# Patient Record
Sex: Male | Born: 1945 | Race: White | Hispanic: No | Smoking: Former smoker
Health system: Southern US, Community
[De-identification: ages and names within clinical notes are randomized; demographics above are authoritative.]

## PROBLEM LIST (undated history)

## (undated) DIAGNOSIS — M4802 Spinal stenosis, cervical region: Secondary | ICD-10-CM

## (undated) DIAGNOSIS — I4891 Unspecified atrial fibrillation: Secondary | ICD-10-CM

## (undated) DIAGNOSIS — I10 Essential (primary) hypertension: Secondary | ICD-10-CM

## (undated) DIAGNOSIS — M5136 Other intervertebral disc degeneration, lumbar region: Secondary | ICD-10-CM

## (undated) DIAGNOSIS — I739 Peripheral vascular disease, unspecified: Secondary | ICD-10-CM

## (undated) DIAGNOSIS — R0600 Dyspnea, unspecified: Secondary | ICD-10-CM

## (undated) DIAGNOSIS — E119 Type 2 diabetes mellitus without complications: Secondary | ICD-10-CM

## (undated) DIAGNOSIS — M5030 Other cervical disc degeneration, unspecified cervical region: Secondary | ICD-10-CM

## (undated) DIAGNOSIS — E785 Hyperlipidemia, unspecified: Secondary | ICD-10-CM

## (undated) DIAGNOSIS — M51369 Other intervertebral disc degeneration, lumbar region without mention of lumbar back pain or lower extremity pain: Secondary | ICD-10-CM

## (undated) HISTORY — DX: Spinal stenosis, cervical region: M48.02

## (undated) HISTORY — DX: Unspecified atrial fibrillation: I48.91

## (undated) HISTORY — DX: Hyperlipidemia, unspecified: E78.5

## (undated) HISTORY — PX: NOSE SURGERY: SHX723

## (undated) HISTORY — DX: Peripheral vascular disease, unspecified: I73.9

## (undated) HISTORY — DX: Other cervical disc degeneration, unspecified cervical region: M50.30

## (undated) HISTORY — DX: Other intervertebral disc degeneration, lumbar region without mention of lumbar back pain or lower extremity pain: M51.369

## (undated) HISTORY — DX: Essential (primary) hypertension: I10

## (undated) HISTORY — DX: Other intervertebral disc degeneration, lumbar region: M51.36

## (undated) HISTORY — DX: Type 2 diabetes mellitus without complications: E11.9

## (undated) HISTORY — PX: KNEE SURGERY: SHX244

---

## 2006-10-24 ENCOUNTER — Ambulatory Visit: Payer: Self-pay | Admitting: Orthopedic Surgery

## 2006-10-31 ENCOUNTER — Ambulatory Visit (HOSPITAL_COMMUNITY): Admission: RE | Admit: 2006-10-31 | Discharge: 2006-10-31 | Payer: Self-pay | Admitting: Orthopedic Surgery

## 2006-12-01 ENCOUNTER — Ambulatory Visit: Payer: Self-pay | Admitting: Orthopedic Surgery

## 2014-05-14 ENCOUNTER — Telehealth: Payer: Self-pay | Admitting: Family Medicine

## 2014-05-15 NOTE — Telephone Encounter (Signed)
Patient was on med for acid reflux, cholesterol, diabetes, xanax, percocet 10/325,- patient has not had any meds for over 2 years- He has just got out of prison and needs a family provider and patient notified that we will not prescribe any pain medications and that we would refer to pain management and not given him any pain medications. Patient understands.

## 2014-06-19 ENCOUNTER — Encounter (INDEPENDENT_AMBULATORY_CARE_PROVIDER_SITE_OTHER): Payer: Self-pay

## 2014-06-19 ENCOUNTER — Encounter: Payer: Self-pay | Admitting: Family Medicine

## 2014-06-19 ENCOUNTER — Ambulatory Visit (INDEPENDENT_AMBULATORY_CARE_PROVIDER_SITE_OTHER): Payer: Commercial Managed Care - HMO

## 2014-06-19 ENCOUNTER — Ambulatory Visit (INDEPENDENT_AMBULATORY_CARE_PROVIDER_SITE_OTHER): Payer: Commercial Managed Care - HMO | Admitting: Family Medicine

## 2014-06-19 VITALS — BP 139/69 | HR 66 | Temp 96.8°F | Ht 69.0 in | Wt 233.0 lb

## 2014-06-19 DIAGNOSIS — M545 Low back pain: Secondary | ICD-10-CM | POA: Diagnosis not present

## 2014-06-19 DIAGNOSIS — M542 Cervicalgia: Secondary | ICD-10-CM | POA: Diagnosis not present

## 2014-06-19 DIAGNOSIS — R05 Cough: Secondary | ICD-10-CM | POA: Diagnosis not present

## 2014-06-19 DIAGNOSIS — M549 Dorsalgia, unspecified: Secondary | ICD-10-CM | POA: Insufficient documentation

## 2014-06-19 DIAGNOSIS — Z23 Encounter for immunization: Secondary | ICD-10-CM | POA: Diagnosis not present

## 2014-06-19 DIAGNOSIS — E1140 Type 2 diabetes mellitus with diabetic neuropathy, unspecified: Secondary | ICD-10-CM | POA: Diagnosis not present

## 2014-06-19 DIAGNOSIS — G609 Hereditary and idiopathic neuropathy, unspecified: Secondary | ICD-10-CM | POA: Insufficient documentation

## 2014-06-19 DIAGNOSIS — R059 Cough, unspecified: Secondary | ICD-10-CM

## 2014-06-19 DIAGNOSIS — E119 Type 2 diabetes mellitus without complications: Secondary | ICD-10-CM

## 2014-06-19 LAB — POCT CBC
Granulocyte percent: 53.5 %G (ref 37–80)
HCT, POC: 43.5 % (ref 43.5–53.7)
Hemoglobin: 14.4 g/dL (ref 14.1–18.1)
Lymph, poc: 3.2 (ref 0.6–3.4)
MCH, POC: 29.6 pg (ref 27–31.2)
MCHC: 33.2 g/dL (ref 31.8–35.4)
MCV: 89.2 fL (ref 80–97)
MPV: 7.6 fL (ref 0–99.8)
POC Granulocyte: 4.1 (ref 2–6.9)
POC LYMPH PERCENT: 41.3 %L (ref 10–50)
Platelet Count, POC: 184 10*3/uL (ref 142–424)
RBC: 4.9 M/uL (ref 4.69–6.13)
RDW, POC: 13 %
WBC: 7.7 10*3/uL (ref 4.6–10.2)

## 2014-06-19 LAB — POCT GLYCOSYLATED HEMOGLOBIN (HGB A1C): Hemoglobin A1C: 11.1

## 2014-06-19 LAB — GLUCOSE, POCT (MANUAL RESULT ENTRY): POC Glucose: 285 mg/dl — AB (ref 70–99)

## 2014-06-19 MED ORDER — GABAPENTIN 300 MG PO CAPS: 300.0000 mg | ORAL_CAPSULE | Freq: Three times a day (TID) | ORAL | Status: DC

## 2014-06-19 MED ORDER — OXYCODONE HCL 5 MG PO TABS: 5.0000 mg | ORAL_TABLET | Freq: Two times a day (BID) | ORAL | Status: DC | PRN

## 2014-06-19 MED ORDER — OMEPRAZOLE 20 MG PO CPDR: 20.0000 mg | DELAYED_RELEASE_CAPSULE | Freq: Every day | ORAL | Status: DC

## 2014-06-19 NOTE — Progress Notes (Signed)
   Subjective:    Patient ID: Todd Zhang, male    DOB: 1946-03-22, 68 y.o.   MRN: [id number]  HPI  68 year old male who was recently released from prison. While in prison, has chronic medical problems or apparently neglected. He has a history of diabetes, hyperlipidemia, and chronic pains in his neck arms, low back, and legs. He also complains of a chronic cough today He brings with him reports dating back 7 or 8 years ago where he had MRI and CT studies of his neck and back which show disc disease and are the source of his chronic pain. Due to his lack of care in recent months or years we need to get some baseline labs initiate treatments and follow his progress.    Review of Systems  Constitutional: Negative.   HENT: Negative.   Eyes: Negative.   Respiratory: Positive for cough.   Cardiovascular: Negative.   Gastrointestinal: Negative.   Endocrine: Negative.   Musculoskeletal: Positive for back pain.  Psychiatric/Behavioral: Negative.        Objective:   Physical Exam  Constitutional: He is oriented to person, place, and time. He appears well-developed.  HENT:  Head: Normocephalic.  Eyes: Pupils are equal, round, and reactive to light.  Neck: Normal range of motion.  Cardiovascular: Regular rhythm and intact distal pulses.   Abdominal: There is tenderness.  Musculoskeletal: Normal range of motion.  Neurological: He is alert and oriented to person, place, and time. He has normal reflexes.  Skin: Skin is warm.   BP 139/69  Pulse 66  Temp(Src) 96.8 F (36 C) (Oral)  Ht 5' 9" (1.753 m)  Wt 233 lb (105.688 kg)  BMI 34.39 kg/m2       Assessment & Plan:  1. Type 2 diabetes mellitus with diabetic neuropathy Will initiate treatment once labs are back - Lipid panel - POCT glycosylated hemoglobin (Hb A1C) - CMP14+EGFR - POCT glucose (manual entry)  2. Cough CXR looks clear Suspect acid/reflux or pnd - DG Chest 2 View; Future - POCT CBC  3. Encounter for  immunization   4. Low back pain without sciatica, unspecified back pain laterality Will treat with oxycodone.  Stressed importance of trust and taking meds as prescribed  5. Neck pain Same for back pain  6. Hereditary and idiopathic peripheral neuropathy Begin Gabapentin; probale related to diabetes per history  Wardell Honour MD

## 2014-06-20 LAB — CMP14+EGFR
ALT: 64 IU/L — ABNORMAL HIGH (ref 0–44)
AST: 26 IU/L (ref 0–40)
Albumin/Globulin Ratio: 1.7 (ref 1.1–2.5)
Albumin: 4.3 g/dL (ref 3.6–4.8)
Alkaline Phosphatase: 102 IU/L (ref 39–117)
BUN/Creatinine Ratio: 12 (ref 10–22)
BUN: 10 mg/dL (ref 8–27)
CO2: 27 mmol/L (ref 18–29)
Calcium: 9.5 mg/dL (ref 8.6–10.2)
Chloride: 95 mmol/L — ABNORMAL LOW (ref 97–108)
Creatinine, Ser: 0.82 mg/dL (ref 0.76–1.27)
GFR calc Af Amer: 105 mL/min/{1.73_m2} (ref >59)
GFR calc non Af Amer: 91 mL/min/{1.73_m2} (ref >59)
Globulin, Total: 2.5 g/dL (ref 1.5–4.5)
Glucose: 303 mg/dL — ABNORMAL HIGH (ref 65–99)
Potassium: 4.8 mmol/L (ref 3.5–5.2)
Sodium: 136 mmol/L (ref 134–144)
Total Bilirubin: 0.4 mg/dL (ref 0.0–1.2)
Total Protein: 6.8 g/dL (ref 6.0–8.5)

## 2014-06-20 LAB — LIPID PANEL
Chol/HDL Ratio: 10.4 ratio units — ABNORMAL HIGH (ref 0.0–5.0)
Cholesterol, Total: 376 mg/dL — ABNORMAL HIGH (ref 100–199)
HDL: 36 mg/dL — ABNORMAL LOW (ref >39)
Triglycerides: 779 mg/dL (ref 0–149)

## 2014-06-26 ENCOUNTER — Telehealth: Payer: Self-pay | Admitting: Family Medicine

## 2014-06-26 NOTE — Telephone Encounter (Signed)
Message copied by Azalee CourseFULP, Marlin Brys on Wed Jun 26, 2014  2:29 PM ------      Message from: Frederica KusterMILLER, STEPHEN M      Created: Thu Jun 20, 2014  7:37 AM       Begin Metformin 1000mg  BID with breAKFAST AND SUPPER  Begin Tricor,145 mg q day; recheck 2 months ------

## 2014-07-04 ENCOUNTER — Telehealth: Payer: Self-pay | Admitting: *Deleted

## 2014-07-04 NOTE — Telephone Encounter (Signed)
Message copied by Almeta MonasSTONE, Dawayne Ohair M on Thu Jul 04, 2014 12:05 PM ------      Message from: Frederica KusterMILLER, STEPHEN M      Created: Thu Jun 20, 2014  7:37 AM       Begin Metformin 1000mg  BID with breAKFAST AND SUPPER  Begin Tricor,145 mg q day; recheck 2 months ------

## 2014-07-04 NOTE — Telephone Encounter (Signed)
Patient aware of results on labs and also to begin two new medications .  Scripts for metformin 1000mg , bid with meal and tricor 145mg  daily were [facility] at  [facility].

## 2014-07-08 ENCOUNTER — Telehealth: Payer: Self-pay | Admitting: Family Medicine

## 2014-07-08 NOTE — Telephone Encounter (Signed)
Call for appt  If does not feel better tomorrow. He says this is nothing new. He was not asking for more meds.

## 2014-08-15 ENCOUNTER — Other Ambulatory Visit: Payer: Self-pay | Admitting: *Deleted

## 2014-08-15 ENCOUNTER — Ambulatory Visit: Payer: Commercial Managed Care - HMO | Admitting: Family Medicine

## 2015-08-28 DIAGNOSIS — Z9289 Personal history of other medical treatment: Secondary | ICD-10-CM | POA: Insufficient documentation

## 2015-08-28 DIAGNOSIS — Z9889 Other specified postprocedural states: Secondary | ICD-10-CM | POA: Insufficient documentation

## 2015-09-17 DIAGNOSIS — I10 Essential (primary) hypertension: Secondary | ICD-10-CM | POA: Diagnosis not present

## 2015-09-17 DIAGNOSIS — E11610 Type 2 diabetes mellitus with diabetic neuropathic arthropathy: Secondary | ICD-10-CM | POA: Diagnosis not present

## 2015-10-07 DIAGNOSIS — G4733 Obstructive sleep apnea (adult) (pediatric): Secondary | ICD-10-CM | POA: Diagnosis not present

## 2015-10-16 DIAGNOSIS — M810 Age-related osteoporosis without current pathological fracture: Secondary | ICD-10-CM | POA: Diagnosis not present

## 2015-10-16 DIAGNOSIS — E784 Other hyperlipidemia: Secondary | ICD-10-CM | POA: Diagnosis not present

## 2015-10-16 DIAGNOSIS — I10 Essential (primary) hypertension: Secondary | ICD-10-CM | POA: Diagnosis not present

## 2015-10-16 DIAGNOSIS — E11610 Type 2 diabetes mellitus with diabetic neuropathic arthropathy: Secondary | ICD-10-CM | POA: Diagnosis not present

## 2015-11-07 DIAGNOSIS — G4733 Obstructive sleep apnea (adult) (pediatric): Secondary | ICD-10-CM | POA: Diagnosis not present

## 2015-11-12 DIAGNOSIS — G4733 Obstructive sleep apnea (adult) (pediatric): Secondary | ICD-10-CM | POA: Diagnosis not present

## 2015-12-05 DIAGNOSIS — G4733 Obstructive sleep apnea (adult) (pediatric): Secondary | ICD-10-CM | POA: Diagnosis not present

## 2016-01-05 DIAGNOSIS — G4733 Obstructive sleep apnea (adult) (pediatric): Secondary | ICD-10-CM | POA: Diagnosis not present

## 2016-01-15 DIAGNOSIS — Z0000 Encounter for general adult medical examination without abnormal findings: Secondary | ICD-10-CM | POA: Diagnosis not present

## 2016-01-15 DIAGNOSIS — E784 Other hyperlipidemia: Secondary | ICD-10-CM | POA: Diagnosis not present

## 2016-01-15 DIAGNOSIS — I10 Essential (primary) hypertension: Secondary | ICD-10-CM | POA: Diagnosis not present

## 2016-01-15 DIAGNOSIS — E11610 Type 2 diabetes mellitus with diabetic neuropathic arthropathy: Secondary | ICD-10-CM | POA: Diagnosis not present

## 2016-02-04 DIAGNOSIS — G4733 Obstructive sleep apnea (adult) (pediatric): Secondary | ICD-10-CM | POA: Diagnosis not present

## 2016-03-06 DIAGNOSIS — G4733 Obstructive sleep apnea (adult) (pediatric): Secondary | ICD-10-CM | POA: Diagnosis not present

## 2016-03-07 DIAGNOSIS — G4733 Obstructive sleep apnea (adult) (pediatric): Secondary | ICD-10-CM | POA: Diagnosis not present

## 2016-04-06 DIAGNOSIS — G4733 Obstructive sleep apnea (adult) (pediatric): Secondary | ICD-10-CM | POA: Diagnosis not present

## 2016-04-16 DIAGNOSIS — Z125 Encounter for screening for malignant neoplasm of prostate: Secondary | ICD-10-CM | POA: Diagnosis not present

## 2016-04-16 DIAGNOSIS — E11610 Type 2 diabetes mellitus with diabetic neuropathic arthropathy: Secondary | ICD-10-CM | POA: Diagnosis not present

## 2016-04-16 DIAGNOSIS — Z1389 Encounter for screening for other disorder: Secondary | ICD-10-CM | POA: Diagnosis not present

## 2016-04-16 DIAGNOSIS — I10 Essential (primary) hypertension: Secondary | ICD-10-CM | POA: Diagnosis not present

## 2016-04-16 DIAGNOSIS — E784 Other hyperlipidemia: Secondary | ICD-10-CM | POA: Diagnosis not present

## 2016-04-16 DIAGNOSIS — Z0000 Encounter for general adult medical examination without abnormal findings: Secondary | ICD-10-CM | POA: Diagnosis not present

## 2016-05-07 DIAGNOSIS — G4733 Obstructive sleep apnea (adult) (pediatric): Secondary | ICD-10-CM | POA: Diagnosis not present

## 2016-06-07 DIAGNOSIS — G4733 Obstructive sleep apnea (adult) (pediatric): Secondary | ICD-10-CM | POA: Diagnosis not present

## 2016-07-07 DIAGNOSIS — G4733 Obstructive sleep apnea (adult) (pediatric): Secondary | ICD-10-CM | POA: Diagnosis not present

## 2016-09-27 ENCOUNTER — Telehealth: Payer: Self-pay

## 2016-09-27 NOTE — Telephone Encounter (Signed)
Patient aware.

## 2016-09-28 DIAGNOSIS — N189 Chronic kidney disease, unspecified: Secondary | ICD-10-CM | POA: Diagnosis not present

## 2016-09-28 DIAGNOSIS — R404 Transient alteration of awareness: Secondary | ICD-10-CM | POA: Diagnosis not present

## 2016-09-28 DIAGNOSIS — I480 Paroxysmal atrial fibrillation: Secondary | ICD-10-CM | POA: Diagnosis not present

## 2016-09-28 DIAGNOSIS — R531 Weakness: Secondary | ICD-10-CM | POA: Diagnosis not present

## 2016-09-28 DIAGNOSIS — I482 Chronic atrial fibrillation: Secondary | ICD-10-CM | POA: Diagnosis not present

## 2016-09-29 DIAGNOSIS — Z7901 Long term (current) use of anticoagulants: Secondary | ICD-10-CM | POA: Diagnosis not present

## 2016-09-29 DIAGNOSIS — E1169 Type 2 diabetes mellitus with other specified complication: Secondary | ICD-10-CM | POA: Diagnosis not present

## 2016-09-29 DIAGNOSIS — M86672 Other chronic osteomyelitis, left ankle and foot: Secondary | ICD-10-CM | POA: Diagnosis not present

## 2016-09-29 DIAGNOSIS — E11621 Type 2 diabetes mellitus with foot ulcer: Secondary | ICD-10-CM | POA: Diagnosis not present

## 2016-09-29 DIAGNOSIS — R05 Cough: Secondary | ICD-10-CM | POA: Diagnosis not present

## 2016-09-29 DIAGNOSIS — I96 Gangrene, not elsewhere classified: Secondary | ICD-10-CM | POA: Diagnosis not present

## 2016-09-29 DIAGNOSIS — E7800 Pure hypercholesterolemia, unspecified: Secondary | ICD-10-CM | POA: Diagnosis not present

## 2016-09-29 DIAGNOSIS — R0789 Other chest pain: Secondary | ICD-10-CM | POA: Diagnosis not present

## 2016-09-29 DIAGNOSIS — E1142 Type 2 diabetes mellitus with diabetic polyneuropathy: Secondary | ICD-10-CM | POA: Diagnosis not present

## 2016-09-29 DIAGNOSIS — M86172 Other acute osteomyelitis, left ankle and foot: Secondary | ICD-10-CM | POA: Diagnosis not present

## 2016-09-29 DIAGNOSIS — K219 Gastro-esophageal reflux disease without esophagitis: Secondary | ICD-10-CM | POA: Diagnosis not present

## 2016-09-29 DIAGNOSIS — Z23 Encounter for immunization: Secondary | ICD-10-CM | POA: Diagnosis not present

## 2016-09-29 DIAGNOSIS — Z794 Long term (current) use of insulin: Secondary | ICD-10-CM | POA: Diagnosis not present

## 2016-09-29 DIAGNOSIS — R319 Hematuria, unspecified: Secondary | ICD-10-CM | POA: Diagnosis not present

## 2016-09-29 DIAGNOSIS — M868X7 Other osteomyelitis, ankle and foot: Secondary | ICD-10-CM | POA: Diagnosis not present

## 2016-09-29 DIAGNOSIS — R197 Diarrhea, unspecified: Secondary | ICD-10-CM | POA: Diagnosis not present

## 2016-09-29 DIAGNOSIS — E119 Type 2 diabetes mellitus without complications: Secondary | ICD-10-CM | POA: Diagnosis not present

## 2016-09-29 DIAGNOSIS — I4891 Unspecified atrial fibrillation: Secondary | ICD-10-CM | POA: Diagnosis not present

## 2016-09-29 DIAGNOSIS — Z87891 Personal history of nicotine dependence: Secondary | ICD-10-CM | POA: Diagnosis not present

## 2016-09-29 DIAGNOSIS — Z886 Allergy status to analgesic agent status: Secondary | ICD-10-CM | POA: Diagnosis not present

## 2016-09-29 DIAGNOSIS — Z79899 Other long term (current) drug therapy: Secondary | ICD-10-CM | POA: Diagnosis not present

## 2016-09-29 DIAGNOSIS — I482 Chronic atrial fibrillation: Secondary | ICD-10-CM | POA: Diagnosis not present

## 2016-09-29 DIAGNOSIS — I480 Paroxysmal atrial fibrillation: Secondary | ICD-10-CM | POA: Diagnosis not present

## 2016-09-29 DIAGNOSIS — G4730 Sleep apnea, unspecified: Secondary | ICD-10-CM | POA: Diagnosis not present

## 2016-09-29 DIAGNOSIS — G8194 Hemiplegia, unspecified affecting left nondominant side: Secondary | ICD-10-CM | POA: Diagnosis not present

## 2016-09-29 DIAGNOSIS — Z79891 Long term (current) use of opiate analgesic: Secondary | ICD-10-CM | POA: Diagnosis not present

## 2016-09-29 DIAGNOSIS — N189 Chronic kidney disease, unspecified: Secondary | ICD-10-CM | POA: Diagnosis not present

## 2016-09-29 DIAGNOSIS — Z880 Allergy status to penicillin: Secondary | ICD-10-CM | POA: Diagnosis not present

## 2016-09-29 DIAGNOSIS — Z888 Allergy status to other drugs, medicaments and biological substances status: Secondary | ICD-10-CM | POA: Diagnosis not present

## 2016-09-29 DIAGNOSIS — L97529 Non-pressure chronic ulcer of other part of left foot with unspecified severity: Secondary | ICD-10-CM | POA: Diagnosis not present

## 2016-09-29 DIAGNOSIS — E1152 Type 2 diabetes mellitus with diabetic peripheral angiopathy with gangrene: Secondary | ICD-10-CM | POA: Diagnosis not present

## 2016-10-04 DIAGNOSIS — Z23 Encounter for immunization: Secondary | ICD-10-CM | POA: Diagnosis not present

## 2016-10-28 DIAGNOSIS — T8740 Infection of amputation stump, unspecified extremity: Secondary | ICD-10-CM | POA: Diagnosis not present

## 2016-10-28 DIAGNOSIS — Z0000 Encounter for general adult medical examination without abnormal findings: Secondary | ICD-10-CM | POA: Diagnosis not present

## 2016-10-28 DIAGNOSIS — E118 Type 2 diabetes mellitus with unspecified complications: Secondary | ICD-10-CM | POA: Diagnosis not present

## 2016-10-28 DIAGNOSIS — G8929 Other chronic pain: Secondary | ICD-10-CM | POA: Diagnosis not present

## 2016-10-28 DIAGNOSIS — Z794 Long term (current) use of insulin: Secondary | ICD-10-CM | POA: Diagnosis not present

## 2016-11-01 DIAGNOSIS — Z89422 Acquired absence of other left toe(s): Secondary | ICD-10-CM | POA: Diagnosis not present

## 2016-11-01 DIAGNOSIS — L97519 Non-pressure chronic ulcer of other part of right foot with unspecified severity: Secondary | ICD-10-CM | POA: Diagnosis not present

## 2016-11-01 DIAGNOSIS — M8668 Other chronic osteomyelitis, other site: Secondary | ICD-10-CM | POA: Diagnosis not present

## 2016-11-01 DIAGNOSIS — E1140 Type 2 diabetes mellitus with diabetic neuropathy, unspecified: Secondary | ICD-10-CM | POA: Diagnosis not present

## 2016-11-01 DIAGNOSIS — Z87891 Personal history of nicotine dependence: Secondary | ICD-10-CM | POA: Diagnosis not present

## 2016-11-01 DIAGNOSIS — E1122 Type 2 diabetes mellitus with diabetic chronic kidney disease: Secondary | ICD-10-CM | POA: Diagnosis not present

## 2016-11-01 DIAGNOSIS — N189 Chronic kidney disease, unspecified: Secondary | ICD-10-CM | POA: Diagnosis not present

## 2016-11-01 DIAGNOSIS — M868X8 Other osteomyelitis, other site: Secondary | ICD-10-CM | POA: Diagnosis not present

## 2016-11-01 DIAGNOSIS — I4891 Unspecified atrial fibrillation: Secondary | ICD-10-CM | POA: Diagnosis not present

## 2016-11-01 DIAGNOSIS — E118 Type 2 diabetes mellitus with unspecified complications: Secondary | ICD-10-CM | POA: Diagnosis not present

## 2016-11-01 DIAGNOSIS — Z886 Allergy status to analgesic agent status: Secondary | ICD-10-CM | POA: Diagnosis not present

## 2016-11-01 DIAGNOSIS — E11621 Type 2 diabetes mellitus with foot ulcer: Secondary | ICD-10-CM | POA: Diagnosis not present

## 2016-11-01 DIAGNOSIS — Z880 Allergy status to penicillin: Secondary | ICD-10-CM | POA: Diagnosis not present

## 2016-11-01 DIAGNOSIS — I129 Hypertensive chronic kidney disease with stage 1 through stage 4 chronic kidney disease, or unspecified chronic kidney disease: Secondary | ICD-10-CM | POA: Diagnosis not present

## 2016-11-01 DIAGNOSIS — L97522 Non-pressure chronic ulcer of other part of left foot with fat layer exposed: Secondary | ICD-10-CM | POA: Diagnosis not present

## 2016-11-01 DIAGNOSIS — G8929 Other chronic pain: Secondary | ICD-10-CM | POA: Diagnosis not present

## 2016-11-01 DIAGNOSIS — M1990 Unspecified osteoarthritis, unspecified site: Secondary | ICD-10-CM | POA: Diagnosis not present

## 2016-11-01 DIAGNOSIS — E782 Mixed hyperlipidemia: Secondary | ICD-10-CM | POA: Diagnosis not present

## 2016-11-01 DIAGNOSIS — Z794 Long term (current) use of insulin: Secondary | ICD-10-CM | POA: Diagnosis not present

## 2016-11-01 DIAGNOSIS — L97529 Non-pressure chronic ulcer of other part of left foot with unspecified severity: Secondary | ICD-10-CM | POA: Diagnosis not present

## 2016-11-01 DIAGNOSIS — I10 Essential (primary) hypertension: Secondary | ICD-10-CM | POA: Diagnosis not present

## 2016-11-01 DIAGNOSIS — Z0000 Encounter for general adult medical examination without abnormal findings: Secondary | ICD-10-CM | POA: Diagnosis not present

## 2016-11-02 DIAGNOSIS — I10 Essential (primary) hypertension: Secondary | ICD-10-CM | POA: Diagnosis not present

## 2016-11-02 DIAGNOSIS — E11621 Type 2 diabetes mellitus with foot ulcer: Secondary | ICD-10-CM | POA: Diagnosis not present

## 2016-11-02 DIAGNOSIS — Z4801 Encounter for change or removal of surgical wound dressing: Secondary | ICD-10-CM | POA: Diagnosis not present

## 2016-11-04 DIAGNOSIS — G8929 Other chronic pain: Secondary | ICD-10-CM | POA: Diagnosis not present

## 2016-11-04 DIAGNOSIS — T8740 Infection of amputation stump, unspecified extremity: Secondary | ICD-10-CM | POA: Diagnosis not present

## 2016-11-04 DIAGNOSIS — Z794 Long term (current) use of insulin: Secondary | ICD-10-CM | POA: Diagnosis not present

## 2016-11-04 DIAGNOSIS — I10 Essential (primary) hypertension: Secondary | ICD-10-CM | POA: Diagnosis not present

## 2016-11-04 DIAGNOSIS — E118 Type 2 diabetes mellitus with unspecified complications: Secondary | ICD-10-CM | POA: Diagnosis not present

## 2016-11-05 DIAGNOSIS — E11621 Type 2 diabetes mellitus with foot ulcer: Secondary | ICD-10-CM | POA: Diagnosis not present

## 2016-11-05 DIAGNOSIS — I10 Essential (primary) hypertension: Secondary | ICD-10-CM | POA: Diagnosis not present

## 2016-11-05 DIAGNOSIS — Z4801 Encounter for change or removal of surgical wound dressing: Secondary | ICD-10-CM | POA: Diagnosis not present

## 2016-11-09 DIAGNOSIS — E11621 Type 2 diabetes mellitus with foot ulcer: Secondary | ICD-10-CM | POA: Diagnosis not present

## 2016-11-09 DIAGNOSIS — Z4801 Encounter for change or removal of surgical wound dressing: Secondary | ICD-10-CM | POA: Diagnosis not present

## 2016-11-09 DIAGNOSIS — I10 Essential (primary) hypertension: Secondary | ICD-10-CM | POA: Diagnosis not present

## 2016-11-10 DIAGNOSIS — Z4801 Encounter for change or removal of surgical wound dressing: Secondary | ICD-10-CM | POA: Diagnosis not present

## 2016-11-10 DIAGNOSIS — I10 Essential (primary) hypertension: Secondary | ICD-10-CM | POA: Diagnosis not present

## 2016-11-10 DIAGNOSIS — E11621 Type 2 diabetes mellitus with foot ulcer: Secondary | ICD-10-CM | POA: Diagnosis not present

## 2017-04-04 ENCOUNTER — Other Ambulatory Visit (HOSPITAL_COMMUNITY): Payer: Self-pay | Admitting: Nurse Practitioner

## 2017-04-04 DIAGNOSIS — G4452 New daily persistent headache (NDPH): Secondary | ICD-10-CM

## 2017-04-06 ENCOUNTER — Ambulatory Visit (HOSPITAL_COMMUNITY)
Admission: RE | Admit: 2017-04-06 | Discharge: 2017-04-06 | Disposition: A | Discharge status: Home / Self Care | Payer: Medicare Other | Source: Ambulatory Visit | Attending: Nurse Practitioner | Admitting: Nurse Practitioner

## 2017-04-06 DIAGNOSIS — G4452 New daily persistent headache (NDPH): Secondary | ICD-10-CM | POA: Diagnosis present

## 2017-04-06 DIAGNOSIS — X58XXXA Exposure to other specified factors, initial encounter: Secondary | ICD-10-CM | POA: Insufficient documentation

## 2017-04-06 DIAGNOSIS — S0990XA Unspecified injury of head, initial encounter: Secondary | ICD-10-CM | POA: Insufficient documentation

## 2017-04-06 DIAGNOSIS — R413 Other amnesia: Secondary | ICD-10-CM | POA: Insufficient documentation

## 2017-04-06 LAB — POCT I-STAT CREATININE: Creatinine, Ser: 1.3 mg/dL — ABNORMAL HIGH (ref 0.61–1.24)

## 2017-04-06 MED ORDER — GADOBENATE DIMEGLUMINE 529 MG/ML IV SOLN
20.0000 mL | Freq: Once | INTRAVENOUS | Status: AC | PRN
  Administered 2017-04-06: 20 mL via INTRAVENOUS

## 2017-05-17 ENCOUNTER — Other Ambulatory Visit (HOSPITAL_COMMUNITY): Payer: Self-pay | Admitting: Nurse Practitioner

## 2017-05-17 DIAGNOSIS — R911 Solitary pulmonary nodule: Secondary | ICD-10-CM

## 2017-05-25 ENCOUNTER — Ambulatory Visit (HOSPITAL_COMMUNITY)
Admission: RE | Admit: 2017-05-25 | Discharge: 2017-05-25 | Disposition: A | Discharge status: Home / Self Care | Payer: Medicare Other | Source: Ambulatory Visit | Attending: Nurse Practitioner | Admitting: Nurse Practitioner

## 2017-05-25 DIAGNOSIS — R911 Solitary pulmonary nodule: Secondary | ICD-10-CM

## 2017-05-25 DIAGNOSIS — I2510 Atherosclerotic heart disease of native coronary artery without angina pectoris: Secondary | ICD-10-CM | POA: Insufficient documentation

## 2017-05-25 DIAGNOSIS — I700 Atherosclerosis of aorta: Secondary | ICD-10-CM | POA: Insufficient documentation

## 2017-05-25 LAB — POCT I-STAT CREATININE: Creatinine, Ser: 1.3 mg/dL — ABNORMAL HIGH (ref 0.61–1.24)

## 2017-05-25 MED ORDER — IOPAMIDOL (ISOVUE-300) INJECTION 61%
75.0000 mL | Freq: Once | INTRAVENOUS | Status: AC | PRN
  Administered 2017-05-25: 75 mL via INTRAVENOUS

## 2017-08-24 ENCOUNTER — Ambulatory Visit: Payer: Self-pay | Admitting: Physician Assistant

## 2017-09-01 ENCOUNTER — Encounter: Payer: Self-pay | Admitting: Physician Assistant

## 2017-09-01 ENCOUNTER — Other Ambulatory Visit: Payer: Self-pay | Admitting: Physician Assistant

## 2017-09-01 ENCOUNTER — Ambulatory Visit (INDEPENDENT_AMBULATORY_CARE_PROVIDER_SITE_OTHER): Payer: Medicare Other | Admitting: Physician Assistant

## 2017-09-01 VITALS — BP 126/71 | HR 88 | Temp 97.1°F | Ht 69.0 in | Wt 232.0 lb

## 2017-09-01 DIAGNOSIS — I4891 Unspecified atrial fibrillation: Secondary | ICD-10-CM | POA: Insufficient documentation

## 2017-09-01 DIAGNOSIS — Z794 Long term (current) use of insulin: Secondary | ICD-10-CM | POA: Diagnosis not present

## 2017-09-01 DIAGNOSIS — K219 Gastro-esophageal reflux disease without esophagitis: Secondary | ICD-10-CM

## 2017-09-01 DIAGNOSIS — G609 Hereditary and idiopathic neuropathy, unspecified: Secondary | ICD-10-CM

## 2017-09-01 DIAGNOSIS — B351 Tinea unguium: Secondary | ICD-10-CM | POA: Diagnosis not present

## 2017-09-01 DIAGNOSIS — E1140 Type 2 diabetes mellitus with diabetic neuropathy, unspecified: Secondary | ICD-10-CM | POA: Diagnosis not present

## 2017-09-01 DIAGNOSIS — I482 Chronic atrial fibrillation, unspecified: Secondary | ICD-10-CM

## 2017-09-01 DIAGNOSIS — I10 Essential (primary) hypertension: Secondary | ICD-10-CM | POA: Insufficient documentation

## 2017-09-01 LAB — BAYER DCA HB A1C WAIVED: HB A1C (BAYER DCA - WAIVED): 7 % — ABNORMAL HIGH (ref <7.0)

## 2017-09-01 MED ORDER — ALPRAZOLAM 0.25 MG PO TABS: 0.2500 mg | ORAL_TABLET | Freq: Every day | ORAL | 1 refills | Status: DC

## 2017-09-01 MED ORDER — OXYCODONE-ACETAMINOPHEN 7.5-325 MG PO TABS
1.0000 | ORAL_TABLET | ORAL | 0 refills | Status: DC | PRN
Start: 2017-09-01 — End: 2017-09-30

## 2017-09-01 NOTE — Patient Instructions (Signed)
In a few days you may receive a survey in the mail or online from [employer] regarding your visit with us today. Please take a moment to fill this out. Your feedback is very important to our whole office. It can help us better understand your needs as well as improve your experience and satisfaction. Thank you for taking your time to complete it. We care about you.  Navy Belay, PA-C  

## 2017-09-02 LAB — CMP14+EGFR
ALT: 19 IU/L (ref 0–44)
AST: 23 IU/L (ref 0–40)
Albumin/Globulin Ratio: 2 (ref 1.2–2.2)
Albumin: 4.5 g/dL (ref 3.5–4.8)
Alkaline Phosphatase: 60 IU/L (ref 39–117)
BUN/Creatinine Ratio: 20 (ref 10–24)
BUN: 25 mg/dL (ref 8–27)
Bilirubin Total: 0.5 mg/dL (ref 0.0–1.2)
CO2: 24 mmol/L (ref 20–29)
Calcium: 9.4 mg/dL (ref 8.6–10.2)
Chloride: 102 mmol/L (ref 96–106)
Creatinine, Ser: 1.25 mg/dL (ref 0.76–1.27)
GFR calc Af Amer: 66 mL/min/{1.73_m2} (ref >59)
GFR calc non Af Amer: 57 mL/min/{1.73_m2} — ABNORMAL LOW (ref >59)
Globulin, Total: 2.2 g/dL (ref 1.5–4.5)
Glucose: 159 mg/dL — ABNORMAL HIGH (ref 65–99)
Potassium: 4.3 mmol/L (ref 3.5–5.2)
Sodium: 139 mmol/L (ref 134–144)
Total Protein: 6.7 g/dL (ref 6.0–8.5)

## 2017-09-02 LAB — CBC WITH DIFFERENTIAL/PLATELET
Basophils Absolute: 0 10*3/uL (ref 0.0–0.2)
Basos: 0 %
EOS (ABSOLUTE): 0.1 10*3/uL (ref 0.0–0.4)
Eos: 1 %
Hematocrit: 39.6 % (ref 37.5–51.0)
Hemoglobin: 13.5 g/dL (ref 13.0–17.7)
Immature Grans (Abs): 0 10*3/uL (ref 0.0–0.1)
Immature Granulocytes: 0 %
Lymphocytes Absolute: 3.6 10*3/uL — ABNORMAL HIGH (ref 0.7–3.1)
Lymphs: 49 %
MCH: 31.8 pg (ref 26.6–33.0)
MCHC: 34.1 g/dL (ref 31.5–35.7)
MCV: 93 fL (ref 79–97)
Monocytes Absolute: 0.4 10*3/uL (ref 0.1–0.9)
Monocytes: 5 %
Neutrophils Absolute: 3.3 10*3/uL (ref 1.4–7.0)
Neutrophils: 45 %
Platelets: 191 10*3/uL (ref 150–379)
RBC: 4.24 x10E6/uL (ref 4.14–5.80)
RDW: 14.2 % (ref 12.3–15.4)
WBC: 7.4 10*3/uL (ref 3.4–10.8)

## 2017-09-07 DIAGNOSIS — K219 Gastro-esophageal reflux disease without esophagitis: Secondary | ICD-10-CM | POA: Insufficient documentation

## 2017-09-07 NOTE — Progress Notes (Signed)
BP 126/71   Pulse 88   Temp (!) 97.1 F (36.2 C) (Oral)   Ht '5\' 9"'  (1.753 m)   Wt 232 lb (105.2 kg)   BMI 34.26 kg/m    Subjective:    Patient ID: Todd Zhang, male    DOB: Jul 15, 1946, 71 y.o.   MRN: [id number]  HPI: Todd Zhang is a 71 y.o. male presenting on 09/01/2017 for New Patient (Initial Visit) and Establish Care This patient comes in to establish care.  He has been seen through family practice office in [place].  Many years ago he was seen by Dr. Sabra Heck in this office.  He has lots of medical issues.  His predominant ones are his diabetes with neuropathy, atrial fibrillation, toe deformity, GERD, hypertension.  His charts are reviewed.  He has asked about stopping his anticoagulation medicine.  We have had a long discussion about atrial fibrillation and what he could have as an outcome if he stopped the anticoagulation therapy.  We discussed stroke or other clotting issues of his arms and legs and lungs.  He agrees to continue the medication.  He will continue with his cardiologist.  He does have significant thickening of his nails.  Due to his neuropathy and diabetes I would like for him to start having diabetic foot care with a podiatrist.  Referral will be made.  He also has long-term back pain.  We are going to slowly work on this.  And make referrals as needed.  We will plan to have him back in 4 weeks.   Relevant past medical, surgical, family and social history reviewed and updated as indicated. Allergies and medications reviewed and updated.  Past Medical History:  Diagnosis Date  . Diabetes mellitus without complication ([place])   . Hyperlipidemia   . Hypertension     Past Surgical History:  Procedure Laterality Date  . AMPUTATION TOE    . KNEE SURGERY Left   . NOSE SURGERY      Review of Systems  Constitutional: Positive for fatigue. Negative for appetite change, chills and diaphoresis.  HENT: Negative.   Eyes: Negative.  Negative for pain and visual  disturbance.  Respiratory: Negative.  Negative for cough, chest tightness, shortness of breath and wheezing.   Cardiovascular: Positive for palpitations. Negative for chest pain and leg swelling.  Gastrointestinal: Negative.  Negative for abdominal pain, diarrhea, nausea and vomiting.  Endocrine: Positive for polydipsia and polyphagia.  Genitourinary: Negative.   Musculoskeletal: Positive for arthralgias, back pain and myalgias.  Skin: Positive for color change and wound. Negative for rash.  Neurological: Positive for weakness. Negative for dizziness, syncope, light-headedness, numbness and headaches.  Psychiatric/Behavioral: Negative.     Allergies as of 09/01/2017      Reactions   Asa [aspirin] Hives   Penicillins Hives      Medication List        Accurate as of 09/01/17 11:59 PM. Always use your most recent med list.          alendronate 70 MG tablet Commonly known as:  FOSAMAX Take 70 mg by mouth once a week.   ALPRAZolam 0.25 MG tablet Commonly known as:  XANAX Take 1 tablet (0.25 mg total) by mouth at bedtime.   atorvastatin 40 MG tablet Commonly known as:  LIPITOR Take 40 mg by mouth daily.   benazepril 5 MG tablet Commonly known as:  LOTENSIN Take 5 mg by mouth daily.   cyclobenzaprine 10 MG tablet Commonly known as:  FLEXERIL Take 10 mg by mouth.   gemfibrozil 600 MG tablet Commonly known as:  LOPID Take 600 mg by mouth 2 (two) times daily.   GLOBAL INJECT EASE INSULIN SYR 31G X 5/16" 0.3 ML Misc Generic drug:  Insulin Syringe-Needle U-100 INJECT THREE TIMES DAILY   HUMULIN R 100 units/mL injection Generic drug:  insulin regular INJECT 10 UNITS THREE TIMES DAILY WITH MEALS   metFORMIN 500 MG tablet Commonly known as:  GLUCOPHAGE Take 500 mg by mouth 2 (two) times daily.   metoprolol succinate 50 MG 24 hr tablet Commonly known as:  TOPROL-XL Take 50 mg by mouth daily.   omega-3 acid ethyl esters 1 g capsule Commonly known as:  LOVAZA Take 1  capsule by mouth 2 (two) times daily.   oxyCODONE-acetaminophen 5-325 MG tablet Commonly known as:  PERCOCET/ROXICET Take by mouth.   oxyCODONE-acetaminophen 7.5-325 MG tablet Commonly known as:  PERCOCET Take 1 tablet by mouth every 4 (four) hours as needed for severe pain.   ranitidine 300 MG tablet Commonly known as:  ZANTAC Take 300 mg by mouth daily.   XARELTO 20 MG Tabs tablet Generic drug:  rivaroxaban Take 20 mg by mouth daily.          Objective:    BP 126/71   Pulse 88   Temp (!) 97.1 F (36.2 C) (Oral)   Ht '5\' 9"'  (1.753 m)   Wt 232 lb (105.2 kg)   BMI 34.26 kg/m   Allergies  Allergen Reactions  . Asa [Aspirin] Hives  . Penicillins Hives    Physical Exam  Constitutional: He appears well-developed and well-nourished.  HENT:  Head: Normocephalic and atraumatic.  Eyes: Conjunctivae and EOM are normal. Pupils are equal, round, and reactive to light.  Neck: Normal range of motion. Neck supple.  Cardiovascular: Normal rate, regular rhythm and normal heart sounds.  Pulmonary/Chest: Effort normal and breath sounds normal.  Abdominal: Soft. Bowel sounds are normal.  Musculoskeletal:       Right foot: There is decreased range of motion and deformity.       Left foot: There is tenderness, bony tenderness, swelling and deformity.       Feet:  Skin: Skin is warm and dry.    Results for orders placed or performed in visit on 09/01/17  CBC with Differential/Platelet  Result Value Ref Range   WBC 7.4 3.4 - 10.8 x10E3/uL   RBC 4.24 4.14 - 5.80 x10E6/uL   Hemoglobin 13.5 13.0 - 17.7 g/dL   Hematocrit 39.6 37.5 - 51.0 %   MCV 93 79 - 97 fL   MCH 31.8 26.6 - 33.0 pg   MCHC 34.1 31.5 - 35.7 g/dL   RDW 14.2 12.3 - 15.4 %   Platelets 191 150 - 379 x10E3/uL   Neutrophils 45 Not Estab. %   Lymphs 49 Not Estab. %   Monocytes 5 Not Estab. %   Eos 1 Not Estab. %   Basos 0 Not Estab. %   Neutrophils Absolute 3.3 1.4 - 7.0 x10E3/uL   Lymphocytes Absolute 3.6 (H)  0.7 - 3.1 x10E3/uL   Monocytes Absolute 0.4 0.1 - 0.9 x10E3/uL   EOS (ABSOLUTE) 0.1 0.0 - 0.4 x10E3/uL   Basophils Absolute 0.0 0.0 - 0.2 x10E3/uL   Immature Granulocytes 0 Not Estab. %   Immature Grans (Abs) 0.0 0.0 - 0.1 x10E3/uL  CMP14+EGFR  Result Value Ref Range   Glucose 159 (H) 65 - 99 mg/dL   BUN 25 8 - 27  mg/dL   Creatinine, Ser 1.25 0.76 - 1.27 mg/dL   GFR calc non Af Amer 57 (L) >59 mL/min/1.73   GFR calc Af Amer 66 >59 mL/min/1.73   BUN/Creatinine Ratio 20 10 - 24   Sodium 139 134 - 144 mmol/L   Potassium 4.3 3.5 - 5.2 mmol/L   Chloride 102 96 - 106 mmol/L   CO2 24 20 - 29 mmol/L   Calcium 9.4 8.6 - 10.2 mg/dL   Total Protein 6.7 6.0 - 8.5 g/dL   Albumin 4.5 3.5 - 4.8 g/dL   Globulin, Total 2.2 1.5 - 4.5 g/dL   Albumin/Globulin Ratio 2.0 1.2 - 2.2   Bilirubin Total 0.5 0.0 - 1.2 mg/dL   Alkaline Phosphatase 60 39 - 117 IU/L   AST 23 0 - 40 IU/L   ALT 19 0 - 44 IU/L  Bayer DCA Hb A1c Waived  Result Value Ref Range   Bayer DCA Hb A1c Waived 7.0 (H) <7.0 %      Assessment & Plan:   1. Essential hypertension - metoprolol succinate (TOPROL-XL) 50 MG 24 hr tablet; Take 50 mg by mouth daily.; Refill: 5 - benazepril (LOTENSIN) 5 MG tablet; Take 5 mg by mouth daily.; Refill: 5 - CBC with Differential/Platelet - CMP14+EGFR - Bayer DCA Hb A1c Waived  2. Type 2 diabetes mellitus with diabetic neuropathy, with long-term current use of insulin (HCC) - metFORMIN (GLUCOPHAGE) 500 MG tablet; Take 500 mg by mouth 2 (two) times daily.; Refill: 5 - HUMULIN R 100 UNIT/ML injection; INJECT 10 UNITS THREE TIMES DAILY WITH MEALS; Refill: 5 - CBC with Differential/Platelet - CMP14+EGFR - Bayer DCA Hb A1c Waived  3. Hereditary and idiopathic peripheral neuropathy - oxyCODONE-acetaminophen (PERCOCET/ROXICET) 5-325 MG tablet; Take by mouth. - oxyCODONE-acetaminophen (PERCOCET) 7.5-325 MG tablet; Take 1 tablet by mouth every 4 (four) hours as needed for severe pain.  Dispense: 30  tablet; Refill: 0  4. Onychomycosis  5. Chronic atrial fibrillation (HCC) - XARELTO 20 MG TABS tablet; Take 20 mg by mouth daily.; Refill: 5  6. Gastroesophageal reflux disease without esophagitis - ranitidine (ZANTAC) 300 MG tablet; Take 300 mg by mouth daily.; Refill: 5    Current Outpatient Medications:  .  alendronate (FOSAMAX) 70 MG tablet, Take 70 mg by mouth once a week., Disp: , Rfl: 12 .  ALPRAZolam (XANAX) 0.25 MG tablet, Take 1 tablet (0.25 mg total) by mouth at bedtime., Disp: 30 tablet, Rfl: 1 .  atorvastatin (LIPITOR) 40 MG tablet, Take 40 mg by mouth daily., Disp: , Rfl: 5 .  benazepril (LOTENSIN) 5 MG tablet, Take 5 mg by mouth daily., Disp: , Rfl: 5 .  cyclobenzaprine (FLEXERIL) 10 MG tablet, Take 10 mg by mouth., Disp: , Rfl:  .  gemfibrozil (LOPID) 600 MG tablet, Take 600 mg by mouth 2 (two) times daily., Disp: , Rfl: 5 .  HUMULIN R 100 UNIT/ML injection, INJECT 10 UNITS THREE TIMES DAILY WITH MEALS, Disp: , Rfl: 5 .  metFORMIN (GLUCOPHAGE) 500 MG tablet, Take 500 mg by mouth 2 (two) times daily., Disp: , Rfl: 5 .  metoprolol succinate (TOPROL-XL) 50 MG 24 hr tablet, Take 50 mg by mouth daily., Disp: , Rfl: 5 .  omega-3 acid ethyl esters (LOVAZA) 1 g capsule, Take 1 capsule by mouth 2 (two) times daily., Disp: , Rfl: 3 .  oxyCODONE-acetaminophen (PERCOCET) 7.5-325 MG tablet, Take 1 tablet by mouth every 4 (four) hours as needed for severe pain., Disp: 30 tablet, Rfl: 0 .  oxyCODONE-acetaminophen (PERCOCET/ROXICET) 5-325 MG tablet, Take by mouth., Disp: , Rfl:  .  ranitidine (ZANTAC) 300 MG tablet, Take 300 mg by mouth daily., Disp: , Rfl: 5 .  XARELTO 20 MG TABS tablet, Take 20 mg by mouth daily., Disp: , Rfl: 5 .  GLOBAL INJECT EASE INSULIN SYR 31G X 5/16" 0.3 ML MISC, INJECT THREE TIMES DAILY, Disp: 100 each, Rfl: 2 Continue all other maintenance medications as listed above.  Follow up plan: Return in about 4 weeks (around 09/29/2017).  Educational handout given  for [place] PA-C [place] [address]  [employer], [place] [address] [phone]   09/07/2017, 11:22 AM

## 2017-09-30 ENCOUNTER — Ambulatory Visit (INDEPENDENT_AMBULATORY_CARE_PROVIDER_SITE_OTHER): Payer: Medicare Other | Admitting: Physician Assistant

## 2017-09-30 ENCOUNTER — Encounter: Payer: Self-pay | Admitting: Physician Assistant

## 2017-09-30 VITALS — BP 136/78 | HR 81 | Ht 69.0 in | Wt 225.8 lb

## 2017-09-30 DIAGNOSIS — K219 Gastro-esophageal reflux disease without esophagitis: Secondary | ICD-10-CM | POA: Diagnosis not present

## 2017-09-30 DIAGNOSIS — F411 Generalized anxiety disorder: Secondary | ICD-10-CM

## 2017-09-30 DIAGNOSIS — G609 Hereditary and idiopathic neuropathy, unspecified: Secondary | ICD-10-CM | POA: Diagnosis not present

## 2017-09-30 DIAGNOSIS — I482 Chronic atrial fibrillation, unspecified: Secondary | ICD-10-CM

## 2017-09-30 DIAGNOSIS — E1140 Type 2 diabetes mellitus with diabetic neuropathy, unspecified: Secondary | ICD-10-CM | POA: Diagnosis not present

## 2017-09-30 DIAGNOSIS — Z794 Long term (current) use of insulin: Secondary | ICD-10-CM

## 2017-09-30 MED ORDER — ALPRAZOLAM 0.5 MG PO TABS: 0.5000 mg | ORAL_TABLET | Freq: Every day | ORAL | 2 refills | Status: DC

## 2017-09-30 MED ORDER — OXYCODONE-ACETAMINOPHEN 7.5-325 MG PO TABS: 1.0000 | ORAL_TABLET | ORAL | 0 refills | Status: DC | PRN

## 2017-09-30 MED ORDER — OXYCODONE-ACETAMINOPHEN 5-325 MG PO TABS: 1.0000 | ORAL_TABLET | ORAL | 0 refills | Status: DC | PRN

## 2017-09-30 NOTE — Progress Notes (Signed)
BP 136/78   Pulse 81   Ht 5' 9" (1.753 m)   Wt 225 lb 12.8 oz (102.4 kg)   BMI 33.34 kg/m    Subjective:    Patient ID: Todd Zhang, male    DOB: [birth year], 72 y.o.   MRN: [id number]  HPI: Todd Zhang is a 72 y.o. male presenting on 09/30/2017 for Follow-up (4 week rck ) Patient comes in for 4-week recheck.  He was a new patient last time.  All of his labs and medications have been reviewed.  He states overall he is doing very well with his medications that we have him on.  We do need to do some refills today.  We will plan to see him back in 3 months.  He has no other complaints at this time.  Relevant past medical, surgical, family and social history reviewed and updated as indicated. Allergies and medications reviewed and updated.  Past Medical History:  Diagnosis Date  . Diabetes mellitus without complication ([place])   . Hyperlipidemia   . Hypertension     Past Surgical History:  Procedure Laterality Date  . AMPUTATION TOE    . KNEE SURGERY Left   . NOSE SURGERY      Review of Systems  Constitutional: Negative.  Negative for appetite change and fatigue.  HENT: Negative.   Eyes: Negative.  Negative for pain and visual disturbance.  Respiratory: Negative.  Negative for cough, chest tightness, shortness of breath and wheezing.   Cardiovascular: Negative.  Negative for chest pain, palpitations and leg swelling.  Gastrointestinal: Negative.  Negative for abdominal pain, diarrhea, nausea and vomiting.  Endocrine: Negative.   Genitourinary: Negative.   Musculoskeletal: Positive for arthralgias, back pain and gait problem.  Skin: Negative.  Negative for color change and rash.  Neurological: Negative for weakness, numbness and headaches.  Psychiatric/Behavioral: The patient is nervous/anxious.     Allergies as of 09/30/2017      Reactions   Asa [aspirin] Hives   Penicillins Hives      Medication List        Accurate as of 09/30/17  1:36 PM. Always use your most  recent med list.          alendronate 70 MG tablet Commonly known as:  FOSAMAX Take 70 mg by mouth once a week.   ALPRAZolam 0.5 MG tablet Commonly known as:  XANAX Take 1 tablet (0.5 mg total) by mouth at bedtime.   atorvastatin 40 MG tablet Commonly known as:  LIPITOR Take 40 mg by mouth daily.   benazepril 5 MG tablet Commonly known as:  LOTENSIN Take 5 mg by mouth daily.   cyclobenzaprine 10 MG tablet Commonly known as:  FLEXERIL Take 10 mg by mouth.   GLOBAL INJECT EASE INSULIN SYR 31G X 5/16" 0.3 ML Misc Generic drug:  Insulin Syringe-Needle U-100 INJECT THREE TIMES DAILY   HUMULIN R 100 units/mL injection Generic drug:  insulin regular INJECT 10 UNITS THREE TIMES DAILY WITH MEALS   metFORMIN 500 MG tablet Commonly known as:  GLUCOPHAGE Take 500 mg by mouth 2 (two) times daily.   metoprolol succinate 50 MG 24 hr tablet Commonly known as:  TOPROL-XL Take 50 mg by mouth daily.   omega-3 acid ethyl esters 1 g capsule Commonly known as:  LOVAZA Take 1 capsule by mouth 2 (two) times daily.   oxyCODONE-acetaminophen 7.5-325 MG tablet Commonly known as:  PERCOCET Take 1 tablet by mouth every 4 (four) hours as needed  for severe pain.   oxyCODONE-acetaminophen 5-325 MG tablet Commonly known as:  PERCOCET/ROXICET Take 1 tablet by mouth every 4 (four) hours as needed for severe pain.   ranitidine 300 MG tablet Commonly known as:  ZANTAC Take 300 mg by mouth daily.   XARELTO 20 MG Tabs tablet Generic drug:  rivaroxaban Take 20 mg by mouth daily.          Objective:    BP 136/78   Pulse 81   Ht 5' 9" (1.753 m)   Wt 225 lb 12.8 oz (102.4 kg)   BMI 33.34 kg/m   Allergies  Allergen Reactions  . Asa [Aspirin] Hives  . Penicillins Hives    Physical Exam  Constitutional: He appears well-developed and well-nourished. No distress.  HENT:  Head: Normocephalic and atraumatic.  Eyes: Conjunctivae and EOM are normal. Pupils are equal, round, and  reactive to light.  Cardiovascular: Normal rate, regular rhythm and normal heart sounds.  Pulmonary/Chest: Effort normal and breath sounds normal. No respiratory distress.  Skin: Skin is warm and dry.  Psychiatric: He has a normal mood and affect. His behavior is normal.  Nursing note and vitals reviewed.   Results for orders placed or performed in visit on 09/01/17  CBC with Differential/Platelet  Result Value Ref Range   WBC 7.4 3.4 - 10.8 x10E3/uL   RBC 4.24 4.14 - 5.80 x10E6/uL   Hemoglobin 13.5 13.0 - 17.7 g/dL   Hematocrit 39.6 37.5 - 51.0 %   MCV 93 79 - 97 fL   MCH 31.8 26.6 - 33.0 pg   MCHC 34.1 31.5 - 35.7 g/dL   RDW 14.2 12.3 - 15.4 %   Platelets 191 150 - 379 x10E3/uL   Neutrophils 45 Not Estab. %   Lymphs 49 Not Estab. %   Monocytes 5 Not Estab. %   Eos 1 Not Estab. %   Basos 0 Not Estab. %   Neutrophils Absolute 3.3 1.4 - 7.0 x10E3/uL   Lymphocytes Absolute 3.6 (H) 0.7 - 3.1 x10E3/uL   Monocytes Absolute 0.4 0.1 - 0.9 x10E3/uL   EOS (ABSOLUTE) 0.1 0.0 - 0.4 x10E3/uL   Basophils Absolute 0.0 0.0 - 0.2 x10E3/uL   Immature Granulocytes 0 Not Estab. %   Immature Grans (Abs) 0.0 0.0 - 0.1 x10E3/uL  CMP14+EGFR  Result Value Ref Range   Glucose 159 (H) 65 - 99 mg/dL   BUN 25 8 - 27 mg/dL   Creatinine, Ser 1.25 0.76 - 1.27 mg/dL   GFR calc non Af Amer 57 (L) >59 mL/min/1.73   GFR calc Af Amer 66 >59 mL/min/1.73   BUN/Creatinine Ratio 20 10 - 24   Sodium 139 134 - 144 mmol/L   Potassium 4.3 3.5 - 5.2 mmol/L   Chloride 102 96 - 106 mmol/L   CO2 24 20 - 29 mmol/L   Calcium 9.4 8.6 - 10.2 mg/dL   Total Protein 6.7 6.0 - 8.5 g/dL   Albumin 4.5 3.5 - 4.8 g/dL   Globulin, Total 2.2 1.5 - 4.5 g/dL   Albumin/Globulin Ratio 2.0 1.2 - 2.2   Bilirubin Total 0.5 0.0 - 1.2 mg/dL   Alkaline Phosphatase 60 39 - 117 IU/L   AST 23 0 - 40 IU/L   ALT 19 0 - 44 IU/L  Bayer DCA Hb A1c Waived  Result Value Ref Range   Bayer DCA Hb A1c Waived 7.0 (H) <7.0 %      Assessment &  Plan:   1. Type 2 diabetes mellitus  with diabetic neuropathy, with long-term current use of insulin ([place])  2. Hereditary and idiopathic peripheral neuropathy - oxyCODONE-acetaminophen (PERCOCET) 7.5-325 MG tablet; Take 1 tablet by mouth every 4 (four) hours as needed for severe pain.  Dispense: 90 tablet; Refill: 0 - oxyCODONE-acetaminophen (PERCOCET/ROXICET) 5-325 MG tablet; Take 1 tablet by mouth every 4 (four) hours as needed for severe pain.  Dispense: 90 tablet; Refill: 0  3. Gastroesophageal reflux disease without esophagitis  4. Chronic atrial fibrillation (HCC)  5. Generalized anxiety disorder - ALPRAZolam (XANAX) 0.5 MG tablet; Take 1 tablet (0.5 mg total) by mouth at bedtime.  Dispense: 30 tablet; Refill: 2    Current Outpatient Medications:  .  alendronate (FOSAMAX) 70 MG tablet, Take 70 mg by mouth once a week., Disp: , Rfl: 12 .  ALPRAZolam (XANAX) 0.5 MG tablet, Take 1 tablet (0.5 mg total) by mouth at bedtime., Disp: 30 tablet, Rfl: 2 .  atorvastatin (LIPITOR) 40 MG tablet, Take 40 mg by mouth daily., Disp: , Rfl: 5 .  benazepril (LOTENSIN) 5 MG tablet, Take 5 mg by mouth daily., Disp: , Rfl: 5 .  cyclobenzaprine (FLEXERIL) 10 MG tablet, Take 10 mg by mouth., Disp: , Rfl:  .  GLOBAL INJECT EASE INSULIN SYR 31G X 5/16" 0.3 ML MISC, INJECT THREE TIMES DAILY, Disp: 100 each, Rfl: 2 .  HUMULIN R 100 UNIT/ML injection, INJECT 10 UNITS THREE TIMES DAILY WITH MEALS, Disp: , Rfl: 5 .  metFORMIN (GLUCOPHAGE) 500 MG tablet, Take 500 mg by mouth 2 (two) times daily., Disp: , Rfl: 5 .  metoprolol succinate (TOPROL-XL) 50 MG 24 hr tablet, Take 50 mg by mouth daily., Disp: , Rfl: 5 .  omega-3 acid ethyl esters (LOVAZA) 1 g capsule, Take 1 capsule by mouth 2 (two) times daily., Disp: , Rfl: 3 .  oxyCODONE-acetaminophen (PERCOCET) 7.5-325 MG tablet, Take 1 tablet by mouth every 4 (four) hours as needed for severe pain., Disp: 90 tablet, Rfl: 0 .  oxyCODONE-acetaminophen (PERCOCET/ROXICET)  5-325 MG tablet, Take 1 tablet by mouth every 4 (four) hours as needed for severe pain., Disp: 90 tablet, Rfl: 0 .  ranitidine (ZANTAC) 300 MG tablet, Take 300 mg by mouth daily., Disp: , Rfl: 5 .  XARELTO 20 MG TABS tablet, Take 20 mg by mouth daily., Disp: , Rfl: 5 Continue all other maintenance medications as listed above.  Follow up plan: Return in about 2 months (around 11/28/2017) for recheck.  Educational handout given for [place] PA-C [place] [address]  [place], [place] [address] [phone]   09/30/2017, 1:36 PM

## 2017-09-30 NOTE — Patient Instructions (Signed)
In a few days you may receive a survey in the mail or online from [employer] regarding your visit with us today. Please take a moment to fill this out. Your feedback is very important to our whole office. It can help us better understand your needs as well as improve your experience and satisfaction. Thank you for taking your time to complete it. We care about you.  Tyreshia Ingman, PA-C  

## 2017-10-05 ENCOUNTER — Ambulatory Visit (INDEPENDENT_AMBULATORY_CARE_PROVIDER_SITE_OTHER): Payer: Medicare Other | Admitting: Orthopaedic Surgery

## 2017-10-05 ENCOUNTER — Encounter (INDEPENDENT_AMBULATORY_CARE_PROVIDER_SITE_OTHER): Payer: Self-pay | Admitting: Orthopaedic Surgery

## 2017-10-05 ENCOUNTER — Encounter: Payer: Self-pay | Admitting: Family Medicine

## 2017-10-05 ENCOUNTER — Ambulatory Visit (INDEPENDENT_AMBULATORY_CARE_PROVIDER_SITE_OTHER): Payer: Medicare Other | Admitting: Family Medicine

## 2017-10-05 ENCOUNTER — Ambulatory Visit (INDEPENDENT_AMBULATORY_CARE_PROVIDER_SITE_OTHER): Payer: Medicare Other

## 2017-10-05 VITALS — BP 104/65 | HR 104 | Resp 14 | Ht 72.0 in | Wt 228.0 lb

## 2017-10-05 VITALS — BP 118/70 | HR 79 | Temp 98.8°F | Ht 69.0 in | Wt 228.0 lb

## 2017-10-05 DIAGNOSIS — S62315A Displaced fracture of base of fourth metacarpal bone, left hand, initial encounter for closed fracture: Secondary | ICD-10-CM

## 2017-10-05 DIAGNOSIS — J441 Chronic obstructive pulmonary disease with (acute) exacerbation: Secondary | ICD-10-CM | POA: Diagnosis not present

## 2017-10-05 DIAGNOSIS — S62355A Nondisplaced fracture of shaft of fourth metacarpal bone, left hand, initial encounter for closed fracture: Secondary | ICD-10-CM | POA: Diagnosis not present

## 2017-10-05 DIAGNOSIS — M79642 Pain in left hand: Secondary | ICD-10-CM

## 2017-10-05 MED ORDER — AZITHROMYCIN 250 MG PO TABS: ORAL_TABLET | ORAL | 0 refills | Status: DC

## 2017-10-05 NOTE — Progress Notes (Signed)
Office Visit Note   Patient: Todd Zhang           Date of Birth: 04/16/1946           MRN: [id number] Visit Date: 10/05/2017              Requested by: Remus Loffler, PA-C [address] [place], [place] [address] PCP: Remus Loffler, PA-C   Assessment & Plan: Visit Diagnoses:  1. Closed nondisplaced fracture of shaft of fourth metacarpal bone of left hand, initial encounter     Plan: Comminuted essentially nondisplaced fracture of left ring metacarpal fracture. We'll treat with a bulky hand dressing and follow up weekly for x-rays. If any displacement would consider open reduction internal fixation.  Follow-Up Instructions: Return in about 1 week (around 10/12/2017).   Orders:  No orders of the defined types were placed in this encounter.  No orders of the defined types were placed in this encounter.     Procedures: No procedures performed   Clinical Data: No additional findings.   Subjective: Chief Complaint  Patient presents with  . Left Hand - Pain, Fracture, Injury    Todd Zhang is a  72 y o here today for displaced fracture of base of fourth metacarpal bone, left hand. He relates he takes 4 oxycodone per day x years.Todd Kobus Jones,PA)  Chronic pain meds for pain related to arthritis. Also on xarelto for a heart related condition. Todd Zhang fell yesterday injuring his left hand. Films today reveal a closed essentially nondisplaced comminuted fracture of the ring metacarpal. He is here for orthopedic evaluation. I reviewed films of his left hand on the PACS system. There is a comminuted fracture of the left ring metacarpal shaft. There probably at least 3 separate fractures but all are well aligned. He doesn't appear to have any shortening or increased ulnar radial deviation. I will film reveals excellent position as well. 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  HENT: Positive for  hearing loss.   Respiratory: Positive for apnea. Negative for chest tightness and shortness of breath.   Cardiovascular: Positive for chest pain. Negative for palpitations and leg swelling.  Gastrointestinal: Negative for blood in stool, constipation and diarrhea.  Genitourinary: Negative for difficulty urinating.  Musculoskeletal: Positive for arthralgias, back pain, joint swelling, neck pain and neck stiffness. Negative for myalgias.  Neurological: Positive for weakness. Negative for numbness and headaches.  Hematological: Does not bruise/bleed easily.  Psychiatric/Behavioral: Negative for sleep disturbance. The patient is not nervous/anxious.      Objective: Vital Signs: BP 104/65   Pulse (!) 104   Resp 14   Ht 6' (1.829 m)   Wt 228 lb (103.4 kg)   BMI 30.92 kg/m   Physical Exam  Ortho Exam awake alert and oriented 3. Comfortable sitting does have some swelling in the dorsum of his left hand. Alignment of all of his fingers appears to be appropriate. Good capillary refill. Good sensibility. No obvious deformity of his ring finger. Skin intact  Specialty Comments:  No specialty comments available.  Imaging: Dg Hand Complete Left  Result Date: 10/05/2017 CLINICAL DATA:  Left hand pain after a fall yesterday. EXAM: LEFT HAND - COMPLETE 3+ VIEW COMPARISON:  None in PACs FINDINGS: The patient has sustained a comminuted mildly distracted fracture of the shaft of the fourth metacarpal. The other metacarpals are intact as are the phalanges. There is  severe osteoarthritic change of the first [facility]. The other CMC joints are reasonably well-maintained. The carpal bones and distal radius and ulna exhibit no acute abnormality. There is diffuse soft tissue swelling. IMPRESSION: There is an acute comminuted spiral fracture of the shaft of the fourth metacarpal exhibiting mild displacement. No acute fracture is observed elsewhere. 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  Electronically Signed   By: David  SwazilandJordan M.D.   On: 10/05/2017 09:35     PMFS History: Patient Active Problem List   Diagnosis Date Noted  . Closed nondisplaced fracture of shaft of fourth metacarpal bone of left hand 10/05/2017  . Gastroesophageal reflux disease without esophagitis 09/07/2017  . HTN (hypertension) 09/01/2017  . Type 2 diabetes mellitus with diabetic neuropathy, unspecified (HCC) 09/01/2017  . Onychomycosis 09/01/2017  . Atrial fibrillation (HCC) 09/01/2017  . Diabetes (HCC) 06/19/2014  . Back pain 06/19/2014  . Neck pain 06/19/2014  . Hereditary and idiopathic peripheral neuropathy 06/19/2014   Past Medical History:  Diagnosis Date  . Diabetes mellitus without complication (HCC)   . Hyperlipidemia   . Hypertension     History reviewed. No pertinent family history.  Past Surgical History:  Procedure Laterality Date  . AMPUTATION TOE    . KNEE SURGERY Left   . NOSE SURGERY     Social History   Occupational History  . Not on file  Tobacco Use  . Smoking status: Former Smoker    Packs/day: 1.00    Types: Cigarettes    Start date: 06/19/1966    Last attempt to quit: 06/20/2011    Years since quitting: 6.2  . Smokeless tobacco: Never Used  Substance and Sexual Activity  . Alcohol use: No    Frequency: Never    Comment: quit drinking in 1982  . Drug use: Yes    Comment: marijuana and cocaine in the past, quit in 2012  . Sexual activity: Not on file

## 2017-10-05 NOTE — Progress Notes (Signed)
BP 118/70   Pulse 79   Temp 98.8 F (37.1 C)   Ht 5\' 9"  (1.753 m)   Wt 228 lb (103.4 kg)   BMI 33.67 kg/m    Subjective:    Patient ID: Todd Zhang, male    DOB: [birth year], 72 y.o.   MRN: [id number]  HPI: Todd Zhang is a 72 y.o. male presenting on 10/05/2017 for Hand Pain (left hand pain - fell yesterday )   HPI Left Hand Pain: Patient presents to clinic with left hand pain x 1 day. Patient fell yesterday morning on the ulnar side of hand. He rates his pain a 2/10 just on the dorsal side of his hand, particularly over the 4th metacarpal bone. Patient admits to swelling of the left hand, but says he is able to move all of his fingers and wrist.  Patient denies any tingling/numbness of fingers.   Cough: Patient presents to the clinic with chronic, productive cough for awhile. Patient cannot pinpoint when the cough began, but states it is a chronic problem that has worsened recently. Patient sometimes coughs of yellow/clearish phlegm. Patient has tried breathing treatments for his cough. Patient is having mild shortness of breath with his cough attacks, but denies trouble breathing aside from the cough. Patient denies fever, chills, and chest pain.   Relevant past medical, surgical, family and social history reviewed and updated as indicated. Interim medical history since our last visit reviewed. Allergies and medications reviewed and updated.  Review of Systems  Constitutional: Negative for chills, diaphoresis and fever.  HENT: Negative for congestion, ear pain, rhinorrhea, sinus pressure, sinus pain and sore throat.   Respiratory: Positive for cough (productive cough for awhile now) and shortness of breath. Negative for wheezing.   Cardiovascular: Negative for chest pain and palpitations.  Musculoskeletal:       Positive for swelling over dorsal aspect of hand, pain over 4th metacarpal  Neurological: Negative for numbness.    Per HPI unless specifically indicated  above   Allergies as of 10/05/2017      Reactions   Asa [aspirin] Hives   Penicillins Hives      Medication List        Accurate as of 10/05/17 10:17 AM. Always use your most recent med list.          alendronate 70 MG tablet Commonly known as:  FOSAMAX Take 70 mg by mouth once a week.   ALPRAZolam 0.5 MG tablet Commonly known as:  XANAX Take 1 tablet (0.5 mg total) by mouth at bedtime.   atorvastatin 40 MG tablet Commonly known as:  LIPITOR Take 40 mg by mouth daily.   azithromycin 250 MG tablet Commonly known as:  ZITHROMAX Take 2 the first day and then one each day after.   benazepril 5 MG tablet Commonly known as:  LOTENSIN Take 5 mg by mouth daily.   cyclobenzaprine 10 MG tablet Commonly known as:  FLEXERIL Take 10 mg by mouth.   GLOBAL INJECT EASE INSULIN SYR 31G X 5/16" 0.3 ML Misc Generic drug:  Insulin Syringe-Needle U-100 INJECT THREE TIMES DAILY   HUMULIN R 100 units/mL injection Generic drug:  insulin regular INJECT 10 UNITS THREE TIMES DAILY WITH MEALS   metFORMIN 500 MG tablet Commonly known as:  GLUCOPHAGE Take 500 mg by mouth 2 (two) times daily.   metoprolol succinate 50 MG 24 hr tablet Commonly known as:  TOPROL-XL Take 50 mg by mouth daily.   omega-3 acid ethyl  esters 1 g capsule Commonly known as:  LOVAZA Take 1 capsule by mouth 2 (two) times daily.   oxyCODONE-acetaminophen 7.5-325 MG tablet Commonly known as:  PERCOCET Take 1 tablet by mouth every 4 (four) hours as needed for severe pain.   oxyCODONE-acetaminophen 5-325 MG tablet Commonly known as:  PERCOCET/ROXICET Take 1 tablet by mouth every 4 (four) hours as needed for severe pain.   ranitidine 300 MG tablet Commonly known as:  ZANTAC Take 300 mg by mouth daily.   XARELTO 20 MG Tabs tablet Generic drug:  rivaroxaban Take 20 mg by mouth daily.          Objective:    BP 118/70   Pulse 79   Temp 98.8 F (37.1 C)   Ht 5\' 9"  (1.753 m)   Wt 228 lb (103.4 kg)    BMI 33.67 kg/m   Wt Readings from Last 3 Encounters:  10/05/17 228 lb (103.4 kg)  09/30/17 225 lb 12.8 oz (102.4 kg)  09/01/17 232 lb (105.2 kg)    Physical Exam  Constitutional: He appears well-developed and well-nourished. No distress.  Cardiovascular: Normal rate, regular rhythm and normal heart sounds. Exam reveals no gallop and no friction rub.  No murmur heard. Pulmonary/Chest: Effort normal and breath sounds normal. No accessory muscle usage. No respiratory distress. He has no wheezes. He has no rales.  Musculoskeletal:       Left hand: He exhibits tenderness (over 4th metacarpal bone), bony tenderness and swelling. He exhibits normal range of motion and normal capillary refill. Normal sensation noted. Decreased sensation is not present in the ulnar distribution, is not present in the medial redistribution and is not present in the radial distribution. Decreased strength (mild decreased in strength) noted.  Psychiatric: He has a normal mood and affect. His behavior is normal.    Left hand x-ray: Spiral fracture of fourth metacarpal bone that is displaced    Assessment & Plan:   Problem List Items Addressed This Visit    None    Visit Diagnoses    Displaced fracture of base of fourth metacarpal bone, left hand, initial encounter for closed fracture    -  Primary   Relevant Orders   DG Hand Complete Left (Completed)   Ambulatory referral to Orthopedic Surgery   COPD exacerbation (HCC)       Relevant Medications   azithromycin (ZITHROMAX) 250 MG tablet    Fractured 4th Metacarpal Bone: Patient had a left hand x-ray that showed a displaced fracture of base of fourth metacarpal bone. Patient was sent directly to [facility] in [place] for treatment of fracture.   COPD Exacerbation: Patient presents to clinic with chronic, productive cough. Patient had a "cough attack" during physical exam of left hand and had a hard time catching his breath. Patient has been prescribed  Azithromycin 250 mg tablets for 5 days. Patient has been instructed to take 2 tablets on the first day and then one each day after for his COPD exacerbation.  Likely COPD based on symptoms, will avoid prednisone for now because of the fracture and do not want to delay healing.  Follow up plan: Return if symptoms worsen or fail to improve.  Counseling provided for all of the vaccine components Orders Placed This Encounter  Procedures  . DG Hand Complete Left  . Ambulatory referral to Orthopedic Surgery   Patient seen and examined with Haywood Filler, PA Student. Agree with assessment and plan above.  Arville Care, MD [facility] 10/05/2017, 12:55 PM

## 2017-10-12 ENCOUNTER — Ambulatory Visit (INDEPENDENT_AMBULATORY_CARE_PROVIDER_SITE_OTHER): Payer: Medicare Other

## 2017-10-12 ENCOUNTER — Encounter (INDEPENDENT_AMBULATORY_CARE_PROVIDER_SITE_OTHER): Payer: Self-pay | Admitting: Orthopaedic Surgery

## 2017-10-12 ENCOUNTER — Ambulatory Visit (INDEPENDENT_AMBULATORY_CARE_PROVIDER_SITE_OTHER): Payer: Medicare Other | Admitting: Orthopaedic Surgery

## 2017-10-12 DIAGNOSIS — M79642 Pain in left hand: Secondary | ICD-10-CM

## 2017-10-12 NOTE — Progress Notes (Signed)
   Office Visit Note   Patient: Todd Zhang           Date of Birth: 14-Jul-1946           MRN: [id number] Visit Date: 10/12/2017              Requested by: Remus LofflerJones, Angel S, PA-C [address] [place], [place] [id number] PCP: Remus LofflerJones, Angel S, PA-C   Assessment & Plan: Visit Diagnoses:  1. Pain in left hand     Plan: 1 week status post splinting of left hand for a nondisplaced comminuted fracture of the ring metacarpal. Doing well.Repeat films today with good position. We'll reevaluate in 2 weeks with repeat films out of the splint  Follow-Up Instructions: Return in about 2 weeks (around 10/26/2017).   Orders:  Orders Placed This Encounter  Procedures  . XR Hand Complete Left   No orders of the defined types were placed in this encounter.     Procedures: No procedures performed   Clinical Data: No additional findings.   Subjective: Chief Complaint  Patient presents with  . Left Hip - Pain  presently comfortable in a well-padded bulkyl eft hand dressing.no significant pain.  HPI  Review of Systems   Objective: Vital Signs: There were no vitals taken for this visit.  Physical Exam  Ortho Exam Wake alert and oriented 3. Comfortable sitting. No swelling of the fingers. Good sensation. Specialty Comments:  No specialty comments available.  Imaging: Xr Hand Complete Left  Result Date: 10/12/2017 Films the left hand were obtained in 3 projections. fracture of the ring metacarpalis comminuted and essentially non-displaced. No change in position from last week    PMFS History: Patient Active Problem List   Diagnosis Date Noted  . Closed nondisplaced fracture of shaft of fourth metacarpal bone of left hand 10/05/2017  . Gastroesophageal reflux disease without esophagitis 09/07/2017  . HTN (hypertension) 09/01/2017  . Type 2 diabetes mellitus with diabetic neuropathy, unspecified (HCC) 09/01/2017  . Onychomycosis 09/01/2017  . Atrial fibrillation (HCC)  09/01/2017  . Diabetes (HCC) 06/19/2014  . Back pain 06/19/2014  . Neck pain 06/19/2014  . Hereditary and idiopathic peripheral neuropathy 06/19/2014   Past Medical History:  Diagnosis Date  . Diabetes mellitus without complication (HCC)   . Hyperlipidemia   . Hypertension     History reviewed. No pertinent family history.  Past Surgical History:  Procedure Laterality Date  . AMPUTATION TOE    . KNEE SURGERY Left   . NOSE SURGERY     Social History   Occupational History  . Not on file  Tobacco Use  . Smoking status: Former Smoker    Packs/day: 1.00    Types: Cigarettes    Start date: 06/19/1966    Last attempt to quit: 06/20/2011    Years since quitting: 6.3  . Smokeless tobacco: Never Used  Substance and Sexual Activity  . Alcohol use: No    Frequency: Never    Comment: quit drinking in 1982  . Drug use: Yes    Comment: marijuana and cocaine in the past, quit in 2012  . Sexual activity: Not on file     Valeria BatmanPeter W Kailey Esquilin, MD   Note - This record has been created using [employer] software.  Chart creation errors have been sought, but may not always  have been located. Such creation errors do not reflect on  the standard of medical care.

## 2017-10-17 ENCOUNTER — Other Ambulatory Visit: Payer: Self-pay | Admitting: Physician Assistant

## 2017-10-24 ENCOUNTER — Other Ambulatory Visit: Payer: Self-pay | Admitting: Physician Assistant

## 2017-10-26 ENCOUNTER — Ambulatory Visit (INDEPENDENT_AMBULATORY_CARE_PROVIDER_SITE_OTHER): Payer: Medicare Other | Admitting: Orthopaedic Surgery

## 2017-10-26 ENCOUNTER — Ambulatory Visit (INDEPENDENT_AMBULATORY_CARE_PROVIDER_SITE_OTHER): Payer: Medicare Other | Admitting: *Deleted

## 2017-10-26 DIAGNOSIS — Z23 Encounter for immunization: Secondary | ICD-10-CM

## 2017-10-26 DIAGNOSIS — Z0000 Encounter for general adult medical examination without abnormal findings: Secondary | ICD-10-CM

## 2017-10-26 NOTE — Progress Notes (Signed)
Subjective:   Todd Zhang is a 72 y.o. male who presents for an Initial Medicare Annual Wellness Visit.  Todd Zhang  Lives with his sister and nephew in [place], [place].  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  He enjoys gardening, fishing, and playing musical instruments.  Todd Zhang has 2 children, 4 grandchildren, and 4 great grandchildren.  He feels his health is worse this year than last year because he does not feel as good physically.  He denies any hospitalizations, ER visits or surgeries in the past year.  He does have a left hand fracture that he sustained 10/03/17 when walking to the bathroom in the dark at night.  He is being followed for this fracture by Dr. Cleophas DunkerWhitfield in [place], [place].   Review of Systems  Musculoskeletal: bilateral foot, hand, and shoulder pain  All other systems negative      Objective:    Today's Vitals   10/26/17 1432  BP: 114/75  Pulse: 89  Weight: 224 lb (101.6 kg)  Height: 5\' 9"  (1.753 m)  PainSc: 8   PainLoc: Foot   Body mass index is 33.08 kg/m.  Advanced Directives 10/26/2017  Does Patient Have a Medical Advance Directive? No  Would patient like information on creating a medical advance directive? Yes (ED - Information included in AVS)    Current Medications (verified) Outpatient Encounter Medications as of 10/26/2017  Medication Sig  . alendronate (FOSAMAX) 70 MG tablet TAKE 1 TABLET BY MOUTH EVERY WEEK  . ALPRAZolam (XANAX) 0.5 MG tablet Take 1 tablet (0.5 mg total) by mouth at bedtime.  [name]. atorvastatin (LIPITOR) 40 MG tablet Take 40 mg by mouth daily.  [name]. azithromycin (ZITHROMAX) 250 MG tablet Take 2 the first day and then one each day after.  . benazepril (LOTENSIN) 5 MG tablet Take 5 mg by mouth daily.  . cyclobenzaprine (FLEXERIL) 10 MG tablet Take 10 mg by mouth.  Pearson Grippe. GLOBAL INJECT EASE INSULIN SYR 31G X 5/16" 0.3 ML MISC INJECT THREE TIMES DAILY  . HUMULIN R 100 UNIT/ML  injection INJECT 10 UNITS THREE TIMES DAILY WITH MEALS  . metFORMIN (GLUCOPHAGE) 500 MG tablet Take 500 mg by mouth 2 (two) times daily.  . metoprolol succinate (TOPROL-XL) 50 MG 24 hr tablet Take 50 mg by mouth daily.  [name]. omega-3 acid ethyl esters (LOVAZA) 1 g capsule Take 1 capsule by mouth 2 (two) times daily.  [name]. oxyCODONE-acetaminophen (PERCOCET) 7.5-325 MG tablet Take 1 tablet by mouth every 4 (four) hours as needed for severe pain.  [name]. oxyCODONE-acetaminophen (PERCOCET/ROXICET) 5-325 MG tablet Take 1 tablet by mouth every 4 (four) hours as needed for severe pain.  . ranitidine (ZANTAC) 300 MG tablet Take 300 mg by mouth daily.  [name]. XARELTO 20 MG TABS tablet Take 20 mg by mouth daily.   No facility-administered encounter medications on file as of 10/26/2017.     Allergies (verified) Asa [aspirin] and Penicillins   History: Past Medical History:  Diagnosis Date  . Diabetes mellitus without complication (HCC)   . Hyperlipidemia   . Hypertension    Past Surgical History:  Procedure Laterality Date  . AMPUTATION TOE    . KNEE SURGERY Left   . NOSE SURGERY     No family history on file. Social History   Socioeconomic History  . Marital status: Single    Spouse name: None  . Number of children: None  .  Years of education: None  . Highest education level: None  Social Needs  . Financial resource strain: None  . Food insecurity - worry: None  . Food insecurity - inability: None  . Transportation needs - medical: None  . Transportation needs - non-medical: None  Occupational History  . None  Tobacco Use  . Smoking status: Former Smoker    Packs/day: 1.00    Types: Cigarettes    Start date: 06/19/1966    Last attempt to quit: 06/20/2011    Years since quitting: 6.3  . Smokeless tobacco: Never Used  Substance and Sexual Activity  . Alcohol use: No    Frequency: Never    Comment: quit drinking in 1982  . Drug use: Yes    Comment: marijuana and cocaine in the past, quit in  2012  . Sexual activity: None  Other Topics Concern  . None  Social History Narrative  . None   Tobacco Counseling Counseling given: Not Answered   Clinical Intake:     Pain Score: 8                  Activities of Daily Living In your present state of health, do you have any difficulty performing the following activities: 10/26/2017  Hearing? Y  Comment decreased hearing right ear, has seen audiologist   Vision? N  Difficulty concentrating or making decisions? N  Walking or climbing stairs? Y  Comment trouble climbing stairs, due to balance and leg pain  Dressing or bathing? Y  Comment Able to dress and bathe without assistance, but sometimes causes pain  Doing errands, shopping? N  Preparing Food and eating ? N  Using the Toilet? N  In the past six months, have you accidently leaked urine? N  Do you have problems with loss of bowel control? N  Managing your Medications? N  Managing your Finances? N  Housekeeping or managing your Housekeeping? N  Some recent data might be hidden     Immunizations and Health Maintenance Immunization History  Administered Date(s) Administered  . Influenza, High Dose Seasonal PF 05/31/2017  . Influenza,inj,Quad PF,6+ Mos 06/19/2014  . Pneumococcal Conjugate-13 05/12/2015  . Pneumococcal Polysaccharide-23 10/26/2017  . Tdap 05/12/2015  . Zoster 05/20/2015   Health Maintenance Due  Topic Date Due  . Hepatitis C Screening  [birth year]  . FOOT EXAM  07/29/1955  . OPHTHALMOLOGY EXAM  07/29/1955    Patient Care Team: Caryl Never as PCP - General (Physician Assistant)  Indicate any recent Medical Services you may have received from other than [facility] providers in the past year (date may be approximate).    Assessment:   This is a routine wellness examination for [place].  Hearing/Vision screen Per patient he is up to date on eye exam.  Called Dr. Norris Cross office in [place], [place] and requested notes. Patient states he has  been seen by an audiologist for diminished hearing.  States he was told he needs hearing aids, and is waiting to find out if there are any covered by his insurance.    Dietary issues and exercise activities discussed:    Patient does not do exercise due to orthopedic issues.   Patient is conscious of his carbohydrate intake.  Usually eats 2 meals per day and snacks as needed.  Tries to eat mostly protein, vegetables, and fruit.  Goals    . DIET - REDUCE SUGAR INTAKE     Continue to watch carbohydrate intake by avoiding baked goods and  candy as much as possible.       Depression Screen PHQ 2/9 Scores 10/26/2017 09/30/2017 09/01/2017 06/19/2014  PHQ - 2 Score 1 3 3  0  PHQ- 9 Score - 10 13 -    Fall Risk Fall Risk  10/26/2017 09/30/2017 09/01/2017 06/19/2014  Falls in the past year? Yes Yes Yes Yes  Number falls in past yr: 2 or more 1 1 2  or more  Injury with Fall? Yes Yes Yes -  Risk Factor Category  High Fall Risk High Fall Risk High Fall Risk -  Risk for fall due to : History of fall(s);Impaired balance/gait - - Impaired balance/gait    Is the patient's home free of loose throw rugs in walkways, pet beds, electrical cords, etc?   yes      Grab bars in the bathroom? yes      Handrails on the stairs?   yes      Adequate lighting?   yes    Cognitive Function: MMSE - Mini Mental State Exam 10/26/2017  Orientation to time 5  Orientation to Place 4  Registration 3  Attention/ Calculation 2  Recall 0  Language- name 2 objects 2  Language- repeat 1  Language- follow 3 step command 3  Language- read & follow direction 1  Write a sentence 1  Copy design 1  Total score 23        Screening Tests Health Maintenance  Topic Date Due  . Hepatitis C Screening  06-22-1946  . FOOT EXAM  07/29/1955  . OPHTHALMOLOGY EXAM  07/29/1955  . HEMOGLOBIN A1C  03/02/2018  . COLONOSCOPY  06/19/2020  . TETANUS/TDAP  05/11/2025  . INFLUENZA VACCINE  Completed  . PNA vac Low Risk Adult   Completed    Qualifies for Shingles Vaccine? Yes   Cancer Screenings: Lung: Low Dose CT Chest recommended if Age 39-80 years, 30 pack-year currently smoking OR have quit w/in 15years. Patient does qualify. Colorectal:  Up to date  Additional Screenings:  Hepatitis B/HIV/Syphillis: Hepatitis C Screening:   Recommend at next visit      Plan:        Review the information given on Advanced Directives, and if you complete these, please bring our office a copy to file in your chart. Avoid processed sugar in bakes goods and candy   I have personally reviewed and noted the following in the patient's chart:   . Medical and social history . Use of alcohol, tobacco or illicit drugs  . Current medications and supplements . Functional ability and status . Nutritional status . Physical activity . Advanced directives . List of other physicians . Hospitalizations, surgeries, and ER visits in previous 12 months . Vitals . Screenings to include cognitive, depression, and falls . Referrals and appointments  In addition, I have reviewed and discussed with patient certain preventive protocols, quality metrics, and best practice recommendations. A written personalized care plan for preventive services as well as general preventive health recommendations were provided to patient.     Maxima Skelton M, RN   10/26/2017    I have reviewed and agree with the above AWV documentation.   Remus Loffler PA-C [facility] [address]  [place], [place] [address] [phone]

## 2017-10-26 NOTE — Patient Instructions (Addendum)
Please review the information given on Advanced Directives, and if you complete these, please bring our office a copy to file in your chart.  Thank you for coming in for your Annual Wellness Visit today!   Preventive Care 14 Years and Older, Male Preventive care refers to lifestyle choices and visits with your health care provider that can promote health and wellness. What does preventive care include?  A yearly physical exam. This is also called an annual well check.  Dental exams once or twice a year.  Routine eye exams. Ask your health care provider how often you should have your eyes checked.  Personal lifestyle choices, including: ? Daily care of your teeth and gums. ? Regular physical activity. ? Eating a healthy diet. ? Avoiding tobacco and drug use. ? Limiting alcohol use. ? Practicing safe sex. ? Taking low doses of aspirin every day. ? Taking vitamin and mineral supplements as recommended by your health care provider. What happens during an annual well check? The services and screenings done by your health care provider during your annual well check will depend on your age, overall health, lifestyle risk factors, and family history of disease. Counseling Your health care provider may ask you questions about your:  Alcohol use.  Tobacco use.  Drug use.  Emotional well-being.  Home and relationship well-being.  Sexual activity.  Eating habits.  History of falls.  Memory and ability to understand (cognition).  Work and work Statistician.  Screening You may have the following tests or measurements:  Height, weight, and BMI.  Blood pressure.  Lipid and cholesterol levels. These may be checked every 5 years, or more frequently if you are over 13 years old.  Skin check.  Lung cancer screening. You may have this screening every year starting at age 40 if you have a 30-pack-year history of smoking and currently smoke or have quit within the past 15  years.  Fecal occult blood test (FOBT) of the stool. You may have this test every year starting at age 73.  Flexible sigmoidoscopy or colonoscopy. You may have a sigmoidoscopy every 5 years or a colonoscopy every 10 years starting at age 73.  Prostate cancer screening. Recommendations will vary depending on your family history and other risks.  Hepatitis C blood test.  Hepatitis B blood test.  Sexually transmitted disease (STD) testing.  Diabetes screening. This is done by checking your blood sugar (glucose) after you have not eaten for a while (fasting). You may have this done every 1-3 years.  Abdominal aortic aneurysm (AAA) screening. You may need this if you are a current or former smoker.  Osteoporosis. You may be screened starting at age 49 if you are at high risk.  Talk with your health care provider about your test results, treatment options, and if necessary, the need for more tests. Vaccines Your health care provider may recommend certain vaccines, such as:  Influenza vaccine. This is recommended every year.  Tetanus, diphtheria, and acellular pertussis (Tdap, Td) vaccine. You may need a Td booster every 10 years.  Varicella vaccine. You may need this if you have not been vaccinated.  Zoster vaccine. You may need this after age 5.  Measles, mumps, and rubella (MMR) vaccine. You may need at least one dose of MMR if you were born in 1957 or later. You may also need a second dose.  Pneumococcal 13-valent conjugate (PCV13) vaccine. One dose is recommended after age 73.  Pneumococcal polysaccharide (PPSV23) vaccine. One dose is recommended  after age 73.  Meningococcal vaccine. You may need this if you have certain conditions.  Hepatitis A vaccine. You may need this if you have certain conditions or if you travel or work in places where you may be exposed to hepatitis A.  Hepatitis B vaccine. You may need this if you have certain conditions or if you travel or work in  places where you may be exposed to hepatitis B.  Haemophilus influenzae type b (Hib) vaccine. You may need this if you have certain risk factors.  Talk to your health care provider about which screenings and vaccines you need and how often you need them. This information is not intended to replace advice given to you by your health care provider. Make sure you discuss any questions you have with your health care provider. Document Released: 09/26/2015 Document Revised: 05/19/2016 Document Reviewed: 07/01/2015 Elsevier Interactive Patient Education  2018 [place] in the Home Falls can cause injuries. They can happen to people of all ages. There are many things you can do to make your home safe and to help prevent falls. What can I do on the outside of my home?  Regularly fix the edges of walkways and driveways and fix any cracks.  Remove anything that might make you trip as you walk through a door, such as a raised step or threshold.  Trim any bushes or trees on the path to your home.  Use bright outdoor lighting.  Clear any walking paths of anything that might make someone trip, such as rocks or tools.  Regularly check to see if handrails are loose or broken. Make sure that both sides of any steps have handrails.  Any raised decks and porches should have guardrails on the edges.  Have any leaves, snow, or ice cleared regularly.  Use sand or salt on walking paths during winter.  Clean up any spills in your garage right away. This includes oil or grease spills. What can I do in the bathroom?  Use night lights.  Install grab bars by the toilet and in the tub and shower. Do not use towel bars as grab bars.  Use non-skid mats or decals in the tub or shower.  If you need to sit down in the shower, use a plastic, non-slip stool.  Keep the floor dry. Clean up any water that spills on the floor as soon as it happens.  Remove soap buildup in the tub or  shower regularly.  Attach bath mats securely with double-sided non-slip rug tape.  Do not have throw rugs and other things on the floor that can make you trip. What can I do in the bedroom?  Use night lights.  Make sure that you have a light by your bed that is easy to reach.  Do not use any sheets or blankets that are too big for your bed. They should not hang down onto the floor.  Have a firm chair that has side arms. You can use this for support while you get dressed.  Do not have throw rugs and other things on the floor that can make you trip. What can I do in the kitchen?  Clean up any spills right away.  Avoid walking on wet floors.  Keep items that you use a lot in easy-to-reach places.  If you need to reach something above you, use a strong step stool that has a grab bar.  Keep electrical cords out of the way.  Do not use floor polish or wax that makes floors slippery. If you must use wax, use non-skid floor wax.  Do not have throw rugs and other things on the floor that can make you trip. What can I do with my stairs?  Do not leave any items on the stairs.  Make sure that there are handrails on both sides of the stairs and use them. Fix handrails that are broken or loose. Make sure that handrails are as long as the stairways.  Check any carpeting to make sure that it is firmly attached to the stairs. Fix any carpet that is loose or worn.  Avoid having throw rugs at the top or bottom of the stairs. If you do have throw rugs, attach them to the floor with carpet tape.  Make sure that you have a light switch at the top of the stairs and the bottom of the stairs. If you do not have them, ask someone to add them for you. What else can I do to help prevent falls?  Wear shoes that: ? Do not have high heels. ? Have rubber bottoms. ? Are comfortable and fit you well. ? Are closed at the toe. Do not wear sandals.  If you use a stepladder: ? Make sure that it is fully  opened. Do not climb a closed stepladder. ? Make sure that both sides of the stepladder are locked into place. ? Ask someone to hold it for you, if possible.  Clearly mark and make sure that you can see: ? Any grab bars or handrails. ? First and last steps. ? Where the edge of each step is.  Use tools that help you move around (mobility aids) if they are needed. These include: ? Canes. ? Walkers. ? Scooters. ? Crutches.  Turn on the lights when you go into a dark area. Replace any light bulbs as soon as they burn out.  Set up your furniture so you have a clear path. Avoid moving your furniture around.  If any of your floors are uneven, fix them.  If there are any pets around you, be aware of where they are.  Review your medicines with your doctor. Some medicines can make you feel dizzy. This can increase your chance of falling. Ask your doctor what other things that you can do to help prevent falls. This information is not intended to replace advice given to you by your health care provider. Make sure you discuss any questions you have with your health care provider. Document Released: 06/26/2009 Document Revised: 02/05/2016 Document Reviewed: 10/04/2014 Elsevier Interactive Patient Education  [employer].

## 2017-11-13 ENCOUNTER — Other Ambulatory Visit: Payer: Self-pay | Admitting: Physician Assistant

## 2017-11-16 ENCOUNTER — Ambulatory Visit (INDEPENDENT_AMBULATORY_CARE_PROVIDER_SITE_OTHER): Payer: Self-pay

## 2017-11-16 ENCOUNTER — Encounter (INDEPENDENT_AMBULATORY_CARE_PROVIDER_SITE_OTHER): Payer: Self-pay | Admitting: Orthopaedic Surgery

## 2017-11-16 ENCOUNTER — Ambulatory Visit (INDEPENDENT_AMBULATORY_CARE_PROVIDER_SITE_OTHER): Payer: Medicare Other | Admitting: Orthopaedic Surgery

## 2017-11-16 VITALS — BP 121/78 | HR 69 | Resp 14 | Ht 72.0 in | Wt 225.0 lb

## 2017-11-16 DIAGNOSIS — M79642 Pain in left hand: Secondary | ICD-10-CM | POA: Diagnosis not present

## 2017-11-16 NOTE — Progress Notes (Signed)
Office Visit Note   Patient: Todd Zhang           Date of Birth: 1946/01/24           MRN: [id number] Visit Date: 11/16/2017              Requested by: Remus LofflerJones, Angel S, PA-C [address] [place], [place] [id number] PCP: Remus LofflerJones, Angel S, PA-C   Assessment & Plan: Visit Diagnoses:  1. Pain in left hand     Plan: About 2 months post injury to his left hand with a nondisplaced fracture of the left ring metacarpal. Has been immobilized in a splint. Last seen about 5 weeks ago. Missed last appointment. Plan removed. Has almost full range of motion of his fingers. No deformity. Films reveal healing of the fracture without any deformity or displacement. We'll work on range of motion exercises and return in 2 weeks if no improvement Follow-Up Instructions: Return if symptoms worsen or fail to improve.   Orders:  Orders Placed This Encounter  Procedures  . XR Hand Complete Left   No orders of the defined types were placed in this encounter.     Procedures: No procedures performed   Clinical Data: No additional findings.   Subjective: Chief Complaint  Patient presents with  . Left Hand - Follow-up    MR LSTER IS 72 Y O M HERE FOR F/U FOR L 4TH MC FX. STILL HAVING SOME PAIN AND STIFFNESS.   About 2 months status post nondisplaced fracture of left ring metacarpal and doing well in the splint. HPI  Review of Systems  Constitutional: Negative for fatigue and fever.  HENT: Positive for ear pain.   Eyes: Negative for pain.  Respiratory: Positive for cough and shortness of breath.   Cardiovascular: Positive for leg swelling. Negative for chest pain and palpitations.  Gastrointestinal: Negative for blood in stool, constipation and diarrhea.  Genitourinary: Positive for dysuria.  Musculoskeletal: Positive for back pain and neck pain.  Allergic/Immunologic: Negative for food allergies.  Neurological: Positive for dizziness, weakness and numbness.  Hematological: Bruises/bleeds  easily.  Psychiatric/Behavioral: Positive for sleep disturbance.     Objective: Vital Signs: BP 121/78 (BP Location: Left Arm, Patient Position: Sitting, Cuff Size: Normal)   Pulse 69   Resp 14   Ht 6' (1.829 m)   Wt 225 lb (102.1 kg)   BMI 30.52 kg/m   Physical Exam  Ortho Exam awake alert and oriented 3. Comfortable sitting. Splint was removed to the left upper extremity. Minimal tenderness over the ring metacarpal. No deformity. No swelling. Skin intact. Neurovascular exam intact. I touch the tips of his fingers to the palm of his hand.  Specialty Comments:  No specialty comments available.  Imaging: Xr Hand Complete Left  Result Date: 11/16/2017 Films of the left hand obtained in 3 projections. The previously identified fracture of the ring metacarpal left hand was identified. The fracture is in anatomic position. There is some callus. There are significant degenerative changes at the base of the thumb    PMFS History: Patient Active Problem List   Diagnosis Date Noted  . Closed nondisplaced fracture of shaft of fourth metacarpal bone of left hand 10/05/2017  . Gastroesophageal reflux disease without esophagitis 09/07/2017  . HTN (hypertension) 09/01/2017  . Type 2 diabetes mellitus with diabetic neuropathy, unspecified (HCC) 09/01/2017  . Onychomycosis 09/01/2017  . Atrial fibrillation (HCC) 09/01/2017  . Diabetes (HCC) 06/19/2014  . Back pain 06/19/2014  . Neck pain  06/19/2014  . Hereditary and idiopathic peripheral neuropathy 06/19/2014   Past Medical History:  Diagnosis Date  . Diabetes mellitus without complication (HCC)   . Hyperlipidemia   . Hypertension     History reviewed. No pertinent family history.  Past Surgical History:  Procedure Laterality Date  . AMPUTATION TOE    . KNEE SURGERY Left   . NOSE SURGERY     Social History   Occupational History  . Not on file  Tobacco Use  . Smoking status: Former Smoker    Packs/day: 1.00    Types:  Cigarettes    Start date: 06/19/1966    Last attempt to quit: 06/20/2011    Years since quitting: 6.4  . Smokeless tobacco: Never Used  Substance and Sexual Activity  . Alcohol use: No    Frequency: Never    Comment: quit drinking in 1982  . Drug use: Yes    Comment: marijuana and cocaine in the past, quit in 2012  . Sexual activity: Not on file

## 2017-11-23 ENCOUNTER — Other Ambulatory Visit: Payer: Self-pay | Admitting: Physician Assistant

## 2017-11-28 ENCOUNTER — Ambulatory Visit (INDEPENDENT_AMBULATORY_CARE_PROVIDER_SITE_OTHER): Payer: Medicare Other | Admitting: Physician Assistant

## 2017-11-28 ENCOUNTER — Encounter: Payer: Self-pay | Admitting: Physician Assistant

## 2017-11-28 VITALS — BP 99/60 | HR 81 | Temp 96.6°F | Ht 72.0 in | Wt 220.0 lb

## 2017-11-28 DIAGNOSIS — E1140 Type 2 diabetes mellitus with diabetic neuropathy, unspecified: Secondary | ICD-10-CM | POA: Diagnosis not present

## 2017-11-28 DIAGNOSIS — R3914 Feeling of incomplete bladder emptying: Secondary | ICD-10-CM | POA: Diagnosis not present

## 2017-11-28 DIAGNOSIS — R112 Nausea with vomiting, unspecified: Secondary | ICD-10-CM

## 2017-11-28 DIAGNOSIS — F411 Generalized anxiety disorder: Secondary | ICD-10-CM

## 2017-11-28 DIAGNOSIS — N401 Enlarged prostate with lower urinary tract symptoms: Secondary | ICD-10-CM | POA: Diagnosis not present

## 2017-11-28 DIAGNOSIS — G609 Hereditary and idiopathic neuropathy, unspecified: Secondary | ICD-10-CM | POA: Diagnosis not present

## 2017-11-28 DIAGNOSIS — Z794 Long term (current) use of insulin: Secondary | ICD-10-CM

## 2017-11-28 DIAGNOSIS — I10 Essential (primary) hypertension: Secondary | ICD-10-CM | POA: Diagnosis not present

## 2017-11-28 LAB — BAYER DCA HB A1C WAIVED: HB A1C (BAYER DCA - WAIVED): 7 % — ABNORMAL HIGH (ref <7.0)

## 2017-11-28 MED ORDER — TAMSULOSIN HCL 0.4 MG PO CAPS: 0.4000 mg | ORAL_CAPSULE | Freq: Every day | ORAL | 11 refills | Status: DC

## 2017-11-28 MED ORDER — ALPRAZOLAM 0.5 MG PO TABS: 0.5000 mg | ORAL_TABLET | Freq: Every day | ORAL | 5 refills | Status: AC

## 2017-11-28 MED ORDER — OXYCODONE-ACETAMINOPHEN 7.5-325 MG PO TABS: 1.0000 | ORAL_TABLET | ORAL | 0 refills | Status: AC | PRN

## 2017-11-28 MED ORDER — OXYCODONE-ACETAMINOPHEN 7.5-325 MG PO TABS: 1.0000 | ORAL_TABLET | ORAL | 0 refills | Status: DC | PRN

## 2017-11-28 NOTE — Patient Instructions (Signed)
In a few days you may receive a survey in the mail or online from [employer] regarding your visit with us today. Please take a moment to fill this out. Your feedback is very important to our whole office. It can help us better understand your needs as well as improve your experience and satisfaction. Thank you for taking your time to complete it. We care about you.  Miriam Kestler, PA-C  

## 2017-11-29 DIAGNOSIS — F411 Generalized anxiety disorder: Secondary | ICD-10-CM | POA: Insufficient documentation

## 2017-11-29 LAB — CBC WITH DIFFERENTIAL/PLATELET
Basophils Absolute: 0 10*3/uL (ref 0.0–0.2)
Basos: 0 %
EOS (ABSOLUTE): 0.1 10*3/uL (ref 0.0–0.4)
Eos: 1 %
Hematocrit: 38.8 % (ref 37.5–51.0)
Hemoglobin: 13.9 g/dL (ref 13.0–17.7)
Immature Grans (Abs): 0 10*3/uL (ref 0.0–0.1)
Immature Granulocytes: 0 %
Lymphocytes Absolute: 3.1 10*3/uL (ref 0.7–3.1)
Lymphs: 33 %
MCH: 31.7 pg (ref 26.6–33.0)
MCHC: 35.8 g/dL — ABNORMAL HIGH (ref 31.5–35.7)
MCV: 88 fL (ref 79–97)
Monocytes Absolute: 0.7 10*3/uL (ref 0.1–0.9)
Monocytes: 8 %
Neutrophils Absolute: 5.5 10*3/uL (ref 1.4–7.0)
Neutrophils: 58 %
Platelets: 220 10*3/uL (ref 150–379)
RBC: 4.39 x10E6/uL (ref 4.14–5.80)
RDW: 14.7 % (ref 12.3–15.4)
WBC: 9.5 10*3/uL (ref 3.4–10.8)

## 2017-11-29 LAB — PSA: Prostate Specific Ag, Serum: 0.6 ng/mL (ref 0.0–4.0)

## 2017-11-29 LAB — LIPID PANEL
Chol/HDL Ratio: 3.8 ratio (ref 0.0–5.0)
Cholesterol, Total: 219 mg/dL — ABNORMAL HIGH (ref 100–199)
HDL: 57 mg/dL (ref >39)
LDL Calculated: 120 mg/dL — ABNORMAL HIGH (ref 0–99)
Triglycerides: 208 mg/dL — ABNORMAL HIGH (ref 0–149)
VLDL Cholesterol Cal: 42 mg/dL — ABNORMAL HIGH (ref 5–40)

## 2017-11-29 LAB — CMP14+EGFR
ALT: 11 IU/L (ref 0–44)
AST: 16 IU/L (ref 0–40)
Albumin/Globulin Ratio: 1.8 (ref 1.2–2.2)
Albumin: 4.6 g/dL (ref 3.5–4.8)
Alkaline Phosphatase: 75 IU/L (ref 39–117)
BUN/Creatinine Ratio: 16 (ref 10–24)
BUN: 20 mg/dL (ref 8–27)
Bilirubin Total: 0.5 mg/dL (ref 0.0–1.2)
CO2: 23 mmol/L (ref 20–29)
Calcium: 10 mg/dL (ref 8.6–10.2)
Chloride: 99 mmol/L (ref 96–106)
Creatinine, Ser: 1.24 mg/dL (ref 0.76–1.27)
GFR calc Af Amer: 67 mL/min/{1.73_m2} (ref >59)
GFR calc non Af Amer: 58 mL/min/{1.73_m2} — ABNORMAL LOW (ref >59)
Globulin, Total: 2.5 g/dL (ref 1.5–4.5)
Glucose: 158 mg/dL — ABNORMAL HIGH (ref 65–99)
Potassium: 5.2 mmol/L (ref 3.5–5.2)
Sodium: 139 mmol/L (ref 134–144)
Total Protein: 7.1 g/dL (ref 6.0–8.5)

## 2017-11-29 NOTE — Progress Notes (Signed)
BP 99/60 (BP Location: Left Arm)   Pulse 81   Temp (!) 96.6 F (35.9 C) (Oral)   Ht 6' (1.829 m)   Wt 220 lb (99.8 kg)   BMI 29.84 kg/m    Subjective:    Patient ID: Todd Zhang, male    DOB: [birth year], 72 y.o.   MRN: [id number]  HPI: Todd Zhang is a 72 y.o. male presenting on 11/28/2017 for Follow-up (2 mo)  This patient comes in for recheck on his medical conditions and medications.  He is starting to have difficulty with starting his urine flow.  He is getting up frequently at night to go.  He states that his stream is much weaker overall.  He has never been evaluated for prostate abnormalities.  He does not know of any family history of prostate cancer. He is having some nausea and vomiting this morning but is starting to feel some better.  He thinks he may have gotten around someone else who is sick.  He is also here for his chronic conditions of hypertension, generalized anxiety, type 2 diabetes, peripheral neuropathy.  All of his medications are reviewed.  He will have labs drawn today.  He did go through upright of his left hand.  And his Britt Bolognese is still can have one more visit to release him.    Past Medical History:  Diagnosis Date  . Diabetes mellitus without complication ([place])   . Hyperlipidemia   . Hypertension    Relevant past medical, surgical, family and social history reviewed and updated as indicated. Interim medical history since our last visit reviewed. Allergies and medications reviewed and updated. DATA REVIEWED: CHART IN EPIC  Family History reviewed for pertinent findings.  Review of Systems  Allergies as of 11/28/2017      Reactions   Asa [aspirin] Hives   Penicillins Hives      Medication List        Accurate as of 11/28/17 11:59 PM. Always use your most recent med list.          alendronate 70 MG tablet Commonly known as:  FOSAMAX TAKE 1 TABLET BY MOUTH EVERY WEEK   ALPRAZolam 0.5 MG tablet Commonly known as:  XANAX Take 1 tablet  (0.5 mg total) by mouth at bedtime.   atorvastatin 40 MG tablet Commonly known as:  LIPITOR Take 40 mg by mouth daily.   benazepril 5 MG tablet Commonly known as:  LOTENSIN Take 5 mg by mouth daily.   gemfibrozil 600 MG tablet Commonly known as:  LOPID Take 600 mg by mouth 2 (two) times daily.   GLOBAL INJECT EASE INSULIN SYR 31G X 5/16" 0.3 ML Misc Generic drug:  Insulin Syringe-Needle U-100 USE TO INJECT THREE TIMES DAILY   HUMULIN R 100 units/mL injection Generic drug:  insulin regular INJECT 10 UNITS THREE TIMES DAILY WITH MEALS   metFORMIN 500 MG tablet Commonly known as:  GLUCOPHAGE Take 500 mg by mouth 2 (two) times daily.   metoprolol succinate 50 MG 24 hr tablet Commonly known as:  TOPROL-XL Take 50 mg by mouth daily.   oxyCODONE-acetaminophen 7.5-325 MG tablet Commonly known as:  PERCOCET Take 1 tablet by mouth every 4 (four) hours as needed for severe pain.   oxyCODONE-acetaminophen 7.5-325 MG tablet Commonly known as:  PERCOCET Take 1 tablet by mouth every 4 (four) hours as needed for severe pain. Start taking on:  12/27/2017   ranitidine 300 MG tablet Commonly known as:  ZANTAC Take  300 mg by mouth daily.   tamsulosin 0.4 MG Caps capsule Commonly known as:  FLOMAX Take 1 capsule (0.4 mg total) by mouth daily.   XARELTO 20 MG Tabs tablet Generic drug:  rivaroxaban Take 20 mg by mouth daily.          Objective:    BP 99/60 (BP Location: Left Arm)   Pulse 81   Temp (!) 96.6 F (35.9 C) (Oral)   Ht 6' (1.829 m)   Wt 220 lb (99.8 kg)   BMI 29.84 kg/m   Allergies  Allergen Reactions  . Asa [Aspirin] Hives  . Penicillins Hives    Wt Readings from Last 3 Encounters:  11/28/17 220 lb (99.8 kg)  11/16/17 225 lb (102.1 kg)  10/26/17 224 lb (101.6 kg)    Physical Exam  Results for orders placed or performed in visit on 11/28/17  PSA  Result Value Ref Range   Prostate Specific Ag, Serum 0.6 0.0 - 4.0 ng/mL  CBC with  Differential/Platelet  Result Value Ref Range   WBC 9.5 3.4 - 10.8 x10E3/uL   RBC 4.39 4.14 - 5.80 x10E6/uL   Hemoglobin 13.9 13.0 - 17.7 g/dL   Hematocrit 38.8 37.5 - 51.0 %   MCV 88 79 - 97 fL   MCH 31.7 26.6 - 33.0 pg   MCHC 35.8 (H) 31.5 - 35.7 g/dL   RDW 14.7 12.3 - 15.4 %   Platelets 220 150 - 379 x10E3/uL   Neutrophils 58 Not Estab. %   Lymphs 33 Not Estab. %   Monocytes 8 Not Estab. %   Eos 1 Not Estab. %   Basos 0 Not Estab. %   Neutrophils Absolute 5.5 1.4 - 7.0 x10E3/uL   Lymphocytes Absolute 3.1 0.7 - 3.1 x10E3/uL   Monocytes Absolute 0.7 0.1 - 0.9 x10E3/uL   EOS (ABSOLUTE) 0.1 0.0 - 0.4 x10E3/uL   Basophils Absolute 0.0 0.0 - 0.2 x10E3/uL   Immature Granulocytes 0 Not Estab. %   Immature Grans (Abs) 0.0 0.0 - 0.1 x10E3/uL  CMP14+EGFR  Result Value Ref Range   Glucose 158 (H) 65 - 99 mg/dL   BUN 20 8 - 27 mg/dL   Creatinine, Ser 1.24 0.76 - 1.27 mg/dL   GFR calc non Af Amer 58 (L) >59 mL/min/1.73   GFR calc Af Amer 67 >59 mL/min/1.73   BUN/Creatinine Ratio 16 10 - 24   Sodium 139 134 - 144 mmol/L   Potassium 5.2 3.5 - 5.2 mmol/L   Chloride 99 96 - 106 mmol/L   CO2 23 20 - 29 mmol/L   Calcium 10.0 8.6 - 10.2 mg/dL   Total Protein 7.1 6.0 - 8.5 g/dL   Albumin 4.6 3.5 - 4.8 g/dL   Globulin, Total 2.5 1.5 - 4.5 g/dL   Albumin/Globulin Ratio 1.8 1.2 - 2.2   Bilirubin Total 0.5 0.0 - 1.2 mg/dL   Alkaline Phosphatase 75 39 - 117 IU/L   AST 16 0 - 40 IU/L   ALT 11 0 - 44 IU/L  Lipid panel  Result Value Ref Range   Cholesterol, Total 219 (H) 100 - 199 mg/dL   Triglycerides 208 (H) 0 - 149 mg/dL   HDL 57 >39 mg/dL   VLDL Cholesterol Cal 42 (H) 5 - 40 mg/dL   LDL Calculated 120 (H) 0 - 99 mg/dL   Chol/HDL Ratio 3.8 0.0 - 5.0 ratio  Bayer DCA Hb A1c Waived  Result Value Ref Range   Bayer DCA Hb A1c Waived  7.0 (H) <7.0 %      Assessment & Plan:   1. Benign prostatic hyperplasia with incomplete bladder emptying - tamsulosin (FLOMAX) 0.4 MG CAPS capsule; Take  1 capsule (0.4 mg total) by mouth daily.  Dispense: 30 capsule; Refill: 11 - PSA  2. Nausea and vomiting, intractability of vomiting not specified, unspecified vomiting type - CBC with Differential/Platelet  3. Hereditary and idiopathic peripheral neuropathy - oxyCODONE-acetaminophen (PERCOCET) 7.5-325 MG tablet; Take 1 tablet by mouth every 4 (four) hours as needed for severe pain.  Dispense: 120 tablet; Refill: 0 - oxyCODONE-acetaminophen (PERCOCET) 7.5-325 MG tablet; Take 1 tablet by mouth every 4 (four) hours as needed for severe pain.  Dispense: 120 tablet; Refill: 0  4. Essential hypertension - CBC with Differential/Platelet - CMP14+EGFR - Lipid panel - Bayer DCA Hb A1c Waived  5. Generalized anxiety disorder - ALPRAZolam (XANAX) 0.5 MG tablet; Take 1 tablet (0.5 mg total) by mouth at bedtime.  Dispense: 30 tablet; Refill: 5  6. Type 2 diabetes mellitus with diabetic neuropathy, with long-term current use of insulin (HCC)   Continue all other maintenance medications as listed above.  Follow up plan: Return in about 2 months (around 01/28/2018) for recheck.  Educational handout given for [place] PA-C [place] [address]  [place], [place] [address] [phone]   11/29/2017, 1:32 PM

## 2017-11-30 ENCOUNTER — Other Ambulatory Visit: Payer: Self-pay | Admitting: Physician Assistant

## 2017-11-30 DIAGNOSIS — F411 Generalized anxiety disorder: Secondary | ICD-10-CM

## 2017-11-30 DIAGNOSIS — I10 Essential (primary) hypertension: Secondary | ICD-10-CM

## 2017-11-30 DIAGNOSIS — Z794 Long term (current) use of insulin: Secondary | ICD-10-CM

## 2017-11-30 DIAGNOSIS — E1140 Type 2 diabetes mellitus with diabetic neuropathy, unspecified: Secondary | ICD-10-CM

## 2017-11-30 DIAGNOSIS — I482 Chronic atrial fibrillation, unspecified: Secondary | ICD-10-CM

## 2017-11-30 DIAGNOSIS — K219 Gastro-esophageal reflux disease without esophagitis: Secondary | ICD-10-CM

## 2017-12-07 MED ORDER — METFORMIN HCL 500 MG PO TABS: 500.0000 mg | ORAL_TABLET | Freq: Two times a day (BID) | ORAL | 5 refills | Status: DC

## 2017-12-07 MED ORDER — XARELTO 20 MG PO TABS: 20.0000 mg | ORAL_TABLET | Freq: Every day | ORAL | 5 refills | Status: DC

## 2017-12-07 MED ORDER — ALENDRONATE SODIUM 70 MG PO TABS: 70.0000 mg | ORAL_TABLET | ORAL | 5 refills | Status: DC

## 2017-12-07 MED ORDER — ATORVASTATIN CALCIUM 40 MG PO TABS: 40.0000 mg | ORAL_TABLET | Freq: Every day | ORAL | 5 refills | Status: DC

## 2017-12-07 MED ORDER — METOPROLOL SUCCINATE ER 50 MG PO TB24: 50.0000 mg | ORAL_TABLET | Freq: Every day | ORAL | 5 refills | Status: DC

## 2017-12-07 MED ORDER — BENAZEPRIL HCL 5 MG PO TABS: 5.0000 mg | ORAL_TABLET | Freq: Every day | ORAL | 5 refills | Status: DC

## 2017-12-07 MED ORDER — RANITIDINE HCL 300 MG PO TABS: 300.0000 mg | ORAL_TABLET | Freq: Every day | ORAL | 5 refills | Status: DC

## 2017-12-07 NOTE — Telephone Encounter (Signed)
What is the name of the medication? XARELTO 20 MG TABS tablet atorvastatin (LIPITOR) 40 MG tablet gemfibrozil (LOPID) 600 MG tablet metFORMIN (GLUCOPHAGE) 500 MG tablet benazepril (LOTENSIN) 5 MG tablet ranitidine (ZANTAC) 300 MG tablet metoprolol succinate (TOPROL-XL) 50 MG 24 hr tablet alendronate (FOSAMAX) 70 MG tablet ALPRAZolam (XANAX) 0.5 MG tablet  Have you contacted your pharmacy to request a refill? No   Which pharmacy would you like this sent to? [facility]   Patient notified that their request is being sent to the clinical staff for review and that they should receive a call once it is complete. If they do not receive a call within 24 hours they can check with their pharmacy or our office.

## 2017-12-21 ENCOUNTER — Other Ambulatory Visit: Payer: Self-pay | Admitting: Physician Assistant

## 2017-12-21 DIAGNOSIS — E1140 Type 2 diabetes mellitus with diabetic neuropathy, unspecified: Secondary | ICD-10-CM

## 2017-12-21 DIAGNOSIS — Z794 Long term (current) use of insulin: Principal | ICD-10-CM

## 2018-01-20 ENCOUNTER — Telehealth: Payer: Self-pay | Admitting: Physician Assistant

## 2018-01-20 ENCOUNTER — Other Ambulatory Visit: Payer: Self-pay | Admitting: Physician Assistant

## 2018-01-20 DIAGNOSIS — G609 Hereditary and idiopathic neuropathy, unspecified: Secondary | ICD-10-CM

## 2018-01-20 MED ORDER — ALPRAZOLAM 0.5 MG PO TABS
0.5000 mg | ORAL_TABLET | Freq: Every evening | ORAL | 0 refills | Status: DC | PRN
Start: 2018-01-20 — End: 2018-01-31

## 2018-01-20 MED ORDER — OXYCODONE-ACETAMINOPHEN 7.5-325 MG PO TABS: 1.0000 | ORAL_TABLET | ORAL | 0 refills | Status: DC | PRN

## 2018-01-20 NOTE — Telephone Encounter (Signed)
Medication sent, keep appt

## 2018-01-20 NOTE — Telephone Encounter (Signed)
Aware and verbalized understanding.

## 2018-01-31 ENCOUNTER — Ambulatory Visit (INDEPENDENT_AMBULATORY_CARE_PROVIDER_SITE_OTHER): Payer: Medicare Other | Admitting: Physician Assistant

## 2018-01-31 ENCOUNTER — Encounter: Payer: Self-pay | Admitting: Physician Assistant

## 2018-01-31 VITALS — BP 118/81 | HR 61 | Temp 98.9°F | Ht 72.0 in | Wt 236.8 lb

## 2018-01-31 DIAGNOSIS — E1140 Type 2 diabetes mellitus with diabetic neuropathy, unspecified: Secondary | ICD-10-CM | POA: Diagnosis not present

## 2018-01-31 DIAGNOSIS — G609 Hereditary and idiopathic neuropathy, unspecified: Secondary | ICD-10-CM

## 2018-01-31 DIAGNOSIS — I10 Essential (primary) hypertension: Secondary | ICD-10-CM

## 2018-01-31 DIAGNOSIS — Z794 Long term (current) use of insulin: Secondary | ICD-10-CM | POA: Diagnosis not present

## 2018-01-31 MED ORDER — OXYCODONE-ACETAMINOPHEN 7.5-325 MG PO TABS: 1.0000 | ORAL_TABLET | ORAL | 0 refills | Status: DC | PRN

## 2018-01-31 MED ORDER — OXYCODONE-ACETAMINOPHEN 7.5-325 MG PO TABS
1.0000 | ORAL_TABLET | ORAL | 0 refills | Status: AC | PRN
Start: 2018-01-31 — End: 2018-03-01

## 2018-01-31 MED ORDER — ALPRAZOLAM 0.5 MG PO TABS: 0.5000 mg | ORAL_TABLET | Freq: Every evening | ORAL | 5 refills | Status: DC | PRN

## 2018-02-01 NOTE — Progress Notes (Signed)
BP 118/81   Pulse 61   Temp 98.9 F (37.2 C) (Oral)   Ht 6' (1.829 m)   Wt 236 lb 12.8 oz (107.4 kg)   BMI 32.12 kg/m     Subjective:    Patient ID: Todd Zhang, male    DOB: 10-29-1945, 72 y.o.   MRN: [id number]  HPI: Todd Zhang is a 72 y.o. male presenting on 01/31/2018 for Diabetes and Hypertension  This patient comes in for a 19-monthrecheck on his chronic medical conditions.  They are positive for chronic painful neuropathy, hypertension, type 2 diabetes with neuropathy.  He states overall his been fairly well controlled with his medications.  He denies any issues at this time.  Refills need to be sent and we will plan for lab work in the next visit.   Past Medical History:  Diagnosis Date  . Diabetes mellitus without complication ([place]   . Hyperlipidemia   . Hypertension    Relevant past medical, surgical, family and social history reviewed and updated as indicated. Interim medical history since our last visit reviewed. Allergies and medications reviewed and updated. DATA REVIEWED: CHART IN EPIC  Family History reviewed for pertinent findings.  Review of Systems  Constitutional: Negative.  Negative for appetite change and fatigue.  HENT: Negative.   Eyes: Negative.  Negative for pain and visual disturbance.  Respiratory: Negative.  Negative for cough, chest tightness, shortness of breath and wheezing.   Cardiovascular: Negative.  Negative for chest pain, palpitations and leg swelling.  Gastrointestinal: Negative.  Negative for abdominal pain, diarrhea, nausea and vomiting.  Endocrine: Negative.   Genitourinary: Negative.   Musculoskeletal: Negative.   Skin: Negative.  Negative for color change and rash.  Neurological: Negative.  Negative for weakness, numbness and headaches.  Psychiatric/Behavioral: Negative.     Allergies as of 01/31/2018      Reactions   Asa [aspirin] Hives   Penicillins Hives      Medication List        Accurate as of 01/31/18 11:59  PM. Always use your most recent med list.          alendronate 70 MG tablet Commonly known as:  FOSAMAX Take 1 tablet (70 mg total) by mouth once a week. Take with a full glass of water on an empty stomach.   ALPRAZolam 0.5 MG tablet Commonly known as:  XANAX Take 1 tablet (0.5 mg total) by mouth at bedtime as needed for anxiety.   atorvastatin 40 MG tablet Commonly known as:  LIPITOR Take 1 tablet (40 mg total) by mouth daily.   benazepril 5 MG tablet Commonly known as:  LOTENSIN Take 1 tablet (5 mg total) by mouth daily.   gemfibrozil 600 MG tablet Commonly known as:  LOPID Take 600 mg by mouth 2 (two) times daily.   GLOBAL INJECT EASE INSULIN SYR 31G X 5/16" 0.3 ML Misc Generic drug:  Insulin Syringe-Needle U-100 USE TO INJECT THREE TIMES DAILY   HUMULIN R 100 units/mL injection Generic drug:  insulin regular INJECT 10 UNITS THREE TIMES DAILY WITH MEALS   metFORMIN 500 MG tablet Commonly known as:  GLUCOPHAGE Take 1 tablet (500 mg total) by mouth 2 (two) times daily.   metoprolol succinate 50 MG 24 hr tablet Commonly known as:  TOPROL-XL Take 1 tablet (50 mg total) by mouth daily.   oxyCODONE-acetaminophen 7.5-325 MG tablet Commonly known as:  PERCOCET Take 1 tablet by mouth every 4 (four) hours as needed for severe  pain.   oxyCODONE-acetaminophen 7.5-325 MG tablet Commonly known as:  PERCOCET Take 1 tablet by mouth every 4 (four) hours as needed for severe pain.   oxyCODONE-acetaminophen 7.5-325 MG tablet Commonly known as:  PERCOCET Take 1 tablet by mouth every 4 (four) hours as needed for severe pain.   ranitidine 300 MG tablet Commonly known as:  ZANTAC Take 1 tablet (300 mg total) by mouth daily.   tamsulosin 0.4 MG Caps capsule Commonly known as:  FLOMAX Take 1 capsule (0.4 mg total) by mouth daily.   XARELTO 20 MG Tabs tablet Generic drug:  rivaroxaban Take 1 tablet (20 mg total) by mouth daily.          Objective:    BP 118/81   Pulse  61   Temp 98.9 F (37.2 C) (Oral)   Ht 6' (1.829 m)   Wt 236 lb 12.8 oz (107.4 kg)   BMI 32.12 kg/m    Allergies  Allergen Reactions  . Asa [Aspirin] Hives  . Penicillins Hives    Wt Readings from Last 3 Encounters:  01/31/18 236 lb 12.8 oz (107.4 kg)  11/28/17 220 lb (99.8 kg)  11/16/17 225 lb (102.1 kg)    Physical Exam  Constitutional: He appears well-developed and well-nourished. No distress.  HENT:  Head: Normocephalic and atraumatic.  Eyes: Pupils are equal, round, and reactive to light. Conjunctivae and EOM are normal.  Cardiovascular: Normal rate, regular rhythm and normal heart sounds.  Pulmonary/Chest: Effort normal and breath sounds normal. No respiratory distress.  Skin: Skin is warm and dry.  Psychiatric: He has a normal mood and affect. His behavior is normal.  Nursing note and vitals reviewed.   Results for orders placed or performed in visit on 11/28/17  PSA  Result Value Ref Range   Prostate Specific Ag, Serum 0.6 0.0 - 4.0 ng/mL  CBC with Differential/Platelet  Result Value Ref Range   WBC 9.5 3.4 - 10.8 x10E3/uL   RBC 4.39 4.14 - 5.80 x10E6/uL   Hemoglobin 13.9 13.0 - 17.7 g/dL   Hematocrit 38.8 37.5 - 51.0 %   MCV 88 79 - 97 fL   MCH 31.7 26.6 - 33.0 pg   MCHC 35.8 (H) 31.5 - 35.7 g/dL   RDW 14.7 12.3 - 15.4 %   Platelets 220 150 - 379 x10E3/uL   Neutrophils 58 Not Estab. %   Lymphs 33 Not Estab. %   Monocytes 8 Not Estab. %   Eos 1 Not Estab. %   Basos 0 Not Estab. %   Neutrophils Absolute 5.5 1.4 - 7.0 x10E3/uL   Lymphocytes Absolute 3.1 0.7 - 3.1 x10E3/uL   Monocytes Absolute 0.7 0.1 - 0.9 x10E3/uL   EOS (ABSOLUTE) 0.1 0.0 - 0.4 x10E3/uL   Basophils Absolute 0.0 0.0 - 0.2 x10E3/uL   Immature Granulocytes 0 Not Estab. %   Immature Grans (Abs) 0.0 0.0 - 0.1 x10E3/uL  CMP14+EGFR  Result Value Ref Range   Glucose 158 (H) 65 - 99 mg/dL   BUN 20 8 - 27 mg/dL   Creatinine, Ser 1.24 0.76 - 1.27 mg/dL   GFR calc non Af Amer 58 (L) >59  mL/min/1.73   GFR calc Af Amer 67 >59 mL/min/1.73   BUN/Creatinine Ratio 16 10 - 24   Sodium 139 134 - 144 mmol/L   Potassium 5.2 3.5 - 5.2 mmol/L   Chloride 99 96 - 106 mmol/L   CO2 23 20 - 29 mmol/L   Calcium 10.0 8.6 - 10.2 mg/dL  Total Protein 7.1 6.0 - 8.5 g/dL   Albumin 4.6 3.5 - 4.8 g/dL   Globulin, Total 2.5 1.5 - 4.5 g/dL   Albumin/Globulin Ratio 1.8 1.2 - 2.2   Bilirubin Total 0.5 0.0 - 1.2 mg/dL   Alkaline Phosphatase 75 39 - 117 IU/L   AST 16 0 - 40 IU/L   ALT 11 0 - 44 IU/L  Lipid panel  Result Value Ref Range   Cholesterol, Total 219 (H) 100 - 199 mg/dL   Triglycerides 208 (H) 0 - 149 mg/dL   HDL 57 >39 mg/dL   VLDL Cholesterol Cal 42 (H) 5 - 40 mg/dL   LDL Calculated 120 (H) 0 - 99 mg/dL   Chol/HDL Ratio 3.8 0.0 - 5.0 ratio  Bayer DCA Hb A1c Waived  Result Value Ref Range   HB A1C (BAYER DCA - WAIVED) 7.0 (H) <7.0 %      Assessment & Plan:   1. Hereditary and idiopathic peripheral neuropathy - oxyCODONE-acetaminophen (PERCOCET) 7.5-325 MG tablet; Take 1 tablet by mouth every 4 (four) hours as needed for severe pain.  Dispense: 120 tablet; Refill: 0  2. Essential hypertension Continue medications Low-sodium diet  3. Type 2 diabetes mellitus with diabetic neuropathy, with long-term current use of insulin (HCC) Continue medications Carb counting reviewed   Continue all other maintenance medications as listed above.  Follow up plan: Return in about 3 months (around 05/03/2018) for recheck and labs.  Educational handout given for [place] PA-C [place] [address]  [place], [place] [address] [phone]   02/01/2018, 11:14 AM

## 2018-02-09 ENCOUNTER — Other Ambulatory Visit: Payer: Self-pay | Admitting: Physician Assistant

## 2018-02-09 DIAGNOSIS — E1140 Type 2 diabetes mellitus with diabetic neuropathy, unspecified: Secondary | ICD-10-CM

## 2018-02-09 DIAGNOSIS — Z794 Long term (current) use of insulin: Principal | ICD-10-CM

## 2018-04-16 ENCOUNTER — Other Ambulatory Visit: Payer: Self-pay | Admitting: Physician Assistant

## 2018-04-16 DIAGNOSIS — E1140 Type 2 diabetes mellitus with diabetic neuropathy, unspecified: Secondary | ICD-10-CM

## 2018-04-16 DIAGNOSIS — Z794 Long term (current) use of insulin: Principal | ICD-10-CM

## 2018-05-03 ENCOUNTER — Encounter: Payer: Self-pay | Admitting: Physician Assistant

## 2018-05-03 ENCOUNTER — Ambulatory Visit (INDEPENDENT_AMBULATORY_CARE_PROVIDER_SITE_OTHER): Payer: Medicare Other | Admitting: Physician Assistant

## 2018-05-03 DIAGNOSIS — I482 Chronic atrial fibrillation, unspecified: Secondary | ICD-10-CM

## 2018-05-03 DIAGNOSIS — E1140 Type 2 diabetes mellitus with diabetic neuropathy, unspecified: Secondary | ICD-10-CM | POA: Diagnosis not present

## 2018-05-03 DIAGNOSIS — K219 Gastro-esophageal reflux disease without esophagitis: Secondary | ICD-10-CM

## 2018-05-03 DIAGNOSIS — Z794 Long term (current) use of insulin: Secondary | ICD-10-CM

## 2018-05-03 DIAGNOSIS — I10 Essential (primary) hypertension: Secondary | ICD-10-CM

## 2018-05-03 LAB — CBC WITH DIFFERENTIAL/PLATELET
Basophils Absolute: 0 10*3/uL (ref 0.0–0.2)
Basos: 0 %
EOS (ABSOLUTE): 0.1 10*3/uL (ref 0.0–0.4)
Eos: 2 %
Hematocrit: 40.9 % (ref 37.5–51.0)
Hemoglobin: 13.6 g/dL (ref 13.0–17.7)
Immature Grans (Abs): 0 10*3/uL (ref 0.0–0.1)
Immature Granulocytes: 0 %
Lymphocytes Absolute: 3 10*3/uL (ref 0.7–3.1)
Lymphs: 40 %
MCH: 30.5 pg (ref 26.6–33.0)
MCHC: 33.3 g/dL (ref 31.5–35.7)
MCV: 92 fL (ref 79–97)
Monocytes Absolute: 0.6 10*3/uL (ref 0.1–0.9)
Monocytes: 8 %
Neutrophils Absolute: 3.8 10*3/uL (ref 1.4–7.0)
Neutrophils: 50 %
Platelets: 174 10*3/uL (ref 150–450)
RBC: 4.46 x10E6/uL (ref 4.14–5.80)
RDW: 14.3 % (ref 12.3–15.4)
WBC: 7.6 10*3/uL (ref 3.4–10.8)

## 2018-05-03 LAB — CMP14+EGFR
ALT: 18 IU/L (ref 0–44)
AST: 18 IU/L (ref 0–40)
Albumin/Globulin Ratio: 1.8 (ref 1.2–2.2)
Albumin: 4.2 g/dL (ref 3.5–4.8)
Alkaline Phosphatase: 80 IU/L (ref 39–117)
BUN/Creatinine Ratio: 18 (ref 10–24)
BUN: 24 mg/dL (ref 8–27)
Bilirubin Total: 0.6 mg/dL (ref 0.0–1.2)
CO2: 25 mmol/L (ref 20–29)
Calcium: 9.7 mg/dL (ref 8.6–10.2)
Chloride: 96 mmol/L (ref 96–106)
Creatinine, Ser: 1.3 mg/dL — ABNORMAL HIGH (ref 0.76–1.27)
GFR calc Af Amer: 63 mL/min/{1.73_m2} (ref >59)
GFR calc non Af Amer: 55 mL/min/{1.73_m2} — ABNORMAL LOW (ref >59)
Globulin, Total: 2.3 g/dL (ref 1.5–4.5)
Glucose: 291 mg/dL — ABNORMAL HIGH (ref 65–99)
Potassium: 4.7 mmol/L (ref 3.5–5.2)
Sodium: 134 mmol/L (ref 134–144)
Total Protein: 6.5 g/dL (ref 6.0–8.5)

## 2018-05-03 LAB — LIPID PANEL
Chol/HDL Ratio: 5.8 ratio — ABNORMAL HIGH (ref 0.0–5.0)
Cholesterol, Total: 226 mg/dL — ABNORMAL HIGH (ref 100–199)
HDL: 39 mg/dL — ABNORMAL LOW (ref >39)
LDL Calculated: 114 mg/dL — ABNORMAL HIGH (ref 0–99)
Triglycerides: 363 mg/dL — ABNORMAL HIGH (ref 0–149)
VLDL Cholesterol Cal: 73 mg/dL — ABNORMAL HIGH (ref 5–40)

## 2018-05-03 LAB — BAYER DCA HB A1C WAIVED: HB A1C (BAYER DCA - WAIVED): 8 % — ABNORMAL HIGH (ref <7.0)

## 2018-05-03 MED ORDER — BENAZEPRIL HCL 5 MG PO TABS: 5.0000 mg | ORAL_TABLET | Freq: Every day | ORAL | 5 refills | Status: DC

## 2018-05-03 MED ORDER — ALENDRONATE SODIUM 70 MG PO TABS: 70.0000 mg | ORAL_TABLET | ORAL | 5 refills | Status: DC

## 2018-05-03 MED ORDER — ATORVASTATIN CALCIUM 40 MG PO TABS: 40.0000 mg | ORAL_TABLET | Freq: Every day | ORAL | 5 refills | Status: DC

## 2018-05-03 MED ORDER — METFORMIN HCL 500 MG PO TABS: 500.0000 mg | ORAL_TABLET | Freq: Two times a day (BID) | ORAL | 5 refills | Status: DC

## 2018-05-03 MED ORDER — RANITIDINE HCL 300 MG PO TABS: 300.0000 mg | ORAL_TABLET | Freq: Every day | ORAL | 5 refills | Status: DC

## 2018-05-03 MED ORDER — METOPROLOL SUCCINATE ER 50 MG PO TB24: 50.0000 mg | ORAL_TABLET | Freq: Every day | ORAL | 5 refills | Status: DC

## 2018-05-03 MED ORDER — OXYCODONE-ACETAMINOPHEN 7.5-325 MG PO TABS: 1.0000 | ORAL_TABLET | ORAL | 0 refills | Status: DC | PRN

## 2018-05-03 MED ORDER — XARELTO 20 MG PO TABS: 20.0000 mg | ORAL_TABLET | Freq: Every day | ORAL | 5 refills | Status: DC

## 2018-05-03 MED ORDER — INSULIN REGULAR HUMAN 100 UNIT/ML IJ SOLN: INTRAMUSCULAR | 0 refills | Status: DC

## 2018-05-04 LAB — MICROALBUMIN / CREATININE URINE RATIO
Creatinine, Urine: 118.2 mg/dL
Microalb/Creat Ratio: 3.1 mg/g creat (ref 0.0–30.0)
Microalbumin, Urine: 3.7 ug/mL

## 2018-05-08 NOTE — Progress Notes (Signed)
BP 113/69   Pulse 84   Temp 97.6 F (36.4 C) (Oral)   Ht 6' (1.829 m)   Wt 240 lb (108.9 kg)   BMI 32.55 kg/m    Subjective:    Patient ID: Todd Zhang, male    DOB: 01-02-1946, 72 y.o.   MRN: [id number]  HPI: Todd Zhang is a 72 y.o. male presenting on 05/03/2018 for Medical Management of Chronic Issues  This patient comes in for periodic recheck on medications and conditions including hypertension, type 2 diabetes with insulin use, GERD, chronic atrial fibrillation.  He states that overall he is doing fairly well not having any difficulties.  He does need refills on several medications and will have labs performed..   All medications are reviewed today. There are no reports of any problems with the medications. All of the medical conditions are reviewed and updated.  Lab work is reviewed and will be ordered as medically necessary. There are no new problems reported with today's visit.   Past Medical History:  Diagnosis Date  . Diabetes mellitus without complication ([place])   . Hyperlipidemia   . Hypertension    Relevant past medical, surgical, family and social history reviewed and updated as indicated. Interim medical history since our last visit reviewed. Allergies and medications reviewed and updated. DATA REVIEWED: CHART IN EPIC  Family History reviewed for pertinent findings.  Review of Systems  Constitutional: Negative.  Negative for appetite change and fatigue.  HENT: Negative.   Eyes: Negative.  Negative for pain and visual disturbance.  Respiratory: Negative.  Negative for cough, chest tightness, shortness of breath and wheezing.   Cardiovascular: Negative.  Negative for chest pain, palpitations and leg swelling.  Gastrointestinal: Negative.  Negative for abdominal pain, diarrhea, nausea and vomiting.  Endocrine: Negative.   Genitourinary: Negative.   Musculoskeletal: Positive for arthralgias and myalgias.  Skin: Negative.  Negative for color change and rash.   Neurological: Negative.  Negative for weakness, numbness and headaches.  Psychiatric/Behavioral: Negative.     Allergies as of 05/03/2018      Reactions   Asa [aspirin] Hives   Penicillins Hives      Medication List        Accurate as of 05/03/18 11:59 PM. Always use your most recent med list.          alendronate 70 MG tablet Commonly known as:  FOSAMAX Take 1 tablet (70 mg total) by mouth once a week. Take with a full glass of water on an empty stomach.   ALPRAZolam 0.5 MG tablet Commonly known as:  XANAX Take 1 tablet (0.5 mg total) by mouth at bedtime as needed for anxiety.   atorvastatin 40 MG tablet Commonly known as:  LIPITOR Take 1 tablet (40 mg total) by mouth daily.   benazepril 5 MG tablet Commonly known as:  LOTENSIN Take 1 tablet (5 mg total) by mouth daily.   GLOBAL INJECT EASE INSULIN SYR 31G X 5/16" 0.3 ML Misc Generic drug:  Insulin Syringe-Needle U-100 USE TO INJECT THREE TIMES DAILY   insulin regular 100 units/mL injection Commonly known as:  NOVOLIN R,HUMULIN R INJECT 10 UNITS THREE TIMES DAILY WITH MEALS   metFORMIN 500 MG tablet Commonly known as:  GLUCOPHAGE Take 1 tablet (500 mg total) by mouth 2 (two) times daily.   metoprolol succinate 50 MG 24 hr tablet Commonly known as:  TOPROL-XL Take 1 tablet (50 mg total) by mouth daily.   oxyCODONE-acetaminophen 7.5-325 MG tablet Commonly  known as:  PERCOCET Take 1 tablet by mouth every 4 (four) hours as needed for severe pain.   oxyCODONE-acetaminophen 7.5-325 MG tablet Commonly known as:  PERCOCET Take 1 tablet by mouth every 4 (four) hours as needed for severe pain.   oxyCODONE-acetaminophen 7.5-325 MG tablet Commonly known as:  PERCOCET Take 1 tablet by mouth every 4 (four) hours as needed for severe pain.   ranitidine 300 MG tablet Commonly known as:  ZANTAC Take 1 tablet (300 mg total) by mouth daily.   tamsulosin 0.4 MG Caps capsule Commonly known as:  FLOMAX Take 1 capsule  (0.4 mg total) by mouth daily.   XARELTO 20 MG Tabs tablet Generic drug:  rivaroxaban Take 1 tablet (20 mg total) by mouth daily.          Objective:    BP 113/69   Pulse 84   Temp 97.6 F (36.4 C) (Oral)   Ht 6' (1.829 m)   Wt 240 lb (108.9 kg)   BMI 32.55 kg/m   Allergies  Allergen Reactions  . Asa [Aspirin] Hives  . Penicillins Hives    Wt Readings from Last 3 Encounters:  05/03/18 240 lb (108.9 kg)  01/31/18 236 lb 12.8 oz (107.4 kg)  11/28/17 220 lb (99.8 kg)    Physical Exam  Constitutional: He appears well-developed and well-nourished.  HENT:  Head: Normocephalic and atraumatic.  Eyes: Pupils are equal, round, and reactive to light. Conjunctivae and EOM are normal.  Neck: Normal range of motion. Neck supple.  Cardiovascular: Normal rate, regular rhythm and normal heart sounds.  Pulmonary/Chest: Effort normal and breath sounds normal.  Abdominal: Soft. Bowel sounds are normal.  Musculoskeletal: Normal range of motion.  Skin: Skin is warm and dry.    Results for orders placed or performed in visit on 05/03/18  CBC with Differential/Platelet  Result Value Ref Range   WBC 7.6 3.4 - 10.8 x10E3/uL   RBC 4.46 4.14 - 5.80 x10E6/uL   Hemoglobin 13.6 13.0 - 17.7 g/dL   Hematocrit 40.9 37.5 - 51.0 %   MCV 92 79 - 97 fL   MCH 30.5 26.6 - 33.0 pg   MCHC 33.3 31.5 - 35.7 g/dL   RDW 14.3 12.3 - 15.4 %   Platelets 174 150 - 450 x10E3/uL   Neutrophils 50 Not Estab. %   Lymphs 40 Not Estab. %   Monocytes 8 Not Estab. %   Eos 2 Not Estab. %   Basos 0 Not Estab. %   Neutrophils Absolute 3.8 1.4 - 7.0 x10E3/uL   Lymphocytes Absolute 3.0 0.7 - 3.1 x10E3/uL   Monocytes Absolute 0.6 0.1 - 0.9 x10E3/uL   EOS (ABSOLUTE) 0.1 0.0 - 0.4 x10E3/uL   Basophils Absolute 0.0 0.0 - 0.2 x10E3/uL   Immature Granulocytes 0 Not Estab. %   Immature Grans (Abs) 0.0 0.0 - 0.1 x10E3/uL  CMP14+EGFR  Result Value Ref Range   Glucose 291 (H) 65 - 99 mg/dL   BUN 24 8 - 27 mg/dL    Creatinine, Ser 1.30 (H) 0.76 - 1.27 mg/dL   GFR calc non Af Amer 55 (L) >59 mL/min/1.73   GFR calc Af Amer 63 >59 mL/min/1.73   BUN/Creatinine Ratio 18 10 - 24   Sodium 134 134 - 144 mmol/L   Potassium 4.7 3.5 - 5.2 mmol/L   Chloride 96 96 - 106 mmol/L   CO2 25 20 - 29 mmol/L   Calcium 9.7 8.6 - 10.2 mg/dL   Total Protein 6.5 6.0 -  8.5 g/dL   Albumin 4.2 3.5 - 4.8 g/dL   Globulin, Total 2.3 1.5 - 4.5 g/dL   Albumin/Globulin Ratio 1.8 1.2 - 2.2   Bilirubin Total 0.6 0.0 - 1.2 mg/dL   Alkaline Phosphatase 80 39 - 117 IU/L   AST 18 0 - 40 IU/L   ALT 18 0 - 44 IU/L  Bayer DCA Hb A1c Waived  Result Value Ref Range   HB A1C (BAYER DCA - WAIVED) 8.0 (H) <7.0 %  Lipid panel  Result Value Ref Range   Cholesterol, Total 226 (H) 100 - 199 mg/dL   Triglycerides 363 (H) 0 - 149 mg/dL   HDL 39 (L) >39 mg/dL   VLDL Cholesterol Cal 73 (H) 5 - 40 mg/dL   LDL Calculated 114 (H) 0 - 99 mg/dL   Chol/HDL Ratio 5.8 (H) 0.0 - 5.0 ratio  Microalbumin / creatinine urine ratio  Result Value Ref Range   Creatinine, Urine 118.2 Not Estab. mg/dL   Microalbumin, Urine 3.7 Not Estab. ug/mL   Microalb/Creat Ratio 3.1 0.0 - 30.0 mg/g creat      Assessment & Plan:   1. Essential hypertension - metoprolol succinate (TOPROL-XL) 50 MG 24 hr tablet; Take 1 tablet (50 mg total) by mouth daily.  Dispense: 30 tablet; Refill: 5 - benazepril (LOTENSIN) 5 MG tablet; Take 1 tablet (5 mg total) by mouth daily.  Dispense: 30 tablet; Refill: 5 - CBC with Differential/Platelet - CMP14+EGFR - Bayer DCA Hb A1c Waived - Lipid panel  2. Type 2 diabetes mellitus with diabetic neuropathy, with long-term current use of insulin (HCC) - metFORMIN (GLUCOPHAGE) 500 MG tablet; Take 1 tablet (500 mg total) by mouth 2 (two) times daily.  Dispense: 60 tablet; Refill: 5 - insulin regular (HUMULIN R) 100 units/mL injection; INJECT 10 UNITS THREE TIMES DAILY WITH MEALS  Dispense: 10 mL; Refill: 0 - CBC with Differential/Platelet -  CMP14+EGFR - Bayer DCA Hb A1c Waived - Lipid panel - Microalbumin / creatinine urine ratio  3. Gastroesophageal reflux disease without esophagitis - ranitidine (ZANTAC) 300 MG tablet; Take 1 tablet (300 mg total) by mouth daily.  Dispense: 30 tablet; Refill: 5  4. Chronic atrial fibrillation (HCC) - XARELTO 20 MG TABS tablet; Take 1 tablet (20 mg total) by mouth daily.  Dispense: 30 tablet; Refill: 5   Continue all other maintenance medications as listed above.  Follow up plan: Return in about 2 weeks (around 05/17/2018) for foot exam, 3 months for med refill.  Educational handout given for [place] PA-C [place] [address]  [place], [place] [address] [phone]   05/08/2018, 9:57 PM

## 2018-05-17 ENCOUNTER — Ambulatory Visit (INDEPENDENT_AMBULATORY_CARE_PROVIDER_SITE_OTHER): Payer: Medicare Other | Admitting: Physician Assistant

## 2018-05-17 ENCOUNTER — Encounter: Payer: Self-pay | Admitting: Physician Assistant

## 2018-05-17 VITALS — BP 104/58 | HR 78 | Temp 96.8°F | Ht 72.0 in | Wt 237.6 lb

## 2018-05-17 DIAGNOSIS — E1140 Type 2 diabetes mellitus with diabetic neuropathy, unspecified: Secondary | ICD-10-CM

## 2018-05-17 DIAGNOSIS — L84 Corns and callosities: Secondary | ICD-10-CM

## 2018-05-17 DIAGNOSIS — Z794 Long term (current) use of insulin: Secondary | ICD-10-CM

## 2018-05-17 DIAGNOSIS — L608 Other nail disorders: Secondary | ICD-10-CM

## 2018-05-17 DIAGNOSIS — M21962 Unspecified acquired deformity of left lower leg: Secondary | ICD-10-CM | POA: Diagnosis not present

## 2018-05-22 NOTE — Progress Notes (Addendum)
BP (!) 104/58   Pulse 78   Temp (!) 96.8 F (36 C) (Oral)   Ht 6' (1.829 m)   Wt 237 lb 9.6 oz (107.8 kg)   BMI 32.22 kg/m    Subjective:    Patient ID: Todd Zhang, male    DOB: 1945-12-08, 72 y.o.   MRN: [id number]  HPI: Todd Zhang is a 72 y.o. male presenting on 05/17/2018 for Diabetes (2 week rck )  Patient being seen for multiple chronic problems including his diabetes.  He is continued with occasional nausea and vomiting.  He states he is trying to get down some equate protein shakes on a regular basis.  They are low in carbs and calories.  But high in protein.  He is also here for a face-to-face evaluation for diabetic shoes.  Has positive history for this is type 2 diabetes uncontrolled and severe neuropathy of both feet.  He has had injuries to his feet also.  He is missing his third toe on his left foot.  He states that he daily has pain in his feet when he walks.  It hurts on the bottom and in the forefoot on both sides.  We will do physical exam of his foot.  Past Medical History:  Diagnosis Date  . Diabetes mellitus without complication ([place])   . Hyperlipidemia   . Hypertension    Relevant past medical, surgical, family and social history reviewed and updated as indicated. Interim medical history since our last visit reviewed. Allergies and medications reviewed and updated. DATA REVIEWED: CHART IN EPIC  Family History reviewed for pertinent findings.  Review of Systems  Constitutional: Negative.  Negative for appetite change and fatigue.  Eyes: Negative for pain and visual disturbance.  Respiratory: Negative.  Negative for cough, chest tightness, shortness of breath and wheezing.   Cardiovascular: Negative.  Negative for chest pain, palpitations and leg swelling.  Gastrointestinal: Negative.  Negative for abdominal pain, diarrhea, nausea and vomiting.  Genitourinary: Negative.   Musculoskeletal: Positive for arthralgias, back pain, gait problem, joint  swelling and myalgias.  Skin: Negative.  Negative for color change and rash.  Neurological: Negative for weakness, numbness and headaches.  Psychiatric/Behavioral: Negative.     Allergies as of 05/17/2018      Reactions   Asa [aspirin] Hives   Penicillins Hives      Medication List        Accurate as of 05/17/18 11:59 PM. Always use your most recent med list.          alendronate 70 MG tablet Commonly known as:  FOSAMAX Take 1 tablet (70 mg total) by mouth once a week. Take with a full glass of water on an empty stomach.   ALPRAZolam 0.5 MG tablet Commonly known as:  XANAX Take 1 tablet (0.5 mg total) by mouth at bedtime as needed for anxiety.   atorvastatin 40 MG tablet Commonly known as:  LIPITOR Take 1 tablet (40 mg total) by mouth daily.   benazepril 5 MG tablet Commonly known as:  LOTENSIN Take 1 tablet (5 mg total) by mouth daily.   GLOBAL INJECT EASE INSULIN SYR 31G X 5/16" 0.3 ML Misc Generic drug:  Insulin Syringe-Needle U-100 USE TO INJECT THREE TIMES DAILY   insulin regular 100 units/mL injection Commonly known as:  NOVOLIN R,HUMULIN R INJECT 10 UNITS THREE TIMES DAILY WITH MEALS   metFORMIN 500 MG tablet Commonly known as:  GLUCOPHAGE Take 1 tablet (500 mg total)  by mouth 2 (two) times daily.   metoprolol succinate 50 MG 24 hr tablet Commonly known as:  TOPROL-XL Take 1 tablet (50 mg total) by mouth daily.   oxyCODONE-acetaminophen 7.5-325 MG tablet Commonly known as:  PERCOCET Take 1 tablet by mouth every 4 (four) hours as needed for severe pain.   oxyCODONE-acetaminophen 7.5-325 MG tablet Commonly known as:  PERCOCET Take 1 tablet by mouth every 4 (four) hours as needed for severe pain.   oxyCODONE-acetaminophen 7.5-325 MG tablet Commonly known as:  PERCOCET Take 1 tablet by mouth every 4 (four) hours as needed for severe pain.   ranitidine 300 MG tablet Commonly known as:  ZANTAC Take 1 tablet (300 mg total) by mouth daily.   tamsulosin  0.4 MG Caps capsule Commonly known as:  FLOMAX Take 1 capsule (0.4 mg total) by mouth daily.   XARELTO 20 MG Tabs tablet Generic drug:  rivaroxaban Take 1 tablet (20 mg total) by mouth daily.          Objective:    BP (!) 104/58   Pulse 78   Temp (!) 96.8 F (36 C) (Oral)   Ht 6' (1.829 m)   Wt 237 lb 9.6 oz (107.8 kg)   BMI 32.22 kg/m   Allergies  Allergen Reactions  . Asa [Aspirin] Hives  . Penicillins Hives    Wt Readings from Last 3 Encounters:  05/17/18 237 lb 9.6 oz (107.8 kg)  05/03/18 240 lb (108.9 kg)  01/31/18 236 lb 12.8 oz (107.4 kg)    Physical Exam  Constitutional: He appears well-developed and well-nourished. No distress.  HENT:  Head: Normocephalic and atraumatic.  Eyes: Pupils are equal, round, and reactive to light. Conjunctivae and EOM are normal.  Cardiovascular: Normal rate, regular rhythm and normal heart sounds.  Pulses:      Dorsalis pedis pulses are 1+ on the right side, and 1+ on the left side.       Posterior tibial pulses are 1+ on the right side, and 1+ on the left side.  Pulmonary/Chest: Effort normal and breath sounds normal. No respiratory distress.  Musculoskeletal:       Right foot: There is decreased range of motion and deformity.       Left foot: There is decreased range of motion and deformity.  Feet:  Right Foot:  Protective Sensation: 8 sites tested. 0 sites sensed.  Skin Integrity: Positive for erythema, callus and dry skin.  Left Foot:  Protective Sensation: 8 sites tested. 0 sites sensed.  Skin Integrity: Positive for erythema, callus and dry skin.  Skin: Skin is warm and dry.  Psychiatric: He has a normal mood and affect. His behavior is normal.  Nursing note and vitals reviewed. Left third toe missing, Calluses on ends of toes with thickened deformed nails. Bunion on first metatarsal. Callus on heel Right foot with bunion at first metatarsal, small callus on heel. First and second toes with thickened nails and  deformity   Results for orders placed or performed in visit on 05/03/18  CBC with Differential/Platelet  Result Value Ref Range   WBC 7.6 3.4 - 10.8 x10E3/uL   RBC 4.46 4.14 - 5.80 x10E6/uL   Hemoglobin 13.6 13.0 - 17.7 g/dL   Hematocrit 40.9 37.5 - 51.0 %   MCV 92 79 - 97 fL   MCH 30.5 26.6 - 33.0 pg   MCHC 33.3 31.5 - 35.7 g/dL   RDW 14.3 12.3 - 15.4 %   Platelets 174 150 - 450  x10E3/uL   Neutrophils 50 Not Estab. %   Lymphs 40 Not Estab. %   Monocytes 8 Not Estab. %   Eos 2 Not Estab. %   Basos 0 Not Estab. %   Neutrophils Absolute 3.8 1.4 - 7.0 x10E3/uL   Lymphocytes Absolute 3.0 0.7 - 3.1 x10E3/uL   Monocytes Absolute 0.6 0.1 - 0.9 x10E3/uL   EOS (ABSOLUTE) 0.1 0.0 - 0.4 x10E3/uL   Basophils Absolute 0.0 0.0 - 0.2 x10E3/uL   Immature Granulocytes 0 Not Estab. %   Immature Grans (Abs) 0.0 0.0 - 0.1 x10E3/uL  CMP14+EGFR  Result Value Ref Range   Glucose 291 (H) 65 - 99 mg/dL   BUN 24 8 - 27 mg/dL   Creatinine, Ser 1.30 (H) 0.76 - 1.27 mg/dL   GFR calc non Af Amer 55 (L) >59 mL/min/1.73   GFR calc Af Amer 63 >59 mL/min/1.73   BUN/Creatinine Ratio 18 10 - 24   Sodium 134 134 - 144 mmol/L   Potassium 4.7 3.5 - 5.2 mmol/L   Chloride 96 96 - 106 mmol/L   CO2 25 20 - 29 mmol/L   Calcium 9.7 8.6 - 10.2 mg/dL   Total Protein 6.5 6.0 - 8.5 g/dL   Albumin 4.2 3.5 - 4.8 g/dL   Globulin, Total 2.3 1.5 - 4.5 g/dL   Albumin/Globulin Ratio 1.8 1.2 - 2.2   Bilirubin Total 0.6 0.0 - 1.2 mg/dL   Alkaline Phosphatase 80 39 - 117 IU/L   AST 18 0 - 40 IU/L   ALT 18 0 - 44 IU/L  Bayer DCA Hb A1c Waived  Result Value Ref Range   HB A1C (BAYER DCA - WAIVED) 8.0 (H) <7.0 %  Lipid panel  Result Value Ref Range   Cholesterol, Total 226 (H) 100 - 199 mg/dL   Triglycerides 363 (H) 0 - 149 mg/dL   HDL 39 (L) >39 mg/dL   VLDL Cholesterol Cal 73 (H) 5 - 40 mg/dL   LDL Calculated 114 (H) 0 - 99 mg/dL   Chol/HDL Ratio 5.8 (H) 0.0 - 5.0 ratio  Microalbumin / creatinine urine ratio  Result  Value Ref Range   Creatinine, Urine 118.2 Not Estab. mg/dL   Microalbumin, Urine 3.7 Not Estab. ug/mL   Microalb/Creat Ratio 3.1 0.0 - 30.0 mg/g creat      Assessment & Plan:   1. Type 2 diabetes mellitus with diabetic neuropathy, with long-term current use of insulin (HCC) Continue medications  2. Foot deformity, acquired, left DIABETIC shoe order  3. Callus of foot Diabetic shoe order Plan podiatry evaluation  4. Toenail deformity Diabetic shoe order   Continue all other maintenance medications as listed above.  Follow up plan: No follow-ups on file.  Educational handout given for [place] PA-C [facility] [address]  [place], [place] [address] [phone]   05/22/2018, 1:11 PM

## 2018-07-12 ENCOUNTER — Other Ambulatory Visit: Payer: Self-pay | Admitting: Physician Assistant

## 2018-07-12 DIAGNOSIS — E1140 Type 2 diabetes mellitus with diabetic neuropathy, unspecified: Secondary | ICD-10-CM

## 2018-07-12 DIAGNOSIS — Z794 Long term (current) use of insulin: Principal | ICD-10-CM

## 2018-08-03 ENCOUNTER — Telehealth: Payer: Self-pay | Admitting: Physician Assistant

## 2018-08-03 NOTE — Telephone Encounter (Signed)
Questions answered.

## 2018-08-04 ENCOUNTER — Ambulatory Visit (INDEPENDENT_AMBULATORY_CARE_PROVIDER_SITE_OTHER): Payer: Medicare Other | Admitting: Physician Assistant

## 2018-08-04 ENCOUNTER — Encounter: Payer: Self-pay | Admitting: Physician Assistant

## 2018-08-04 VITALS — BP 129/71 | HR 78 | Temp 98.4°F | Ht 72.0 in | Wt 234.6 lb

## 2018-08-04 DIAGNOSIS — M545 Low back pain, unspecified: Secondary | ICD-10-CM

## 2018-08-04 DIAGNOSIS — F411 Generalized anxiety disorder: Secondary | ICD-10-CM

## 2018-08-04 DIAGNOSIS — F4321 Adjustment disorder with depressed mood: Secondary | ICD-10-CM | POA: Diagnosis not present

## 2018-08-04 DIAGNOSIS — E1140 Type 2 diabetes mellitus with diabetic neuropathy, unspecified: Secondary | ICD-10-CM | POA: Diagnosis not present

## 2018-08-04 DIAGNOSIS — Z794 Long term (current) use of insulin: Secondary | ICD-10-CM

## 2018-08-04 DIAGNOSIS — I10 Essential (primary) hypertension: Secondary | ICD-10-CM | POA: Diagnosis not present

## 2018-08-04 DIAGNOSIS — G8929 Other chronic pain: Secondary | ICD-10-CM

## 2018-08-04 LAB — CMP14+EGFR
ALT: 13 IU/L (ref 0–44)
AST: 17 IU/L (ref 0–40)
Albumin/Globulin Ratio: 2 (ref 1.2–2.2)
Albumin: 4.2 g/dL (ref 3.5–4.8)
Alkaline Phosphatase: 74 IU/L (ref 39–117)
BUN/Creatinine Ratio: 19 (ref 10–24)
BUN: 20 mg/dL (ref 8–27)
Bilirubin Total: 0.6 mg/dL (ref 0.0–1.2)
CO2: 23 mmol/L (ref 20–29)
Calcium: 9.4 mg/dL (ref 8.6–10.2)
Chloride: 99 mmol/L (ref 96–106)
Creatinine, Ser: 1.06 mg/dL (ref 0.76–1.27)
GFR calc Af Amer: 80 mL/min/{1.73_m2} (ref >59)
GFR calc non Af Amer: 69 mL/min/{1.73_m2} (ref >59)
Globulin, Total: 2.1 g/dL (ref 1.5–4.5)
Glucose: 162 mg/dL — ABNORMAL HIGH (ref 65–99)
Potassium: 4.6 mmol/L (ref 3.5–5.2)
Sodium: 137 mmol/L (ref 134–144)
Total Protein: 6.3 g/dL (ref 6.0–8.5)

## 2018-08-04 LAB — LIPID PANEL
Chol/HDL Ratio: 4.7 ratio (ref 0.0–5.0)
Cholesterol, Total: 211 mg/dL — ABNORMAL HIGH (ref 100–199)
HDL: 45 mg/dL (ref >39)
LDL Calculated: 115 mg/dL — ABNORMAL HIGH (ref 0–99)
Triglycerides: 254 mg/dL — ABNORMAL HIGH (ref 0–149)
VLDL Cholesterol Cal: 51 mg/dL — ABNORMAL HIGH (ref 5–40)

## 2018-08-04 LAB — CBC WITH DIFFERENTIAL/PLATELET
Basophils Absolute: 0 10*3/uL (ref 0.0–0.2)
Basos: 1 %
EOS (ABSOLUTE): 0.1 10*3/uL (ref 0.0–0.4)
Eos: 2 %
Hematocrit: 37.3 % — ABNORMAL LOW (ref 37.5–51.0)
Hemoglobin: 13.3 g/dL (ref 13.0–17.7)
Immature Grans (Abs): 0 10*3/uL (ref 0.0–0.1)
Immature Granulocytes: 0 %
Lymphocytes Absolute: 2.9 10*3/uL (ref 0.7–3.1)
Lymphs: 41 %
MCH: 31.8 pg (ref 26.6–33.0)
MCHC: 35.7 g/dL (ref 31.5–35.7)
MCV: 89 fL (ref 79–97)
Monocytes Absolute: 0.6 10*3/uL (ref 0.1–0.9)
Monocytes: 8 %
Neutrophils Absolute: 3.5 10*3/uL (ref 1.4–7.0)
Neutrophils: 48 %
Platelets: 186 10*3/uL (ref 150–450)
RBC: 4.18 x10E6/uL (ref 4.14–5.80)
RDW: 13.1 % (ref 12.3–15.4)
WBC: 7.1 10*3/uL (ref 3.4–10.8)

## 2018-08-04 LAB — BAYER DCA HB A1C WAIVED: HB A1C (BAYER DCA - WAIVED): 7.8 % — ABNORMAL HIGH (ref <7.0)

## 2018-08-04 MED ORDER — OXYCODONE-ACETAMINOPHEN 7.5-325 MG PO TABS: 1.0000 | ORAL_TABLET | ORAL | 0 refills | Status: DC | PRN

## 2018-08-04 MED ORDER — ALPRAZOLAM 0.5 MG PO TABS: 0.5000 mg | ORAL_TABLET | Freq: Every evening | ORAL | 5 refills | Status: DC | PRN

## 2018-08-04 NOTE — Patient Instructions (Signed)
Coping With Loss, Adult People experience loss in many different ways throughout their lives. Events such as moving, changing jobs, and losing friends can create a sense of loss. The loss may be as serious as a major health change, divorce, death of a pet, or death of a loved one. All of these types of loss are likely to create a physical and emotional reaction known as grief. Grief is the result of a major change or an absence of something or someone that you count on. Grief is a normal reaction to loss. How to recognize changes A variety of factors can affect your grieving experience, including:  The nature of your loss.  Your relationship to what or whom you lost.  Your understanding of grief and how to cope with it.  Your support system.  The way that you deal with your grief will affect your ability to function as you normally do. When you are grieving, you may experience:  Numbness, shock, sadness, anxiety, anger, denial, and guilt.  Thoughts about death.  Unexpected crying.  A physical sensation of emptiness in your gut.  Problems sleeping and eating.  Fatigue.  Loss of interest in normal activities.  Dreaming about or imagining seeing the person who died.  A need to remember what or whom you lost.  Difficulty thinking about anything other than your loss for a period of time.  Relief. If you have been expecting the loss for a while, you may feel a sense of relief when it happens.  Where to find support To get support for coping with loss:  Ask your health care provider for help and recommendations, such as grief counseling or therapy.  Think about joining a support group for people who are coping with loss.  Follow these instructions at home:  Be patient with yourself and others. Allow the grieving process to happen, and remember that grieving takes time. ? It is likely that you may never feel completely done with some grief. You may find a way to move on while  still cherishing memories and feelings about your loss. ? Accepting your loss is a process. It can take months or longer to adjust.  Express your feelings in healthy ways, such as: ? Talking with others about your loss. It may be helpful to find others who have had a similar loss, such as a support group. ? Writing down your feelings in a journal. ? Doing physical activities to release stress and emotional energy. ? Doing creative activities like painting, sculpting, or playing or listening to music. ? Practicing resilience. This is the ability to recover and adjust after facing challenges. Reading some resources that encourage resilience may help you to learn ways to practice those behaviors.  Keep to your normal routine as much as possible. If you have trouble focusing or doing normal activities, it is acceptable to take some time away from your normal routine.  Spend time with friends and loved ones.  Eat a healthy diet, get plenty of sleep, and rest when you feel tired. Where to find more information: You can find more information about coping with loss from:  American Society of Clinical Oncology: www.cancer.net  American Psychological Association: www.apa.org  Contact a health care provider if:  Your grief is extreme and keeps getting worse.  You have ongoing grief that does not improve.  Your body shows symptoms of grief, such as illness.  You feel depressed, anxious, or lonely. Get help right away if:  You have   thoughts about hurting yourself or others. If you ever feel like you may hurt yourself or others, or have thoughts about taking your own life, get help right away. You can go to your nearest emergency department or call:  Your local emergency services (911 in the [place]  A suicide crisis helpline, such as the National Suicide Prevention Lifeline at 1-800-273-8255. This is open 24 hours a day.  Summary  Grief is a normal part of experiencing a loss. It is the  result of a major change or an absence of something or someone that you count on.  The depth of grief and the period of recovery depend on the type of loss as well as your ability to adjust to the change and process your feelings.  Processing grief requires patience and a willingness to accept your feelings and talk about your loss with people who are supportive.  It is important to find resources that work for you and to realize that we are all different when it comes to grief. There is not one single grieving process that works for everyone in the same way.  Be aware that when grief becomes extreme, it can lead to more severe issues like isolation, depression, anxiety, or suicidal thoughts. Talk with your health care provider if you have any of these issues. This information is not intended to replace advice given to you by your health care provider. Make sure you discuss any questions you have with your health care provider. Document Released: 01/13/2017 Document Revised: 01/13/2017 Document Reviewed: 01/13/2017 Elsevier Interactive Patient Education  2018 Elsevier Inc.  

## 2018-08-04 NOTE — Progress Notes (Signed)
BP 129/71   Pulse 78   Temp 98.4 F (36.9 C) (Oral)   Ht 6' (1.829 m)   Wt 234 lb 9.6 oz (106.4 kg)   BMI 31.82 kg/m    Subjective:    Patient ID: Todd Zhang, male    DOB: 1945-12-17, 72 y.o.   MRN: [id number]  HPI: Todd Zhang is a 72 y.o. male presenting on 08/04/2018 for Diabetes (3 month follow up ) and Hypertension  This patient comes in for periodic recheck on medications and conditions including diabetes, hypertension, GAD, chronic back pain.  He is under grief at this time from his granddaughter dying recently.  She had a lot of long term medical problems..   All medications are reviewed today. There are no reports of any problems with the medications. All of the medical conditions are reviewed and updated.  Lab work is reviewed and will be ordered as medically necessary. There are no new problems reported with today's visit.   Past Medical History:  Diagnosis Date  . Diabetes mellitus without complication ([place])   . Hyperlipidemia   . Hypertension    Relevant past medical, surgical, family and social history reviewed and updated as indicated. Interim medical history since our last visit reviewed. Allergies and medications reviewed and updated. DATA REVIEWED: CHART IN EPIC  Family History reviewed for pertinent findings.  Review of Systems  Constitutional: Negative.  Negative for appetite change and fatigue.  Eyes: Negative for pain and visual disturbance.  Respiratory: Negative.  Negative for cough, chest tightness, shortness of breath and wheezing.   Cardiovascular: Negative.  Negative for chest pain, palpitations and leg swelling.  Gastrointestinal: Negative.  Negative for abdominal pain, diarrhea, nausea and vomiting.  Genitourinary: Negative.   Skin: Negative.  Negative for color change and rash.  Neurological: Negative.  Negative for weakness, numbness and headaches.  Psychiatric/Behavioral: Negative.     Allergies as of 08/04/2018      Reactions   Asa [aspirin] Hives   Penicillins Hives      Medication List        Accurate as of 08/04/18 11:59 PM. Always use your most recent med list.          alendronate 70 MG tablet Commonly known as:  FOSAMAX Take 1 tablet (70 mg total) by mouth once a week. Take with a full glass of water on an empty stomach.   ALPRAZolam 0.5 MG tablet Commonly known as:  XANAX Take 1 tablet (0.5 mg total) by mouth at bedtime as needed for anxiety.   atorvastatin 40 MG tablet Commonly known as:  LIPITOR Take 1 tablet (40 mg total) by mouth daily.   benazepril 5 MG tablet Commonly known as:  LOTENSIN Take 1 tablet (5 mg total) by mouth daily.   GLOBAL INJECT EASE INSULIN SYR 31G X 5/16" 0.3 ML Misc Generic drug:  Insulin Syringe-Needle U-100 USE TO INJECT THREE TIMES DAILY   insulin regular 100 units/mL injection Commonly known as:  NOVOLIN R,HUMULIN R INJECT 10 UNITS THREE TIMES DAILY WITH MEALS   metFORMIN 500 MG tablet Commonly known as:  GLUCOPHAGE Take 1 tablet (500 mg total) by mouth 2 (two) times daily.   metoprolol succinate 50 MG 24 hr tablet Commonly known as:  TOPROL-XL Take 1 tablet (50 mg total) by mouth daily.   oxyCODONE-acetaminophen 7.5-325 MG tablet Commonly known as:  PERCOCET Take 1 tablet by mouth every 4 (four) hours as needed for severe pain.   oxyCODONE-acetaminophen 7.5-325  MG tablet Commonly known as:  PERCOCET Take 1 tablet by mouth every 4 (four) hours as needed for severe pain.   oxyCODONE-acetaminophen 7.5-325 MG tablet Commonly known as:  PERCOCET Take 1 tablet by mouth every 4 (four) hours as needed for severe pain.   ranitidine 300 MG tablet Commonly known as:  ZANTAC Take 1 tablet (300 mg total) by mouth daily.   tamsulosin 0.4 MG Caps capsule Commonly known as:  FLOMAX Take 1 capsule (0.4 mg total) by mouth daily.   XARELTO 20 MG Tabs tablet Generic drug:  rivaroxaban Take 1 tablet (20 mg total) by mouth daily.          Objective:      BP 129/71   Pulse 78   Temp 98.4 F (36.9 C) (Oral)   Ht 6' (1.829 m)   Wt 234 lb 9.6 oz (106.4 kg)   BMI 31.82 kg/m   Allergies  Allergen Reactions  . Asa [Aspirin] Hives  . Penicillins Hives    Wt Readings from Last 3 Encounters:  08/04/18 234 lb 9.6 oz (106.4 kg)  05/17/18 237 lb 9.6 oz (107.8 kg)  05/03/18 240 lb (108.9 kg)    Physical Exam  Constitutional: He appears well-developed and well-nourished. No distress.  HENT:  Head: Normocephalic and atraumatic.  Eyes: Pupils are equal, round, and reactive to light. Conjunctivae and EOM are normal.  Cardiovascular: Normal rate, regular rhythm and normal heart sounds.  Pulmonary/Chest: Effort normal and breath sounds normal. No respiratory distress.  Musculoskeletal:       Lumbar back: He exhibits decreased range of motion, tenderness, pain and spasm.  Skin: Skin is warm and dry.  Psychiatric: He has a normal mood and affect. His behavior is normal.  Nursing note and vitals reviewed.   Results for orders placed or performed in visit on 08/04/18  CBC with Differential/Platelet  Result Value Ref Range   WBC 7.1 3.4 - 10.8 x10E3/uL   RBC 4.18 4.14 - 5.80 x10E6/uL   Hemoglobin 13.3 13.0 - 17.7 g/dL   Hematocrit 37.3 (L) 37.5 - 51.0 %   MCV 89 79 - 97 fL   MCH 31.8 26.6 - 33.0 pg   MCHC 35.7 31.5 - 35.7 g/dL   RDW 13.1 12.3 - 15.4 %   Platelets 186 150 - 450 x10E3/uL   Neutrophils 48 Not Estab. %   Lymphs 41 Not Estab. %   Monocytes 8 Not Estab. %   Eos 2 Not Estab. %   Basos 1 Not Estab. %   Neutrophils Absolute 3.5 1.4 - 7.0 x10E3/uL   Lymphocytes Absolute 2.9 0.7 - 3.1 x10E3/uL   Monocytes Absolute 0.6 0.1 - 0.9 x10E3/uL   EOS (ABSOLUTE) 0.1 0.0 - 0.4 x10E3/uL   Basophils Absolute 0.0 0.0 - 0.2 x10E3/uL   Immature Granulocytes 0 Not Estab. %   Immature Grans (Abs) 0.0 0.0 - 0.1 x10E3/uL  CMP14+EGFR  Result Value Ref Range   Glucose 162 (H) 65 - 99 mg/dL   BUN 20 8 - 27 mg/dL   Creatinine, Ser 1.06 0.76 -  1.27 mg/dL   GFR calc non Af Amer 69 >59 mL/min/1.73   GFR calc Af Amer 80 >59 mL/min/1.73   BUN/Creatinine Ratio 19 10 - 24   Sodium 137 134 - 144 mmol/L   Potassium 4.6 3.5 - 5.2 mmol/L   Chloride 99 96 - 106 mmol/L   CO2 23 20 - 29 mmol/L   Calcium 9.4 8.6 - 10.2 mg/dL  Total Protein 6.3 6.0 - 8.5 g/dL   Albumin 4.2 3.5 - 4.8 g/dL   Globulin, Total 2.1 1.5 - 4.5 g/dL   Albumin/Globulin Ratio 2.0 1.2 - 2.2   Bilirubin Total 0.6 0.0 - 1.2 mg/dL   Alkaline Phosphatase 74 39 - 117 IU/L   AST 17 0 - 40 IU/L   ALT 13 0 - 44 IU/L  Lipid panel  Result Value Ref Range   Cholesterol, Total 211 (H) 100 - 199 mg/dL   Triglycerides 254 (H) 0 - 149 mg/dL   HDL 45 >39 mg/dL   VLDL Cholesterol Cal 51 (H) 5 - 40 mg/dL   LDL Calculated 115 (H) 0 - 99 mg/dL   Chol/HDL Ratio 4.7 0.0 - 5.0 ratio  Bayer DCA Hb A1c Waived  Result Value Ref Range   HB A1C (BAYER DCA - WAIVED) 7.8 (H) <7.0 %      Assessment & Plan:   1. Type 2 diabetes mellitus with diabetic neuropathy, with long-term current use of insulin (HCC) - CBC with Differential/Platelet - CMP14+EGFR - Lipid panel - Bayer DCA Hb A1c Waived  2. Essential hypertension - CBC with Differential/Platelet - CMP14+EGFR - Lipid panel - Bayer DCA Hb A1c Waived  3. Generalized anxiety disorder - ALPRAZolam (XANAX) 0.5 MG tablet; Take 1 tablet (0.5 mg total) by mouth at bedtime as needed for anxiety.  Dispense: 30 tablet; Refill: 5  4. Grief  5. Chronic low back pain without sciatica, unspecified back pain laterality - oxyCODONE-acetaminophen (PERCOCET) 7.5-325 MG tablet; Take 1 tablet by mouth every 4 (four) hours as needed for severe pain.  Dispense: 120 tablet; Refill: 0 - oxyCODONE-acetaminophen (PERCOCET) 7.5-325 MG tablet; Take 1 tablet by mouth every 4 (four) hours as needed for severe pain.  Dispense: 120 tablet; Refill: 0 - oxyCODONE-acetaminophen (PERCOCET) 7.5-325 MG tablet; Take 1 tablet by mouth every 4 (four) hours as needed  for severe pain.  Dispense: 120 tablet; Refill: 0   Continue all other maintenance medications as listed above.  Follow up plan: Return in about 3 months (around 11/04/2018) for recheck.  Educational handout given for [place] PA-C [facility] [address]  [place], [place] [address] [phone]   08/05/2018, 5:51 PM

## 2018-08-11 ENCOUNTER — Other Ambulatory Visit: Payer: Self-pay | Admitting: Physician Assistant

## 2018-09-06 ENCOUNTER — Other Ambulatory Visit: Payer: Self-pay | Admitting: Physician Assistant

## 2018-09-06 DIAGNOSIS — Z794 Long term (current) use of insulin: Principal | ICD-10-CM

## 2018-09-06 DIAGNOSIS — E1140 Type 2 diabetes mellitus with diabetic neuropathy, unspecified: Secondary | ICD-10-CM

## 2018-10-13 ENCOUNTER — Telehealth: Payer: Self-pay | Admitting: Physician Assistant

## 2018-10-13 MED ORDER — OMEPRAZOLE 20 MG PO CPDR: 20.0000 mg | DELAYED_RELEASE_CAPSULE | Freq: Every day | ORAL | 5 refills | Status: DC

## 2018-10-30 ENCOUNTER — Encounter: Payer: Medicare Other | Admitting: *Deleted

## 2018-11-04 ENCOUNTER — Other Ambulatory Visit: Payer: Self-pay | Admitting: Physician Assistant

## 2018-11-04 DIAGNOSIS — R3914 Feeling of incomplete bladder emptying: Principal | ICD-10-CM

## 2018-11-04 DIAGNOSIS — N401 Enlarged prostate with lower urinary tract symptoms: Secondary | ICD-10-CM

## 2018-11-06 ENCOUNTER — Ambulatory Visit (INDEPENDENT_AMBULATORY_CARE_PROVIDER_SITE_OTHER): Payer: Medicare Other | Admitting: Physician Assistant

## 2018-11-06 ENCOUNTER — Encounter: Payer: Self-pay | Admitting: Physician Assistant

## 2018-11-06 VITALS — BP 127/79 | HR 54 | Temp 96.5°F | Ht 72.0 in | Wt 239.0 lb

## 2018-11-06 DIAGNOSIS — M545 Low back pain, unspecified: Secondary | ICD-10-CM

## 2018-11-06 DIAGNOSIS — I10 Essential (primary) hypertension: Secondary | ICD-10-CM

## 2018-11-06 DIAGNOSIS — E1140 Type 2 diabetes mellitus with diabetic neuropathy, unspecified: Secondary | ICD-10-CM

## 2018-11-06 DIAGNOSIS — I482 Chronic atrial fibrillation, unspecified: Secondary | ICD-10-CM

## 2018-11-06 DIAGNOSIS — N401 Enlarged prostate with lower urinary tract symptoms: Secondary | ICD-10-CM | POA: Diagnosis not present

## 2018-11-06 DIAGNOSIS — Z794 Long term (current) use of insulin: Secondary | ICD-10-CM

## 2018-11-06 DIAGNOSIS — M5030 Other cervical disc degeneration, unspecified cervical region: Secondary | ICD-10-CM

## 2018-11-06 DIAGNOSIS — R3914 Feeling of incomplete bladder emptying: Secondary | ICD-10-CM

## 2018-11-06 DIAGNOSIS — M4802 Spinal stenosis, cervical region: Secondary | ICD-10-CM | POA: Insufficient documentation

## 2018-11-06 DIAGNOSIS — G8929 Other chronic pain: Secondary | ICD-10-CM

## 2018-11-06 LAB — BAYER DCA HB A1C WAIVED: HB A1C (BAYER DCA - WAIVED): 7.4 % — ABNORMAL HIGH (ref <7.0)

## 2018-11-06 MED ORDER — OXYCODONE-ACETAMINOPHEN 7.5-325 MG PO TABS: 1.0000 | ORAL_TABLET | ORAL | 0 refills | Status: DC | PRN

## 2018-11-06 MED ORDER — TAMSULOSIN HCL 0.4 MG PO CAPS: 0.4000 mg | ORAL_CAPSULE | Freq: Every day | ORAL | 11 refills | Status: DC

## 2018-11-06 MED ORDER — METFORMIN HCL 500 MG PO TABS: 500.0000 mg | ORAL_TABLET | Freq: Two times a day (BID) | ORAL | 5 refills | Status: DC

## 2018-11-06 MED ORDER — ATORVASTATIN CALCIUM 40 MG PO TABS: 40.0000 mg | ORAL_TABLET | Freq: Every day | ORAL | 5 refills | Status: DC

## 2018-11-06 MED ORDER — METOPROLOL SUCCINATE ER 50 MG PO TB24: 50.0000 mg | ORAL_TABLET | Freq: Every day | ORAL | 5 refills | Status: DC

## 2018-11-06 MED ORDER — XARELTO 20 MG PO TABS: 20.0000 mg | ORAL_TABLET | Freq: Every day | ORAL | 5 refills | Status: DC

## 2018-11-06 MED ORDER — ALENDRONATE SODIUM 70 MG PO TABS: 70.0000 mg | ORAL_TABLET | ORAL | 5 refills | Status: DC

## 2018-11-06 NOTE — Progress Notes (Signed)
BP 127/79   Pulse (!) 54   Temp (!) 96.5 F (35.8 C) (Oral)   Ht 6' (1.829 m)   Wt 239 lb (108.4 kg)   BMI 32.41 kg/m    Subjective:    Patient ID: Todd Zhang, male    DOB: Jul 01, 1946, 73 y.o.   MRN: [id number]  HPI: Todd Zhang is a 73 y.o. male presenting on 11/06/2018 for Diabetes; Hypertension; Anxiety; and Gastroesophageal Reflux  This patient comes in for periodic recheck on medications and conditions including chronic sciatica and leg pain. He has pain 5/10 or higher at times.  He also has DDD and stenosis in the cervical spine.  He does need medicaitons .   All medications are reviewed today. There are no reports of any problems with the medications. All of the medical conditions are reviewed and updated.  Lab work is reviewed and will be ordered as medically necessary. There are no new problems reported with today's visit. Other history includes BPH, chronic a fib, type 2 diabetes, hypertension.  This patient returns for a 3 month recheck on narcotic use for DDD cervical and lumbar and medication refills  Patient currently taking percocet 7.5 QID. Behavior- normal Medication side effects- no Any concerns- no LAST MRI 208 multilevel DDD and stenosis PMP AWARE website reviewed: Yes Any suspicious activity on PMP Aware: No MME daily dose: 67.50 MME Contract on file Last UDS Feb 2020  Past Medical History:  Diagnosis Date  . Diabetes mellitus without complication (HCC)   . Hyperlipidemia   . Hypertension    Relevant past medical, surgical, family and social history reviewed and updated as indicated. Interim medical history since our last visit reviewed. Allergies and medications reviewed and updated. DATA REVIEWED: CHART IN EPIC  Family History reviewed for pertinent findings.  Review of Systems  Constitutional: Negative.  Negative for appetite change and fatigue.  Eyes: Negative for pain and visual disturbance.  Respiratory: Negative.  Negative for cough,  chest tightness, shortness of breath and wheezing.   Cardiovascular: Negative.  Negative for chest pain, palpitations and leg swelling.  Gastrointestinal: Negative.  Negative for abdominal pain, diarrhea, nausea and vomiting.  Genitourinary: Negative.   Skin: Negative.  Negative for color change and rash.  Neurological: Negative.  Negative for weakness, numbness and headaches.  Psychiatric/Behavioral: Negative.     Allergies as of 11/06/2018      Reactions   Asa [aspirin] Hives   Penicillins Hives      Medication List       Accurate as of November 06, 2018  9:13 AM. Always use your most recent med list.        ACCU-CHEK AVIVA PLUS test strip Generic drug:  glucose blood TO check blood glucose twice daily   alendronate 70 MG tablet Commonly known as:  FOSAMAX Take 1 tablet (70 mg total) by mouth once a week. Take with a full glass of water on an empty stomach.   ALPRAZolam 0.5 MG tablet Commonly known as:  XANAX Take 1 tablet (0.5 mg total) by mouth at bedtime as needed for anxiety.   atorvastatin 40 MG tablet Commonly known as:  LIPITOR Take 1 tablet (40 mg total) by mouth daily.   benazepril 5 MG tablet Commonly known as:  LOTENSIN Take 1 tablet (5 mg total) by mouth daily.   GLOBAL INJECT EASE INSULIN SYR 31G X 5/16" 0.3 ML Misc Generic drug:  Insulin Syringe-Needle U-100 USE TO INJECT THREE TIMES DAILY  insulin regular 100 units/mL injection Commonly known as:  HUMULIN R Inject 0.1 mLs (10 Units total) into the skin 3 (three) times daily before meals.   metFORMIN 500 MG tablet Commonly known as:  GLUCOPHAGE Take 1 tablet (500 mg total) by mouth 2 (two) times daily.   metoprolol succinate 50 MG 24 hr tablet Commonly known as:  TOPROL-XL Take 1 tablet (50 mg total) by mouth daily.   multivitamin tablet Take 1 tablet by mouth daily.   omeprazole 20 MG capsule Commonly known as:  PRILOSEC Take 1 capsule (20 mg total) by mouth daily.     oxyCODONE-acetaminophen 7.5-325 MG tablet Commonly known as:  PERCOCET Take 1 tablet by mouth every 4 (four) hours as needed for severe pain.   oxyCODONE-acetaminophen 7.5-325 MG tablet Commonly known as:  PERCOCET Take 1 tablet by mouth every 4 (four) hours as needed for severe pain.   oxyCODONE-acetaminophen 7.5-325 MG tablet Commonly known as:  PERCOCET Take 1 tablet by mouth every 4 (four) hours as needed for severe pain.   pyridOXINE 100 MG tablet Commonly known as:  VITAMIN B-6 Take 100 mg by mouth daily.   tamsulosin 0.4 MG Caps capsule Commonly known as:  FLOMAX Take 1 capsule (0.4 mg total) by mouth daily.   XARELTO 20 MG Tabs tablet Generic drug:  rivaroxaban Take 1 tablet (20 mg total) by mouth daily.          Objective:    BP 127/79   Pulse (!) 54   Temp (!) 96.5 F (35.8 C) (Oral)   Ht 6' (1.829 m)   Wt 239 lb (108.4 kg)   BMI 32.41 kg/m   Allergies  Allergen Reactions  . Asa [Aspirin] Hives  . Penicillins Hives    Wt Readings from Last 3 Encounters:  11/06/18 239 lb (108.4 kg)  08/04/18 234 lb 9.6 oz (106.4 kg)  05/17/18 237 lb 9.6 oz (107.8 kg)    Physical Exam Vitals signs and nursing note reviewed.  Constitutional:      General: He is not in acute distress.    Appearance: He is well-developed.  HENT:     Head: Normocephalic and atraumatic.  Eyes:     Conjunctiva/sclera: Conjunctivae normal.     Pupils: Pupils are equal, round, and reactive to light.  Cardiovascular:     Rate and Rhythm: Normal rate and regular rhythm.     Heart sounds: Normal heart sounds.  Pulmonary:     Effort: Pulmonary effort is normal. No respiratory distress.     Breath sounds: Normal breath sounds.  Skin:    General: Skin is warm and dry.  Psychiatric:        Behavior: Behavior normal.         Assessment & Plan:   1. Chronic low back pain without sciatica, unspecified back pain laterality - ToxASSURE Select 13 (MW), Urine - oxyCODONE-acetaminophen  (PERCOCET) 7.5-325 MG tablet; Take 1 tablet by mouth every 4 (four) hours as needed for severe pain.  Dispense: 120 tablet; Refill: 0 - oxyCODONE-acetaminophen (PERCOCET) 7.5-325 MG tablet; Take 1 tablet by mouth every 4 (four) hours as needed for severe pain.  Dispense: 120 tablet; Refill: 0 - oxyCODONE-acetaminophen (PERCOCET) 7.5-325 MG tablet; Take 1 tablet by mouth every 4 (four) hours as needed for severe pain.  Dispense: 120 tablet; Refill: 0  2. Benign prostatic hyperplasia with incomplete bladder emptying - tamsulosin (FLOMAX) 0.4 MG CAPS capsule; Take 1 capsule (0.4 mg total) by mouth daily.  Dispense:  30 capsule; Refill: 11  3. Chronic atrial fibrillation - XARELTO 20 MG TABS tablet; Take 1 tablet (20 mg total) by mouth daily.  Dispense: 30 tablet; Refill: 5  4. Type 2 diabetes mellitus with diabetic neuropathy, with long-term current use of insulin (HCC) - metFORMIN (GLUCOPHAGE) 500 MG tablet; Take 1 tablet (500 mg total) by mouth 2 (two) times daily.  Dispense: 60 tablet; Refill: 5  5. Essential hypertension - metoprolol succinate (TOPROL-XL) 50 MG 24 hr tablet; Take 1 tablet (50 mg total) by mouth daily.  Dispense: 30 tablet; Refill: 5  6. DDD (degenerative disc disease), cervical - oxyCODONE-acetaminophen (PERCOCET) 7.5-325 MG tablet; Take 1 tablet by mouth every 4 (four) hours as needed for severe pain.  Dispense: 120 tablet; Refill: 0 - oxyCODONE-acetaminophen (PERCOCET) 7.5-325 MG tablet; Take 1 tablet by mouth every 4 (four) hours as needed for severe pain.  Dispense: 120 tablet; Refill: 0 - oxyCODONE-acetaminophen (PERCOCET) 7.5-325 MG tablet; Take 1 tablet by mouth every 4 (four) hours as needed for severe pain.  Dispense: 120 tablet; Refill: 0  7. Cervical spinal stenosis - oxyCODONE-acetaminophen (PERCOCET) 7.5-325 MG tablet; Take 1 tablet by mouth every 4 (four) hours as needed for severe pain.  Dispense: 120 tablet; Refill: 0 - oxyCODONE-acetaminophen (PERCOCET)  7.5-325 MG tablet; Take 1 tablet by mouth every 4 (four) hours as needed for severe pain.  Dispense: 120 tablet; Refill: 0 - oxyCODONE-acetaminophen (PERCOCET) 7.5-325 MG tablet; Take 1 tablet by mouth every 4 (four) hours as needed for severe pain.  Dispense: 120 tablet; Refill: 0   Continue all other maintenance medications as listed above.  Follow up plan: No follow-ups on file.  Educational handout given for survey  Remus Loffler PA-C [facility] [address]  [place], [place] [address] [phone]   11/06/2018, 9:13 AM

## 2018-11-07 LAB — LIPID PANEL
Chol/HDL Ratio: 5.6 ratio — ABNORMAL HIGH (ref 0.0–5.0)
Cholesterol, Total: 214 mg/dL — ABNORMAL HIGH (ref 100–199)
HDL: 38 mg/dL — ABNORMAL LOW (ref >39)
LDL Calculated: 109 mg/dL — ABNORMAL HIGH (ref 0–99)
Triglycerides: 337 mg/dL — ABNORMAL HIGH (ref 0–149)
VLDL Cholesterol Cal: 67 mg/dL — ABNORMAL HIGH (ref 5–40)

## 2018-11-09 LAB — TOXASSURE SELECT 13 (MW), URINE

## 2018-11-21 ENCOUNTER — Encounter: Payer: Medicare Other | Admitting: *Deleted

## 2018-11-22 ENCOUNTER — Encounter: Payer: Self-pay | Admitting: *Deleted

## 2018-11-22 ENCOUNTER — Ambulatory Visit (INDEPENDENT_AMBULATORY_CARE_PROVIDER_SITE_OTHER): Payer: Medicare Other | Admitting: *Deleted

## 2018-11-22 ENCOUNTER — Other Ambulatory Visit: Payer: Self-pay

## 2018-11-22 VITALS — BP 123/69 | HR 82 | Ht 72.0 in | Wt 232.0 lb

## 2018-11-22 DIAGNOSIS — Z0000 Encounter for general adult medical examination without abnormal findings: Secondary | ICD-10-CM

## 2018-11-22 DIAGNOSIS — Z1159 Encounter for screening for other viral diseases: Secondary | ICD-10-CM

## 2018-11-22 NOTE — Patient Instructions (Addendum)
Please work on your goal of reducing sugar intake.    Please review the information given on Advance Directives.  If you complete the paperwork, please bring a copy to our office to be filed in your medical record.   Please consider getting the Shingrix (Shingles) vaccine in the future.    Remember to go for your eye exam on 01/02/2019 at 10:15 am.   Please continue to move carefully to avoid falls.   Thank you for coming in for your Annual Wellness Visit today!   Preventive Care 37 Years and Older, Male Preventive care refers to lifestyle choices and visits with your health care provider that can promote health and wellness. What does preventive care include?   A yearly physical exam. This is also called an annual well check.  Dental exams once or twice a year.  Routine eye exams. Ask your health care provider how often you should have your eyes checked.  Personal lifestyle choices, including: ? Daily care of your teeth and gums. ? Regular physical activity. ? Eating a healthy diet. ? Avoiding tobacco and drug use. ? Limiting alcohol use. ? Practicing safe sex. ? Taking low doses of aspirin every day. ? Taking vitamin and mineral supplements as recommended by your health care provider. What happens during an annual well check? The services and screenings done by your health care provider during your annual well check will depend on your age, overall health, lifestyle risk factors, and family history of disease. Counseling Your health care provider may ask you questions about your:  Alcohol use.  Tobacco use.  Drug use.  Emotional well-being.  Home and relationship well-being.  Sexual activity.  Eating habits.  History of falls.  Memory and ability to understand (cognition).  Work and work Statistician. Screening You may have the following tests or measurements:  Height, weight, and BMI.  Blood pressure.  Lipid and cholesterol levels. These may be  checked every 5 years, or more frequently if you are over 66 years old.  Skin check.  Lung cancer screening. You may have this screening every year starting at age 37 if you have a 30-pack-year history of smoking and currently smoke or have quit within the past 15 years.  Colorectal cancer screening. All adults should have this screening starting at age 47 and continuing until age 41. You will have tests every 1-10 years, depending on your results and the type of screening test. People at increased risk should start screening at an earlier age. Screening tests may include: ? Guaiac-based fecal occult blood testing. ? Fecal immunochemical test (FIT). ? Stool DNA test. ? Virtual colonoscopy. ? Sigmoidoscopy. During this test, a flexible tube with a tiny camera (sigmoidoscope) is used to examine your rectum and lower colon. The sigmoidoscope is inserted through your anus into your rectum and lower colon. ? Colonoscopy. During this test, a long, thin, flexible tube with a tiny camera (colonoscope) is used to examine your entire colon and rectum.  Prostate cancer screening. Recommendations will vary depending on your family history and other risks.  Hepatitis C blood test.  Hepatitis B blood test.  Sexually transmitted disease (STD) testing.  Diabetes screening. This is done by checking your blood sugar (glucose) after you have not eaten for a while (fasting). You may have this done every 1-3 years.  Abdominal aortic aneurysm (AAA) screening. You may need this if you are a current or former smoker.  Osteoporosis. You may be screened starting at age 26 if  you are at high risk. Talk with your health care provider about your test results, treatment options, and if necessary, the need for more tests. Vaccines Your health care provider may recommend certain vaccines, such as:  Influenza vaccine. This is recommended every year.  Tetanus, diphtheria, and acellular pertussis (Tdap, Td) vaccine.  You may need a Td booster every 10 years.  Varicella vaccine. You may need this if you have not been vaccinated.  Zoster vaccine. You may need this after age 45.  Measles, mumps, and rubella (MMR) vaccine. You may need at least one dose of MMR if you were born in 1957 or later. You may also need a second dose.  Pneumococcal 13-valent conjugate (PCV13) vaccine. One dose is recommended after age 78.  Pneumococcal polysaccharide (PPSV23) vaccine. One dose is recommended after age 70.  Meningococcal vaccine. You may need this if you have certain conditions.  Hepatitis A vaccine. You may need this if you have certain conditions or if you travel or work in places where you may be exposed to hepatitis A.  Hepatitis B vaccine. You may need this if you have certain conditions or if you travel or work in places where you may be exposed to hepatitis B.  Haemophilus influenzae type b (Hib) vaccine. You may need this if you have certain risk factors. Talk to your health care provider about which screenings and vaccines you need and how often you need them. This information is not intended to replace advice given to you by your health care provider. Make sure you discuss any questions you have with your health care provider. Document Released: 09/26/2015 Document Revised: 10/20/2017 Document Reviewed: 07/01/2015 Elsevier Interactive Patient Education  2019 [employer].    Diabetes Mellitus and Nutrition, Adult When you have diabetes (diabetes mellitus), it is very important to have healthy eating habits because your blood sugar (glucose) levels are greatly affected by what you eat and drink. Eating healthy foods in the appropriate amounts, at about the same times every day, can help you:  Control your blood glucose.  Lower your risk of heart disease.  Improve your blood pressure.  Reach or maintain a healthy weight. Every person with diabetes is different, and each person has different needs  for a meal plan. Your health care provider may recommend that you work with a diet and nutrition specialist (dietitian) to make a meal plan that is best for you. Your meal plan may vary depending on factors such as:  The calories you need.  The medicines you take.  Your weight.  Your blood glucose, blood pressure, and cholesterol levels.  Your activity level.  Other health conditions you have, such as heart or kidney disease. How do carbohydrates affect me? Carbohydrates, also called carbs, affect your blood glucose level more than any other type of food. Eating carbs naturally raises the amount of glucose in your blood. Carb counting is a method for keeping track of how many carbs you eat. Counting carbs is important to keep your blood glucose at a healthy level, especially if you use insulin or take certain oral diabetes medicines. It is important to know how many carbs you can safely have in each meal. This is different for every person. Your dietitian can help you calculate how many carbs you should have at each meal and for each snack. Foods that contain carbs include:  Bread, cereal, rice, pasta, and crackers.  Potatoes and corn.  Peas, beans, and lentils.  Milk and yogurt.  Fruit and juice.  Desserts, such as cakes, cookies, ice cream, and candy. How does alcohol affect me? Alcohol can cause a sudden decrease in blood glucose (hypoglycemia), especially if you use insulin or take certain oral diabetes medicines. Hypoglycemia can be a life-threatening condition. Symptoms of hypoglycemia (sleepiness, dizziness, and confusion) are similar to symptoms of having too much alcohol. If your health care provider says that alcohol is safe for you, follow these guidelines:  Limit alcohol intake to no more than 1 drink per day for nonpregnant women and 2 drinks per day for men. One drink equals 12 oz of beer, 5 oz of wine, or 1 oz of hard liquor.  Do not drink on an empty stomach.   Keep yourself hydrated with water, diet soda, or unsweetened iced tea.  Keep in mind that regular soda, juice, and other mixers may contain a lot of sugar and must be counted as carbs. What are tips for following this plan?  Reading food labels  Start by checking the serving size on the "Nutrition Facts" label of packaged foods and drinks. The amount of calories, carbs, fats, and other nutrients listed on the label is based on one serving of the item. Many items contain more than one serving per package.  Check the total grams (g) of carbs in one serving. You can calculate the number of servings of carbs in one serving by dividing the total carbs by 15. For example, if a food has 30 g of total carbs, it would be equal to 2 servings of carbs.  Check the number of grams (g) of saturated and trans fats in one serving. Choose foods that have low or no amount of these fats.  Check the number of milligrams (mg) of salt (sodium) in one serving. Most people should limit total sodium intake to less than 2,300 mg per day.  Always check the nutrition information of foods labeled as "low-fat" or "nonfat". These foods may be higher in added sugar or refined carbs and should be avoided.  Talk to your dietitian to identify your daily goals for nutrients listed on the label. Shopping  Avoid buying canned, premade, or processed foods. These foods tend to be high in fat, sodium, and added sugar.  Shop around the outside edge of the grocery store. This includes fresh fruits and vegetables, bulk grains, fresh meats, and fresh dairy. Cooking  Use low-heat cooking methods, such as baking, instead of high-heat cooking methods like deep frying.  Cook using healthy oils, such as olive, canola, or sunflower oil.  Avoid cooking with butter, cream, or high-fat meats. Meal planning  Eat meals and snacks regularly, preferably at the same times every day. Avoid going long periods of time without eating.  Eat  foods high in fiber, such as fresh fruits, vegetables, beans, and whole grains. Talk to your dietitian about how many servings of carbs you can eat at each meal.  Eat 4-6 ounces (oz) of lean protein each day, such as lean meat, chicken, fish, eggs, or tofu. One oz of lean protein is equal to: ? 1 oz of meat, chicken, or fish. ? 1 egg. ?  cup of tofu.  Eat some foods each day that contain healthy fats, such as avocado, nuts, seeds, and fish. Lifestyle  Check your blood glucose regularly.  Exercise regularly as told by your health care provider. This may include: ? 150 minutes of moderate-intensity or vigorous-intensity exercise each week. This could be brisk walking, biking, or water aerobics. ?  Stretching and doing strength exercises, such as yoga or weightlifting, at least 2 times a week.  Take medicines as told by your health care provider.  Do not use any products that contain nicotine or tobacco, such as cigarettes and e-cigarettes. If you need help quitting, ask your health care provider.  Work with a Social worker or diabetes educator to identify strategies to manage stress and any emotional and social challenges. Questions to ask a health care provider  Do I need to meet with a diabetes educator?  Do I need to meet with a dietitian?  What number can I call if I have questions?  When are the best times to check my blood glucose? Where to find more information:  American Diabetes Association: diabetes.org  Academy of Nutrition and Dietetics: www.eatright.[employer] of Diabetes and Digestive and Kidney Diseases (NIH): [employer] Summary  A healthy meal plan will help you control your blood glucose and maintain a healthy lifestyle.  Working with a diet and nutrition specialist (dietitian) can help you make a meal plan that is best for you.  Keep in mind that carbohydrates (carbs) and alcohol have immediate effects on your blood glucose levels. It is  important to count carbs and to use alcohol carefully. This information is not intended to replace advice given to you by your health care provider. Make sure you discuss any questions you have with your health care provider. Document Released: 05/27/2005 Document Revised: 03/30/2017 Document Reviewed: 10/04/2016 Elsevier Interactive Patient Education  2019 [employer].

## 2018-11-22 NOTE — Progress Notes (Signed)
Subjective:   Todd Zhang is a 73 y.o. male who presents for a subsequent Medicare Annual Wellness Visit.  Todd Zhang  Lives with his sister and nephew in [place], [place].  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  He enjoys fishing.  Todd Zhang has 2 children, 4 grandchildren, and 4 great grandchildren.  Patient Care Team: Todd Zhang as PCP - General (Physician Assistant) Valeria Batman, MD as Consulting Physician (Orthopedic Surgery)  Hospitalizations, surgeries, and ER visits in previous 12 months No hospitalizations, ER visits, or surgeries this past year.   Review of Systems    Patient reports that his overall health is better compared to last year.  Cardiac Risk Factors include: advanced age (>90men, >89 women);diabetes mellitus;hypertension;male gender;obesity (BMI >30kg/m2);sedentary lifestyle;dyslipidemia  Musculoskeletal- bilateral hip and leg pain  All other systems negative       Current Medications (verified) Outpatient Encounter Medications as of 11/22/2018  Medication Sig  . ACCU-CHEK AVIVA PLUS test strip TO check blood glucose twice daily  . alendronate (FOSAMAX) 70 MG tablet Take 1 tablet (70 mg total) by mouth once a week. Take with a full glass of water on an empty stomach.  . ALPRAZolam (XANAX) 0.5 MG tablet Take 1 tablet (0.5 mg total) by mouth at bedtime as needed for anxiety.  [name] atorvastatin (LIPITOR) 40 MG tablet Take 1 tablet (40 mg total) by mouth daily.  . benazepril (LOTENSIN) 5 MG tablet Take 1 tablet (5 mg total) by mouth daily.  [name] GLOBAL INJECT EASE INSULIN SYR 31G X 5/16" 0.3 ML MISC USE TO INJECT THREE TIMES DAILY  . insulin regular (HUMULIN R) 100 units/mL injection Inject 0.1 mLs (10 Units total) into the skin 3 (three) times daily before meals.  . metFORMIN (GLUCOPHAGE) 500 MG tablet Take 1 tablet (500 mg total) by mouth 2 (two) times daily.  . metoprolol succinate  (TOPROL-XL) 50 MG 24 hr tablet Take 1 tablet (50 mg total) by mouth daily.  . Multiple Vitamin (MULTIVITAMIN) tablet Take 1 tablet by mouth daily.  [name] omeprazole (PRILOSEC) 20 MG capsule Take 1 capsule (20 mg total) by mouth daily.  [name] oxyCODONE-acetaminophen (PERCOCET) 7.5-325 MG tablet Take 1 tablet by mouth every 4 (four) hours as needed for severe pain.  [name] oxyCODONE-acetaminophen (PERCOCET) 7.5-325 MG tablet Take 1 tablet by mouth every 4 (four) hours as needed for severe pain.  [name] oxyCODONE-acetaminophen (PERCOCET) 7.5-325 MG tablet Take 1 tablet by mouth every 4 (four) hours as needed for severe pain.  [name] pyridOXINE (VITAMIN B-6) 100 MG tablet Take 100 mg by mouth daily.  . tamsulosin (FLOMAX) 0.4 MG CAPS capsule Take 1 capsule (0.4 mg total) by mouth daily.  Todd Zhang 20 MG TABS tablet Take 1 tablet (20 mg total) by mouth daily.   No facility-administered encounter medications on file as of 11/22/2018.     Allergies (verified) Asa [aspirin] and Penicillins   History: Past Medical History:  Diagnosis Date  . Diabetes mellitus without complication (HCC)   . Hyperlipidemia   . Hypertension    Past Surgical History:  Procedure Laterality Date  . AMPUTATION TOE    . KNEE SURGERY Left   . NOSE SURGERY     Family History  Problem Relation Age of Onset  . Stroke Mother   . Heart disease Mother        CABG  . Cancer Father  Social History   Socioeconomic History  . Marital status: Single    Spouse name: Not on file  . Number of children: Not on file  . Years of education: Not on file  . Highest education level: 11th grade  Occupational History  . Not on file  Social Needs  . Financial resource strain: Not very hard  . Food insecurity:    Worry: Zhang true    Inability: Zhang true  . Transportation needs:    Medical: No    Non-medical: No  Tobacco Use  . Smoking status: Former Smoker    Packs/day: 1.00    Types: Cigarettes    Start date: 06/19/1966    Last attempt  to quit: 06/20/2011    Years since quitting: 7.4  . Smokeless tobacco: Zhang Used  Substance and Sexual Activity  . Alcohol use: No    Frequency: Zhang    Comment: quit drinking in 1982  . Drug use: Not Currently    Comment: marijuana and cocaine in the past, quit in 2012  . Sexual activity: Not on file  Lifestyle  . Physical activity:    Days per week: 0 days    Minutes per session: 0 min  . Stress: Only a little  Relationships  . Social connections:    Talks on phone: More than three times a week    Gets together: More than three times a week    Attends religious service: More than 4 times per year    Active member of club or organization: No    Attends meetings of clubs or organizations: Zhang    Relationship status: Widowed  Other Topics Concern  . Not on file  Social History Narrative  . Not on file     Clinical Intake:     Pain Score: 9                   Activities of Daily Living In your present state of health, do you have any difficulty performing the following activities: 11/22/2018  Hearing? Y  Comment Has been evaluated and was told he needs hearing aids, cannot afford them at this time  Vision? N  Difficulty concentrating or making decisions? Y  Comment Trouble remembering  Walking or climbing stairs? Y  Comment Due to hip and leg pain, and balance  Dressing or bathing? N  Doing errands, shopping? N  Preparing Food and eating ? N  Using the Toilet? N  In the past six months, have you accidently leaked urine? N  Do you have problems with loss of bowel control? N  Managing your Medications? N  Managing your Finances? N  Housekeeping or managing your Housekeeping? N  Some recent data might be hidden     Exercise Current Exercise Habits: The patient does not participate in regular exercise at present, Exercise limited by: orthopedic condition(s)  Diet Consumes 3 meals a day and 0 snacks a day.  The patient feels that he mostly follow a  Regular diet.  Diet History  Patient states he has access to all the food he needs.  Recommended diet of mostly non-starchy vegetables, lean proteins, and fruits and whole grains in moderation.   Depression Screen PHQ 2/9 Scores 11/22/2018 11/06/2018 08/04/2018 05/17/2018 05/03/2018 01/31/2018 11/28/2017  PHQ - 2 Score 0 0 1 0 0 0 0  PHQ- 9 Score - - - - - - -     Fall Risk Fall Risk  11/22/2018 11/06/2018 08/04/2018 05/17/2018 05/03/2018  Falls in the past year? 0 0 1 No No  Number falls in past yr: 0 - 1 - -  Injury with Fall? - - 1 - -  Risk Factor Category  - - - - -  Risk for fall due to : Impaired balance/gait - - - -  Follow up Education provided;Falls prevention discussed - - - -     Objective:    Today's Vitals   11/22/18 1011  BP: 123/69  Pulse: 82  Weight: 232 lb (105.2 kg)  Height: 6' (1.829 m)  PainSc: 9   PainLoc: Hip   Body mass index is 31.46 kg/m.  Advanced Directives 11/22/2018 10/26/2017  Does Patient Have a Medical Advance Directive? No No  Would patient like information on creating a medical advance directive? Yes (MAU/Ambulatory/Procedural Areas - Information given) Yes (ED - Information included in AVS)    Hearing/Vision  No hearing or vision deficits noted during visit. Patient states he has hearing loss and has been evaluated, but cannot afford hearing aids.    Cognitive Function: MMSE - Mini Mental State Exam 10/26/2017  Orientation to time 5  Orientation to Place 4  Registration 3  Attention/ Calculation 2  Recall 0  Language- name 2 objects 2  Language- repeat 1  Language- follow 3 step command 3  Language- read & follow direction 1  Write a sentence 1  Copy design 1  Total score 23     6CIT Screen 11/22/2018  What Year? 0 points  What month? 0 points  What time? 0 points  Count back from 20 0 points  Months in reverse 0 points  Repeat phrase 4 points  Total Score 4      Immunizations and Health Maintenance Immunization History    Administered Date(s) Administered  . Influenza, High Dose Seasonal PF 05/31/2017  . Influenza,inj,Quad PF,6+ Mos 06/19/2014  . Influenza,inj,quad, With Preservative 05/23/2018  . Pneumococcal Conjugate-13 05/12/2015  . Pneumococcal Polysaccharide-23 10/26/2017  . Tdap 05/12/2015  . Zoster 05/20/2015   Health Maintenance Due  Topic Date Due  . Hepatitis C Screening  [birth year]  . FOOT EXAM  07/29/1956  . OPHTHALMOLOGY EXAM  07/29/1955   Health Maintenance  Topic Date Due  . Hepatitis C Screening  1946-03-07  . FOOT EXAM  07/29/1955  . OPHTHALMOLOGY EXAM  07[birth year]29/1955  . HEMOGLOBIN A1C  05/07/2019  . COLONOSCOPY  06/19/2020  . TETANUS/TDAP  05/11/2025  . INFLUENZA VACCINE  Completed  . PNA vac Low Risk Adult  Completed   Recommend Hepatitis C screening and foot exam at next appointment with Prudy Feeler, PA  Patient is scheduled for eye exam with Dr. Mayford Knife in [place], [place] on 01/02/2019.       Assessment:   This is a routine wellness examination for [place].    Plan:    Goals    . DIET - REDUCE SUGAR INTAKE     Continue to watch carbohydrate intake by avoiding baked goods and candy as much as possible.         Health Maintenance & Additional Screening Recommendations: Advanced directives: has NO advanced directive  - add't info requested. Referral to SW: no  Lung: Low Dose CT Chest recommended if Age 62-80 years, 30 pack-year currently smoking OR have quit w/in 15years. Patient does qualify. Hepatitis C Screening recommended: yes    Keep f/u with Remus Loffler, PA-C and any other specialty appointments you may have Continue current medications Move carefully to avoid falls. Use assistive  devices like a cane or walker if needed. Aim for at least 150 minutes of moderate activity a week. This can be done with chair exercises if necessary. Read or work on puzzles daily Stay connected with friends and family  I have personally reviewed and noted the following in the  patient's chart:   . Medical and social history . Use of alcohol, tobacco or illicit drugs  . Current medications and supplements . Functional ability and status . Nutritional status . Physical activity . Advanced directives . List of other physicians . Hospitalizations, surgeries, and ER visits in previous 12 months . Vitals . Screenings to include cognitive, depression, and falls . Referrals and appointments  In addition, I have reviewed and discussed with patient certain preventive protocols, quality metrics, and best practice recommendations. A written personalized care plan for preventive services as well as general preventive health recommendations were provided to patient.     Lilia Argue, RN  11/22/2018

## 2018-12-03 ENCOUNTER — Other Ambulatory Visit: Payer: Self-pay | Admitting: Physician Assistant

## 2018-12-03 DIAGNOSIS — Z794 Long term (current) use of insulin: Secondary | ICD-10-CM

## 2018-12-03 DIAGNOSIS — E1140 Type 2 diabetes mellitus with diabetic neuropathy, unspecified: Secondary | ICD-10-CM

## 2018-12-03 DIAGNOSIS — I10 Essential (primary) hypertension: Secondary | ICD-10-CM

## 2019-01-26 ENCOUNTER — Other Ambulatory Visit: Payer: Self-pay | Admitting: Physician Assistant

## 2019-01-26 DIAGNOSIS — F411 Generalized anxiety disorder: Secondary | ICD-10-CM

## 2019-01-29 ENCOUNTER — Other Ambulatory Visit: Payer: Self-pay | Admitting: Physician Assistant

## 2019-01-29 DIAGNOSIS — E1140 Type 2 diabetes mellitus with diabetic neuropathy, unspecified: Secondary | ICD-10-CM

## 2019-01-29 DIAGNOSIS — Z794 Long term (current) use of insulin: Secondary | ICD-10-CM

## 2019-02-06 ENCOUNTER — Encounter: Payer: Self-pay | Admitting: Physician Assistant

## 2019-02-06 ENCOUNTER — Other Ambulatory Visit: Payer: Self-pay

## 2019-02-06 ENCOUNTER — Ambulatory Visit: Payer: Medicare Other | Admitting: Physician Assistant

## 2019-02-06 ENCOUNTER — Ambulatory Visit (INDEPENDENT_AMBULATORY_CARE_PROVIDER_SITE_OTHER): Payer: Medicare Other | Admitting: Physician Assistant

## 2019-02-06 DIAGNOSIS — M4802 Spinal stenosis, cervical region: Secondary | ICD-10-CM

## 2019-02-06 DIAGNOSIS — M545 Low back pain, unspecified: Secondary | ICD-10-CM

## 2019-02-06 DIAGNOSIS — F411 Generalized anxiety disorder: Secondary | ICD-10-CM | POA: Diagnosis not present

## 2019-02-06 DIAGNOSIS — M5030 Other cervical disc degeneration, unspecified cervical region: Secondary | ICD-10-CM | POA: Diagnosis not present

## 2019-02-06 DIAGNOSIS — I10 Essential (primary) hypertension: Secondary | ICD-10-CM

## 2019-02-06 DIAGNOSIS — I482 Chronic atrial fibrillation, unspecified: Secondary | ICD-10-CM

## 2019-02-06 DIAGNOSIS — E1140 Type 2 diabetes mellitus with diabetic neuropathy, unspecified: Secondary | ICD-10-CM

## 2019-02-06 DIAGNOSIS — G8929 Other chronic pain: Secondary | ICD-10-CM

## 2019-02-06 DIAGNOSIS — Z794 Long term (current) use of insulin: Secondary | ICD-10-CM

## 2019-02-06 MED ORDER — OXYCODONE-ACETAMINOPHEN 5-325 MG PO TABS: 1.0000 | ORAL_TABLET | Freq: Four times a day (QID) | ORAL | 0 refills | Status: DC | PRN

## 2019-02-06 MED ORDER — ALENDRONATE SODIUM 70 MG PO TABS: 70.0000 mg | ORAL_TABLET | ORAL | 5 refills | Status: DC

## 2019-02-06 MED ORDER — INSULIN REGULAR HUMAN 100 UNIT/ML IJ SOLN: INTRAMUSCULAR | 0 refills | Status: DC

## 2019-02-06 MED ORDER — OMEPRAZOLE 20 MG PO CPDR: 20.0000 mg | DELAYED_RELEASE_CAPSULE | Freq: Every day | ORAL | 5 refills | Status: DC

## 2019-02-06 MED ORDER — NALOXONE HCL 4 MG/0.1ML NA LIQD: 1.0000 | Freq: Once | NASAL | 1 refills | Status: AC

## 2019-02-06 MED ORDER — METFORMIN HCL 500 MG PO TABS: 500.0000 mg | ORAL_TABLET | Freq: Two times a day (BID) | ORAL | 5 refills | Status: DC

## 2019-02-06 MED ORDER — GABAPENTIN 100 MG PO CAPS: 100.0000 mg | ORAL_CAPSULE | Freq: Every day | ORAL | 3 refills | Status: DC

## 2019-02-06 MED ORDER — OXYCODONE-ACETAMINOPHEN 5-325 MG PO TABS: 1.0000 | ORAL_TABLET | ORAL | 0 refills | Status: DC | PRN

## 2019-02-06 MED ORDER — OXYCODONE-ACETAMINOPHEN 7.5-325 MG PO TABS: 1.0000 | ORAL_TABLET | Freq: Four times a day (QID) | ORAL | 0 refills | Status: DC | PRN

## 2019-02-06 MED ORDER — XARELTO 20 MG PO TABS: 20.0000 mg | ORAL_TABLET | Freq: Every day | ORAL | 5 refills | Status: DC

## 2019-02-06 MED ORDER — ATORVASTATIN CALCIUM 40 MG PO TABS: 40.0000 mg | ORAL_TABLET | Freq: Every day | ORAL | 5 refills | Status: DC

## 2019-02-06 MED ORDER — METOPROLOL SUCCINATE ER 50 MG PO TB24: 50.0000 mg | ORAL_TABLET | Freq: Every day | ORAL | 5 refills | Status: DC

## 2019-02-06 MED ORDER — ALPRAZOLAM 0.5 MG PO TABS: 0.5000 mg | ORAL_TABLET | Freq: Every evening | ORAL | 5 refills | Status: DC | PRN

## 2019-02-06 NOTE — Progress Notes (Signed)
Telephone visit  Subjective: CC: Generalized anxiety, chronic back pain, diabetes, hypertension, atrial fibrillation PCP: Todd Loffler, PA-C Todd Zhang is a 73 y.o. male calls for telephone consult today. Patient provides verbal consent for consult held via phone.  Patient is identified with 2 separate identifiers.  At this time the entire area is on COVID-19 social distancing and stay home orders are in place.  Patient is of higher risk and therefore we are performing this by a virtual method.  Location of patient: Home Location of provider: HOME Others present for call: No  This is for periodic recheck on the patient's chronic medical conditions.  He states that his diabetes has been very well controlled.  He is seen his sugars run anywhere from 100-1 50 very consistently.  He feels very satisfied with the effort he is making and the medication effect.  We will have labs performed when he comes into the office next time.  Atrial fibrillation and hypertension.  Medications do need to be refilled for these.  He states he has not had any difficulty with chest pain shortness of breath, headache.  He still follows with cardiology   PAIN ASSESSMENT: Cause of pain-degenerative disc disease cervical spine, spinal stenosis, degenerative disc disease lumbar spine Radiology: Broad-based posterior disk protrusions and bony ridging at C4-5 and C6-7.  Encroachment on the lateral recesses and foramina.  Foraminal stenosis on the left at C5-6.  Current medications-Percocet 7.5 mg 1 4 times a day Plan is to lower his dose to 5 mg over the the coming months.  He agrees with this reduction. Gabapentin will also be started, titrating up to 300 mg this month  Medication side effects- none Any concerns- working on titrating down  Pain on scale of 1-10- 8 Frequency-Daily What increases pain-standing and walking What makes pain Better-rest, medication Effects on ADL -moderate some days Any  change in general medical condition-no  Effectiveness of current meds-good Adverse reactions form pain meds-no PMP AWARE website reviewed: Yes Any suspicious activity on PMP Aware: No MME daily dose: 67 LME daily dose: 1  Contract on file 08/09/18 Last UDS  11/06/18, normal  NARCAN script sent or current  History of overdose or risk of abuse greater than 20 years, occasional use  ANXIETY ASSESSMENT Cause of anxiety: GAD This patient returns for a  6 month recheck on narcotic use for the above named condition(s)  Current medications-alprazolam 0.5 mg 1 at bedtime Is tried multiple SSRIs and SNRIs in the past to help and nothing was successful.  He has maintained on this dose for some time. Considering referral to [facility] for counseling. Offered to patient. Medication side effects-none Any concerns-no Any change in general medical condition-no Effectiveness of current meds-good PMP AWARE website reviewed: Yes Any suspicious activity on PMP Aware: No LME daily dose: 1  Contract on file 08/09/18 Last UDS  11/06/18, normal   Past Medical History:  Diagnosis Date  . Diabetes mellitus without complication (HCC)   . Hyperlipidemia   . Hypertension      ROS: Per HPI  Allergies  Allergen Reactions  . Asa [Aspirin] Hives  . Penicillins Hives   Past Medical History:  Diagnosis Date  . Diabetes mellitus without complication (HCC)   . Hyperlipidemia   . Hypertension     Current Outpatient Medications:  .  ACCU-CHEK AVIVA PLUS test strip, TO check blood glucose twice daily, Disp: 100 each, Rfl: 11 .  alendronate (FOSAMAX) 70 MG tablet, Take  1 tablet (70 mg total) by mouth once a week. Take with a full glass of water on an empty stomach., Disp: 4 tablet, Rfl: 5 .  ALPRAZolam (XANAX) 0.5 MG tablet, Take 1 tablet (0.5 mg total) by mouth at bedtime as needed for anxiety., Disp: 30 tablet, Rfl: 5 .  atorvastatin (LIPITOR) 40 MG tablet, Take 1 tablet (40 mg total) by mouth daily.,  Disp: 30 tablet, Rfl: 5 .  benazepril (LOTENSIN) 5 MG tablet, TAKE 1 TABLET BY MOUTH DAILY, Disp: 30 tablet, Rfl: 5 .  gabapentin (NEURONTIN) 100 MG capsule, Take 1-3 capsules (100-300 mg total) by mouth at bedtime. For pain, Disp: 90 capsule, Rfl: 3 .  GLOBAL INJECT EASE INSULIN SYR 31G X 5/16" 0.3 ML MISC, USE TO INJECT THREE TIMES DAILY, Disp: 300 each, Rfl: 2 .  insulin regular (HUMULIN R) 100 units/mL injection, INJECT 10 UNITS INTO SKIN INTO THE SKIN THREE TIMES DAILY BEFORE MEALS, Disp: 10 mL, Rfl: 0 .  metFORMIN (GLUCOPHAGE) 500 MG tablet, Take 1 tablet (500 mg total) by mouth 2 (two) times daily., Disp: 60 tablet, Rfl: 5 .  metoprolol succinate (TOPROL-XL) 50 MG 24 hr tablet, Take 1 tablet (50 mg total) by mouth daily., Disp: 30 tablet, Rfl: 5 .  Multiple Vitamin (MULTIVITAMIN) tablet, Take 1 tablet by mouth daily., Disp: , Rfl:  .  omeprazole (PRILOSEC) 20 MG capsule, Take 1 capsule (20 mg total) by mouth daily., Disp: 30 capsule, Rfl: 5 .  oxyCODONE-acetaminophen (PERCOCET) 5-325 MG tablet, Take 1 tablet by mouth every 4 (four) hours as needed for severe pain. 2 of 3, Disp: 120 tablet, Rfl: 0 .  oxyCODONE-acetaminophen (PERCOCET) 5-325 MG tablet, Take 1 tablet by mouth every 6 (six) hours as needed for severe pain. 3 of 3, Disp: 120 tablet, Rfl: 0 .  oxyCODONE-acetaminophen (PERCOCET) 7.5-325 MG tablet, Take 1 tablet by mouth every 6 (six) hours as needed for severe pain. 1 of 3, Disp: 120 tablet, Rfl: 0 .  pyridOXINE (VITAMIN B-6) 100 MG tablet, Take 100 mg by mouth daily., Disp: , Rfl:  .  tamsulosin (FLOMAX) 0.4 MG CAPS capsule, Take 1 capsule (0.4 mg total) by mouth daily., Disp: 30 capsule, Rfl: 11 .  XARELTO 20 MG TABS tablet, Take 1 tablet (20 mg total) by mouth daily., Disp: 30 tablet, Rfl: 5  Assessment/ Plan: 73 y.o. male   1. Generalized anxiety disorder - ALPRAZolam (XANAX) 0.5 MG tablet; Take 1 tablet (0.5 mg total) by mouth at bedtime as needed for anxiety.  Dispense: 30  tablet; Refill: 5  2. Chronic low back pain without sciatica, unspecified back pain laterality - oxyCODONE-acetaminophen (PERCOCET) 7.5-325 MG tablet; Take 1 tablet by mouth every 6 (six) hours as needed for severe pain. 1 of 3  Dispense: 120 tablet; Refill: 0 - gabapentin (NEURONTIN) 100 MG capsule; Take 1-3 capsules (100-300 mg total) by mouth at bedtime. For pain  Dispense: 90 capsule; Refill: 3 - oxyCODONE-acetaminophen (PERCOCET) 5-325 MG tablet; Take 1 tablet by mouth every 4 (four) hours as needed for severe pain. 2 of 3  Dispense: 120 tablet; Refill: 0 - oxyCODONE-acetaminophen (PERCOCET) 5-325 MG tablet; Take 1 tablet by mouth every 6 (six) hours as needed for severe pain. 3 of 3  Dispense: 120 tablet; Refill: 0  3. DDD (degenerative disc disease), cervical - oxyCODONE-acetaminophen (PERCOCET) 7.5-325 MG tablet; Take 1 tablet by mouth every 6 (six) hours as needed for severe pain. 1 of 3  Dispense: 120 tablet; Refill: 0 - gabapentin (  NEURONTIN) 100 MG capsule; Take 1-3 capsules (100-300 mg total) by mouth at bedtime. For pain  Dispense: 90 capsule; Refill: 3 - oxyCODONE-acetaminophen (PERCOCET) 5-325 MG tablet; Take 1 tablet by mouth every 4 (four) hours as needed for severe pain. 2 of 3  Dispense: 120 tablet; Refill: 0 - oxyCODONE-acetaminophen (PERCOCET) 5-325 MG tablet; Take 1 tablet by mouth every 6 (six) hours as needed for severe pain. 3 of 3  Dispense: 120 tablet; Refill: 0  4. Cervical spinal stenosis - oxyCODONE-acetaminophen (PERCOCET) 7.5-325 MG tablet; Take 1 tablet by mouth every 6 (six) hours as needed for severe pain. 1 of 3  Dispense: 120 tablet; Refill: 0 - oxyCODONE-acetaminophen (PERCOCET) 5-325 MG tablet; Take 1 tablet by mouth every 4 (four) hours as needed for severe pain. 2 of 3  Dispense: 120 tablet; Refill: 0 - oxyCODONE-acetaminophen (PERCOCET) 5-325 MG tablet; Take 1 tablet by mouth every 6 (six) hours as needed for severe pain. 3 of 3  Dispense: 120 tablet;  Refill: 0  5. Type 2 diabetes mellitus with diabetic neuropathy, with long-term current use of insulin (HCC) - insulin regular (HUMULIN R) 100 units/mL injection; INJECT 10 UNITS INTO SKIN INTO THE SKIN THREE TIMES DAILY BEFORE MEALS  Dispense: 10 mL; Refill: 0 - metFORMIN (GLUCOPHAGE) 500 MG tablet; Take 1 tablet (500 mg total) by mouth 2 (two) times daily.  Dispense: 60 tablet; Refill: 5  6. Essential hypertension - metoprolol succinate (TOPROL-XL) 50 MG 24 hr tablet; Take 1 tablet (50 mg total) by mouth daily.  Dispense: 30 tablet; Refill: 5  7. Chronic atrial fibrillation - XARELTO 20 MG TABS tablet; Take 1 tablet (20 mg total) by mouth daily.  Dispense: 30 tablet; Refill: 5   Start time: 9:37 AM End time: 9:50 AM  Meds ordered this encounter  Medications  . ALPRAZolam (XANAX) 0.5 MG tablet    Sig: Take 1 tablet (0.5 mg total) by mouth at bedtime as needed for anxiety.    Dispense:  30 tablet    Refill:  5    Order Specific Question:   Supervising Provider    Answer:   Raliegh IpGOTTSCHALK, ASHLY M [[id number]]  . oxyCODONE-acetaminophen (PERCOCET) 7.5-325 MG tablet    Sig: Take 1 tablet by mouth every 6 (six) hours as needed for severe pain. 1 of 3    Dispense:  120 tablet    Refill:  0    Order Specific Question:   Supervising Provider    Answer:   Raliegh IpGOTTSCHALK, ASHLY M [[id number]]  . gabapentin (NEURONTIN) 100 MG capsule    Sig: Take 1-3 capsules (100-300 mg total) by mouth at bedtime. For pain    Dispense:  90 capsule    Refill:  3    Order Specific Question:   Supervising Provider    Answer:   Raliegh IpGOTTSCHALK, ASHLY M [[id number]]  . oxyCODONE-acetaminophen (PERCOCET) 5-325 MG tablet    Sig: Take 1 tablet by mouth every 4 (four) hours as needed for severe pain. 2 of 3    Dispense:  120 tablet    Refill:  0    Fill 30 days from original script date    Order Specific Question:   Supervising Provider    Answer:   Raliegh IpGOTTSCHALK, ASHLY M [[id number]]  . oxyCODONE-acetaminophen (PERCOCET) 5-325 MG  tablet    Sig: Take 1 tablet by mouth every 6 (six) hours as needed for severe pain. 3 of 3    Dispense:  120 tablet    Refill:  0  Fill 60 days from original script date    Order Specific Question:   Supervising Provider    Answer:   Raliegh Ip [[id number]]  . insulin regular (HUMULIN R) 100 units/mL injection    Sig: INJECT 10 UNITS INTO SKIN INTO THE SKIN THREE TIMES DAILY BEFORE MEALS    Dispense:  10 mL    Refill:  0    This prescription was filled on 01/06/2019. Any refills authorized will be placed on file.    Order Specific Question:   Supervising Provider    Answer:   Raliegh Ip [[id number]]  . alendronate (FOSAMAX) 70 MG tablet    Sig: Take 1 tablet (70 mg total) by mouth once a week. Take with a full glass of water on an empty stomach.    Dispense:  4 tablet    Refill:  5    Order Specific Question:   Supervising Provider    Answer:   Raliegh Ip [[id number]]  . atorvastatin (LIPITOR) 40 MG tablet    Sig: Take 1 tablet (40 mg total) by mouth daily.    Dispense:  30 tablet    Refill:  5    Order Specific Question:   Supervising Provider    Answer:   Raliegh Ip [[id number]]  . metFORMIN (GLUCOPHAGE) 500 MG tablet    Sig: Take 1 tablet (500 mg total) by mouth 2 (two) times daily.    Dispense:  60 tablet    Refill:  5    Order Specific Question:   Supervising Provider    Answer:   Raliegh Ip [[id number]]  . metoprolol succinate (TOPROL-XL) 50 MG 24 hr tablet    Sig: Take 1 tablet (50 mg total) by mouth daily.    Dispense:  30 tablet    Refill:  5    Order Specific Question:   Supervising Provider    Answer:   Raliegh Ip [[id number]]  . omeprazole (PRILOSEC) 20 MG capsule    Sig: Take 1 capsule (20 mg total) by mouth daily.    Dispense:  30 capsule    Refill:  5    Order Specific Question:   Supervising Provider    Answer:   Raliegh Ip [[id number]]  . XARELTO 20 MG TABS tablet    Sig: Take 1 tablet (20 mg total) by mouth  daily.    Dispense:  30 tablet    Refill:  5    Order Specific Question:   Supervising Provider    Answer:   Raliegh Ip [[id number]]    Prudy Feeler PA-C [facility] [phone]

## 2019-02-06 NOTE — Progress Notes (Signed)
Agree with taper off of percocet in this patient who is also treated with Xanax.  Given age, combination of high risk medication and use of anticoagulation, patient is considered high risk for adverse events.  If he begins to have uncontrolled pain, low threshold to consider alternative therapies for pain relief.  I see that he has not tolerated SSRI/ SNRI.  Has he used Buspar before?  It may be a good option for him.  Katieann Hungate M. Nadine Counts, DO [facility]

## 2019-03-01 ENCOUNTER — Other Ambulatory Visit: Payer: Self-pay | Admitting: Physician Assistant

## 2019-03-07 ENCOUNTER — Other Ambulatory Visit: Payer: Self-pay | Admitting: Physician Assistant

## 2019-03-07 DIAGNOSIS — G8929 Other chronic pain: Secondary | ICD-10-CM

## 2019-03-07 DIAGNOSIS — M5030 Other cervical disc degeneration, unspecified cervical region: Secondary | ICD-10-CM

## 2019-03-07 DIAGNOSIS — M4802 Spinal stenosis, cervical region: Secondary | ICD-10-CM

## 2019-03-07 DIAGNOSIS — M545 Low back pain, unspecified: Secondary | ICD-10-CM

## 2019-03-07 NOTE — Telephone Encounter (Signed)
Yes I want to confirm that 3 prescriptions were sent on 02/06/2019.  They should be on file at his pharmacy.  They were sent electronically.

## 2019-03-07 NOTE — Telephone Encounter (Signed)
oxyCODONE-acetaminophen 7.5-325 MG tablet Commonly known as:  PERCOCET Take 1 tablet by mouth every 4 (four) hours as needed for severe pain.   oxyCODONE-acetaminophen 7.5-325 MG tablet Commonly known as:  PERCOCET Take 1 tablet by mouth every 4 (four) hours as needed for severe pain.   oxyCODONE-acetaminophen 7.5-325 MG tablet Commonly known as:  PERCOCET Take 1 tablet by mouth every 4 (four) hours as needed for severe pain.   Here is the info from the note.... He was coming down and next step will be 5

## 2019-03-26 ENCOUNTER — Other Ambulatory Visit (HOSPITAL_COMMUNITY)
Admission: RE | Admit: 2019-03-26 | Discharge: 2019-03-26 | Disposition: A | Discharge status: Home / Self Care | Payer: Medicare Other | Source: Ambulatory Visit | Attending: Family Medicine | Admitting: Family Medicine

## 2019-03-26 DIAGNOSIS — M86172 Other acute osteomyelitis, left ankle and foot: Secondary | ICD-10-CM | POA: Insufficient documentation

## 2019-03-26 DIAGNOSIS — Z5181 Encounter for therapeutic drug level monitoring: Secondary | ICD-10-CM | POA: Insufficient documentation

## 2019-03-26 LAB — CBC WITH DIFFERENTIAL/PLATELET
Abs Immature Granulocytes: 0.01 10*3/uL (ref 0.00–0.07)
Basophils Absolute: 0 10*3/uL (ref 0.0–0.1)
Basophils Relative: 0 %
Eosinophils Absolute: 0.1 10*3/uL (ref 0.0–0.5)
Eosinophils Relative: 1 %
HCT: 36.8 % — ABNORMAL LOW (ref 39.0–52.0)
Hemoglobin: 12.6 g/dL — ABNORMAL LOW (ref 13.0–17.0)
Immature Granulocytes: 0 %
Lymphocytes Relative: 57 %
Lymphs Abs: 4.3 10*3/uL — ABNORMAL HIGH (ref 0.7–4.0)
MCH: 30.7 pg (ref 26.0–34.0)
MCHC: 34.2 g/dL (ref 30.0–36.0)
MCV: 89.8 fL (ref 80.0–100.0)
Monocytes Absolute: 0.6 10*3/uL (ref 0.1–1.0)
Monocytes Relative: 8 %
Neutro Abs: 2.5 10*3/uL (ref 1.7–7.7)
Neutrophils Relative %: 34 %
Platelets: 172 10*3/uL (ref 150–400)
RBC: 4.1 MIL/uL — ABNORMAL LOW (ref 4.22–5.81)
RDW: 13.4 % (ref 11.5–15.5)
WBC: 7.5 10*3/uL (ref 4.0–10.5)
nRBC: 0 % (ref 0.0–0.2)

## 2019-03-26 LAB — COMPREHENSIVE METABOLIC PANEL
ALT: 22 U/L (ref 0–44)
AST: 23 U/L (ref 15–41)
Albumin: 3.9 g/dL (ref 3.5–5.0)
Alkaline Phosphatase: 71 U/L (ref 38–126)
Anion gap: 9 (ref 5–15)
BUN: 27 mg/dL — ABNORMAL HIGH (ref 8–23)
CO2: 25 mmol/L (ref 22–32)
Calcium: 9.3 mg/dL (ref 8.9–10.3)
Chloride: 103 mmol/L (ref 98–111)
Creatinine, Ser: 0.87 mg/dL (ref 0.61–1.24)
GFR calc Af Amer: >60 mL/min (ref >60)
GFR calc non Af Amer: >60 mL/min (ref >60)
Glucose, Bld: 161 mg/dL — ABNORMAL HIGH (ref 70–99)
Potassium: 4.2 mmol/L (ref 3.5–5.1)
Sodium: 137 mmol/L (ref 135–145)
Total Bilirubin: 0.4 mg/dL (ref 0.3–1.2)
Total Protein: 6.9 g/dL (ref 6.5–8.1)

## 2019-03-26 LAB — VANCOMYCIN, TROUGH: Vancomycin Tr: 16 ug/mL (ref 15–20)

## 2019-03-30 ENCOUNTER — Other Ambulatory Visit: Payer: Self-pay

## 2019-04-01 ENCOUNTER — Other Ambulatory Visit (HOSPITAL_COMMUNITY)
Admission: RE | Admit: 2019-04-01 | Discharge: 2019-04-01 | Disposition: A | Discharge status: Home / Self Care | Payer: Medicare Other | Source: Other Acute Inpatient Hospital | Attending: Family Medicine | Admitting: Family Medicine

## 2019-04-01 DIAGNOSIS — Z5181 Encounter for therapeutic drug level monitoring: Secondary | ICD-10-CM | POA: Diagnosis present

## 2019-04-01 DIAGNOSIS — M86172 Other acute osteomyelitis, left ankle and foot: Secondary | ICD-10-CM | POA: Insufficient documentation

## 2019-04-01 LAB — COMPREHENSIVE METABOLIC PANEL
ALT: 21 U/L (ref 0–44)
AST: 23 U/L (ref 15–41)
Albumin: 3.8 g/dL (ref 3.5–5.0)
Alkaline Phosphatase: 73 U/L (ref 38–126)
Anion gap: 9 (ref 5–15)
BUN: 20 mg/dL (ref 8–23)
CO2: 23 mmol/L (ref 22–32)
Calcium: 9 mg/dL (ref 8.9–10.3)
Chloride: 105 mmol/L (ref 98–111)
Creatinine, Ser: 0.95 mg/dL (ref 0.61–1.24)
GFR calc Af Amer: >60 mL/min (ref >60)
GFR calc non Af Amer: >60 mL/min (ref >60)
Glucose, Bld: 133 mg/dL — ABNORMAL HIGH (ref 70–99)
Potassium: 4 mmol/L (ref 3.5–5.1)
Sodium: 137 mmol/L (ref 135–145)
Total Bilirubin: 0.9 mg/dL (ref 0.3–1.2)
Total Protein: 6.8 g/dL (ref 6.5–8.1)

## 2019-04-01 LAB — CBC WITH DIFFERENTIAL/PLATELET
Abs Immature Granulocytes: 0.01 10*3/uL (ref 0.00–0.07)
Basophils Absolute: 0 10*3/uL (ref 0.0–0.1)
Basophils Relative: 0 %
Eosinophils Absolute: 0.1 10*3/uL (ref 0.0–0.5)
Eosinophils Relative: 2 %
HCT: 38 % — ABNORMAL LOW (ref 39.0–52.0)
Hemoglobin: 13 g/dL (ref 13.0–17.0)
Immature Granulocytes: 0 %
Lymphocytes Relative: 53 %
Lymphs Abs: 3.6 10*3/uL (ref 0.7–4.0)
MCH: 30.8 pg (ref 26.0–34.0)
MCHC: 34.2 g/dL (ref 30.0–36.0)
MCV: 90 fL (ref 80.0–100.0)
Monocytes Absolute: 0.6 10*3/uL (ref 0.1–1.0)
Monocytes Relative: 8 %
Neutro Abs: 2.5 10*3/uL (ref 1.7–7.7)
Neutrophils Relative %: 37 %
Platelets: 151 10*3/uL (ref 150–400)
RBC: 4.22 MIL/uL (ref 4.22–5.81)
RDW: 13.3 % (ref 11.5–15.5)
WBC: 6.8 10*3/uL (ref 4.0–10.5)
nRBC: 0 % (ref 0.0–0.2)

## 2019-04-01 LAB — SEDIMENTATION RATE: Sed Rate: 22 mm/hr — ABNORMAL HIGH (ref 0–16)

## 2019-04-01 LAB — VANCOMYCIN, TROUGH: Vancomycin Tr: 19 ug/mL (ref 15–20)

## 2019-04-02 ENCOUNTER — Encounter: Payer: Self-pay | Admitting: Physician Assistant

## 2019-04-02 ENCOUNTER — Other Ambulatory Visit: Payer: Self-pay

## 2019-04-02 ENCOUNTER — Ambulatory Visit (INDEPENDENT_AMBULATORY_CARE_PROVIDER_SITE_OTHER): Payer: Medicare Other | Admitting: Physician Assistant

## 2019-04-02 VITALS — BP 141/73 | HR 68 | Temp 97.8°F | Ht 72.0 in | Wt 224.0 lb

## 2019-04-02 DIAGNOSIS — M545 Low back pain, unspecified: Secondary | ICD-10-CM

## 2019-04-02 DIAGNOSIS — M869 Osteomyelitis, unspecified: Secondary | ICD-10-CM | POA: Diagnosis not present

## 2019-04-02 DIAGNOSIS — M5030 Other cervical disc degeneration, unspecified cervical region: Secondary | ICD-10-CM

## 2019-04-02 DIAGNOSIS — M4802 Spinal stenosis, cervical region: Secondary | ICD-10-CM | POA: Diagnosis not present

## 2019-04-02 DIAGNOSIS — G8929 Other chronic pain: Secondary | ICD-10-CM

## 2019-04-02 MED ORDER — OXYCODONE-ACETAMINOPHEN 5-325 MG PO TABS: 1.0000 | ORAL_TABLET | ORAL | 0 refills | Status: DC | PRN

## 2019-04-02 MED ORDER — OXYCODONE-ACETAMINOPHEN 5-325 MG PO TABS: 1.0000 | ORAL_TABLET | Freq: Four times a day (QID) | ORAL | 0 refills | Status: DC | PRN

## 2019-04-02 MED ORDER — OXYCODONE-ACETAMINOPHEN 7.5-325 MG PO TABS: 1.0000 | ORAL_TABLET | Freq: Four times a day (QID) | ORAL | 0 refills | Status: DC | PRN

## 2019-04-03 NOTE — Progress Notes (Signed)
BP (!) 141/73   Pulse 68   Temp 97.8 F (36.6 C) (Oral)   Ht 6' (1.829 m)   Wt 224 lb (101.6 kg)   BMI 30.38 kg/m    Subjective:    Patient ID: Todd Zhang, male    DOB: 09/24/1945, 73 y.o.   MRN: [id number]  HPI: Todd Zhang is a 73 y.o. male presenting on 04/02/2019 for Hospitalization Follow-up [facility] )  This patient comes in for follow-up of recent hospitalization for osteomyelitis.  He had infection and his toe on the left foot.  He is still on IV antibiotics and having home health care 1010.  He is doing much better.  All of his records are reviewed from [facility].  We will update his medications today and plan to see him back in a few weeks.  PAIN ASSESSMENT: Cause of pain-degenerative disc disease cervical spine, spinal stenosis, degenerative disc disease lumbar spine Radiology: Broad-based posterior disk protrusions and bony ridging at C4-5 and C6-7. Encroachment on the lateral recesses and foramina. Foraminal stenosis on the left at C5-6.  Current medications-Percocet 7.5 mg 1 4 times a day Plan is to lower his dose to 5 mg over the the coming months.  He agrees with this reduction. Gabapentin will also be started, titrating up to 300 mg this month  Medication side effects- none Any concerns- working on titrating down  Pain on scale of 1-10- 8 Frequency-Daily What increases pain-standing and walking What makes pain Better-rest, medication Effects on ADL -moderate some days Any change in general medical condition-no  Effectiveness of current meds-good Adverse reactions form pain meds-no PMP AWARE website reviewed: Yes Any suspicious activity on PMP Aware: No MME daily dose: 67 LME daily dose: 1  Contract on file 08/09/18 Last UDS  11/06/18, normal  NARCAN script sent or current  History of overdose or risk of abuse greater than 20 years, occasional use Past Medical History:  Diagnosis Date  . Atrial fibrillation ([place])   .  Cervical spinal stenosis   . DDD (degenerative disc disease), cervical   . DDD (degenerative disc disease), lumbar   . Diabetes mellitus without complication ([place])   . Hyperlipidemia   . Hypertension    Relevant past medical, surgical, family and social history reviewed and updated as indicated. Interim medical history since our last visit reviewed. Allergies and medications reviewed and updated. DATA REVIEWED: CHART IN EPIC  Family History reviewed for pertinent findings.  Review of Systems  Constitutional: Negative.  Negative for appetite change and fatigue.  Eyes: Negative for pain and visual disturbance.  Respiratory: Negative.  Negative for cough, chest tightness, shortness of breath and wheezing.   Cardiovascular: Negative.  Negative for chest pain, palpitations and leg swelling.  Gastrointestinal: Negative.  Negative for abdominal pain, diarrhea, nausea and vomiting.  Genitourinary: Negative.   Skin: Negative.  Negative for color change and rash.  Neurological: Negative.  Negative for weakness, numbness and headaches.  Psychiatric/Behavioral: Negative.     Allergies as of 04/02/2019      Reactions   Asa [aspirin] Hives   Penicillins Hives      Medication List       Accurate as of April 02, 2019 11:59 PM. If you have any questions, ask your nurse or doctor.        Accu-Chek Aviva Plus test strip Generic drug: glucose blood TO check blood glucose twice daily   alendronate 70 MG tablet Commonly known as: FOSAMAX Take  1 tablet (70 mg total) by mouth once a week. Take with a full glass of water on an empty stomach.   ALPRAZolam 0.5 MG tablet Commonly known as: XANAX Take 1 tablet (0.5 mg total) by mouth at bedtime as needed for anxiety.   atorvastatin 40 MG tablet Commonly known as: LIPITOR Take 1 tablet (40 mg total) by mouth daily.   benazepril 5 MG tablet Commonly known as: LOTENSIN TAKE 1 TABLET BY MOUTH DAILY   gabapentin 100 MG capsule Commonly known  as: NEURONTIN Take 1-3 capsules (100-300 mg total) by mouth at bedtime. For pain   insulin regular 100 units/mL injection Commonly known as: HumuLIN R INJECT 10 UNITS INTO SKIN INTO THE SKIN THREE TIMES DAILY BEFORE MEALS   Insulin Syringe-Needle U-100 31G X 5/16" 0.3 ML Misc Commonly known as: Global Inject Ease Insulin Syr Use with insulin Dx E11.9   metFORMIN 500 MG tablet Commonly known as: GLUCOPHAGE Take 1 tablet (500 mg total) by mouth 2 (two) times daily.   metoprolol succinate 50 MG 24 hr tablet Commonly known as: TOPROL-XL Take 1 tablet (50 mg total) by mouth daily.   multivitamin tablet Take 1 tablet by mouth daily.   omeprazole 20 MG capsule Commonly known as: PRILOSEC Take 1 capsule (20 mg total) by mouth daily.   oxyCODONE-acetaminophen 5-325 MG tablet Commonly known as: Percocet Take 1 tablet by mouth every 4 (four) hours as needed for severe pain. 2 of 3   oxyCODONE-acetaminophen 5-325 MG tablet Commonly known as: Percocet Take 1 tablet by mouth every 6 (six) hours as needed for severe pain. 3 of 3   oxyCODONE-acetaminophen 7.5-325 MG tablet Commonly known as: PERCOCET Take 1 tablet by mouth every 6 (six) hours as needed for severe pain. 1 of 3   pyridOXINE 100 MG tablet Commonly known as: VITAMIN B-6 Take 100 mg by mouth daily.   tamsulosin 0.4 MG Caps capsule Commonly known as: FLOMAX Take 1 capsule (0.4 mg total) by mouth daily.   Vancomycin 1,250 mg in sodium chloride 0.9 % 250 mL Inject 1,250 mg into the vein every 12 (twelve) hours.   Xarelto 20 MG Tabs tablet Generic drug: rivaroxaban Take 1 tablet (20 mg total) by mouth daily.          Objective:    BP (!) 141/73   Pulse 68   Temp 97.8 F (36.6 C) (Oral)   Ht 6' (1.829 m)   Wt 224 lb (101.6 kg)   BMI 30.38 kg/m   Allergies  Allergen Reactions  . Asa [Aspirin] Hives  . Penicillins Hives    Wt Readings from Last 3 Encounters:  04/02/19 224 lb (101.6 kg)  11/22/18 232 lb  (105.2 kg)  11/06/18 239 lb (108.4 kg)    Physical Exam Vitals signs and nursing note reviewed.  Constitutional:      General: He is not in acute distress.    Appearance: He is well-developed.  HENT:     Head: Normocephalic and atraumatic.  Eyes:     Conjunctiva/sclera: Conjunctivae normal.     Pupils: Pupils are equal, round, and reactive to light.  Cardiovascular:     Rate and Rhythm: Normal rate and regular rhythm.     Heart sounds: Normal heart sounds.  Pulmonary:     Effort: Pulmonary effort is normal. No respiratory distress.     Breath sounds: Normal breath sounds.  Skin:    General: Skin is warm and dry.  Psychiatric:  Behavior: Behavior normal.     Results for orders placed or performed during the hospital encounter of 04/01/19  Comprehensive metabolic panel  Result Value Ref Range   Sodium 137 135 - 145 mmol/L   Potassium 4.0 3.5 - 5.1 mmol/L   Chloride 105 98 - 111 mmol/L   CO2 23 22 - 32 mmol/L   Glucose, Bld 133 (H) 70 - 99 mg/dL   BUN 20 8 - 23 mg/dL   Creatinine, Ser 9.810.95 0.61 - 1.24 mg/dL   Calcium 9.0 8.9 - 19.110.3 mg/dL   Total Protein 6.8 6.5 - 8.1 g/dL   Albumin 3.8 3.5 - 5.0 g/dL   AST 23 15 - 41 U/L   ALT 21 0 - 44 U/L   Alkaline Phosphatase 73 38 - 126 U/L   Total Bilirubin 0.9 0.3 - 1.2 mg/dL   GFR calc non Af Amer >60 >60 mL/min   GFR calc Af Amer >60 >60 mL/min   Anion gap 9 5 - 15  CBC with Differential/Platelet  Result Value Ref Range   WBC 6.8 4.0 - 10.5 K/uL   RBC 4.22 4.22 - 5.81 MIL/uL   Hemoglobin 13.0 13.0 - 17.0 g/dL   HCT 47.838.0 (L) 29.539.0 - 62.152.0 %   MCV 90.0 80.0 - 100.0 fL   MCH 30.8 26.0 - 34.0 pg   MCHC 34.2 30.0 - 36.0 g/dL   RDW 30.813.3 65.711.5 - 84.615.5 %   Platelets 151 150 - 400 K/uL   nRBC 0.0 0.0 - 0.2 %   Neutrophils Relative % 37 %   Neutro Abs 2.5 1.7 - 7.7 K/uL   Lymphocytes Relative 53 %   Lymphs Abs 3.6 0.7 - 4.0 K/uL   Monocytes Relative 8 %   Monocytes Absolute 0.6 0.1 - 1.0 K/uL   Eosinophils Relative 2 %    Eosinophils Absolute 0.1 0.0 - 0.5 K/uL   Basophils Relative 0 %   Basophils Absolute 0.0 0.0 - 0.1 K/uL   Immature Granulocytes 0 %   Abs Immature Granulocytes 0.01 0.00 - 0.07 K/uL  Vancomycin, trough  Result Value Ref Range   Vancomycin Tr 19 15 - 20 ug/mL  Sedimentation rate  Result Value Ref Range   Sed Rate 22 (H) 0 - 16 mm/hr      Assessment & Plan:   1. Chronic low back pain without sciatica, unspecified back pain laterality - oxyCODONE-acetaminophen (PERCOCET) 5-325 MG tablet; Take 1 tablet by mouth every 4 (four) hours as needed for severe pain. 2 of 3  Dispense: 120 tablet; Refill: 0 - oxyCODONE-acetaminophen (PERCOCET) 5-325 MG tablet; Take 1 tablet by mouth every 6 (six) hours as needed for severe pain. 3 of 3  Dispense: 120 tablet; Refill: 0 - oxyCODONE-acetaminophen (PERCOCET) 7.5-325 MG tablet; Take 1 tablet by mouth every 6 (six) hours as needed for severe pain. 1 of 3  Dispense: 120 tablet; Refill: 0  2. DDD (degenerative disc disease), cervical - oxyCODONE-acetaminophen (PERCOCET) 5-325 MG tablet; Take 1 tablet by mouth every 4 (four) hours as needed for severe pain. 2 of 3  Dispense: 120 tablet; Refill: 0 - oxyCODONE-acetaminophen (PERCOCET) 5-325 MG tablet; Take 1 tablet by mouth every 6 (six) hours as needed for severe pain. 3 of 3  Dispense: 120 tablet; Refill: 0 - oxyCODONE-acetaminophen (PERCOCET) 7.5-325 MG tablet; Take 1 tablet by mouth every 6 (six) hours as needed for severe pain. 1 of 3  Dispense: 120 tablet; Refill: 0  3. Cervical spinal stenosis - oxyCODONE-acetaminophen (  PERCOCET) 5-325 MG tablet; Take 1 tablet by mouth every 4 (four) hours as needed for severe pain. 2 of 3  Dispense: 120 tablet; Refill: 0 - oxyCODONE-acetaminophen (PERCOCET) 5-325 MG tablet; Take 1 tablet by mouth every 6 (six) hours as needed for severe pain. 3 of 3  Dispense: 120 tablet; Refill: 0 - oxyCODONE-acetaminophen (PERCOCET) 7.5-325 MG tablet; Take 1 tablet by mouth every 6  (six) hours as needed for severe pain. 1 of 3  Dispense: 120 tablet; Refill: 0  4. Osteomyelitis of left foot, unspecified type (HCC) - Vancomycin 1,250 mg in sodium chloride 0.9 % 250 mL; Inject 1,250 mg into the vein every 12 (twelve) hours.   Continue all other maintenance medications as listed above.  Follow up plan: Return in about 6 weeks (around 05/14/2019) for recheck medications.  Educational handout given for survey  Remus LofflerAngel S. Margarita Bobrowski PA-C [facility] [address]  [place], [place] [id number] [phone]   04/03/2019, 10:24 PM

## 2019-04-04 ENCOUNTER — Other Ambulatory Visit: Payer: Self-pay | Admitting: Physician Assistant

## 2019-04-04 DIAGNOSIS — E1140 Type 2 diabetes mellitus with diabetic neuropathy, unspecified: Secondary | ICD-10-CM

## 2019-04-16 ENCOUNTER — Ambulatory Visit: Payer: Medicare Other | Admitting: Physician Assistant

## 2019-04-17 ENCOUNTER — Telehealth: Payer: Self-pay | Admitting: *Deleted

## 2019-04-17 NOTE — Telephone Encounter (Signed)
TC from [facility] w/ [facility] They deliver his Vancomycin Pt stated that as of tomorrow he will be homeless Company wanting advise on Vancomycin delivery They can not deliver to pt w/o an address, also [facility] has an order to pull the pic line on Friday, but they have the vancomycin is to go through till the 21st Please advise

## 2019-04-17 NOTE — Telephone Encounter (Signed)
Did you ask the patient if he is going to live with a friend or relative that has an address for him to use?

## 2019-04-19 ENCOUNTER — Telehealth: Payer: Self-pay | Admitting: Physician Assistant

## 2019-04-19 NOTE — Telephone Encounter (Signed)
Number given to Dr. Sheron Nightingale office

## 2019-04-28 ENCOUNTER — Other Ambulatory Visit: Payer: Self-pay | Admitting: Physician Assistant

## 2019-04-28 DIAGNOSIS — E1140 Type 2 diabetes mellitus with diabetic neuropathy, unspecified: Secondary | ICD-10-CM

## 2019-05-02 NOTE — Telephone Encounter (Signed)
Closing encounter, unable to reach pt, VM not setup

## 2019-05-08 ENCOUNTER — Encounter: Payer: Medicare Other | Admitting: Vascular Surgery

## 2019-05-09 ENCOUNTER — Encounter: Payer: Self-pay | Admitting: Family

## 2019-05-11 ENCOUNTER — Other Ambulatory Visit: Payer: Self-pay

## 2019-05-14 ENCOUNTER — Other Ambulatory Visit: Payer: Self-pay

## 2019-05-14 ENCOUNTER — Ambulatory Visit (INDEPENDENT_AMBULATORY_CARE_PROVIDER_SITE_OTHER): Payer: Medicare Other | Admitting: Physician Assistant

## 2019-05-14 ENCOUNTER — Encounter: Payer: Self-pay | Admitting: Physician Assistant

## 2019-05-14 VITALS — BP 113/65 | HR 80 | Temp 98.0°F | Ht 72.0 in | Wt 221.4 lb

## 2019-05-14 DIAGNOSIS — J441 Chronic obstructive pulmonary disease with (acute) exacerbation: Secondary | ICD-10-CM | POA: Diagnosis not present

## 2019-05-14 DIAGNOSIS — Z0000 Encounter for general adult medical examination without abnormal findings: Secondary | ICD-10-CM

## 2019-05-14 DIAGNOSIS — E1140 Type 2 diabetes mellitus with diabetic neuropathy, unspecified: Secondary | ICD-10-CM | POA: Diagnosis not present

## 2019-05-14 DIAGNOSIS — M5030 Other cervical disc degeneration, unspecified cervical region: Secondary | ICD-10-CM

## 2019-05-14 DIAGNOSIS — Z794 Long term (current) use of insulin: Secondary | ICD-10-CM

## 2019-05-14 DIAGNOSIS — G8929 Other chronic pain: Secondary | ICD-10-CM

## 2019-05-14 DIAGNOSIS — M545 Low back pain, unspecified: Secondary | ICD-10-CM

## 2019-05-14 MED ORDER — METHYLPREDNISOLONE ACETATE 80 MG/ML IJ SUSP
80.0000 mg | Freq: Once | INTRAMUSCULAR | Status: AC
  Administered 2019-05-14: 80 mg via INTRAMUSCULAR

## 2019-05-14 MED ORDER — GABAPENTIN 300 MG PO CAPS: 300.0000 mg | ORAL_CAPSULE | Freq: Three times a day (TID) | ORAL | 5 refills | Status: DC

## 2019-05-14 NOTE — Progress Notes (Signed)
BP 113/65   Pulse 80   Temp 98 F (36.7 C) (Temporal)   Ht 6' (1.829 m)   Wt 221 lb 6.4 oz (100.4 kg)   BMI 30.03 kg/m    Subjective:    Patient ID: Todd Zhang, male    DOB: Dec 19, 1945, 73 y.o.   MRN: [id number]  HPI: Todd Zhang is a 73 y.o. male presenting on 05/14/2019 for Pain, Diabetes, and Medical Management of Chronic Issues  PAIN ASSESSMENT: Cause of pain-degenerative disc disease cervical spine, spinal stenosis, degenerative disc disease lumbar spine Radiology:Broad-based posterior disk protrusions and bony ridging at C4-5 and C6-7. Encroachment on the lateral recesses and foramina. Foraminal stenosis on the left at C5-6.  Current medications-Percocet 5 mg 1 4 times a day Still  Increasing Gabapentin to 300 TID (had been QD) Medication side effects-none Any concerns-working on titrating down  Pain on scale of 1-10-8 Frequency-Daily What increases pain-standing and walking What makes pain Better-rest, medication Effects on ADL -moderate some days Any change in general medical condition-no  Effectiveness of current meds-good Adverse reactions form pain meds-no PMP AWARE website reviewed:Yes Any suspicious activity on PMP Aware:No MME daily dose:30 (prior 67) LME daily dose:1  Contract on file11/27/19 Last UDS2/24/20, normal  NARCAN script sent or current  Past Medical History:  Diagnosis Date  . Atrial fibrillation (HCC)   . Cervical spinal stenosis   . DDD (degenerative disc disease), cervical   . DDD (degenerative disc disease), lumbar   . Diabetes mellitus without complication (HCC)   . Hyperlipidemia   . Hypertension    Relevant past medical, surgical, family and social history reviewed and updated as indicated. Interim medical history since our last visit reviewed. Allergies and medications reviewed and updated. DATA REVIEWED: CHART IN EPIC  Family History reviewed for pertinent findings.  Review of Systems   Constitutional: Positive for fatigue. Negative for appetite change.  HENT: Negative.   Eyes: Negative.  Negative for pain and visual disturbance.  Respiratory: Negative.  Negative for cough, chest tightness, shortness of breath and wheezing.   Cardiovascular: Negative.  Negative for chest pain, palpitations and leg swelling.  Gastrointestinal: Negative.  Negative for abdominal pain, diarrhea, nausea and vomiting.  Endocrine: Negative.   Genitourinary: Negative.   Musculoskeletal: Positive for arthralgias and gait problem.  Skin: Negative.  Negative for color change and rash.  Neurological: Negative for weakness, numbness and headaches.  Psychiatric/Behavioral: Negative.     Allergies as of 05/14/2019      Reactions   Asa [aspirin] Hives   Penicillins Hives      Medication List       Accurate as of May 14, 2019 11:59 PM. If you have any questions, ask your nurse or doctor.        Accu-Chek Aviva Plus test strip Generic drug: glucose blood TO check blood glucose twice daily   alendronate 70 MG tablet Commonly known as: FOSAMAX Take 1 tablet (70 mg total) by mouth once a week. Take with a full glass of water on an empty stomach.   ALPRAZolam 0.5 MG tablet Commonly known as: XANAX Take 1 tablet (0.5 mg total) by mouth at bedtime as needed for anxiety.   atorvastatin 40 MG tablet Commonly known as: LIPITOR Take 1 tablet (40 mg total) by mouth daily.   benazepril 5 MG tablet Commonly known as: LOTENSIN TAKE 1 TABLET BY MOUTH DAILY   gabapentin 300 MG capsule Commonly known as: NEURONTIN Take 1 capsule (300 mg total)  by mouth 3 (three) times daily. For pain What changed:   medication strength  how much to take  when to take this Changed by: Remus LofflerAngel S Verniece Encarnacion, PA-C   insulin regular 100 units/mL injection Commonly known as: HumuLIN R INJECT 10 UNITS INTO THE SKIN THREE TIMES DAILY BEFORE MEALS   Insulin Syringe-Needle U-100 31G X 5/16" 0.3 ML Misc Commonly known  as: Global Inject Ease Insulin Syr Use with insulin Dx E11.9   levofloxacin 500 MG tablet Commonly known as: LEVAQUIN Take 500 mg by mouth daily.   metFORMIN 500 MG tablet Commonly known as: GLUCOPHAGE Take 1 tablet (500 mg total) by mouth 2 (two) times daily.   metoprolol succinate 50 MG 24 hr tablet Commonly known as: TOPROL-XL Take 1 tablet (50 mg total) by mouth daily.   multivitamin tablet Take 1 tablet by mouth daily.   omeprazole 20 MG capsule Commonly known as: PRILOSEC Take 1 capsule (20 mg total) by mouth daily.   oxyCODONE-acetaminophen 5-325 MG tablet Commonly known as: Percocet Take 1 tablet by mouth every 4 (four) hours as needed for severe pain. 2 of 3   oxyCODONE-acetaminophen 5-325 MG tablet Commonly known as: Percocet Take 1 tablet by mouth every 6 (six) hours as needed for severe pain. 3 of 3   oxyCODONE-acetaminophen 7.5-325 MG tablet Commonly known as: PERCOCET Take 1 tablet by mouth every 6 (six) hours as needed for severe pain. 1 of 3   pyridOXINE 100 MG tablet Commonly known as: VITAMIN B-6 Take 100 mg by mouth daily.   tamsulosin 0.4 MG Caps capsule Commonly known as: FLOMAX Take 1 capsule (0.4 mg total) by mouth daily.   Vancomycin 1,250 mg in sodium chloride 0.9 % 250 mL Inject 1,250 mg into the vein every 12 (twelve) hours.   Xarelto 20 MG Tabs tablet Generic drug: rivaroxaban Take 1 tablet (20 mg total) by mouth daily.          Objective:    BP 113/65   Pulse 80   Temp 98 F (36.7 C) (Temporal)   Ht 6' (1.829 m)   Wt 221 lb 6.4 oz (100.4 kg)   BMI 30.03 kg/m   Allergies  Allergen Reactions  . Asa [Aspirin] Hives  . Penicillins Hives    Wt Readings from Last 3 Encounters:  05/14/19 221 lb 6.4 oz (100.4 kg)  04/02/19 224 lb (101.6 kg)  11/22/18 232 lb (105.2 kg)    Physical Exam Vitals signs and nursing note reviewed.  Constitutional:      General: He is not in acute distress.    Appearance: He is well-developed.   HENT:     Head: Normocephalic and atraumatic.  Eyes:     Conjunctiva/sclera: Conjunctivae normal.     Pupils: Pupils are equal, round, and reactive to light.  Cardiovascular:     Rate and Rhythm: Normal rate and regular rhythm.     Heart sounds: Normal heart sounds.  Pulmonary:     Effort: Pulmonary effort is normal. No respiratory distress.     Breath sounds: Normal breath sounds.  Skin:    General: Skin is warm and dry.  Psychiatric:        Behavior: Behavior normal.     Results for orders placed or performed during the hospital encounter of 04/01/19  Comprehensive metabolic panel  Result Value Ref Range   Sodium 137 135 - 145 mmol/L   Potassium 4.0 3.5 - 5.1 mmol/L   Chloride 105 98 - 111 mmol/L  CO2 23 22 - 32 mmol/L   Glucose, Bld 133 (H) 70 - 99 mg/dL   BUN 20 8 - 23 mg/dL   Creatinine, Ser 0.95 0.61 - 1.24 mg/dL   Calcium 9.0 8.9 - 10.3 mg/dL   Total Protein 6.8 6.5 - 8.1 g/dL   Albumin 3.8 3.5 - 5.0 g/dL   AST 23 15 - 41 U/L   ALT 21 0 - 44 U/L   Alkaline Phosphatase 73 38 - 126 U/L   Total Bilirubin 0.9 0.3 - 1.2 mg/dL   GFR calc non Af Amer >60 >60 mL/min   GFR calc Af Amer >60 >60 mL/min   Anion gap 9 5 - 15  CBC with Differential/Platelet  Result Value Ref Range   WBC 6.8 4.0 - 10.5 K/uL   RBC 4.22 4.22 - 5.81 MIL/uL   Hemoglobin 13.0 13.0 - 17.0 g/dL   HCT 38.0 (L) 39.0 - 52.0 %   MCV 90.0 80.0 - 100.0 fL   MCH 30.8 26.0 - 34.0 pg   MCHC 34.2 30.0 - 36.0 g/dL   RDW 13.3 11.5 - 15.5 %   Platelets 151 150 - 400 K/uL   nRBC 0.0 0.0 - 0.2 %   Neutrophils Relative % 37 %   Neutro Abs 2.5 1.7 - 7.7 K/uL   Lymphocytes Relative 53 %   Lymphs Abs 3.6 0.7 - 4.0 K/uL   Monocytes Relative 8 %   Monocytes Absolute 0.6 0.1 - 1.0 K/uL   Eosinophils Relative 2 %   Eosinophils Absolute 0.1 0.0 - 0.5 K/uL   Basophils Relative 0 %   Basophils Absolute 0.0 0.0 - 0.1 K/uL   Immature Granulocytes 0 %   Abs Immature Granulocytes 0.01 0.00 - 0.07 K/uL   Vancomycin, trough  Result Value Ref Range   Vancomycin Tr 19 15 - 20 ug/mL  Sedimentation rate  Result Value Ref Range   Sed Rate 22 (H) 0 - 16 mm/hr      Assessment & Plan:   1. Type 2 diabetes mellitus with diabetic neuropathy, with long-term current use of insulin (HCC) - Bayer DCA Hb A1c Waived - Ambulatory referral to Chronic Care Management Services  2. Well adult exam - Bayer DCA Hb A1c Waived - PSA  3. COPD exacerbation (HCC) - levofloxacin (LEVAQUIN) 500 MG tablet; Take 500 mg by mouth daily. - Ambulatory referral to Chronic Care Management Services  4. Chronic low back pain without sciatica, unspecified back pain laterality - gabapentin (NEURONTIN) 300 MG capsule; Take 1 capsule (300 mg total) by mouth 3 (three) times daily. For pain  Dispense: 90 capsule; Refill: 5 - methylPREDNISolone acetate (DEPO-MEDROL) injection 80 mg - Ambulatory referral to Chronic Care Management Services  5. DDD (degenerative disc disease), cervical - gabapentin (NEURONTIN) 300 MG capsule; Take 1 capsule (300 mg total) by mouth 3 (three) times daily. For pain  Dispense: 90 capsule; Refill: 5 - methylPREDNISolone acetate (DEPO-MEDROL) injection 80 mg - Ambulatory referral to Chronic Care Management Services   Continue all other maintenance medications as listed above.  Follow up plan: Return in about 6 weeks (around 06/26/2019) for recheck medications.  Educational handout given for [place] PA-C [facility] [address]  [place], [place] [address] [phone]   05/21/2019, 8:12 PM

## 2019-05-20 ENCOUNTER — Other Ambulatory Visit: Payer: Self-pay | Admitting: Physician Assistant

## 2019-05-20 DIAGNOSIS — M5030 Other cervical disc degeneration, unspecified cervical region: Secondary | ICD-10-CM

## 2019-05-20 DIAGNOSIS — M545 Low back pain, unspecified: Secondary | ICD-10-CM

## 2019-05-20 DIAGNOSIS — I10 Essential (primary) hypertension: Secondary | ICD-10-CM

## 2019-05-20 DIAGNOSIS — G8929 Other chronic pain: Secondary | ICD-10-CM

## 2019-05-22 NOTE — Progress Notes (Signed)
Recommend taper from benzodiazepine in the setting of age, concomitant use of opioid.  Continue to taper opioid to lowest effective dose or off if tolerated.  Ashly M. Lajuana Ripple, [facility]

## 2019-05-23 ENCOUNTER — Other Ambulatory Visit: Payer: Self-pay | Admitting: Physician Assistant

## 2019-05-23 DIAGNOSIS — E1140 Type 2 diabetes mellitus with diabetic neuropathy, unspecified: Secondary | ICD-10-CM

## 2019-05-24 ENCOUNTER — Ambulatory Visit: Payer: Medicare Other | Admitting: *Deleted

## 2019-05-24 ENCOUNTER — Encounter: Payer: Self-pay | Admitting: *Deleted

## 2019-05-24 DIAGNOSIS — M21962 Unspecified acquired deformity of left lower leg: Secondary | ICD-10-CM

## 2019-05-24 DIAGNOSIS — E1140 Type 2 diabetes mellitus with diabetic neuropathy, unspecified: Secondary | ICD-10-CM

## 2019-05-24 DIAGNOSIS — F411 Generalized anxiety disorder: Secondary | ICD-10-CM

## 2019-05-24 NOTE — Patient Instructions (Signed)
Visit Information  Goals Addressed            This Visit's Progress     Patient Stated   . "I need stable housing" (pt-stated)       Current Barriers:  . Lacks caregiver support.  . Film/video editor.  Lauralyn Primes barriers  Nurse Case Manager Clinical Goal(s):  [name] Over the next 30 days, patient will work with CCM team to address needs related to homelessness  Interventions:  . Social Work referral for homelessness . Discussed current living arrangements with patient.  o States that he has not been without shelter any this past year o He stays with various friends and some of them have pets indoors and fleas o He has furniture and belongings in storage o He has plans to move into a ground floor apartment on 06/16/19. He will be renting from a friend that he trusts.  - Rent is not income based but he says it is reasonable and it will include all of his utilities so he only has to worry about one bill a month o Patient has a very positive attitude about his current situation o Answered that he has two children when asked, but did not discuss them. No other family was mentioned either.  Patient Self Care Activities:  . Performs ADL's independently . Performs IADL's independently  Initial goal documentation     . "I want my toe to heal up" (pt-stated)       Current Barriers:  [name] Knowledge Deficits related to diabetic foot care and wound care . 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 Over the next 30 days, patient will attend all scheduled medical appointments: Dr Irving Shows Ludwig Clarks 05/26/2019  Interventions:  . Reviewed medications with patient and discussed antibiotics-currently taking Levaquin orally and had vancomycin IV discontinued . Discussed  HPI o Patient reports a healing wound on left toe o Taking Levaquin orally o Seeing Dr Irving Shows regularly. Next appt is tomorrow.  o States "Dr Irving Shows wants me to have a test on my legs to see how the blood is flowing but I can't go to [facility] to have that done." - RNCM will reach out to Dr Irving Shows to discuss options - Patient is willing and able to go to [facility] . Advised to check feet daily and report any new sores, ulcerations, or worsening of current condition by calling [facility] at [phone] or me at [phone]. [name] Discussed proper foot care  Patient Self Care Activities:  . Performs ADL's independently . Performs IADL's independently  Initial goal documentation      . "I want to make sure I have enough food" (pt-stated)       Current Barriers:  . Lacks caregiver support.  . Film/video editor.  Lauralyn Primes barriers  Nurse Case Manager Clinical Goal(s):  [name] Over the next 30 days, patient will work with CCM team to address needs related to food insecurity and acquisition.   Interventions:  . Collaborated with Theadore Nan, LCSW regarding food insecurity . Social Work referral for food insecurity . Discussed current situation with patient o He is receiving food stamps o There have been recent times that he was concerned he would run out of food before the end of the month o He has not gone  without food any within the past month o He does stretch out his food stamps and manages his allotment to make sure he has enough for the month  Patient Self Care Activities:  . Performs ADL's independently . Performs IADL's independently  Initial goal documentation      Todd Zhang was given information about Chronic Care Management services today including:  1. CCM service includes personalized support from designated clinical staff supervised by his physician, including individualized plan of care and coordination with other care providers 2. 24/7 contact phone numbers for  assistance for urgent and routine care needs. 3. Service will only be billed when office clinical staff spend 20 minutes or more in a month to coordinate care. 4. Only one practitioner may furnish and bill the service in a calendar month. 5. The patient may stop CCM services at any time (effective at the end of the month) by phone call to the office staff. 6. The patient will be responsible for cost sharing (co-pay) of up to 20% of the service fee (after annual deductible is met).   Patient agreed to services and verbal consent obtained.   The patient verbalized understanding of instructions provided today and declined a print copy of patient instruction materials.    The care management team will reach out to the patient again over the next 14 days.   Patient will talk with LCSW regarding psychosocial issues.  Patient will reach out to the CCM as needed with any questions or concerns.   Chong Sicilian BSN, RN-BC Embedded Chronic Care Manager [facility] Management Direct Dial: [phone]

## 2019-05-24 NOTE — Chronic Care Management (AMB) (Signed)
Chronic Care Management   Initial Visit Note  05/24/2019 Name: Todd Zhang MRN: [id number] DOB: 1945/10/10  Referred by: Terald Sleeper, PA-C Reason for referral : Chronic Care Management (RNCM Initial Visit)   Todd Zhang is a 73 y.o. year old male who is a primary care patient of Terald Sleeper, PA-C. The CCM team was consulted for assistance with chronic disease management and care coordination needs.   Review of patient status, including review of consultants reports, relevant laboratory and other test results, and collaboration with appropriate care team members and the patient's provider was performed as part of comprehensive patient evaluation and provision of chronic care management services.    SDOH (Social Determinants of Health) screening performed today: Housing  Food Insecurity  Physical Activity. See Care Plan for related entries.   Subjective: I spoke with Todd Zhang by telephone today. He was very pleasant and spoke freely about his medical conditions and psychosocial needs. He is currently homeless but has not gone without shelter in the recent past. He stays with various friends and has plans to move into an apartment in about 3 weeks. He is happy about that. We also discussed access to food and transportation. He has been concerned about the potential for a food shortage but he has not gone without food. He has adequate transportation locally and drives himself. He does not drive outside of the county though. He is currently managing a wound on his left toe. He was treated with IV vancomycin for osteomyelitis but that was changed to oral Levaquin and he only has a few pills left. He is scheduled for see Dr Irving Shows tomorrow.   Objective:  Lab Results  Component Value Date   HGBA1C 7.4 (H) 11/06/2018   HGBA1C 7.8 (H) 08/04/2018   HGBA1C 8.0 (H) 05/03/2018   Lab Results  Component Value Date   LDLCALC 109 (H) 11/06/2018   CREATININE 0.95 04/01/2019    Outpatient  Encounter Medications as of 05/24/2019  Medication Sig  . ACCU-CHEK AVIVA PLUS test strip TO check blood glucose twice daily  . alendronate (FOSAMAX) 70 MG tablet Take 1 tablet (70 mg total) by mouth once a week. Take with a full glass of water on an empty stomach.  . ALPRAZolam (XANAX) 0.5 MG tablet Take 1 tablet (0.5 mg total) by mouth at bedtime as needed for anxiety.  [name] atorvastatin (LIPITOR) 40 MG tablet TAKE 1 TABLET BY MOUTH EVERY DAY  . benazepril (LOTENSIN) 5 MG tablet TAKE 1 TABLET BY MOUTH DAILY  . gabapentin (NEURONTIN) 300 MG capsule Take 1 capsule (300 mg total) by mouth 3 (three) times daily. For pain  . insulin regular (HUMULIN R) 100 units/mL injection INJECT 10 UNITS INTO THE SKIN THREE TIMES DAILY BEFORE MEALS  . Insulin Syringe-Needle U-100 (GLOBAL INJECT EASE INSULIN SYR) 31G X 5/16" 0.3 ML MISC Use with insulin Dx E11.9  . metFORMIN (GLUCOPHAGE) 500 MG tablet Take 1 tablet (500 mg total) by mouth 2 (two) times daily.  . metoprolol succinate (TOPROL-XL) 50 MG 24 hr tablet Take 1 tablet (50 mg total) by mouth daily.  . Multiple Vitamin (MULTIVITAMIN) tablet Take 1 tablet by mouth daily.  [name] omeprazole (PRILOSEC) 20 MG capsule Take 1 capsule (20 mg total) by mouth daily.  [name] oxyCODONE-acetaminophen (PERCOCET) 5-325 MG tablet Take 1 tablet by mouth every 4 (four) hours as needed for severe pain. 2 of 3  . oxyCODONE-acetaminophen (PERCOCET) 5-325 MG tablet Take 1 tablet by mouth every  6 (six) hours as needed for severe pain. 3 of 3  . oxyCODONE-acetaminophen (PERCOCET) 7.5-325 MG tablet Take 1 tablet by mouth every 6 (six) hours as needed for severe pain. 1 of 3  . pyridOXINE (VITAMIN B-6) 100 MG tablet Take 100 mg by mouth daily.  . tamsulosin (FLOMAX) 0.4 MG CAPS capsule Take 1 capsule (0.4 mg total) by mouth daily.  Alveda Reasons 20 MG TABS tablet Take 1 tablet (20 mg total) by mouth daily.  . [DISCONTINUED] levofloxacin (LEVAQUIN) 500 MG tablet Take 500 mg by mouth daily.  .  [DISCONTINUED] Vancomycin 1,250 mg in sodium chloride 0.9 % 250 mL Inject 1,250 mg into the vein every 12 (twelve) hours.   No facility-administered encounter medications on file as of 05/24/2019.       Assessment and Plan Goals Addressed            This Visit's Progress     Patient Stated   . "I need stable housing" (pt-stated)       Current Barriers:  . Lacks caregiver support.  . Film/video editor.  Lauralyn Primes barriers  Nurse Case Manager Clinical Goal(s):  [name] Over the next 30 days, patient will work with CCM team to address needs related to homelessness  Interventions:  . Social Work referral for homelessness . Discussed current living arrangements with patient.  o States that he has not been without shelter any this past year o He stays with various friends and some of them have pets indoors and fleas o He has furniture and belongings in storage o He has plans to move into a ground floor apartment on 06/16/19. He will be renting from a friend that he trusts.  - Rent is not income based but he says it is reasonable and it will include all of his utilities so he only has to worry about one bill a month o Patient has a very positive attitude about his current situation o Answered that he has two children when asked, but did not discuss them. No other family was mentioned either.  Patient Self Care Activities:  . Performs ADL's independently . Performs IADL's independently  Initial goal documentation     . "I want my toe to heal up" (pt-stated)       Current Barriers:  [name] Knowledge Deficits related to diabetic foot care and wound care . 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 Over the next 30 days, patient will attend all scheduled medical  appointments: Dr Irving Shows Todd Zhang 05/26/2019  Interventions:  . Reviewed medications with patient and discussed antibiotics-currently taking Levaquin orally and had vancomycin IV discontinued . Discussed HPI o Patient reports a healing wound on left toe o Taking Levaquin orally o Seeing Dr Irving Shows regularly. Next appt is tomorrow.  o States "Dr Irving Shows wants me to have a test on my legs to see how the blood is flowing but I can't go to [facility] to have that done." - RNCM will reach out to Dr Irving Shows to discuss options - Patient is willing and able to go to [facility] . Advised to check feet daily and report any new sores, ulcerations, or worsening of current condition by calling [facility] at [phone] or me at [phone]. [name] Discussed proper foot care  Patient Self Care Activities:  .  Performs ADL's independently . Performs IADL's independently  Initial goal documentation      . "I want to make sure I have enough food" (pt-stated)       Current Barriers:  . Lacks caregiver support.  . Film/video editor.  Lauralyn Primes barriers  Nurse Case Manager Clinical Goal(s):  [name] Over the next 30 days, patient will work with CCM team to address needs related to food insecurity and acquisition.   Interventions:  . Collaborated with Theadore Nan, LCSW regarding food insecurity . Social Work referral for food insecurity . Discussed current situation with patient o He is receiving food stamps o There have been recent times that he was concerned he would run out of food before the end of the month o He has not gone without food any within the past month o He does stretch out his food stamps and manages his allotment to make sure he has enough for the month  Patient Self Care Activities:  . Performs ADL's independently . Performs IADL's independently  Initial goal documentation         Todd Zhang was given information about Chronic Care Management services today including:  1. CCM service  includes personalized support from designated clinical staff supervised by his physician, including individualized plan of care and coordination with other care providers 2. 24/7 contact phone numbers for assistance for urgent and routine care needs. 3. Service will only be billed when office clinical staff spend 20 minutes or more in a month to coordinate care. 4. Only one practitioner may furnish and bill the service in a calendar month. 5. The patient may stop CCM services at any time (effective at the end of the month) by phone call to the office staff. 6. The patient will be responsible for cost sharing (co-pay) of up to 20% of the service fee (after annual deductible is met).  Patient agreed to services and verbal consent obtained.    The care management team will reach out to the patient again over the next 14 days.   Patient will talk with LCSW regarding psychosocial issues.  Patient will reach out to the CCM as needed with any questions or concerns.   Chong Sicilian BSN, RN-BC Embedded Chronic Care Manager [facility] [place] Management Direct Dial: [phone]

## 2019-06-04 ENCOUNTER — Telehealth: Payer: Medicare Other | Admitting: *Deleted

## 2019-06-13 ENCOUNTER — Ambulatory Visit (INDEPENDENT_AMBULATORY_CARE_PROVIDER_SITE_OTHER): Payer: Medicare Other | Admitting: Licensed Clinical Social Worker

## 2019-06-13 DIAGNOSIS — Z794 Long term (current) use of insulin: Secondary | ICD-10-CM

## 2019-06-13 DIAGNOSIS — E1140 Type 2 diabetes mellitus with diabetic neuropathy, unspecified: Secondary | ICD-10-CM | POA: Diagnosis not present

## 2019-06-13 DIAGNOSIS — I10 Essential (primary) hypertension: Secondary | ICD-10-CM | POA: Diagnosis not present

## 2019-06-13 DIAGNOSIS — F4321 Adjustment disorder with depressed mood: Secondary | ICD-10-CM | POA: Diagnosis not present

## 2019-06-13 DIAGNOSIS — J441 Chronic obstructive pulmonary disease with (acute) exacerbation: Secondary | ICD-10-CM

## 2019-06-13 DIAGNOSIS — F411 Generalized anxiety disorder: Secondary | ICD-10-CM

## 2019-06-13 NOTE — Chronic Care Management (AMB) (Signed)
Care Management Note   Todd Zhang is a 73 y.o. year old male who is a primary care patient of Terald Sleeper, PA-C. The CM team was consulted for assistance with chronic disease management and care coordination.   I reached out to [name] by phone today.   [name] was given information about Chronic Care Management services today including:  1. CCM service includes personalized support from designated clinical staff supervised by his physician, including individualized plan of care and coordination with other care providers 2. 24/7 contact phone numbers for assistance for urgent and routine care needs. 3. Service will only be billed when office clinical staff spend 20 minutes or more in a month to coordinate care. 4. Only one practitioner may furnish and bill the service in a calendar month. 5. The patient may stop CCM services at any time (effective at the end of the month) by phone call to the office staff. 6. The patient will be responsible for cost sharing (co-pay) of up to 20% of the service fee (after annual deductible is met). Patient agreed to services and verbal consent obtained.    Review of patient status, including review of consultants reports, relevant laboratory and other test results, and collaboration with appropriate care team members and the patient's provider was performed as part of comprehensive patient evaluation and provision of chronic care management services.   Medications    ACCU-CHEK AVIVA PLUS test strip    alendronate (FOSAMAX) 70 MG tablet    ALPRAZolam (XANAX) 0.5 MG tablet    atorvastatin (LIPITOR) 40 MG tablet    benazepril (LOTENSIN) 5 MG tablet    gabapentin (NEURONTIN) 300 MG capsule    insulin regular (HUMULIN R) 100 units/mL injection    Insulin Syringe-Needle U-100 (GLOBAL INJECT EASE INSULIN SYR) 31G X 5/16" 0.3 ML MISC    metFORMIN (GLUCOPHAGE) 500 MG tablet    metoprolol succinate (TOPROL-XL) 50 MG 24 hr tablet    Multiple Vitamin  (MULTIVITAMIN) tablet    omeprazole (PRILOSEC) 20 MG capsule    oxyCODONE-acetaminophen (PERCOCET) 5-325 MG tablet    oxyCODONE-acetaminophen (PERCOCET) 5-325 MG tablet    oxyCODONE-acetaminophen (PERCOCET) 7.5-325 MG tablet    pyridOXINE (VITAMIN B-6) 100 MG tablet    tamsulosin (FLOMAX) 0.4 MG CAPS capsule    XARELTO 20 MG TABS tablet        Chronic Care Management from 06/13/2019 in [place]  PHQ-9 Total Score  6     GAD 7 : Generalized Anxiety Score 06/13/2019  Nervous, Anxious, on Edge 1  Control/stop worrying 1  Worry too much - different things 1  Trouble relaxing 1  Restless 1  Easily annoyed or irritable 0  Afraid - awful might happen 0  Total GAD 7 Score 5  Anxiety Difficulty Somewhat difficult    Goals Addressed            This Visit's Progress   . "I need stable housing" (pt-stated)       Current Barriers:  . Lacks caregiver support.  . Film/video editor.  Lauralyn Primes barriers  Nurse Case Manager Clinical Goal(s):  [name] Over the next 30 days, patient will work with CCM team to address needs related to homelessness  Interventions:  . LCSW talked with client about financial needs of client . LCSW talked with client about recent death of client's brother . Discussed current living arrangements with patient.  o States that he has not been without shelter any  this past year o He stays with various friends and some of them have pets indoors and fleas o He has furniture and belongings in storage o He has plans to move into a ground floor apartment on 06/16/19. He will be renting from a friend that he trusts.  - Rent is not income based but he says it is reasonable and it will include all of his utilities so he only has to worry about one bill a month   Patient Self Care Activities:  . Performs ADL's independently . Performs IADL's independently  Plan:  Client to communicate with RNCM to discuss nursing needs LCSW to call client in  next 3 weeks to talk about housing needs of client Client to attend scheduled client medical appointments.  Initial goal documentation       Follow Up Plan: LCSW to call client in next 3 weeks to talk with client about housing needs of client  Norva Zhang.Rakeisha Nyce MSW, LCSW Licensed Clinical Social Worker [facility] Care Management [phone]

## 2019-06-13 NOTE — Patient Instructions (Signed)
Licensed Clinical Social Worker Visit Information  Goals we discussed today:  Goals Addressed            This Visit's Progress   . "I need stable housing" (pt-stated)       Current Barriers:  . Lacks caregiver support.  . Financial Constraints.  . Literacy barriers  Nurse Case Manager Clinical Goal(s):  . Over the next 30 days, patient will work with CCM team to address needs related to homelessness  Interventions:  . LCSW talked with client about financial needs of client . LCSW talked with client about recent death of client's brother . Discussed current living arrangements with patient.  o States that he has not been without shelter any this past year o He stays with various friends and some of them have pets indoors and fleas o He has furniture and belongings in storage o He has plans to move into a ground floor apartment on 06/16/19. He will be renting from a friend that he trusts.  - Rent is not income based but he says it is reasonable and it will include all of his utilities so he only has to worry about one bill a month   Patient Self Care Activities:  . Performs ADL's independently . Performs IADL's independently  Plan:  Client to communicate with RNCM to discuss nursing needs LCSW to call client in next 3 weeks to talk about housing needs of client Client to attend scheduled client medical appointments.  Initial goal documentation         Materials provided: No  Todd Zhang was given information about Chronic Care Management services today including:  1. CCM service includes personalized support from designated clinical staff supervised by his physician, including individualized plan of care and coordination with other care providers 2. 24/7 contact phone numbers for assistance for urgent and routine care needs. 3. Service will only be billed when office clinical staff spend 20 minutes or more in a month to coordinate care. 4. Only one practitioner may  furnish and bill the service in a calendar month. 5. The patient may stop CCM services at any time (effective at the end of the month) by phone call to the office staff. 6. The patient will be responsible for cost sharing (co-pay) of up to 20% of the service fee (after annual deductible is met).  Patient agreed to services and verbal consent obtained.    Follow Up Plan: LCSW to call client in next 3 weeks to talk with client about housing needs of client  The patient verbalized understanding of instructions provided today and declined a print copy of patient instruction materials.   Todd Zhang MSW, LCSW Licensed Clinical Social Worker [facility] Care Management [phone]  

## 2019-06-20 ENCOUNTER — Telehealth: Payer: Medicare Other | Admitting: *Deleted

## 2019-06-22 ENCOUNTER — Telehealth: Payer: Self-pay | Admitting: Physician Assistant

## 2019-06-22 ENCOUNTER — Other Ambulatory Visit: Payer: Self-pay

## 2019-06-22 NOTE — Telephone Encounter (Signed)
Patient states he does not cough or symptoms only when something aggravates his allergies. Patient aware he can come in for appointment as long as he has none of the symptoms.

## 2019-06-25 ENCOUNTER — Ambulatory Visit: Payer: Medicare Other | Admitting: Physician Assistant

## 2019-07-03 ENCOUNTER — Ambulatory Visit (INDEPENDENT_AMBULATORY_CARE_PROVIDER_SITE_OTHER): Payer: Medicare Other | Admitting: Licensed Clinical Social Worker

## 2019-07-03 DIAGNOSIS — I482 Chronic atrial fibrillation, unspecified: Secondary | ICD-10-CM | POA: Diagnosis not present

## 2019-07-03 DIAGNOSIS — F4321 Adjustment disorder with depressed mood: Secondary | ICD-10-CM

## 2019-07-03 DIAGNOSIS — F411 Generalized anxiety disorder: Secondary | ICD-10-CM

## 2019-07-03 DIAGNOSIS — I10 Essential (primary) hypertension: Secondary | ICD-10-CM

## 2019-07-03 DIAGNOSIS — E1140 Type 2 diabetes mellitus with diabetic neuropathy, unspecified: Secondary | ICD-10-CM | POA: Diagnosis not present

## 2019-07-03 DIAGNOSIS — J441 Chronic obstructive pulmonary disease with (acute) exacerbation: Secondary | ICD-10-CM | POA: Diagnosis not present

## 2019-07-03 DIAGNOSIS — Z794 Long term (current) use of insulin: Secondary | ICD-10-CM

## 2019-07-03 NOTE — Patient Instructions (Addendum)
Licensed Clinical Social Worker Visit Information  Goals we discussed today:  Goals    . "I need stable housing" (pt-stated)     Current Barriers:  . Lacks caregiver support.  . Film/video editor.  Lauralyn Primes barriers  Nurse Case Manager Clinical Goal(s):  [name] Over the next 30 days, patient will work with CCM team to address needs related to homelessness  Interventions:    LCSW talked with Kenyon Ana about current housing needs (he is still looking for a place to live)  Discussed current living arrangements with patient.  ? States that he has not been without shelter any this past year ? He stays with various friends and some of them have pets indoors and fleas ? He has furniture and belongings in storage ? LCSW talked with Kenyon Ana about financial needs of client.(client receives Food Stamps benefit each month) ? 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 Encouraged client to call [facility] apartments in [place] to inquire about apartment availability (client has Medicaid)   Patient Self Care Activities:  . Performs ADL's independently . Performs IADL's independently  Plan:  Client to communicate with RNCM to discuss nursing needs LCSW to call client in next 3 weeks to talk about housing needs of client Client to attend scheduled client medical appointments.  Initial goal documentation       Materials Provided: No  Follow Up Plan: LCSW to call client in next 3 weeks to talk with client about the housing needs of client  The patient verbalized understanding of instructions provided today and declined a print copy of patient instruction materials.   [name].Nikkolas Coomes MSW, LCSW Licensed Clinical Social Worker [facility] Care Management [phone]

## 2019-07-03 NOTE — Chronic Care Management (AMB) (Signed)
Care Management Note   Todd Zhang is a 73 y.o. year old male who is a primary care patient of Remus Loffler, PA-C. The CM team was consulted for assistance with chronic disease management and care coordination.   I reached out to Todd Zhang by phone today.   Review of patient status, including review of consultants reports, relevant laboratory and other test results, and collaboration with appropriate care team members and the patient's provider was performed as part of comprehensive patient evaluation and provision of chronic care management services.   Social Determinants of Health: risk of social isolation; risk of tobacco use; risk of depression ; risk of food insecurity; risk of housing needs    Chronic Care Management from 06/13/2019 in [facility]  PHQ-9 Total Score  6     GAD 7 : Generalized Anxiety Score 06/13/2019  Nervous, Anxious, on Edge 1  Control/stop worrying 1  Worry too much - different things 1  Trouble relaxing 1  Restless 1  Easily annoyed or irritable 0  Afraid - awful might happen 0  Total GAD 7 Score 5  Anxiety Difficulty Somewhat difficult   Medications   New medications from outside sources are available for reconciliation   ACCU-CHEK AVIVA PLUS test strip    alendronate (FOSAMAX) 70 MG tablet    ALPRAZolam (XANAX) 0.5 MG tablet    atorvastatin (LIPITOR) 40 MG tablet    benazepril (LOTENSIN) 5 MG tablet    gabapentin (NEURONTIN) 300 MG capsule    insulin regular (HUMULIN R) 100 units/mL injection    Insulin Syringe-Needle U-100 (GLOBAL INJECT EASE INSULIN SYR) 31G X 5/16" 0.3 ML MISC    metFORMIN (GLUCOPHAGE) 500 MG tablet    metoprolol succinate (TOPROL-XL) 50 MG 24 hr tablet    Multiple Vitamin (MULTIVITAMIN) tablet    omeprazole (PRILOSEC) 20 MG capsule    oxyCODONE-acetaminophen (PERCOCET) 5-325 MG tablet    oxyCODONE-acetaminophen (PERCOCET) 5-325 MG tablet    oxyCODONE-acetaminophen (PERCOCET) 7.5-325 MG tablet      pyridOXINE (VITAMIN B-6) 100 MG tablet    tamsulosin (FLOMAX) 0.4 MG CAPS capsule    XARELTO 20 MG TABS tablet     Goals    . "I need stable housing" (pt-stated)     Current Barriers:  . Lacks caregiver support.  . Corporate treasurer.  [name] barriers  Nurse Case Manager Clinical Goal(s):  [name] Over the next 30 days, patient will work with CCM team to address needs related to homelessness  Interventions:  . LCSW talked with Todd Zhang about current housing needs (he is still looking for a place to live) . Discussed current living arrangements with patient.  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 Performs ADL's independently . Performs IADL's independently  Plan:  Client to communicate with RNCM to discuss nursing needs LCSW to call client in next 3 weeks to talk about housing needs of client Client to attend scheduled client medical appointments.  Initial goal documentation       Follow Up Plan: LCSW to call client in next 3 weeks to talk with client about the housing needs of client.  [name].Aurielle Slingerland MSW, LCSW Licensed Clinical Social Worker [facility] [phone]

## 2019-07-19 ENCOUNTER — Other Ambulatory Visit: Payer: Self-pay | Admitting: Physician Assistant

## 2019-07-19 DIAGNOSIS — F411 Generalized anxiety disorder: Secondary | ICD-10-CM

## 2019-07-24 ENCOUNTER — Ambulatory Visit (INDEPENDENT_AMBULATORY_CARE_PROVIDER_SITE_OTHER): Payer: Medicare Other | Admitting: Licensed Clinical Social Worker

## 2019-07-24 ENCOUNTER — Ambulatory Visit: Payer: Medicare Other | Admitting: *Deleted

## 2019-07-24 DIAGNOSIS — J441 Chronic obstructive pulmonary disease with (acute) exacerbation: Secondary | ICD-10-CM

## 2019-07-24 DIAGNOSIS — F411 Generalized anxiety disorder: Secondary | ICD-10-CM

## 2019-07-24 DIAGNOSIS — I482 Chronic atrial fibrillation, unspecified: Secondary | ICD-10-CM | POA: Diagnosis not present

## 2019-07-24 DIAGNOSIS — E1140 Type 2 diabetes mellitus with diabetic neuropathy, unspecified: Secondary | ICD-10-CM | POA: Diagnosis not present

## 2019-07-24 DIAGNOSIS — Z794 Long term (current) use of insulin: Secondary | ICD-10-CM

## 2019-07-24 DIAGNOSIS — M21962 Unspecified acquired deformity of left lower leg: Secondary | ICD-10-CM

## 2019-07-24 DIAGNOSIS — F4321 Adjustment disorder with depressed mood: Secondary | ICD-10-CM

## 2019-07-24 DIAGNOSIS — I10 Essential (primary) hypertension: Secondary | ICD-10-CM

## 2019-07-24 NOTE — Patient Instructions (Signed)
  Plan RN CCM will attempt to reach patient by telephone again over the next 2 days.     Chong Sicilian, BSN, RN-BC Embedded Chronic Care Manager [facility] / [place] [facility] Direct Dial: [phone]

## 2019-07-24 NOTE — Patient Instructions (Addendum)
Licensed Clinical Social Worker Visit Information  Goals we discussed today:  Goals    . "I need stable housing" (pt-stated)     Current Barriers:  . Lacks caregiver support.  . Film/video editor.  Lauralyn Primes barriers  Nurse Case Manager Clinical Goal(s):  [name] Over the next 30 days, patient will work with CCM team to address needs related to homelessness  Interventions:    LCSW talked with Kenyon Ana about current housing needs (he is still looking for a place to live)  Discussed current living arrangements with patient.  ? States that he has not been without shelter any this past year ? He stays with various friends and some of them have pets indoors and fleas ? He has furniture and belongings in storage ? LCSW talked with Kenyon Ana about financial needs of client.(client receives Food Stamps benefit each month) ? 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 Encouraged client to call [facility] apartments in [place] to inquire about apartment availability (client has Medicaid) ? Talked with client about pain issues of client ? Collaborated with RNCM about nursing needs of client  Patient Self Care Activities:  . Performs ADL's independently . Performs IADL's independently  Plan:  Client to communicate with RNCM to discuss nursing needs LCSW to call client in next 3 weeks to talk about housing needs of client Client to attend scheduled client medical appointments.  Initial goal documentation           Materials Provided: No  Follow Up Plan: LCSW to call client in next 3 weeks to talk with client about the housing needs of client  Patient verbalized understanding of patient instructions today and patient declined a print copy of patient instructions  [name].Lativia Velie MSW, LCSW Licensed Clinical Social Worker [facility] [phone]

## 2019-07-24 NOTE — Chronic Care Management (AMB) (Signed)
  Chronic Care Management   Follow Up Note   07/24/2019 Name: Todd Zhang MRN: [id number] DOB: 09/16/1945  Referred by: Terald Sleeper, PA-C Reason for referral : Chronic Care Management (RN follow-up)  I reached out to Todd Zhang by telephone today as a CCM follow-up after being consulted by Theadore Nan, LCSW who spoke with him earlier in the day and was concerned about swelling that Todd Zhang reported in his lower extremity.   Todd Zhang sees Dr Irving Shows, podiatrist, for diabetic foot care and is scheduled to see him this week and has a history of lower extremity edema. He was to have vascular studies ordered by Dr Irving Shows, but I am uncertain if these have been done. Chart review shows that Todd Zhang is not prescribed a diuretic.   I was unable to reach Todd Zhang by phone and his voicemail is not setup.   Plan RN CCM will attempt to reach patient by telephone again over the next 2 days.     Chong Sicilian, BSN, RN-BC Embedded Chronic Care Manager [facility] / [place] [facility] Direct Dial: [phone]

## 2019-07-24 NOTE — Chronic Care Management (AMB) (Signed)
Care Management Note   Todd Zhang is a 73 y.o. year old male who is a primary care patient of Remus Loffler, PA-C. The CM team was consulted for assistance with chronic disease management and care coordination.   I reached out to [name] by phone today.    Review of patient status, including review of consultants reports, relevant laboratory and other test results, and collaboration with appropriate care team members and the patient's provider was performed as part of comprehensive patient evaluation and provision of chronic care management services.   Social Determinants of health: risk of social isolation; risk of tobacco use; risk of stress; risk of food insecurity; risk of housing needs    Chronic Care Management from 06/13/2019 in [facility]  PHQ-9 Total Score  6     GAD 7 : Generalized Anxiety Score 06/13/2019  Nervous, Anxious, on Edge 1  Control/stop worrying 1  Worry too much - different things 1  Trouble relaxing 1  Restless 1  Easily annoyed or irritable 0  Afraid - awful might happen 0  Total GAD 7 Score 5  Anxiety Difficulty Somewhat difficult   Medications   New medications from outside sources are available for reconciliation   ACCU-CHEK AVIVA PLUS test strip    alendronate (FOSAMAX) 70 MG tablet    ALPRAZolam (XANAX) 0.5 MG tablet    atorvastatin (LIPITOR) 40 MG tablet    benazepril (LOTENSIN) 5 MG tablet    gabapentin (NEURONTIN) 300 MG capsule    insulin regular (HUMULIN R) 100 units/mL injection    Insulin Syringe-Needle U-100 (GLOBAL INJECT EASE INSULIN SYR) 31G X 5/16" 0.3 ML MISC    metFORMIN (GLUCOPHAGE) 500 MG tablet    metoprolol succinate (TOPROL-XL) 50 MG 24 hr tablet    Multiple Vitamin (MULTIVITAMIN) tablet    omeprazole (PRILOSEC) 20 MG capsule    oxyCODONE-acetaminophen (PERCOCET) 5-325 MG tablet    oxyCODONE-acetaminophen (PERCOCET) 5-325 MG tablet    oxyCODONE-acetaminophen (PERCOCET) 7.5-325 MG tablet    pyridOXINE (VITAMIN B-6) 100 MG tablet    tamsulosin (FLOMAX) 0.4 MG CAPS capsule    XARELTO 20 MG TABS tablet    Goals    . "I need stable housing" (pt-stated)     Current Barriers:  . Lacks caregiver support.  . Corporate treasurer.  [name] barriers  Nurse Case Manager Clinical Goal(s):  [name] Over the next 30 days, patient will work with CCM team to address needs related to homelessness  Interventions:    LCSW talked with [name] about current housing needs (he is still looking for a place to live)  Discussed current living arrangements with patient.  ? States that he has not been without shelter any this past year ? He stays with various friends and some of them have pets indoors and fleas ? He has furniture and belongings in storage ? LCSW talked with [name] about financial needs of client.(client receives Food Stamps benefit each month) ? 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 Encouraged client to call [facility] apartments in [place] to inquire about apartment availability (client has Medicaid) ? Talked with client about pain issues of client ? Collaborated with RNCM about nursing needs of client   Patient Self Care Activities:  . Performs ADL's independently . Performs IADL's independently  Plan:  Client to communicate with RNCM to discuss nursing needs LCSW to call client in next  3 weeks to talk about housing needs of client Client to attend scheduled client medical appointments.  Initial goal documentation      Follow Up Plan:  LCSW to call client in next 3 weeks to talk with client about the housing needs of client  [name].[name] MSW, LCSW Licensed Clinical Social Worker [facility] [phone]

## 2019-08-01 ENCOUNTER — Other Ambulatory Visit: Payer: Self-pay | Admitting: Physician Assistant

## 2019-08-01 DIAGNOSIS — F411 Generalized anxiety disorder: Secondary | ICD-10-CM

## 2019-08-14 ENCOUNTER — Ambulatory Visit: Payer: Self-pay | Admitting: *Deleted

## 2019-08-14 ENCOUNTER — Ambulatory Visit: Payer: Self-pay | Admitting: Licensed Clinical Social Worker

## 2019-08-14 DIAGNOSIS — J441 Chronic obstructive pulmonary disease with (acute) exacerbation: Secondary | ICD-10-CM

## 2019-08-14 DIAGNOSIS — E1140 Type 2 diabetes mellitus with diabetic neuropathy, unspecified: Secondary | ICD-10-CM

## 2019-08-14 DIAGNOSIS — F4321 Adjustment disorder with depressed mood: Secondary | ICD-10-CM

## 2019-08-14 DIAGNOSIS — F411 Generalized anxiety disorder: Secondary | ICD-10-CM

## 2019-08-14 DIAGNOSIS — I10 Essential (primary) hypertension: Secondary | ICD-10-CM

## 2019-08-14 DIAGNOSIS — I4891 Unspecified atrial fibrillation: Secondary | ICD-10-CM

## 2019-08-14 DIAGNOSIS — I482 Chronic atrial fibrillation, unspecified: Secondary | ICD-10-CM

## 2019-08-14 LAB — HM DIABETES EYE EXAM

## 2019-08-14 NOTE — Patient Instructions (Addendum)
Licensed Clinical Social Worker Visit Information  Goals we discussed today:  Goals    . "I need stable housing" (pt-stated)     Current Barriers:  . Lacks caregiver support.  . Film/video editor.  Lauralyn Primes barriers  Nurse Case Manager Clinical Goal(s):  [name] Over the next 30 days, patient will work with CCM team to address needs related to homelessness  Interventions:    LCSW discussed with client the  current living arrangements of client  ? States that he has not been without shelter any this past year ? He stays with various friends and some of them have pets indoors and fleas ? He has furniture and belongings in storage ? LCSW talked with Kenyon Ana about financial needs of client.(client receives Food Stamps benefit each month) ? Talked with client about medication needs of client ? Encouraged client to call [facility] apartments in [place] to inquire about apartment availability (client has Medicaid) ? Talked with client previously about pain issues of client ? Collaborated with RNCM regarding medication needs of client (client said he was out of his blood thinner medication)  Patient Self Care Activities:  . Performs ADL's independently . Performs IADL's independently  Plan:  Client to communicate with RNCM to discuss nursing needs LCSW to call client in next 3 weeks to talk about housing needs of client Client to attend scheduled client medical appointments.  Initial goal documentation         Materials Provided: No  Follow Up Plan: LCSW to call client in next 3 weeks to talk with client about the housing  needs of client at that time  The patient verbalized understanding of instructions provided today and declined a print copy of patient instruction materials.   [name].Ashyah Quizon MSW, LCSW Licensed Clinical Social Worker [facility] [phone]

## 2019-08-14 NOTE — Chronic Care Management (AMB) (Signed)
  Chronic Care Management   Care Coordination Note  08/14/2019 Name: JAQUIN COY MRN: [id number] DOB: 03/13/1946  I was consulted by Theadore Nan, LCSW regarding Mr Culton medications after he spoke with him by telephone.  RN Care Plan   . Manage Medication Refills       Current Barriers:  [name] Knowledge Deficits related to process for obtaining refills . Difficulty obtaining medications  Nurse Case Manager Clinical Goal(s):  [name] Over the next 5 days, patient will work with PCP office to obtain refill of Xarelto . Over the next 5 days, patient will talk with PCP clinical staff to schedule a follow-up appointment with Particia Nearing, PA-C for general follow-up and to refill Xanax  Interventions:  . RN was consulted by Theadore Nan, LCSW after he spoke with patient by telephone today o Patient relayed that he is out of Xarelto and Xanax . Chart reviewed o It doesn't appear that the patient's pharmacy has requested a refill . Collaboration with PCP clinical staff regarding refill of Xarelto and scheduling of f/u appointment o Patient should expect a call to schedule an appointment and the Xarelto should be refilled unless contraindicated   Patient Self Care Activities:  . Performs ADL's independently . Performs IADL's independently   Initial goal documentation         Follow up plan: The care management team will reach out to the patient again over the next 30 days.   Chong Sicilian, BSN, RN-BC Embedded Chronic Care Manager [facility] / [place] [facility] Direct Dial: [phone]

## 2019-08-14 NOTE — Patient Instructions (Signed)
RN Care Plan   . Manage Medication Refills       Current Barriers:  [name] Knowledge Deficits related to process for obtaining refills . Difficulty obtaining medications  Nurse Case Manager Clinical Goal(s):  [name] Over the next 5 days, patient will work with PCP office to obtain refill of Xarelto . Over the next 5 days, patient will talk with PCP clinical staff to schedule a follow-up appointment with Particia Nearing, PA-C for general follow-up and to refill Xanax  Interventions:  . RN was consulted by Theadore Nan, LCSW after he spoke with patient by telephone today o Patient relayed that he is out of Xarelto and Xanax . Chart reviewed o It doesn't appear that the patient's pharmacy has requested a refill . Collaboration with PCP clinical staff regarding refill of Xarelto and scheduling of f/u appointment o Patient should expect a call to schedule an appointment and the Xarelto should be refilled unless contraindicated   Patient Self Care Activities:  . Performs ADL's independently . Performs IADL's independently   Initial goal documentation         Follow up plan: The care management team will reach out to the patient again over the next 30 days.   Chong Sicilian, BSN, RN-BC Embedded Chronic Care Manager [facility] / [place] [facility] Direct Dial: [phone]

## 2019-08-14 NOTE — Chronic Care Management (AMB) (Signed)
Care Management Note   Todd Zhang is a 73 y.o. year old male who is a primary care patient of Remus Loffler, PA-C. The CM team was consulted for assistance with chronic disease management and care coordination.   I reached out to [name] by phone today.    Review of patient status, including review of consultants reports, relevant laboratory and other test results, and collaboration with appropriate care team members and the patient's provider was performed as part of comprehensive patient evaluation and provision of chronic care management services.   Social determinants of health; risk of social isolation; risk of tobacco use; risk of food insecurity; risk of housing needs; risk of physical inactivity    Chronic Care Management from 06/13/2019 in [facility]  PHQ-9 Total Score  6     GAD 7 : Generalized Anxiety Score 06/13/2019  Nervous, Anxious, on Edge 1  Control/stop worrying 1  Worry too much - different things 1  Trouble relaxing 1  Restless 1  Easily annoyed or irritable 0  Afraid - awful might happen 0  Total GAD 7 Score 5  Anxiety Difficulty Somewhat difficult   Medications   New medications from outside sources are available for reconciliation   ACCU-CHEK AVIVA PLUS test strip    alendronate (FOSAMAX) 70 MG tablet    ALPRAZolam (XANAX) 0.5 MG tablet    atorvastatin (LIPITOR) 40 MG tablet    benazepril (LOTENSIN) 5 MG tablet    gabapentin (NEURONTIN) 300 MG capsule    insulin regular (HUMULIN R) 100 units/mL injection    Insulin Syringe-Needle U-100 (GLOBAL INJECT EASE INSULIN SYR) 31G X 5/16" 0.3 ML MISC    metFORMIN (GLUCOPHAGE) 500 MG tablet    metoprolol succinate (TOPROL-XL) 50 MG 24 hr tablet    Multiple Vitamin (MULTIVITAMIN) tablet    omeprazole (PRILOSEC) 20 MG capsule    oxyCODONE-acetaminophen (PERCOCET) 5-325 MG tablet    oxyCODONE-acetaminophen (PERCOCET) 5-325 MG tablet    oxyCODONE-acetaminophen (PERCOCET) 7.5-325 MG  tablet    pyridOXINE (VITAMIN B-6) 100 MG tablet    tamsulosin (FLOMAX) 0.4 MG CAPS capsule    XARELTO 20 MG TABS tablet    Goals    . "I need stable housing" (pt-stated)     Current Barriers:  . Lacks caregiver support.  . Corporate treasurer.  [name] barriers  Nurse Case Manager Clinical Goal(s):  [name] Over the next 30 days, patient will work with CCM team to address needs related to homelessness  Interventions:  . LCSW discussed with client the  current living arrangements of client  o States that he has not been without shelter any this past year o He stays with various friends and some of them have pets indoors and fleas o He has furniture and belongings in storage ? LCSW talked with [name] about financial needs of client.(client receives Food Stamps benefit each month) ? Talked with client about medication needs of client ? Encouraged client to call [facility] apartments in [place] to inquire about apartment availability (client has Medicaid) ? Talked with client previously about pain issues of client ? Collaborated with RNCM regarding medication needs of client (client said he was out of his blood thinner medication)  Patient Self Care Activities:  . Performs ADL's independently . Performs IADL's independently  Plan:  Client to communicate with RNCM to discuss nursing needs LCSW to call client in next 3 weeks to talk about housing needs of client Client to attend  scheduled client medical appointments.  Initial goal documentation       Follow Up Plan: LCSW to call client in next 3 weeks to talk with client about housing needs of client at that time  [name].Zanaria Morell MSW, LCSW Licensed Clinical Social Worker [facility] [phone]

## 2019-08-15 ENCOUNTER — Telehealth: Payer: Self-pay | Admitting: Family Medicine

## 2019-08-15 ENCOUNTER — Other Ambulatory Visit: Payer: Self-pay | Admitting: Family Medicine

## 2019-08-15 DIAGNOSIS — I482 Chronic atrial fibrillation, unspecified: Secondary | ICD-10-CM

## 2019-08-15 MED ORDER — XARELTO 20 MG PO TABS: 20.0000 mg | ORAL_TABLET | Freq: Every day | ORAL | 5 refills | Status: DC

## 2019-08-15 NOTE — Telephone Encounter (Signed)
-----   Message from Ilean China, RN sent at 08/14/2019 11:39 PM EST ----- Regarding: Needs Appt and Xarelto Refill Patient talked with Nicki Reaper on 08/14/2019 and relayed that he has been out of his Xarelto for about a week. He made it sound like the pharmacy has requested a refill, but I don't see any incoming requests from the pharmacy.  Can someone refill his Xarelto and call to schedule him for a f/u appt with Glenard Haring? He'll need xanax refilled at that visit.   Thank you! Cyril Mourning

## 2019-08-15 NOTE — Telephone Encounter (Signed)
Medication sent in will call patient to make an appointment.

## 2019-08-22 ENCOUNTER — Other Ambulatory Visit: Payer: Self-pay | Admitting: Physician Assistant

## 2019-08-22 DIAGNOSIS — E1140 Type 2 diabetes mellitus with diabetic neuropathy, unspecified: Secondary | ICD-10-CM

## 2019-08-22 DIAGNOSIS — Z794 Long term (current) use of insulin: Secondary | ICD-10-CM

## 2019-08-23 ENCOUNTER — Other Ambulatory Visit: Payer: Self-pay

## 2019-08-24 ENCOUNTER — Ambulatory Visit (INDEPENDENT_AMBULATORY_CARE_PROVIDER_SITE_OTHER): Payer: Medicare Other | Admitting: Physician Assistant

## 2019-08-24 ENCOUNTER — Encounter: Payer: Self-pay | Admitting: Physician Assistant

## 2019-08-24 VITALS — BP 106/61 | HR 90 | Temp 98.9°F | Resp 20 | Ht 72.0 in | Wt 210.0 lb

## 2019-08-24 DIAGNOSIS — N401 Enlarged prostate with lower urinary tract symptoms: Secondary | ICD-10-CM

## 2019-08-24 DIAGNOSIS — F411 Generalized anxiety disorder: Secondary | ICD-10-CM | POA: Diagnosis not present

## 2019-08-24 DIAGNOSIS — G8929 Other chronic pain: Secondary | ICD-10-CM

## 2019-08-24 DIAGNOSIS — Z0000 Encounter for general adult medical examination without abnormal findings: Secondary | ICD-10-CM

## 2019-08-24 DIAGNOSIS — M5030 Other cervical disc degeneration, unspecified cervical region: Secondary | ICD-10-CM | POA: Diagnosis not present

## 2019-08-24 DIAGNOSIS — M545 Low back pain, unspecified: Secondary | ICD-10-CM

## 2019-08-24 DIAGNOSIS — R3914 Feeling of incomplete bladder emptying: Secondary | ICD-10-CM

## 2019-08-24 DIAGNOSIS — E1140 Type 2 diabetes mellitus with diabetic neuropathy, unspecified: Secondary | ICD-10-CM

## 2019-08-24 DIAGNOSIS — I10 Essential (primary) hypertension: Secondary | ICD-10-CM

## 2019-08-24 DIAGNOSIS — M4802 Spinal stenosis, cervical region: Secondary | ICD-10-CM | POA: Diagnosis not present

## 2019-08-24 DIAGNOSIS — Z794 Long term (current) use of insulin: Secondary | ICD-10-CM

## 2019-08-24 LAB — BAYER DCA HB A1C WAIVED: HB A1C (BAYER DCA - WAIVED): 6.1 % (ref <7.0)

## 2019-08-24 MED ORDER — OXYCODONE-ACETAMINOPHEN 5-325 MG PO TABS: 1.0000 | ORAL_TABLET | Freq: Four times a day (QID) | ORAL | 0 refills | Status: DC | PRN

## 2019-08-24 MED ORDER — ALPRAZOLAM 0.5 MG PO TABS: 0.5000 mg | ORAL_TABLET | Freq: Every evening | ORAL | 2 refills | Status: DC | PRN

## 2019-08-25 LAB — CBC WITH DIFFERENTIAL/PLATELET
Basophils Absolute: 0 10*3/uL (ref 0.0–0.2)
Basos: 0 %
EOS (ABSOLUTE): 0.1 10*3/uL (ref 0.0–0.4)
Eos: 1 %
Hematocrit: 39.7 % (ref 37.5–51.0)
Hemoglobin: 13.5 g/dL (ref 13.0–17.7)
Immature Grans (Abs): 0 10*3/uL (ref 0.0–0.1)
Immature Granulocytes: 0 %
Lymphocytes Absolute: 2.8 10*3/uL (ref 0.7–3.1)
Lymphs: 34 %
MCH: 30.8 pg (ref 26.6–33.0)
MCHC: 34 g/dL (ref 31.5–35.7)
MCV: 90 fL (ref 79–97)
Monocytes Absolute: 0.7 10*3/uL (ref 0.1–0.9)
Monocytes: 8 %
Neutrophils Absolute: 4.6 10*3/uL (ref 1.4–7.0)
Neutrophils: 57 %
Platelets: 189 10*3/uL (ref 150–450)
RBC: 4.39 x10E6/uL (ref 4.14–5.80)
RDW: 13.2 % (ref 11.6–15.4)
WBC: 8.3 10*3/uL (ref 3.4–10.8)

## 2019-08-25 LAB — CMP14+EGFR
ALT: 11 IU/L (ref 0–44)
AST: 14 IU/L (ref 0–40)
Albumin/Globulin Ratio: 1.5 (ref 1.2–2.2)
Albumin: 4 g/dL (ref 3.7–4.7)
Alkaline Phosphatase: 82 IU/L (ref 39–117)
BUN/Creatinine Ratio: 15 (ref 10–24)
BUN: 19 mg/dL (ref 8–27)
Bilirubin Total: 0.6 mg/dL (ref 0.0–1.2)
CO2: 24 mmol/L (ref 20–29)
Calcium: 9.4 mg/dL (ref 8.6–10.2)
Chloride: 101 mmol/L (ref 96–106)
Creatinine, Ser: 1.27 mg/dL (ref 0.76–1.27)
GFR calc Af Amer: 64 mL/min/{1.73_m2} (ref >59)
GFR calc non Af Amer: 55 mL/min/{1.73_m2} — ABNORMAL LOW (ref >59)
Globulin, Total: 2.7 g/dL (ref 1.5–4.5)
Glucose: 192 mg/dL — ABNORMAL HIGH (ref 65–99)
Potassium: 4.8 mmol/L (ref 3.5–5.2)
Sodium: 137 mmol/L (ref 134–144)
Total Protein: 6.7 g/dL (ref 6.0–8.5)

## 2019-08-25 LAB — TSH: TSH: 2.11 u[IU]/mL (ref 0.450–4.500)

## 2019-08-25 LAB — MICROALBUMIN / CREATININE URINE RATIO
Creatinine, Urine: 168.1 mg/dL
Microalb/Creat Ratio: 20 mg/g creat (ref 0–29)
Microalbumin, Urine: 34.4 ug/mL

## 2019-08-25 LAB — LIPID PANEL
Chol/HDL Ratio: 4.8 ratio (ref 0.0–5.0)
Cholesterol, Total: 193 mg/dL (ref 100–199)
HDL: 40 mg/dL (ref >39)
LDL Chol Calc (NIH): 97 mg/dL (ref 0–99)
Triglycerides: 336 mg/dL — ABNORMAL HIGH (ref 0–149)
VLDL Cholesterol Cal: 56 mg/dL — ABNORMAL HIGH (ref 5–40)

## 2019-08-25 LAB — PSA: Prostate Specific Ag, Serum: 0.5 ng/mL (ref 0.0–4.0)

## 2019-08-28 LAB — TOXASSURE SELECT 13 (MW), URINE

## 2019-08-29 ENCOUNTER — Encounter: Payer: Self-pay | Admitting: Physician Assistant

## 2019-08-29 NOTE — Progress Notes (Signed)
BP 106/61   Pulse 90   Temp 98.9 F (37.2 C)   Resp 20   Ht 6' (1.829 m)   Wt 210 lb (95.3 kg)   SpO2 99%   BMI 28.48 kg/m    Subjective:    Patient ID: Todd Zhang, male    DOB: 19-Feb-1946, 73 y.o.   MRN: [id number]  HPI: Todd Zhang is a 73 y.o. male presenting on 08/24/2019 for Medical Management of Chronic Issues and Medication Refill  We have had a long discussion concerning the patient's pain medication need and our office policy.  I need to refer him to a pain clinic.  He does not feel that he can go any lower.  And he does have multiple causes for his chronic pain.  He agrees that we will make a referral plan at this time.  PAIN ASSESSMENT: Cause of pain-degenerative disc disease cervical spine, spinal stenosis, degenerative disc disease lumbar spine Radiology:Broad-based posterior disk protrusions and bony ridging at C4-5 and C6-7. Encroachment on the lateral recesses and foramina. Foraminal stenosis on the left at C5-6.  Current medications-Percocet 5 mg 1 4 times a day Still  Increasing Gabapentin to 300 TID (had been QD) Medication side effects-none Any concerns-working on titrating down  Pain on scale of 1-10-8 Frequency-Daily What increases pain-standing and walking What makes pain Better-rest, medication Effects on ADL -moderate some days Any change in general medical condition-no  Effectiveness of current meds-good Adverse reactions form pain meds-no PMP AWARE website reviewed:Yes Any suspicious activity on PMP Aware:No MME daily dose:30 (prior 67) LME daily dose:1  Contract on file12/07/2019 Last UDS2/24/20, normal  Having a recheck in 4 weeks.  Is to continue to follow-up on his pain and anxiety medicine.  Our next goal is to start titrating his alprazolam to as low a dose as possible.  Also has chronic conditions of hypertension, diabetes.  We will update his prescriptions and have labs performed today.  [facility] script sent  or current Past Medical History:  Diagnosis Date  . Atrial fibrillation ([place])   . Cervical spinal stenosis   . DDD (degenerative disc disease), cervical   . DDD (degenerative disc disease), lumbar   . Diabetes mellitus without complication ([place])   . Hyperlipidemia   . Hypertension    Relevant past medical, surgical, family and social history reviewed and updated as indicated. Interim medical history since our last visit reviewed. Allergies and medications reviewed and updated. DATA REVIEWED: CHART IN EPIC  Family History reviewed for pertinent findings.  Review of Systems  Constitutional: Negative.  Negative for appetite change, fatigue, fever and unexpected weight change.  Eyes: Negative for pain and visual disturbance.  Respiratory: Negative.  Negative for cough, chest tightness, shortness of breath and wheezing.   Cardiovascular: Negative.  Negative for chest pain, palpitations and leg swelling.  Gastrointestinal: Negative.  Negative for abdominal pain, diarrhea, nausea and vomiting.  Genitourinary: Negative.   Musculoskeletal: Positive for arthralgias, back pain and myalgias.  Skin: Negative.  Negative for color change and rash.  Neurological: Negative.  Negative for weakness, numbness and headaches.  Psychiatric/Behavioral: Negative.     Allergies as of 08/24/2019      Reactions   Asa [aspirin] Hives   Penicillins Hives      Medication List       Accurate as of August 24, 2019 11:59 PM. If you have any questions, ask your nurse or doctor.        STOP taking these  medications   oxyCODONE-acetaminophen 7.5-325 MG tablet Commonly known as: PERCOCET Replaced by: oxyCODONE-acetaminophen 5-325 MG tablet You also have another medication with the same name that you need to continue taking as instructed. Stopped by: Terald Sleeper, PA-C   pyridOXINE 100 MG tablet Commonly known as: VITAMIN B-6 Stopped by: Terald Sleeper, PA-C     TAKE these medications   Accu-Chek  Aviva Plus test strip Generic drug: glucose blood TO check blood glucose twice daily   alendronate 70 MG tablet Commonly known as: FOSAMAX Take 1 tablet (70 mg total) by mouth once a week. Take with a full glass of water on an empty stomach.   ALPRAZolam 0.5 MG tablet Commonly known as: XANAX Take 1 tablet (0.5 mg total) by mouth at bedtime as needed for anxiety.   atorvastatin 40 MG tablet Commonly known as: LIPITOR TAKE 1 TABLET BY MOUTH EVERY DAY   benazepril 5 MG tablet Commonly known as: LOTENSIN TAKE 1 TABLET BY MOUTH DAILY   gabapentin 300 MG capsule Commonly known as: NEURONTIN Take 1 capsule (300 mg total) by mouth 3 (three) times daily. For pain   insulin regular 100 units/mL injection Commonly known as: HumuLIN R INJECT 10 UNITS INTO THE SKIN THREE TIMES DAILY BEFORE MEALS.  Needs to be seen for future refills.   Insulin Syringe-Needle U-100 31G X 5/16" 0.3 ML Misc Commonly known as: Global Inject Ease Insulin Syr Use with insulin Dx E11.9   metFORMIN 500 MG tablet Commonly known as: GLUCOPHAGE Take 1 tablet (500 mg total) by mouth 2 (two) times daily.   metoprolol succinate 50 MG 24 hr tablet Commonly known as: TOPROL-XL Take 1 tablet (50 mg total) by mouth daily.   multivitamin tablet Take 1 tablet by mouth daily.   omeprazole 20 MG capsule Commonly known as: PRILOSEC Take 1 capsule (20 mg total) by mouth daily.   oxyCODONE-acetaminophen 5-325 MG tablet Commonly known as: Percocet Take 1 tablet by mouth every 4 (four) hours as needed for severe pain. 2 of 3 What changed:   Another medication with the same name was added. Make sure you understand how and when to take each.  Another medication with the same name was removed. Continue taking this medication, and follow the directions you see here. Changed by: Terald Sleeper, PA-C   oxyCODONE-acetaminophen 5-325 MG tablet Commonly known as: Percocet Take 1 tablet by mouth every 6 (six) hours as  needed for severe pain. 3 of 3 What changed:   Another medication with the same name was added. Make sure you understand how and when to take each.  Another medication with the same name was removed. Continue taking this medication, and follow the directions you see here. Changed by: Terald Sleeper, PA-C   oxyCODONE-acetaminophen 5-325 MG tablet Commonly known as: PERCOCET/ROXICET Take 1 tablet by mouth every 6 (six) hours as needed for severe pain. What changed: You were already taking a medication with the same name, and this prescription was added. Make sure you understand how and when to take each. Replaces: oxyCODONE-acetaminophen 7.5-325 MG tablet Changed by: Terald Sleeper, PA-C   tamsulosin 0.4 MG Caps capsule Commonly known as: FLOMAX Take 1 capsule (0.4 mg total) by mouth daily.   vitamin B-12 500 MCG tablet Commonly known as: CYANOCOBALAMIN Take 500 mcg by mouth daily.   Xarelto 20 MG Tabs tablet Generic drug: rivaroxaban Take 1 tablet (20 mg total) by mouth daily.  Objective:    BP 106/61   Pulse 90   Temp 98.9 F (37.2 C)   Resp 20   Ht 6' (1.829 m)   Wt 210 lb (95.3 kg)   SpO2 99%   BMI 28.48 kg/m   Allergies  Allergen Reactions  . Asa [Aspirin] Hives  . Penicillins Hives    Wt Readings from Last 3 Encounters:  08/24/19 210 lb (95.3 kg)  05/14/19 221 lb 6.4 oz (100.4 kg)  04/02/19 224 lb (101.6 kg)    Physical Exam Vitals and nursing note reviewed.  Constitutional:      General: He is not in acute distress.    Appearance: He is well-developed.  HENT:     Head: Normocephalic and atraumatic.  Eyes:     Conjunctiva/sclera: Conjunctivae normal.     Pupils: Pupils are equal, round, and reactive to light.  Cardiovascular:     Rate and Rhythm: Normal rate and regular rhythm.     Heart sounds: Normal heart sounds.  Pulmonary:     Effort: Pulmonary effort is normal. No respiratory distress.     Breath sounds: Normal breath sounds.    Skin:    General: Skin is warm and dry.  Psychiatric:        Behavior: Behavior normal.     Results for orders placed or performed in visit on 08/24/19  DRUG SCREEN-TOXASSURE  Result Value Ref Range   Summary Note   CBC with Differential/Platelet  Result Value Ref Range   WBC 8.3 3.4 - 10.8 x10E3/uL   RBC 4.39 4.14 - 5.80 x10E6/uL   Hemoglobin 13.5 13.0 - 17.7 g/dL   Hematocrit 39.7 37.5 - 51.0 %   MCV 90 79 - 97 fL   MCH 30.8 26.6 - 33.0 pg   MCHC 34.0 31.5 - 35.7 g/dL   RDW 13.2 11.6 - 15.4 %   Platelets 189 150 - 450 x10E3/uL   Neutrophils 57 Not Estab. %   Lymphs 34 Not Estab. %   Monocytes 8 Not Estab. %   Eos 1 Not Estab. %   Basos 0 Not Estab. %   Neutrophils Absolute 4.6 1.4 - 7.0 x10E3/uL   Lymphocytes Absolute 2.8 0.7 - 3.1 x10E3/uL   Monocytes Absolute 0.7 0.1 - 0.9 x10E3/uL   EOS (ABSOLUTE) 0.1 0.0 - 0.4 x10E3/uL   Basophils Absolute 0.0 0.0 - 0.2 x10E3/uL   Immature Granulocytes 0 Not Estab. %   Immature Grans (Abs) 0.0 0.0 - 0.1 x10E3/uL  CMP14+EGFR  Result Value Ref Range   Glucose 192 (H) 65 - 99 mg/dL   BUN 19 8 - 27 mg/dL   Creatinine, Ser 1.27 0.76 - 1.27 mg/dL   GFR calc non Af Amer 55 (L) >59 mL/min/1.73   GFR calc Af Amer 64 >59 mL/min/1.73   BUN/Creatinine Ratio 15 10 - 24   Sodium 137 134 - 144 mmol/L   Potassium 4.8 3.5 - 5.2 mmol/L   Chloride 101 96 - 106 mmol/L   CO2 24 20 - 29 mmol/L   Calcium 9.4 8.6 - 10.2 mg/dL   Total Protein 6.7 6.0 - 8.5 g/dL   Albumin 4.0 3.7 - 4.7 g/dL   Globulin, Total 2.7 1.5 - 4.5 g/dL   Albumin/Globulin Ratio 1.5 1.2 - 2.2   Bilirubin Total 0.6 0.0 - 1.2 mg/dL   Alkaline Phosphatase 82 39 - 117 IU/L   AST 14 0 - 40 IU/L   ALT 11 0 - 44 IU/L  Lipid Panel  Result Value Ref Range   Cholesterol, Total 193 100 - 199 mg/dL   Triglycerides 336 (H) 0 - 149 mg/dL   HDL 40 >39 mg/dL   VLDL Cholesterol Cal 56 (H) 5 - 40 mg/dL   LDL Chol Calc (NIH) 97 0 - 99 mg/dL   Chol/HDL Ratio 4.8 0.0 - 5.0 ratio  TSH   Result Value Ref Range   TSH 2.110 0.450 - 4.500 uIU/mL  hgba1c  Result Value Ref Range   HB A1C (BAYER DCA - WAIVED) 6.1 <7.0 %  PSA  Result Value Ref Range   Prostate Specific Ag, Serum 0.5 0.0 - 4.0 ng/mL  Microalbumin / creatinine urine ratio  Result Value Ref Range   Creatinine, Urine 168.1 Not Estab. mg/dL   Microalbumin, Urine 34.4 Not Estab. ug/mL   Microalb/Creat Ratio 20 0 - 29 mg/g creat      Assessment & Plan:   1. Chronic low back pain without sciatica, unspecified back pain laterality - DRUG SCREEN-TOXASSURE - oxyCODONE-acetaminophen (PERCOCET/ROXICET) 5-325 MG tablet; Take 1 tablet by mouth every 6 (six) hours as needed for severe pain.  Dispense: 120 tablet; Refill: 0 - Ambulatory referral to Pain Clinic  2. DDD (degenerative disc disease), cervical - DRUG SCREEN-TOXASSURE - oxyCODONE-acetaminophen (PERCOCET/ROXICET) 5-325 MG tablet; Take 1 tablet by mouth every 6 (six) hours as needed for severe pain.  Dispense: 120 tablet; Refill: 0 - Ambulatory referral to Pain Clinic  3. Cervical spinal stenosis - DRUG SCREEN-TOXASSURE - oxyCODONE-acetaminophen (PERCOCET/ROXICET) 5-325 MG tablet; Take 1 tablet by mouth every 6 (six) hours as needed for severe pain.  Dispense: 120 tablet; Refill: 0 - Ambulatory referral to Pain Clinic  4. Generalized anxiety disorder - DRUG SCREEN-TOXASSURE - ALPRAZolam (XANAX) 0.5 MG tablet; Take 1 tablet (0.5 mg total) by mouth at bedtime as needed for anxiety.  Dispense: 30 tablet; Refill: 2  5. Well adult exam - CBC with Differential/Platelet - CMP14+EGFR - Lipid Panel - TSH - hgba1c - PSA  6. Type 2 diabetes mellitus with diabetic neuropathy, with long-term current use of insulin (HCC) - hgba1c - Microalbumin / creatinine urine ratio  7. Essential hypertension - TSH - Microalbumin / creatinine urine ratio  8. Benign prostatic hyperplasia with incomplete bladder emptying - PSA    Continue all other maintenance  medications as listed above.  Follow up plan: Return in about 4 weeks (around 09/21/2019) for recheck medications, follow up anxiety, follow up pain meds.  Educational handout given for [place] PA-C [place] [address]  [place], [place] [address] [phone]   08/29/2019, 12:59 PM

## 2019-08-31 ENCOUNTER — Other Ambulatory Visit: Payer: Self-pay | Admitting: Physician Assistant

## 2019-08-31 NOTE — Progress Notes (Signed)
Agree with reduction in opioid and now discontinuation given failed drug screen.  Please make sure that patient has a clear instruction for taper from the benzodiazepine.  Willford Rabideau M. Lajuana Ripple, [facility]

## 2019-09-04 ENCOUNTER — Ambulatory Visit (INDEPENDENT_AMBULATORY_CARE_PROVIDER_SITE_OTHER): Payer: Medicare Other | Admitting: *Deleted

## 2019-09-04 DIAGNOSIS — E1140 Type 2 diabetes mellitus with diabetic neuropathy, unspecified: Secondary | ICD-10-CM | POA: Diagnosis not present

## 2019-09-04 DIAGNOSIS — Z794 Long term (current) use of insulin: Secondary | ICD-10-CM | POA: Diagnosis not present

## 2019-09-04 DIAGNOSIS — I739 Peripheral vascular disease, unspecified: Secondary | ICD-10-CM

## 2019-09-04 NOTE — Patient Instructions (Signed)
Visit Information  Goals Addressed            This Visit's Progress     Patient Stated   . "I want my toe to heal up" (pt-stated)       Toe wound in a patient with diabetes and peripheral vascular disease.   Current Barriers:  [name] Knowledge Deficits related to diabetic foot care and wound care . 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   Interventions:  . Discussed HPI with patient o Infection in Right 2nd toe since falling down an embankment in Sept 2020. Reports pain, redness, and drainage.  o He is cleaning it and changing the dressing daily and PRN . Inquired about appt with Dr Irving Shows o Patient advised that he has seen him once and has not had a follow-up appt . Patient has seen [facility] in [place] for his other foot and would like referral o Will collaborate with PCP regarding referral to wound center . Encouraged patient to reach out to PCP with any new or worsening symptoms  Patient Self Care Activities:  . Performs ADL's independently . Performs IADL's independently  Please see past updates related to this goal by clicking on the "Past Updates" button in the selected goal       . Improve Dry Nasal Passages (pt-stated)       Dry nasal passages and irritation in a patient with COPD.   Current Barriers:  [name] Knowledge Deficits related to how to improve dry nasal passages  Nurse Case Manager Clinical Goal(s):  [name] Over the next 14 days, patient will use OTC resources to improve dry nasal passages  Interventions:  . Advised that forced air heat will dry out nasal passages . Confirmed that patient does not use supplemental oxygen . Recommended saline nasal spray throughout the day as  needed . Recommended Cool mist humidifier . Recommended keeping thermostat turned down to the lowest comfortable level  Patient Self Care Activities:  . Performs ADL's independently . Performs IADL's independently  Initial goal documentation     . COMPLETED: Manage Medication Refills (pt-stated)       Current Barriers:  [name] Knowledge Deficits related to process for obtaining refills . Difficulty obtaining medications  Nurse Case Manager Clinical Goal(s):  [name] Over the next 5 days, patient will work with PCP office to obtain refill of Xarelto . Over the next 5 days, patient will talk with PCP clinical staff to schedule a follow-up appointment with Particia Nearing, PA-C for general follow-up and to refill Xanax  Interventions:  . RN was consulted by Theadore Nan, LCSW after he spoke with patient by telephone today o Patient relayed that he is out of Xarelto and Xanax . Chart reviewed o It doesn't appear that the patient's pharmacy has requested a refill . Collaboration with PCP clinical staff regarding refill of Xarelto and scheduling of f/u appointment o Patient should expect a call to schedule an appointment and the Xarelto should be refilled unless contraindicated   Patient Self Care Activities:  . Performs ADL's independently . Performs IADL's independently   Initial goal documentation  09/04/2019 Confirmed that patient did receive medication refill.         The care management team will reach out  to the patient again over the next 7 days.   [name], BSN, RN-BC Embedded Chronic Care Manager [facility] / [facility] Care Management Direct Dial: [phone]   The patient verbalized understanding of instructions provided today and declined a print copy of patient instruction materials.

## 2019-09-04 NOTE — Chronic Care Management (AMB) (Signed)
Chronic Care Management   Follow Up Note   09/04/2019 Name: Todd Zhang MRN: [id number] DOB: [birth year]  Referred by: Todd Sleeper, PA-C Reason for referral : Chronic Care Management (RN follow up)   Todd Zhang is a 73 y.o. year old male who is a primary care patient of Todd Sleeper, PA-C. The CCM team was consulted for assistance with chronic disease management and care coordination needs.    Review of patient status, including review of consultants reports, relevant laboratory and other test results, and collaboration with appropriate care team members and the patient's provider was performed as part of comprehensive patient evaluation and provision of chronic care management services.    I spoke with Todd Zhang by telephone today regarding his toe wound, dry nasal passages, and medication management.   Outpatient Encounter Medications as of 09/04/2019  Medication Sig  . ACCU-CHEK AVIVA PLUS test strip USE TO check blood glucose TWICE DAILY  . alendronate (FOSAMAX) 70 MG tablet Take 1 tablet (70 mg total) by mouth once a week. Take with a full glass of water on an empty stomach.  . ALPRAZolam (XANAX) 0.5 MG tablet Take 1 tablet (0.5 mg total) by mouth at bedtime as needed for anxiety.  [name] atorvastatin (LIPITOR) 40 MG tablet TAKE 1 TABLET BY MOUTH EVERY DAY  . benazepril (LOTENSIN) 5 MG tablet TAKE 1 TABLET BY MOUTH DAILY  . gabapentin (NEURONTIN) 300 MG capsule Take 1 capsule (300 mg total) by mouth 3 (three) times daily. For pain  . insulin regular (HUMULIN R) 100 units/mL injection INJECT 10 UNITS INTO THE SKIN THREE TIMES DAILY BEFORE MEALS.  Needs to be seen for future refills.  . Insulin Syringe-Needle U-100 (GLOBAL INJECT EASE INSULIN SYR) 31G X 5/16" 0.3 ML MISC Use with insulin Dx E11.9  . metFORMIN (GLUCOPHAGE) 500 MG tablet Take 1 tablet (500 mg total) by mouth 2 (two) times daily.  . metoprolol succinate (TOPROL-XL) 50 MG 24 hr tablet Take 1 tablet (50 mg total) by  mouth daily.  . Multiple Vitamin (MULTIVITAMIN) tablet Take 1 tablet by mouth daily.  [name] omeprazole (PRILOSEC) 20 MG capsule Take 1 capsule (20 mg total) by mouth daily.  [name] oxyCODONE-acetaminophen (PERCOCET) 5-325 MG tablet Take 1 tablet by mouth every 4 (four) hours as needed for severe pain. 2 of 3  . oxyCODONE-acetaminophen (PERCOCET) 5-325 MG tablet Take 1 tablet by mouth every 6 (six) hours as needed for severe pain. 3 of 3 (Patient not taking: Reported on 08/24/2019)  . oxyCODONE-acetaminophen (PERCOCET/ROXICET) 5-325 MG tablet Take 1 tablet by mouth every 6 (six) hours as needed for severe pain.  . tamsulosin (FLOMAX) 0.4 MG CAPS capsule Take 1 capsule (0.4 mg total) by mouth daily.  . vitamin B-12 (CYANOCOBALAMIN) 500 MCG tablet Take 500 mcg by mouth daily.  Alveda Reasons 20 MG TABS tablet Take 1 tablet (20 mg total) by mouth daily.   No facility-administered encounter medications on file as of 09/04/2019.     RN Care Plan   . "I want my toe to heal up" (pt-stated)       Toe wound in a patient with diabetes and peripheral vascular disease.   Current Barriers:  [name] Knowledge Deficits related to diabetic foot care and wound care . Chronic Disease Management support and education needs related to peripheral artery disease due to diabetes and hypertension  Nurse Case Manager Clinical Goal(s):  [name] Over the next 14 days, patient will work with Consulting civil engineer  to coordinate specialty appointment to assess and manage right 2nd toe wound/infection.   Interventions:  . Discussed HPI with patient o Infection in Right 2nd toe since falling down an embankment in Sept 2020. Reports pain, redness, and drainage.  o He is cleaning it and changing the dressing daily and PRN . Inquired about appt with [name] o Patient advised that he has seen him once and has not had a follow-up appt . [facility] [place] [facility] his other foot and would like referral o Will collaborate with PCP  regarding referral to wound center . Encouraged patient to reach out to PCP with any new or worsening symptoms  Patient Self Care Activities:  . Performs ADL's independently . Performs IADL's independently  Please see past updates related to this goal by clicking on the "Past Updates" button in the selected goal       . Improve Dry Nasal Passages (pt-stated)       Dry nasal passages and irritation in a patient with COPD.   Current Barriers:  [name] Knowledge Deficits related to how to improve dry nasal passages  Nurse Case Manager Clinical Goal(s):  [name] Over the next 14 days, patient will use OTC resources to improve dry nasal passages  Interventions:  . Advised that forced air heat will dry out nasal passages . Confirmed that patient does not use supplemental oxygen . Recommended saline nasal spray throughout the day as needed . Recommended Cool mist humidifier . Recommended keeping thermostat turned down to the lowest comfortable level  Patient Self Care Activities:  . Performs ADL's independently . Performs IADL's independently  Initial goal documentation     . COMPLETED: Manage Medication Refills (pt-stated)       Current Barriers:  [name] Knowledge Deficits related to process for obtaining refills . Difficulty obtaining medications  Nurse Case Manager Clinical Goal(s):  [name] Over the next 5 days, patient will work with PCP office to obtain refill of Xarelto . Over the next 5 days, patient will talk with PCP clinical staff to schedule a follow-up appointment with Todd Feeler, PA-C for general follow-up and to refill Xanax  Interventions:  . RN was consulted by Todd Few, LCSW after he spoke with patient by telephone today o Patient relayed that he is out of Xarelto and Xanax . Chart reviewed o It doesn't appear that the patient's pharmacy has requested a refill . Collaboration with PCP clinical staff regarding refill of Xarelto and scheduling of f/u appointment o Patient  should expect a call to schedule an appointment and the Xarelto should be refilled unless contraindicated   Patient Self Care Activities:  . Performs ADL's independently . Performs IADL's independently  09/04/2019 Confirmed that patient did receive medication refill.         Follow-up Plan: The care management team will reach out to the patient again over the next 10 days.    [name], BSN, RN-BC Embedded Chronic Care Manager [facility] / [facility] Care Management Direct Dial: [phone]

## 2019-09-06 ENCOUNTER — Other Ambulatory Visit: Payer: Self-pay | Admitting: Physician Assistant

## 2019-09-06 DIAGNOSIS — M5030 Other cervical disc degeneration, unspecified cervical region: Secondary | ICD-10-CM

## 2019-09-06 DIAGNOSIS — M545 Low back pain, unspecified: Secondary | ICD-10-CM

## 2019-09-06 DIAGNOSIS — M4802 Spinal stenosis, cervical region: Secondary | ICD-10-CM

## 2019-09-06 DIAGNOSIS — G8929 Other chronic pain: Secondary | ICD-10-CM

## 2019-09-10 ENCOUNTER — Ambulatory Visit: Payer: Self-pay | Admitting: Licensed Clinical Social Worker

## 2019-09-10 ENCOUNTER — Ambulatory Visit: Payer: Medicare Other | Admitting: *Deleted

## 2019-09-10 DIAGNOSIS — F411 Generalized anxiety disorder: Secondary | ICD-10-CM

## 2019-09-10 DIAGNOSIS — I4891 Unspecified atrial fibrillation: Secondary | ICD-10-CM

## 2019-09-10 DIAGNOSIS — K219 Gastro-esophageal reflux disease without esophagitis: Secondary | ICD-10-CM

## 2019-09-10 DIAGNOSIS — E1140 Type 2 diabetes mellitus with diabetic neuropathy, unspecified: Secondary | ICD-10-CM

## 2019-09-10 DIAGNOSIS — I10 Essential (primary) hypertension: Secondary | ICD-10-CM

## 2019-09-10 DIAGNOSIS — I739 Peripheral vascular disease, unspecified: Secondary | ICD-10-CM

## 2019-09-10 DIAGNOSIS — J441 Chronic obstructive pulmonary disease with (acute) exacerbation: Secondary | ICD-10-CM

## 2019-09-10 NOTE — Patient Instructions (Signed)
Visit Information  Goals Addressed            This Visit's Progress     Patient Stated   . "I want my toe to heal up" (pt-stated)       Toe wound in a patient with diabetes and peripheral vascular disease.   Current Barriers:  [name] Knowledge Deficits related to diabetic foot care and wound care . 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   Interventions:  . Discussed HPI with patient o Infection in Right 2nd toe since falling down an embankment in Sept 2020. Reports pain, redness, and drainage.  o He is cleaning it and changing the dressing daily and PRN . Inquired about appt with Dr Irving Shows o Patient advised that he has seen him once and has not had a follow-up appt . Collaborated with PCP to order wound care referral to [facility] per patient's request . Encouraged patient to reach out to PCP with any new or worsening symptoms  Patient Self Care Activities:  . Performs ADL's independently . Performs IADL's independently  Please see past updates related to this goal by clicking on the "Past Updates" button in the selected goal         Chong Sicilian, BSN, RN-BC [place] [facility] [place]: [phone]

## 2019-09-10 NOTE — Patient Instructions (Addendum)
Licensed Clinical Social Worker Visit Information  Goals we discussed today:  Goals    . "I need stable housing" (pt-stated)     Current Barriers:  . Lacks caregiver support.  . Financial Constraints in patient with Chronic Diagnoses of Type 2 DM, HTN, Atrial Fibrillation, GERD, COPD, DDD and GAD . Literacy barriers . transportation  Nurse Case Manager Clinical Goal(s):  [name] Over the next 30 days, patient will work with CCM team to address needs related to homelessness  Interventions:    LCSW discussed with client the current living arrangements of client  States that he has not been without shelter any this past year  He stays with various friends and some of them have pets indoors and fleas  He has furniture and belongings in storage LCSW talked with Dejean Tribby previously about financial needs of client.(client receives Food Stamps benefit each month)  Talked with client about medication needs of client  Encouraged client previously to [facility] in [place] to inquire about apartment availability (client has Medicaid)  Talked with client about pain issues of client  Talked with client about transport needs of client  Collaborated with RNCM regarding current nursing needs of client   Patient Self Care Activities:  . Performs ADL's independently . Performs IADL's independently  Plan:  Client to communicate with RNCM to discuss nursing needs LCSW to call client in next 4 weeks to talk about housing needs of client Client to attend scheduled client medical appointments.  Initial goal documentation         Materials Provided: No  Follow Up Plan:  LCSW to call client in next 4 weeks to assess the housing needs of client at that time  The patient verbalized understanding of instructions provided today and declined a print copy of patient instruction materials.   [name].Glynda Soliday MSW, LCSW Licensed Clinical Social Worker [facility] [phone]

## 2019-09-10 NOTE — Chronic Care Management (AMB) (Signed)
Care Management Note   Todd Zhang is a 73 y.o. year old male who is a primary care patient of Terald Sleeper, PA-C. The CM team was consulted for assistance with chronic disease management and care coordination.   I reached out to [name] by phone today.   Review of patient status, including review of consultants reports, relevant laboratory and other test results, and collaboration with appropriate care team members and the patient's provider was performed as part of comprehensive patient evaluation and provision of chronic care management services.   Social determinants of health: risk of physical inactivity; risk of food insecurity; risk of housing needs    Chronic Care Management from 06/13/2019 in [facility]  PHQ-9 Total Score  6     GAD 7 : Generalized Anxiety Score 06/13/2019  Nervous, Anxious, on Edge 1  Control/stop worrying 1  Worry too much - different things 1  Trouble relaxing 1  Restless 1  Easily annoyed or irritable 0  Afraid - awful might happen 0  Total GAD 7 Score 5  Anxiety Difficulty Somewhat difficult    Medications   (very important)  New medications from outside sources are available for reconciliation  ACCU-CHEK AVIVA PLUS test strip alendronate (FOSAMAX) 70 MG tablet ALPRAZolam (XANAX) 0.5 MG tablet atorvastatin (LIPITOR) 40 MG tablet benazepril (LOTENSIN) 5 MG tablet gabapentin (NEURONTIN) 300 MG capsule insulin regular (HUMULIN R) 100 units/mL injection Insulin Syringe-Needle U-100 (GLOBAL INJECT EASE INSULIN SYR) 31G X 5/16" 0.3 ML MISC metFORMIN (GLUCOPHAGE) 500 MG tablet metoprolol succinate (TOPROL-XL) 50 MG 24 hr tablet Multiple Vitamin (MULTIVITAMIN) tablet omeprazole (PRILOSEC) 20 MG capsule oxyCODONE-acetaminophen (PERCOCET) 5-325 MG tablet oxyCODONE-acetaminophen (PERCOCET) 5-325 MG tablet oxyCODONE-acetaminophen (PERCOCET/ROXICET) 5-325 MG tablet tamsulosin (FLOMAX) 0.4 MG CAPS capsule vitamin B-12  (CYANOCOBALAMIN) 500 MCG tablet XARELTO 20 MG TABS tablet  Goals    . "I need stable housing" (pt-stated)     Current Barriers:  . Lacks caregiver support.  . Financial Constraints in patient with Chronic Diagnoses of Type 2 DM, HTN, Atrial Fibrillation, GERD, COPD, DDD and GAD . Literacy barriers  Nurse Case Manager Clinical Goal(s):  [name] Over the next 30 days, patient will work with CCM team to address needs related to homelessness  Interventions:    LCSW discussed with client the  current living arrangements of client  ? States that he has not been without shelter any this past year ? He stays with various friends and some of them have pets indoors and fleas ? He has furniture and belongings in storage ? LCSW talked with Kenyon Ana previously about financial needs of client.(client receives Food Stamps benefit each month) ? Talked with client about medication needs of client ? Encouraged client previously to call [facility] apartments in [place] to inquire about apartment availability (client has Medicaid) ? Talked with client about pain issues of client ? Talked with client about transport needs of client ? Collaborated with RNCM regarding current nursing needs of client   Patient Self Care Activities:  . Performs ADL's independently . Performs IADL's independently  Plan:  Client to communicate with RNCM to discuss nursing needs LCSW to call client in next 4 weeks to talk about housing needs of client Client to attend scheduled client medical appointments.  Initial goal documentation       Follow Up Plan: LCSW to call client in next 4 weeks to assess the housing needs of client at that time  [name].Mee Macdonnell MSW, LCSW  Licensed Clinical Social Worker [facility] [phone]

## 2019-09-10 NOTE — Chronic Care Management (AMB) (Signed)
Chronic Care Management   Follow Up Note   09/10/2019 Name: DAMEIR GENTZLER MRN: [id number] DOB: Feb 28, 1946  Referred by: Remus Loffler, PA-C Reason for referral : Chronic Care Management (Care Coordination)   SIDDHARTH BABINGTON is a 73 y.o. year old male who is a primary care patient of Remus Loffler, PA-C. The CCM team was consulted for assistance with chronic disease management and care coordination needs.    Review of patient status, including review of consultants reports, relevant laboratory and other test results, and collaboration with appropriate care team members and the patient's provider was performed as part of comprehensive patient evaluation and provision of chronic care management services.    Received a call from [employer], LCSW regarding patient's right foot/toe wound.   Outpatient Encounter Medications as of 09/10/2019  Medication Sig  . ACCU-CHEK AVIVA PLUS test strip USE TO check blood glucose TWICE DAILY  . alendronate (FOSAMAX) 70 MG tablet Take 1 tablet (70 mg total) by mouth once a week. Take with a full glass of water on an empty stomach.  . ALPRAZolam (XANAX) 0.5 MG tablet Take 1 tablet (0.5 mg total) by mouth at bedtime as needed for anxiety.  [name] atorvastatin (LIPITOR) 40 MG tablet TAKE 1 TABLET BY MOUTH EVERY DAY  . benazepril (LOTENSIN) 5 MG tablet TAKE 1 TABLET BY MOUTH DAILY  . gabapentin (NEURONTIN) 300 MG capsule Take 1 capsule (300 mg total) by mouth 3 (three) times daily. For pain  . insulin regular (HUMULIN R) 100 units/mL injection INJECT 10 UNITS INTO THE SKIN THREE TIMES DAILY BEFORE MEALS.  Needs to be seen for future refills.  . Insulin Syringe-Needle U-100 (GLOBAL INJECT EASE INSULIN SYR) 31G X 5/16" 0.3 ML MISC Use with insulin Dx E11.9  . metFORMIN (GLUCOPHAGE) 500 MG tablet Take 1 tablet (500 mg total) by mouth 2 (two) times daily.  . metoprolol succinate (TOPROL-XL) 50 MG 24 hr tablet Take 1 tablet (50 mg total) by mouth daily.  . Multiple  Vitamin (MULTIVITAMIN) tablet Take 1 tablet by mouth daily.  [name] omeprazole (PRILOSEC) 20 MG capsule Take 1 capsule (20 mg total) by mouth daily.  [name] oxyCODONE-acetaminophen (PERCOCET) 5-325 MG tablet Take 1 tablet by mouth every 4 (four) hours as needed for severe pain. 2 of 3  . oxyCODONE-acetaminophen (PERCOCET) 5-325 MG tablet Take 1 tablet by mouth every 6 (six) hours as needed for severe pain. 3 of 3 (Patient not taking: Reported on 08/24/2019)  . oxyCODONE-acetaminophen (PERCOCET/ROXICET) 5-325 MG tablet Take 1 tablet by mouth every 6 (six) hours as needed for severe pain.  . tamsulosin (FLOMAX) 0.4 MG CAPS capsule Take 1 capsule (0.4 mg total) by mouth daily.  . vitamin B-12 (CYANOCOBALAMIN) 500 MCG tablet Take 500 mcg by mouth daily.  [name] 20 MG TABS tablet Take 1 tablet (20 mg total) by mouth daily.   No facility-administered encounter medications on file as of 09/10/2019.     RN Care Plan   . "I want my toe to heal up" (pt-stated)       Toe wound in a patient with diabetes and peripheral vascular disease.   Current Barriers:  [name] Knowledge Deficits related to diabetic foot care and wound care . 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   Interventions:  . Discussed HPI with patient o Infection in Right 2nd toe since falling down an embankment in Sept 2020. Reports pain, redness, and drainage.  o He is cleaning it and changing the dressing daily and PRN . Inquired about appt with Dr Irving Shows o Patient advised that he has seen him once and has not had a follow-up appt . Collaborated with PCP to order wound care referral to [facility] per patient's request . Encouraged patient to reach out to PCP with any new  or worsening symptoms  Patient Self Care Activities:  . Performs ADL's independently . Performs IADL's independently  Please see past updates related to this goal by clicking on the "Past Updates" button in the selected goal          Follow-up Plan The care management team will reach out to the patient again over the next 14 days.    Chong Sicilian, BSN, RN-BC Embedded Chronic Care Manager [facility] Direct Dial: [phone]

## 2019-09-11 ENCOUNTER — Ambulatory Visit: Payer: Medicare Other | Admitting: *Deleted

## 2019-09-11 DIAGNOSIS — I739 Peripheral vascular disease, unspecified: Secondary | ICD-10-CM

## 2019-09-11 DIAGNOSIS — E1140 Type 2 diabetes mellitus with diabetic neuropathy, unspecified: Secondary | ICD-10-CM | POA: Diagnosis not present

## 2019-09-11 DIAGNOSIS — Z794 Long term (current) use of insulin: Secondary | ICD-10-CM | POA: Diagnosis not present

## 2019-09-11 NOTE — Chronic Care Management (AMB) (Signed)
  Chronic Care Management   Follow Up Note   09/11/2019 Name: Todd Zhang MRN: [id number] DOB: 1945/10/24  Referred by: Terald Sleeper, PA-C Reason for referral : Chronic Care Management (RN follow up)   [name] is a 73 y.o. year old male who is a primary care patient of Terald Sleeper, PA-C. The CCM team was consulted for assistance with chronic disease management and care coordination needs.    Review of patient status, including review of consultants reports, relevant laboratory and other test results, and collaboration with appropriate care team members and the patient's provider was performed as part of comprehensive patient evaluation and provision of chronic care management services.     I spoke with [name] by telephone today. He is concerned that the wound is getting worse and we discussed the possibility of needing antibiotics.   Goals Addressed    . "I want my toe to heal up" (pt-stated)       Toe wound in a patient with diabetes and peripheral vascular disease.   Current Barriers:  [name] Knowledge Deficits related to diabetic foot care and wound care . 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   Interventions:  . Advised patient of referral to [facility] and that he should expect a call from them . Appointment scheduled with Dr Lajuana Ripple at [facility] on 12/30 at 2:00 to discuss wound and possible antibiotics . Advised to d/c alcohol and peroxide . Clean with mild soap and warm water and pat dry . Apply enough antibiotic ointment to keep gauze from sticking to wound . Change bandage as needed to keep the area clean and dry  Patient Self Care Activities:  . Performs ADL's  independently . Performs IADL's independently  Please see past updates related to this goal by clicking on the "Past Updates" button in the selected goal          Follow-up Plan The care management team will reach out to the patient again over the next 14 days.   Chong Sicilian, BSN, RN-BC Embedded Chronic Care Manager [facility] / [place] [facility] Direct Dial: [phone]

## 2019-09-11 NOTE — Patient Instructions (Signed)
Visit Information  Goals Addressed            This Visit's Progress     Patient Stated   . "I want my toe to heal up" (pt-stated)       Toe wound in a patient with diabetes and peripheral vascular disease.   Current Barriers:  [name] Knowledge Deficits related to diabetic foot care and wound care . 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   Interventions:  . Advised patient of referral to [facility] and that he should expect a call from them . Appointment scheduled with Dr Lajuana Ripple at [facility] on 12/30 at 2:00 to discuss wound and possible antibiotics . Advised to d/c alcohol and peroxide . Clean with mild soap and warm water and pat dry . Apply enough antibiotic ointment to keep gauze from sticking to wound . Change bandage as needed to keep the area clean and dry  Patient Self Care Activities:  . Performs ADL's independently . Performs IADL's independently  Please see past updates related to this goal by clicking on the "Past Updates" button in the selected goal          The care management team will reach out to the patient again over the next 14 days.   Chong Sicilian, BSN, RN-BC Embedded Chronic Care Manager [facility] [place] [facility] Direct Dial: [phone]   The patient verbalized understanding of instructions provided today and declined a print copy of patient instruction materials.

## 2019-09-12 ENCOUNTER — Ambulatory Visit (INDEPENDENT_AMBULATORY_CARE_PROVIDER_SITE_OTHER): Payer: Medicare Other | Admitting: Family Medicine

## 2019-09-12 DIAGNOSIS — M869 Osteomyelitis, unspecified: Secondary | ICD-10-CM

## 2019-09-12 MED ORDER — CIPROFLOXACIN HCL 750 MG PO TABS: 750.0000 mg | ORAL_TABLET | Freq: Two times a day (BID) | ORAL | 0 refills | Status: AC

## 2019-09-12 NOTE — Progress Notes (Signed)
Telephone visit  Subjective: CC: Osteomyelitis PCP: Todd Sleeper, PA-C Todd Zhang is a 73 y.o. male calls for telephone consult today. Patient provides verbal consent for consult held via phone.  Due to COVID-19 pandemic this visit was conducted virtually. This visit type was conducted due to national recommendations for restrictions regarding the COVID-19 Pandemic (e.g. social distancing, sheltering in place) in an effort to limit this patient's exposure and mitigate transmission in our community. All issues noted in this document were discussed and addressed.  A physical exam was not performed with this format.   Location of patient: home Location of provider: [facility] Others present for call: none  1. Foot wound He sees Todd Zhang in [place], who said he may need amputation.  He was placed on Cipro 750 mg.  Patient completed the course.  He has not yet followed up with his specialist because that clinic was shut down due to a Covid exposure.  He notes wound has been present for 2+ months.  He has been keeping it clean with peroxide and alcohol but this is not helping.  No fevers.  He reports some bleeding from wound.  He reports pain at the wound.  He is to be evaluated with wound care clinic in the next couple of days and has a follow-up visit with his PCP in about 2 weeks.  ROS: Per HPI  Allergies  Allergen Reactions  . Asa [Aspirin] Hives  . Penicillins Hives   Past Medical History:  Diagnosis Date  . Atrial fibrillation ([place])   . Cervical spinal stenosis   . DDD (degenerative disc disease), cervical   . DDD (degenerative disc disease), lumbar   . Diabetes mellitus without complication ([place])   . Hyperlipidemia   . Hypertension   . Peripheral artery disease (HCC)     Current Outpatient Medications:  .  ACCU-CHEK AVIVA PLUS test strip, USE TO check blood glucose TWICE DAILY, Disp: 100 strip, Rfl: 11 .  alendronate (FOSAMAX) 70 MG tablet, Take 1 tablet (70 mg total) by  mouth once a week. Take with a full glass of water on an empty stomach., Disp: 4 tablet, Rfl: 5 .  ALPRAZolam (XANAX) 0.5 MG tablet, Take 1 tablet (0.5 mg total) by mouth at bedtime as needed for anxiety., Disp: 30 tablet, Rfl: 2 .  atorvastatin (LIPITOR) 40 MG tablet, TAKE 1 TABLET BY MOUTH EVERY DAY, Disp: 30 tablet, Rfl: 2 .  benazepril (LOTENSIN) 5 MG tablet, TAKE 1 TABLET BY MOUTH DAILY, Disp: 30 tablet, Rfl: 4 .  gabapentin (NEURONTIN) 300 MG capsule, Take 1 capsule (300 mg total) by mouth 3 (three) times daily. For pain, Disp: 90 capsule, Rfl: 5 .  insulin regular (HUMULIN R) 100 units/mL injection, INJECT 10 UNITS INTO THE SKIN THREE TIMES DAILY BEFORE MEALS.  Needs to be seen for future refills., Disp: 10 mL, Rfl: 0 .  Insulin Syringe-Needle U-100 (GLOBAL INJECT EASE INSULIN SYR) 31G X 5/16" 0.3 ML MISC, Use with insulin Dx E11.9, Disp: 100 each, Rfl: 3 .  metFORMIN (GLUCOPHAGE) 500 MG tablet, Take 1 tablet (500 mg total) by mouth 2 (two) times daily., Disp: 60 tablet, Rfl: 5 .  metoprolol succinate (TOPROL-XL) 50 MG 24 hr tablet, Take 1 tablet (50 mg total) by mouth daily., Disp: 30 tablet, Rfl: 5 .  Multiple Vitamin (MULTIVITAMIN) tablet, Take 1 tablet by mouth daily., Disp: , Rfl:  .  omeprazole (PRILOSEC) 20 MG capsule, Take 1 capsule (20 mg total) by mouth daily.,  Disp: 30 capsule, Rfl: 5 .  oxyCODONE-acetaminophen (PERCOCET) 5-325 MG tablet, Take 1 tablet by mouth every 4 (four) hours as needed for severe pain. 2 of 3, Disp: 120 tablet, Rfl: 0 .  oxyCODONE-acetaminophen (PERCOCET) 5-325 MG tablet, Take 1 tablet by mouth every 6 (six) hours as needed for severe pain. 3 of 3 (Patient not taking: Reported on 08/24/2019), Disp: 120 tablet, Rfl: 0 .  oxyCODONE-acetaminophen (PERCOCET/ROXICET) 5-325 MG tablet, Take 1 tablet by mouth every 6 (six) hours as needed for severe pain., Disp: 120 tablet, Rfl: 0 .  tamsulosin (FLOMAX) 0.4 MG CAPS capsule, Take 1 capsule (0.4 mg total) by mouth daily.,  Disp: 30 capsule, Rfl: 11 .  vitamin B-12 (CYANOCOBALAMIN) 500 MCG tablet, Take 500 mcg by mouth daily., Disp: , Rfl:  .  XARELTO 20 MG TABS tablet, Take 1 tablet (20 mg total) by mouth daily., Disp: 30 tablet, Rfl: 5  Assessment/ Plan: 73 y.o. male   1. Osteomyelitis of left foot, unspecified type (HCC) We discussed that ideal treatment for osteomyelitis is typically inpatient with IV antibiotics.  However, Cipro does have some decent penetration into the bone and therefore we will proceed with oral Cipro 750 mg twice daily for 14 days.  He is already had an unknown amount of Cipro previously from his podiatrist.  Typical treatment is 6 weeks with Cipro.  He has a follow-up with his PCP in about 2 weeks and I will copy this note to her to see if she can reevaluate his foot.  He may benefit from evaluation by Todd. Lajoyce Corners who tends to specialize in this.  Will defer this to her evaluation.  I discussed with the patient red flag signs and symptoms warranting evaluation emergency department and he voiced good understanding. - ciprofloxacin (CIPRO) 750 MG tablet; Take 1 tablet (750 mg total) by mouth 2 (two) times daily for 14 days. For osteomyelitis  Dispense: 28 tablet; Refill: 0   Start time: 12:09pm End time: 12:20pm  Total time spent on patient care (including telephone call/ virtual visit): 18 minutes  Todd Zhang Hulen Skains, DO [facility] [phone]

## 2019-09-18 ENCOUNTER — Other Ambulatory Visit: Payer: Self-pay | Admitting: Physician Assistant

## 2019-09-18 DIAGNOSIS — E1140 Type 2 diabetes mellitus with diabetic neuropathy, unspecified: Secondary | ICD-10-CM

## 2019-09-19 ENCOUNTER — Ambulatory Visit: Payer: Medicare Other | Admitting: *Deleted

## 2019-09-19 DIAGNOSIS — Z794 Long term (current) use of insulin: Secondary | ICD-10-CM

## 2019-09-19 DIAGNOSIS — I739 Peripheral vascular disease, unspecified: Secondary | ICD-10-CM

## 2019-09-19 DIAGNOSIS — E1140 Type 2 diabetes mellitus with diabetic neuropathy, unspecified: Secondary | ICD-10-CM

## 2019-09-19 DIAGNOSIS — M869 Osteomyelitis, unspecified: Secondary | ICD-10-CM

## 2019-09-19 NOTE — Chronic Care Management (AMB) (Signed)
Chronic Care Management   Follow Up Note   09/19/2019 Name: Todd Zhang MRN: [id number] DOB: 1946/07/10  Referred by: Remus Loffler, PA-C Reason for referral : Chronic Care Management (RN follow up)   Todd Zhang is a 74 y.o. year old male who is a primary care patient of Remus Loffler, PA-C. The CCM team was consulted for assistance with chronic disease management and care coordination needs.    Review of patient status, including review of consultants reports, relevant laboratory and other test results, and collaboration with appropriate care team members and the patient's provider was performed as part of comprehensive patient evaluation and provision of chronic care management services.    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 See Care Plan for related entries.   Outpatient Encounter Medications as of 09/19/2019  Medication Sig  . ACCU-CHEK AVIVA PLUS test strip USE TO check blood glucose TWICE DAILY  . alendronate (FOSAMAX) 70 MG tablet Take 1 tablet (70 mg total) by mouth once a week. Take with a full glass of water on an empty stomach.  . ALPRAZolam (XANAX) 0.5 MG tablet Take 1 tablet (0.5 mg total) by mouth at bedtime as needed for anxiety.  [name] atorvastatin (LIPITOR) 40 MG tablet TAKE 1 TABLET BY MOUTH EVERY DAY  . benazepril (LOTENSIN) 5 MG tablet TAKE 1 TABLET BY MOUTH DAILY  . ciprofloxacin (CIPRO) 750 MG tablet Take 1 tablet (750 mg total) by mouth 2 (two) times daily for 14 days. For osteomyelitis  . gabapentin (NEURONTIN) 300 MG capsule Take 1 capsule (300 mg total) by mouth 3 (three) times daily. For pain  . insulin regular (HUMULIN R) 100 units/mL injection INJECT 10 UNITS INTO THE SKIN THREE TIMES DAILY BEFORE MEALS  . Insulin Syringe-Needle U-100 (GLOBAL INJECT EASE INSULIN SYR) 31G X 5/16" 0.3 ML MISC Use with insulin Dx E11.9  . metFORMIN (GLUCOPHAGE) 500 MG tablet Take 1 tablet (500 mg total) by  mouth 2 (two) times daily.  . metoprolol succinate (TOPROL-XL) 50 MG 24 hr tablet Take 1 tablet (50 mg total) by mouth daily.  . Multiple Vitamin (MULTIVITAMIN) tablet Take 1 tablet by mouth daily.  [name] omeprazole (PRILOSEC) 20 MG capsule TAKE ONE CAPSULE BY MOUTH DAILY  . oxyCODONE-acetaminophen (PERCOCET) 5-325 MG tablet Take 1 tablet by mouth every 4 (four) hours as needed for severe pain. 2 of 3  . oxyCODONE-acetaminophen (PERCOCET) 5-325 MG tablet Take 1 tablet by mouth every 6 (six) hours as needed for severe pain. 3 of 3 (Patient not taking: Reported on 08/24/2019)  . oxyCODONE-acetaminophen (PERCOCET/ROXICET) 5-325 MG tablet Take 1 tablet by mouth every 6 (six) hours as needed for severe pain.  . tamsulosin (FLOMAX) 0.4 MG CAPS capsule Take 1 capsule (0.4 mg total) by mouth daily.  . vitamin B-12 (CYANOCOBALAMIN) 500 MCG tablet Take 500 mcg by mouth daily.  [name] 20 MG TABS tablet Take 1 tablet (20 mg total) by mouth daily.   No facility-administered encounter medications on file as of 09/19/2019.     RN Care Plan           This Visit's Progress     Patient Stated   . "I need stable housing" (pt-stated)       Current Barriers:  . Lacks caregiver support.  . Financial Constraints in patient with Chronic Diagnoses of Type 2 DM, HTN, Atrial Fibrillation, GERD, COPD, DDD and GAD . Literacy barriers .  transportation  Nurse Case Manager Clinical Goal(s):  [name] Over the next 30 days, patient will work with CCM team to address needs related to homelessness  Interventions:  . Currently hospitalized . Will need adequate housing before discharge.  [name] LCSW has worked with patient regarding housing assistance. Will refer back to LCSW to discuss further.   Patient Self Care Activities:  . Performs ADL's independently . Performs IADL's independently  See previous goal updates     . "I want my toe to heal up" (pt-stated)       Toe wound in a patient with diabetes and peripheral  vascular disease.   Current Barriers:  [name] Knowledge Deficits related to diabetic foot care and wound care . 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   Interventions:  . Discussed HPI with patient o He was able to pickup antibiotics prescribed by [facility] o Currently inpatient at [facility] for toe/foot infection with no planned discharge date o Needs adequate housing before discharge . RN will f/u with patient after discharge to evaluate progress   Patient Self Care Activities:  . Performs ADL's independently . Performs IADL's independently  Please see past updates related to this goal by clicking on the "Past Updates" button in the selected goal          Follow-up Plan Patient will talk with LCSW regarding housing assistance RN will f/u with patient after discharge   Todd Zhang, BSN, RN-BC [place] / [place]: [phone]

## 2019-09-19 NOTE — Patient Instructions (Signed)
Visit Information  Goals Addressed            This Visit's Progress     Patient Stated   . "I need stable housing" (pt-stated)       Current Barriers:  . Lacks caregiver support.  . Financial Constraints in patient with Chronic Diagnoses of Type 2 DM, HTN, Atrial Fibrillation, GERD, COPD, DDD and GAD . Literacy barriers . transportation  Nurse Case Manager Clinical Goal(s):  [name] Over the next 30 days, patient will work with CCM team to address needs related to homelessness  Interventions:  . Currently hospitalized . Will need adequate housing before discharge.  [name] LCSW has worked with patient regarding housing assistance. Will refer back to LCSW to discuss further.   Patient Self Care Activities:  . Performs ADL's independently . Performs IADL's independently  See previous goal updates     . "I want my toe to heal up" (pt-stated)       Toe wound in a patient with diabetes and peripheral vascular disease.   Current Barriers:  [name] Knowledge Deficits related to diabetic foot care and wound care . 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   Interventions:  . Discussed HPI with patient o He was able to pickup antibiotics prescribed by [facility] o Currently inpatient at [facility] for toe/foot infection with no planned discharge date o Needs adequate housing before discharge . RN will f/u with patient after discharge to evaluate progress   Patient Self Care Activities:  . Performs ADL's independently . Performs IADL's independently  Please see past updates related to this goal by clicking on the "Past Updates" button in the selected goal          The care management team will reach out to the patient again over the next 15 days.   [name], BSN, RN-BC Embedded  Chronic Care Manager [facility] / [facility] Care Management Direct Dial: [phone]   The patient verbalized understanding of instructions provided today and declined a print copy of patient instruction materials.

## 2019-09-24 ENCOUNTER — Ambulatory Visit: Payer: Medicare Other | Admitting: Physician Assistant

## 2019-09-26 ENCOUNTER — Ambulatory Visit: Payer: Medicare Other | Admitting: *Deleted

## 2019-09-26 NOTE — Chronic Care Management (AMB) (Signed)
  Chronic Care Management   Outreach Note  09/26/2019 Name: ZAKEE DEERMAN MRN: [id number] DOB: 10/07/1945  Referred by: Remus Loffler, PA-C Reason for referral : Chronic Care Management (RN follow up)   An unsuccessful telephone follow-up was attempted today. The patient was referred to the case management team by for assistance with care management and care coordination.   Follow Up Plan: The care management team will reach out to the patient again over the next 7 days.   [name], BSN, RN-BC Embedded Chronic Care Manager [facility] / [facility] Care Management Direct Dial: [phone]

## 2019-10-03 ENCOUNTER — Ambulatory Visit (INDEPENDENT_AMBULATORY_CARE_PROVIDER_SITE_OTHER): Payer: Medicare Other | Admitting: *Deleted

## 2019-10-03 DIAGNOSIS — M545 Low back pain, unspecified: Secondary | ICD-10-CM

## 2019-10-03 DIAGNOSIS — I739 Peripheral vascular disease, unspecified: Secondary | ICD-10-CM

## 2019-10-03 DIAGNOSIS — G8929 Other chronic pain: Secondary | ICD-10-CM

## 2019-10-03 DIAGNOSIS — E1140 Type 2 diabetes mellitus with diabetic neuropathy, unspecified: Secondary | ICD-10-CM

## 2019-10-03 NOTE — Patient Instructions (Signed)
Visit Information  Goals Addressed            This Visit's Progress     Patient Stated   . "I want my toe to heal up" (pt-stated)       Toe wound in a patient with diabetes and peripheral vascular disease.   Current Barriers:  [name] Knowledge Deficits related to diabetic foot care and wound care . 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 Discussed HPI with patient o Discharged from [facility] o Following up with [facility] o Scheduled for surgery to remove two toes on his right foot on 10/09/19 . Encouraged patient to f/u with surgeon sooner if necessary due to pain and/or infection . Encouraged patient to keep appointment at wound care center for dressing change on 10/05/19   Patient Self Care Activities:  . Performs ADL's independently . Performs IADL's independently  Please see past updates related to this goal by clicking on the "Past Updates" button in the selected goal         Other   . Pain Management       Chronic pain in patient with diabetic neuropathy and peripheral vascular disease.   Current Barriers:  . Corporate treasurer.  . Transportation barriers  Nurse Case Manager Clinical Goal(s):  [name] Over the next 14 days, patient will contact [facility] to reschedule his pain management appointment  Interventions:  . Chart reviewed . Discussed with patient . Encouraged him to reach back out to [facility] and reschedule the appointment he had to cancel due to hospitalization  Patient Self Care Activities:  . Performs ADL's independently . Performs IADL's independently  Initial goal documentation       The care management team will reach out to the patient again over the next 14 days.     [name], BSN, RN-BC Embedded Chronic Care Manager [facility] / [facility] Care Management Direct Dial: [phone]   The patient verbalized understanding of instructions provided today and declined a print copy of patient instruction materials.

## 2019-10-03 NOTE — Chronic Care Management (AMB) (Signed)
  Chronic Care Management   Follow Up Note   10/03/2019 Name: Todd Zhang MRN: [id number] DOB: [birth year]  Referred by: Remus Loffler, PA-C Reason for referral : Chronic Care Management (RN follow up)   Todd Zhang is a 74 y.o. year old male who is a primary care patient of Remus Loffler, PA-C. The CCM team was consulted for assistance with chronic disease management and care coordination needs.    Review of patient status, including review of consultants reports, relevant laboratory and other test results, and collaboration with appropriate care team members and the patient's provider was performed as part of comprehensive patient evaluation and provision of chronic care management services.    I spoke with Todd Zhang by telephone. He was discharged from [facility] with plans for outpatient surgery next week to remove two toes on his right foot. He was also scheduled to see pain management but had to cancel due to being in the hospital. He has not rescheduled.   RN Care Plan    . "I want my toe to heal up"        Toe wound in a patient with diabetes and peripheral vascular disease.   Current Barriers:  [name] Knowledge Deficits related to diabetic foot care and wound care . 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 Discussed HPI with patient o Discharged from [facility] o Following up with [facility] o Scheduled for surgery to remove two toes on his right foot on 10/09/19 . Encouraged patient to f/u with surgeon sooner if necessary due to pain and/or infection . Encouraged patient to keep appointment at wound care center for dressing change on 10/05/19   Patient Self Care Activities:  . Performs ADL's  independently . Performs IADL's independently  Please see past updates related to this goal by clicking on the "Past Updates" button in the selected goal         . Pain Management       Chronic pain in patient with diabetic neuropathy, peripheral vascular disease, and chronic low back pain.  Current Barriers:  . Corporate treasurer.  . Transportation barriers  Nurse Case Manager Clinical Goal(s):  [name] Over the next 14 days, patient will contact [facility] to reschedule his pain management appointment  Interventions:  . Chart reviewed . Discussed with patient . Encouraged him to reach back out to [facility] and reschedule the appointment he had to cancel due to hospitalization  Patient Self Care Activities:  . Performs ADL's independently . Performs IADL's independently  Initial goal documentation         Plan:   The care management team will reach out to the patient again over the next 14 days.    [name], BSN, RN-BC Embedded Chronic Care Manager [facility] / [facility] Care Management Direct Dial: [phone]

## 2019-10-08 ENCOUNTER — Ambulatory Visit: Payer: Self-pay | Admitting: Licensed Clinical Social Worker

## 2019-10-08 DIAGNOSIS — Z794 Long term (current) use of insulin: Secondary | ICD-10-CM

## 2019-10-08 DIAGNOSIS — I10 Essential (primary) hypertension: Secondary | ICD-10-CM

## 2019-10-08 DIAGNOSIS — E1140 Type 2 diabetes mellitus with diabetic neuropathy, unspecified: Secondary | ICD-10-CM

## 2019-10-08 DIAGNOSIS — I4891 Unspecified atrial fibrillation: Secondary | ICD-10-CM

## 2019-10-08 DIAGNOSIS — J441 Chronic obstructive pulmonary disease with (acute) exacerbation: Secondary | ICD-10-CM

## 2019-10-08 DIAGNOSIS — K219 Gastro-esophageal reflux disease without esophagitis: Secondary | ICD-10-CM

## 2019-10-08 DIAGNOSIS — M5030 Other cervical disc degeneration, unspecified cervical region: Secondary | ICD-10-CM

## 2019-10-08 DIAGNOSIS — F411 Generalized anxiety disorder: Secondary | ICD-10-CM

## 2019-10-08 NOTE — Chronic Care Management (AMB) (Signed)
  Care Management Note   Todd Zhang is a 74 y.o. year old male who is a primary care patient of Remus Loffler, PA-C. The CM team was consulted for assistance with chronic disease management and care coordination.   I reached out to [name] by phone today.     Review of patient status, including review of consultants reports, relevant laboratory and other test results, and collaboration with appropriate care team members and the patient's provider was performed as part of comprehensive patient evaluation and provision of chronic care management services.   Social determinants of health: risk of social isolation; risk of tobacco use; risk of physical inactivity; risk of food insecurity; risk of housing needs.    Chronic Care Management from 06/13/2019 in [facility]  PHQ-9 Total Score  6      GAD 7 : Generalized Anxiety Score 06/13/2019  Nervous, Anxious, on Edge 1  Control/stop worrying 1  Worry too much - different things 1  Trouble relaxing 1  Restless 1  Easily annoyed or irritable 0  Afraid - awful might happen 0  Total GAD 7 Score 5  Anxiety Difficulty Somewhat difficult   Medications   (very important)  New medications from outside sources are available for reconciliation  ACCU-CHEK AVIVA PLUS test strip alendronate (FOSAMAX) 70 MG tablet ALPRAZolam (XANAX) 0.5 MG tablet atorvastatin (LIPITOR) 40 MG tablet benazepril (LOTENSIN) 5 MG tablet gabapentin (NEURONTIN) 300 MG capsule insulin regular (HUMULIN R) 100 units/mL injection Insulin Syringe-Needle U-100 (GLOBAL INJECT EASE INSULIN SYR) 31G X 5/16" 0.3 ML MISC metFORMIN (GLUCOPHAGE) 500 MG tablet metoprolol succinate (TOPROL-XL) 50 MG 24 hr tablet Multiple Vitamin (MULTIVITAMIN) tablet omeprazole (PRILOSEC) 20 MG capsule oxyCODONE-acetaminophen (PERCOCET) 5-325 MG tablet oxyCODONE-acetaminophen (PERCOCET) 5-325 MG tablet oxyCODONE-acetaminophen (PERCOCET/ROXICET) 5-325 MG  tablet tamsulosin (FLOMAX) 0.4 MG CAPS capsule vitamin B-12 (CYANOCOBALAMIN) 500 MCG tablet XARELTO 20 MG TABS tablet   Goals    . "I need stable housing" (pt-stated)     Current Barriers:  . Lacks caregiver support.  . Financial Constraints in patient with Chronic Diagnoses of Type 2 DM, HTN, Atrial Fibrillation, GERD, COPD, DDD and GAD . Literacy barriers . transportation  Nurse Case Manager Clinical Goal(s):  [name] Over the next 30 days, patient will work with CCM team to address needs related to homelessness  Interventions:  . Talked with client about upcoming client procedure on right foot on 10/09/2019.  [name] LCSW has worked with patient regarding housing assistance.  [name] LCSW has recommended that client apply, for housing support,  to [facility] apartment complex in [place], [place] . LCSW talked with client about current food supply of client  . Talked with client about sleeping challenges of client . Collaborated with RNCM [name] regarding nursing needs of client . Talked with client about pain issues of client  Patient Self Care Activities:  . Performs ADL's independently . Performs IADL's independently  See previous goal updates       Follow Up Plan: LCSW to call client in next 4 weeks to assess the housing needs of client at that time  [name] MSW, LCSW Licensed Clinical Social Worker [facility] Care Management [phone]

## 2019-10-08 NOTE — Patient Instructions (Addendum)
Licensed Clinical Social Worker Visit Information  Goals we discussed today:  Goals    . "I need stable housing" (pt-stated)     Current Barriers:  . Lacks caregiver support.  . Financial Constraints in patient with Chronic Diagnoses of Type 2 DM, HTN, Atrial Fibrillation, GERD, COPD, DDD and GAD . Literacy barriers . transportation  Nurse Case Manager Clinical Goal(s):  [name] Over the next 30 days, patient will work with CCM team to address needs related to homelessness  Interventions:  Talked with client about upcoming client procedure on right foot on 10/09/2019.  LCSW has worked with patient regarding housing assistance.  LCSW has recommended that client apply, for housing support, to [facility] apartment complex in [place], [place]  LCSW talked with client about current food supply of client  Talked with client about sleeping challenges of client  Collaborated with RNCM [name] regarding nursing needs of client  Talked with client about pain issues of client  Patient Self Care Activities:  . Performs ADL's independently . Performs IADL's independently  See previous goal updates       Materials Provided: No  Follow Up Plan: LCSW to call client in next 4 weeks to assess the housing needs of client at that time  The patient verbalized understanding of instructions provided today and declined a print copy of patient instruction materials.   Kelton Pillar.Glen Blatchley MSW, LCSW Licensed Clinical Social Worker [facility] Care Management [phone]

## 2019-10-15 ENCOUNTER — Ambulatory Visit (INDEPENDENT_AMBULATORY_CARE_PROVIDER_SITE_OTHER): Payer: Medicare Other | Admitting: *Deleted

## 2019-10-15 DIAGNOSIS — I739 Peripheral vascular disease, unspecified: Secondary | ICD-10-CM

## 2019-10-15 DIAGNOSIS — E1140 Type 2 diabetes mellitus with diabetic neuropathy, unspecified: Secondary | ICD-10-CM

## 2019-10-15 NOTE — Chronic Care Management (AMB) (Signed)
  Chronic Care Management   Follow Up Note   10/15/2019 Name: Todd Zhang MRN: [id number] DOB: 1946/03/20  Referred by: Remus Loffler, PA-C Reason for referral : Chronic Care Management (RN follow-up)   Todd Zhang is a 74 y.o. year old male who is a primary care patient of Remus Loffler, PA-C. The CCM team was consulted for assistance with chronic disease management and care coordination needs.    Review of patient status, including review of consultants reports, relevant laboratory and other test results, and collaboration with appropriate care team members and the patient's provider was performed as part of comprehensive patient evaluation and provision of chronic care management services.     RN Care Plan   . "I want my toe to heal up"        Toe wound in a patient with diabetes and peripheral vascular disease.   Current Barriers:  [name] Knowledge Deficits related to diabetic foot care and wound care . 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 Discussed HPI with patient o Had surgery on 1/29 at [facility] to remove right middle toed o Has follow-up appointment with surgeon tomorrow o Reports that pain is poorly controlled with hydrocodone. He has advised his surgeon's office . Encouraged patient to reach out to surgeon with any increase in bleeding or pain or if any s/s of infection develop . Reach out to [facility] team as neeeded . RN CCM will f/u with patient over the next 3 weeks   Patient Self Care Activities:  . Performs ADL's independently . Performs IADL's independently  Please see past updates related to this goal by clicking on the "Past Updates" button in the selected goal         . Pain Management       Chronic pain in  patient with diabetic neuropathy and peripheral vascular disease.   Current Barriers:  . Corporate treasurer.  . Transportation barriers  Nurse Case Manager Clinical Goal(s):  [name] Over the next 14 days, patient will contact [facility] to reschedule his pain management appointment  Interventions:  . Chart reviewed . Discussed with patient . Encouraged him to reach back out to [facility] and reschedule the appointment he had to cancel due to hospitalization. Patient agreed to contact them this week.  [name] Answered patient's questions. Advised that his medical records would have been sent to [facility] along with the referral   Patient Self Care Activities:  . Performs ADL's independently . Performs IADL's independently  Please see past updates related to this goal by clicking on the "Past Updates" button in the selected goal          Follow-up Plan:   The care management team will reach out to the patient again over the next 21 days.    [name], BSN, RN-BC Embedded Chronic Care Manager [facility] / [facility] Care Management Direct Dial: [phone]

## 2019-10-15 NOTE — Patient Instructions (Signed)
Visit Information  Goals Addressed            This Visit's Progress     Patient Stated   . "I want my toe to heal up" (pt-stated)       Toe wound in a patient with diabetes and peripheral vascular disease.   Current Barriers:  [name] Knowledge Deficits related to diabetic foot care and wound care . 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 Discussed HPI with patient o Had surgery on 1/29 at [facility] to remove right middle toed o Has follow-up appointment with surgeon tomorrow o Reports that pain is poorly controlled with hydrocodone. He has advised his surgeon's office . Encouraged patient to reach out to surgeon with any increase in bleeding or pain or if any s/s of infection develop . Reach out to [facility] team as neeeded . RN CCM will f/u with patient over the next 3 weeks   Patient Self Care Activities:  . Performs ADL's independently . Performs IADL's independently  Please see past updates related to this goal by clicking on the "Past Updates" button in the selected goal         Other   . Pain Management       Chronic pain in patient with diabetic neuropathy and peripheral vascular disease.   Current Barriers:  . Corporate treasurer.  . Transportation barriers  Nurse Case Manager Clinical Goal(s):  [name] Over the next 14 days, patient will contact [facility] to reschedule his pain management appointment  Interventions:  . Chart reviewed . Discussed with patient . Encouraged him to reach back out to [facility] and reschedule the appointment he had to cancel due to hospitalization. Patient agreed to contact them this week.  [name] Answered patient's questions. Advised that his medical records would have been sent to [facility] along with the referral   Patient Self Care Activities:  . Performs ADL's independently . Performs IADL's independently  Please see past updates related to this goal by clicking on the "Past Updates" button in the selected goal          The care management team will reach out to the patient again over the next 21 days.    [name], BSN, RN-BC Embedded Chronic Care Manager [facility] Care Management Direct Dial: [phone]   The patient verbalized understanding of instructions provided today and declined a print copy of patient instruction materials.

## 2019-10-18 ENCOUNTER — Other Ambulatory Visit: Payer: Self-pay | Admitting: Physician Assistant

## 2019-10-18 DIAGNOSIS — N401 Enlarged prostate with lower urinary tract symptoms: Secondary | ICD-10-CM

## 2019-10-18 DIAGNOSIS — I10 Essential (primary) hypertension: Secondary | ICD-10-CM

## 2019-10-18 DIAGNOSIS — R3914 Feeling of incomplete bladder emptying: Secondary | ICD-10-CM

## 2019-10-21 ENCOUNTER — Other Ambulatory Visit: Payer: Self-pay | Admitting: Physician Assistant

## 2019-10-21 DIAGNOSIS — E1140 Type 2 diabetes mellitus with diabetic neuropathy, unspecified: Secondary | ICD-10-CM

## 2019-10-21 DIAGNOSIS — Z794 Long term (current) use of insulin: Secondary | ICD-10-CM

## 2019-10-22 ENCOUNTER — Ambulatory Visit: Payer: Self-pay | Admitting: Licensed Clinical Social Worker

## 2019-10-22 DIAGNOSIS — I4891 Unspecified atrial fibrillation: Secondary | ICD-10-CM

## 2019-10-22 DIAGNOSIS — K219 Gastro-esophageal reflux disease without esophagitis: Secondary | ICD-10-CM

## 2019-10-22 DIAGNOSIS — M5030 Other cervical disc degeneration, unspecified cervical region: Secondary | ICD-10-CM

## 2019-10-22 DIAGNOSIS — F411 Generalized anxiety disorder: Secondary | ICD-10-CM

## 2019-10-22 DIAGNOSIS — E1140 Type 2 diabetes mellitus with diabetic neuropathy, unspecified: Secondary | ICD-10-CM

## 2019-10-22 DIAGNOSIS — J441 Chronic obstructive pulmonary disease with (acute) exacerbation: Secondary | ICD-10-CM

## 2019-10-22 DIAGNOSIS — I10 Essential (primary) hypertension: Secondary | ICD-10-CM

## 2019-10-22 NOTE — Chronic Care Management (AMB) (Signed)
  Care Management Note   Todd Zhang is a 74 y.o. year old male who is a primary care patient of Remus Loffler, PA-C. The CM team was consulted for assistance with chronic disease management and care coordination.   I reached out to [name] by phone today.     Review of patient status, including review of consultants reports, relevant laboratory and other test results, and collaboration with appropriate care team members and the patient's provider was performed as part of comprehensive patient evaluation and provision of chronic care management services.   Social determinants of health: risk of social isolation; risk of tobacco use; risk of depression; risk of physical inactivity; risk of food insecurity; risk of housing needs    Chronic Care Management from 06/13/2019 in [facility]  PHQ-9 Total Score  6      GAD 7 : Generalized Anxiety Score 06/13/2019  Nervous, Anxious, on Edge 1  Control/stop worrying 1  Worry too much - different things 1  Trouble relaxing 1  Restless 1  Easily annoyed or irritable 0  Afraid - awful might happen 0  Total GAD 7 Score 5  Anxiety Difficulty Somewhat difficult   Medications   (very important)  New medications from outside sources are available for reconciliation  ACCU-CHEK AVIVA PLUS test strip alendronate (FOSAMAX) 70 MG tablet ALPRAZolam (XANAX) 0.5 MG tablet atorvastatin (LIPITOR) 40 MG tablet benazepril (LOTENSIN) 5 MG tablet gabapentin (NEURONTIN) 300 MG capsule insulin regular (HUMULIN R) 100 units/mL injection Insulin Syringe-Needle U-100 (GLOBAL INJECT EASE INSULIN SYR) 31G X 5/16" 0.3 ML MISC metFORMIN (GLUCOPHAGE) 500 MG tablet metoprolol succinate (TOPROL-XL) 50 MG 24 hr tablet Multiple Vitamin (MULTIVITAMIN) tablet omeprazole (PRILOSEC) 20 MG capsule oxyCODONE-acetaminophen (PERCOCET) 5-325 MG tablet oxyCODONE-acetaminophen (PERCOCET) 5-325 MG tablet oxyCODONE-acetaminophen (PERCOCET/ROXICET)  5-325 MG tablet tamsulosin (FLOMAX) 0.4 MG CAPS capsule vitamin B-12 (CYANOCOBALAMIN) 500 MCG tablet XARELTO 20 MG TABS tablet  Goals    . "I need stable housing" (pt-stated)     Current Barriers:  . Lacks caregiver support.  . Financial Constraints in patient with Chronic Diagnoses of Type 2 DM, HTN, Atrial Fibrillation, GERD, COPD, DDD and GAD . Literacy barriers . transportation  Nurse Case Manager Clinical Goal(s):  [name] Over the next 30 days, patient will work with CCM team to address needs related to homelessness  Interventions:   LCSW has worked with patient regarding housing assistance.   LCSW has recommended that client apply, for housing support,  to [facility] apartment complex in [place], [place]  LCSW talked with client about current food supply of client   Talked with client about sleeping challenges of client  Previously collaborated with [facility] [name] regarding nursing needs of client  Talked with client about pain issues of client  Talked with client about his upcoming medical appointments  Talked with [place] about transport needs of client   LCSW provided client with phone number for [employer] in [place], [place]  Patient Self Care Activities:  . Performs ADL's independently . Performs IADL's independently  See previous goal updates        Follow Up Plan: LCSW to call client in next 4 weeks to assess the housing needs of client at that time  Kelton Pillar.Aziza Stuckert MSW, LCSW Licensed Clinical Social Worker [facility] Care Management [phone]

## 2019-10-22 NOTE — Patient Instructions (Addendum)
Licensed Clinical Social Worker Visit Information  Goals we discussed today:  Goals    . "I need stable housing" (pt-stated)     Current Barriers:  . Lacks caregiver support.  . Financial Constraints in patient with Chronic Diagnoses of Type 2 DM, HTN, Atrial Fibrillation, GERD, COPD, DDD and GAD . Literacy barriers . transportation  Nurse Case Manager Clinical Goal(s):  [name] Over the next 30 days, patient will work with CCM team to address needs related to homelessness  Interventions:  LCSW has worked with patient regarding housing assistance.  LCSW has recommended that client apply, for housing support, to [facility] apartment complex in [place], [place]  LCSW talked with client about current food supply of client  Talked with client about sleeping challenges of client  Previously collaborated with [facility] [name] regarding nursing needs of client Talked with client about pain issues of client Talked with client about his upcoming medical appointments Talked with [name] about transport needs of client  LCSW provided client with phone number for [employer] in [place], [place]  Patient Self Care Activities:  . Performs ADL's independently . Performs IADL's independently  See previous goal updates       Materials Provided: No  Follow Up Plan: LCSW to call client in next 4 weeks to assess the housing needs of client at that time  The patient verbalized understanding of instructions provided today and declined a print copy of patient instruction materials.   [name] MSW, LCSW Licensed Clinical Social Worker [facility] Care Management [phone]

## 2019-10-30 ENCOUNTER — Ambulatory Visit: Payer: Medicare Other | Admitting: *Deleted

## 2019-10-30 NOTE — Chronic Care Management (AMB) (Signed)
  Chronic Care Management   Outreach Note  10/30/2019 Name: Todd Zhang MRN: [id number] DOB: Feb 04, 1946  Referred by: Remus Loffler, PA-C Reason for referral : Chronic Care Management (RN Follow Up)   An unsuccessful telephone follow-up was attempted today. The patient was referred to the case management team for assistance with care management and care coordination.   Follow Up Plan: The care management team will reach out to the patient again over the next 15 days.   [name], BSN, RN-BC Embedded Chronic Care Manager [facility] / [facility] Care Management Direct Dial: [phone]

## 2019-11-08 ENCOUNTER — Ambulatory Visit: Payer: Medicare Other | Admitting: *Deleted

## 2019-11-08 DIAGNOSIS — I739 Peripheral vascular disease, unspecified: Secondary | ICD-10-CM

## 2019-11-08 DIAGNOSIS — E1140 Type 2 diabetes mellitus with diabetic neuropathy, unspecified: Secondary | ICD-10-CM

## 2019-11-08 DIAGNOSIS — Z794 Long term (current) use of insulin: Secondary | ICD-10-CM | POA: Diagnosis not present

## 2019-11-08 DIAGNOSIS — J441 Chronic obstructive pulmonary disease with (acute) exacerbation: Secondary | ICD-10-CM

## 2019-11-08 DIAGNOSIS — I4891 Unspecified atrial fibrillation: Secondary | ICD-10-CM

## 2019-11-08 DIAGNOSIS — I10 Essential (primary) hypertension: Secondary | ICD-10-CM | POA: Diagnosis not present

## 2019-11-08 NOTE — Chronic Care Management (AMB) (Signed)
Chronic Care Management   Follow Up Note   11/08/2019 Name: Todd Zhang MRN: [id number] DOB: 1946/06/13  Referred by: Terald Sleeper, PA-C Reason for referral : Chronic Care Management (RN follow up)   Todd Zhang is a 74 y.o. year old male who is a primary care patient of Terald Sleeper, PA-C. The CCM team was consulted for assistance with chronic disease management and care coordination needs.    Review of patient status, including review of consultants reports, relevant laboratory and other test results, and collaboration with appropriate care team members and the patient's provider was performed as part of comprehensive patient evaluation and provision of chronic care management services.    I spoke with Todd Zhang by telephone today.  Outpatient Encounter Medications as of 11/08/2019  Medication Sig  . ACCU-CHEK AVIVA PLUS test strip USE TO check blood glucose TWICE DAILY  . alendronate (FOSAMAX) 70 MG tablet Take 1 tablet (70 mg total) by mouth once a week. Take with a full glass of water on an empty stomach.  . ALPRAZolam (XANAX) 0.5 MG tablet Take 1 tablet (0.5 mg total) by mouth at bedtime as needed for anxiety.  [name] atorvastatin (LIPITOR) 40 MG tablet TAKE 1 TABLET BY MOUTH EVERY DAY  . benazepril (LOTENSIN) 5 MG tablet TAKE 1 TABLET BY MOUTH DAILY  . gabapentin (NEURONTIN) 300 MG capsule Take 1 capsule (300 mg total) by mouth 3 (three) times daily. For pain  . insulin regular (HUMULIN R) 100 units/mL injection INJECT 10 UNITS INTO THE SKIN THREE TIMES DAILY BEFORE MEALS  . Insulin Syringe-Needle U-100 (GLOBAL INJECT EASE INSULIN SYR) 31G X 5/16" 0.3 ML MISC Use with insulin Dx E11.9  . metFORMIN (GLUCOPHAGE) 500 MG tablet Take 1 tablet (500 mg total) by mouth 2 (two) times daily.  . metoprolol succinate (TOPROL-XL) 50 MG 24 hr tablet Take 1 tablet (50 mg total) by mouth daily.  . Multiple Vitamin (MULTIVITAMIN) tablet Take 1 tablet by mouth daily.  [name] omeprazole (PRILOSEC) 20  MG capsule TAKE ONE CAPSULE BY MOUTH DAILY  . oxyCODONE-acetaminophen (PERCOCET) 5-325 MG tablet Take 1 tablet by mouth every 4 (four) hours as needed for severe pain. 2 of 3  . oxyCODONE-acetaminophen (PERCOCET) 5-325 MG tablet Take 1 tablet by mouth every 6 (six) hours as needed for severe pain. 3 of 3 (Patient not taking: Reported on 08/24/2019)  . oxyCODONE-acetaminophen (PERCOCET/ROXICET) 5-325 MG tablet Take 1 tablet by mouth every 6 (six) hours as needed for severe pain.  . tamsulosin (FLOMAX) 0.4 MG CAPS capsule TAKE 1 CAPSULE BY MOUTH EVERY DAY  . vitamin B-12 (CYANOCOBALAMIN) 500 MCG tablet Take 500 mcg by mouth daily.  Alveda Reasons 20 MG TABS tablet Take 1 tablet (20 mg total) by mouth daily.   No facility-administered encounter medications on file as of 11/08/2019.     RN Care Plan   . "I want my toe to heal up"        Toe wound in a patient with diabetes and peripheral vascular disease.   Current Barriers:  [name] Knowledge Deficits related to diabetic foot care and wound care . Chronic Disease Management support and education needs related to peripheral artery disease due to diabetes and hypertension  Nurse Case Manager Clinical Goal(s):  [name] Over the next 30 days, patient will continue to visit the wound care center 3 times a week  Interventions:  . discussed HPI with patient o Had surgery on 1/29 at [facility] to remove  right middle toed o Going to [facility] 3 times a week . Encouraged patient to reach out to surgeon with any increase in bleeding or pain or if any s/s of infection develop . Reach out to [facility] team as neeeded   Patient Self Care Activities:  . Performs ADL's independently . Performs IADL's independently  Please see past updates related to this goal by clicking on the "Past Updates" button in the selected goal       . Hematuria       CARE PLAN ENTRY (see longtitudinal plan of care for additional care plan information) Hematuria on  urinalysis in patient with HTN, DM, and BPH  Current Barriers:  [name] Knowledge Deficits related to hematuria . Literacy barriers . Transportation barriers . Cognitive Deficits  Nurse Case Manager Clinical Goal(s):  [name] Over the next 7 days, patient will follow-up with [facility] for a renal/bladder ultrasound  Interventions:  . Chart reviewed . Talked with patient by telephone.  o He reports having blood in his urine when it was checked at [facility] o States that he is supposed to have an ultrasound but that it hasn't been scheduled. He was confused about where it was going to be scheduled.  Coralyn Pear out to [facility] [phone] o Confirmed that he did have hematuria on urinalysis on 10/26/19 and that a renal/bladder ultrasound was recommended o They are able to perform that in their office. Patient has not been scheduled.  o Requested that they reach out to patient to schedule this test. They were agreeable and said that the front desk staff would reach out to him . RN will follow-up with patient again over the next 10 days  Patient Self Care Activities:  . Performs ADL's independently . Performs IADL's independently  Initial goal documentation     . Pain Management       Chronic pain in patient with diabetic neuropathy and peripheral vascular disease.   Current Barriers:  . Corporate treasurer.  . Transportation barriers  Nurse Case Manager Clinical Goal(s):  [name] Over the next 90 days, patient will continue to see [facility]  Interventions:  . Chart reviewed . Talked with patient by phone  . Consulted with [facility] [phone]  o Patient was seen on 10/26/19 o Asked them to reach out to him to schedule a follow-up appointment o They confirmed that they would  Patient Self Care Activities:  . Performs ADL's independently . Performs IADL's independently  Please see past updates related to this goal by clicking on the  "Past Updates" button in the selected goal          Plan:   The care management team will reach out to the patient again over the next 10 days.   [name], BSN, RN-BC Embedded Chronic Care Manager [facility] Care Management Direct Dial: [phone]

## 2019-11-08 NOTE — Patient Instructions (Signed)
Visit Information  Goals Addressed            This Visit's Progress     Patient Stated   . "I want my toe to heal up" (pt-stated)       Toe wound in a patient with diabetes and peripheral vascular disease.   Current Barriers:  [name] Knowledge Deficits related to diabetic foot care and wound care . Chronic Disease Management support and education needs related to peripheral artery disease due to diabetes and hypertension  Nurse Case Manager Clinical Goal(s):  [name] Over the next 30 days, patient will continue to visit the wound care center 3 times a week  Interventions:  . discussed HPI with patient o Had surgery on 1/29 at [facility] to remove right middle toed o Going to [facility] 3 times a week . Encouraged patient to reach out to surgeon with any increase in bleeding or pain or if any s/s of infection develop . Reach out to [facility] team as neeeded   Patient Self Care Activities:  . Performs ADL's independently . Performs IADL's independently  Please see past updates related to this goal by clicking on the "Past Updates" button in the selected goal         Other   . Hematuria       CARE PLAN ENTRY (see longtitudinal plan of care for additional care plan information) Hematuria on urinalysis in patient with HTN, DM, and BPH  Current Barriers:  [name] Knowledge Deficits related to hematuria . Literacy barriers . Transportation barriers . Cognitive Deficits  Nurse Case Manager Clinical Goal(s):  [name] Over the next 7 days, patient will follow-up with [facility] for a renal/bladder ultrasound  Interventions:  . Chart reviewed . Talked with patient by telephone.  o He reports having blood in his urine when it was checked at [facility] that he is supposed to have an ultrasound but that it hasn't been scheduled. He was confused about where it was going to be scheduled.  Jeralene Huff out to [facility] o Confirmed that he did  have hematuria on urinalysis on 10/26/19 and that a renal/bladder ultrasound was recommended o They are able to perform that in their office. Patient has not been scheduled.  o Requested that they reach out to patient to schedule this test. They were agreeable and said that the front desk staff would reach out to him . RN will follow-up with patient again over the next 10 days  Patient Self Care Activities:  . Performs ADL's independently . Performs IADL's independently  Initial goal documentation     . Pain Management       Chronic pain in patient with diabetic neuropathy and peripheral vascular disease.   Current Barriers:  . Film/video editor.  . Transportation barriers  Nurse Case Manager Clinical Goal(s):  [name] Over the next 90 days, patient will continue to see [facility]  Interventions:  . Chart reviewed . Talked with patient by phone  . Consulted with [facility] [phone]  o Patient was seen on 10/26/19 o Asked them to reach out to him to schedule a follow-up appointment o They confirmed that they would  Patient Self Care Activities:  . Performs ADL's independently . Performs IADL's independently  Please see past updates related to this goal by clicking on the "Past Updates" button in the selected goal        The care management team  will reach out to the patient again over the next 10 days.   [name], BSN, RN-BC Embedded Chronic Care Manager [facility] Care Management Direct Dial: [phone]   The patient verbalized understanding of instructions provided today and declined a print copy of patient instruction materials.

## 2019-11-13 ENCOUNTER — Ambulatory Visit (INDEPENDENT_AMBULATORY_CARE_PROVIDER_SITE_OTHER): Payer: Medicare Other | Admitting: *Deleted

## 2019-11-13 DIAGNOSIS — E1140 Type 2 diabetes mellitus with diabetic neuropathy, unspecified: Secondary | ICD-10-CM | POA: Diagnosis not present

## 2019-11-13 DIAGNOSIS — Z794 Long term (current) use of insulin: Secondary | ICD-10-CM | POA: Diagnosis not present

## 2019-11-13 DIAGNOSIS — I739 Peripheral vascular disease, unspecified: Secondary | ICD-10-CM

## 2019-11-13 DIAGNOSIS — I10 Essential (primary) hypertension: Secondary | ICD-10-CM | POA: Diagnosis not present

## 2019-11-13 DIAGNOSIS — G8929 Other chronic pain: Secondary | ICD-10-CM

## 2019-11-13 DIAGNOSIS — M545 Low back pain, unspecified: Secondary | ICD-10-CM

## 2019-11-13 DIAGNOSIS — J441 Chronic obstructive pulmonary disease with (acute) exacerbation: Secondary | ICD-10-CM | POA: Diagnosis not present

## 2019-11-13 NOTE — Patient Instructions (Signed)
Visit Information  Goals Addressed            This Visit's Progress   . Hematuria       CARE PLAN ENTRY (see longtitudinal plan of care for additional care plan information) Hematuria on urinalysis in patient with HTN, DM, and BPH  Current Barriers:  [name] Knowledge Deficits related to hematuria . Literacy barriers . Transportation barriers . Cognitive Deficits  Nurse Case Manager Clinical Goal(s):  [name] Over the next 7 days, patient will follow-up with [facility] for a renal/bladder ultrasound  Interventions:  . Chart reviewed . Talked with patient by telephone.  . Confirmed that he is scheduled for a renal/bladder US at [facility] on 11/15/2019 . RN will follow-up with patient over the next 30 days to assess care coordination needs in relationship to hematuria . Patient provided with CCM contact information and encouraged to reach out as needed  Patient Self Care Activities:  . Performs ADL's independently . Performs IADL's independently  Please see past updates related to this goal by clicking on the "Past Updates" button in the selected goal      . Pain Management       Chronic pain in patient with diabetic neuropathy and peripheral vascular disease.   Current Barriers:  . Corporate treasurer.  . Transportation barriers  Nurse Case Manager Clinical Goal(s):  [name] Over the next 90 days, patient will continue to see [facility]  Interventions:  . Chart reviewed . Talked with patient by phone  . Confirmed that patient has a follow-up appointment scheduled with [facility] . Encouraged patient to keep all medical appointments . CCM provided contact information and encouraged patient to reach out as needed  Patient Self Care Activities:  . Performs ADL's independently . Performs IADL's independently  Please see past updates related to this goal by clicking on the "Past Updates" button in the selected goal         The patient verbalized  understanding of instructions provided today and declined a print copy of patient instruction materials.   Follow-up Plan The care management team will reach out to the patient again over the next 30 days.   [name], BSN, RN-BC Embedded Chronic Care Manager [facility] Care Management Direct Dial: [phone]

## 2019-11-13 NOTE — Chronic Care Management (AMB) (Signed)
  Chronic Care Management   Follow Up Note   11/13/2019 Name: Todd Zhang MRN: [id number] DOB: 1946-01-23  Referred by: Remus Loffler, PA-C Reason for referral : Chronic Care Management   [name] is a 74 y.o. year old male who is a primary care patient of Remus Loffler, PA-C. The CCM team was consulted for assistance with chronic disease management and care coordination needs.    Review of patient status, including review of consultants reports, relevant laboratory and other test results, and collaboration with appropriate care team members and the patient's provider was performed as part of comprehensive patient evaluation and provision of chronic care management services.     RN Care Plan           This Visit's Progress   . Hematuria       CARE PLAN ENTRY (see longtitudinal plan of care for additional care plan information) Hematuria on urinalysis in patient with HTN, DM, and BPH  Current Barriers:  [name] Knowledge Deficits related to hematuria . Literacy barriers . Transportation barriers . Cognitive Deficits  Nurse Case Manager Clinical Goal(s):  [name] Over the next 7 days, patient will follow-up with [facility] for a renal/bladder ultrasound  Interventions:  . Chart reviewed . Talked with patient by telephone.  . Confirmed that he is scheduled for a renal/bladder US at [facility] on 11/15/2019 . RN will follow-up with patient over the next 30 days to assess care coordination needs in relationship to hematuria . Patient provided with CCM contact information and encouraged to reach out as needed  Patient Self Care Activities:  . Performs ADL's independently . Performs IADL's independently  Please see past updates related to this goal by clicking on the "Past Updates" button in the selected goal      . Pain Management       Chronic pain in patient with diabetic neuropathy and peripheral vascular disease.   Current Barriers:  . Corporate treasurer.   . Transportation barriers  Nurse Case Manager Clinical Goal(s):  [name] Over the next 90 days, patient will continue to see [facility]  Interventions:  . Chart reviewed . Talked with patient by phone  . Confirmed that patient has a follow-up appointment scheduled with [facility] . Encouraged patient to keep all medical appointments . CCM provided contact information and encouraged patient to reach out as needed  Patient Self Care Activities:  . Performs ADL's independently . Performs IADL's independently  Please see past updates related to this goal by clicking on the "Past Updates" button in the selected goal          Plan:   The care management team will reach out to the patient again over the next 30 days.    [name], BSN, RN-BC Embedded Chronic Care Manager [facility] / [facility] Care Management Direct Dial: [phone]

## 2019-11-16 ENCOUNTER — Ambulatory Visit: Payer: Self-pay | Admitting: Licensed Clinical Social Worker

## 2019-11-16 DIAGNOSIS — K219 Gastro-esophageal reflux disease without esophagitis: Secondary | ICD-10-CM

## 2019-11-16 DIAGNOSIS — Z794 Long term (current) use of insulin: Secondary | ICD-10-CM

## 2019-11-16 DIAGNOSIS — I10 Essential (primary) hypertension: Secondary | ICD-10-CM

## 2019-11-16 DIAGNOSIS — E1140 Type 2 diabetes mellitus with diabetic neuropathy, unspecified: Secondary | ICD-10-CM

## 2019-11-16 DIAGNOSIS — J441 Chronic obstructive pulmonary disease with (acute) exacerbation: Secondary | ICD-10-CM

## 2019-11-16 DIAGNOSIS — I4891 Unspecified atrial fibrillation: Secondary | ICD-10-CM

## 2019-11-16 DIAGNOSIS — M5030 Other cervical disc degeneration, unspecified cervical region: Secondary | ICD-10-CM

## 2019-11-16 DIAGNOSIS — F411 Generalized anxiety disorder: Secondary | ICD-10-CM

## 2019-11-16 NOTE — Patient Instructions (Addendum)
Licensed Clinical Social Worker Visit Information  Goals we discussed today:  Goals    . "I need stable housing" (pt-stated)     Current Barriers:  . Lacks caregiver support.  . Financial Constraints in patient with Chronic Diagnoses of Type 2 DM, HTN, Atrial Fibrillation, GERD, COPD, DDD and GAD . Literacy barriers . transportation  Nurse Case Manager Clinical Goal(s):  [name] Over the next 30 days, patient will work with CCM team to address needs related to homelessness  Interventions:     LCSW has worked with patient regarding housing assistance.  LCSW talked with client about current food supply of client  Talked with client about sleeping challenges of client  Collaborated with RNCM [name] regarding nursing needs of client  Talked with client about pain issues of client  Talked with client about his upcoming medical appointments . Previously  LCSW provided client with phone number for [employer] in [place], [place]  Patient Self Care Activities:  . Performs ADL's independently . Performs IADL's independently  See previous goal updates     Materials Provided: No  Follow Up Plan:LCSW to call client in next 4 weeks to assess the housing needs of client at that time  The patient verbalized understanding of instructions provided today and declined a print copy of patient instruction materials.   Kelton Pillar.Mikaia Janvier MSW, LCSW Licensed Clinical Social Worker [facility] Care Management [phone]

## 2019-11-16 NOTE — Chronic Care Management (AMB) (Signed)
  Care Management Note   DONEVAN BILLER is a 74 y.o. year old male who is a primary care patient of Remus Loffler, PA-C. The CM team was consulted for assistance with chronic disease management and care coordination.   I reached out to [name] by phone today.     Review of patient status, including review of consultants reports, relevant laboratory and other test results, and collaboration with appropriate care team members and the patient's provider was performed as part of comprehensive patient evaluation and provision of chronic care management services.   Social determinants of health:risk of food insecurity; risk of housing needs. Risk of physical inactivity; risk of social isolation; risk of tobacco use .   Chronic Care Management from 06/13/2019 in [facility]  PHQ-9 Total Score  6     GAD 7 : Generalized Anxiety Score 06/13/2019  Nervous, Anxious, on Edge 1  Control/stop worrying 1  Worry too much - different things 1  Trouble relaxing 1  Restless 1  Easily annoyed or irritable 0  Afraid - awful might happen 0  Total GAD 7 Score 5  Anxiety Difficulty Somewhat difficult   Medications   (very important)  New medications from outside sources are available for reconciliation  ACCU-CHEK AVIVA PLUS test strip alendronate (FOSAMAX) 70 MG tablet ALPRAZolam (XANAX) 0.5 MG tablet atorvastatin (LIPITOR) 40 MG tablet benazepril (LOTENSIN) 5 MG tablet gabapentin (NEURONTIN) 300 MG capsule insulin regular (HUMULIN R) 100 units/mL injection Insulin Syringe-Needle U-100 (GLOBAL INJECT EASE INSULIN SYR) 31G X 5/16" 0.3 ML MISC metFORMIN (GLUCOPHAGE) 500 MG tablet metoprolol succinate (TOPROL-XL) 50 MG 24 hr tablet Multiple Vitamin (MULTIVITAMIN) tablet omeprazole (PRILOSEC) 20 MG capsule oxyCODONE-acetaminophen (PERCOCET) 5-325 MG tablet oxyCODONE-acetaminophen (PERCOCET) 5-325 MG tablet oxyCODONE-acetaminophen (PERCOCET/ROXICET) 5-325 MG  tablet tamsulosin (FLOMAX) 0.4 MG CAPS capsule vitamin B-12 (CYANOCOBALAMIN) 500 MCG tablet XARELTO 20 MG TABS tablet  Goals    . "I need stable housing" (pt-stated)     Current Barriers:  . Lacks caregiver support.  . Financial Constraints in patient with Chronic Diagnoses of Type 2 DM, HTN, Atrial Fibrillation, GERD, COPD, DDD and GAD . Literacy barriers . transportation  Nurse Case Manager Clinical Goal(s):  [name] Over the next 30 days, patient will work with CCM team to address needs related to homelessness  Interventions:    LCSW has worked with patient regarding housing assistance.  LCSW talked with client about current food supply of client  Talked with client about sleeping challenges of client  Collaborated with RNCM [name] regarding nursing needs of client  Talked with client about pain issues of client  Talked with client about his upcoming medical appointments  Previously  LCSW provided client with phone number for [employer] in [place], [place]  Patient Self Care Activities:  . Performs ADL's independently . Performs IADL's independently  See previous goal updates       Follow Up Plan:LCSW to call client in next 4 weeks to assess the housing needs of client at that time  Kelton Pillar.Jamaar Howes MSW, LCSW Licensed Clinical Social Worker [facility] Care Management [phone]

## 2019-11-19 ENCOUNTER — Other Ambulatory Visit: Payer: Self-pay | Admitting: Physician Assistant

## 2019-11-19 DIAGNOSIS — Z794 Long term (current) use of insulin: Secondary | ICD-10-CM

## 2019-11-19 DIAGNOSIS — G8929 Other chronic pain: Secondary | ICD-10-CM

## 2019-11-19 DIAGNOSIS — M545 Low back pain, unspecified: Secondary | ICD-10-CM

## 2019-11-19 DIAGNOSIS — M5030 Other cervical disc degeneration, unspecified cervical region: Secondary | ICD-10-CM

## 2019-11-19 DIAGNOSIS — I10 Essential (primary) hypertension: Secondary | ICD-10-CM

## 2019-11-19 DIAGNOSIS — E1140 Type 2 diabetes mellitus with diabetic neuropathy, unspecified: Secondary | ICD-10-CM

## 2019-11-21 ENCOUNTER — Ambulatory Visit: Payer: Medicare Other | Admitting: Physician Assistant

## 2019-11-27 ENCOUNTER — Ambulatory Visit (INDEPENDENT_AMBULATORY_CARE_PROVIDER_SITE_OTHER): Payer: Medicare Other

## 2019-11-27 ENCOUNTER — Other Ambulatory Visit: Payer: Self-pay | Admitting: Physician Assistant

## 2019-11-27 DIAGNOSIS — E1140 Type 2 diabetes mellitus with diabetic neuropathy, unspecified: Secondary | ICD-10-CM

## 2019-11-27 DIAGNOSIS — Z0000 Encounter for general adult medical examination without abnormal findings: Secondary | ICD-10-CM | POA: Diagnosis not present

## 2019-11-27 NOTE — Progress Notes (Signed)
MEDICARE ANNUAL WELLNESS VISIT  11/27/2019  Telephone Visit Disclaimer This Medicare AWV was conducted by telephone due to national recommendations for restrictions regarding the COVID-19 Pandemic (e.g. social distancing).  I verified, using two identifiers, that I am speaking with Todd Zhang or their authorized healthcare agent. I discussed the limitations, risks, security, and privacy concerns of performing an evaluation and management service by telephone and the potential availability of an in-person appointment in the future. The patient expressed understanding and agreed to proceed.   Subjective:  Todd Zhang is a 74 y.o. male patient of Remus Loffler, PA-C who had a Medicare Annual Wellness Visit today via telephone. Atharva is Retired and disabled. He lives alone. he has 2 children. he reports that he is socially active and does interact with friends/family regularly. he is minimally physically active and enjoys woodworking.  Patient Care Team: Caryl Never as PCP - General (Physician Assistant) Valeria Batman, MD as Consulting Physician (Orthopedic Surgery) Adam Phenix, DPM as Consulting Physician (Podiatry) Randa Spike, Kelton Pillar, LCSW as Social Worker (Licensed Clinical Social Worker) Gwenith Daily, RN as Registered Nurse  Advanced Directives 11/22/2018 10/26/2017  Does Patient Have a Medical Advance Directive? No No  Would patient like information on creating a medical advance directive? Yes (MAU/Ambulatory/Procedural Areas - Information given) Yes (ED - Information included in AVS)    Hospital Utilization Over the Past 12 Months: # of hospitalizations or ER visits: 3 # of surgeries: 0  Review of Systems    Patient reports that his overall health is unchanged compared to last year.  Patient Reported Readings   Blood Sugar this morning 110  Pain Assessment     Current Medications & Allergies (verified) Allergies as of 11/27/2019      Reactions   Asa  [aspirin] Hives   Penicillins Hives      Medication List       Accurate as of November 27, 2019 10:16 AM. If you have any questions, ask your nurse or doctor.        Accu-Chek Aviva Plus test strip Generic drug: glucose blood USE TO check blood glucose TWICE DAILY   alendronate 70 MG tablet Commonly known as: FOSAMAX Take 1 tablet (70 mg total) by mouth once a week. Take with a full glass of water on an empty stomach.   ALPRAZolam 0.5 MG tablet Commonly known as: XANAX Take 1 tablet (0.5 mg total) by mouth at bedtime as needed for anxiety.   atorvastatin 40 MG tablet Commonly known as: LIPITOR TAKE 1 TABLET BY MOUTH EVERY DAY   benazepril 5 MG tablet Commonly known as: LOTENSIN TAKE 1 TABLET BY MOUTH DAILY   gabapentin 300 MG capsule Commonly known as: NEURONTIN TAKE 1 CAPSULE BY MOUTH THREE TIMES DAILY FOR PAIN   insulin regular 100 units/mL injection Commonly known as: HumuLIN R INJECT 10 UNITS INTO THE SKIN THREE TIMES DAILY BEFORE MEALS   Insulin Syringe-Needle U-100 31G X 5/16" 0.3 ML Misc Commonly known as: Global Inject Ease Insulin Syr Use with insulin Dx E11.9   metFORMIN 500 MG tablet Commonly known as: GLUCOPHAGE TAKE 1 TABLET BY MOUTH TWICE DAILY   metoprolol succinate 50 MG 24 hr tablet Commonly known as: TOPROL-XL TAKE 1 TABLET BY MOUTH DAILY   multivitamin tablet Take 1 tablet by mouth daily.   omeprazole 20 MG capsule Commonly known as: PRILOSEC TAKE ONE CAPSULE BY MOUTH DAILY   oxyCODONE-acetaminophen 5-325 MG tablet Commonly known  as: Percocet Take 1 tablet by mouth every 4 (four) hours as needed for severe pain. 2 of 3   oxyCODONE-acetaminophen 5-325 MG tablet Commonly known as: Percocet Take 1 tablet by mouth every 6 (six) hours as needed for severe pain. 3 of 3   oxyCODONE-acetaminophen 5-325 MG tablet Commonly known as: PERCOCET/ROXICET Take 1 tablet by mouth every 6 (six) hours as needed for severe pain.   tamsulosin 0.4 MG  Caps capsule Commonly known as: FLOMAX TAKE 1 CAPSULE BY MOUTH EVERY DAY   vitamin B-12 500 MCG tablet Commonly known as: CYANOCOBALAMIN Take 500 mcg by mouth daily.   Xarelto 20 MG Tabs tablet Generic drug: rivaroxaban Take 1 tablet (20 mg total) by mouth daily.       History (reviewed): Past Medical History:  Diagnosis Date  . Atrial fibrillation ([place])   . Cervical spinal stenosis   . DDD (degenerative disc disease), cervical   . DDD (degenerative disc disease), lumbar   . Diabetes mellitus without complication ([place])   . Hyperlipidemia   . Hypertension   . Peripheral artery disease [facility])    Past Surgical History:  Procedure Laterality Date  . AMPUTATION TOE    . KNEE SURGERY Left   . NOSE SURGERY     Family History  Problem Relation Age of Onset  . Stroke Mother   . Heart disease Mother        CABG  . Cancer Father    Social History   Socioeconomic History  . Marital status: Single    Spouse name: Not on file  . Number of children: 2  . Years of education: 65  . Highest education level: 11th grade  Occupational History  . Not on file  Tobacco Use  . Smoking status: Former Smoker    Packs/day: 1.00    Types: Cigarettes    Start date: 06/19/1966    Quit date: 06/20/2011    Years since quitting: 8.4  . Smokeless tobacco: Never Used  Substance and Sexual Activity  . Alcohol use: No    Comment: quit drinking in 1982  . Drug use: Not Currently    Comment: marijuana and cocaine in the past, quit in 2012  . Sexual activity: Not on file  Other Topics Concern  . Not on file  Social History Narrative  . Not on file   Social Determinants of Health   Financial Resource Strain:   . Difficulty of Paying Living Expenses:   Food Insecurity: Food Insecurity Present  . Worried About Charity fundraiser in the Last Year: Sometimes true  . Ran Out of Food in the Last Year: Not on file  Transportation Needs:   . Lack of Transportation (Medical):   [name] Lack of  Transportation (Non-Medical):   Physical Activity:   . Days of Exercise per Week:   . Minutes of Exercise per Session:   Stress:   . Feeling of Stress :   Social Connections:   . Frequency of Communication with Friends and Family:   . Frequency of Social Gatherings with Friends and Family:   . Attends Religious Services:   . Active Member of Clubs or Organizations:   . Attends Archivist Meetings:   [name] Marital Status:     Activities of Daily Living In your present state of health, do you have any difficulty performing the following activities: 05/24/2019  Hearing? Y  Comment has noticed some decrease in hearing but did not have difficulty during telephone  call  Vision? N  Comment hasn't had an eye exam recently  Difficulty concentrating or making decisions? N  Walking or climbing stairs? Y  Comment some truoble due to peripheral neuropathy  Dressing or bathing? N  Doing errands, shopping? N  Some recent data might be hidden    Patient Education/ Literacy    Exercise    Diet Patient reports consuming 2 meals a day and 2 snack(s) a day Patient reports that his primary diet is: Regular Patient reports that she does have regular access to food.   Depression Screen PHQ 2/9 Scores 08/24/2019 06/13/2019 05/24/2019 05/14/2019 04/02/2019 11/22/2018 11/06/2018  PHQ - 2 Score 0 2 1 0 0 0 0  PHQ- 9 Score - 6 - - - - -     Fall Risk Fall Risk  08/24/2019 05/24/2019 05/14/2019 04/02/2019 11/22/2018  Falls in the past year? 1 1 1 1  0  Number falls in past yr: 0 1 1 1  0  Injury with Fall? 0 0 0 1 -  Risk Factor Category  - - - - -  Risk for fall due to : - Impaired balance/gait;Impaired mobility;Other (Comment);History of fall(s) History of fall(s) - Impaired balance/gait  Risk for fall due to: Comment - lower extremity neuropathy - - -  Follow up Falls prevention discussed Falls prevention discussed - - Education provided;Falls prevention discussed     Objective:  LYNTON CRESCENZO seemed alert and oriented and he participated appropriately during our telephone visit.  Blood Pressure Weight BMI  BP Readings from Last 3 Encounters:  08/24/19 106/61  05/14/19 113/65  04/02/19 (!) 141/73   Wt Readings from Last 3 Encounters:  08/24/19 210 lb (95.3 kg)  05/14/19 221 lb 6.4 oz (100.4 kg)  04/02/19 224 lb (101.6 kg)   BMI Readings from Last 1 Encounters:  08/24/19 28.48 kg/m    *Unable to obtain current vital signs, weight, and BMI due to telephone visit type  Hearing/Vision  . Othniel did not seem to have difficulty with hearing/understanding during the telephone conversation . Reports that he has not had a formal eye exam by an eye care professional within the past year . Reports that he has not had a formal hearing evaluation within the past year *Unable to fully assess hearing and vision during telephone visit type  Cognitive Function: 6CIT Screen 11/22/2018  What Year? 0 points  What month? 0 points  What time? 0 points  Count back from 20 0 points  Months in reverse 0 points  Repeat phrase 4 points  Total Score 4   (Normal:0-7, Significant for Dysfunction: >8)  Normal Cognitive Function Screening: No  Immunization & Health Maintenance Record Immunization History  Administered Date(s) Administered  . Influenza, High Dose Seasonal PF 05/31/2017, 04/26/2019  . Influenza,inj,Quad PF,6+ Mos 06/19/2014  . Influenza,inj,quad, With Preservative 05/23/2018  . Pneumococcal Conjugate-13 05/12/2015  . Pneumococcal Polysaccharide-23 10/26/2017  . Tdap 05/12/2015  . Zoster 05/20/2015    Health Maintenance  Topic Date Due  . FOOT EXAM  Never done  . Hepatitis C Screening  08/23/2020 (Originally October 06, 1945)  . HEMOGLOBIN A1C  02/22/2020  . COLONOSCOPY  06/19/2020  . OPHTHALMOLOGY EXAM  08/13/2020  . TETANUS/TDAP  05/11/2025  . INFLUENZA VACCINE  Completed  . PNA vac Low Risk Adult  Completed       Assessment  This is a routine wellness  examination for 14/09/2019.  Health Maintenance: Due or Overdue Health Maintenance Due  Topic Date Due  .  FOOT EXAM  Never done    Todd Zhang does not need a referral for Community Assistance: Care Management:   no Social Work:    no Prescription Assistance:  no Nutrition/Diabetes Education:  no   Plan:  Personalized Goals  Pain Management  Stable Housing  Manage Medications  Personalized Health Maintenance & Screening Recommendations   Pt has an appointment in [place] for diabetic shoes.  Lung Cancer Screening Recommended: no (Low Dose CT Chest recommended if Age 23-80 years, 30 pack-year currently smoking OR have quit w/in past 15 years) Hepatitis C Screening recommended: no HIV Screening recommended: no  Advanced Directives: Written information was not prepared per patient's request.  Referrals & Orders No orders of the defined types were placed in this encounter.   Follow-up Plan . Follow-up with Remus Loffler, PA-C as planned . Scheduled 12/24/2019    I have personally reviewed and noted the following in the patient's chart:   . Medical and social history . Use of alcohol, tobacco or illicit drugs  . Current medications and supplements . Functional ability and status . Nutritional status . Physical activity . Advanced directives . List of other physicians . Hospitalizations, surgeries, and ER visits in previous 12 months . Vitals . Screenings to include cognitive, depression, and falls . Referrals and appointments  In addition, I have reviewed and discussed with Todd Zhang certain preventive protocols, quality metrics, and best practice recommendations. A written personalized care plan for preventive services as well as general preventive health recommendations is available and can be mailed to the patient at his request.      Suzan Slick [facility]  1/61/0960

## 2019-12-12 ENCOUNTER — Telehealth: Payer: Medicare Other

## 2019-12-17 ENCOUNTER — Ambulatory Visit (INDEPENDENT_AMBULATORY_CARE_PROVIDER_SITE_OTHER): Payer: Medicare Other | Admitting: Licensed Clinical Social Worker

## 2019-12-17 ENCOUNTER — Ambulatory Visit: Payer: Medicare Other | Admitting: Urology

## 2019-12-17 DIAGNOSIS — I4891 Unspecified atrial fibrillation: Secondary | ICD-10-CM

## 2019-12-17 DIAGNOSIS — E1140 Type 2 diabetes mellitus with diabetic neuropathy, unspecified: Secondary | ICD-10-CM

## 2019-12-17 DIAGNOSIS — J441 Chronic obstructive pulmonary disease with (acute) exacerbation: Secondary | ICD-10-CM | POA: Diagnosis not present

## 2019-12-17 DIAGNOSIS — Z794 Long term (current) use of insulin: Secondary | ICD-10-CM | POA: Diagnosis not present

## 2019-12-17 DIAGNOSIS — K219 Gastro-esophageal reflux disease without esophagitis: Secondary | ICD-10-CM

## 2019-12-17 DIAGNOSIS — M5030 Other cervical disc degeneration, unspecified cervical region: Secondary | ICD-10-CM

## 2019-12-17 DIAGNOSIS — F411 Generalized anxiety disorder: Secondary | ICD-10-CM

## 2019-12-17 NOTE — Chronic Care Management (AMB) (Signed)
  Care Management Note   Todd Zhang is a 74 y.o. year old male who is a primary care patient of Dettinger, Ivin Booty. The CM team was consulted for assistance with chronic disease management and care coordination.   I reached out to [name] by phone today   Review of patient status, including review of consultants reports, relevant laboratory and other test results, and collaboration with appropriate care team members and the patient's provider was performed as part of comprehensive patient evaluation and provision of chronic care management services.   Social determinants of health: risk of tobacco use; risk depression; risk of food insecurity; risk of housing needs    Chronic Care Management from 06/13/2019 in [facility]  PHQ-9 Total Score  6     GAD 7 : Generalized Anxiety Score 06/13/2019  Nervous, Anxious, on Edge 1  Control/stop worrying 1  Worry too much - different things 1  Trouble relaxing 1  Restless 1  Easily annoyed or irritable 0  Afraid - awful might happen 0  Total GAD 7 Score 5  Anxiety Difficulty Somewhat difficult   Medications   (very important)  New medications from outside sources are available for reconciliation  ACCU-CHEK AVIVA PLUS test strip alendronate (FOSAMAX) 70 MG tablet ALPRAZolam (XANAX) 0.5 MG tablet atorvastatin (LIPITOR) 40 MG tablet benazepril (LOTENSIN) 5 MG tablet gabapentin (NEURONTIN) 300 MG capsule insulin regular (HUMULIN R) 100 units/mL injection Insulin Syringe-Needle U-100 (GLOBAL INJECT EASE INSULIN SYR) 31G X 5/16" 0.3 ML MISC metFORMIN (GLUCOPHAGE) 500 MG tablet metoprolol succinate (TOPROL-XL) 50 MG 24 hr tablet Multiple Vitamin (MULTIVITAMIN) tablet omeprazole (PRILOSEC) 20 MG capsule oxyCODONE-acetaminophen (PERCOCET) 5-325 MG tablet oxyCODONE-acetaminophen (PERCOCET) 5-325 MG tablet oxyCODONE-acetaminophen (PERCOCET/ROXICET) 5-325 MG tablet tamsulosin (FLOMAX) 0.4 MG CAPS capsule vitamin  B-12 (CYANOCOBALAMIN) 500 MCG tablet XARELTO 20 MG TABS tablet  Goals    . "I need stable housing" (pt-stated)     Current Barriers:  . Lacks caregiver support.  . Financial Constraints in patient with Chronic Diagnoses of Type 2 DM, HTN, Atrial Fibrillation, GERD, COPD, DDD and GAD . Literacy barriers . transportation  Nurse Case Manager Clinical Goal(s):  Todd Zhang Kitchen Over the next 30 days, patient will work with CCM team to address needs related to homelessness  Interventions:     LCSW has worked with patient regarding housing assistance.  LCSW talked with client about current food supply of client  Talked with client about sleeping challenges of client  Collaborated with RNCM [name] regarding nursing needs of client  Talked with client about pain issues of client  Talked with client about his upcoming medical appointments . Previously  LCSW provided client with phone number for [employer] in [place], [place] . Talked with client about his recent application to housing complex in [place], [place] . Talked with client about his upcoming appointment regarding diabetic shoes  Patient Self Care Activities:  . Performs ADL's independently . Performs IADL's independently  See previous goal updates     Follow Up Plan:LCSW to call client in next 4 weeks to assess the housing needs of client at that time  Kelton Pillar.Sylar Voong MSW, LCSW Licensed Clinical Social Worker [facility] Care Management [phone]

## 2019-12-17 NOTE — Patient Instructions (Addendum)
Licensed Clinical Social Worker Visit Information  Goals we discussed today:  Goals    . "I need stable housing" (pt-stated)     Current Barriers:  . Lacks caregiver support.  . Financial Constraints in patient with Chronic Diagnoses of Type 2 DM, HTN, Atrial Fibrillation, GERD, COPD, DDD and GAD . Literacy barriers . transportation  Nurse Case Manager Clinical Goal(s):  [name] Over the next 30 days, patient will work with CCM team to address needs related to homelessness  Interventions:  LCSW has worked with patient regarding housing assistance.  LCSW talked with client about current food supply of client  Talked with client about sleeping challenges of client  Collaborated with RNCM [name] regarding nursing needs of client Talked with client about pain issues of client Talked with client about his upcoming medical appointments Previously LCSW provided client with phone number for [employer] in [place], [place]  Talked with client about his recent application to housing complex in [place], [place]  Talked with client about his upcoming appointment regarding diabetic shoes  Patient Self Care Activities:  . Performs ADL's independently . Performs IADL's independently  See previous goal updates   Materials Provided: No  Follow Up Plan:LCSW to call client in next 4 weeks to assess the housing needs of client at that time  The patient verbalized understanding of instructions provided today and declined a print copy of patient instruction materials.   Kelton Pillar.Orvetta Danielski MSW, LCSW Licensed Clinical Social Worker [facility] Care Management [phone]

## 2019-12-18 ENCOUNTER — Other Ambulatory Visit: Payer: Self-pay | Admitting: *Deleted

## 2019-12-18 DIAGNOSIS — E1140 Type 2 diabetes mellitus with diabetic neuropathy, unspecified: Secondary | ICD-10-CM

## 2019-12-18 DIAGNOSIS — Z794 Long term (current) use of insulin: Secondary | ICD-10-CM

## 2019-12-18 DIAGNOSIS — I10 Essential (primary) hypertension: Secondary | ICD-10-CM

## 2019-12-18 MED ORDER — ALENDRONATE SODIUM 70 MG PO TABS: 70.0000 mg | ORAL_TABLET | ORAL | 0 refills | Status: DC

## 2019-12-18 MED ORDER — OMEPRAZOLE 20 MG PO CPDR: 20.0000 mg | DELAYED_RELEASE_CAPSULE | Freq: Every day | ORAL | 0 refills | Status: DC

## 2019-12-18 MED ORDER — METOPROLOL SUCCINATE ER 50 MG PO TB24: 50.0000 mg | ORAL_TABLET | Freq: Every day | ORAL | 0 refills | Status: DC

## 2019-12-18 MED ORDER — METFORMIN HCL 500 MG PO TABS: 500.0000 mg | ORAL_TABLET | Freq: Two times a day (BID) | ORAL | 0 refills | Status: DC

## 2019-12-20 ENCOUNTER — Other Ambulatory Visit: Payer: Self-pay | Admitting: *Deleted

## 2019-12-20 DIAGNOSIS — E1140 Type 2 diabetes mellitus with diabetic neuropathy, unspecified: Secondary | ICD-10-CM

## 2019-12-20 MED ORDER — "INSULIN SYRINGE-NEEDLE U-100 31G X 5/16"" 0.3 ML MISC": 3 refills | Status: DC

## 2019-12-20 MED ORDER — INSULIN REGULAR HUMAN 100 UNIT/ML IJ SOLN: INTRAMUSCULAR | 0 refills | Status: DC

## 2019-12-24 ENCOUNTER — Encounter: Payer: Self-pay | Admitting: General Practice

## 2019-12-24 ENCOUNTER — Ambulatory Visit: Payer: Medicare Other | Admitting: Physician Assistant

## 2019-12-24 ENCOUNTER — Ambulatory Visit: Payer: Medicare Other | Admitting: Nurse Practitioner

## 2019-12-31 ENCOUNTER — Ambulatory Visit: Payer: Medicare Other | Admitting: Urology

## 2020-01-01 ENCOUNTER — Ambulatory Visit: Payer: Medicare Other | Admitting: *Deleted

## 2020-01-02 NOTE — Chronic Care Management (AMB) (Addendum)
  Chronic Care Management   Follow-up Note  01/01/2020 Name: Todd Zhang MRN: [id number] DOB: February 21, 1946  Referred by: No primary care provider on file. Reason for referral : Chronic Care Management (RN follow up)   An unsuccessful telephone follow-up was attempted today. The patient was referred to the case management team for assistance with care management and care coordination.   Follow Up Plan: The care management team will reach out to the patient again over the next 30 days.   [name], BSN, RN-BC Embedded Chronic Care Manager [facility] / [facility] Care Management Direct Dial: [phone]

## 2020-01-09 ENCOUNTER — Ambulatory Visit: Payer: Medicare Other | Admitting: Urology

## 2020-01-10 ENCOUNTER — Other Ambulatory Visit: Payer: Self-pay

## 2020-01-10 ENCOUNTER — Encounter: Payer: Self-pay | Admitting: Nurse Practitioner

## 2020-01-10 ENCOUNTER — Ambulatory Visit (INDEPENDENT_AMBULATORY_CARE_PROVIDER_SITE_OTHER): Payer: Medicare Other | Admitting: Nurse Practitioner

## 2020-01-10 VITALS — BP 105/61 | HR 91 | Temp 98.1°F | Ht 72.0 in | Wt 217.0 lb

## 2020-01-10 DIAGNOSIS — N401 Enlarged prostate with lower urinary tract symptoms: Secondary | ICD-10-CM

## 2020-01-10 DIAGNOSIS — E1140 Type 2 diabetes mellitus with diabetic neuropathy, unspecified: Secondary | ICD-10-CM

## 2020-01-10 DIAGNOSIS — K219 Gastro-esophageal reflux disease without esophagitis: Secondary | ICD-10-CM

## 2020-01-10 DIAGNOSIS — I482 Chronic atrial fibrillation, unspecified: Secondary | ICD-10-CM

## 2020-01-10 DIAGNOSIS — J441 Chronic obstructive pulmonary disease with (acute) exacerbation: Secondary | ICD-10-CM

## 2020-01-10 DIAGNOSIS — M545 Low back pain, unspecified: Secondary | ICD-10-CM

## 2020-01-10 DIAGNOSIS — M5030 Other cervical disc degeneration, unspecified cervical region: Secondary | ICD-10-CM

## 2020-01-10 DIAGNOSIS — I10 Essential (primary) hypertension: Secondary | ICD-10-CM | POA: Diagnosis not present

## 2020-01-10 DIAGNOSIS — R3914 Feeling of incomplete bladder emptying: Secondary | ICD-10-CM

## 2020-01-10 DIAGNOSIS — Z6828 Body mass index (BMI) 28.0-28.9, adult: Secondary | ICD-10-CM

## 2020-01-10 DIAGNOSIS — I4891 Unspecified atrial fibrillation: Secondary | ICD-10-CM

## 2020-01-10 DIAGNOSIS — E7800 Pure hypercholesterolemia, unspecified: Secondary | ICD-10-CM

## 2020-01-10 DIAGNOSIS — F411 Generalized anxiety disorder: Secondary | ICD-10-CM

## 2020-01-10 DIAGNOSIS — I739 Peripheral vascular disease, unspecified: Secondary | ICD-10-CM

## 2020-01-10 DIAGNOSIS — G8929 Other chronic pain: Secondary | ICD-10-CM

## 2020-01-10 DIAGNOSIS — Z794 Long term (current) use of insulin: Secondary | ICD-10-CM

## 2020-01-10 LAB — BAYER DCA HB A1C WAIVED: HB A1C (BAYER DCA - WAIVED): 7.1 % — ABNORMAL HIGH (ref <7.0)

## 2020-01-10 MED ORDER — ALENDRONATE SODIUM 70 MG PO TABS: 70.0000 mg | ORAL_TABLET | ORAL | 0 refills | Status: DC

## 2020-01-10 MED ORDER — OMEPRAZOLE 20 MG PO CPDR: 20.0000 mg | DELAYED_RELEASE_CAPSULE | Freq: Every day | ORAL | 0 refills | Status: DC

## 2020-01-10 MED ORDER — TAMSULOSIN HCL 0.4 MG PO CAPS: 0.4000 mg | ORAL_CAPSULE | Freq: Every day | ORAL | 1 refills | Status: DC

## 2020-01-10 MED ORDER — METOPROLOL SUCCINATE ER 50 MG PO TB24: 50.0000 mg | ORAL_TABLET | Freq: Every day | ORAL | 0 refills | Status: DC

## 2020-01-10 MED ORDER — INSULIN REGULAR HUMAN 100 UNIT/ML IJ SOLN: INTRAMUSCULAR | 5 refills | Status: DC

## 2020-01-10 MED ORDER — GABAPENTIN 300 MG PO CAPS: ORAL_CAPSULE | ORAL | 5 refills | Status: DC

## 2020-01-10 MED ORDER — BENAZEPRIL HCL 5 MG PO TABS: 5.0000 mg | ORAL_TABLET | Freq: Every day | ORAL | 1 refills | Status: DC

## 2020-01-10 MED ORDER — METFORMIN HCL 500 MG PO TABS: 500.0000 mg | ORAL_TABLET | Freq: Two times a day (BID) | ORAL | 0 refills | Status: DC

## 2020-01-10 MED ORDER — PRAVASTATIN SODIUM 40 MG PO TABS: 40.0000 mg | ORAL_TABLET | Freq: Every day | ORAL | 1 refills | Status: DC

## 2020-01-10 MED ORDER — FENOFIBRATE 145 MG PO TABS: 145.0000 mg | ORAL_TABLET | Freq: Every day | ORAL | 1 refills | Status: DC

## 2020-01-10 MED ORDER — XARELTO 20 MG PO TABS: 20.0000 mg | ORAL_TABLET | Freq: Every day | ORAL | 5 refills | Status: DC

## 2020-01-10 NOTE — Progress Notes (Signed)
Subjective:    Patient ID: Todd Zhang, male    DOB: [birth year], 74 y.o.   MRN: [id number]   Chief Complaint: Medical Management of Chronic Issues    HPI:  1. Type 2 diabetes mellitus with diabetic neuropathy, with long-term current use of insulin (HCC) Fasting blood sugars at home are running form 90-130. He denies anny low blood sugars. Lab Results  Component Value Date   HGBA1C 6.1 08/24/2019     2. DDD (degenerative disc disease), cervical Pain assessment: He is gong to pain management but does not like it. I told him to stay with pain management.  3. Essential hypertension No c/o chest pain, sob or headache. Does not check blood pressure at home. BP Readings from Last 3 Encounters:  01/10/20 105/61  08/24/19 106/61  05/14/19 113/65      4. Peripheral artery disease ([place]) He says legs stay cold an numb all th etime  5. Atrial fibrillation, unspecified type ([place]) Is on eliquis and is doing well. He denies any bleeding. He denies haert racing and palpitations  6. COPD exacerbation ([place]) He has frequent cough. He refuses to use inhaler because it gags him.   7. Gastroesophageal reflux disease without esophagitis Is on omeprazole daily and still has symptoms. He doe snot want to change meds  8. Generalized anxiety disorder Has not been taking xanax because he is on pain meds. Stays anxious. GAD 7 : Generalized Anxiety Score 06/13/2019  Nervous, Anxious, on Edge 1  Control/stop worrying 1  Worry too much - different things 1  Trouble relaxing 1  Restless 1  Easily annoyed or irritable 0  Afraid - awful might happen 0  Total GAD 7 Score 5  Anxiety Difficulty Somewhat difficult     9. BMI 28.0-28.9,adult No recent weight changes Wt Readings from Last 3 Encounters:  01/10/20 217 lb (98.4 kg)  08/24/19 210 lb (95.3 kg)  05/14/19 221 lb 6.4 oz (100.4 kg)   BMI Readings from Last 3 Encounters:  01/10/20 29.43 kg/m  08/24/19 28.48 kg/m  05/14/19  30.03 kg/m   10. BPH Takes flomax and denies anny problems voiding  Outpatient Encounter Medications as of 01/10/2020  Medication Sig  . ACCU-CHEK AVIVA PLUS test strip USE TO check blood glucose TWICE DAILY  . alendronate (FOSAMAX) 70 MG tablet Take 1 tablet (70 mg total) by mouth once a week. Take with a full glass of water on an empty stomach.  . benazepril (LOTENSIN) 5 MG tablet TAKE 1 TABLET BY MOUTH DAILY  . fenofibrate (TRICOR) 145 MG tablet Take 145 mg by mouth daily.  [name] gabapentin (NEURONTIN) 300 MG capsule TAKE 1 CAPSULE BY MOUTH THREE TIMES DAILY FOR PAIN  . insulin regular (HUMULIN R) 100 units/mL injection INJECT 10 UNITS INTO THE SKIN THREE TIMES DAILY BEFORE MEALS  . Insulin Syringe-Needle U-100 (GLOBAL INJECT EASE INSULIN SYR) 31G X 5/16" 0.3 ML MISC Use with insulin Dx E11.9  . metFORMIN (GLUCOPHAGE) 500 MG tablet Take 1 tablet (500 mg total) by mouth 2 (two) times daily.  . metoprolol succinate (TOPROL-XL) 50 MG 24 hr tablet Take 1 tablet (50 mg total) by mouth daily. Take with or immediately following a meal.  . Multiple Vitamin (MULTIVITAMIN) tablet Take 1 tablet by mouth daily.  [name] omeprazole (PRILOSEC) 20 MG capsule Take 1 capsule (20 mg total) by mouth daily.  [name] oxyCODONE-acetaminophen (PERCOCET) 5-325 MG tablet Take 1 tablet by mouth every 4 (four) hours as needed for severe pain.  2 of 3  . oxyCODONE-acetaminophen (PERCOCET) 5-325 MG tablet Take 1 tablet by mouth every 6 (six) hours as needed for severe pain. 3 of 3  . oxyCODONE-acetaminophen (PERCOCET/ROXICET) 5-325 MG tablet Take 1 tablet by mouth every 6 (six) hours as needed for severe pain.  . tamsulosin (FLOMAX) 0.4 MG CAPS capsule TAKE 1 CAPSULE BY MOUTH EVERY DAY  . vitamin B-12 (CYANOCOBALAMIN) 500 MCG tablet Take 500 mcg by mouth daily.  Alveda Reasons 20 MG TABS tablet Take 1 tablet (20 mg total) by mouth daily.  . pravastatin (PRAVACHOL) 40 MG tablet Take 40 mg by mouth daily.     Past Surgical History:   Procedure Laterality Date  . AMPUTATION TOE    . KNEE SURGERY Left   . NOSE SURGERY      Family History  Problem Relation Age of Onset  . Stroke Mother   . Heart disease Mother        CABG  . Cancer Father     New complaints: None today  Social history: Patient is homeless and is living with a friend. He is waiting for  Low income housing to open up so he can move in. Cannot read or write- so does not know what meds he is on. Controlled substance contract: goes to pain management    Review of Systems  Constitutional: Negative for diaphoresis.  Eyes: Negative for pain.  Respiratory: Negative for shortness of breath.   Cardiovascular: Negative for chest pain, palpitations and leg swelling.  Gastrointestinal: Negative for abdominal pain.  Endocrine: Negative for polydipsia.  Skin: Negative for rash.  Neurological: Negative for dizziness, weakness and headaches.  Hematological: Does not bruise/bleed easily.  All other systems reviewed and are negative.      Objective:   Physical Exam Vitals and nursing note reviewed.  Constitutional:      Appearance: Normal appearance. He is well-developed.  HENT:     Head: Normocephalic.     Nose: Nose normal.  Eyes:     Pupils: Pupils are equal, round, and reactive to light.  Neck:     Thyroid: No thyroid mass or thyromegaly.     Vascular: No carotid bruit or JVD.     Trachea: Phonation normal.  Cardiovascular:     Rate and Rhythm: Normal rate and regular rhythm.  Pulmonary:     Effort: Pulmonary effort is normal. No respiratory distress.     Breath sounds: Normal breath sounds.  Abdominal:     General: Bowel sounds are normal.     Palpations: Abdomen is soft.     Tenderness: There is no abdominal tenderness.  Musculoskeletal:        General: Normal range of motion.     Cervical back: Normal range of motion and neck supple.  Lymphadenopathy:     Cervical: No cervical adenopathy.  Skin:    General: Skin is warm and  dry.  Neurological:     Mental Status: He is alert and oriented to person, place, and time.  Psychiatric:        Behavior: Behavior normal.        Thought Content: Thought content normal.        Judgment: Judgment normal.     BP 105/61   Pulse 91   Temp 98.1 F (36.7 C) (Temporal)   Ht 6' (1.829 m)   Wt 217 lb (98.4 kg)   BMI 29.43 kg/m   hgba1c 7.1%      Assessment & Plan:  Todd Zhang comes in today with chief complaint of Medical Management of Chronic Issues   Diagnosis and orders addressed:  1. Type 2 diabetes mellitus with diabetic neuropathy, with long-term current use of insulin (HCC) Continue to watch carbs in diet - Bayer DCA Hb A1c Waived - insulin regular (HUMULIN R) 100 units/mL injection; INJECT 10 UNITS INTO THE SKIN THREE TIMES DAILY BEFORE MEALS  Dispense: 10 mL; Refill: 5 - metFORMIN (GLUCOPHAGE) 500 MG tablet; Take 1 tablet (500 mg total) by mouth 2 (two) times daily.  Dispense: 60 tablet; Refill: 0  2. DDD (degenerative disc disease), cervical Continue with pain management - gabapentin (NEURONTIN) 300 MG capsule; TAKE 1 CAPSULE BY MOUTH THREE TIMES DAILY FOR PAIN  Dispense: 90 capsule; Refill: 5  3. Essential hypertension Low sodium diet - CBC with Differential/Platelet - CMP14+EGFR - Lipid panel - metoprolol succinate (TOPROL-XL) 50 MG 24 hr tablet; Take 1 tablet (50 mg total) by mouth daily. Take with or immediately following a meal.  Dispense: 30 tablet; Refill: 0 - benazepril (LOTENSIN) 5 MG tablet; Take 1 tablet (5 mg total) by mouth daily.  Dispense: 90 tablet; Refill: 1  4. Peripheral artery disease (HCC) Keep legs warm  5. Chronic atrial fibrillation (HCC) Avoid caffeine - XARELTO 20 MG TABS tablet; Take 1 tablet (20 mg total) by mouth daily.  Dispense: 30 tablet; Refill: 5  6. COPD exacerbation (HCC) Avoid allergens Refuses inhaler  7. Gastroesophageal reflux disease without esophagitis Avoid spicy foods Do not eat 2 hours  prior to bedtime - omeprazole (PRILOSEC) 20 MG capsule; Take 1 capsule (20 mg total) by mouth daily.  Dispense: 30 capsule; Refill: 0  8. Generalized anxiety disorder stress management  9. BMI 28.0-28.9,adult Discussed diet and exercise for person with BMI >25 Will recheck weight in 3-6 months  10. Benign prostatic hyperplasia with incomplete bladder emptying - tamsulosin (FLOMAX) 0.4 MG CAPS capsule; Take 1 capsule (0.4 mg total) by mouth daily.  Dispense: 90 capsule; Refill: 1   11. Pure hypercholesterolemia Low fat diet - pravastatin (PRAVACHOL) 40 MG tablet; Take 1 tablet (40 mg total) by mouth daily.  Dispense: 90 tablet; Refill: 1 - fenofibrate (TRICOR) 145 MG tablet; Take 1 tablet (145 mg total) by mouth daily.  Dispense: 90 tablet; Refill: 1   Labs pending Health Maintenance reviewed Diet and exercise encouraged  Follow up plan: 3 months   [name] [name], FNP

## 2020-01-11 LAB — CBC WITH DIFFERENTIAL/PLATELET
Basophils Absolute: 0 10*3/uL (ref 0.0–0.2)
Basos: 0 %
EOS (ABSOLUTE): 0.1 10*3/uL (ref 0.0–0.4)
Eos: 1 %
Hematocrit: 36.5 % — ABNORMAL LOW (ref 37.5–51.0)
Hemoglobin: 12.7 g/dL — ABNORMAL LOW (ref 13.0–17.7)
Immature Grans (Abs): 0 10*3/uL (ref 0.0–0.1)
Immature Granulocytes: 0 %
Lymphocytes Absolute: 2.4 10*3/uL (ref 0.7–3.1)
Lymphs: 30 %
MCH: 31.1 pg (ref 26.6–33.0)
MCHC: 34.8 g/dL (ref 31.5–35.7)
MCV: 90 fL (ref 79–97)
Monocytes Absolute: 0.5 10*3/uL (ref 0.1–0.9)
Monocytes: 6 %
Neutrophils Absolute: 5 10*3/uL (ref 1.4–7.0)
Neutrophils: 63 %
Platelets: 178 10*3/uL (ref 150–450)
RBC: 4.08 x10E6/uL — ABNORMAL LOW (ref 4.14–5.80)
RDW: 15.2 % (ref 11.6–15.4)
WBC: 8.1 10*3/uL (ref 3.4–10.8)

## 2020-01-11 LAB — CMP14+EGFR
ALT: 17 IU/L (ref 0–44)
AST: 23 IU/L (ref 0–40)
Albumin/Globulin Ratio: 2 (ref 1.2–2.2)
Albumin: 4.3 g/dL (ref 3.7–4.7)
Alkaline Phosphatase: 35 IU/L — ABNORMAL LOW (ref 39–117)
BUN/Creatinine Ratio: 20 (ref 10–24)
BUN: 40 mg/dL — ABNORMAL HIGH (ref 8–27)
Bilirubin Total: 0.3 mg/dL (ref 0.0–1.2)
CO2: 22 mmol/L (ref 20–29)
Calcium: 9.5 mg/dL (ref 8.6–10.2)
Chloride: 102 mmol/L (ref 96–106)
Creatinine, Ser: 1.99 mg/dL — ABNORMAL HIGH (ref 0.76–1.27)
GFR calc Af Amer: 37 mL/min/{1.73_m2} — ABNORMAL LOW (ref >59)
GFR calc non Af Amer: 32 mL/min/{1.73_m2} — ABNORMAL LOW (ref >59)
Globulin, Total: 2.2 g/dL (ref 1.5–4.5)
Glucose: 139 mg/dL — ABNORMAL HIGH (ref 65–99)
Potassium: 4.9 mmol/L (ref 3.5–5.2)
Sodium: 138 mmol/L (ref 134–144)
Total Protein: 6.5 g/dL (ref 6.0–8.5)

## 2020-01-11 LAB — LIPID PANEL
Chol/HDL Ratio: 3.8 ratio (ref 0.0–5.0)
Cholesterol, Total: 168 mg/dL (ref 100–199)
HDL: 44 mg/dL (ref >39)
LDL Chol Calc (NIH): 86 mg/dL (ref 0–99)
Triglycerides: 224 mg/dL — ABNORMAL HIGH (ref 0–149)
VLDL Cholesterol Cal: 38 mg/dL (ref 5–40)

## 2020-01-15 ENCOUNTER — Other Ambulatory Visit: Payer: Self-pay

## 2020-01-15 DIAGNOSIS — N289 Disorder of kidney and ureter, unspecified: Secondary | ICD-10-CM

## 2020-01-16 ENCOUNTER — Other Ambulatory Visit: Payer: Self-pay

## 2020-01-16 ENCOUNTER — Ambulatory Visit (INDEPENDENT_AMBULATORY_CARE_PROVIDER_SITE_OTHER): Payer: Medicare Other | Admitting: Licensed Clinical Social Worker

## 2020-01-16 ENCOUNTER — Other Ambulatory Visit: Payer: Medicare Other

## 2020-01-16 DIAGNOSIS — E1140 Type 2 diabetes mellitus with diabetic neuropathy, unspecified: Secondary | ICD-10-CM

## 2020-01-16 DIAGNOSIS — I10 Essential (primary) hypertension: Secondary | ICD-10-CM

## 2020-01-16 DIAGNOSIS — K219 Gastro-esophageal reflux disease without esophagitis: Secondary | ICD-10-CM

## 2020-01-16 DIAGNOSIS — F411 Generalized anxiety disorder: Secondary | ICD-10-CM

## 2020-01-16 DIAGNOSIS — I4891 Unspecified atrial fibrillation: Secondary | ICD-10-CM

## 2020-01-16 DIAGNOSIS — N289 Disorder of kidney and ureter, unspecified: Secondary | ICD-10-CM

## 2020-01-16 DIAGNOSIS — M5030 Other cervical disc degeneration, unspecified cervical region: Secondary | ICD-10-CM

## 2020-01-16 DIAGNOSIS — J441 Chronic obstructive pulmonary disease with (acute) exacerbation: Secondary | ICD-10-CM

## 2020-01-16 LAB — CMP14+EGFR
ALT: 16 IU/L (ref 0–44)
AST: 17 IU/L (ref 0–40)
Albumin/Globulin Ratio: 2 (ref 1.2–2.2)
Albumin: 4.2 g/dL (ref 3.7–4.7)
Alkaline Phosphatase: 36 IU/L — ABNORMAL LOW (ref 39–117)
BUN/Creatinine Ratio: 21 (ref 10–24)
BUN: 39 mg/dL — ABNORMAL HIGH (ref 8–27)
Bilirubin Total: 0.3 mg/dL (ref 0.0–1.2)
CO2: 21 mmol/L (ref 20–29)
Calcium: 9.5 mg/dL (ref 8.6–10.2)
Chloride: 105 mmol/L (ref 96–106)
Creatinine, Ser: 1.82 mg/dL — ABNORMAL HIGH (ref 0.76–1.27)
GFR calc Af Amer: 41 mL/min/{1.73_m2} — ABNORMAL LOW (ref >59)
GFR calc non Af Amer: 36 mL/min/{1.73_m2} — ABNORMAL LOW (ref >59)
Globulin, Total: 2.1 g/dL (ref 1.5–4.5)
Glucose: 165 mg/dL — ABNORMAL HIGH (ref 65–99)
Potassium: 4.3 mmol/L (ref 3.5–5.2)
Sodium: 141 mmol/L (ref 134–144)
Total Protein: 6.3 g/dL (ref 6.0–8.5)

## 2020-01-16 NOTE — Chronic Care Management (AMB) (Signed)
Chronic Care Management    Clinical Social Work Follow Up Note  01/16/2020 Name: Todd Zhang MRN: [id number] DOB: [birth year]  Todd Zhang is a 74 y.o. year old male who is a primary care patient of [name], [place]. The CCM team was consulted for assistance with [employer] .   Review of patient status, including review of consultants reports, other relevant assessments, and collaboration with appropriate care team members and the patient's provider was performed as part of comprehensive patient evaluation and provision of chronic care management services.    SDOH (Social Determinants of Health) assessments performed: Yes;risk of tobacco use; risk of food insecurity; risk of housing needs    Chronic Care Management from 06/13/2019 in [place]  PHQ-9 Total Score  6      GAD 7 : Generalized Anxiety Score 01/10/2020 06/13/2019  Nervous, Anxious, on Edge 1 1  Control/stop worrying 1 1  Worry too much - different things 1 1  Trouble relaxing 0 1  Restless 0 1  Easily annoyed or irritable 0 0  Afraid - awful might happen 0 0  Total GAD 7 Score 3 5  Anxiety Difficulty Somewhat difficult Somewhat difficult    Outpatient Encounter Medications as of 01/16/2020  Medication Sig  . ACCU-CHEK AVIVA PLUS test strip USE TO check blood glucose TWICE DAILY  . alendronate (FOSAMAX) 70 MG tablet Take 1 tablet (70 mg total) by mouth once a week. Take with a full glass of water on an empty stomach.  . benazepril (LOTENSIN) 5 MG tablet Take 1 tablet (5 mg total) by mouth daily.  . fenofibrate (TRICOR) 145 MG tablet Take 1 tablet (145 mg total) by mouth daily.  [name] gabapentin (NEURONTIN) 300 MG capsule TAKE 1 CAPSULE BY MOUTH THREE TIMES DAILY FOR PAIN  . insulin regular (HUMULIN R) 100 units/mL injection INJECT 10 UNITS INTO THE SKIN THREE TIMES DAILY BEFORE MEALS  . Insulin Syringe-Needle U-100 (GLOBAL INJECT EASE INSULIN SYR) 31G X 5/16" 0.3 ML MISC Use with  insulin Dx E11.9  . metFORMIN (GLUCOPHAGE) 500 MG tablet Take 1 tablet (500 mg total) by mouth 2 (two) times daily.  . metoprolol succinate (TOPROL-XL) 50 MG 24 hr tablet Take 1 tablet (50 mg total) by mouth daily. Take with or immediately following a meal.  . Multiple Vitamin (MULTIVITAMIN) tablet Take 1 tablet by mouth daily.  [name] omeprazole (PRILOSEC) 20 MG capsule Take 1 capsule (20 mg total) by mouth daily.  . pravastatin (PRAVACHOL) 40 MG tablet Take 1 tablet (40 mg total) by mouth daily.  . tamsulosin (FLOMAX) 0.4 MG CAPS capsule Take 1 capsule (0.4 mg total) by mouth daily.  . vitamin B-12 (CYANOCOBALAMIN) 500 MCG tablet Take 500 mcg by mouth daily.  Alveda Reasons 20 MG TABS tablet Take 1 tablet (20 mg total) by mouth daily.   No facility-administered encounter medications on file as of 01/16/2020.    Goals    . "I need stable housing" (pt-stated)     Current Barriers:  . Lacks caregiver support.  . Financial Constraints in patient with Chronic Diagnoses of Type 2 DM, HTN, Atrial Fibrillation, GERD, COPD, DDD and GAD . Literacy barriers . transportation  Nurse Case Manager Clinical Goal(s):  [name] Over the next 30 days, patient will work with CCM team to address needs related to homelessness  Interventions:   LCSW has worked with patient regarding housing assistance.  LCSW talked with client about current food supply of client  Talked  previously with client about sleeping challenges of client  Talked previously with client about pain issues of client  Talked with client about his recent application to housing complex in [place], [place] . Talked with client about his upcoming medical appointments  . Talked with Todd Zhang, sister of client, about needs of client . Talked with Todd Zhang about client use of Diabetic Shoes   Patient Self Care Activities:  . Performs ADL's independently . Performs IADL's independently  See previous goal updates    Follow Up Plan:LCSW to call  client in next 4 weeks to assess the housing needs of client at that time  Todd Zhang.Todd Zhang MSW, LCSW Licensed Clinical Social Worker [facility] Care Management [phone]

## 2020-01-16 NOTE — Patient Instructions (Addendum)
Licensed Clinical Social Worker Visit Information  Goals we discussed today:  Goals    . "I need stable housing" (pt-stated)     Current Barriers:  . Lacks caregiver support.  . Financial Constraints in patient with Chronic Diagnoses of Type 2 DM, HTN, Atrial Fibrillation, GERD, COPD, DDD and GAD . Literacy barriers . transportation  Nurse Case Manager Clinical Goal(s):  [name] Over the next 30 days, patient will work with CCM team to address needs related to homelessness  Interventions:  LCSW has worked with patient regarding housing assistance.  LCSW talked with client about current food supply of client  Talked previously with client about sleeping challenges of client Talked previously with client about pain issues of client Talked with client about his recent application to housing complex in [place], [place] Talked with client about his upcoming medical appointments  Talked with Glenard Haring, sister of client, about needs of client  Talked with Glenard Haring about client use of Diabetic Shoes   Patient Self Care Activities:  . Performs ADL's independently . Performs IADL's independently  See previous goal updates   .      Materials Provided: No  Follow Up Plan : LCSW to call client in next 4 weeks to assess the housing needs of client at that time  The patient verbalized understanding of instructions provided today and declined a print copy of patient instruction materials.   Kelton Pillar.Marianita Botkin MSW, LCSW Licensed Clinical Social Worker [facility] Care Management [phone]

## 2020-01-31 ENCOUNTER — Ambulatory Visit: Payer: Medicare Other | Admitting: *Deleted

## 2020-01-31 DIAGNOSIS — E1140 Type 2 diabetes mellitus with diabetic neuropathy, unspecified: Secondary | ICD-10-CM

## 2020-01-31 DIAGNOSIS — G8929 Other chronic pain: Secondary | ICD-10-CM

## 2020-01-31 DIAGNOSIS — M545 Low back pain, unspecified: Secondary | ICD-10-CM

## 2020-01-31 NOTE — Chronic Care Management (AMB) (Signed)
.  Chronic Care Management   Follow Up Note   01/31/2020 Name: Todd Zhang MRN: [id number] DOB: Feb 20, 1946  Referred by: [name], FNP Reason for referral : Chronic Care Management (RN follow up)   Todd Zhang is a 74 y.o. year old male who is a primary care patient of [name], FNP. The CCM team was consulted for assistance with chronic disease management and care coordination needs.    Review of patient status, including review of consultants reports, relevant laboratory and other test results, and collaboration with appropriate care team members and the patient's provider was performed as part of comprehensive patient evaluation and provision of chronic care management services.    SDOH (Social Determinants of Health) assessments performed: No See Care Plan activities for detailed interventions related to [facility])     Outpatient Encounter Medications as of 01/31/2020  Medication Sig  . ACCU-CHEK AVIVA PLUS test strip USE TO check blood glucose TWICE DAILY  . alendronate (FOSAMAX) 70 MG tablet Take 1 tablet (70 mg total) by mouth once a week. Take with a full glass of water on an empty stomach.  . benazepril (LOTENSIN) 5 MG tablet Take 1 tablet (5 mg total) by mouth daily.  . fenofibrate (TRICOR) 145 MG tablet Take 1 tablet (145 mg total) by mouth daily.  [name] gabapentin (NEURONTIN) 300 MG capsule TAKE 1 CAPSULE BY MOUTH THREE TIMES DAILY FOR PAIN  . insulin regular (HUMULIN R) 100 units/mL injection INJECT 10 UNITS INTO THE SKIN THREE TIMES DAILY BEFORE MEALS  . Insulin Syringe-Needle U-100 (GLOBAL INJECT EASE INSULIN SYR) 31G X 5/16" 0.3 ML MISC Use with insulin Dx E11.9  . metFORMIN (GLUCOPHAGE) 500 MG tablet Take 1 tablet (500 mg total) by mouth 2 (two) times daily.  . metoprolol succinate (TOPROL-XL) 50 MG 24 hr tablet Take 1 tablet (50 mg total) by mouth daily. Take with or immediately following a meal.  . Multiple Vitamin (MULTIVITAMIN) tablet Take 1  tablet by mouth daily.  [name] omeprazole (PRILOSEC) 20 MG capsule Take 1 capsule (20 mg total) by mouth daily.  . pravastatin (PRAVACHOL) 40 MG tablet Take 1 tablet (40 mg total) by mouth daily.  . tamsulosin (FLOMAX) 0.4 MG CAPS capsule Take 1 capsule (0.4 mg total) by mouth daily.  . vitamin B-12 (CYANOCOBALAMIN) 500 MCG tablet Take 500 mcg by mouth daily.  [name] 20 MG TABS tablet Take 1 tablet (20 mg total) by mouth daily.   No facility-administered encounter medications on file as of 01/31/2020.     RN Care Plan   . "I want my toe to heal up" (pt-stated)       Toe wound in a patient with diabetes and peripheral vascular disease.   Current Barriers:  [name] Knowledge Deficits related to diabetic foot care and wound care . Chronic Disease Management support and education needs related to peripheral artery disease due to diabetes and hypertension  Nurse Case Manager Clinical Goal(s):  [name] Over the next 30 days, patient will continue to see podiatrist every 2 weeks or as recommended  Interventions:  . Chart reviewed . Talked with patient about wound care . Discussed podiatry appointments o Had an appt last Tuesday and a follow-up this coming Tuesday . Discussed need for diabetic shoes . Reached out to [name] at [phone] and left a message requesting that they return my call regarding diabetic shoes o Are they able to prescribe them or does he need to f/u with PCP for order?  Patient Self Care Activities:  . Performs ADL's independently . Performs IADL's independently  Please see past updates related to this goal by clicking on the "Past Updates" button in the selected goal       . "I want to keep my blood sugar under control" (pt-stated)       CARE PLAN ENTRY (see longitudinal plan of care for additional care plan information)  Current Barriers:  . Chronic Disease Management support and education needs related to diabetes  Nurse Case Manager Clinical Goal(s):  [name] Over the  next 60 days, patient will work with Consulting civil engineer to address needs related to diabetes  Interventions:  . Inter-disciplinary care team collaboration (see longitudinal plan of care) . Evaluation of current treatment plan related to diabetes and patient's adherence to plan as established by provider. . Reviewed medications with patient and discussed metformin and Humulin. Patient reports that he has not been taking Humulin for several weeks because his blood sugar has been within range without it . Discussed plans with patient for ongoing care management follow up and provided patient with direct contact information for care management team . Advised patient, providing education and rationale, to check cbg twice daily and PRN and record, calling [phone] for findings outside established parameters.   . Chart reviewed including recent office notes and lab results o Last A1C was 7.1 . Discussed home blood sugar readings o Fasting running between 80 and 110 and afternoon is around 130 . Discussed diet . Encouraged patient to increase water intake and decrease or eliminate intake of sugary juices/drinks  Patient Self Care Activities:  . Performs ADL's independently . Performs IADL's independently  Initial goal documentation     . Hematuria       CARE PLAN ENTRY (see longtitudinal plan of care for additional care plan information) Hematuria on urinalysis in patient with HTN, DM, and BPH  Current Barriers:  [name] Knowledge Deficits related to hematuria . Literacy barriers . Transportation barriers . Cognitive Deficits  Nurse Case Manager Clinical Goal(s):  [name] Over the next 7 days, patient will follow-up with [facility] for a renal/bladder ultrasound  Interventions:  . Chart reviewed . Previously confirmed that he is scheduled for a renal/bladder US at [facility] on 11/15/2019 . Talked with patient by telephone o Reports that he did not have ultrasound . Discussed  transportation issues and financial constraints . Requested medical records from [facility] to be faxed to PCP for review and follow-up  Patient Self Care Activities:  . Performs ADL's independently . Performs IADL's independently  Please see past updates related to this goal by clicking on the "Past Updates" button in the selected goal      . Pain Management       Chronic pain in patient with diabetic neuropathy and peripheral vascular disease.   Current Barriers:  . Film/video editor.  . Transportation barriers  Nurse Case Manager Clinical Goal(s):  [name] Over the next 7 days, patient will talk with RN Care Manager regarding pain management  Interventions:  . Chart reviewed . Talked with patient by phone  . Confirmed that patient has been seeing [facility] regularly . Discussed medications. He does not have any pain medications on his med list and reports that he isn't prescribed any by [facility] . Discussed transportation problems to [place] . Discussed financial constraints . [place] at [phone] and requested that they fax over the last 2 office notes and lab results for PCP  to review . Collaborated with PCP/nurse regarding records. If [facility] isn't managing pain, then he doesn't need to continue seeing them. PCP can manage all primary care issues.   Patient Self Care Activities:  . Performs ADL's independently . Performs IADL's independently  Please see past updates related to this goal by clicking on the "Past Updates" button in the selected goal          Plan:   The care management team will reach out to the patient again over the next 7 days.    [name], BSN, RN-BC Embedded Chronic Care Manager [facility] / [facility] Care Management Direct Dial: [phone]

## 2020-01-31 NOTE — Patient Instructions (Signed)
Visit Information  Goals Addressed            This Visit's Progress     Patient Stated   . "I want my toe to heal up" (pt-stated)       Toe wound in a patient with diabetes and peripheral vascular disease.   Current Barriers:  [name] Knowledge Deficits related to diabetic foot care and wound care . Chronic Disease Management support and education needs related to peripheral artery disease due to diabetes and hypertension  Nurse Case Manager Clinical Goal(s):  [name] Over the next 30 days, patient will continue to see podiatrist every 2 weeks or as recommended  Interventions:  . Chart reviewed . Talked with patient about wound care . Discussed podiatry appointments o Had an appt last Tuesday and a follow-up this coming Tuesday . Discussed need for diabetic shoes . Reached out to [name] at [phone] and left a message requesting that they return my call regarding diabetic shoes o Are they able to prescribe them or does he need to f/u with PCP for order?   Patient Self Care Activities:  . Performs ADL's independently . Performs IADL's independently  Please see past updates related to this goal by clicking on the "Past Updates" button in the selected goal       . "I want to keep my blood sugar under control" (pt-stated)       CARE PLAN ENTRY (see longitudinal plan of care for additional care plan information)  Current Barriers:  . Chronic Disease Management support and education needs related to diabetes  Nurse Case Manager Clinical Goal(s):  [name] Over the next 60 days, patient will work with [name] to address needs related to diabetes  Interventions:  . Inter-disciplinary care team collaboration (see longitudinal plan of care) . Evaluation of current treatment plan related to diabetes and patient's adherence to plan as established by provider. . Reviewed medications with patient and discussed metformin and Humulin. Patient reports that he has not been taking Humulin  for several weeks because his blood sugar has been within range without it . Discussed plans with patient for ongoing care management follow up and provided patient with direct contact information for care management team . Advised patient, providing education and rationale, to check cbg twice daily and PRN and record, calling [phone] for findings outside established parameters.   . Chart reviewed including recent office notes and lab results o Last A1C was 7.1 . Discussed home blood sugar readings o Fasting running between 80 and 110 and afternoon is around 130 . Discussed diet . Encouraged patient to increase water intake and decrease or eliminate intake of sugary juices/drinks  Patient Self Care Activities:  . Performs ADL's independently . Performs IADL's independently  Initial goal documentation       Other   . Hematuria       CARE PLAN ENTRY (see longtitudinal plan of care for additional care plan information) Hematuria on urinalysis in patient with HTN, DM, and BPH  Current Barriers:  [name] Knowledge Deficits related to hematuria . Literacy barriers . Transportation barriers . Cognitive Deficits  Nurse Case Manager Clinical Goal(s):  [name] Over the next 7 days, patient will follow-up with [facility] for a renal/bladder ultrasound  Interventions:  . Chart reviewed . Previously confirmed that he is scheduled for a renal/bladder US at [facility] on 11/15/2019 . Talked with patient by telephone o Reports that he did not have ultrasound . Discussed transportation issues and  financial constraints . Requested medical records from [facility] to be faxed to PCP for review and follow-up  Patient Self Care Activities:  . Performs ADL's independently . Performs IADL's independently  Please see past updates related to this goal by clicking on the "Past Updates" button in the selected goal      . Pain Management       Chronic pain in patient with diabetic  neuropathy and peripheral vascular disease.   Current Barriers:  . Film/video editor.  . Transportation barriers  Nurse Case Manager Clinical Goal(s):  [name] Over the next 7 days, patient will talk with RN Care Manager regarding pain management  Interventions:  . Chart reviewed . Talked with patient by phone  . Confirmed that patient has been seeing [facility] regularly . Discussed medications. He does not have any pain medications on his med list and reports that he isn't prescribed any by [facility] . Discussed transportation problems to [place] . Discussed financial constraints . [place] at [phone] and requested that they fax over the last 2 office notes and lab results for PCP to review . Collaborated with PCP/nurse regarding records. If [facility] isn't managing pain, then he doesn't need to continue seeing them. PCP can manage all primary care issues.   Patient Self Care Activities:  . Performs ADL's independently . Performs IADL's independently  Please see past updates related to this goal by clicking on the "Past Updates" button in the selected goal         The patient verbalized understanding of instructions provided today and declined a print copy of patient instruction materials.   Follow-up Plan The care management team will reach out to the patient again over the next 7 days.   Chong Sicilian, BSN, RN-BC Embedded Chronic Care Manager [facility] / [place] Management Direct Dial: [phone]

## 2020-02-05 ENCOUNTER — Telehealth (INDEPENDENT_AMBULATORY_CARE_PROVIDER_SITE_OTHER): Payer: Self-pay | Admitting: *Deleted

## 2020-02-05 ENCOUNTER — Ambulatory Visit: Payer: Medicare Other | Admitting: *Deleted

## 2020-02-05 DIAGNOSIS — Z794 Long term (current) use of insulin: Secondary | ICD-10-CM

## 2020-02-05 DIAGNOSIS — E1140 Type 2 diabetes mellitus with diabetic neuropathy, unspecified: Secondary | ICD-10-CM

## 2020-02-05 DIAGNOSIS — J441 Chronic obstructive pulmonary disease with (acute) exacerbation: Secondary | ICD-10-CM | POA: Diagnosis not present

## 2020-02-05 DIAGNOSIS — I10 Essential (primary) hypertension: Secondary | ICD-10-CM

## 2020-02-05 DIAGNOSIS — I739 Peripheral vascular disease, unspecified: Secondary | ICD-10-CM

## 2020-02-05 DIAGNOSIS — I4891 Unspecified atrial fibrillation: Secondary | ICD-10-CM

## 2020-02-05 NOTE — Patient Instructions (Signed)
Visit Information  Goals Addressed            This Visit's Progress     Patient Stated   . "I need diabetic shoes" (pt-stated)       CARE PLAN ENTRY (see longitudinal plan of care for additional care plan information)  Current Barriers:  . Care Coordination needs related to diabetic shoes in a patient with diabetes (disease states)  Nurse Case Manager Clinical Goal(s):  [name] Over the next 30 days, patient will work with PCP to address needs related to diabetic shoes  Interventions:  . Inter-disciplinary care team collaboration (see longitudinal plan of care) . Talked with patient by telephone . Chart reviewed . Previously reached out to podiatrist, [name], regarding diabetic shoes but have not received a response . Sent message to [facility] clinical staff asking that they schedule patient for a diabetic foot exam and an order for diabetic shoes  Patient Self Care Activities:  . Patient verbalizes understanding of plan to schedule appt at PCP office for evaluation for diabetic shoes . Performs ADL's independently . Performs IADL's independently  Initial goal documentation     . "I want my toe to heal up" (pt-stated)       Toe wound in a patient with diabetes and peripheral vascular disease.   Current Barriers:  [name] Knowledge Deficits related to diabetic foot care and wound care . Chronic Disease Management support and education needs related to peripheral artery disease due to diabetes and hypertension  Nurse Case Manager Clinical Goal(s):  [name] Over the next 30 days, patient will continue to see podiatrist every 2 weeks or as recommended  Interventions:  . Chart reviewed . Talked with patient about wound care . Discussed podiatry appointments o Had a visit with [name] today . Discussed need for diabetic shoes . Previously reached out to [name] at [phone] and left a message requesting that they return my call regarding diabetic shoes. No return call received and he  didn't discuss shoes at today's appt. . Message sent to [facility] clinical staff asking that they reach out to patient to schedule an appt for a foot exam and an order for diabetic shoes . Encouraged patient to keep appts with [name] . Previously provided with CCM contact information and encouraged to reach out as needed  Patient Self Care Activities:  . Performs ADL's independently . Performs IADL's independently  Please see past updates related to this goal by clicking on the "Past Updates" button in the selected goal         Other   . Pain Management       Chronic pain in patient with diabetic neuropathy and peripheral vascular disease.   Current Barriers:  . Corporate treasurer.  . Transportation barriers  Nurse Case Manager Clinical Goal(s):  [name] Over the next 7 days, patient will talk with RN Care Manager regarding pain management  Interventions:  . Chart reviewed . Talked with patient by phone  . Records received from [place] . Collaborated with PCP's nurse regarding records. Patient was dismissed from [facility] for a cocaine positive drug screen . Advised patient that [facility] can manage primary care concerns but will not be able to manage pain  Patient Self Care Activities:  . Patient stated understanding . Performs ADL's independently . Performs IADL's independently  Please see past updates related to this goal by clicking on the "Past Updates" button in the selected goal  The patient verbalized understanding of instructions provided today and declined a print copy of patient instruction materials.   Follow-up Plan The care management team will reach out to the patient again over the next 30 days.   Chong Sicilian, BSN, RN-BC Embedded Chronic Care Manager [facility] / [place] Management Direct Dial: [phone]

## 2020-02-05 NOTE — Telephone Encounter (Signed)
Per [name], patient needs an appointment for diabetic foot exam and an order for diabetic shoes.  It appears he had the foot exam at his visit on 01/10/2020.  Can you place an order for diabetic shoes?

## 2020-02-05 NOTE — Telephone Encounter (Signed)
02/05/2020  Mr Knoll needs an appointment for a diabetic foot exam and an order for diabetic shoes.   I will forward to [facility] clinical staff for scheduling.   [name], BSN, RN-BC Embedded Chronic Care Manager [facility] / [facility] Care Management Direct Dial: [phone]

## 2020-02-05 NOTE — Chronic Care Management (AMB) (Signed)
Chronic Care Management   Follow Up Note   02/05/2020 Name: Todd Zhang MRN: [id number] DOB: Jan 01, 1946  Referred by: [name], FNP Reason for referral : Chronic Care Management (RN follow up)   Todd Zhang is a 74 y.o. year old male who is a primary care patient of [name], [place]. The CCM team was consulted for assistance with chronic disease management and care coordination needs.    Review of patient status, including review of consultants reports, relevant laboratory and other test results, and collaboration with appropriate care team members and the patient's provider was performed as part of comprehensive patient evaluation and provision of chronic care management services.    I spoke with Todd Zhang by telephone today regarding management of his chronic medical conditions.  SDOH (Social Determinants of Health) assessments performed: No See Care Plan activities for detailed interventions related to [facility])     Outpatient Encounter Medications as of 02/05/2020  Medication Sig  . ACCU-CHEK AVIVA PLUS test strip USE TO check blood glucose TWICE DAILY  . alendronate (FOSAMAX) 70 MG tablet Take 1 tablet (70 mg total) by mouth once a week. Take with a full glass of water on an empty stomach.  . benazepril (LOTENSIN) 5 MG tablet Take 1 tablet (5 mg total) by mouth daily.  . fenofibrate (TRICOR) 145 MG tablet Take 1 tablet (145 mg total) by mouth daily.  [name] gabapentin (NEURONTIN) 300 MG capsule TAKE 1 CAPSULE BY MOUTH THREE TIMES DAILY FOR PAIN  . insulin regular (HUMULIN R) 100 units/mL injection INJECT 10 UNITS INTO THE SKIN THREE TIMES DAILY BEFORE MEALS  . Insulin Syringe-Needle U-100 (GLOBAL INJECT EASE INSULIN SYR) 31G X 5/16" 0.3 ML MISC Use with insulin Dx E11.9  . metFORMIN (GLUCOPHAGE) 500 MG tablet Take 1 tablet (500 mg total) by mouth 2 (two) times daily.  . metoprolol succinate (TOPROL-XL) 50 MG 24 hr tablet Take 1 tablet (50 mg total) by mouth  daily. Take with or immediately following a meal.  . Multiple Vitamin (MULTIVITAMIN) tablet Take 1 tablet by mouth daily.  [name] omeprazole (PRILOSEC) 20 MG capsule Take 1 capsule (20 mg total) by mouth daily.  . pravastatin (PRAVACHOL) 40 MG tablet Take 1 tablet (40 mg total) by mouth daily.  . tamsulosin (FLOMAX) 0.4 MG CAPS capsule Take 1 capsule (0.4 mg total) by mouth daily.  . vitamin B-12 (CYANOCOBALAMIN) 500 MCG tablet Take 500 mcg by mouth daily.  Alveda Reasons 20 MG TABS tablet Take 1 tablet (20 mg total) by mouth daily.   No facility-administered encounter medications on file as of 02/05/2020.     RN Care Plan   . "I need diabetic shoes" (pt-stated)       CARE PLAN ENTRY (see longitudinal plan of care for additional care plan information)  Current Barriers:  . Care Coordination needs related to diabetic shoes in a patient with diabetes (disease states)  Nurse Case Manager Clinical Goal(s):  [name] Over the next 30 days, patient will work with PCP to address needs related to diabetic shoes  Interventions:  . Inter-disciplinary care team collaboration (see longitudinal plan of care) . Talked with patient by telephone . Chart reviewed . Previously reached out to podiatrist, Dr Irving Shows, regarding diabetic shoes but have not received a response . Sent message to [facility] clinical staff asking that they schedule patient for a diabetic foot exam and an order for diabetic shoes  Patient Self Care Activities:  . Patient verbalizes understanding of plan to  schedule appt at PCP office for evaluation for diabetic shoes . Performs ADL's independently . Performs IADL's independently  Initial goal documentation     . "I want my toe to heal up" (pt-stated)       Toe wound in a patient with diabetes and peripheral vascular disease.   Current Barriers:  [name] Knowledge Deficits related to diabetic foot care and wound care . Chronic Disease Management support and education needs related to peripheral  artery disease due to diabetes and hypertension  Nurse Case Manager Clinical Goal(s):  [name] Over the next 30 days, patient will continue to see podiatrist every 2 weeks or as recommended  Interventions:  . Chart reviewed . Talked with patient about wound care . Discussed podiatry appointments o Had a visit with [name] today . Discussed need for diabetic shoes . Previously reached out to [name] at [phone] and left a message requesting that they return my call regarding diabetic shoes. No return call received and he didn't discuss shoes at today's appt. . Message sent to [facility] clinical staff asking that they reach out to patient to schedule an appt for a foot exam and an order for diabetic shoes . Encouraged patient to keep appts with [name] . Previously provided with CCM contact information and encouraged to reach out as needed  Patient Self Care Activities:  . Performs ADL's independently . Performs IADL's independently  Please see past updates related to this goal by clicking on the "Past Updates" button in the selected goal       . Pain Management       Chronic pain in patient with diabetic neuropathy and peripheral vascular disease.   Current Barriers:  . Corporate treasurer.  . Transportation barriers  Nurse Case Manager Clinical Goal(s):  [name] Over the next 7 days, patient will talk with RN Care Manager regarding pain management  Interventions:  . Chart reviewed . Talked with patient by phone  . Records received from [place] . Collaborated with PCP's nurse regarding records. Patient was dismissed from [facility] for a cocaine positive drug screen . Advised patient that [facility] can manage primary care concerns but will not be able to manage pain  Patient Self Care Activities:  . Patient stated understanding . Performs ADL's independently . Performs IADL's independently  Please see past updates related to this goal by clicking on the "Past Updates"  button in the selected goal          Plan:   The care management team will reach out to the patient again over the next 30 days.    [name], BSN, RN-BC Embedded Chronic Care Manager [facility] Care Management Direct Dial: [phone]

## 2020-02-07 NOTE — Telephone Encounter (Signed)
Please write ordr for diabetic hoes and print out lat foot exam to go with it.

## 2020-02-12 ENCOUNTER — Other Ambulatory Visit: Payer: Self-pay

## 2020-02-12 DIAGNOSIS — Z794 Long term (current) use of insulin: Secondary | ICD-10-CM

## 2020-02-12 DIAGNOSIS — E1140 Type 2 diabetes mellitus with diabetic neuropathy, unspecified: Secondary | ICD-10-CM

## 2020-02-13 NOTE — Telephone Encounter (Signed)
Rx written and notes sent to patient through mail per patients request

## 2020-02-19 ENCOUNTER — Telehealth: Payer: Self-pay | Admitting: Nurse Practitioner

## 2020-02-19 ENCOUNTER — Ambulatory Visit (INDEPENDENT_AMBULATORY_CARE_PROVIDER_SITE_OTHER): Payer: Medicare Other | Admitting: Licensed Clinical Social Worker

## 2020-02-19 ENCOUNTER — Ambulatory Visit: Payer: Medicare Other | Admitting: *Deleted

## 2020-02-19 DIAGNOSIS — J441 Chronic obstructive pulmonary disease with (acute) exacerbation: Secondary | ICD-10-CM | POA: Diagnosis not present

## 2020-02-19 DIAGNOSIS — Z794 Long term (current) use of insulin: Secondary | ICD-10-CM

## 2020-02-19 DIAGNOSIS — E1140 Type 2 diabetes mellitus with diabetic neuropathy, unspecified: Secondary | ICD-10-CM | POA: Diagnosis not present

## 2020-02-19 DIAGNOSIS — I10 Essential (primary) hypertension: Secondary | ICD-10-CM

## 2020-02-19 DIAGNOSIS — I4891 Unspecified atrial fibrillation: Secondary | ICD-10-CM

## 2020-02-19 DIAGNOSIS — M5030 Other cervical disc degeneration, unspecified cervical region: Secondary | ICD-10-CM

## 2020-02-19 DIAGNOSIS — F411 Generalized anxiety disorder: Secondary | ICD-10-CM

## 2020-02-19 DIAGNOSIS — K219 Gastro-esophageal reflux disease without esophagitis: Secondary | ICD-10-CM

## 2020-02-19 NOTE — Chronic Care Management (AMB) (Signed)
Chronic Care Management    Clinical Social Work Follow Up Note  02/19/2020 Name: Todd Zhang MRN: [id number] DOB: 1945/10/05  Todd Zhang is a 74 y.o. year old male who is a primary care patient of [name], FNP. The CCM team was consulted for assistance with [name] .   Review of patient status, including review of consultants reports, other relevant assessments, and collaboration with appropriate care team members and the patient's provider was performed as part of comprehensive patient evaluation and provision of chronic care management services.    SDOH (Social Determinants of Health) assessments performed: Yes; risk for tobacco use; risk for depression; risk for food insecurity; risk for housing needs     Chronic Care Management from 06/13/2019 in [facility]  PHQ-9 Total Score  6     GAD 7 : Generalized Anxiety Score 01/10/2020 06/13/2019  Nervous, Anxious, on Edge 1 1  Control/stop worrying 1 1  Worry too much - different things 1 1  Trouble relaxing 0 1  Restless 0 1  Easily annoyed or irritable 0 0  Afraid - awful might happen 0 0  Total GAD 7 Score 3 5  Anxiety Difficulty Somewhat difficult Somewhat difficult    Outpatient Encounter Medications as of 02/19/2020  Medication Sig  . ACCU-CHEK AVIVA PLUS test strip USE TO check blood glucose TWICE DAILY  . alendronate (FOSAMAX) 70 MG tablet Take 1 tablet (70 mg total) by mouth once a week. Take with a full glass of water on an empty stomach.  . benazepril (LOTENSIN) 5 MG tablet Take 1 tablet (5 mg total) by mouth daily.  . fenofibrate (TRICOR) 145 MG tablet Take 1 tablet (145 mg total) by mouth daily.  [name] gabapentin (NEURONTIN) 300 MG capsule TAKE 1 CAPSULE BY MOUTH THREE TIMES DAILY FOR PAIN  . insulin regular (HUMULIN R) 100 units/mL injection INJECT 10 UNITS INTO THE SKIN THREE TIMES DAILY BEFORE MEALS  . Insulin Syringe-Needle U-100 (GLOBAL INJECT EASE INSULIN SYR) 31G X  5/16" 0.3 ML MISC Use with insulin Dx E11.9  . metFORMIN (GLUCOPHAGE) 500 MG tablet Take 1 tablet (500 mg total) by mouth 2 (two) times daily.  . metoprolol succinate (TOPROL-XL) 50 MG 24 hr tablet Take 1 tablet (50 mg total) by mouth daily. Take with or immediately following a meal.  . Multiple Vitamin (MULTIVITAMIN) tablet Take 1 tablet by mouth daily.  [name] omeprazole (PRILOSEC) 20 MG capsule Take 1 capsule (20 mg total) by mouth daily.  . pravastatin (PRAVACHOL) 40 MG tablet Take 1 tablet (40 mg total) by mouth daily.  . tamsulosin (FLOMAX) 0.4 MG CAPS capsule Take 1 capsule (0.4 mg total) by mouth daily.  . vitamin B-12 (CYANOCOBALAMIN) 500 MCG tablet Take 500 mcg by mouth daily.  [name] 20 MG TABS tablet Take 1 tablet (20 mg total) by mouth daily.   No facility-administered encounter medications on file as of 02/19/2020.    Goals    . "I need stable housing" (pt-stated)     Current Barriers:  . Lacks caregiver support.  . Financial Constraints in patient with Chronic Diagnoses of Type 2 DM, HTN, Atrial Fibrillation, GERD, COPD, DDD and GAD . Literacy barriers . transportation  Nurse Case Manager Clinical Goal(s):  [name] Over the next 30 days, patient will work with CCM team to address needs related to homelessness  Interventions:   . LCSW has worked with patient regarding housing assistance.  LCSW talked with client about current food  supply of client  Talked previously with client about sleeping challenges of client  Talked previously with client about pain issues of client  Talked with client about his recent application to housing complex in [place], [place]  Talked with client  about needs of client  Talked with client about his request for Diabetic shoes   Talked with client about upcoming client appointments  Talked with client about ambulation challenges  Talked with client about recent falls of client  Collaborated with RNCM about nursing needs of  client  Patient Self Care Activities:  . Performs ADL's independently . Performs IADL's independently  See previous goal updates    Follow Up Plan: LCSW to call client in next 4 weeks to assess the housing needs of client at that time  [name].Sanae Willetts MSW, LCSW Licensed Clinical Social Worker [facility] Care Management [phone]

## 2020-02-19 NOTE — Telephone Encounter (Signed)
Patient aware that it was mailed 02/13/20.

## 2020-02-19 NOTE — Patient Instructions (Addendum)
Licensed Clinical Social Worker Visit Information  Goals we discussed today:   . "I need stable housing" (pt-stated)     Current Barriers:  Lacks caregiver support.  Financial Constraints in patient with Chronic Diagnoses of Type 2 DM, HTN, Atrial Fibrillation, GERD, COPD, DDD and GAD  Literacy barriers  transportation Nurse Case Manager Clinical Goal(s):  Over the next 30 days, patient will work with CCM team to address needs related to homelessness Interventions:  . LCSW has worked with patient regarding housing assistance.  LCSW talked with client about current food supply of client  Talked with client about pain issues of client Talked with client about his recent application to housing complex in [place], [place] Talked with client about needs of client  Talked with client about his request for Diabetic shoes  Talked with client about upcoming client appointments  Talked with client about ambulation challenges  Talked with client about recent falls of client  Collaborated with RNCM about nursing needs of client  Patient Self Care Activities:  Performs ADL's independently    Performs IADL's independently  See previous goal updates    Follow Up Plan: LCSW to call client in next 4 weeks to assess the housing needs of client at that time  Materials Provided: No  The patient verbalized understanding of instructions provided today and declined a print copy of patient instruction materials.   Kelton Pillar.Irving Lubbers MSW, LCSW Licensed Clinical Social Worker [facility] Care Management [phone]

## 2020-02-20 NOTE — Chronic Care Management (AMB) (Signed)
  Chronic Care Management   Follow Up Note   02/19/2020 Name: Todd Zhang MRN: [id number] DOB: [birth year]  Referred by: [name], FNP Reason for referral : Chronic Care Management (Care Coordination)   Todd Zhang is a 74 y.o. year old male who is a primary care patient of [name], FNP. The CCM team was consulted for assistance with chronic disease management and care coordination needs.    Review of patient status, including review of consultants reports, relevant laboratory and other test results, and collaboration with appropriate care team members and the patient's provider was performed as part of comprehensive patient evaluation and provision of chronic care management services.    I was consulted by Lorna Few, LCSW who spoke with patient by telephone earlier today. He has not received the order for diabetic shoes but has been in contact with his PCP/nurse regarding this. Todd Zhang also relayed that [name] has experienced several falls, without injury, recently.   RN Care Plan   . "I need diabetic shoes"       CARE PLAN ENTRY (see longitudinal plan of care for additional care plan information)  Current Barriers:  . Care Coordination needs related to diabetic shoes in a patient with diabetes (disease states)  Nurse Case Manager Clinical Goal(s):  [name] Over the next 30 days, patient will work with PCP to address needs related to diabetic shoes  Interventions:  . Inter-disciplinary care team collaboration (see longitudinal plan of care) . Consulted by Lorna Few, LCSW who spoke with patient earlier today . Chart reviewed including recent office notes, telephone notes, and orders o Per EMR, Rx for diabetic shoes, along with the foot exam form, was mailed to patient per his request on 02/13/20 . RN will f/u with patient over the next 14 days to verify receipt  Patient Self Care Activities:  . Patient verbalizes understanding of plan to schedule appt  at PCP office for evaluation for diabetic shoes . Performs ADL's independently . Performs IADL's independently  Please see past updates related to this goal by clicking on the "Past Updates" button in the selected goal          Plan:  The care management team will reach out to the patient again over the next 15 days to discuss falls and diabetic shoes.    [name], BSN, RN-BC Embedded Chronic Care Manager [facility] Care Management Direct Dial: [phone]

## 2020-02-27 ENCOUNTER — Ambulatory Visit: Payer: Medicare Other | Admitting: *Deleted

## 2020-02-28 NOTE — Chronic Care Management (AMB) (Signed)
  Chronic Care Management   Follow-up Note  02/27/2020 Name: Todd Zhang MRN: [id number] DOB: 02/22/1946  Referred by: [name], FNP Reason for referral : Chronic Care Management (RN follow up)   An unsuccessful telephone follow-up was attempted today. The patient was referred to the case management team for assistance with care management and care coordination.   Follow Up Plan: A HIPPA compliant phone message was left for the patient providing contact information and requesting a return call.  The care management team will reach out to the patient again over the next 30 days.   [name], BSN, RN-BC Embedded Chronic Care Manager [facility] Care Management Direct Dial: [phone]

## 2020-03-04 ENCOUNTER — Encounter: Payer: Self-pay | Admitting: Urology

## 2020-03-04 ENCOUNTER — Ambulatory Visit (INDEPENDENT_AMBULATORY_CARE_PROVIDER_SITE_OTHER): Payer: Medicare Other | Admitting: Urology

## 2020-03-04 ENCOUNTER — Other Ambulatory Visit: Payer: Self-pay

## 2020-03-04 VITALS — BP 144/73 | HR 75 | Temp 96.8°F | Ht 72.0 in | Wt 222.0 lb

## 2020-03-04 DIAGNOSIS — N401 Enlarged prostate with lower urinary tract symptoms: Secondary | ICD-10-CM | POA: Diagnosis not present

## 2020-03-04 DIAGNOSIS — R3914 Feeling of incomplete bladder emptying: Secondary | ICD-10-CM

## 2020-03-04 LAB — POCT URINALYSIS DIPSTICK
Bilirubin, UA: NEGATIVE
Glucose, UA: NEGATIVE
Ketones, UA: NEGATIVE
Leukocytes, UA: NEGATIVE
Nitrite, UA: NEGATIVE
Protein, UA: POSITIVE — AB
Spec Grav, UA: 1.025 (ref 1.010–1.025)
Urobilinogen, UA: NEGATIVE E.U./dL — AB
pH, UA: 6 (ref 5.0–8.0)

## 2020-03-04 NOTE — Progress Notes (Signed)
H&P  Chief Complaint: BPH w/ LUTS  History of Present Illness:   6.22.2021: Here today referred by his PCP to discuss management of his BPH. He was last seen per our office prior to 2007 but he cannot quite recall why. At present, he c/o weak urinary stream and increased urinary freq (particularly in the night). He was started on tamsulosin a few months ago but has had little improvement on this. He reports having quite a bit of swelling on his ankles and that he can feel this fluid draining off at night.   His PSA has been stable and very low.   IPSS Questionnaire (AUA-7): Over the past month.   1)  How often have you had a sensation of not emptying your bladder completely after you finish urinating?  3 - About half the time  2)  How often have you had to urinate again less than two hours after you finished urinating? 4 - More than half the time  3)  How often have you found you stopped and started again several times when you urinated?  0 - Not at all  4) How difficult have you found it to postpone urination?  5 - Almost always  5) How often have you had a weak urinary stream?  4 - More than half the time  6) How often have you had to push or strain to begin urination?  2 - Less than half the time  7) How many times did you most typically get up to urinate from the time you went to bed until the time you got up in the morning?  3 - 3 times  Total score:  0-7 mildly symptomatic   8-19 moderately symptomatic   20-35 severely symptomatic   Total: 21 QoL: (not reported)  Past Medical History:  Diagnosis Date  . Atrial fibrillation (HCC)   . Cervical spinal stenosis   . DDD (degenerative disc disease), cervical   . DDD (degenerative disc disease), lumbar   . Diabetes mellitus without complication (HCC)   . Hyperlipidemia   . Hypertension   . Peripheral artery disease [facility])     Past Surgical History:  Procedure Laterality Date  . AMPUTATION TOE    . KNEE SURGERY Left   . NOSE  SURGERY      Home Medications:  Allergies as of 03/04/2020      Reactions   Asa [aspirin] Hives   Penicillins Hives      Medication List       Accurate as of March 04, 2020  3:01 PM. If you have any questions, ask your nurse or doctor.        Accu-Chek Aviva Plus test strip Generic drug: glucose blood USE TO check blood glucose TWICE DAILY   alendronate 70 MG tablet Commonly known as: FOSAMAX Take 1 tablet (70 mg total) by mouth once a week. Take with a full glass of water on an empty stomach.   benazepril 5 MG tablet Commonly known as: LOTENSIN Take 1 tablet (5 mg total) by mouth daily.   cetirizine 10 MG tablet Commonly known as: ZYRTEC Take 10 mg by mouth daily.   fenofibrate 145 MG tablet Commonly known as: TRICOR Take 1 tablet (145 mg total) by mouth daily.   gabapentin 300 MG capsule Commonly known as: NEURONTIN TAKE 1 CAPSULE BY MOUTH THREE TIMES DAILY FOR PAIN   insulin regular 100 units/mL injection Commonly known as: HumuLIN R INJECT 10 UNITS INTO THE SKIN THREE  TIMES DAILY BEFORE MEALS   Insulin Syringe-Needle U-100 31G X 5/16" 0.3 ML Misc Commonly known as: Global Inject Ease Insulin Syr Use with insulin Dx E11.9   metFORMIN 500 MG tablet Commonly known as: GLUCOPHAGE Take 1 tablet (500 mg total) by mouth 2 (two) times daily.   metoprolol succinate 50 MG 24 hr tablet Commonly known as: TOPROL-XL Take 1 tablet (50 mg total) by mouth daily. Take with or immediately following a meal.   multivitamin tablet Take 1 tablet by mouth daily.   mupirocin ointment 2 % Commonly known as: BACTROBAN APPLY TO LEFT GREAT TOE   omeprazole 20 MG capsule Commonly known as: PRILOSEC Take 1 capsule (20 mg total) by mouth daily.   pravastatin 40 MG tablet Commonly known as: PRAVACHOL Take 1 tablet (40 mg total) by mouth daily.   tamsulosin 0.4 MG Caps capsule Commonly known as: FLOMAX Take 1 capsule (0.4 mg total) by mouth daily.   triamcinolone cream  0.5 % Commonly known as: KENALOG APPLY TO THE AFFECTED AREA(S) THREE TIMES DAILY   vitamin B-12 500 MCG tablet Commonly known as: CYANOCOBALAMIN Take 500 mcg by mouth daily.   Xarelto 20 MG Tabs tablet Generic drug: rivaroxaban Take 1 tablet (20 mg total) by mouth daily.       Allergies:  Allergies  Allergen Reactions  . Asa [Aspirin] Hives  . Penicillins Hives    Family History  Problem Relation Age of Onset  . Stroke Mother   . Heart disease Mother        CABG  . Cancer Father     Social History:  reports that he quit smoking about 8 years ago. His smoking use included cigarettes. He started smoking about 53 years ago. He smoked 1.00 pack per day. He has never used smokeless tobacco. He reports previous drug use. He reports that he does not drink alcohol.  ROS: Urological Symptom Review  Patient is experiencing the following symptoms: Frequent urination Hard to postpone urination Get up at night to urinate Review of Systems Gastrointestinal (upper)  : Negative for upper GI symptoms Gastrointestinal (lower) : Negative for lower GI symptoms Constitutional : Negative for symptoms Skin: Negative for skin symptoms Eyes: Negative for eye symptoms Ear/Nose/Throat : Negative for Ear/Nose/Throat symptoms Hematologic/Lymphatic: Negative for Hematologic/Lymphatic symptoms Cardiovascular : Negative for cardiovascular symptoms Respiratory : Negative for respiratory symptoms Endocrine: Negative for endocrine symptoms Musculoskeletal: Negative for musculoskeletal symptoms Neurological: Negative for neurological symptoms Psychologic: Negative for psychiatric symptoms  Physical Exam:  Vital signs in last 24 hours: BP (!) 144/73   Pulse 75   Temp (!) 96.8 F (36 C)   Ht 6' (1.829 m)   Wt 222 lb (100.7 kg)   BMI 30.11 kg/m  Constitutional:  Alert and oriented, No acute distress, mild peripheral edema on lower extremities.  Cardiovascular: Regular rate    Respiratory: Normal respiratory effort GI: Abdomen is soft, nontender, nondistended, no abdominal masses. No CVAT. No hernias.  Genitourinary: Normal male phallus (uncircumcised), testes are descended bilaterally and non-tender and without masses, scrotum is normal in appearance without lesions or masses, perineum is normal on inspection. Prostate feels around 30 grams in size, no nodularity.  Lymphatic: No lymphadenopathy Neurologic: Grossly intact, no focal deficits Psychiatric: Normal mood and affect   Results for orders placed or performed in visit on 03/04/20 (from the past 24 hour(s))  POCT urinalysis dipstick     Status: Abnormal   Collection Time: 03/04/20  2:37 PM  Result Value Ref  Range   Color, UA yellow    Clarity, UA clear    Glucose, UA Negative Negative   Bilirubin, UA neg    Ketones, UA neg    Spec Grav, UA 1.025 1.010 - 1.025   Blood, UA hemolyzed trace    pH, UA 6.0 5.0 - 8.0   Protein, UA Positive (A) Negative   Urobilinogen, UA negative (A) 0.2 or 1.0 E.U./dL   Nitrite, UA neg    Leukocytes, UA Negative Negative   Appearance     Odor     No results found for this or any previous visit (from the past 240 hour(s)).  I have reviewed prior pt notes  I have reviewed notes from referring/previous physicians  I have reviewed urinalysis results    Impression/Assessment:  Bothersome, worsening urinary sx's but his primary complaint is his nocturia. He does have moderate LE edema.. Advised on nocturia management. He is already on tamsulosin and is fine to remain on this.  Plan:  1. Continue on tamsulosin  2. Given nocturia management guidelines.   3. Return for OV in 3 mo

## 2020-03-04 NOTE — Progress Notes (Signed)
See progress note.

## 2020-03-10 ENCOUNTER — Ambulatory Visit: Payer: Medicare Other | Admitting: *Deleted

## 2020-03-10 NOTE — Chronic Care Management (AMB) (Signed)
  Chronic Care Management   Follow-up Outreach  03/10/2020 Name: Todd Zhang MRN: [id number] DOB: [birth year]  Referred by: [name], FNP Reason for referral : Chronic Care Management (RN follow up)   An unsuccessful telephone Follow-up was attempted today. The patient was referred to the case management team for assistance with care management and care coordination.   Follow Up Plan: The care management team will reach out to the patient again over the next 30 days.   [name], BSN, RN-BC Embedded Chronic Care Manager [facility] Care Management Direct Dial: [phone]

## 2020-03-14 ENCOUNTER — Other Ambulatory Visit: Payer: Self-pay | Admitting: Family Medicine

## 2020-03-17 ENCOUNTER — Other Ambulatory Visit: Payer: Self-pay | Admitting: Nurse Practitioner

## 2020-03-17 DIAGNOSIS — I10 Essential (primary) hypertension: Secondary | ICD-10-CM

## 2020-03-25 ENCOUNTER — Ambulatory Visit: Payer: Medicare Other | Admitting: *Deleted

## 2020-03-25 ENCOUNTER — Ambulatory Visit (INDEPENDENT_AMBULATORY_CARE_PROVIDER_SITE_OTHER): Payer: Medicare Other | Admitting: Licensed Clinical Social Worker

## 2020-03-25 DIAGNOSIS — E1140 Type 2 diabetes mellitus with diabetic neuropathy, unspecified: Secondary | ICD-10-CM

## 2020-03-25 DIAGNOSIS — I739 Peripheral vascular disease, unspecified: Secondary | ICD-10-CM

## 2020-03-25 DIAGNOSIS — I4891 Unspecified atrial fibrillation: Secondary | ICD-10-CM

## 2020-03-25 DIAGNOSIS — M5030 Other cervical disc degeneration, unspecified cervical region: Secondary | ICD-10-CM

## 2020-03-25 DIAGNOSIS — J441 Chronic obstructive pulmonary disease with (acute) exacerbation: Secondary | ICD-10-CM

## 2020-03-25 DIAGNOSIS — I10 Essential (primary) hypertension: Secondary | ICD-10-CM | POA: Diagnosis not present

## 2020-03-25 DIAGNOSIS — K219 Gastro-esophageal reflux disease without esophagitis: Secondary | ICD-10-CM

## 2020-03-25 DIAGNOSIS — Z794 Long term (current) use of insulin: Secondary | ICD-10-CM

## 2020-03-25 DIAGNOSIS — F411 Generalized anxiety disorder: Secondary | ICD-10-CM

## 2020-03-25 NOTE — Chronic Care Management (AMB) (Signed)
Chronic Care Management    Clinical Social Work Follow Up Note  03/25/2020 Name: Todd Zhang MRN: [id number] DOB: [birth year]  Todd Zhang is a 74 y.o. year old male who is a primary care patient of Todd Pierini, FNP. The CCM team was consulted for assistance with Todd Zhang .   Review of patient status, including review of consultants reports, other relevant assessments, and collaboration with appropriate care team members and the patient's provider was performed as part of comprehensive patient evaluation and provision of chronic care management services.    SDOH (Social Determinants of Health) assessments performed: No;risk for tobacco use; risk for depression; risk for food insecurity; risk for housing needs     Chronic Care Management from 06/13/2019 in [facility]  PHQ-9 Total Score 6     GAD 7 : Generalized Anxiety Score 01/10/2020 06/13/2019  Nervous, Anxious, on Edge 1 1  Control/stop worrying 1 1  Worry too much - different things 1 1  Trouble relaxing 0 1  Restless 0 1  Easily annoyed or irritable 0 0  Afraid - awful might happen 0 0  Total GAD 7 Score 3 5  Anxiety Difficulty Somewhat difficult Somewhat difficult    Outpatient Encounter Medications as of 03/25/2020  Medication Sig  . ACCU-CHEK AVIVA PLUS test strip USE TO check blood glucose TWICE DAILY  . alendronate (FOSAMAX) 70 MG tablet Take 1 tablet (70 mg total) by mouth once a week. Needs to be seen before next refill  . benazepril (LOTENSIN) 5 MG tablet Take 1 tablet (5 mg total) by mouth daily.  . cetirizine (ZYRTEC) 10 MG tablet Take 10 mg by mouth daily.  . fenofibrate (TRICOR) 145 MG tablet Take 1 tablet (145 mg total) by mouth daily.  [name] gabapentin (NEURONTIN) 300 MG capsule TAKE 1 CAPSULE BY MOUTH THREE TIMES DAILY FOR PAIN  . insulin regular (HUMULIN R) 100 units/mL injection INJECT 10 UNITS INTO THE SKIN THREE TIMES DAILY BEFORE MEALS  . Insulin Syringe-Needle  U-100 (GLOBAL INJECT EASE INSULIN SYR) 31G X 5/16" 0.3 ML MISC Use with insulin Dx E11.9  . metFORMIN (GLUCOPHAGE) 500 MG tablet Take 1 tablet (500 mg total) by mouth 2 (two) times daily.  . metoprolol succinate (TOPROL-XL) 50 MG 24 hr tablet TAKE 1 TABLET BY MOUTH DAILY, TAKE WITH FOOD OR IMMEDIATELY FOLLOWING A MEAL  . Multiple Vitamin (MULTIVITAMIN) tablet Take 1 tablet by mouth daily.  . mupirocin ointment (BACTROBAN) 2 % APPLY TO LEFT GREAT TOE  . omeprazole (PRILOSEC) 20 MG capsule Take 1 capsule (20 mg total) by mouth daily.  . pravastatin (PRAVACHOL) 40 MG tablet Take 1 tablet (40 mg total) by mouth daily.  . tamsulosin (FLOMAX) 0.4 MG CAPS capsule Take 1 capsule (0.4 mg total) by mouth daily.  [name] triamcinolone cream (KENALOG) 0.5 % APPLY TO THE AFFECTED AREA(S) THREE TIMES DAILY  . vitamin B-12 (CYANOCOBALAMIN) 500 MCG tablet Take 500 mcg by mouth daily.  Todd Zhang 20 MG TABS tablet Take 1 tablet (20 mg total) by mouth daily.   No facility-administered encounter medications on file as of 03/25/2020.    Goals    .  "I need stable housing" (pt-stated)      Current Barriers:  . Lacks caregiver support.  . Financial Constraints in patient with Chronic Diagnoses of Type 2 DM, HTN, Atrial Fibrillation, GERD, COPD, DDD and GAD . Literacy barriers . transportation  Nurse Case Manager Clinical Goal(s):  [name] Over the next 30  days, patient will work with CCM team to address needs related to homelessness  Interventions:   .LCSW has worked with patient regarding housing assistance.  LCSW talked with client about current food supply of client  Talkedwith client about sleeping challenges of client  Talkedwith client about pain issues of client  Talked with client  about needs of client  Talked with client about upcoming client appointments  Talked with client about ambulation challenges  Talked with client about recent falls of client . Collaborated with RNCM about nursing needs  of client  . Talked with client about his application for Diabetic shoes . Client and LCSW talked about condition of his toes (he said he has some sores        on his feet)  Client and LCSW talked about client appointment with Dr. Ulice Zhang, podiatrist scheduled in July of 2021.  Talked with client about housing applications of client  Talked with client about transport needs of client  Patient Self Care Activities:  . Performs ADL's independently . Performs IADL's independently  See previous goal updates     Follow Up Plan: LCSW to call client in next 4 weeks to assess the housing needs of client at that time  Kelton Pillar.Maricruz Lucero MSW, LCSW Licensed Clinical Social Worker [facility] Care Management [phone]

## 2020-03-25 NOTE — Patient Instructions (Addendum)
Licensed Clinical Social Worker Visit Information  Goals we discussed today:     "I need stable housing" (pt-stated)         Current Barriers:   Lacks caregiver support.   Financial Constraints in patient with Chronic Diagnoses of Type 2 DM, HTN, Atrial Fibrillation, GERD, COPD, DDD and GAD  Literacy barriers  transportation  Nurse Case Manager Clinical Goal(s):   Over the next 30 days, patient will work with CCM team to address needs related to homelessness  Interventions:   .LCSW has worked with patient regarding housing assistance.  LCSW talked with client about current food supply of client  Talkedwith client about sleeping challenges of client  Talkedwith client about pain issues of client  Talked withclientabout needs of client  Talked with client about upcoming client appointments  Talked with client about ambulation challenges  Talked with client about recent falls of client  Collaborated with RNCM about nursing needs of client   Talked with client about his application for Diabetic shoes  Client and LCSW talked about condition of his toes (he said he has some sores        on his feet)  Client and LCSW talked about client appointment with Dr. Ulice Brilliant, podiatrist scheduled in July of 2021.  Talked with client about housing applications of client  Talked with client about transport needs of client  Patient Self Care Activities:   Performs ADL's independently  Performs IADL's independently  See previous goal updates     Follow Up Plan:LCSW to call client in next 4 weeks to assess the housing needs of client at that time  Materials Provided: No  The patient verbalized understanding of instructions provided today and declined a print copy of patient instruction materials.   Kelton Pillar.Jaterrius Ricketson MSW, LCSW Licensed Clinical Social Worker [facility] Care Management [phone]

## 2020-03-25 NOTE — Chronic Care Management (AMB) (Signed)
°  Chronic Care Management   Care Coordination Note   03/25/2020 Name: Todd Zhang MRN: [id number] DOB: 10/01/1945  Referred by: [name], FNP Reason for referral : Chronic Care Management (care coordintation)   Todd Zhang is a 74 y.o. year old male who is a primary care patient of [name], FNP. The CCM team was consulted for assistance with chronic disease management and care coordination needs.    RN Care Plan     "I need diabetic shoes" (pt-stated)        CARE PLAN ENTRY (see longitudinal plan of care for additional care plan information)  Current Barriers:   Care Coordination needs related to diabetic shoes in a patient with diabetes (disease states)  Nurse Case Manager Clinical Goal(s):   Over the next 30 days, patient will work with PCP to address needs related to diabetic shoes  Interventions:   Inter-disciplinary care team collaboration (see longitudinal plan of care)  Consulted by Lorna Few, LCSW who spoke with patient earlier today o Per Lorin Picket, patient dropped off an "application" for diabetic shoes about a  month ago  Chart reviewed including recent office notes, telephone notes, and orders o Per EMR, Rx for diabetic shoes, along with the foot exam form, was mailed to patient per his request on 02/13/20  Collaborated with [name], LPN who verified that patient has received diabetic shoes from [facility] [place] in [place] in the past o Unable to locate "application" that patient referred to o [place] will print off order, have MD sign it, and fax to [place] with necessary documentation to have insurance cover  LCSW updated  RN Care Manager will reach out to patient over the next 7 days  Patient Self Care Activities:   Patient verbalizes understanding of plan to schedule appt at PCP office for evaluation for diabetic shoes  Performs ADL's independently  Performs IADL's independently  Please see past updates  related to this goal by clicking on the "Past Updates" button in the selected goal          Plan:   The care management team will reach out to the patient again over the next 7 days.    [name], BSN, RN-BC Embedded Chronic Care Manager [facility] / [facility] Care Management Direct Dial: [phone]

## 2020-03-31 ENCOUNTER — Ambulatory Visit: Payer: Medicare Other | Admitting: *Deleted

## 2020-03-31 DIAGNOSIS — J441 Chronic obstructive pulmonary disease with (acute) exacerbation: Secondary | ICD-10-CM | POA: Diagnosis not present

## 2020-03-31 DIAGNOSIS — E1140 Type 2 diabetes mellitus with diabetic neuropathy, unspecified: Secondary | ICD-10-CM | POA: Diagnosis not present

## 2020-03-31 DIAGNOSIS — Z794 Long term (current) use of insulin: Secondary | ICD-10-CM | POA: Diagnosis not present

## 2020-03-31 DIAGNOSIS — I10 Essential (primary) hypertension: Secondary | ICD-10-CM | POA: Diagnosis not present

## 2020-03-31 DIAGNOSIS — I739 Peripheral vascular disease, unspecified: Secondary | ICD-10-CM

## 2020-03-31 DIAGNOSIS — L84 Corns and callosities: Secondary | ICD-10-CM

## 2020-03-31 NOTE — Patient Instructions (Signed)
Visit Information  Goals Addressed              This Visit's Progress     Patient Stated   .  "I need diabetic shoes" (pt-stated)        CARE PLAN ENTRY (see longitudinal plan of care for additional care plan information)  Current Barriers:  . Care Coordination needs related to diabetic shoes in a patient with diabetes (disease states)  Nurse Case Manager Clinical Goal(s):  [name] Over the next 30 days, patient will work with PCP to address needs related to diabetic shoes  Interventions:  . Inter-disciplinary care team collaboration (see longitudinal plan of care) . Chart reviewed . Collaborated with [place] [phone] o They sent order back by fax on 03/25/20 because it was missing a provider's signature . Collaborated with [name], LPN regarding order o Verified that she faxed the order back on 03/28/20 at 5:46 and that she has a confirmation o Advised that they have not received it and requested. She will fax it again.  [name] Updated [name] that they should be receiving the order by fax again today. Requested that they call me at [phone] if not received by the end of the day today  Patient Self Care Activities:  . Patient verbalizes understanding of plan to schedule appt at PCP office for evaluation for diabetic shoes . Performs ADL's independently . Performs IADL's independently  Please see past updates related to this goal by clicking on the "Past Updates" button in the selected goal         Patient verbalizes understanding of instructions provided today.   Follow-up Plan The care management team will reach out to the patient again over the next 3 days.   [name], BSN, RN-BC Embedded Chronic Care Manager [facility] / [facility] Care Management Direct Dial: [phone]

## 2020-03-31 NOTE — Chronic Care Management (AMB) (Signed)
  Chronic Care Management   Follow Up Note   03/31/2020 Name: Todd Zhang MRN: [id number] DOB: 10/31/1945  Referred by: [name], FNP Reason for referral : Chronic Care Management (RN Care Coordination)   Todd Zhang is a 74 y.o. year old male who is a primary care patient of [name], FNP. The CCM team was consulted for assistance with chronic disease management and care coordination needs.    Review of patient status, including review of consultants reports, relevant laboratory and other test results, and collaboration with appropriate care team members and the patient's provider was performed as part of comprehensive patient evaluation and provision of chronic care management services.     RN Care Plan             This Visit's Progress     Patient Stated   .  "I need diabetic shoes" (pt-stated)        CARE PLAN ENTRY (see longitudinal plan of care for additional care plan information)  Current Barriers:  . Care Coordination needs related to diabetic shoes in a patient with diabetes (disease states)  Nurse Case Manager Clinical Goal(s):  [name] Over the next 30 days, patient will work with PCP to address needs related to diabetic shoes  Interventions:  . Inter-disciplinary care team collaboration (see longitudinal plan of care) . Chart reviewed . Collaborated with [place] [phone] o They sent order back by fax on 03/25/20 because it was missing a provider's signature . Collaborated with [name], LPN regarding order o Verified that she faxed the order back on 03/28/20 at 5:46 and that she has a confirmation o Advised that they have not received it and requested. She will fax it again.  [name] Updated [name] that they should be receiving the order by fax again today. Requested that they call me at [phone] if not received by the end of the day today  Patient Self Care Activities:  . Patient verbalizes understanding of  plan to schedule appt at PCP office for evaluation for diabetic shoes . Performs ADL's independently . Performs IADL's independently  Please see past updates related to this goal by clicking on the "Past Updates" button in the selected goal          Plan:   The care management team will reach out to the patient again over the next 3 days.    [name], BSN, RN-BC Embedded Chronic Care Manager [facility] / [facility] Care Management Direct Dial: [phone]

## 2020-04-03 ENCOUNTER — Ambulatory Visit: Payer: Medicare Other | Admitting: *Deleted

## 2020-04-03 DIAGNOSIS — I739 Peripheral vascular disease, unspecified: Secondary | ICD-10-CM

## 2020-04-03 DIAGNOSIS — I10 Essential (primary) hypertension: Secondary | ICD-10-CM

## 2020-04-03 DIAGNOSIS — E1140 Type 2 diabetes mellitus with diabetic neuropathy, unspecified: Secondary | ICD-10-CM

## 2020-04-03 NOTE — Chronic Care Management (AMB) (Signed)
  Chronic Care Management   Follow-up Outreach  04/03/2020 Name: Todd Zhang MRN: [id number] DOB: [birth year]  Referred by: [name], FNP Reason for referral : Chronic Care Management (RN follow up)   An unsuccessful telephone follow-up was attempted today. The patient was referred to the case management team for assistance with care management and care coordination. Order for diabetic shoes as faxed to [name] in [place].  Follow Up Plan: The care management team will reach out to the patient again over the next 30 days.   [name], BSN, RN-BC Embedded Chronic Care Manager [facility] / [facility] Care Management Direct Dial: [phone]

## 2020-04-10 ENCOUNTER — Ambulatory Visit (INDEPENDENT_AMBULATORY_CARE_PROVIDER_SITE_OTHER): Payer: Medicare Other | Admitting: Nurse Practitioner

## 2020-04-10 ENCOUNTER — Other Ambulatory Visit: Payer: Self-pay

## 2020-04-10 ENCOUNTER — Encounter: Payer: Self-pay | Admitting: Nurse Practitioner

## 2020-04-10 VITALS — BP 111/71 | HR 94 | Temp 98.0°F | Resp 20 | Ht 72.0 in | Wt 221.0 lb

## 2020-04-10 DIAGNOSIS — G8929 Other chronic pain: Secondary | ICD-10-CM

## 2020-04-10 DIAGNOSIS — F411 Generalized anxiety disorder: Secondary | ICD-10-CM

## 2020-04-10 DIAGNOSIS — E7800 Pure hypercholesterolemia, unspecified: Secondary | ICD-10-CM | POA: Diagnosis not present

## 2020-04-10 DIAGNOSIS — M545 Low back pain, unspecified: Secondary | ICD-10-CM

## 2020-04-10 DIAGNOSIS — Z794 Long term (current) use of insulin: Secondary | ICD-10-CM

## 2020-04-10 DIAGNOSIS — K219 Gastro-esophageal reflux disease without esophagitis: Secondary | ICD-10-CM

## 2020-04-10 DIAGNOSIS — J441 Chronic obstructive pulmonary disease with (acute) exacerbation: Secondary | ICD-10-CM

## 2020-04-10 DIAGNOSIS — I480 Paroxysmal atrial fibrillation: Secondary | ICD-10-CM | POA: Diagnosis not present

## 2020-04-10 DIAGNOSIS — I10 Essential (primary) hypertension: Secondary | ICD-10-CM | POA: Diagnosis not present

## 2020-04-10 DIAGNOSIS — E1140 Type 2 diabetes mellitus with diabetic neuropathy, unspecified: Secondary | ICD-10-CM | POA: Diagnosis not present

## 2020-04-10 DIAGNOSIS — I482 Chronic atrial fibrillation, unspecified: Secondary | ICD-10-CM

## 2020-04-10 DIAGNOSIS — M5030 Other cervical disc degeneration, unspecified cervical region: Secondary | ICD-10-CM

## 2020-04-10 LAB — CBC WITH DIFFERENTIAL/PLATELET
Basophils Absolute: 0 10*3/uL (ref 0.0–0.2)
Basos: 0 %
EOS (ABSOLUTE): 0.1 10*3/uL (ref 0.0–0.4)
Eos: 2 %
Hematocrit: 37.4 % — ABNORMAL LOW (ref 37.5–51.0)
Hemoglobin: 12.7 g/dL — ABNORMAL LOW (ref 13.0–17.7)
Immature Grans (Abs): 0 10*3/uL (ref 0.0–0.1)
Immature Granulocytes: 0 %
Lymphocytes Absolute: 2.5 10*3/uL (ref 0.7–3.1)
Lymphs: 33 %
MCH: 31.2 pg (ref 26.6–33.0)
MCHC: 34 g/dL (ref 31.5–35.7)
MCV: 92 fL (ref 79–97)
Monocytes Absolute: 0.6 10*3/uL (ref 0.1–0.9)
Monocytes: 8 %
Neutrophils Absolute: 4.2 10*3/uL (ref 1.4–7.0)
Neutrophils: 57 %
Platelets: 196 10*3/uL (ref 150–450)
RBC: 4.07 x10E6/uL — ABNORMAL LOW (ref 4.14–5.80)
RDW: 12.1 % (ref 11.6–15.4)
WBC: 7.5 10*3/uL (ref 3.4–10.8)

## 2020-04-10 LAB — CMP14+EGFR
ALT: 17 IU/L (ref 0–44)
AST: 24 IU/L (ref 0–40)
Albumin/Globulin Ratio: 2.1 (ref 1.2–2.2)
Albumin: 4.6 g/dL (ref 3.7–4.7)
Alkaline Phosphatase: 33 IU/L — ABNORMAL LOW (ref 48–121)
BUN/Creatinine Ratio: 17 (ref 10–24)
BUN: 35 mg/dL — ABNORMAL HIGH (ref 8–27)
Bilirubin Total: 0.4 mg/dL (ref 0.0–1.2)
CO2: 21 mmol/L (ref 20–29)
Calcium: 9.6 mg/dL (ref 8.6–10.2)
Chloride: 102 mmol/L (ref 96–106)
Creatinine, Ser: 2.02 mg/dL — ABNORMAL HIGH (ref 0.76–1.27)
GFR calc Af Amer: 36 mL/min/{1.73_m2} — ABNORMAL LOW (ref >59)
GFR calc non Af Amer: 32 mL/min/{1.73_m2} — ABNORMAL LOW (ref >59)
Globulin, Total: 2.2 g/dL (ref 1.5–4.5)
Glucose: 158 mg/dL — ABNORMAL HIGH (ref 65–99)
Potassium: 4.8 mmol/L (ref 3.5–5.2)
Sodium: 138 mmol/L (ref 134–144)
Total Protein: 6.8 g/dL (ref 6.0–8.5)

## 2020-04-10 LAB — LIPID PANEL
Chol/HDL Ratio: 4.6 ratio (ref 0.0–5.0)
Cholesterol, Total: 205 mg/dL — ABNORMAL HIGH (ref 100–199)
HDL: 45 mg/dL (ref >39)
LDL Chol Calc (NIH): 125 mg/dL — ABNORMAL HIGH (ref 0–99)
Triglycerides: 197 mg/dL — ABNORMAL HIGH (ref 0–149)
VLDL Cholesterol Cal: 35 mg/dL (ref 5–40)

## 2020-04-10 LAB — BAYER DCA HB A1C WAIVED: HB A1C (BAYER DCA - WAIVED): 6.7 % (ref <7.0)

## 2020-04-10 MED ORDER — XARELTO 20 MG PO TABS: 20.0000 mg | ORAL_TABLET | Freq: Every day | ORAL | 5 refills | Status: DC

## 2020-04-10 MED ORDER — PRAVASTATIN SODIUM 40 MG PO TABS: 40.0000 mg | ORAL_TABLET | Freq: Every day | ORAL | 1 refills | Status: DC

## 2020-04-10 MED ORDER — BENAZEPRIL HCL 5 MG PO TABS: 5.0000 mg | ORAL_TABLET | Freq: Every day | ORAL | 1 refills | Status: DC

## 2020-04-10 MED ORDER — GABAPENTIN 300 MG PO CAPS: ORAL_CAPSULE | ORAL | 5 refills | Status: DC

## 2020-04-10 MED ORDER — OMEPRAZOLE 20 MG PO CPDR: 20.0000 mg | DELAYED_RELEASE_CAPSULE | Freq: Every day | ORAL | 0 refills | Status: DC

## 2020-04-10 MED ORDER — FENOFIBRATE 145 MG PO TABS: 145.0000 mg | ORAL_TABLET | Freq: Every day | ORAL | 1 refills | Status: DC

## 2020-04-10 MED ORDER — METOPROLOL SUCCINATE ER 50 MG PO TB24: ORAL_TABLET | ORAL | 1 refills | Status: DC

## 2020-04-10 MED ORDER — INSULIN REGULAR HUMAN 100 UNIT/ML IJ SOLN: INTRAMUSCULAR | 5 refills | Status: DC

## 2020-04-10 MED ORDER — METFORMIN HCL 500 MG PO TABS: 500.0000 mg | ORAL_TABLET | Freq: Two times a day (BID) | ORAL | 0 refills | Status: DC

## 2020-04-10 NOTE — Patient Instructions (Signed)
Diabetes Mellitus and Foot Care Foot care is an important part of your health, especially when you have diabetes. Diabetes may cause you to have problems because of poor blood flow (circulation) to your feet and legs, which can cause your skin to:  Become thinner and drier.  Break more easily.  Heal more slowly.  Peel and crack. You may also have nerve damage (neuropathy) in your legs and feet, causing decreased feeling in them. This means that you may not notice minor injuries to your feet that could lead to more serious problems. Noticing and addressing any potential problems early is the best way to prevent future foot problems. How to care for your feet Foot hygiene  Wash your feet daily with warm water and mild soap. Do not use hot water. Then, pat your feet and the areas between your toes until they are completely dry. Do not soak your feet as this can dry your skin.  Trim your toenails straight across. Do not dig under them or around the cuticle. File the edges of your nails with an emery board or nail file.  Apply a moisturizing lotion or petroleum jelly to the skin on your feet and to dry, brittle toenails. Use lotion that does not contain alcohol and is unscented. Do not apply lotion between your toes. Shoes and socks  Wear clean socks or stockings every day. Make sure they are not too tight. Do not wear knee-high stockings since they may decrease blood flow to your legs.  Wear shoes that fit properly and have enough cushioning. Always look in your shoes before you put them on to be sure there are no objects inside.  To break in new shoes, wear them for just a few hours a day. This prevents injuries on your feet. Wounds, scrapes, corns, and calluses  Check your feet daily for blisters, cuts, bruises, sores, and redness. If you cannot see the bottom of your feet, use a mirror or ask someone for help.  Do not cut corns or calluses or try to remove them with medicine.  If you  find a minor scrape, cut, or break in the skin on your feet, keep it and the skin around it clean and dry. You may clean these areas with mild soap and water. Do not clean the area with peroxide, alcohol, or iodine.  If you have a wound, scrape, corn, or callus on your foot, look at it several times a day to make sure it is healing and not infected. Check for: ? Redness, swelling, or pain. ? Fluid or blood. ? Warmth. ? Pus or a bad smell. General instructions  Do not cross your legs. This may decrease blood flow to your feet.  Do not use heating pads or hot water bottles on your feet. They may burn your skin. If you have lost feeling in your feet or legs, you may not know this is happening until it is too late.  Protect your feet from hot and cold by wearing shoes, such as at the beach or on hot pavement.  Schedule a complete foot exam at least once a year (annually) or more often if you have foot problems. If you have foot problems, report any cuts, sores, or bruises to your health care provider immediately. Contact a health care provider if:  You have a medical condition that increases your risk of infection and you have any cuts, sores, or bruises on your feet.  You have an injury that is not   healing.  You have redness on your legs or feet.  You feel burning or tingling in your legs or feet.  You have pain or cramps in your legs and feet.  Your legs or feet are numb.  Your feet always feel cold.  You have pain around a toenail. Get help right away if:  You have a wound, scrape, corn, or callus on your foot and: ? You have pain, swelling, or redness that gets worse. ? You have fluid or blood coming from the wound, scrape, corn, or callus. ? Your wound, scrape, corn, or callus feels warm to the touch. ? You have pus or a bad smell coming from the wound, scrape, corn, or callus. ? You have a fever. ? You have a red line going up your leg. Summary  Check your feet every day  for cuts, sores, red spots, swelling, and blisters.  Moisturize feet and legs daily.  Wear shoes that fit properly and have enough cushioning.  If you have foot problems, report any cuts, sores, or bruises to your health care provider immediately.  Schedule a complete foot exam at least once a year (annually) or more often if you have foot problems. This information is not intended to replace advice given to you by your health care provider. Make sure you discuss any questions you have with your health care provider. Document Revised: 05/23/2019 Document Reviewed: 10/01/2016 [employer] Patient Education  2020 [employer]  

## 2020-04-10 NOTE — Progress Notes (Signed)
Subjective:    Patient ID: Todd Zhang, male    DOB: 13-Jul-1946, 74 y.o.   MRN: [id number]   Chief Complaint: Medical Management of Chronic Issues    HPI:  1. Type 2 diabetes mellitus with diabetic neuropathy, with long-term current use of insulin (HCC) He does not check hiss blood sugars very often. He has a very poor appetite Lab Results  Component Value Date   HGBA1C 7.1 (H) 01/10/2020     2. Essential hypertension No c/o chest pain, sob or headache. Does not check bloodpressure at home. BP Readings from Last 3 Encounters:  04/10/20 111/71  03/04/20 (!) 144/73  01/10/20 105/61     3. Pure hypercholesterolemia Does watch diet but odes no exercise. Lab Results  Component Value Date   CHOL 168 01/10/2020   HDL 44 01/10/2020   LDLCALC 86 01/10/2020   TRIG 224 (H) 01/10/2020   CHOLHDL 3.8 01/10/2020     4. Paroxysmal atrial fibrillation (HCC) Is on xeralto without any bleeding problems. Denies any palpitations or heart racing.  5. COPD exacerbation ([place]) Is not on any inhalers. He denies smoking  6. Gastroesophageal reflux disease without esophagitis Is on omeprazole dialy and that seems to work well to keep symptoms under control.  7. Generalized anxiety disorder stays anxious. Is worried about  Not having a place to live. He is currenlty living with a friend and is waiting for government housing to become available. GAD 7 : Generalized Anxiety Score 04/10/2020 01/10/2020 06/13/2019  Nervous, Anxious, on Edge _0 Control/stop worrying _1 Worry too much - different things _2 Trouble relaxing 0 0 1  Restless 0 0 1  Easily annoyed or irritable 0 0 0  Afraid - awful might happen 0 0 0  Total GAD 7 Score _3 Anxiety Difficulty Not difficult at all Somewhat difficult Somewhat difficult       8. DDD (degenerative disc disease), cervical Has chronic back pain. Is on neurontin daily and that seems to help    Outpatient Encounter Medications  as of 04/10/2020  Medication Sig  . ACCU-CHEK AVIVA PLUS test strip USE TO check blood glucose TWICE DAILY  . benazepril (LOTENSIN) 5 MG tablet Take 1 tablet (5 mg total) by mouth daily.  . fenofibrate (TRICOR) 145 MG tablet Take 1 tablet (145 mg total) by mouth daily.  [name] gabapentin (NEURONTIN) 300 MG capsule TAKE 1 CAPSULE BY MOUTH THREE TIMES DAILY FOR PAIN  . insulin regular (HUMULIN R) 100 units/mL injection INJECT 10 UNITS INTO THE SKIN THREE TIMES DAILY BEFORE MEALS  . Insulin Syringe-Needle U-100 (GLOBAL INJECT EASE INSULIN SYR) 31G X 5/16" 0.3 ML MISC Use with insulin Dx E11.9  . Multiple Vitamin (MULTIVITAMIN) tablet Take 1 tablet by mouth daily.  . pravastatin (PRAVACHOL) 40 MG tablet Take 1 tablet (40 mg total) by mouth daily.  . tamsulosin (FLOMAX) 0.4 MG CAPS capsule Take 1 capsule (0.4 mg total) by mouth daily.  . vitamin B-12 (CYANOCOBALAMIN) 500 MCG tablet Take 500 mcg by mouth daily.  Alveda Reasons 20 MG TABS tablet Take 1 tablet (20 mg total) by mouth daily.  [name] alendronate (FOSAMAX) 70 MG tablet Take 1 tablet (70 mg total) by mouth once a week. Needs to be seen before next refill (Patient not taking: Reported on 04/10/2020)  . cetirizine (ZYRTEC) 10 MG tablet Take 10 mg by mouth daily. (Patient not taking: Reported on 04/10/2020)  . metFORMIN (  GLUCOPHAGE) 500 MG tablet Take 1 tablet (500 mg total) by mouth 2 (two) times daily. (Patient not taking: Reported on 04/10/2020)  . metoprolol succinate (TOPROL-XL) 50 MG 24 hr tablet TAKE 1 TABLET BY MOUTH DAILY, TAKE WITH FOOD OR IMMEDIATELY FOLLOWING A MEAL (Patient not taking: Reported on 04/10/2020)  . mupirocin ointment (BACTROBAN) 2 % APPLY TO LEFT GREAT TOE (Patient not taking: Reported on 04/10/2020)  . omeprazole (PRILOSEC) 20 MG capsule Take 1 capsule (20 mg total) by mouth daily. (Patient not taking: Reported on 04/10/2020)  . triamcinolone cream (KENALOG) 0.5 % APPLY TO THE AFFECTED AREA(S) THREE TIMES DAILY (Patient not taking: Reported  on 04/10/2020)   No facility-administered encounter medications on file as of 04/10/2020.    Past Surgical History:  Procedure Laterality Date  . AMPUTATION TOE    . KNEE SURGERY Left   . NOSE SURGERY      Family History  Problem Relation Age of Onset  . Stroke Mother   . Heart disease Mother        CABG  . Cancer Father     New complaints: None today  Social history: Lives with a firend and is waiting on government subsidized housing to come available.  Controlled substance contract: n/a    Review of Systems  Constitutional: Negative for diaphoresis.  Eyes: Negative for pain.  Respiratory: Negative for shortness of breath.   Cardiovascular: Negative for chest pain, palpitations and leg swelling.  Gastrointestinal: Negative for abdominal pain.  Endocrine: Negative for polydipsia.  Skin: Negative for rash.  Neurological: Negative for dizziness, weakness and headaches.  Hematological: Does not bruise/bleed easily.  All other systems reviewed and are negative.      Objective:   Physical Exam Vitals and nursing note reviewed.  Constitutional:      Appearance: Normal appearance. He is well-developed.  HENT:     Head: Normocephalic.     Nose: Nose normal.  Eyes:     Pupils: Pupils are equal, round, and reactive to light.  Neck:     Thyroid: No thyroid mass or thyromegaly.     Vascular: No carotid bruit or JVD.     Trachea: Phonation normal.  Cardiovascular:     Rate and Rhythm: Normal rate and regular rhythm.  Pulmonary:     Effort: Pulmonary effort is normal. No respiratory distress.     Breath sounds: Normal breath sounds.  Abdominal:     General: Bowel sounds are normal.     Palpations: Abdomen is soft.     Tenderness: There is no abdominal tenderness.  Musculoskeletal:        General: Normal range of motion.     Cervical back: Normal range of motion and neck supple.  Lymphadenopathy:     Cervical: No cervical adenopathy.  Skin:    General: Skin is  warm and dry.     Comments: Open sore left big toe  Neurological:     Mental Status: He is alert and oriented to person, place, and time.  Psychiatric:        Behavior: Behavior normal.        Thought Content: Thought content normal.        Judgment: Judgment normal.    BP 111/71   Pulse 94   Temp 98 F (36.7 C) (Temporal)   Resp 20   Ht 6' (1.829 m)   Wt (!) 221 lb (100.2 kg)   SpO2 97%   BMI 29.97 kg/m    hgba1c  6.7%     Assessment & Plan:  STARSKY NANNA comes in today with chief complaint of Medical Management of Chronic Issues   Diagnosis and orders addressed:  1. Type 2 diabetes mellitus with diabetic neuropathy, with long-term current use of insulin (HCC) Continue to watch carbs in diet - Bayer DCA Hb A1c Waived - metFORMIN (GLUCOPHAGE) 500 MG tablet; Take 1 tablet (500 mg total) by mouth 2 (two) times daily.  Dispense: 60 tablet; Refill: 0 - insulin regular (HUMULIN R) 100 units/mL injection; INJECT 10 UNITS INTO THE SKIN THREE TIMES DAILY BEFORE MEALS  Dispense: 10 mL; Refill: 5  2. Essential hypertension Low sodium diet - CBC with Differential/Platelet - CMP14+EGFR - metoprolol succinate (TOPROL-XL) 50 MG 24 hr tablet; TAKE 1 TABLET BY MOUTH DAILY, TAKE WITH FOOD OR IMMEDIATELY FOLLOWING A MEAL  Dispense: 90 tablet; Refill: 1 - benazepril (LOTENSIN) 5 MG tablet; Take 1 tablet (5 mg total) by mouth daily.  Dispense: 90 tablet; Refill: 1  3. Pure hypercholesterolemia Low fat diet - Lipid panel - pravastatin (PRAVACHOL) 40 MG tablet; Take 1 tablet (40 mg total) by mouth daily.  Dispense: 90 tablet; Refill: 1 - fenofibrate (TRICOR) 145 MG tablet; Take 1 tablet (145 mg total) by mouth daily.  Dispense: 90 tablet; Refill: 1  4. Paroxysmal atrial fibrillation (HCC) Continue xeralto as rx  5. COPD exacerbation ([place])  6. Gastroesophageal reflux disease without esophagitis Avoid spicy foods Do not eat 2 hours prior to bedtime - omeprazole (PRILOSEC) 20 MG  capsule; Take 1 capsule (20 mg total) by mouth daily.  Dispense: 30 capsule; Refill: 0  7. Generalized anxiety disorder Stress management  8. DDD (degenerative disc disease), cervical - gabapentin (NEURONTIN) 300 MG capsule; TAKE 1 CAPSULE BY MOUTH THREE TIMES DAILY FOR PAIN  Dispense: 90 capsule; Refill: 5  9. Chronic atrial fibrillation (HCC) - XARELTO 20 MG TABS tablet; Take 1 tablet (20 mg total) by mouth daily.  Dispense: 30 tablet; Refill: 5  10. Chronic low back pain without sciatica, unspecified back pain laterality - gabapentin (NEURONTIN) 300 MG capsule; TAKE 1 CAPSULE BY MOUTH THREE TIMES DAILY FOR PAIN  Dispense: 90 capsule; Refill: 5  * keep follow up with DR., Irving Shows for wound on left great toe  Labs pending Health Maintenance reviewed Diet and exercise encouraged  Follow up plan: 3 months   [name] [name], FNP

## 2020-04-25 ENCOUNTER — Ambulatory Visit (INDEPENDENT_AMBULATORY_CARE_PROVIDER_SITE_OTHER): Payer: Medicare Other | Admitting: Licensed Clinical Social Worker

## 2020-04-25 DIAGNOSIS — I10 Essential (primary) hypertension: Secondary | ICD-10-CM

## 2020-04-25 DIAGNOSIS — R3914 Feeling of incomplete bladder emptying: Secondary | ICD-10-CM

## 2020-04-25 DIAGNOSIS — M5030 Other cervical disc degeneration, unspecified cervical region: Secondary | ICD-10-CM

## 2020-04-25 DIAGNOSIS — I739 Peripheral vascular disease, unspecified: Secondary | ICD-10-CM

## 2020-04-25 DIAGNOSIS — Z794 Long term (current) use of insulin: Secondary | ICD-10-CM

## 2020-04-25 DIAGNOSIS — K219 Gastro-esophageal reflux disease without esophagitis: Secondary | ICD-10-CM

## 2020-04-25 DIAGNOSIS — J441 Chronic obstructive pulmonary disease with (acute) exacerbation: Secondary | ICD-10-CM

## 2020-04-25 DIAGNOSIS — E1140 Type 2 diabetes mellitus with diabetic neuropathy, unspecified: Secondary | ICD-10-CM

## 2020-04-25 DIAGNOSIS — I480 Paroxysmal atrial fibrillation: Secondary | ICD-10-CM

## 2020-04-25 DIAGNOSIS — N401 Enlarged prostate with lower urinary tract symptoms: Secondary | ICD-10-CM

## 2020-04-25 DIAGNOSIS — F411 Generalized anxiety disorder: Secondary | ICD-10-CM

## 2020-04-25 NOTE — Chronic Care Management (AMB) (Signed)
Chronic Care Management    Clinical Social Work Follow Up Note  04/25/2020 Name: Todd Zhang MRN: [id number] DOB: Feb 16, 1946  Todd Zhang is a 74 y.o. year old male who is a primary care patient of [name], FNP. The CCM team was consulted for assistance with [name] .   Review of patient status, including review of consultants reports, other relevant assessments, and collaboration with appropriate care team members and the patient's provider was performed as part of comprehensive patient evaluation and provision of chronic care management services.    SDOH (Social Determinants of Health) assessments performed: No;risk for tobacco use; risk for depression; risk for food insecurity; risk for housing needs    Chronic Care Management from 06/13/2019 in [facility]  PHQ-9 Total Score 6      GAD 7 : Generalized Anxiety Score 04/10/2020 01/10/2020 06/13/2019  Nervous, Anxious, on Edge 1 1 1   Control/stop worrying 1 1 1   Worry too much - different things 1 1 1   Trouble relaxing 0 0 1  Restless 0 0 1  Easily annoyed or irritable 0 0 0  Afraid - awful might happen 0 0 0  Total GAD 7 Score 3 3 5   Anxiety Difficulty Not difficult at all Somewhat difficult Somewhat difficult    Outpatient Encounter Medications as of 04/25/2020  Medication Sig  . ACCU-CHEK AVIVA PLUS test strip USE TO check blood glucose TWICE DAILY  . alendronate (FOSAMAX) 70 MG tablet Take 1 tablet (70 mg total) by mouth once a week. Needs to be seen before next refill (Patient not taking: Reported on 04/10/2020)  . benazepril (LOTENSIN) 5 MG tablet Take 1 tablet (5 mg total) by mouth daily.  . cetirizine (ZYRTEC) 10 MG tablet Take 10 mg by mouth daily. (Patient not taking: Reported on 04/10/2020)  . fenofibrate (TRICOR) 145 MG tablet Take 1 tablet (145 mg total) by mouth daily.  gabapentin (NEURONTIN) 300 MG capsule TAKE 1 CAPSULE BY MOUTH THREE TIMES DAILY FOR PAIN  .  insulin regular (HUMULIN R) 100 units/mL injection INJECT 10 UNITS INTO THE SKIN THREE TIMES DAILY BEFORE MEALS  . Insulin Syringe-Needle U-100 (GLOBAL INJECT EASE INSULIN SYR) 31G X 5/16" 0.3 ML MISC Use with insulin Dx E11.9  . metFORMIN (GLUCOPHAGE) 500 MG tablet Take 1 tablet (500 mg total) by mouth 2 (two) times daily.  . metoprolol succinate (TOPROL-XL) 50 MG 24 hr tablet TAKE 1 TABLET BY MOUTH DAILY, TAKE WITH FOOD OR IMMEDIATELY FOLLOWING A MEAL  . Multiple Vitamin (MULTIVITAMIN) tablet Take 1 tablet by mouth daily.  . mupirocin ointment (BACTROBAN) 2 % APPLY TO LEFT GREAT TOE (Patient not taking: Reported on 04/10/2020)  . omeprazole (PRILOSEC) 20 MG capsule Take 1 capsule (20 mg total) by mouth daily.  . pravastatin (PRAVACHOL) 40 MG tablet Take 1 tablet (40 mg total) by mouth daily.  . tamsulosin (FLOMAX) 0.4 MG CAPS capsule Take 1 capsule (0.4 mg total) by mouth daily.  04/12/2020 triamcinolone cream (KENALOG) 0.5 % APPLY TO THE AFFECTED AREA(S) THREE TIMES DAILY (Patient not taking: Reported on 04/10/2020)  . vitamin B-12 (CYANOCOBALAMIN) 500 MCG tablet Take 500 mcg by mouth daily.  6/16 20 MG TABS tablet Take 1 tablet (20 mg total) by mouth daily.   No facility-administered encounter medications on file as of 04/25/2020.    Goals    .  "I need stable housing" (pt-stated)      Current Barriers:  . Lacks caregiver support.  [name]  Financial Constraints in patient with Chronic Diagnoses of Type 2 DM, HTN, Atrial Fibrillation, GERD, COPD, DDD and GAD . Literacy barriers . transportation  Nurse Case Manager Clinical Goal(s):  [name] Over the next 30 days, patient will work with CCM team to address needs related to homelessness  Interventions:   .LCSW has worked with patient regarding housing assistance.  LCSW talked with client about current food supply of client  Talkedwith client about sleeping challenges of client  Talkedwith client about pain issues of client  Talked  withclientabout needs of client  Talked with client about upcoming client appointments  Talked with client about ambulation challenges  Client and LCSW talked about condition of his toes (he said he has some sores        on his feet)  Talked with client about housing applications of client  Talked with client about transport needs of client . [name] Collaborated with Mariam Dollar LPN at [facility] about nursing needs of client  Patient Self Care Activities:  . Performs ADL's independently . Performs IADL's independently  See previous goal updates       Follow Up Plan: LCSW to call client in next 4 weeks to assess the housing needs of client at that time  Kelton Pillar.Darean Rote MSW, LCSW Licensed Clinical Social Worker [facility] Care Management [phone]

## 2020-04-25 NOTE — Patient Instructions (Addendum)
Licensed Clinical Social Worker Visit Information  Goals we discussed today:    "I need stable housing" (pt-stated)          Current Barriers:   Lacks caregiver support.   Financial Constraints in patient with Chronic Diagnoses of Type 2 DM, HTN, Atrial Fibrillation, GERD, COPD, DDD and GAD  Literacy barriers  transportation  Nurse Case Manager Clinical Goal(s):   Over the next 30 days, patient will work with CCM team to address needs related to homelessness  Interventions:   .LCSW has worked with patient regarding housing assistance.  LCSW talked with client about current food supply of client  Talkedwith client about sleeping challenges of client  Talkedwithclient about pain issues of client  Talked withclientabout needs of client  Talked with client about upcoming client appointments  Talked with client about ambulation challenges  Client and LCSW talked about condition of his toes (he said he has some sores on his feet)  Talked with client about housing applications of client  Talked with client about transport needs of client  . Collaborated with Mariam Dollar LPN at [facility] about nursing needs of client  Patient Self Care Activities:   Performs ADL's independently  Performs IADL's independently  See previous goal updates       Follow Up Plan: LCSW to call client in next 4 weeks to assess the housing needs of client at that time  Materials Provided: No  The patient verbalized understanding of instructions provided today and declined a print copy of patient instruction materials.   Kelton Pillar.Todd Zhang MSW, LCSW Licensed Clinical Social Worker [facility] Care Management [phone]

## 2020-05-02 ENCOUNTER — Ambulatory Visit: Payer: Medicare Other | Admitting: *Deleted

## 2020-05-02 ENCOUNTER — Telehealth: Payer: Self-pay | Admitting: *Deleted

## 2020-05-02 DIAGNOSIS — I480 Paroxysmal atrial fibrillation: Secondary | ICD-10-CM | POA: Diagnosis not present

## 2020-05-02 DIAGNOSIS — J441 Chronic obstructive pulmonary disease with (acute) exacerbation: Secondary | ICD-10-CM | POA: Diagnosis not present

## 2020-05-02 DIAGNOSIS — I10 Essential (primary) hypertension: Secondary | ICD-10-CM | POA: Diagnosis not present

## 2020-05-02 DIAGNOSIS — E1140 Type 2 diabetes mellitus with diabetic neuropathy, unspecified: Secondary | ICD-10-CM | POA: Diagnosis not present

## 2020-05-02 DIAGNOSIS — N1832 Chronic kidney disease, stage 3b: Secondary | ICD-10-CM

## 2020-05-02 DIAGNOSIS — N401 Enlarged prostate with lower urinary tract symptoms: Secondary | ICD-10-CM

## 2020-05-02 DIAGNOSIS — Z794 Long term (current) use of insulin: Secondary | ICD-10-CM

## 2020-05-02 DIAGNOSIS — R3914 Feeling of incomplete bladder emptying: Secondary | ICD-10-CM

## 2020-05-02 DIAGNOSIS — I739 Peripheral vascular disease, unspecified: Secondary | ICD-10-CM

## 2020-05-02 MED ORDER — ACCU-CHEK AVIVA PLUS VI STRP: ORAL_STRIP | 11 refills | Status: DC

## 2020-05-02 NOTE — Chronic Care Management (AMB) (Signed)
Chronic Care Management   Follow Up Note   05/02/2020 Name: Todd Zhang MRN: [id number] DOB: October 16, 1945  Referred by: [name], FNP Reason for referral : Chronic Care Management (RN follow up)   Todd Zhang is a 74 y.o. year old male who is a primary care patient of [name], FNP. The CCM team was consulted for assistance with chronic disease management and care coordination needs.    Review of patient status, including review of consultants reports, relevant laboratory and other test results, and collaboration with appropriate care team members and the patient's provider was performed as part of comprehensive patient evaluation and provision of chronic care management services.    I talked with Todd Zhang by telephone today regarding management of his chronic medical conditions.   SDOH (Social Determinants of Health) assessments performed: No See Care Plan activities for detailed interventions related to [name]     Outpatient Encounter Medications as of 05/02/2020  Medication Sig   ACCU-CHEK AVIVA PLUS test strip USE TO check blood glucose TWICE DAILY   alendronate (FOSAMAX) 70 MG tablet Take 1 tablet (70 mg total) by mouth once a week. Needs to be seen before next refill (Patient not taking: Reported on 04/10/2020)   cetirizine (ZYRTEC) 10 MG tablet Take 10 mg by mouth daily. (Patient not taking: Reported on 04/10/2020)   fenofibrate (TRICOR) 145 MG tablet Take 1 tablet (145 mg total) by mouth daily.   gabapentin (NEURONTIN) 300 MG capsule TAKE 1 CAPSULE BY MOUTH THREE TIMES DAILY FOR PAIN   insulin regular (HUMULIN R) 100 units/mL injection INJECT 10 UNITS INTO THE SKIN THREE TIMES DAILY BEFORE MEALS   Insulin Syringe-Needle U-100 (GLOBAL INJECT EASE INSULIN SYR) 31G X 5/16" 0.3 ML MISC Use with insulin Dx E11.9   metFORMIN (GLUCOPHAGE) 500 MG tablet Take 1 tablet (500 mg total) by mouth 2 (two) times daily.   metoprolol succinate (TOPROL-XL) 50 MG  24 hr tablet TAKE 1 TABLET BY MOUTH DAILY, TAKE WITH FOOD OR IMMEDIATELY FOLLOWING A MEAL   Multiple Vitamin (MULTIVITAMIN) tablet Take 1 tablet by mouth daily.   mupirocin ointment (BACTROBAN) 2 % APPLY TO LEFT GREAT TOE (Patient not taking: Reported on 04/10/2020)   omeprazole (PRILOSEC) 20 MG capsule Take 1 capsule (20 mg total) by mouth daily.   pravastatin (PRAVACHOL) 40 MG tablet Take 1 tablet (40 mg total) by mouth daily.   tamsulosin (FLOMAX) 0.4 MG CAPS capsule Take 1 capsule (0.4 mg total) by mouth daily.   triamcinolone cream (KENALOG) 0.5 % APPLY TO THE AFFECTED AREA(S) THREE TIMES DAILY (Patient not taking: Reported on 04/10/2020)   vitamin B-12 (CYANOCOBALAMIN) 500 MCG tablet Take 500 mcg by mouth daily.   XARELTO 20 MG TABS tablet Take 1 tablet (20 mg total) by mouth daily.   [DISCONTINUED] benazepril (LOTENSIN) 5 MG tablet Take 1 tablet (5 mg total) by mouth daily.   No facility-administered encounter medications on file as of 05/02/2020.     Lab Results  Component Value Date   HGBA1C 6.7 04/10/2020   HGBA1C 7.1 (H) 01/10/2020   HGBA1C 6.1 08/24/2019   Lab Results  Component Value Date   LDLCALC 125 (H) 04/10/2020   CREATININE 2.02 (H) 04/10/2020    RN Care Plan             This Visit's Progress     Patient Stated     "I need diabetic shoes" (pt-stated)        CARE PLAN ENTRY (see  longitudinal plan of care for additional care plan information)  Current Barriers:   Care Coordination needs related to diabetic shoes in a patient with diabetes (disease states)  Nurse Case Manager Clinical Goal(s):   Over the next 30 days, patient will work with PCP to address needs related to diabetic shoes  Interventions:   Inter-disciplinary care team collaboration (see longitudinal plan of care)  Chart reviewed  Previously collaborated with [name] [phone]  Previously collaborated with [name], LPN regarding order  Discussed upcoming  appointment with [name], FNP RE: foot exam for diabetic shoe order o [name] rejected previous order because insurance is requiring more documentation  Patient Self Care Activities:   Performs ADL's independently  Performs IADL's independently  Please see past updates related to this goal by clicking on the "Past Updates" button in the selected goal        "I want my toe to heal up" (pt-stated)        Toe wound in a patient with diabetes and peripheral vascular disease.   Current Barriers:   Knowledge Deficits related to diabetic foot care and wound care  Chronic Disease Management support and education needs related to peripheral artery disease due to diabetes and hypertension  Nurse Case Manager Clinical Goal(s):   Over the next 30 days, patient will continue to see podiatrist every 3 weeks or as recommended  Interventions:   Chart reviewed  Talked with patient about wound care  Discussed podiatry appointments. Seeing [name] every 3 weeks.  Discussed upcoming appt with [name], FNP for foot exam to complete order for diabetic shoes. [name] rejected previous order stating that insurance required more documentation.   Encouraged patient to keep medical appointments and reach out to [facility] team as needed  Patient Self Care Activities:   Performs ADL's independently  Performs IADL's independently  Please see past updates related to this goal by clicking on the "Past Updates" button in the selected goal         "I want to keep my blood sugar under control" (pt-stated)        CARE PLAN ENTRY (see longitudinal plan of care for additional care plan information)  Current Barriers:   Chronic Disease Management support and education needs related to diabetes  Nurse Case Manager Clinical Goal(s):   Over the next 60 days, patient will work with RN Care Manager to address needs related to diabetes  Over the next 90  days, patient will keep all scheduled medical appointments  Interventions:   Inter-disciplinary care team collaboration (see longitudinal plan of care)  Evaluation of current treatment plan related to diabetes and patient's adherence to plan as established by provider.  Chart reviewed including recent office notes and lab results o Persistently elevated kidney functions and increasing creatinine level. Benazepril was d/c'd.   Reviewed medications with patient and discussed metformin and Humulin o Per patient he's only getting 50 test strips at a time. Needs new script with directions to test 4 times a day sent to [facility]  Chart reviewed including recent office notes and lab results o Last A1C was 6.8 on 04/10/20  Discussed home blood sugar readings o No readings below 70 o Checking up to 4 x a day o Usually around 110 fasting and 140 in the afternoon  Discussed diet  Discussed persistent nausea. Per patient he has this all of the time and it affects his appetite.  Collaboration with PCP regarding test strips  and nausea  Encouraged patient to increase water intake and decrease or eliminate intake of sugary juices/drinks  Reviewed upcoming appts: [name], FNP on 05/08/20 for foot exam  Discussed plans with patient for ongoing care management follow up and provided patient with direct contact information for care management team  Advised patient, providing education and rationale, to check cbg twice daily and PRN and record, calling [phone] for findings outside established parameters.    Patient Self Care Activities:   Performs ADL's independently  Performs IADL's independently  Please see past updates related to this goal by clicking on the "Past Updates" button in the selected goal      Plan:   The care management team will reach out to the patient again over the next 45 days.    [name], BSN, RN-BC Embedded Chronic Care Manager [facility] Care Management Direct Dial: [phone]

## 2020-05-02 NOTE — Telephone Encounter (Signed)
05/02/2020  Per patient, he is only receiving 50 test strips and is testing blood sugar 4 times a day. Needs new order for test strips sent to [facility] to reflect this. Previous order was for 100 strips and to test BID. QID is appropriate because of mealtime insulin use.   Patient also complains of persistent nausea that is affecting his appetite. Believe it is related to one of his medications but is unable to identify which one. Patient is a poor historian but says that a medication that had been previously discontinued was recently restarted and this is the one that is making him nauseas. I was unable to determine which medication he was talking about.    Forwarding to [facility] clinical staff for review and action.   [name], BSN, RN-BC Embedded Chronic Care Manager [facility] Care Management Direct Dial: [phone]

## 2020-05-02 NOTE — Telephone Encounter (Signed)
New rx sent over for test strips and pt has appt with MMM 05/08/20 and will discuss medications at that appt. Pt voiced understanding.

## 2020-05-02 NOTE — Patient Instructions (Signed)
Visit Information  Goals Addressed              This Visit's Progress     Patient Stated   .  "I need diabetic shoes" (pt-stated)        CARE PLAN ENTRY (see longitudinal plan of care for additional care plan information)  Current Barriers:  . Care Coordination needs related to diabetic shoes in a patient with diabetes (disease states)  Nurse Case Manager Clinical Goal(s):  [name] Over the next 30 days, patient will work with PCP to address needs related to diabetic shoes  Interventions:  . Inter-disciplinary care team collaboration (see longitudinal plan of care) . Chart reviewed . Previously collaborated with [name] [phone] . Previously collaborated with [name], LPN regarding order . Discussed upcoming appointment with [name], FNP RE: foot exam for diabetic shoe order o [name] rejected previous order because insurance is requiring more documentation  Patient Self Care Activities:  . Performs ADL's independently . Performs IADL's independently  Please see past updates related to this goal by clicking on the "Past Updates" button in the selected goal      .  "I want my toe to heal up" (pt-stated)        Toe wound in a patient with diabetes and peripheral vascular disease.   Current Barriers:  [name] Knowledge Deficits related to diabetic foot care and wound care . Chronic Disease Management support and education needs related to peripheral artery disease due to diabetes and hypertension  Nurse Case Manager Clinical Goal(s):  [name] Over the next 30 days, patient will continue to see podiatrist every 3 weeks or as recommended  Interventions:  . Chart reviewed . Talked with patient about wound care . Discussed podiatry appointments. Seeing [name] every 3 weeks. . Discussed upcoming appt with [name], FNP for foot exam to complete order for diabetic shoes. [name] rejected previous order stating that insurance  required more documentation.  . Encouraged patient to keep medical appointments and reach out to [name]  Patient Self Care Activities:  . Performs ADL's independently . Performs IADL's independently  Please see past updates related to this goal by clicking on the "Past Updates" button in the selected goal       .  "I want to keep my blood sugar under control" (pt-stated)        CARE PLAN ENTRY (see longitudinal plan of care for additional care plan information)  Current Barriers:  . Chronic Disease Management support and education needs related to diabetes  Nurse Case Manager Clinical Goal(s):  [name] Over the next 60 days, patient will work with [name] to address needs related to diabetes . Over the next 90 days, patient will keep all scheduled medical appointments  Interventions:  . Inter-disciplinary care team collaboration (see longitudinal plan of care) . Evaluation of current treatment plan related to diabetes and patient's adherence to plan as established by provider. . Chart reviewed including recent office notes and lab results o Persistently elevated kidney functions and increasing creatinine level. Benazepril was d/c'd.  . Reviewed medications with patient and discussed metformin and Humulin o Per patient he's only getting 50 test strips at a time. Needs new script with directions to test 4 times a day sent to [name] . Chart reviewed including recent office notes and lab results o Last A1C was 6.8 on 04/10/20 . Discussed home blood sugar readings o No readings below 70  o Checking up to 4 x a day o Usually around 110 fasting and 140 in the afternoon . Discussed diet . Discussed persistent nausea. Per patient he has this all of the time and it affects his appetite. . Collaboration with PCP regarding test strips and nausea . Encouraged patient to increase water intake and decrease or eliminate intake of sugary juices/drinks . Reviewed upcoming appts:  [name], FNP on 05/08/20 for foot exam . Discussed plans with patient for ongoing care management follow up and provided patient with direct contact information for care management team . Advised patient, providing education and rationale, to check cbg twice daily and PRN and record, calling [phone] for findings outside established parameters.    Patient Self Care Activities:  . Performs ADL's independently . Performs IADL's independently  Please see past updates related to this goal by clicking on the "Past Updates" button in the selected goal      .  COMPLETED: Improve Dry Nasal Passages (pt-stated)        Dry nasal passages and irritation in a patient with COPD.   Current Barriers:  [name] Knowledge Deficits related to how to improve dry nasal passages  Nurse Case Manager Clinical Goal(s):  [name] Over the next 14 days, patient will use OTC resources to improve dry nasal passages  Interventions:  . Advised that forced air heat will dry out nasal passages . Confirmed that patient does not use supplemental oxygen . Recommended saline nasal spray throughout the day as needed . Recommended Cool mist humidifier . Recommended keeping thermostat turned down to the lowest comfortable level  Patient Self Care Activities:  . Performs ADL's independently . Performs IADL's independently  Initial goal documentation       Other   .  COMPLETED: Pain Management        Chronic pain in patient with diabetic neuropathy and peripheral vascular disease.   Current Barriers:  . Corporate treasurer.  . Transportation barriers  Nurse Case Manager Clinical Goal(s):  [name] Over the next 7 days, patient will talk with RN Care Manager regarding pain management  Interventions:  . Chart reviewed . Talked with patient by phone  . Records received from [name] . Collaborated with PCP's nurse regarding records. Patient was dismissed from [name] for a cocaine positive drug  screen . Advised patient that [name] can manage primary care concerns but will not be able to manage pain  Patient Self Care Activities:  . Patient stated understanding . Performs ADL's independently . Performs IADL's independently  Please see past updates related to this goal by clicking on the "Past Updates" button in the selected goal         Patient verbalizes understanding of instructions provided today.   Follow-up Plan The care management team will reach out to the patient again over the next 45 days.   Todd Zhang, BSN, RN-BC Embedded Chronic Care Manager [facility] Care Management Direct Dial: [phone]

## 2020-05-06 ENCOUNTER — Other Ambulatory Visit: Payer: Self-pay | Admitting: Nurse Practitioner

## 2020-05-08 ENCOUNTER — Other Ambulatory Visit: Payer: Self-pay

## 2020-05-08 ENCOUNTER — Encounter: Payer: Self-pay | Admitting: Nurse Practitioner

## 2020-05-08 ENCOUNTER — Ambulatory Visit (INDEPENDENT_AMBULATORY_CARE_PROVIDER_SITE_OTHER): Payer: Medicare Other | Admitting: Nurse Practitioner

## 2020-05-08 VITALS — BP 107/68 | HR 78 | Temp 97.8°F | Resp 20 | Ht 72.0 in | Wt 224.0 lb

## 2020-05-08 DIAGNOSIS — Z794 Long term (current) use of insulin: Secondary | ICD-10-CM | POA: Diagnosis not present

## 2020-05-08 DIAGNOSIS — E1140 Type 2 diabetes mellitus with diabetic neuropathy, unspecified: Secondary | ICD-10-CM

## 2020-05-08 NOTE — Patient Instructions (Signed)
Diabetes Mellitus and Foot Care Foot care is an important part of your health, especially when you have diabetes. Diabetes may cause you to have problems because of poor blood flow (circulation) to your feet and legs, which can cause your skin to:  Become thinner and drier.  Break more easily.  Heal more slowly.  Peel and crack. You may also have nerve damage (neuropathy) in your legs and feet, causing decreased feeling in them. This means that you may not notice minor injuries to your feet that could lead to more serious problems. Noticing and addressing any potential problems early is the best way to prevent future foot problems. How to care for your feet Foot hygiene  Wash your feet daily with warm water and mild soap. Do not use hot water. Then, pat your feet and the areas between your toes until they are completely dry. Do not soak your feet as this can dry your skin.  Trim your toenails straight across. Do not dig under them or around the cuticle. File the edges of your nails with an emery board or nail file.  Apply a moisturizing lotion or petroleum jelly to the skin on your feet and to dry, brittle toenails. Use lotion that does not contain alcohol and is unscented. Do not apply lotion between your toes. Shoes and socks  Wear clean socks or stockings every day. Make sure they are not too tight. Do not wear knee-high stockings since they may decrease blood flow to your legs.  Wear shoes that fit properly and have enough cushioning. Always look in your shoes before you put them on to be sure there are no objects inside.  To break in new shoes, wear them for just a few hours a day. This prevents injuries on your feet. Wounds, scrapes, corns, and calluses  Check your feet daily for blisters, cuts, bruises, sores, and redness. If you cannot see the bottom of your feet, use a mirror or ask someone for help.  Do not cut corns or calluses or try to remove them with medicine.  If you  find a minor scrape, cut, or break in the skin on your feet, keep it and the skin around it clean and dry. You may clean these areas with mild soap and water. Do not clean the area with peroxide, alcohol, or iodine.  If you have a wound, scrape, corn, or callus on your foot, look at it several times a day to make sure it is healing and not infected. Check for: ? Redness, swelling, or pain. ? Fluid or blood. ? Warmth. ? Pus or a bad smell. General instructions  Do not cross your legs. This may decrease blood flow to your feet.  Do not use heating pads or hot water bottles on your feet. They may burn your skin. If you have lost feeling in your feet or legs, you may not know this is happening until it is too late.  Protect your feet from hot and cold by wearing shoes, such as at the beach or on hot pavement.  Schedule a complete foot exam at least once a year (annually) or more often if you have foot problems. If you have foot problems, report any cuts, sores, or bruises to your health care provider immediately. Contact a health care provider if:  You have a medical condition that increases your risk of infection and you have any cuts, sores, or bruises on your feet.  You have an injury that is not   healing.  You have redness on your legs or feet.  You feel burning or tingling in your legs or feet.  You have pain or cramps in your legs and feet.  Your legs or feet are numb.  Your feet always feel cold.  You have pain around a toenail. Get help right away if:  You have a wound, scrape, corn, or callus on your foot and: ? You have pain, swelling, or redness that gets worse. ? You have fluid or blood coming from the wound, scrape, corn, or callus. ? Your wound, scrape, corn, or callus feels warm to the touch. ? You have pus or a bad smell coming from the wound, scrape, corn, or callus. ? You have a fever. ? You have a red line going up your leg. Summary  Check your feet every day  for cuts, sores, red spots, swelling, and blisters.  Moisturize feet and legs daily.  Wear shoes that fit properly and have enough cushioning.  If you have foot problems, report any cuts, sores, or bruises to your health care provider immediately.  Schedule a complete foot exam at least once a year (annually) or more often if you have foot problems. This information is not intended to replace advice given to you by your health care provider. Make sure you discuss any questions you have with your health care provider. Document Revised: 05/23/2019 Document Reviewed: 10/01/2016 [employer] Patient Education  2020 [employer]  

## 2020-05-08 NOTE — Progress Notes (Signed)
° °  Subjective:    Patient ID: Todd Zhang, male    DOB: 24-Nov-1945, 74 y.o.   MRN: [id number]   Chief Complaint: Foot exam (Needs documentation for diabetic shoes)   HPI Todd Zhang presents today for a diabetic foot exam to be evaluated for diabetic shoes. Reports open sore on left big toe that has been present for the last 6 months or so that is not improving. He reports that it hurts constantly and rates pain at about 7/10.     Review of Systems  Constitutional: Negative.   HENT: Negative.   Eyes: Negative.   Respiratory: Positive for shortness of breath (for years).   Cardiovascular: Positive for leg swelling (bilateral feet).  Gastrointestinal: Negative.   Genitourinary: Negative.   Musculoskeletal: Negative.   Skin: Positive for wound (ulcer on left big toe).  Neurological: Positive for numbness (bilateral feet).  Psychiatric/Behavioral: Negative.        Objective:   Physical Exam Vitals reviewed.  HENT:     Head: Normocephalic.     Nose: Congestion present.     Mouth/Throat:     Mouth: Mucous membranes are moist.  Cardiovascular:     Rate and Rhythm: Normal rate and regular rhythm.     Pulses:          Dorsalis pedis pulses are 1+ on the right side and 1+ on the left side.       Posterior tibial pulses are 1+ on the right side and 1+ on the left side.     Heart sounds: Normal heart sounds.  Pulmonary:     Effort: Pulmonary effort is normal.     Breath sounds: Normal breath sounds.  Musculoskeletal:        General: No signs of injury.     Cervical back: Normal range of motion.  Feet:     Right foot:     Protective Sensation: 10 sites tested. 0 sites sensed.     Toenail Condition: Right toenails are abnormally thick.     Left foot:     Protective Sensation: 10 sites tested. 0 sites sensed.     Skin integrity: Ulcer (big toe) present.     Toenail Condition: Left toenails are abnormally thick.     Comments: Amputation of middle toes bilaterally Skin:     General: Skin is warm and dry.     Capillary Refill: Capillary refill takes 2 to 3 seconds.  Neurological:     Mental Status: He is alert and oriented to person, place, and time.  Psychiatric:        Mood and Affect: Mood normal.        Behavior: Behavior normal.   BP 107/68    Pulse 78    Temp 97.8 F (36.6 C) (Temporal)    Resp 20    Ht 6' (1.829 m)    Wt 224 lb (101.6 kg)    SpO2 100%    BMI 30.38 kg/m       Assessment & Plan:  Todd Zhang comes in today with chief complaint of Foot exam (Needs documentation for diabetic shoes)   Diagnosis and orders addressed:  1. Type 2 diabetes mellitus with diabetic neuropathy, with long-term current use of insulin (HCC) - PR DIABETIC DELUXE SHOE Keep follow up appointment with DR. Drake   Follow up plan: Prn    [name] Daphine Deutscher, FNP

## 2020-05-16 ENCOUNTER — Other Ambulatory Visit: Payer: Self-pay | Admitting: Nurse Practitioner

## 2020-05-16 DIAGNOSIS — Z794 Long term (current) use of insulin: Secondary | ICD-10-CM

## 2020-05-16 DIAGNOSIS — E1140 Type 2 diabetes mellitus with diabetic neuropathy, unspecified: Secondary | ICD-10-CM

## 2020-05-16 DIAGNOSIS — K219 Gastro-esophageal reflux disease without esophagitis: Secondary | ICD-10-CM

## 2020-06-02 ENCOUNTER — Telehealth: Payer: Medicare Other

## 2020-06-03 ENCOUNTER — Other Ambulatory Visit: Payer: Self-pay | Admitting: Nurse Practitioner

## 2020-07-01 ENCOUNTER — Ambulatory Visit: Payer: Medicare Other | Admitting: Urology

## 2020-07-01 NOTE — Progress Notes (Incomplete)
H&P  Chief Complaint: BPH w/ LUTS  History of Present Illness:   10.19.2021: Pt here for F/U of his BPH symptomatology treated on 6.22.2021.   (below copied from [facility] records):  6.22.2021: Here today referred by his PCP to discuss management of his BPH. He was last seen per our office prior to 2007 but he cannot quite recall why. At present, he c/o weak urinary stream and increased urinary freq (particularly in the night). He was started on tamsulosin a few months ago but has had little improvement on this. He reports having quite a bit of swelling on his ankles and that he can feel this fluid draining off at night.   His PSA has been stable and very low.   Past Medical History:  Diagnosis Date  . Atrial fibrillation (HCC)   . Cervical spinal stenosis   . DDD (degenerative disc disease), cervical   . DDD (degenerative disc disease), lumbar   . Diabetes mellitus without complication (HCC)   . Hyperlipidemia   . Hypertension   . Peripheral artery disease [facility])     Past Surgical History:  Procedure Laterality Date  . AMPUTATION TOE    . KNEE SURGERY Left   . NOSE SURGERY      Home Medications:  Allergies as of 07/01/2020      Reactions   Asa [aspirin] Hives   Penicillins Hives      Medication List       Accurate as of July 01, 2020  2:33 PM. If you have any questions, ask your nurse or doctor.        Accu-Chek Aviva Plus test strip Generic drug: glucose blood USE TO check blood glucose 4 times daily   alendronate 70 MG tablet Commonly known as: FOSAMAX TAKE 1 TABLET BY MOUTH ONCE A WEEK. TAKE WITH A FULL GLASS OF WATER ON AN EMPTY STOMACH   cetirizine 10 MG tablet Commonly known as: ZYRTEC Take 10 mg by mouth daily.   fenofibrate 145 MG tablet Commonly known as: TRICOR Take 1 tablet (145 mg total) by mouth daily.   gabapentin 300 MG capsule Commonly known as: NEURONTIN TAKE 1 CAPSULE BY MOUTH THREE TIMES DAILY FOR PAIN   insulin regular 100 units/mL  injection Commonly known as: HumuLIN R INJECT 10 UNITS INTO THE SKIN THREE TIMES DAILY BEFORE MEALS   Insulin Syringe-Needle U-100 31G X 5/16" 0.3 ML Misc Commonly known as: Global Inject Ease Insulin Syr Use with insulin Dx E11.9   metFORMIN 500 MG tablet Commonly known as: GLUCOPHAGE TAKE 1 TABLET BY MOUTH TWICE DAILY   metoprolol succinate 50 MG 24 hr tablet Commonly known as: TOPROL-XL TAKE 1 TABLET BY MOUTH DAILY, TAKE WITH FOOD OR IMMEDIATELY FOLLOWING A MEAL   multivitamin tablet Take 1 tablet by mouth daily.   mupirocin ointment 2 % Commonly known as: BACTROBAN APPLY TO LEFT GREAT TOE   omeprazole 20 MG capsule Commonly known as: PRILOSEC TAKE 1 CAPSULE BY MOUTH EVERY DAY   pravastatin 40 MG tablet Commonly known as: PRAVACHOL Take 1 tablet (40 mg total) by mouth daily.   tamsulosin 0.4 MG Caps capsule Commonly known as: FLOMAX Take 1 capsule (0.4 mg total) by mouth daily.   triamcinolone cream 0.5 % Commonly known as: KENALOG APPLY TO THE AFFECTED AREA(S) THREE TIMES DAILY   vitamin B-12 500 MCG tablet Commonly known as: CYANOCOBALAMIN Take 500 mcg by mouth daily.   Xarelto 20 MG Tabs tablet Generic drug: rivaroxaban Take 1 tablet (20  mg total) by mouth daily.       Allergies:  Allergies  Allergen Reactions  . Asa [Aspirin] Hives  . Penicillins Hives    Family History  Problem Relation Age of Onset  . Stroke Mother   . Heart disease Mother        CABG  . Cancer Father     Social History:  reports that he quit smoking about 9 years ago. His smoking use included cigarettes. He started smoking about 54 years ago. He smoked 1.00 pack per day. He has never used smokeless tobacco. He reports previous drug use. He reports that he does not drink alcohol.  ROS: A complete review of systems was performed.  All systems are negative except for pertinent findings as noted.  Physical Exam:  Vital signs in last 24 hours: There were no vitals taken for  this visit. Constitutional:  Alert and oriented, No acute distress Cardiovascular: Regular rate  Respiratory: Normal respiratory effort GI: Abdomen is soft, nontender, nondistended, no abdominal masses. No CVAT.  Genitourinary: Normal male phallus, testes are descended bilaterally and non-tender and without masses, scrotum is normal in appearance without lesions or masses, perineum is normal on inspection. Lymphatic: No lymphadenopathy Neurologic: Grossly intact, no focal deficits Psychiatric: Normal mood and affect  Laboratory Data:  No results for input(s): WBC, HGB, HCT, PLT in the last 72 hours.  No results for input(s): NA, K, CL, GLUCOSE, BUN, CALCIUM, CREATININE in the last 72 hours.  Invalid input(s): CO3   No results found for this or any previous visit (from the past 24 hour(s)). No results found for this or any previous visit (from the past 240 hour(s)).  Renal Function: No results for input(s): CREATININE in the last 168 hours. CrCl cannot be calculated (Patient's most recent lab result is older than the maximum 21 days allowed.).  Radiologic Imaging: No results found.  Impression/Assessment:  ***  Plan:  ***

## 2020-07-07 ENCOUNTER — Other Ambulatory Visit: Payer: Self-pay | Admitting: Nurse Practitioner

## 2020-07-07 DIAGNOSIS — N401 Enlarged prostate with lower urinary tract symptoms: Secondary | ICD-10-CM

## 2020-07-07 DIAGNOSIS — R3914 Feeling of incomplete bladder emptying: Secondary | ICD-10-CM

## 2020-07-09 ENCOUNTER — Telehealth: Payer: Medicare Other

## 2020-07-16 ENCOUNTER — Ambulatory Visit (INDEPENDENT_AMBULATORY_CARE_PROVIDER_SITE_OTHER): Payer: Medicare Other | Admitting: Nurse Practitioner

## 2020-07-16 ENCOUNTER — Encounter: Payer: Self-pay | Admitting: Nurse Practitioner

## 2020-07-16 ENCOUNTER — Other Ambulatory Visit: Payer: Self-pay

## 2020-07-16 VITALS — BP 119/64 | HR 101 | Temp 96.8°F | Resp 20 | Ht 72.0 in | Wt 225.0 lb

## 2020-07-16 DIAGNOSIS — I10 Essential (primary) hypertension: Secondary | ICD-10-CM

## 2020-07-16 DIAGNOSIS — M25562 Pain in left knee: Secondary | ICD-10-CM

## 2020-07-16 DIAGNOSIS — I739 Peripheral vascular disease, unspecified: Secondary | ICD-10-CM

## 2020-07-16 DIAGNOSIS — E1140 Type 2 diabetes mellitus with diabetic neuropathy, unspecified: Secondary | ICD-10-CM | POA: Diagnosis not present

## 2020-07-16 DIAGNOSIS — I482 Chronic atrial fibrillation, unspecified: Secondary | ICD-10-CM

## 2020-07-16 DIAGNOSIS — E7800 Pure hypercholesterolemia, unspecified: Secondary | ICD-10-CM | POA: Diagnosis not present

## 2020-07-16 DIAGNOSIS — K219 Gastro-esophageal reflux disease without esophagitis: Secondary | ICD-10-CM

## 2020-07-16 DIAGNOSIS — J441 Chronic obstructive pulmonary disease with (acute) exacerbation: Secondary | ICD-10-CM

## 2020-07-16 DIAGNOSIS — M5030 Other cervical disc degeneration, unspecified cervical region: Secondary | ICD-10-CM

## 2020-07-16 DIAGNOSIS — R3914 Feeling of incomplete bladder emptying: Secondary | ICD-10-CM

## 2020-07-16 DIAGNOSIS — N401 Enlarged prostate with lower urinary tract symptoms: Secondary | ICD-10-CM

## 2020-07-16 DIAGNOSIS — I480 Paroxysmal atrial fibrillation: Secondary | ICD-10-CM | POA: Diagnosis not present

## 2020-07-16 DIAGNOSIS — F411 Generalized anxiety disorder: Secondary | ICD-10-CM

## 2020-07-16 DIAGNOSIS — Z794 Long term (current) use of insulin: Secondary | ICD-10-CM

## 2020-07-16 LAB — BAYER DCA HB A1C WAIVED: HB A1C (BAYER DCA - WAIVED): 6.9 % (ref <7.0)

## 2020-07-16 MED ORDER — TAMSULOSIN HCL 0.4 MG PO CAPS: 0.4000 mg | ORAL_CAPSULE | Freq: Every day | ORAL | 1 refills | Status: DC

## 2020-07-16 MED ORDER — OMEPRAZOLE 20 MG PO CPDR: DELAYED_RELEASE_CAPSULE | ORAL | 1 refills | Status: DC

## 2020-07-16 MED ORDER — METOPROLOL SUCCINATE ER 50 MG PO TB24: ORAL_TABLET | ORAL | 1 refills | Status: DC

## 2020-07-16 MED ORDER — XARELTO 20 MG PO TABS: 20.0000 mg | ORAL_TABLET | Freq: Every day | ORAL | 5 refills | Status: DC

## 2020-07-16 MED ORDER — METFORMIN HCL 500 MG PO TABS: 500.0000 mg | ORAL_TABLET | Freq: Two times a day (BID) | ORAL | 2 refills | Status: DC

## 2020-07-16 MED ORDER — PREDNISONE 20 MG PO TABS: ORAL_TABLET | ORAL | 0 refills | Status: DC

## 2020-07-16 NOTE — Progress Notes (Signed)
Subjective:    Patient ID: Todd Zhang, male    DOB: 09-07-1946, 74 y.o.   MRN: [id number]   Chief Complaint: Medical Management of Chronic Issues    HPI:  1. Type 2 diabetes mellitus with diabetic neuropathy, with long-term current use of insulin (Todd Zhang) Checks blood sugars at home. Usually in the 100s. Limits carb intake. Has an ulcer on his left big toe that he sees a wound doctor for. Says that looks a lot better now. Denies blurry vision. Does have numbness and tingling in his feet. Lab Results  Component Value Date   HGBA1C 6.7 04/10/2020    2. Essential hypertension Does not check BP at home. Has been having some chest pain lately that happens with activity or with rest. Pain lasts for about a minute or so. Is not aware of any other associated symptoms when the pain occurs and is not sure how often it happens. Has SOB from COPD. BP Readings from Last 3 Encounters:  07/16/20 119/64  05/08/20 107/68  04/10/20 111/71    3. Pure hypercholesterolemia Limits fried and fatty food intake. Lab Results  Component Value Date   CHOL 205 (H) 04/10/2020   HDL 45 04/10/2020   LDLCALC 125 (H) 04/10/2020   TRIG 197 (H) 04/10/2020   CHOLHDL 4.6 04/10/2020    4. Paroxysmal atrial fibrillation (HCC) Is on xarelto. Denies palpitations or racing heart.  5. Peripheral artery disease (HCC) No issues at this time  6. Gastroesophageal reflux disease without esophagitis Is having heartburn. Prilosec is not helping much.  9. COPD exacerbation ([place]) Denies any worsening of chronic cough and SOB.  10. DDD (degenerative disc disease), cervical Has chronic back pain. Gabapentin is helping.  10. Generalized anxiety disorder Is doing well with anxiety but has been feeling frustrated because he can't sleep well due to pain in his leg. GAD 7 : Generalized Anxiety Score 04/10/2020 01/10/2020 06/13/2019  Nervous, Anxious, on Edge _0 Control/stop worrying _1 Worry too much - different  things _2 Trouble relaxing 0 0 1  Restless 0 0 1  Easily annoyed or irritable 0 0 0  Afraid - awful might happen 0 0 0  Total GAD 7 Score _3 Anxiety Difficulty Not difficult at all Somewhat difficult Somewhat difficult      Outpatient Encounter Medications as of 07/16/2020  Medication Sig   alendronate (FOSAMAX) 70 MG tablet TAKE 1 TABLET BY MOUTH ONCE A WEEK. TAKE WITH A FULL GLASS OF WATER ON AN EMPTY STOMACH   cetirizine (ZYRTEC) 10 MG tablet Take 10 mg by mouth daily.    fenofibrate (TRICOR) 145 MG tablet Take 1 tablet (145 mg total) by mouth daily.   gabapentin (NEURONTIN) 300 MG capsule TAKE 1 CAPSULE BY MOUTH THREE TIMES DAILY FOR PAIN   glucose blood (ACCU-CHEK AVIVA PLUS) test strip USE TO check blood glucose 4 times daily   HYDROcodone-acetaminophen (NORCO/VICODIN) 5-325 MG tablet Take 1 tablet by mouth every 4 (four) hours as needed.   insulin regular (HUMULIN R) 100 units/mL injection INJECT 10 UNITS INTO THE SKIN THREE TIMES DAILY BEFORE MEALS   Insulin Syringe-Needle U-100 (GLOBAL INJECT EASE INSULIN SYR) 31G X 5/16" 0.3 ML MISC Use with insulin Dx E11.9   metFORMIN (GLUCOPHAGE) 500 MG tablet TAKE 1 TABLET BY MOUTH TWICE DAILY   metoprolol succinate (TOPROL-XL) 50 MG 24 hr tablet TAKE 1 TABLET BY MOUTH DAILY, TAKE WITH FOOD OR  FOLLOWING A MEAL  °• Multiple Vitamin (MULTIVITAMIN) tablet Take 1 tablet by mouth daily.  °• mupirocin ointment (BACTROBAN) 2 % APPLY TO LEFT GREAT TOE  °• omeprazole (PRILOSEC) 20 MG capsule TAKE 1 CAPSULE BY MOUTH EVERY DAY  °• pravastatin (PRAVACHOL) 40 MG tablet Take 1 tablet (40 mg total) by mouth daily.  °• tamsulosin (FLOMAX) 0.4 MG CAPS capsule TAKE ONE CAPSULE BY MOUTH DAILY  °• triamcinolone cream (KENALOG) 0.5 % APPLY TO THE AFFECTED AREA(S) THREE TIMES DAILY  °• vitamin B-12 (CYANOCOBALAMIN) 500 MCG tablet Take 500 mcg by mouth daily.  °• XARELTO 20 MG TABS tablet Take 1 tablet (20 mg total) by mouth daily.  ° °No  facility-administered encounter medications on file as of 07/16/2020.  ° ° °Past Surgical History:  °Procedure Laterality Date  °• AMPUTATION TOE    °• KNEE SURGERY Left   °• NOSE SURGERY    ° ° °Family History  °Problem Relation Age of Onset  °• Stroke Mother   °• Heart disease Mother   °     CABG  °• Cancer Father   ° ° °New complaints: °Fell on 10/21 and went to [facility] ED. Was cleaning out truck and fell off truck into pile of debris. A nail punctured the left buttock, he had a contusion to his ribs, and swelling of his left knee. He is still having pain in his left knee and hip that is constant. Pain is described as aching that he rates at over 10/10. Says from left knee down to foot is warm and swollen. Elevates leg and wraps it with a brace to help the pain. Any movement aggravates the pain.  ° °Social history: °Living with [address] in [place] with a friend where he has been for the last 8-9 months but has to move by the 15th of this month because his friend is going out of town. ° °Controlled substance contract: N/A ° ° °Review of Systems  °Constitutional: Negative.   °HENT: Negative.   °Eyes: Negative.   °Respiratory: Positive for shortness of breath.   °Cardiovascular: Positive for chest pain.  °Gastrointestinal: Negative.   °Genitourinary: Negative.   °Musculoskeletal: Positive for arthralgias (left knee and hip), back pain and joint swelling (left knee).  °Skin: Positive for wound (left toe and left buttock).  °Neurological: Negative.   °Psychiatric/Behavioral: Negative.   ° °   °Objective:  ° Physical Exam °Vitals and nursing note reviewed.  °Constitutional:   °   Appearance: Normal appearance.  °HENT:  °   Head: Normocephalic.  °   Right Ear: Tympanic membrane normal.  °   Left Ear: Tympanic membrane normal.  °   Nose: Nose normal.  °   Mouth/Throat:  °   Mouth: Mucous membranes are moist.  °   Pharynx: Oropharynx is clear.  °Cardiovascular:  °   Rate and Rhythm: Rhythm irregular.  °   Pulses: Normal  pulses.  °   Heart sounds: Normal heart sounds.  °Pulmonary:  °   Effort: Pulmonary effort is normal.  °   Breath sounds: Normal breath sounds.  °Abdominal:  °   General: Bowel sounds are normal.  °   Palpations: Abdomen is soft.  °Musculoskeletal:     °   General: Tenderness and signs of injury present. Swelling: left knee.  °   Cervical back: Normal range of motion.  °   Left knee: Swelling present.  °Skin: °   General: Skin is warm and   dry.  °   Findings: No lesion (no lesion noted on buttocks from injury).  °Neurological:  °   General: No focal deficit present.  °   Mental Status: He is alert and oriented to person, place, and time.  °Psychiatric:     °   Mood and Affect: Mood normal.     °   Behavior: Behavior normal.  ° °Hgba1c discussed at appointment 6.9% ° °BP 119/64    Pulse (!) 101    Temp (!) 96.8 °F (36 °C) (Temporal)    Resp 20    Ht 6' (1.829 m)    Wt 225 lb (102.1 kg)    SpO2 100%    BMI 30.52 kg/m²  ° °   °Assessment & Plan:  °Todd Zhang comes in today with chief complaint of Medical Management of Chronic Issues ° ° °Diagnosis and orders addressed: ° °1. Type 2 diabetes mellitus with diabetic neuropathy, with long-term current use of insulin (HCC) °Watch carbs in diet °Blood sugars may go up while on steroids °- Bayer DCA Hb A1c Waived °- metFORMIN (GLUCOPHAGE) 500 MG tablet; Take 1 tablet (500 mg total) by mouth 2 (two) times daily.  Dispense: 60 tablet; Refill: 2 ° °2. Essential hypertension °Low sodium diet °- CBC with Differential/Platelet °- CMP14+EGFR °- metoprolol succinate (TOPROL-XL) 50 MG 24 hr tablet; TAKE 1 TABLET BY MOUTH DAILY, TAKE WITH FOOD OR IMMEDIATELY FOLLOWING A MEAL  Dispense: 90 tablet; Refill: 1 ° °3. Pure hypercholesterolemia °Low fat diet °- Lipid panel ° °4. Paroxysmal atrial fibrillation (HCC) °Continue blood thinner ° °5. Peripheral artery disease (HCC) ° °7. Gastroesophageal reflux disease without esophagitis °Avoid spicy foods °Do not eat 2 hours prior to bedtime °-  omeprazole (PRILOSEC) 20 MG capsule; TAKE 1 CAPSULE BY MOUTH EVERY DAY  Dispense: 90 capsule; Refill: 1 ° °8. COPD exacerbation (HCC) °Avoid cigarette smoke ° °9. DDD (degenerative disc disease), cervical °Moist heat °Rest °No bending or stooping ° °10. Generalized anxiety disorder °Stress management ° °11. Acute pain of left knee °Ace wrap will help °If steroids do not help we will do ortho referral °- predniSONE (DELTASONE) 20 MG tablet; 2 po at sametime daily for 5 days  Dispense: 10 tablet; Refill: 0 ° °12. Benign prostatic hyperplasia with incomplete bladder emptying °- tamsulosin (FLOMAX) 0.4 MG CAPS capsule; Take 1 capsule (0.4 mg total) by mouth daily.  Dispense: 90 capsule; Refill: 1 ° °13. Chronic atrial fibrillation (HCC) °- XARELTO 20 MG TABS tablet; Take 1 tablet (20 mg total) by mouth daily.  Dispense: 30 tablet; Refill: 5 ° ° °Labs pending °Health Maintenance reviewed °Diet and exercise encouraged ° °Follow up plan: °3 months ° ° °[name] Martin, FNP ° °

## 2020-07-17 LAB — LIPID PANEL
Chol/HDL Ratio: 4.8 ratio (ref 0.0–5.0)
Cholesterol, Total: 198 mg/dL (ref 100–199)
HDL: 41 mg/dL (ref >39)
LDL Chol Calc (NIH): 110 mg/dL — ABNORMAL HIGH (ref 0–99)
Triglycerides: 275 mg/dL — ABNORMAL HIGH (ref 0–149)
VLDL Cholesterol Cal: 47 mg/dL — ABNORMAL HIGH (ref 5–40)

## 2020-07-17 LAB — CBC WITH DIFFERENTIAL/PLATELET
Basophils Absolute: 0 10*3/uL (ref 0.0–0.2)
Basos: 0 %
EOS (ABSOLUTE): 0.1 10*3/uL (ref 0.0–0.4)
Eos: 1 %
Hematocrit: 36.7 % — ABNORMAL LOW (ref 37.5–51.0)
Hemoglobin: 12.5 g/dL — ABNORMAL LOW (ref 13.0–17.7)
Immature Grans (Abs): 0 10*3/uL (ref 0.0–0.1)
Immature Granulocytes: 0 %
Lymphocytes Absolute: 2.2 10*3/uL (ref 0.7–3.1)
Lymphs: 31 %
MCH: 31.2 pg (ref 26.6–33.0)
MCHC: 34.1 g/dL (ref 31.5–35.7)
MCV: 92 fL (ref 79–97)
Monocytes Absolute: 0.5 10*3/uL (ref 0.1–0.9)
Monocytes: 7 %
Neutrophils Absolute: 4.4 10*3/uL (ref 1.4–7.0)
Neutrophils: 61 %
Platelets: 211 10*3/uL (ref 150–450)
RBC: 4.01 x10E6/uL — ABNORMAL LOW (ref 4.14–5.80)
RDW: 13.6 % (ref 11.6–15.4)
WBC: 7.2 10*3/uL (ref 3.4–10.8)

## 2020-07-17 LAB — CMP14+EGFR
ALT: 14 IU/L (ref 0–44)
AST: 18 IU/L (ref 0–40)
Albumin/Globulin Ratio: 1.9 (ref 1.2–2.2)
Albumin: 4.3 g/dL (ref 3.7–4.7)
Alkaline Phosphatase: 49 IU/L (ref 44–121)
BUN/Creatinine Ratio: 21 (ref 10–24)
BUN: 38 mg/dL — ABNORMAL HIGH (ref 8–27)
Bilirubin Total: 0.3 mg/dL (ref 0.0–1.2)
CO2: 24 mmol/L (ref 20–29)
Calcium: 9.4 mg/dL (ref 8.6–10.2)
Chloride: 101 mmol/L (ref 96–106)
Creatinine, Ser: 1.81 mg/dL — ABNORMAL HIGH (ref 0.76–1.27)
GFR calc Af Amer: 42 mL/min/{1.73_m2} — ABNORMAL LOW (ref >59)
GFR calc non Af Amer: 36 mL/min/{1.73_m2} — ABNORMAL LOW (ref >59)
Globulin, Total: 2.3 g/dL (ref 1.5–4.5)
Glucose: 124 mg/dL — ABNORMAL HIGH (ref 65–99)
Potassium: 4.8 mmol/L (ref 3.5–5.2)
Sodium: 138 mmol/L (ref 134–144)
Total Protein: 6.6 g/dL (ref 6.0–8.5)

## 2020-08-03 ENCOUNTER — Other Ambulatory Visit: Payer: Self-pay | Admitting: Nurse Practitioner

## 2020-08-11 ENCOUNTER — Ambulatory Visit: Payer: Medicare Other | Admitting: Licensed Clinical Social Worker

## 2020-08-11 DIAGNOSIS — I480 Paroxysmal atrial fibrillation: Secondary | ICD-10-CM

## 2020-08-11 DIAGNOSIS — J441 Chronic obstructive pulmonary disease with (acute) exacerbation: Secondary | ICD-10-CM

## 2020-08-11 DIAGNOSIS — I10 Essential (primary) hypertension: Secondary | ICD-10-CM

## 2020-08-11 DIAGNOSIS — F411 Generalized anxiety disorder: Secondary | ICD-10-CM

## 2020-08-11 DIAGNOSIS — M5030 Other cervical disc degeneration, unspecified cervical region: Secondary | ICD-10-CM

## 2020-08-11 DIAGNOSIS — E1140 Type 2 diabetes mellitus with diabetic neuropathy, unspecified: Secondary | ICD-10-CM

## 2020-08-11 DIAGNOSIS — K219 Gastro-esophageal reflux disease without esophagitis: Secondary | ICD-10-CM

## 2020-08-11 NOTE — Patient Instructions (Addendum)
Licensed Clinical Social Worker Visit Information  Goals we discussed today:   .  "I need stable housing" (pt-stated)          Current Barriers:   Lacks caregiver support.   Financial Constraints in patient with Chronic Diagnoses of Type 2 DM, HTN, Atrial Fibrillation, GERD, COPD, DDD and GAD  Literacy barriers  transportation  Nurse Case Manager Clinical Goal(s):  Over the next 30 days, patient will work with CCM team to address needs related to homelessness  Interventions:  .LCSW has worked with patient regarding housing assistance.  LCSW talked with client about current food supply of client  Talkedwith client about sleeping challenges of client  Talkedwithclient about pain issues of client  Talked withclientabout  social work needs of client  Talked with client about upcoming client appointments  Talked with client about ambulation challenges  Client and LCSW talked about condition of his toes (he said he has some sores on his feet)  Talked with client about housing applications of client (LCSW talked with client about Janelle Floor related to housing needs of client)  Talked with client about transport needs of client  Patient Self Care Activities:   Performs ADL's independently  Performs IADL's independently  See previous goal updates         Follow Up Plan: LCSW to call client in next 4 weeks to assess the housing needs of client at that time  Materials Provided: No  The patient verbalized understanding of instructions provided today and declined a print copy of patient instruction materials.   Kelton Pillar.Lorraine Cimmino MSW, LCSW Licensed Clinical Social Worker [facility] Care Management [phone]

## 2020-08-11 NOTE — Chronic Care Management (AMB) (Signed)
Chronic Care Management    Clinical Social Work Follow Up Note  08/11/2020 Name: Todd Zhang MRN: [id number] DOB: 04/27/1946  Todd Zhang is a 74 y.o. year old male who is a primary care patient of [name], FNP. The CCM team was consulted for assistance with [name] .   Review of patient status, including review of consultants reports, other relevant assessments, and collaboration with appropriate care team members and the patient's provider was performed as part of comprehensive patient evaluation and provision of chronic care management services.    SDOH (Social Determinants of Health) assessments performed: No; risk for tobacco use; risk for depression; risk for housing needs ; risk for stress; risk for financial strain    Chronic Care Management from 06/13/2019 in [facility]  PHQ-9 Total Score 6       GAD 7 : Generalized Anxiety Score 04/10/2020 01/10/2020 06/13/2019  Nervous, Anxious, on Edge 1 1 1   Control/stop worrying 1 1 1   Worry too much - different things 1 1 1   Trouble relaxing 0 0 1  Restless 0 0 1  Easily annoyed or irritable 0 0 0  Afraid - awful might happen 0 0 0  Total GAD 7 Score 3 3 5   Anxiety Difficulty Not difficult at all Somewhat difficult Somewhat difficult    Outpatient Encounter Medications as of 08/11/2020  Medication Sig  . alendronate (FOSAMAX) 70 MG tablet TAKE 1 TABLET BY MOUTH ONCE A WEEK. TAKE WITH A FULL GLASS OF WATER ON AN EMPTY STOMACH  . cetirizine (ZYRTEC) 10 MG tablet Take 10 mg by mouth daily.   . fenofibrate (TRICOR) 145 MG tablet Take 1 tablet (145 mg total) by mouth daily.  gabapentin (NEURONTIN) 300 MG capsule TAKE 1 CAPSULE BY MOUTH THREE TIMES DAILY FOR PAIN  . glucose blood (ACCU-CHEK AVIVA PLUS) test strip USE TO check blood glucose 4 times daily  . HYDROcodone-acetaminophen (NORCO/VICODIN) 5-325 MG tablet Take 1 tablet by mouth every 4 (four) hours as needed.  . insulin  regular (HUMULIN R) 100 units/mL injection INJECT 10 UNITS INTO THE SKIN THREE TIMES DAILY BEFORE MEALS  . Insulin Syringe-Needle U-100 (GLOBAL INJECT EASE INSULIN SYR) 31G X 5/16" 0.3 ML MISC Use with insulin Dx E11.9  . metFORMIN (GLUCOPHAGE) 500 MG tablet Take 1 tablet (500 mg total) by mouth 2 (two) times daily.  . metoprolol succinate (TOPROL-XL) 50 MG 24 hr tablet TAKE 1 TABLET BY MOUTH DAILY, TAKE WITH FOOD OR IMMEDIATELY FOLLOWING A MEAL  . Multiple Vitamin (MULTIVITAMIN) tablet Take 1 tablet by mouth daily.  . mupirocin ointment (BACTROBAN) 2 % APPLY TO LEFT GREAT TOE  . omeprazole (PRILOSEC) 20 MG capsule TAKE 1 CAPSULE BY MOUTH EVERY DAY  . pravastatin (PRAVACHOL) 40 MG tablet Take 1 tablet (40 mg total) by mouth daily.  . predniSONE (DELTASONE) 20 MG tablet 2 po at sametime daily for 5 days  . tamsulosin (FLOMAX) 0.4 MG CAPS capsule Take 1 capsule (0.4 mg total) by mouth daily.  triamcinolone cream (KENALOG) 0.5 % APPLY TO THE AFFECTED AREA(S) THREE TIMES DAILY  . vitamin B-12 (CYANOCOBALAMIN) 500 MCG tablet Take 500 mcg by mouth daily.  08/13/2020 20 MG TABS tablet Take 1 tablet (20 mg total) by mouth daily.   No facility-administered encounter medications on file as of 08/11/2020.    Goals    .  "I need stable housing" (pt-stated)        Current Barriers:  Lacks caregiver support.   Financial Constraints in patient with Chronic Diagnoses of Type 2 DM, HTN, Atrial Fibrillation, GERD, COPD, DDD and GAD  Literacy barriers  transportation  Nurse Case Manager Clinical Goal(s):   Over the next 30 days, patient will work with CCM team to address needs related to homelessness  Interventions:   .LCSW has worked with patient regarding housing assistance.  LCSW talked with client about current food supply of client  Talkedwith client about sleeping challenges of client  Talkedwithclient about pain issues of client  Talked withclientabout  social work needs of  client  Talked with client about upcoming client appointments  Talked with client about ambulation challenges  Client and LCSW talked about condition of his toes (he said he has some sores on his feet)  Talked with client about housing applications of client (LCSW talked with client about Janelle Floor related to housing needs of client)  Talked with client about transport needs of client  Patient Self Care Activities:   Performs ADL's independently  Performs IADL's independently  See previous goal updates          Follow Up Plan:  LCSW to call client in next 4 weeks to assess the housing needs of client at that time  Kelton Pillar.Esteen Delpriore MSW, LCSW Licensed Clinical Social Worker [facility] Care Management [phone]

## 2020-08-12 ENCOUNTER — Other Ambulatory Visit: Payer: Self-pay | Admitting: *Deleted

## 2020-08-12 DIAGNOSIS — J441 Chronic obstructive pulmonary disease with (acute) exacerbation: Secondary | ICD-10-CM

## 2020-08-12 DIAGNOSIS — Z599 Problem related to housing and economic circumstances, unspecified: Secondary | ICD-10-CM

## 2020-08-12 DIAGNOSIS — I10 Essential (primary) hypertension: Secondary | ICD-10-CM

## 2020-08-12 DIAGNOSIS — Z59819 Housing instability, housed unspecified: Secondary | ICD-10-CM

## 2020-08-12 DIAGNOSIS — E1140 Type 2 diabetes mellitus with diabetic neuropathy, unspecified: Secondary | ICD-10-CM

## 2020-08-12 DIAGNOSIS — Z794 Long term (current) use of insulin: Secondary | ICD-10-CM

## 2020-08-14 ENCOUNTER — Telehealth: Payer: Self-pay

## 2020-08-14 NOTE — Telephone Encounter (Signed)
    MA12/10/2019   Name: Todd Zhang   MRN: [id number]   DOB: 07-15-1946   AGE: 74 y.o.   GENDER: male   PCP [name], FNP.   08/14/20 Spoke with patient to let him know to expect a letter in the mail about senior low income housing. I will call the Todd Zhang within the next 7 days to ensure he has received the resource letter.    Orbin Mayeux, AAS Paralegal, [facility] Care Guide . Embedded Care Coordination [facility] Health  Care Management  [address] [place], [place] [address] [email or web address]  [phone]   [email or web address]

## 2020-08-14 NOTE — Telephone Encounter (Signed)
    MA12/10/2019 1st Attempt  Name: Todd Zhang   MRN: [id number]   DOB: June 01, 1946   AGE: 74 y.o.   GENDER: male   PCP [name], FNP.   08/14/20 Unable to leave message voicemail not set-up. I will attempt to call patient again in the next 7 days. Emailed Surveyor[employer] to [employer] to mail to patient.    Aero Drummonds, AAS Paralegal, [facility] Care Guide . Embedded Care Coordination [facility] Health  Care Management  [address] [place], [place] [address] [email or web address]  [phone]   [email or web address]

## 2020-08-20 ENCOUNTER — Telehealth: Payer: Self-pay

## 2020-08-20 NOTE — Telephone Encounter (Signed)
    MA12/04/2020   Name: Todd Zhang   MRN: [id number]   DOB: 1946-05-14   AGE: 74 y.o.   GENDER: male   PCP [name], FNP.   08/20/20 Unable to leave message voicemail not set-up.  I will call the patient again in the next 7 days to ensure he has received mailed housing resources.    Shaden Lacher, AAS Paralegal, [facility] Care Guide . Embedded Care Coordination [facility] Health  Care Management  [address] [place], [place] [address] [email or web address]  [phone]   [email or web address]

## 2020-08-21 ENCOUNTER — Telehealth: Payer: Self-pay

## 2020-08-21 NOTE — Telephone Encounter (Signed)
    MA12/05/2020   Name: Todd Zhang   MRN: [id number]   DOB: 27-Aug-1946   AGE: 74 y.o.   GENDER: male   PCP [name], FNP.   08/21/20 Spoke with patient he has not received resource letter yet.  I will call him again in the next 5 days.    [name], AAS Paralegal, [facility] Care Guide . Embedded Care Coordination [facility] Health  Care Management  [address] [place], [place] [address] [name]  [phone]   [email or web address]

## 2020-08-26 ENCOUNTER — Telehealth: Payer: Self-pay

## 2020-08-26 NOTE — Telephone Encounter (Signed)
    MA12/14/2021  Name: Todd Zhang   MRN: [id number]   DOB: Nov 01, 1945   AGE: 74 y.o.   GENDER: male   PCP [name], FNP.   Referral Reason: Housing  Interventions: Follow up call placed to the patient to discuss status of referral. Patient has received letter mailed to him containing housing resources for [facility].  He has called some of them and has an appointment to see a property in [place].  Follow up plan: No further follow up planned at this time. The patient has been provided with needed resources.  Closing referral.     Elven Laboy, AAS Paralegal, [facility] Care Guide . Embedded Care Coordination [facility] Health  Care Management  [address] [place], [place] [address] [email or web address]  [phone]   [email or web address]

## 2020-09-13 DIAGNOSIS — Z955 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: Secondary | ICD-10-CM | POA: Insufficient documentation

## 2020-09-17 ENCOUNTER — Ambulatory Visit: Payer: Medicare Other | Admitting: Licensed Clinical Social Worker

## 2020-09-17 DIAGNOSIS — E1140 Type 2 diabetes mellitus with diabetic neuropathy, unspecified: Secondary | ICD-10-CM

## 2020-09-17 DIAGNOSIS — I482 Chronic atrial fibrillation, unspecified: Secondary | ICD-10-CM

## 2020-09-17 DIAGNOSIS — J441 Chronic obstructive pulmonary disease with (acute) exacerbation: Secondary | ICD-10-CM

## 2020-09-17 DIAGNOSIS — F411 Generalized anxiety disorder: Secondary | ICD-10-CM

## 2020-09-17 DIAGNOSIS — M5030 Other cervical disc degeneration, unspecified cervical region: Secondary | ICD-10-CM

## 2020-09-17 DIAGNOSIS — I10 Essential (primary) hypertension: Secondary | ICD-10-CM

## 2020-09-17 DIAGNOSIS — K219 Gastro-esophageal reflux disease without esophagitis: Secondary | ICD-10-CM

## 2020-09-17 NOTE — Chronic Care Management (AMB) (Signed)
Chronic Care Management    Clinical Social Work Follow Up Note  09/17/2020 Name: Todd Zhang MRN: [id number] DOB: August 11, 1946  Todd Zhang is a 75 y.o. year old male who is a primary care patient of [name], FNP. The CCM team was consulted for assistance with [name] .   Review of patient status, including review of consultants reports, other relevant assessments, and collaboration with appropriate care team members and the patient's provider was performed as part of comprehensive patient evaluation and provision of chronic care management services.    SDOH (Social Determinants of Health) assessments performed: No;risk for depression; risk for housing needs; risk for tobacco use; risk for stress; risk for financial strain  Flowsheet Row Chronic Care Management from 06/13/2019 in [facility]  PHQ-9 Total Score 6      GAD 7 : Generalized Anxiety Score 04/10/2020 01/10/2020 06/13/2019  Nervous, Anxious, on Edge 1 1 1   Control/stop worrying 1 1 1   Worry too much - different things 1 1 1   Trouble relaxing 0 0 1  Restless 0 0 1  Easily annoyed or irritable 0 0 0  Afraid - awful might happen 0 0 0  Total GAD 7 Score 3 3 5   Anxiety Difficulty Not difficult at all Somewhat difficult Somewhat difficult    Outpatient Encounter Medications as of 09/17/2020  Medication Sig  . alendronate (FOSAMAX) 70 MG tablet TAKE 1 TABLET BY MOUTH ONCE A WEEK. TAKE WITH A FULL GLASS OF WATER ON AN EMPTY STOMACH  . cetirizine (ZYRTEC) 10 MG tablet Take 10 mg by mouth daily.   . fenofibrate (TRICOR) 145 MG tablet Take 1 tablet (145 mg total) by mouth daily.  gabapentin (NEURONTIN) 300 MG capsule TAKE 1 CAPSULE BY MOUTH THREE TIMES DAILY FOR PAIN  . glucose blood (ACCU-CHEK AVIVA PLUS) test strip USE TO check blood glucose 4 times daily  . HYDROcodone-acetaminophen (NORCO/VICODIN) 5-325 MG tablet Take 1 tablet by mouth every 4 (four) hours as needed.  . insulin  regular (HUMULIN R) 100 units/mL injection INJECT 10 UNITS INTO THE SKIN THREE TIMES DAILY BEFORE MEALS  . Insulin Syringe-Needle U-100 (GLOBAL INJECT EASE INSULIN SYR) 31G X 5/16" 0.3 ML MISC Use with insulin Dx E11.9  . metFORMIN (GLUCOPHAGE) 500 MG tablet Take 1 tablet (500 mg total) by mouth 2 (two) times daily.  . metoprolol succinate (TOPROL-XL) 50 MG 24 hr tablet TAKE 1 TABLET BY MOUTH DAILY, TAKE WITH FOOD OR IMMEDIATELY FOLLOWING A MEAL  . Multiple Vitamin (MULTIVITAMIN) tablet Take 1 tablet by mouth daily.  . mupirocin ointment (BACTROBAN) 2 % APPLY TO LEFT GREAT TOE  . omeprazole (PRILOSEC) 20 MG capsule TAKE 1 CAPSULE BY MOUTH EVERY DAY  . pravastatin (PRAVACHOL) 40 MG tablet Take 1 tablet (40 mg total) by mouth daily.  . predniSONE (DELTASONE) 20 MG tablet 2 po at sametime daily for 5 days  . tamsulosin (FLOMAX) 0.4 MG CAPS capsule Take 1 capsule (0.4 mg total) by mouth daily.  triamcinolone cream (KENALOG) 0.5 % APPLY TO THE AFFECTED AREA(S) THREE TIMES DAILY  . vitamin B-12 (CYANOCOBALAMIN) 500 MCG tablet Take 500 mcg by mouth daily.  11/15/2020 20 MG TABS tablet Take 1 tablet (20 mg total) by mouth daily.   No facility-administered encounter medications on file as of 09/17/2020.    Goals    .  "I need stable housing" (pt-stated)      Current Barriers:  . Lacks caregiver support.  6/16  Financial Constraints in patient with Chronic Diagnoses of Type 2 DM, HTN, Atrial Fibrillation, GERD, COPD, DDD and GAD . Literacy barriers . transportation  Nurse Case Manager Clinical Goal(s):  [name] Over the next 30 days, patient will work with CCM team to address needs related to homelessness  Interventions:    LCSW has worked with patient regarding housing assistance.  LCSW talked with client about client use of Diabetic Shoes  LCSW talked with client about pain in client's feet  LCSW talked with client  about skin care issues on client's foot  LCSW talked with client about transport  needs of client  LCSW talked with client about current food supply of client  Talkedwith client about sleeping challenges of client  Talkedwithclient about pain issues of client  Talked withclientabout  social work needs of client  Talked with client about upcoming client appointments  Talked with client about ambulation challenges (risk for falls)  Talked with client about his skin care for toe (he said he is changing dressing daily on his toe)   Talked with client about family support (he said he has some support from his sisters)  Talked with client about his next appointment in February 2022 with [name] FNP at [facility]  Talked with client about housing list client received from [facility]  Talked with client about vision of client  Talked with client about his housing application at [employer].  Collaborated with RNCM about nursing needs of client  Patient Self Care Activities:  . Performs ADL's independently . Performs IADL's independently  See previous goal updates     Follow Up Plan: LCSW to call client in next 4 weeks to assess the housing needs of client at that time  Kelton Pillar.Sedonia Kitner MSW, LCSW Licensed Clinical Social Worker [facility] Care Management [phone]

## 2020-09-17 NOTE — Patient Instructions (Addendum)
Licensed Clinical Social Worker Visit Information  Goals we discussed today:    "I need stable housing" (pt-stated)          Current Barriers:   Lacks caregiver support.   Financial Constraints in patient with Chronic Diagnoses of Type 2 DM, HTN, Atrial Fibrillation, GERD, COPD, DDD and GAD  Literacy barriers  transportation  Nurse Case Manager Clinical Goal(s):   Over the next 30 days, patient will work with CCM team to address needs related to homelessness  Interventions:    LCSW has worked with patient regarding housing assistance.  LCSW talked with client about client use of Diabetic Shoes  LCSW talked with client about pain in client's feet  LCSW talked with client  about skin care issues on client's foot  LCSW talked with client about transport needs of client  LCSW talked with client about current food supply of client  Talkedwith client about sleeping challenges of client  Talkedwithclient about pain issues of client  Talked withclientaboutsocial workneeds of client  Talked with client about upcoming client appointments  Talked with client about ambulation challenges (risk for falls)  Talked with client about his skin care for toe (he said he is changing dressing daily on his toe)  Talked with client about family support (he said he has some support from his sisters)  Talked with client about his next appointment in February 2022 with [name] FNP at [facility]  Talked with client about housing list client received from [facility]  Talked with client about vision of client  Talked with client about his housing application at [employer].  Collaborated with RNCM about nursing needs of client  Patient Self Care Activities:   Performs ADL's independently  Performs IADL's independently  See previous goal updates     Follow Up Plan: LCSW to call client in next 4 weeks to assess the housing needs of client at  that time  Materials Provided: No  The patient verbalized understanding of instructions provided today and declined a print copy of patient instruction materials.   Kelton Pillar.Sharie Amorin MSW, LCSW Licensed Clinical Social Worker [facility] Care Management [phone]

## 2020-09-23 ENCOUNTER — Telehealth: Payer: Self-pay | Admitting: *Deleted

## 2020-09-23 ENCOUNTER — Ambulatory Visit: Payer: Medicare Other | Admitting: *Deleted

## 2020-09-23 DIAGNOSIS — Z794 Long term (current) use of insulin: Secondary | ICD-10-CM

## 2020-09-23 DIAGNOSIS — I739 Peripheral vascular disease, unspecified: Secondary | ICD-10-CM

## 2020-09-23 DIAGNOSIS — I10 Essential (primary) hypertension: Secondary | ICD-10-CM

## 2020-09-23 DIAGNOSIS — E1140 Type 2 diabetes mellitus with diabetic neuropathy, unspecified: Secondary | ICD-10-CM

## 2020-09-23 NOTE — Telephone Encounter (Signed)
09/23/2020  I spoke with Mr Banka today regarding CCM and he discussed his neuropathy and chronic pain. He would like to increase gabapentin to help control pain. I advised that he has an appt on 10/17/20 with PCP and that he can discuss it with her then, but he said that was too long to wait.   Forwarding to [facility] Clinical Staff.  [name], BSN, RN-BC Embedded Chronic Care Manager [facility] / [facility] Care Management Direct Dial: [phone]

## 2020-09-23 NOTE — Patient Instructions (Signed)
Visit Information  Patient Care Plan: RNCM: Diabetes, Type 2    Problem Identified: Glycemic Management (Diabetes, Type 2)   Priority: Medium    Long-Range Goal: Glycemic Management Optimized   This Visit's Progress: On track  Priority: Medium  Note:   Current Barriers:  . Chronic Disease Management support and education needs related to diabetes . Lacks social connections . Housing concerns  Nurse Case Manager Clinical Goal(s):  [name] Over the next 30 days, patient will verbalize understanding of plan for diabetes management . Over the next 30 days, patient will work with [name] to address needs related to self-management of diabetes  Interventions:  . 1:1 collaboration with [name], FNP regarding development and update of comprehensive plan of care as evidenced by provider attestation and co-signature . Inter-disciplinary care team collaboration (see longitudinal plan of care) . Evaluation of current treatment plan related to diabetes and patient's adherence to plan as established by provider. . Chart reviewed including relevant office notes and lab results . Discussed recent A1C . Discussed home blood sugar testing o 3 times a day and PRN o Normally runs "good" . Discussed diet . Reviewed upcoming appointment with PCP . Encouraged patient to continue checking and recording blood sugar 3 times a day and bring log to PCP appt . Call PCP with any readings outside of recommended range . Provided with RNCM contact number and encouraged to reach out as needed  Patient Goals/Self-Care Activities Over the next 30 days, patient will: . check blood sugar at prescribed times . check blood sugar if I feel it is too high or too low . enter blood sugar readings and medication or insulin into daily log . take the blood sugar log to all doctor visits . take the blood sugar meter to all doctor visits  . Take medication as prescribed . Plan meals . Keep follow-up  appointments with PCP . Call PCP with any readings outside of recommended range  Follow Up Plan:  Telephone follow up appointment with care management team member scheduled for: RN Care Manager 10/08/20 The patient has been provided with contact information for the care management team and has been advised to call with any health related questions or concerns.  Next PCP appointment scheduled for: 10/17/20 with [name], FNP Schedule appointment with [name]      Problem Identified: Non-healing Wound Left Foot   Priority: High    Long-Range Goal: Diabetic Foot Care/Wound Care   This Visit's Progress: Not on track  Priority: High  Note:   Current Barriers:  . Care Coordination needs related to foot wound in a patient with diabetes and hypertension . Lacks social connections . Housing concerns . Has not followed-up with podiatrist . Not following recommendations for proper wound care  Nurse Case Manager Clinical Goal(s):  [name] Over the next 30 days, patient will verbalize understanding of plan for wound care . Over the next 30 days, patient will work with [name] to address needs related to diabetic wound of foot . Over the next 14 days, patient will schedule follow-up appointment with podiatrist  Interventions:  . 1:1 collaboration with [name], FNP regarding development and update of comprehensive plan of care as evidenced by provider attestation and co-signature . Inter-disciplinary care team collaboration (see longitudinal plan of care) . Chart reviewed including relevant office notes and lab results . 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 Strongly advised patient to d/c using alcohol and peroxide to clean the wound because it is damaging to the healing tissue and will not promote new tissue growth. Patient disagreed stating that it was the only thing that worked and kept it "dry" . Provided basic wound care education o Encouraged patient to clean with saline or mild soap and water and to apply his prescription "ointment" or Vaseline and cover with gauze.  o Change gauze daily and if wet or soiled.  . Strongly advised to schedule follow-up with podiatrist, [name] . Encouraged patient to keep appt with PCP . Reach out to PCP or [name] with any new or worsening symptoms . Signs of infection discussed . Discussed use of diabetic shoes o He has received them and is wearing them regularly but the right one is lose o States that they did measure both feet prior to ordering the shoes . RN Care Manager will reach out to [name] to discuss lose fitting diabetic shoe . Provided with RN Care Manager contact number and encouraged to reach out as needed  Patient Goals/Self-Care Activities Over the next 30 days, patient will: . check feet daily for cuts, sores or redness . keep feet up while sitting . wash and dry feet carefully every day . wear comfortable, cotton socks . wear comfortable, well-fitting shoes  . Schedule follow-up appointment with podiatrist, [name] . Change dressing daily and as needed if soiled or wet . DO NOT USE alcohol or peroxide to clean the wound . Use saline or water and a gentle soap to clean the wound daily  . Do not soak in water . Apply vaseline or the ointment provided by your doctor to the wound daily and cover with gauze . Take note of any signs of infections: redness, swelling, drainage, odor, increased pain . Call PCP or [name] with any new or worsening symptoms   Follow Up Plan:  Telephone follow up appointment with care management team member scheduled for: RN  Care Manager 10/08/20 The patient has been provided with contact information for the care management team and has been advised to call with any health related questions or concerns.  Next PCP appointment scheduled for: 10/17/20 with [name], FNP Schedule appointment with [name]        Patient Care Plan: RNCM: Hypertension (Adult)    Problem Identified: Hypertension     Long-Range Goal: Hypertension Monitored   This Visit's Progress: Not on track  Priority: Medium  Note:   Current Barriers:  . Chronic Disease Management support and education needs related to hypertension . Lacks social connections . Does not check blood pressure regularly  Nurse Case Manager Clinical Goal(s):  [name] Over the next 30 days, patient will verbalize understanding of plan for hypertension management . Over the next 30 days, patient will work with [name] to address needs related to self-management of hypertension  Interventions:  . 1:1 collaboration with [name], FNP regarding development and update of comprehensive plan of care as evidenced by provider attestation and co-signature . Inter-disciplinary care team collaboration (see longitudinal plan of care) . Evaluation of current treatment plan related to hypertension and patient's adherence to plan as established  by provider. . Chart reviewed including relevant office notes and lab results . Discussed recent in-office blood pressure readings . Reviewed and discussed medications . Discussed plans with patient for ongoing care management follow up and provided patient with direct contact information for care management team . Advised patient, providing education and rationale, to monitor blood pressure 3 times a week and record, calling [phone] for findings outside established parameters.  . Reviewed upcoming appointment with PCP . Provided with RN Care Manager contact number and encouraged to reach out as  needed  Patient Goals/Self-Care Activities Over the next 30 days, patient will: . check blood pressure 3 times per wee . choose a place to take my blood pressure (home, clinic or office, retail store) . write blood pressure results in a log or diary  . Bring blood pressure log to PCP appointment . Call PCP with any readings outside of recommended range . Take medication as directed . Watch salt/sodium in diet  Follow Up Plan:  Telephone follow up appointment with care management team member scheduled for: RN Care Manager 10/08/20 The patient has been provided with contact information for the care management team and has been advised to call with any health related questions or concerns.  Next PCP appointment scheduled for: 10/17/20 with [name], FNP         The patient verbalized understanding of instructions, educational materials, and care plan provided today and declined offer to receive copy of patient instructions, educational materials, and care plan.   Follow-up: Telephone follow up appointment with care management team member scheduled for: RN Care Manager 10/08/20 The patient has been provided with contact information for the care management team and has been advised to call with any health related questions or concerns.  Next PCP appointment scheduled for: 10/17/20 with [name], FNP Schedule appointment with Dr Lenis Noon, BSN, RN-BC Embedded Chronic Care Manager [facility] Care Management Direct Dial: [phone]

## 2020-09-23 NOTE — Chronic Care Management (AMB) (Signed)
Chronic Care Management   CCM RN Visit Note  09/23/2020 Name: Todd Zhang MRN: [id number] DOB: 07-14-1946  Subjective: Todd Zhang is a 75 y.o. year old male who is a primary care patient of Bennie Zhang, [name], FNP. The CCM team was consulted for assistance with chronic disease management and care coordination needs.    Engaged with patient by telephone for follow up visit in response to provider referral for pharmacy case management and/or care coordination services.   Consent to Services:  The patient was given information about Chronic Care Management services, agreed to services, and gave verbal consent prior to initiation of services.  Please see initial visit note for detailed documentation.   Patient agreed to services and verbal consent obtained.   Assessment/Interventions: Review of patient past medical history, allergies, medications, health status, including review of consultants reports, laboratory and other test data, was performed as part of comprehensive evaluation and provision of chronic care management services.   SDOH (Social Determinants of Health) assessments and interventions performed:    CCM Care Plan  Allergies  Allergen Reactions  . Asa [Aspirin] Hives  . Penicillins Hives    Outpatient Encounter Medications as of 09/23/2020  Medication Sig  . alendronate (FOSAMAX) 70 MG tablet TAKE 1 TABLET BY MOUTH ONCE A WEEK. TAKE WITH A FULL GLASS OF WATER ON AN EMPTY STOMACH  . cetirizine (ZYRTEC) 10 MG tablet Take 10 mg by mouth daily.   . fenofibrate (TRICOR) 145 MG tablet Take 1 tablet (145 mg total) by mouth daily.  [name]. gabapentin (NEURONTIN) 300 MG capsule TAKE 1 CAPSULE BY MOUTH THREE TIMES DAILY FOR PAIN  . glucose blood (ACCU-CHEK AVIVA PLUS) test strip USE TO check blood glucose 4 times daily  . HYDROcodone-acetaminophen (NORCO/VICODIN) 5-325 MG tablet Take 1 tablet by mouth every 4 (four) hours as needed.  . insulin regular (HUMULIN R) 100 units/mL  injection INJECT 10 UNITS INTO THE SKIN THREE TIMES DAILY BEFORE MEALS  . Insulin Syringe-Needle U-100 (GLOBAL INJECT EASE INSULIN SYR) 31G X 5/16" 0.3 ML MISC Use with insulin Dx E11.9  . metFORMIN (GLUCOPHAGE) 500 MG tablet Take 1 tablet (500 mg total) by mouth 2 (two) times daily.  . metoprolol succinate (TOPROL-XL) 50 MG 24 hr tablet TAKE 1 TABLET BY MOUTH DAILY, TAKE WITH FOOD OR IMMEDIATELY FOLLOWING A MEAL  . Multiple Vitamin (MULTIVITAMIN) tablet Take 1 tablet by mouth daily.  . mupirocin ointment (BACTROBAN) 2 % APPLY TO LEFT GREAT TOE  . omeprazole (PRILOSEC) 20 MG capsule TAKE 1 CAPSULE BY MOUTH EVERY DAY  . pravastatin (PRAVACHOL) 40 MG tablet Take 1 tablet (40 mg total) by mouth daily.  . predniSONE (DELTASONE) 20 MG tablet 2 po at sametime daily for 5 days  . tamsulosin (FLOMAX) 0.4 MG CAPS capsule Take 1 capsule (0.4 mg total) by mouth daily.  [name]. triamcinolone cream (KENALOG) 0.5 % APPLY TO THE AFFECTED AREA(S) THREE TIMES DAILY  . vitamin B-12 (CYANOCOBALAMIN) 500 MCG tablet Take 500 mcg by mouth daily.  Todd Zhang. XARELTO 20 MG TABS tablet Take 1 tablet (20 mg total) by mouth daily.   No facility-administered encounter medications on file as of 09/23/2020.    Patient Active Problem List   Diagnosis Date Noted  . Pure hypercholesterolemia 07/16/2020  . Peripheral artery disease (HCC)   . COPD exacerbation (HCC) 05/14/2019  . DDD (degenerative disc disease), cervical 11/06/2018  . Cervical spinal stenosis 11/06/2018  . Generalized anxiety disorder 11/29/2017  . Benign prostatic hyperplasia with incomplete  bladder emptying 11/28/2017  . Gastroesophageal reflux disease without esophagitis 09/07/2017  . Essential hypertension 09/01/2017  . Onychomycosis 09/01/2017  . Atrial fibrillation (HCC) 09/01/2017  . Type 2 diabetes mellitus with diabetic neuropathy, with long-term current use of insulin (HCC) 06/19/2014  . Back pain 06/19/2014  . Neck pain 06/19/2014    Conditions to be  addressed/monitored:DM, PAD, HTN, COPD, GAD, atrial fibrillation  Patient Care Plan: RNCM: Diabetes, Type 2    Problem Identified: Glycemic Management (Diabetes, Type 2)   Priority: Medium    Long-Range Goal: Glycemic Management Optimized   This Visit's Progress: On track  Priority: Medium  Note:   Current Barriers:  . Chronic Disease Management support and education needs related to diabetes . Lacks social connections . Housing concerns  Nurse Case Manager Clinical Goal(s):  [name] Over the next 30 days, patient will verbalize understanding of plan for diabetes management . Over the next 30 days, patient will work with [name] to address needs related to self-management of diabetes  Interventions:  . 1:1 collaboration with [name], FNP regarding development and update of comprehensive plan of care as evidenced by provider attestation and co-signature . Inter-disciplinary care team collaboration (see longitudinal plan of care) . Evaluation of current treatment plan related to diabetes and patient's adherence to plan as established by provider. . Chart reviewed including relevant office notes and lab results . Discussed recent A1C . Discussed home blood sugar testing o 3 times a day and PRN o Normally runs "good" . Discussed diet . Reviewed upcoming appointment with PCP . Encouraged patient to continue checking and recording blood sugar 3 times a day and bring log to PCP appt . Call PCP with any readings outside of recommended range . Provided with RNCM contact number and encouraged to reach out as needed  Patient Goals/Self-Care Activities Over the next 30 days, patient will: . check blood sugar at prescribed times . check blood sugar if I feel it is too high or too low . enter blood sugar readings and medication or insulin into daily log . take the blood sugar log to all doctor visits . take the blood sugar meter to all doctor visits  . Take medication as  prescribed . Plan meals . Keep follow-up appointments with PCP . Call PCP with any readings outside of recommended range     Problem Identified: Non-healing Wound Left Foot   Priority: High    Long-Range Goal: Diabetic Foot Care/Wound Care   This Visit's Progress: Not on track  Priority: High  Note:   Current Barriers:  . Care Coordination needs related to foot wound in a patient with diabetes and hypertension . Lacks social connections . Housing concerns . Has not followed-up with podiatrist . Not following recommendations for proper wound care  Nurse Case Manager Clinical Goal(s):  [name] Over the next 30 days, patient will verbalize understanding of plan for wound care . Over the next 30 days, patient will work with [name] to address needs related to diabetic wound of foot . Over the next 14 days, patient will schedule follow-up appointment with podiatrist  Interventions:  . 1:1 collaboration with [name], FNP regarding development and update of comprehensive plan of care as evidenced by provider attestation and co-signature . Inter-disciplinary care team collaboration (see longitudinal plan of care) . Chart reviewed including relevant office notes and lab results . 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 Strongly advised patient to d/c using alcohol and peroxide to clean the wound because it is damaging to the healing tissue and will not promote new tissue growth. Patient disagreed stating that it was the only thing that worked and kept it "dry" . Provided basic wound care education o Encouraged patient to clean with saline or mild soap and  water and to apply his prescription "ointment" or Vaseline and cover with gauze.  o Change gauze daily and if wet or soiled.  . Strongly advised to schedule follow-up with podiatrist, [name] . Encouraged patient to keep appt with PCP . Reach out to PCP or [name] with any new or worsening symptoms . Signs of infection discussed . Discussed use of diabetic shoes o He has received them and is wearing them regularly but the right one is lose o States that they did measure both feet prior to ordering the shoes . RN Care Manager will reach out to [name] to discuss lose fitting diabetic shoe . Provided with RN Care Manager contact number and encouraged to reach out as needed  Patient Goals/Self-Care Activities Over the next 30 days, patient will: . check feet daily for cuts, sores or redness . keep feet up while sitting . wash and dry feet carefully every day . wear comfortable, cotton socks . wear comfortable, well-fitting shoes  . Schedule follow-up appointment with podiatrist, [name] . Change dressing daily and as needed if soiled or wet . DO NOT USE alcohol or peroxide to clean the wound . Use saline or water and a gentle soap to clean the wound daily  . Do not soak in water . Apply vaseline or the ointment provided by your doctor to the wound daily and cover with gauze . Take note of any signs of infections: redness, swelling, drainage, odor, increased pain . Call PCP or [name] with any new or worsening symptoms    Patient Care Plan: RNCM: Hypertension (Adult)    Problem Identified: Hypertension     Long-Range Goal: Hypertension Monitored   This Visit's Progress: Not on track  Priority: Medium  Note:   Current Barriers:  . Chronic Disease Management support and education needs related to hypertension . Lacks social connections . Does not check blood pressure regularly  Nurse Case Manager Clinical Goal(s):  [name] Over the next 30 days, patient will verbalize  understanding of plan for hypertension management . Over the next 30 days, patient will work with [name] to address needs related to self-management of hypertension  Interventions:  . 1:1 collaboration with [name], FNP regarding development and update of comprehensive plan of care as evidenced by provider attestation and co-signature . Inter-disciplinary care team collaboration (see longitudinal plan of care) . Evaluation of current treatment plan related to hypertension and patient's adherence to plan as established by provider. . Chart reviewed including relevant office notes and lab results . Discussed recent in-office blood pressure readings . Reviewed and discussed medications . Discussed plans with patient for ongoing care management follow up and provided patient with direct contact information for care management team . Advised patient, providing education and rationale, to monitor blood pressure 3 times a week and record, calling [phone] for findings  outside established parameters.  . Reviewed upcoming appointment with PCP . Provided with RN Care Manager contact number and encouraged to reach out as needed  Patient Goals/Self-Care Activities Over the next 30 days, patient will: . check blood pressure 3 times per wee . choose a place to take my blood pressure (home, clinic or office, retail store) . write blood pressure results in a log or diary  . Bring blood pressure log to PCP appointment . Call PCP with any readings outside of recommended range . Take medication as directed . Watch salt/sodium in diet      Plan: Telephone follow up appointment with care management team member scheduled for: RN Care Manager 10/08/20 The patient has been provided with contact information for the care management team and has been advised to call with any health related questions or concerns.  Next PCP appointment scheduled for: 10/17/20 with [name],  FNP Schedule appointment with Dr Lenis Noon, BSN, RN-BC Embedded Chronic Care Manager [facility] Care Management Direct Dial: [phone]

## 2020-09-23 NOTE — Telephone Encounter (Signed)
Will discuss at upcoming appointment

## 2020-09-23 NOTE — Telephone Encounter (Signed)
Will have to discuss at next appointment

## 2020-10-04 ENCOUNTER — Other Ambulatory Visit: Payer: Self-pay | Admitting: Nurse Practitioner

## 2020-10-08 ENCOUNTER — Telehealth: Payer: Medicare Other | Admitting: *Deleted

## 2020-10-09 ENCOUNTER — Other Ambulatory Visit: Payer: Self-pay | Admitting: Nurse Practitioner

## 2020-10-09 DIAGNOSIS — Z794 Long term (current) use of insulin: Secondary | ICD-10-CM

## 2020-10-09 DIAGNOSIS — E1140 Type 2 diabetes mellitus with diabetic neuropathy, unspecified: Secondary | ICD-10-CM

## 2020-10-14 ENCOUNTER — Ambulatory Visit (INDEPENDENT_AMBULATORY_CARE_PROVIDER_SITE_OTHER): Payer: Medicare Other | Admitting: *Deleted

## 2020-10-14 DIAGNOSIS — I10 Essential (primary) hypertension: Secondary | ICD-10-CM

## 2020-10-14 DIAGNOSIS — E1140 Type 2 diabetes mellitus with diabetic neuropathy, unspecified: Secondary | ICD-10-CM

## 2020-10-14 DIAGNOSIS — I739 Peripheral vascular disease, unspecified: Secondary | ICD-10-CM

## 2020-10-17 ENCOUNTER — Encounter: Payer: Self-pay | Admitting: Nurse Practitioner

## 2020-10-17 ENCOUNTER — Ambulatory Visit (INDEPENDENT_AMBULATORY_CARE_PROVIDER_SITE_OTHER): Payer: Medicare Other | Admitting: Nurse Practitioner

## 2020-10-17 ENCOUNTER — Other Ambulatory Visit: Payer: Self-pay

## 2020-10-17 VITALS — BP 124/65 | HR 72 | Temp 98.1°F | Resp 20 | Ht 72.0 in | Wt 231.0 lb

## 2020-10-17 DIAGNOSIS — G8929 Other chronic pain: Secondary | ICD-10-CM

## 2020-10-17 DIAGNOSIS — E1140 Type 2 diabetes mellitus with diabetic neuropathy, unspecified: Secondary | ICD-10-CM | POA: Diagnosis not present

## 2020-10-17 DIAGNOSIS — M5030 Other cervical disc degeneration, unspecified cervical region: Secondary | ICD-10-CM

## 2020-10-17 DIAGNOSIS — I10 Essential (primary) hypertension: Secondary | ICD-10-CM

## 2020-10-17 DIAGNOSIS — N401 Enlarged prostate with lower urinary tract symptoms: Secondary | ICD-10-CM

## 2020-10-17 DIAGNOSIS — I739 Peripheral vascular disease, unspecified: Secondary | ICD-10-CM

## 2020-10-17 DIAGNOSIS — I480 Paroxysmal atrial fibrillation: Secondary | ICD-10-CM

## 2020-10-17 DIAGNOSIS — R3914 Feeling of incomplete bladder emptying: Secondary | ICD-10-CM

## 2020-10-17 DIAGNOSIS — K219 Gastro-esophageal reflux disease without esophagitis: Secondary | ICD-10-CM

## 2020-10-17 DIAGNOSIS — S91109A Unspecified open wound of unspecified toe(s) without damage to nail, initial encounter: Secondary | ICD-10-CM

## 2020-10-17 DIAGNOSIS — E7800 Pure hypercholesterolemia, unspecified: Secondary | ICD-10-CM

## 2020-10-17 DIAGNOSIS — J441 Chronic obstructive pulmonary disease with (acute) exacerbation: Secondary | ICD-10-CM

## 2020-10-17 DIAGNOSIS — S91102A Unspecified open wound of left great toe without damage to nail, initial encounter: Secondary | ICD-10-CM

## 2020-10-17 DIAGNOSIS — Z794 Long term (current) use of insulin: Secondary | ICD-10-CM

## 2020-10-17 DIAGNOSIS — M545 Low back pain, unspecified: Secondary | ICD-10-CM

## 2020-10-17 LAB — BAYER DCA HB A1C WAIVED: HB A1C (BAYER DCA - WAIVED): 7.1 % — ABNORMAL HIGH (ref <7.0)

## 2020-10-17 MED ORDER — SULFAMETHOXAZOLE-TRIMETHOPRIM 800-160 MG PO TABS: 1.0000 | ORAL_TABLET | Freq: Two times a day (BID) | ORAL | 0 refills | Status: DC

## 2020-10-17 MED ORDER — GABAPENTIN 300 MG PO CAPS: 300.0000 mg | ORAL_CAPSULE | Freq: Four times a day (QID) | ORAL | 5 refills | Status: DC

## 2020-10-17 MED ORDER — METFORMIN HCL 500 MG PO TABS
500.0000 mg | ORAL_TABLET | Freq: Two times a day (BID) | ORAL | 2 refills | Status: DC
Start: 2020-10-17 — End: 2020-12-03

## 2020-10-17 MED ORDER — PRAVASTATIN SODIUM 40 MG PO TABS
40.0000 mg | ORAL_TABLET | Freq: Every day | ORAL | 1 refills | Status: DC
Start: 2020-10-17 — End: 2021-01-28

## 2020-10-17 MED ORDER — FENOFIBRATE 145 MG PO TABS
145.0000 mg | ORAL_TABLET | Freq: Every day | ORAL | 1 refills | Status: DC
Start: 2020-10-17 — End: 2021-01-28

## 2020-10-17 NOTE — Patient Instructions (Signed)
Diabetes Mellitus and Foot Care Foot care is an important part of your health, especially when you have diabetes. Diabetes may cause you to have problems because of poor blood flow (circulation) to your feet and legs, which can cause your skin to:  Become thinner and drier.  Break more easily.  Heal more slowly.  Peel and crack. You may also have nerve damage (neuropathy) in your legs and feet, causing decreased feeling in them. This means that you may not notice minor injuries to your feet that could lead to more serious problems. Noticing and addressing any potential problems early is the best way to prevent future foot problems. How to care for your feet Foot hygiene  Wash your feet daily with warm water and mild soap. Do not use hot water. Then, pat your feet and the areas between your toes until they are completely dry. Do not soak your feet as this can dry your skin.  Trim your toenails straight across. Do not dig under them or around the cuticle. File the edges of your nails with an emery board or nail file.  Apply a moisturizing lotion or petroleum jelly to the skin on your feet and to dry, brittle toenails. Use lotion that does not contain alcohol and is unscented. Do not apply lotion between your toes.   Shoes and socks  Wear clean socks or stockings every day. Make sure they are not too tight. Do not wear knee-high stockings since they may decrease blood flow to your legs.  Wear shoes that fit properly and have enough cushioning. Always look in your shoes before you put them on to be sure there are no objects inside.  To break in new shoes, wear them for just a few hours a day. This prevents injuries on your feet. Wounds, scrapes, corns, and calluses  Check your feet daily for blisters, cuts, bruises, sores, and redness. If you cannot see the bottom of your feet, use a mirror or ask someone for help.  Do not cut corns or calluses or try to remove them with medicine.  If you  find a minor scrape, cut, or break in the skin on your feet, keep it and the skin around it clean and dry. You may clean these areas with mild soap and water. Do not clean the area with peroxide, alcohol, or iodine.  If you have a wound, scrape, corn, or callus on your foot, look at it several times a day to make sure it is healing and not infected. Check for: ? Redness, swelling, or pain. ? Fluid or blood. ? Warmth. ? Pus or a bad smell.   General tips  Do not cross your legs. This may decrease blood flow to your feet.  Do not use heating pads or hot water bottles on your feet. They may burn your skin. If you have lost feeling in your feet or legs, you may not know this is happening until it is too late.  Protect your feet from hot and cold by wearing shoes, such as at the beach or on hot pavement.  Schedule a complete foot exam at least once a year (annually) or more often if you have foot problems. Report any cuts, sores, or bruises to your health care provider immediately. Where to find more information  American Diabetes Association: www.diabetes.org  Association of Diabetes Care & Education Specialists: www.diabeteseducator.org Contact a health care provider if:  You have a medical condition that increases your risk of infection and   you have any cuts, sores, or bruises on your feet.  You have an injury that is not healing.  You have redness on your legs or feet.  You feel burning or tingling in your legs or feet.  You have pain or cramps in your legs and feet.  Your legs or feet are numb.  Your feet always feel cold.  You have pain around any toenails. Get help right away if:  You have a wound, scrape, corn, or callus on your foot and: ? You have pain, swelling, or redness that gets worse. ? You have fluid or blood coming from the wound, scrape, corn, or callus. ? Your wound, scrape, corn, or callus feels warm to the touch. ? You have pus or a bad smell coming from  the wound, scrape, corn, or callus. ? You have a fever. ? You have a red line going up your leg. Summary  Check your feet every day for blisters, cuts, bruises, sores, and redness.  Apply a moisturizing lotion or petroleum jelly to the skin on your feet and to dry, brittle toenails.  Wear shoes that fit properly and have enough cushioning.  If you have foot problems, report any cuts, sores, or bruises to your health care provider immediately.  Schedule a complete foot exam at least once a year (annually) or more often if you have foot problems. This information is not intended to replace advice given to you by your health care provider. Make sure you discuss any questions you have with your health care provider. Document Revised: 03/20/2020 Document Reviewed: 03/20/2020 Elsevier Patient Education  2021 Elsevier Inc.  

## 2020-10-17 NOTE — Progress Notes (Signed)
Subjective:    Patient ID: Todd Zhang, male    DOB: 10/18/1945, 75 y.o.   MRN: [id number]   Chief Complaint: Follow up for management of chronic diseases.   HPI:  1. Primary  hypertension Takes medications as prescribed. Does not monitor Bps at home, does not have a cuff. Checks periodically at the drug store and states his "BP is normal."  2. Paroxysmal atrial fibrillation (HCC) Does not notice any changes in heart rate. Takes Xarelto daily.   3. Peripheral artery disease (HCC) Continues to have leg/calf pain when ambulating, continues when resting. Bilateral arm and hand pain daily. Gets pale when they begin to hurt  4. Type 2 diabetes mellitus with diabetic neuropathy, with long-term current use of insulin (HCC)  Takes metformin daily. Checks FSBG "2 or 3 times per day." He takes Novolin insulin "when I need it." He states when blood glucose is "over 200 I know I need a shot." Been eating fruit and 2% milk.  HBGA1C today 7.1%  Lab Results  Component Value Date   HGBA1C 6.9 07/16/2020     5. COPD exacerbation (HCC) Periodically SOB, does not take any medication for COPD management. Believes he may need another CPAP machine to help him sleep. States he snores and it gives him a sore throat. Has had bronchitis "for a while" and takes lemon juice and honey which helps with cough and congestion.   6. DDD (degenerative disc disease), cervical Takes Gabapentin as prescribed. States it is not helping. He has pain in bilateral arms and hands daily.   7. Benign prostatic hyperplasia with incomplete bladder emptying Having issues voiding. Strains to urinate and has urgency throughout the night. Stream is weak without hesitancy. Does not feel his bladder fully empties when voiding. Taking flomax as prescribed but does not feel is is helping.   8. Gastroesophageal reflux disease without esophagitis Doing well on omperazole. Has had no issues recently with reflux  9. Pure  hypercholesterolemia  Trying to stay away from fried or fatty foods. Occasionally has fattier foods, but enjoys fruit and beans. Getting vegetables daily. Continues to eat bacon, fat back, liver. Does not eat chicken or seafood often.   Lab Results  Component Value Date   CHOL 198 07/16/2020   CHOL 205 (H) 04/10/2020   CHOL 168 01/10/2020   Lab Results  Component Value Date   HDL 41 07/16/2020   HDL 45 04/10/2020   HDL 44 01/10/2020   Lab Results  Component Value Date   LDLCALC 110 (H) 07/16/2020   LDLCALC 125 (H) 04/10/2020   LDLCALC 86 01/10/2020   Lab Results  Component Value Date   TRIG 275 (H) 07/16/2020   TRIG 197 (H) 04/10/2020   TRIG 224 (H) 01/10/2020   Lab Results  Component Value Date   CHOLHDL 4.8 07/16/2020   CHOLHDL 4.6 04/10/2020   CHOLHDL 3.8 01/10/2020   No results found for: LDLDIRECT   The 10-year ASCVD risk score Mikey Bussing DC Jr., et al., 2013) is: 47.8%   Values used to calculate the score:     Age: 43 years     Sex: Male     Is Non-Hispanic African American: No     Diabetic: Yes     Tobacco smoker: No     Systolic Blood Pressure: 035 mmHg     Is BP treated: Yes     HDL Cholesterol: 41 mg/dL     Total Cholesterol: 198 mg/dL   Outpatient Encounter  Medications as of 10/17/2020  Medication Sig  . alendronate (FOSAMAX) 70 MG tablet TAKE 1 TABLET BY MOUTH ONCE A WEEK. TAKE WITH A FULL GLASS OF WATER ON AN EMPTY STOMACH  . cetirizine (ZYRTEC) 10 MG tablet Take 10 mg by mouth daily.   . fenofibrate (TRICOR) 145 MG tablet Take 1 tablet (145 mg total) by mouth daily.  [name] gabapentin (NEURONTIN) 300 MG capsule TAKE 1 CAPSULE BY MOUTH THREE TIMES DAILY FOR PAIN  . glucose blood (ACCU-CHEK AVIVA PLUS) test strip USE TO check blood glucose 4 times daily  . HYDROcodone-acetaminophen (NORCO/VICODIN) 5-325 MG tablet Take 1 tablet by mouth every 4 (four) hours as needed.  . insulin regular (HUMULIN R) 100 units/mL injection INJECT 10 UNITS INTO THE SKIN THREE  TIMES DAILY BEFORE MEALS  . Insulin Syringe-Needle U-100 (GLOBAL INJECT EASE INSULIN SYR) 31G X 5/16" 0.3 ML MISC Use with insulin Dx E11.9  . metFORMIN (GLUCOPHAGE) 500 MG tablet Take 1 tablet (500 mg total) by mouth 2 (two) times daily.  . metoprolol succinate (TOPROL-XL) 50 MG 24 hr tablet TAKE 1 TABLET BY MOUTH DAILY, TAKE WITH FOOD OR IMMEDIATELY FOLLOWING A MEAL  . Multiple Vitamin (MULTIVITAMIN) tablet Take 1 tablet by mouth daily.  . mupirocin ointment (BACTROBAN) 2 % APPLY TO LEFT GREAT TOE  . omeprazole (PRILOSEC) 20 MG capsule TAKE 1 CAPSULE BY MOUTH EVERY DAY  . pravastatin (PRAVACHOL) 40 MG tablet Take 1 tablet (40 mg total) by mouth daily.  . predniSONE (DELTASONE) 20 MG tablet 2 po at sametime daily for 5 days  . tamsulosin (FLOMAX) 0.4 MG CAPS capsule Take 1 capsule (0.4 mg total) by mouth daily.  [name] triamcinolone cream (KENALOG) 0.5 % APPLY TO THE AFFECTED AREA(S) THREE TIMES DAILY  . vitamin B-12 (CYANOCOBALAMIN) 500 MCG tablet Take 500 mcg by mouth daily.  Alveda Reasons 20 MG TABS tablet Take 1 tablet (20 mg total) by mouth daily.   No facility-administered encounter medications on file as of 10/17/2020.    Past Surgical History:  Procedure Laterality Date  . AMPUTATION TOE    . KNEE SURGERY Left   . NOSE SURGERY      Family History  Problem Relation Age of Onset  . Stroke Mother   . Heart disease Mother        CABG  . Cancer Father     New complaints: None  Social history: Lives with a friend. Is getting ready to move into government subsidized housing  Controlled substance contract: Will need to sign today    Review of Systems  Constitutional: Positive for fatigue. Negative for fever.  HENT: Negative for congestion, hearing loss, sinus pressure, sinus pain and trouble swallowing.   Respiratory: Positive for cough and shortness of breath. Negative for chest tightness.   Cardiovascular: Negative for chest pain, palpitations and leg swelling.   Gastrointestinal: Negative for abdominal distention, abdominal pain, constipation and diarrhea.  Genitourinary: Positive for frequency and urgency. Negative for difficulty urinating and hematuria.  Musculoskeletal: Positive for arthralgias and back pain.  Neurological: Positive for numbness. Negative for dizziness, light-headedness and headaches.  Psychiatric/Behavioral: Negative for confusion. The patient is not nervous/anxious.        Objective:   Physical Exam Constitutional:      Appearance: Normal appearance. He is obese.  HENT:     Head: Normocephalic.  Eyes:     Extraocular Movements: Extraocular movements intact.     Conjunctiva/sclera: Conjunctivae normal.     Pupils: Pupils are equal,  round, and reactive to light.  Neck:     Vascular: No carotid bruit.  Cardiovascular:     Rate and Rhythm: Normal rate. Rhythm irregular.     Pulses: Normal pulses.          Dorsalis pedis pulses are 2+ on the right side and 2+ on the left side.     Heart sounds: Normal heart sounds.  Pulmonary:     Breath sounds: Normal breath sounds.     Comments: Audibly SOB Abdominal:     General: Bowel sounds are normal.  Musculoskeletal:        General: No swelling.     Cervical back: Normal range of motion and neck supple. No rigidity.     Right lower leg: No edema.     Left lower leg: No edema.       Feet:  Feet:     Right foot:     Protective Sensation: 4 sites tested. 0 sites sensed.     Skin integrity: Skin integrity normal.     Toenail Condition: Right toenails are abnormally thick.     Left foot:     Protective Sensation: 4 sites tested. 0 sites sensed.     Skin integrity: Ulcer, erythema and warmth present.     Toenail Condition: Left toenails are abnormally thick.  Skin:    General: Skin is warm and dry.  Neurological:     Mental Status: He is alert and oriented to person, place, and time.  Psychiatric:        Mood and Affect: Mood normal.        Behavior: Behavior normal.         Thought Content: Thought content normal.        Judgment: Judgment normal.    BP 124/65   Pulse 72   Temp 98.1 F (36.7 C) (Temporal)   Resp 20   Ht 6' (1.829 m)   Wt 231 lb (104.8 kg)   SpO2 93%   BMI 31.33 kg/m   EKG- AF with controlled rate. Pt currently on Xarelto.       Assessment & Plan:  Todd Zhang comes in today with chief complaint of Medical Management of Chronic Issues   Diagnosis and orders addressed:  1. Primary hypertension Taking medication as prescribed. Discussed avoiding high fat/high sodium foods. Will continue to check BP when he visits the drug store. - CBC with Differential/Platelet - CMP14+EGFR  2. Paroxysmal atrial fibrillation (HCC) Pt in AF today per EKG. Audibly SOB and fatigued. Rate controlled at 72 BPM. Will continue metoprolol and follow up with cardiology referral for management. Continue Xarelto. - EKG 12-Lead - Ambulatory referral to Cardiology  3. Peripheral artery disease (HCC) Discussed keeping hands warm when he begins to have pain or pallor changes  4. Type 2 diabetes mellitus with diabetic neuropathy, with long-term current use of insulin (HCC) Continue medications as prescribed. Low sugar/low carbohydrate diet discussed. Referral to podiatrist for foot ulcer evaluation. - Bayer DCA Hb A1c Waived - metFORMIN (GLUCOPHAGE) 500 MG tablet; Take 1 tablet (500 mg total) by mouth 2 (two) times daily.  Dispense: 60 tablet; Refill: 2  5. COPD exacerbation ([facility])   6. DDD (degenerative disc disease), cervical Increased Neurotin from tid to qid - gabapentin (NEURONTIN) 300 MG capsule; Take 1 capsule (300 mg total) by mouth 4 (four) times daily. TAKE 1 CAPSULE BY MOUTH THREE TIMES DAILY FOR PAIN  Dispense: 120 capsule; Refill: 5  7. Benign prostatic  hyperplasia with incomplete bladder emptying Labs pending  8. Gastroesophageal reflux disease without esophagitis Continue omperazole and avoiding spicy foods  9. Pure  hypercholesterolemia Discussed low fat diet and daily exercise. - Lipid panel - pravastatin (PRAVACHOL) 40 MG tablet; Take 1 tablet (40 mg total) by mouth daily.  Dispense: 90 tablet; Refill: 1 - fenofibrate (TRICOR) 145 MG tablet; Take 1 tablet (145 mg total) by mouth daily.  Dispense: 90 tablet; Refill: 1  10. Chronic low back pain without sciatica, unspecified back pain laterality Increased Neurontin from tid to qid - gabapentin (NEURONTIN) 300 MG capsule; Take 1 capsule (300 mg total) by mouth 4 (four) times daily. TAKE 1 CAPSULE BY MOUTH THREE TIMES DAILY FOR PAIN  Dispense: 120 capsule; Refill: 5  11. Open wound of toe, initial encounter Referral to podiatry for foot care and ulcer evaluation.   - Ambulatory referral to Podiatry - sulfamethoxazole-trimethoprim (BACTRIM DS) 800-160 MG tablet; Take 1 tablet by mouth 2 (two) times daily.  Dispense: 20 tablet; Refill: 0   Labs pending Health Maintenance reviewed Diet and exercise encouraged  Follow up plan: 3 months  Dollene Primrose, RN, BSN, FNP-student   [name] [name], FNP

## 2020-10-18 LAB — CMP14+EGFR
ALT: 31 IU/L (ref 0–44)
AST: 30 IU/L (ref 0–40)
Albumin/Globulin Ratio: 2.1 (ref 1.2–2.2)
Albumin: 4.4 g/dL (ref 3.7–4.7)
Alkaline Phosphatase: 37 IU/L — ABNORMAL LOW (ref 44–121)
BUN/Creatinine Ratio: 16 (ref 10–24)
BUN: 28 mg/dL — ABNORMAL HIGH (ref 8–27)
Bilirubin Total: 0.2 mg/dL (ref 0.0–1.2)
CO2: 22 mmol/L (ref 20–29)
Calcium: 9.3 mg/dL (ref 8.6–10.2)
Chloride: 102 mmol/L (ref 96–106)
Creatinine, Ser: 1.73 mg/dL — ABNORMAL HIGH (ref 0.76–1.27)
GFR calc Af Amer: 44 mL/min/{1.73_m2} — ABNORMAL LOW (ref >59)
GFR calc non Af Amer: 38 mL/min/{1.73_m2} — ABNORMAL LOW (ref >59)
Globulin, Total: 2.1 g/dL (ref 1.5–4.5)
Glucose: 110 mg/dL — ABNORMAL HIGH (ref 65–99)
Potassium: 4.9 mmol/L (ref 3.5–5.2)
Sodium: 137 mmol/L (ref 134–144)
Total Protein: 6.5 g/dL (ref 6.0–8.5)

## 2020-10-18 LAB — CBC WITH DIFFERENTIAL/PLATELET
Basophils Absolute: 0 10*3/uL (ref 0.0–0.2)
Basos: 1 %
EOS (ABSOLUTE): 0.1 10*3/uL (ref 0.0–0.4)
Eos: 2 %
Hematocrit: 37.8 % (ref 37.5–51.0)
Hemoglobin: 12.9 g/dL — ABNORMAL LOW (ref 13.0–17.7)
Immature Grans (Abs): 0 10*3/uL (ref 0.0–0.1)
Immature Granulocytes: 0 %
Lymphocytes Absolute: 2.8 10*3/uL (ref 0.7–3.1)
Lymphs: 37 %
MCH: 31.6 pg (ref 26.6–33.0)
MCHC: 34.1 g/dL (ref 31.5–35.7)
MCV: 93 fL (ref 79–97)
Monocytes Absolute: 0.6 10*3/uL (ref 0.1–0.9)
Monocytes: 8 %
Neutrophils Absolute: 3.9 10*3/uL (ref 1.4–7.0)
Neutrophils: 52 %
Platelets: 196 10*3/uL (ref 150–450)
RBC: 4.08 x10E6/uL — ABNORMAL LOW (ref 4.14–5.80)
RDW: 12.9 % (ref 11.6–15.4)
WBC: 7.4 10*3/uL (ref 3.4–10.8)

## 2020-10-18 LAB — LIPID PANEL
Chol/HDL Ratio: 5.4 ratio — ABNORMAL HIGH (ref 0.0–5.0)
Cholesterol, Total: 223 mg/dL — ABNORMAL HIGH (ref 100–199)
HDL: 41 mg/dL (ref >39)
LDL Chol Calc (NIH): 136 mg/dL — ABNORMAL HIGH (ref 0–99)
Triglycerides: 257 mg/dL — ABNORMAL HIGH (ref 0–149)
VLDL Cholesterol Cal: 46 mg/dL — ABNORMAL HIGH (ref 5–40)

## 2020-10-20 ENCOUNTER — Ambulatory Visit: Payer: Medicare Other | Admitting: Licensed Clinical Social Worker

## 2020-10-20 ENCOUNTER — Telehealth: Payer: Self-pay | Admitting: Nurse Practitioner

## 2020-10-20 DIAGNOSIS — E1140 Type 2 diabetes mellitus with diabetic neuropathy, unspecified: Secondary | ICD-10-CM

## 2020-10-20 DIAGNOSIS — I10 Essential (primary) hypertension: Secondary | ICD-10-CM

## 2020-10-20 DIAGNOSIS — Z794 Long term (current) use of insulin: Secondary | ICD-10-CM

## 2020-10-20 DIAGNOSIS — K219 Gastro-esophageal reflux disease without esophagitis: Secondary | ICD-10-CM

## 2020-10-20 DIAGNOSIS — F411 Generalized anxiety disorder: Secondary | ICD-10-CM

## 2020-10-20 DIAGNOSIS — I480 Paroxysmal atrial fibrillation: Secondary | ICD-10-CM | POA: Diagnosis not present

## 2020-10-20 DIAGNOSIS — J441 Chronic obstructive pulmonary disease with (acute) exacerbation: Secondary | ICD-10-CM

## 2020-10-20 DIAGNOSIS — M5030 Other cervical disc degeneration, unspecified cervical region: Secondary | ICD-10-CM

## 2020-10-20 NOTE — Patient Instructions (Addendum)
Licensed Clinical Social Worker Visit Information  Goals we discussed today:  Goals Addressed              This Visit's Progress   .  COMPLETED: "I need stable housing" (pt-stated)        Current Barriers:  . Lacks caregiver support.  . Financial Constraints in patient with Chronic Diagnoses of Type 2 DM, HTN, Atrial Fibrillation, GERD, COPD, DDD and GAD . Literacy barriers . transportation  Nurse Case Manager Clinical Goal(s):  Todd Kitchen Over the next 30 days, patient will work with CCM team to address needs related to housing options for client in the area   Interventions:  . LCSW has worked with patient regarding housing assistance. Care Guide has mailed housing resource list to client. Client has reviewed housing list received and is on waiting list for apartment complex . LCSW talked with client about current client needs . LCSW talked with client about pain issues of client   Talked with client about apartment at [facility] apartments (he said he has been approved to move to [employer] in [place], [place])  Talked with client about medication procurement of client  Talked with client about his appointment this Thursday with Dr. Ulice Brilliant, podiatry  Talked with client about client shortness of breath occasionally  Talked with client about transport needs of client  Talked with client about appetite of client  Talked with client about vision of client  Talked with client about challenges with skin care of client  Talked with client about sleeping issues of client  Collaborated with RNCM about nursing needs of client  Patient Self Care Activities:   Performs ADL's independently  Performs IADL's independently  See previous goal updates  Has received resource information from care guide and has an appt to see a property in [place]. Care Guide has closed referral.                   Follow Up Plan:LCSW to call client in next 4 weeks to assess the housing  needs of client at that time    .   Todd Kitchen      Materials Provided: No  The patient verbalized understanding of instructions provided today and declined a print copy of patient instruction materials.   Kelton Zhang.Nini Cavan MSW, LCSW Licensed Clinical Social Worker [facility] Care Management [phone]

## 2020-10-20 NOTE — Chronic Care Management (AMB) (Signed)
Chronic Care Management    Clinical Social Work Follow Up Note  10/20/2020 Name: Todd Zhang MRN: [id number] DOB: 11/26/1945  Todd Zhang is a 75 y.o. year old male who is a primary care patient of [name], FNP. The CCM team was consulted for assistance with [name] .   Review of patient status, including review of consultants reports, other relevant assessments, and collaboration with appropriate care team members and the patient's provider was performed as part of comprehensive patient evaluation and provision of chronic care management services.    SDOH (Social Determinants of Health) assessments performed: No; risk for depression; risk for tobacco use; risk for stress; risk for social isolation; risk for physical inactivity  Flowsheet Row Chronic Care Management from 06/13/2019 in [facility]  PHQ-9 Total Score 6      GAD 7 : Generalized Anxiety Score 04/10/2020 01/10/2020 06/13/2019  Nervous, Anxious, on Edge 1 1 1   Control/stop worrying 1 1 1   Worry too much - different things 1 1 1   Trouble relaxing 0 0 1  Restless 0 0 1  Easily annoyed or irritable 0 0 0  Afraid - awful might happen 0 0 0  Total GAD 7 Score 3 3 5   Anxiety Difficulty Not difficult at all Somewhat difficult Somewhat difficult    Outpatient Encounter Medications as of 10/20/2020  Medication Sig  . alendronate (FOSAMAX) 70 MG tablet TAKE 1 TABLET BY MOUTH ONCE A WEEK. TAKE WITH A FULL GLASS OF WATER ON AN EMPTY STOMACH  . cetirizine (ZYRTEC) 10 MG tablet Take 10 mg by mouth daily.   . fenofibrate (TRICOR) 145 MG tablet Take 1 tablet (145 mg total) by mouth daily.  gabapentin (NEURONTIN) 300 MG capsule Take 1 capsule (300 mg total) by mouth 4 (four) times daily. TAKE 1 CAPSULE BY MOUTH THREE TIMES DAILY FOR PAIN  . glucose blood (ACCU-CHEK AVIVA PLUS) test strip USE TO check blood glucose 4 times daily  . HYDROcodone-acetaminophen (NORCO/VICODIN) 5-325 MG tablet  Take 1 tablet by mouth every 4 (four) hours as needed.  . insulin regular (HUMULIN R) 100 units/mL injection INJECT 10 UNITS INTO THE SKIN THREE TIMES DAILY BEFORE MEALS  . Insulin Syringe-Needle U-100 (GLOBAL INJECT EASE INSULIN SYR) 31G X 5/16" 0.3 ML MISC Use with insulin Dx E11.9  . metFORMIN (GLUCOPHAGE) 500 MG tablet Take 1 tablet (500 mg total) by mouth 2 (two) times daily.  . metoprolol succinate (TOPROL-XL) 50 MG 24 hr tablet TAKE 1 TABLET BY MOUTH DAILY, TAKE WITH FOOD OR IMMEDIATELY FOLLOWING A MEAL  . Multiple Vitamin (MULTIVITAMIN) tablet Take 1 tablet by mouth daily.  . mupirocin ointment (BACTROBAN) 2 % APPLY TO LEFT GREAT TOE  . omeprazole (PRILOSEC) 20 MG capsule TAKE 1 CAPSULE BY MOUTH EVERY DAY  . pravastatin (PRAVACHOL) 40 MG tablet Take 1 tablet (40 mg total) by mouth daily.  sulfamethoxazole-trimethoprim (BACTRIM DS) 800-160 MG tablet Take 1 tablet by mouth 2 (two) times daily.  . tamsulosin (FLOMAX) 0.4 MG CAPS capsule Take 1 capsule (0.4 mg total) by mouth daily.  12/18/2020 triamcinolone cream (KENALOG) 0.5 % APPLY TO THE AFFECTED AREA(S) THREE TIMES DAILY  . vitamin B-12 (CYANOCOBALAMIN) 500 MCG tablet Take 500 mcg by mouth daily.  [name] 20 MG TABS tablet Take 1 tablet (20 mg total) by mouth daily.   No facility-administered encounter medications on file as of 10/20/2020.    Goals Addressed  This Visit's Progress   .   "I need stable housing" (pt-stated)        Current Barriers:  . Lacks caregiver support.  . Financial Constraints in patient with Chronic Diagnoses of Type 2 DM, HTN, Atrial Fibrillation, GERD, COPD, DDD and GAD . Literacy barriers . transportation  Nurse Case Manager Clinical Goal(s):  [name] Over the next 30 days, patient will work with CCM team to address needs related to housing options for client in the area   Interventions:  . LCSW has worked with patient regarding housing assistance. Care Guide has mailed housing resource list to  client. Client has reviewed housing list received and is on waiting list for apartment complex . LCSW talked with client about current client needs . LCSW talked with client about pain issues of client .  Talked with client about apartment at [facility] apartments (he said he has been approved to move to [employer] in [place], [place]) . Talked with client about medication procurement of client . Talked with client about his appointment this Thursday with Dr. Ulice Brilliant, podiatry . Talked with client about client shortness of breath occasionally . Talked with client about transport needs of client . Talked with client about appetite of client . Talked with client about vision of client . Talked with client about challenges with skin care of client . Talked with client about sleeping issues of client . Collaborated with RNCM about nursing needs of client  Patient Self Care Activities:  . Performs ADL's independently . Performs IADL's independently  See previous goal updates  Has received resource information from care guide and has an appt to see a property in [place]. Care Guide has closed referral.        Follow Up Plan: LCSW to call client in next 4 weeks to assess the housing needs of client at that time  Kelton Pillar.Courtney Fenlon MSW, LCSW Licensed Clinical Social Worker [facility] Care Management [phone]

## 2020-10-24 ENCOUNTER — Encounter: Payer: Self-pay | Admitting: Nurse Practitioner

## 2020-10-29 ENCOUNTER — Encounter: Payer: Self-pay | Admitting: General Practice

## 2020-10-30 ENCOUNTER — Telehealth: Payer: Medicare Other

## 2020-11-02 ENCOUNTER — Other Ambulatory Visit: Payer: Self-pay | Admitting: Nurse Practitioner

## 2020-11-11 NOTE — Chronic Care Management (AMB) (Signed)
Chronic Care Management   CCM RN Visit Note  10/14/2020 Name: Todd Zhang MRN: [id number] DOB: 25-Jul-1946  Subjective: Todd Zhang is a 75 y.o. year old male who is a primary care patient of [name], FNP. The care management team was consulted for assistance with disease management and care coordination needs.    Engaged with patient by telephone for follow up visit in response to provider referral for case management and/or care coordination services.   Consent to Services:  The patient was given information about Chronic Care Management services, agreed to services, and gave verbal consent prior to initiation of services.  Please see initial visit note for detailed documentation.   Patient agreed to services and verbal consent obtained.   Assessment: Review of patient past medical history, allergies, medications, health status, including review of consultants reports, laboratory and other test data, was performed as part of comprehensive evaluation and provision of chronic care management services.   SDOH (Social Determinants of Health) assessments and interventions performed:    CCM Care Plan  Allergies  Allergen Reactions   Asa [Aspirin] Hives   Penicillins Hives    Outpatient Encounter Medications as of 10/14/2020  Medication Sig   cetirizine (ZYRTEC) 10 MG tablet Take 10 mg by mouth daily.    glucose blood (ACCU-CHEK AVIVA PLUS) test strip USE TO check blood glucose 4 times daily   HYDROcodone-acetaminophen (NORCO/VICODIN) 5-325 MG tablet Take 1 tablet by mouth every 4 (four) hours as needed.   insulin regular (HUMULIN R) 100 units/mL injection INJECT 10 UNITS INTO THE SKIN THREE TIMES DAILY BEFORE MEALS   Insulin Syringe-Needle U-100 (GLOBAL INJECT EASE INSULIN SYR) 31G X 5/16" 0.3 ML MISC Use with insulin Dx E11.9   metoprolol succinate (TOPROL-XL) 50 MG 24 hr tablet TAKE 1 TABLET BY MOUTH DAILY, TAKE WITH FOOD OR IMMEDIATELY FOLLOWING A MEAL    Multiple Vitamin (MULTIVITAMIN) tablet Take 1 tablet by mouth daily.   mupirocin ointment (BACTROBAN) 2 % APPLY TO LEFT GREAT TOE   omeprazole (PRILOSEC) 20 MG capsule TAKE 1 CAPSULE BY MOUTH EVERY DAY   tamsulosin (FLOMAX) 0.4 MG CAPS capsule Take 1 capsule (0.4 mg total) by mouth daily.   triamcinolone cream (KENALOG) 0.5 % APPLY TO THE AFFECTED AREA(S) THREE TIMES DAILY   vitamin B-12 (CYANOCOBALAMIN) 500 MCG tablet Take 500 mcg by mouth daily.   XARELTO 20 MG TABS tablet Take 1 tablet (20 mg total) by mouth daily.   [DISCONTINUED] alendronate (FOSAMAX) 70 MG tablet TAKE 1 TABLET BY MOUTH ONCE A WEEK. TAKE WITH A FULL GLASS OF WATER ON AN EMPTY STOMACH   [DISCONTINUED] fenofibrate (TRICOR) 145 MG tablet Take 1 tablet (145 mg total) by mouth daily.   [DISCONTINUED] gabapentin (NEURONTIN) 300 MG capsule TAKE 1 CAPSULE BY MOUTH THREE TIMES DAILY FOR PAIN   [DISCONTINUED] metFORMIN (GLUCOPHAGE) 500 MG tablet Take 1 tablet (500 mg total) by mouth 2 (two) times daily.   [DISCONTINUED] pravastatin (PRAVACHOL) 40 MG tablet Take 1 tablet (40 mg total) by mouth daily.   [DISCONTINUED] predniSONE (DELTASONE) 20 MG tablet 2 po at sametime daily for 5 days   No facility-administered encounter medications on file as of 10/14/2020.    Patient Active Problem List   Diagnosis Date Noted   Pure hypercholesterolemia 07/16/2020   Peripheral artery disease (HCC)    COPD exacerbation (HCC) 05/14/2019   DDD (degenerative disc disease), cervical 11/06/2018   Cervical spinal stenosis 11/06/2018   Generalized anxiety disorder 11/29/2017  Benign prostatic hyperplasia with incomplete bladder emptying 11/28/2017   Gastroesophageal reflux disease without esophagitis 09/07/2017   Essential hypertension 09/01/2017   Onychomycosis 09/01/2017   Atrial fibrillation (HCC) 09/01/2017   Type 2 diabetes mellitus with diabetic neuropathy, with long-term current use of insulin (HCC) 06/19/2014    Back pain 06/19/2014   Neck pain 06/19/2014    Conditions to be addressed/monitored:HTN and DMII  Care Plan : RNCM: Diabetes, Type 2  Updates made by Gwenith Daily, RN since 11/11/2020 12:00 AM    Problem: Glycemic Management (Diabetes, Type 2)   Priority: Medium    Long-Range Goal: Glycemic Management Optimized   This Visit's Progress: On track  Recent Progress: On track  Priority: Medium  Note:   Current Barriers:   Chronic Disease Management support and education needs related to diabetes  Lacks social connections  Nurse Case Manager Clinical Goal(s):   Over the next 30 days, patient will verbalize understanding of plan for diabetes management  Over the next 30 days, patient will work with [name] to address needs related to self-management of diabetes  Interventions:   1:1 collaboration with [name], FNP regarding development and update of comprehensive plan of care as evidenced by provider attestation and co-signature  Inter-disciplinary care team collaboration (see longitudinal plan of care)  Evaluation of current treatment plan related to diabetes and patient's adherence to plan as established by provider.  Chart reviewed including relevant office notes and lab results  Discussed recent A1C  Discussed home blood sugar testing o 3 times a day and PRN o Normally runs "good"  Discussed diet o Trying to avoid fried/fatty foods and simple carbs  Reviewed upcoming appointment with PCP  Encouraged patient to continue checking and recording blood sugar 3 times a day and bring log to PCP appt  Call PCP with any readings outside of recommended range  Provided with RNCM contact number and encouraged to reach out as needed  Patient Goals/Self-Care Activities Over the next 30 days, patient will:  check blood sugar at prescribed times  check blood sugar if I feel it is too high or too low  enter blood sugar readings and medication or  insulin into daily log  take the blood sugar log to all doctor visits  Take medication as prescribed  Plan meals: avoid fried/fatty foods, decrease or eliminate sugar/simple carbs  Keep follow-up appointments with PCP  Call PCP with any readings outside of recommended range    Problem: Non-healing Wound Left Foot   Priority: High    Long-Range Goal: Diabetic Foot Care/Wound Care   This Visit's Progress: Not on track  Recent Progress: Not on track  Priority: High  Note:   Current Barriers:   Care Coordination needs related to foot wound in a patient with diabetes and hypertension  Lacks social connections  Nurse Case Manager Clinical Goal(s):   Over the next 30 days, patient will verbalize understanding of plan for wound care  Over the next 30 days, patient will work with [name] to address needs related to diabetic wound of foot  Over the next 14 days, patient will schedule follow-up appointment with podiatrist  Interventions:   1:1 collaboration with [name], FNP regarding development and update of comprehensive plan of care as evidenced by provider attestation and co-signature  Inter-disciplinary care team collaboration (see longitudinal plan of care)  Chart reviewed including relevant office notes and lab results  Discussed diabetic foot care o Has followed up with Dr  Ulice Brilliant  Previously strongly advised patient to d/c using alcohol and peroxide to clean the wound because it is damaging to the healing tissue and will not promote new tissue growth. Patient disagreed stating that it was the only thing that worked and kept it "dry"  Reinforced basic wound care education o Encouraged patient to clean with saline or mild soap and water and to apply his prescription "ointment" or Vaseline and cover with gauze.  o Change gauze daily and if wet or soiled.   Strongly advised to keep follow-up with podiatrist, [name]  Encouraged patient to keep  appt with PCP  Reach out to PCP or [name] with any new or worsening symptoms  Signs of infection discussed  Provided with RN Care Manager contact number and encouraged to reach out as needed  Patient Goals/Self-Care Activities Over the next 30 days, patient will:  check feet daily for cuts, sores or redness  keep feet up while sitting  wash and dry feet carefully every day  wear comfortable, cotton socks  wear comfortable, well-fitting shoes   Keep follow-up appointment with podiatrist, [name]  Change dressing daily and as needed if soiled or wet  DO NOT USE alcohol or peroxide to clean the wound  Use saline or water and a gentle soap to clean the wound daily   Do not soak in water  Apply vaseline or the ointment provided by your doctor to the wound daily and cover with gauze  Take note of any signs of infections: redness, swelling, drainage, odor, increased pain  Call PCP or [name] with any new or worsening symptoms     Care Plan : RNCM: Hypertension (Adult)  Updates made by Gwenith Daily, RN since 11/11/2020 12:00 AM    Problem: Hypertension     Long-Range Goal: Hypertension Monitored   This Visit's Progress: Not on track  Recent Progress: Not on track  Priority: Medium  Note:   Current Barriers:   Chronic Disease Management support and education needs related to hypertension  Lacks social connections  Does not check blood pressure regularly  Nurse Case Manager Clinical Goal(s):   Over the next 30 days, patient will verbalize understanding of plan for hypertension management  Over the next 30 days, patient will work with [name] to address needs related to self-management of hypertension  Interventions:   1:1 collaboration with [name], FNP regarding development and update of comprehensive plan of care as evidenced by provider attestation and co-signature  Inter-disciplinary care team collaboration (see longitudinal plan  of care)  Evaluation of current treatment plan related to hypertension and patient's adherence to plan as established by provider.  Chart reviewed including relevant office notes and lab results  Discussed recent in-office blood pressure readings  Reviewed and discussed medications  Discussed plans with patient for ongoing care management follow up and provided patient with direct contact information for care management team  Advised patient, providing education and rationale, to monitor blood pressure 3 times a week and record, calling [phone] for findings outside established parameters.   Reviewed upcoming appointment with PCP  Provided with RN Care Manager contact number and encouraged to reach out as needed  Patient Goals/Self-Care Activities Over the next 30 days, patient will:  check blood pressure 3 times per week  choose a place to take my blood pressure (home, clinic or office, retail store)  write blood pressure results in a log or diary   Bring blood pressure log to  PCP appointment  Call PCP with any readings outside of recommended range  Take medication as directed  Watch salt/sodium in diet          Follow Up Plan:  Telephone follow up appointment with care management team member scheduled for: RN Care Manager 12/09/20, LCSW 11/20/20 The patient has been provided with contact information for the care management team and has been advised to call with any health related questions or concerns.  Next PCP appointment scheduled for: 10/17/20 with Bennie PieriniMary Margaret Martin, FNP  Demetrios LollKristen Jenaye Rickert, BSN, RN-BC Embedded Chronic Care Manager [facility] Care Management Direct Dial: [phone]

## 2020-11-11 NOTE — Patient Instructions (Signed)
Visit Information  PATIENT GOALS: Goals Addressed            This Visit's Progress   . Diabetic Foot Care/Wound Care   Not on track    Timeframe:  Long-Range Goal Priority:  High Start Date:                             Expected End Date:                       Follow Up Date 12/09/20   . check feet daily for cuts, sores or redness . keep feet up while sitting . wash and dry feet carefully every day . wear comfortable, cotton socks . wear comfortable, well-fitting shoes  . Keep follow-up appointment with podiatrist, [name] . Change dressing daily and as needed if soiled or wet . DO NOT USE alcohol or peroxide to clean the wound . Use saline or water and a gentle soap to clean the wound daily  . Do not soak in water . Apply vaseline or the ointment provided by your doctor to the wound daily and cover with gauze . Take note of any signs of infections: redness, swelling, drainage, odor, increased pain . Call PCP or [name] with any new or worsening symptoms   Why is this important?    Good foot care is very important when you have diabetes.   There are many things you can do to keep your feet healthy and catch a problem early.    Notes:     [name] Monitor and Manage My Blood Sugar-Diabetes Type 2   On track    Timeframe:  Long-Range Goal Priority:  Medium Start Date:                             Expected End Date:                       Follow Up Date 12/09/20   . check blood sugar at prescribed times . check blood sugar if I feel it is too high or too low . enter blood sugar readings and medication or insulin into daily log . take the blood sugar log to all doctor visits . Take medication as prescribed . Plan meals: avoid fried/fatty foods, decrease or eliminate sugar/simple carbs . Keep follow-up appointments with PCP . Call PCP with any readings outside of recommended range   Why is this important?    Checking your blood sugar at home helps to keep it from getting  very high or very low.   Writing the results in a diary or log helps the doctor know how to care for you.   Your blood sugar log should have the time, date and the results.   Also, write down the amount of insulin or other medicine that you take.   Other information, like what you ate, exercise done and how you were feeling, will also be helpful.     Notes:     . Track and Manage My Blood Pressure-Hypertension   Not on track    Timeframe:  Long-Range Goal Priority:  Medium Start Date:                             Expected End Date:  Follow Up Date 12/09/20   . check blood pressure 3 times per week . choose a place to take my blood pressure (home, clinic or office, retail store) . write blood pressure results in a log or diary  . Bring blood pressure log to PCP appointment . Call PCP with any readings outside of recommended range . Take medication as directed . Watch salt/sodium in diet   Why is this important?    You won't feel high blood pressure, but it can still hurt your blood vessels.   High blood pressure can cause heart or kidney problems. It can also cause a stroke.   Making lifestyle changes like losing a little weight or eating less salt will help.   Checking your blood pressure at home and at different times of the day can help to control blood pressure.   If the doctor prescribes medicine remember to take it the way the doctor ordered.   Call the office if you cannot afford the medicine or if there are questions about it.     Notes:        Patient verbalizes understanding of instructions provided today and agrees to view in MyChart.    Follow Up Plan:  Telephone follow up appointment with care management team member scheduled for: RN Care Manager 12/09/20, LCSW 11/20/20 The patient has been provided with contact information for the care management team and has been advised to call with any health related questions or concerns.  Next  PCP appointment scheduled for: 10/17/20 with [name], FNP  Todd Zhang, BSN, RN-BC Embedded Chronic Care Manager [facility] Care Management Direct Dial: [phone]

## 2020-11-20 ENCOUNTER — Telehealth: Payer: Medicare Other

## 2020-11-27 ENCOUNTER — Ambulatory Visit (INDEPENDENT_AMBULATORY_CARE_PROVIDER_SITE_OTHER): Payer: Medicare Other | Admitting: *Deleted

## 2020-11-27 DIAGNOSIS — Z0000 Encounter for general adult medical examination without abnormal findings: Secondary | ICD-10-CM

## 2020-11-27 NOTE — Progress Notes (Signed)
MEDICARE ANNUAL WELLNESS VISIT  11/27/2020  Telephone Visit Disclaimer This Medicare AWV was conducted by telephone due to national recommendations for restrictions regarding the COVID-19 Pandemic (e.g. social distancing).  I verified, using two identifiers, that I am speaking with Todd Zhang or their authorized healthcare agent. I discussed the limitations, risks, security, and privacy concerns of performing an evaluation and management service by telephone and the potential availability of an in-person appointment in the future. The patient expressed understanding and agreed to proceed.  Location of Patient: Home Location of Provider (nurse):  [facility]  Subjective:    Todd Zhang is a 75 y.o. male patient of Todd Pierini, FNP who had a Medicare Annual Wellness Visit today via telephone. [name] is Retired and lives alone. he has 2 children that he does not see often. He reports that he is socially active and does interact with friends/family regularly. He is minimally physically active and enjoys watching tv.  Patient Care Team: Todd Pierini, FNP as PCP - General (Family Medicine) Todd Batman, MD as Consulting Physician (Orthopedic Surgery) Todd Zhang, DPM as Consulting Physician (Podiatry) Todd Zhang, Todd Pillar, LCSW as Social Worker (Licensed Clinical Social Worker) Todd Daily, RN as Case Manager  Advanced Directives 11/27/2020 11/27/2019 11/22/2018 10/26/2017  Does Patient Have a Medical Advance Directive? No No No No  Would patient like information on creating a medical advance directive? No - Patient declined No - Patient declined Yes (MAU/Ambulatory/Procedural Areas - Information given) Yes (ED - Information included in AVS)    Hospital Utilization Over the Past 12 Months: # of hospitalizations or ER visits: 1 # of surgeries: 0  Review of Systems    Patient reports that his overall health is worse compared to last year.  Since the last time he fell.   History obtained from chart review and the patient  Patient Reported Readings (BP, Pulse, CBG, Weight, etc) none  Pain Assessment       Current Medications & Allergies (verified) Allergies as of 11/27/2020      Reactions   Asa [aspirin] Hives   Penicillins Hives      Medication List       Accurate as of November 27, 2020  9:34 AM. If you have any questions, ask your nurse or doctor.        STOP taking these medications   sulfamethoxazole-trimethoprim 800-160 MG tablet Commonly known as: Bactrim DS     TAKE these medications   Accu-Chek Aviva Plus test strip Generic drug: glucose blood USE TO check blood glucose 4 times Zhang   alendronate 70 MG tablet Commonly known as: FOSAMAX TAKE 1 TABLET BY MOUTH ONCE A WEEK. TAKE WITH A FULL GLASS OF WATER ON AN EMPTY STOMACH   benazepril 5 MG tablet Commonly known as: LOTENSIN Take 5 mg by mouth Zhang.   cetirizine 10 MG tablet Commonly known as: ZYRTEC Take 10 mg by mouth Zhang.   fenofibrate 145 MG tablet Commonly known as: TRICOR Take 1 tablet (145 mg total) by mouth Zhang.   gabapentin 300 MG capsule Commonly known as: NEURONTIN Take 1 capsule (300 mg total) by mouth 4 (four) times Zhang. TAKE 1 CAPSULE BY MOUTH THREE TIMES Zhang FOR PAIN   HYDROcodone-acetaminophen 5-325 MG tablet Commonly known as: NORCO/VICODIN Take 1 tablet by mouth every 4 (four) hours as needed.   insulin regular 100 units/mL injection Commonly known as: HumuLIN R INJECT 10 UNITS INTO THE SKIN THREE TIMES  Zhang BEFORE MEALS   Insulin Syringe-Needle U-100 31G X 5/16" 0.3 ML Misc Commonly known as: Global Inject Ease Insulin Syr Use with insulin Dx E11.9   metFORMIN 500 MG tablet Commonly known as: GLUCOPHAGE Take 1 tablet (500 mg total) by mouth 2 (two) times Zhang.   metoprolol succinate 50 MG 24 hr tablet Commonly known as: TOPROL-XL TAKE 1 TABLET BY MOUTH Zhang, TAKE WITH FOOD OR IMMEDIATELY  FOLLOWING A MEAL   multivitamin tablet Take 1 tablet by mouth Zhang.   mupirocin ointment 2 % Commonly known as: BACTROBAN APPLY TO LEFT GREAT TOE   omeprazole 20 MG capsule Commonly known as: PRILOSEC TAKE 1 CAPSULE BY MOUTH EVERY DAY   pravastatin 40 MG tablet Commonly known as: PRAVACHOL Take 1 tablet (40 mg total) by mouth Zhang.   tamsulosin 0.4 MG Caps capsule Commonly known as: FLOMAX Take 1 capsule (0.4 mg total) by mouth Zhang.   triamcinolone cream 0.5 % Commonly known as: KENALOG APPLY TO THE AFFECTED AREA(S) THREE TIMES Zhang   vitamin B-12 500 MCG tablet Commonly known as: CYANOCOBALAMIN Take 500 mcg by mouth Zhang.   Xarelto 20 MG Tabs tablet Generic drug: rivaroxaban Take 1 tablet (20 mg total) by mouth Zhang.       History (reviewed): Past Medical History:  Diagnosis Date  . Atrial fibrillation (HCC)   . Cervical spinal stenosis   . DDD (degenerative disc disease), cervical   . DDD (degenerative disc disease), lumbar   . Diabetes mellitus without complication (HCC)   . Hyperlipidemia   . Hypertension   . Peripheral artery disease [facility](HCC)    Past Surgical History:  Procedure Laterality Date  . AMPUTATION TOE    . KNEE SURGERY Left   . NOSE SURGERY     Family History  Problem Relation Age of Onset  . Stroke Mother   . Heart disease Mother        CABG  . Cancer Father    Social History   Socioeconomic History  . Marital status: Divorced    Spouse name: Not on file  . Number of children: 2  . Years of education: 2611  . Highest education level: 11th grade  Occupational History  . Occupation: Disabled  Tobacco Use  . Smoking status: Former Smoker    Packs/day: 1.00    Types: Cigarettes    Start date: 06/19/1966    Quit date: 06/20/2011    Years since quitting: 9.4  . Smokeless tobacco: Never Used  Vaping Use  . Vaping Use: Never used  Substance and Sexual Activity  . Alcohol use: No    Comment: quit drinking in 1982  . Drug use:  Not Currently    Comment: marijuana and cocaine in the past, quit in 2012  . Sexual activity: Not Currently  Other Topics Concern  . Not on file  Social History Narrative  . Not on file   Social Determinants of Health   Financial Resource Strain: Not on file  Food Insecurity: Not on file  Transportation Needs: Not on file  Physical Activity: Not on file  Stress: Not on file  Social Connections: Not on file    Activities of Zhang Living In your present state of health, do you have any difficulty performing the following activities: 11/27/2020  Hearing? N  Vision? N  Comment Wears glasses  Difficulty concentrating or making decisions? Y  Comment He states sometimes he will forget what he needs to do  Walking or climbing stairs?  Y  Comment Due to foot  Dressing or bathing? N  Doing errands, shopping? N  Preparing Food and eating ? N  Using the Toilet? N  In the past six months, have you accidently leaked urine? Y  Do you have problems with loss of bowel control? Y  Managing your Medications? N  Managing your Finances? N  Housekeeping or managing your Housekeeping? N  Some recent data might be hidden    Patient Education/ Literacy How often do you need to have someone help you when you read instructions, pamphlets, or other written materials from your doctor or pharmacy?: 5 - Always (Patient has a hard time reading and writing)  Exercise Current Exercise Habits: The patient does not participate in regular exercise at present  Diet Patient reports consuming 2 meals a day and 2 snack(s) a day Patient reports that his primary diet is: Regular Patient reports that she does have regular access to food.   Depression Screen PHQ 2/9 Scores 11/27/2020 05/08/2020 04/10/2020 01/10/2020 11/27/2019 08/24/2019 06/13/2019  PHQ - 2 Score 0 0 0 0 0 0 2  PHQ- 9 Score - - - - - - 6     Fall Risk Fall Risk  11/27/2020 10/17/2020 07/16/2020 05/08/2020 04/10/2020  Falls in the past year? 1 1 1  0 0   Comment - - - - -  Number falls in past yr: 1 1 0 - -  Injury with Fall? 1 0 1 - -  Risk Factor Category  - - - - -  Risk for fall due to : History of fall(s) History of fall(s) History of fall(s) - -  Risk for fall due to: Comment - - - - -  Follow up Falls prevention discussed Education provided Education provided - -     Objective:  seemed alert and oriented and he participated appropriately during our telephone visit.  Blood Pressure Weight BMI  BP Readings from Last 3 Encounters:  10/17/20 124/65  07/16/20 119/64  05/08/20 107/68   Wt Readings from Last 3 Encounters:  10/17/20 231 lb (104.8 kg)  07/16/20 225 lb (102.1 kg)  05/08/20 224 lb (101.6 kg)   BMI Readings from Last 1 Encounters:  10/17/20 31.33 kg/m    *Unable to obtain current vital signs, weight, and BMI due to telephone visit type  Hearing/Vision  . Xylan did not seem to have difficulty with hearing/understanding during the telephone conversation . Reports that he has had a formal eye exam by an eye care professional within the past year . Reports that he has not had a formal hearing evaluation within the past year *Unable to fully assess hearing and vision during telephone visit type  Cognitive Function: 6CIT Screen 11/27/2020 11/27/2019 11/22/2018  What Year? 0 points - 0 points  What month? 0 points - 0 points  What time? 0 points 0 points 0 points  Count back from 20 0 points 0 points 0 points  Months in reverse 0 points 2 points 0 points  Repeat phrase 6 points 10 points 4 points  Total Score 6 - 4   (Normal:0-7, Significant for Dysfunction: >8)  Normal Cognitive Function Screening: Yes   Immunization & Health Maintenance Record Immunization History  Administered Date(s) Administered  . Fluad Quad(high Dose 65+) 06/18/2020  . Influenza Split 10/01/2016  . Influenza, High Dose Seasonal PF 05/31/2017, 04/26/2019  . Influenza,inj,Quad PF,6+ Mos 06/19/2014  . Influenza,inj,quad,  With Preservative 05/23/2018  . Moderna Sars-Covid-2 Vaccination 10/25/2019, 11/22/2019,  07/23/2020  . Pneumococcal Conjugate-13 05/12/2015  . Pneumococcal Polysaccharide-23 10/26/2017  . Tdap 05/12/2015  . Zoster 05/20/2015    Health Maintenance  Topic Date Due  . URINE MICROALBUMIN  08/23/2020  . OPHTHALMOLOGY EXAM  01/14/2021 (Originally 08/13/2020)  . COLONOSCOPY (Pts 45-78yrs Insurance coverage will need to be confirmed)  10/17/2021 (Originally 06/19/2020)  . Hepatitis C Screening  10/17/2021 (Originally 09-Apr-1946)  . HEMOGLOBIN A1C  04/16/2021  . FOOT EXAM  10/17/2021  . TETANUS/TDAP  05/11/2025  . INFLUENZA VACCINE  Completed  . COVID-19 Vaccine  Completed  . PNA vac Low Risk Adult  Completed  . HPV VACCINES  Aged Out       Assessment  This is a routine wellness examination for Todd Zhang.  Health Maintenance: Due or Overdue Health Maintenance Due  Topic Date Due  . URINE MICROALBUMIN  08/23/2020    Todd Zhang does not need a referral for Community Assistance: Patient is currently enrolled in CCM  Care Management:   Already enrolled Social Work:    Already enrolled Prescription Assistance:  no Nutrition/Diabetes Education:  no   Plan:  Personalized Goals Goals Addressed            This Visit's Progress   . AWV       11/27/2020 AWV Goal: Fall Prevention  . Over the next year, patient will decrease their risk for falls by: o Using assistive devices, such as a cane or walker, as needed o Identifying fall risks within their home and correcting them by: - Removing throw rugs - Adding handrails to stairs or ramps - Removing clutter and keeping a clear pathway throughout the home - Increasing light, especially at night - Adding shower handles/bars - Raising toilet seat o Identifying potential personal risk factors for falls: - Medication side effects - Incontinence/urgency - Vestibular dysfunction - Hearing loss - Musculoskeletal  disorders - Neurological disorders - Orthostatic hypotension        Personalized Health Maintenance & Screening Recommendations  Colorectal cancer screening Keep yearly eye exam  Lung Cancer Screening Recommended: no (Low Dose CT Chest recommended if Age 76-80 years, 30 pack-year currently smoking OR have quit w/in past 15 years) Hepatitis C Screening recommended: no HIV Screening recommended: no  Advanced Directives: Written information was not prepared per patient's request.  Referrals & Orders No orders of the defined types were placed in this encounter.   Follow-up Plan . Follow-up with Todd Pierini, FNP as planned . Schedule appointment to discuss the side problems that you are having from fall . AVS printed and mailed to patient    I have personally reviewed and noted the following in the patient's chart:   . Medical and social history . Use of alcohol, tobacco or illicit drugs  . Current medications and supplements . Functional ability and status . Nutritional status . Physical activity . Advanced directives . List of other physicians . Hospitalizations, surgeries, and ER visits in previous 12 months . Vitals . Screenings to include cognitive, depression, and falls . Referrals and appointments  In addition, I have reviewed and discussed with Todd Zhang certain preventive protocols, quality metrics, and best practice recommendations. A written personalized care plan for preventive services as well as general preventive health recommendations is available and can be mailed to the patient at his request.      Sherron Monday  11/27/2020

## 2020-11-27 NOTE — Patient Instructions (Signed)
  MEDICARE ANNUAL WELLNESS VISIT Health Maintenance Summary and Written Plan of Care  Mr. Bolds ,  Thank you for allowing me to perform your Medicare Annual Wellness Visit and for your ongoing commitment to your health.   Health Maintenance & Immunization History Health Maintenance  Topic Date Due  . OPHTHALMOLOGY EXAM  01/14/2021 (Originally 08/13/2020)  . COLONOSCOPY (Pts 45-61yrs Insurance coverage will need to be confirmed)  10/17/2021 (Originally 06/19/2020)  . Hepatitis C Screening  10/17/2021 (Originally 1946-02-01)  . HEMOGLOBIN A1C  04/16/2021  . FOOT EXAM  10/17/2021  . TETANUS/TDAP  05/11/2025  . INFLUENZA VACCINE  Completed  . COVID-19 Vaccine  Completed  . PNA vac Low Risk Adult  Completed  . HPV VACCINES  Aged Out   Immunization History  Administered Date(s) Administered  . Fluad Quad(high Dose 65+) 06/18/2020  . Influenza Split 10/01/2016  . Influenza, High Dose Seasonal PF 05/31/2017, 04/26/2019  . Influenza,inj,Quad PF,6+ Mos 06/19/2014  . Influenza,inj,quad, With Preservative 05/23/2018  . Moderna Sars-Covid-2 Vaccination 10/25/2019, 11/22/2019, 07/23/2020  . Pneumococcal Conjugate-13 05/12/2015  . Pneumococcal Polysaccharide-23 10/26/2017  . Tdap 05/12/2015  . Zoster 05/20/2015    These are the patient goals that we discussed: Goals Addressed            This Visit's Progress   . AWV       11/27/2020 AWV Goal: Fall Prevention  . Over the next year, patient will decrease their risk for falls by: o Using assistive devices, such as a cane or walker, as needed o Identifying fall risks within their home and correcting them by: - Removing throw rugs - Adding handrails to stairs or ramps - Removing clutter and keeping a clear pathway throughout the home - Increasing light, especially at night - Adding shower handles/bars - Raising toilet seat o Identifying potential personal risk factors for falls: - Medication side  effects - Incontinence/urgency - Vestibular dysfunction - Hearing loss - Musculoskeletal disorders - Neurological disorders - Orthostatic hypotension          This is a list of Health Maintenance Items that are overdue or due now: There are no preventive care reminders to display for this patient.   Orders/Referrals Placed Today: No orders of the defined types were placed in this encounter.  (Contact our referral department at [phone] if you have not spoken with someone about your referral appointment within the next 5 days)    Follow-up Plan  . Follow-up with [name], FNP as planned . Schedule appointment to discuss the side problems that you are having from fall

## 2020-12-01 ENCOUNTER — Other Ambulatory Visit: Payer: Self-pay | Admitting: Nurse Practitioner

## 2020-12-03 ENCOUNTER — Other Ambulatory Visit: Payer: Self-pay | Admitting: Nurse Practitioner

## 2020-12-03 DIAGNOSIS — Z794 Long term (current) use of insulin: Secondary | ICD-10-CM

## 2020-12-03 DIAGNOSIS — E1140 Type 2 diabetes mellitus with diabetic neuropathy, unspecified: Secondary | ICD-10-CM

## 2020-12-09 ENCOUNTER — Telehealth: Payer: Medicare Other | Admitting: *Deleted

## 2020-12-09 ENCOUNTER — Telehealth: Payer: Self-pay | Admitting: *Deleted

## 2020-12-09 NOTE — Telephone Encounter (Signed)
  Chronic Care Management   Outreach Note  12/09/2020 Name: Todd Zhang MRN: [id number] DOB: [birth year]  Referred by: [name], FNP Reason for referral : Chronic Care Management (Unsuccessful telephone follow-up)   An unsuccessful telephone outreach was attempted today. The patient was referred to the case management team for assistance with care management and care coordination.   Follow Up Plan: Forwarding to Care Guides to reschedule telephone follow-up  [name], BSN, RN-BC Embedded Chronic Care Manager [facility] Care Management Direct Dial: [phone]

## 2020-12-10 NOTE — Telephone Encounter (Signed)
Please reschedule

## 2020-12-11 ENCOUNTER — Telehealth: Payer: Self-pay | Admitting: *Deleted

## 2020-12-11 NOTE — Chronic Care Management (AMB) (Signed)
  Care Management   Note  12/11/2020 Name: [name] MRN: [id number] DOB: 11/03/1945  Todd Zhang is a 75 y.o. year old male who is a primary care patient of [name], FNP and is actively engaged with the care management team. I reached out to [name] by phone today to assist with re-scheduling a follow up visit with the RN Case Manager  Follow up plan: Unsuccessful telephone outreach attempt made. The care management team will reach out to the patient again over the next 7 days.  If patient returns call to provider office, please advise to call Embedded Care Management Care Guide Avie Arenas at [phone]  Jarica Plass [facility] Guide, Embedded Care Coordination [facility]  Care Management

## 2020-12-16 NOTE — Chronic Care Management (AMB) (Signed)
  Care Management   Note  12/16/2020 Name: Todd Zhang MRN: [id number] DOB: [birth year]  AKING KLABUNDE is a 75 y.o. year old male who is a primary care patient of [name], FNP and is actively engaged with the care management team. I reached out to [name] by phone today to assist with re-scheduling a follow up visit with the RN Case Manager  Follow up plan: Telephone appointment with care management team member scheduled for:12/18/2020 Anaijah Augsburger [facility] Guide, Embedded Care Coordination [facility]  Care Management

## 2020-12-16 NOTE — Telephone Encounter (Signed)
Rescheduled 12/18/2020

## 2020-12-18 ENCOUNTER — Telehealth: Payer: Medicare Other

## 2020-12-22 ENCOUNTER — Ambulatory Visit (INDEPENDENT_AMBULATORY_CARE_PROVIDER_SITE_OTHER): Payer: Medicare Other | Admitting: Licensed Clinical Social Worker

## 2020-12-22 DIAGNOSIS — J441 Chronic obstructive pulmonary disease with (acute) exacerbation: Secondary | ICD-10-CM

## 2020-12-22 DIAGNOSIS — F411 Generalized anxiety disorder: Secondary | ICD-10-CM

## 2020-12-22 DIAGNOSIS — E1140 Type 2 diabetes mellitus with diabetic neuropathy, unspecified: Secondary | ICD-10-CM

## 2020-12-22 DIAGNOSIS — Z794 Long term (current) use of insulin: Secondary | ICD-10-CM | POA: Diagnosis not present

## 2020-12-22 DIAGNOSIS — I480 Paroxysmal atrial fibrillation: Secondary | ICD-10-CM

## 2020-12-22 DIAGNOSIS — I739 Peripheral vascular disease, unspecified: Secondary | ICD-10-CM

## 2020-12-22 DIAGNOSIS — I10 Essential (primary) hypertension: Secondary | ICD-10-CM

## 2020-12-22 NOTE — Patient Instructions (Signed)
Visit Information  PATIENT GOALS: Goals Addressed            This Visit's Progress   . Protect My Health and manage housing needs of client       Timeframe:  Short-Term Goal Priority:  Medium Progress: On Track Start Date:             12/22/20                Expected End Date:           03/23/21            Follow Up Date 01/27/21    Protect My Health (Patient)  Manage housing needs of client    Why is this important?    Screening tests can find diseases early when they are easier to treat.   Your doctor or nurse will talk with you about which tests are important for you.   Getting shots for common diseases like the flu and shingles will help prevent them.     Patient Coping Skills: Attends scheduled medical appointments No transport needs Completes ADLs daily  Patient deficits:   Mobility issues Housing needs  Patient Goals:   Patient will call RNCM or LCSW as needed in next 30 days for CCM support Patient will attend all scheduled client medical appointments in next 30 days Patient will communicate regularly with his sister, Aurea Graff, in next 30 days to discuss ongoing needs of client -  Follow Up Plan: LCSW to call client on 01/27/21       Kelton Pillar.Gino Garrabrant MSW, LCSW Licensed Clinical Social Worker [facility] Care Management [phone]

## 2020-12-22 NOTE — Chronic Care Management (AMB) (Signed)
Chronic Care Management    Clinical Social Work Note  12/22/2020 Name: Todd Zhang MRN: [id number] DOB: 07/09/1946  Todd Zhang is a 75 y.o. year old male who is a primary care patient of [name], FNP. The CCM team was consulted to assist the patient with chronic disease management and/or care coordination needs related to: [name] .   Engaged with patient by telephone for follow up visit in response to provider referral for social work chronic care management and care coordination services.   Consent to Services:  The patient was given information about Chronic Care Management services, agreed to services, and gave verbal consent prior to initiation of services.  Please see initial visit note for detailed documentation.   Patient agreed to services and consent obtained.   Assessment: Review of patient past medical history, allergies, medications, and health status, including review of relevant consultants reports was performed today as part of a comprehensive evaluation and provision of chronic care management and care coordination services.     SDOH (Social Determinants of Health) assessments and interventions performed:  SDOH Interventions   Flowsheet Row Most Recent Value  SDOH Interventions   Depression Interventions/Treatment  --  [informed of LCSW support and RNCM support]       Advanced Directives Status: See Vynca application for related entries.  CCM Care Plan  Allergies  Allergen Reactions  . Asa [Aspirin] Hives  . Penicillins Hives    Outpatient Encounter Medications as of 12/22/2020  Medication Sig  . alendronate (FOSAMAX) 70 MG tablet TAKE 1 TABLET BY MOUTH ONCE A WEEK. TAKE WITH A FULL GLASS OF WATER ON AN EMPTY STOMACH  . benazepril (LOTENSIN) 5 MG tablet Take 5 mg by mouth daily.  . cetirizine (ZYRTEC) 10 MG tablet Take 10 mg by mouth daily.   . fenofibrate (TRICOR) 145 MG tablet Take 1 tablet (145 mg total) by mouth daily.  [name]  gabapentin (NEURONTIN) 300 MG capsule Take 1 capsule (300 mg total) by mouth 4 (four) times daily. TAKE 1 CAPSULE BY MOUTH THREE TIMES DAILY FOR PAIN  . glucose blood (ACCU-CHEK AVIVA PLUS) test strip USE TO check blood glucose 4 times daily  . HYDROcodone-acetaminophen (NORCO/VICODIN) 5-325 MG tablet Take 1 tablet by mouth every 4 (four) hours as needed.  . insulin regular (HUMULIN R) 100 units/mL injection INJECT 10 UNITS INTO THE SKIN THREE TIMES DAILY BEFORE MEALS  . Insulin Syringe-Needle U-100 (GLOBAL INJECT EASE INSULIN SYR) 31G X 5/16" 0.3 ML MISC Use with insulin Dx E11.9  . metFORMIN (GLUCOPHAGE) 500 MG tablet TAKE 1 TABLET BY MOUTH TWICE DAILY  . metoprolol succinate (TOPROL-XL) 50 MG 24 hr tablet TAKE 1 TABLET BY MOUTH DAILY, TAKE WITH FOOD OR IMMEDIATELY FOLLOWING A MEAL  . Multiple Vitamin (MULTIVITAMIN) tablet Take 1 tablet by mouth daily.  . mupirocin ointment (BACTROBAN) 2 % APPLY TO LEFT GREAT TOE  . omeprazole (PRILOSEC) 20 MG capsule TAKE 1 CAPSULE BY MOUTH EVERY DAY  . pravastatin (PRAVACHOL) 40 MG tablet Take 1 tablet (40 mg total) by mouth daily.  . tamsulosin (FLOMAX) 0.4 MG CAPS capsule Take 1 capsule (0.4 mg total) by mouth daily.  [name] triamcinolone cream (KENALOG) 0.5 % APPLY TO THE AFFECTED AREA(S) THREE TIMES DAILY  . vitamin B-12 (CYANOCOBALAMIN) 500 MCG tablet Take 500 mcg by mouth daily.  [name] 20 MG TABS tablet Take 1 tablet (20 mg total) by mouth daily.   No facility-administered encounter medications on file as of 12/22/2020.  Patient Active Problem List   Diagnosis Date Noted  . Pure hypercholesterolemia 07/16/2020  . Peripheral artery disease (HCC)   . COPD exacerbation (HCC) 05/14/2019  . DDD (degenerative disc disease), cervical 11/06/2018  . Cervical spinal stenosis 11/06/2018  . Generalized anxiety disorder 11/29/2017  . Benign prostatic hyperplasia with incomplete bladder emptying 11/28/2017  . Gastroesophageal reflux disease without  esophagitis 09/07/2017  . Essential hypertension 09/01/2017  . Onychomycosis 09/01/2017  . Atrial fibrillation (HCC) 09/01/2017  . Type 2 diabetes mellitus with diabetic neuropathy, with long-term current use of insulin (HCC) 06/19/2014  . Back pain 06/19/2014  . Neck pain 06/19/2014    Conditions to be addressed/monitored: Monitor housing situation of client   Care Plan : LCSW Care plan  Updates made by Isaiah Blakes, LCSW since 12/22/2020 12:00 AM    Problem: Coping Skills (General Plan of Care)     Goal: Coping Skills Enhanced: manage housing needs of client   Start Date: 12/22/2020  Expected End Date: 03/23/2021  This Visit's Progress: On track  Priority: Medium  Note:   Current barriers:   . Patient in need of assistance with connecting to community resources for possible help with housing needs of client . Patient is unable to independently navigate community resource options without care coordination support . Mobility issues and pain issues  Clinical Goals:   LCSW to call client in next 30 days to discuss housing needs and housing situation of client Client to call RNCM or LCSW as needed in next 30 days for CCM program support  Clinical Interventions:  . Collaboration with [name], FNP regarding development and update of comprehensive plan of care as evidenced by provider attestation and co-signature . Assessment of needs, barriers of client  . Talked with client about housing needs of client . Talked with client about mobility of client . Talked with client about skin issues of client . Talked with client about his appointment with Dr. Ulice Brilliant on 12/23/20 . Talked with client about wrapping he uses for his foot . Talked with client about transport needs of client . Talked with client about his new residence at [facility] in [place], [place] . Talked with client about ambulation of client ( can use a cane to help him walk) . Talked with client  about family support ( his brother also lives at [facility]) . Talked with client about medication procurement of client . Talked with client about vision of client . Talked with client about pain issues  Patient Coping Skills: Attends scheduled medical appointments No transport needs Completes ADLs daily  Patient deficits:   Mobility issues Housing needs  Patient Goals:   Patient will call RNCM or LCSW as needed in next 30 days for CCM support Patient will attend all scheduled client medical appointments in next 30 days Patient will communicate regularly with his sister, Aurea Graff, in next 30 days to discuss ongoing needs of client -  Follow Up Plan: LCSW to call client on 01/27/21     Kelton Pillar.Berdena Cisek MSW, LCSW Licensed Clinical Social Worker [facility] Care Management [phone]

## 2020-12-30 ENCOUNTER — Other Ambulatory Visit: Payer: Self-pay | Admitting: Nurse Practitioner

## 2021-01-02 ENCOUNTER — Telehealth: Payer: Medicare Other | Admitting: *Deleted

## 2021-01-07 ENCOUNTER — Other Ambulatory Visit: Payer: Self-pay | Admitting: Family Medicine

## 2021-01-13 ENCOUNTER — Other Ambulatory Visit: Payer: Self-pay | Admitting: Nurse Practitioner

## 2021-01-13 DIAGNOSIS — Z794 Long term (current) use of insulin: Secondary | ICD-10-CM

## 2021-01-13 DIAGNOSIS — E1140 Type 2 diabetes mellitus with diabetic neuropathy, unspecified: Secondary | ICD-10-CM

## 2021-01-15 ENCOUNTER — Ambulatory Visit: Payer: Self-pay | Admitting: Nurse Practitioner

## 2021-01-16 ENCOUNTER — Encounter: Payer: Self-pay | Admitting: Nurse Practitioner

## 2021-01-20 ENCOUNTER — Telehealth: Payer: Self-pay | Admitting: *Deleted

## 2021-01-20 ENCOUNTER — Telehealth: Payer: Medicare Other | Admitting: *Deleted

## 2021-01-20 NOTE — Telephone Encounter (Signed)
  Chronic Care Management   Outreach Note  01/20/2021 Name: Todd Zhang MRN: [id number] DOB: [birth year]  Referred by: [name], FNP Reason for referral : Chronic Care Management (Unsuccessful telephone follow-up)   A second unsuccessful telephone outreach was attempted today. The patient was referred to the case management team for assistance with care management and care coordination.   Follow Up Plan:  Forwarding to [facility] Care Guides for outreach and rescheduling of telephone visit  [name], BSN, RN-BC Embedded Chronic Care Manager [name] Care Management Direct Dial: [phone]

## 2021-01-27 ENCOUNTER — Other Ambulatory Visit: Payer: Self-pay | Admitting: Nurse Practitioner

## 2021-01-27 ENCOUNTER — Ambulatory Visit (INDEPENDENT_AMBULATORY_CARE_PROVIDER_SITE_OTHER): Payer: Medicare Other | Admitting: Licensed Clinical Social Worker

## 2021-01-27 ENCOUNTER — Telehealth: Payer: Self-pay

## 2021-01-27 DIAGNOSIS — I10 Essential (primary) hypertension: Secondary | ICD-10-CM

## 2021-01-27 DIAGNOSIS — I480 Paroxysmal atrial fibrillation: Secondary | ICD-10-CM

## 2021-01-27 DIAGNOSIS — J441 Chronic obstructive pulmonary disease with (acute) exacerbation: Secondary | ICD-10-CM

## 2021-01-27 DIAGNOSIS — I739 Peripheral vascular disease, unspecified: Secondary | ICD-10-CM

## 2021-01-27 DIAGNOSIS — F411 Generalized anxiety disorder: Secondary | ICD-10-CM

## 2021-01-27 DIAGNOSIS — E1140 Type 2 diabetes mellitus with diabetic neuropathy, unspecified: Secondary | ICD-10-CM

## 2021-01-27 DIAGNOSIS — K219 Gastro-esophageal reflux disease without esophagitis: Secondary | ICD-10-CM

## 2021-01-27 DIAGNOSIS — Z794 Long term (current) use of insulin: Secondary | ICD-10-CM

## 2021-01-27 NOTE — Patient Instructions (Signed)
Visit Information  PATIENT GOALS: Goals Addressed            This Visit's Progress   . Protect My Health and manage housing needs of client       Timeframe:  Short-Term Goal Priority:  Medium Progress: On Track Start Date:             12/22/20                Expected End Date:           03/23/21            Follow Up Date 03/10/21    Protect My Health (Patient)  Manage housing needs of client    Why is this important?    Screening tests can find diseases early when they are easier to treat.   Your doctor or nurse will talk with you about which tests are important for you.   Getting shots for common diseases like the flu and shingles will help prevent them.     Patient Coping Skills: Attends scheduled medical appointments No transport needs Completes ADLs daily  Patient deficits:   Mobility issues Housing needs  Patient Goals:   Patient will call RNCM or LCSW as needed in next 30 days for CCM support Patient will attend all scheduled client medical appointments in next 30 days Patient will communicate regularly with his sister, Aurea Graff, in next 30 days to discuss ongoing needs of client -  Follow Up Plan: LCSW to call client on 03/10/21       Kelton Pillar.Verginia Toohey MSW, LCSW Licensed Clinical Social Worker [facility] Care Management [phone]

## 2021-01-27 NOTE — Chronic Care Management (AMB) (Signed)
Chronic Care Management    Clinical Social Work Note  01/27/2021 Name: Todd Zhang MRN: [id number] DOB: Feb 14, 1946  Todd Zhang is a 75 y.o. year old male who is a primary care patient of [name], FNP. The CCM team was consulted to assist the patient with chronic disease management and/or care coordination needs related to: [name] .   Engaged with patient by telephone for follow up visit in response to provider referral for social work chronic care management and care coordination services.   Consent to Services:  The patient was given information about Chronic Care Management services, agreed to services, and gave verbal consent prior to initiation of services.  Please see initial visit note for detailed documentation.   Patient agreed to services and consent obtained.   Assessment: Review of patient past medical history, allergies, medications, and health status, including review of relevant consultants reports was performed today as part of a comprehensive evaluation and provision of chronic care management and care coordination services.     SDOH (Social Determinants of Health) assessments and interventions performed:  SDOH Interventions   Flowsheet Row Most Recent Value  SDOH Interventions   Depression Interventions/Treatment  --  [informed client or LCSW support and of RNCM support]       Advanced Directives Status: See Vynca application for related entries.  CCM Care Plan  Allergies  Allergen Reactions  . Asa [Aspirin] Hives  . Penicillins Hives    Outpatient Encounter Medications as of 01/27/2021  Medication Sig  . alendronate (FOSAMAX) 70 MG tablet TAKE 1 TABLET BY MOUTH ONCE A WEEK. TAKE WITH A FULL GLASS OF WATER ON AN EMPTY STOMACH  . benazepril (LOTENSIN) 5 MG tablet Take 5 mg by mouth daily.  . cetirizine (ZYRTEC) 10 MG tablet Take 10 mg by mouth daily.   . fenofibrate (TRICOR) 145 MG tablet Take 1 tablet (145 mg total) by mouth  daily.  [name] gabapentin (NEURONTIN) 300 MG capsule Take 1 capsule (300 mg total) by mouth 4 (four) times daily. TAKE 1 CAPSULE BY MOUTH THREE TIMES DAILY FOR PAIN  . glucose blood (ACCU-CHEK AVIVA PLUS) test strip USE TO check blood glucose 4 times daily  . HYDROcodone-acetaminophen (NORCO/VICODIN) 5-325 MG tablet Take 1 tablet by mouth every 4 (four) hours as needed.  . insulin regular (HUMULIN R) 100 units/mL injection INJECT 10 UNITS INTO THE SKIN THREE TIMES DAILY BEFORE MEALS  . Insulin Syringe-Needle U-100 (GLOBAL INJECT EASE INSULIN SYR) 31G X 5/16" 0.3 ML MISC USE WITH INSULIN EVERY DAY Dx E11.40  . metFORMIN (GLUCOPHAGE) 500 MG tablet TAKE 1 TABLET BY MOUTH TWICE DAILY  . metoprolol succinate (TOPROL-XL) 50 MG 24 hr tablet TAKE 1 TABLET BY MOUTH DAILY, TAKE WITH FOOD OR IMMEDIATELY FOLLOWING A MEAL  . Multiple Vitamin (MULTIVITAMIN) tablet Take 1 tablet by mouth daily.  . mupirocin ointment (BACTROBAN) 2 % APPLY TO LEFT GREAT TOE  . omeprazole (PRILOSEC) 20 MG capsule TAKE 1 CAPSULE BY MOUTH EVERY DAY  . pravastatin (PRAVACHOL) 40 MG tablet Take 1 tablet (40 mg total) by mouth daily.  . tamsulosin (FLOMAX) 0.4 MG CAPS capsule Take 1 capsule (0.4 mg total) by mouth daily.  [name] triamcinolone cream (KENALOG) 0.5 % APPLY TO THE AFFECTED AREA(S) THREE TIMES DAILY  . vitamin B-12 (CYANOCOBALAMIN) 500 MCG tablet Take 500 mcg by mouth daily.  [name] 20 MG TABS tablet Take 1 tablet (20 mg total) by mouth daily.   No facility-administered encounter medications on file  as of 01/27/2021.    Patient Active Problem List   Diagnosis Date Noted  . Pure hypercholesterolemia 07/16/2020  . Peripheral artery disease (HCC)   . COPD exacerbation (HCC) 05/14/2019  . DDD (degenerative disc disease), cervical 11/06/2018  . Cervical spinal stenosis 11/06/2018  . Generalized anxiety disorder 11/29/2017  . Benign prostatic hyperplasia with incomplete bladder emptying 11/28/2017  . Gastroesophageal reflux  disease without esophagitis 09/07/2017  . Essential hypertension 09/01/2017  . Onychomycosis 09/01/2017  . Atrial fibrillation (HCC) 09/01/2017  . Type 2 diabetes mellitus with diabetic neuropathy, with long-term current use of insulin (HCC) 06/19/2014  . Back pain 06/19/2014  . Neck pain 06/19/2014    Conditions to be addressed/monitored:Monitor mobility of client; monitor pain issues of client   Care Plan : LCSW Care plan  Updates made by Isaiah Blakes, LCSW since 01/27/2021 12:00 AM    Problem: Coping Skills (General Plan of Care)     Goal: Coping Skills Enhanced: manage housing needs of client   Start Date: 12/22/2020  Expected End Date: 03/23/2021  This Visit's Progress: On track  Recent Progress: On track  Priority: Medium  Note:   Current barriers:   . Patient in need of assistance with connecting to community resources for possible help with housing needs of client . Patient is unable to independently navigate community resource options without care coordination support . Mobility issues and pain issues  Clinical Goals:   LCSW to call client in next 30 days to discuss housing needs and housing situation of client Client to call RNCM or LCSW as needed in next 30 days for CCM program support  Clinical Interventions:  . Collaboration with [name], FNP regarding development and update of comprehensive plan of care as evidenced by provider attestation and co-signature . Assessment of needs, barriers of client  . Talked with client about housing needs of client . Talked with client about mobility of client . Talked with client about skin issues of client . Talked with client about transport needs of client . Talked with client about his new residence at [employer] in [place], [place] . Talked with client about ambulation of client ( can use a cane to help him walk) . Talked with client about family support ( his brother also lives at [facility]) . Talked with client about medication procurement of client . Talked with client about vision of client . Talked with client about pain issues (he said he has pain in both legs, from the waist down) . Talked with client about sleeping issues of client  Patient Coping Skills: Attends scheduled medical appointments No transport needs Completes ADLs daily  Patient deficits:   Mobility issues Housing needs  Patient Goals:   Patient will call RNCM or LCSW as needed in next 30 days for CCM support Patient will attend all scheduled client medical appointments in next 30 days Patient will communicate regularly with his sister, Aurea Graff, in next 30 days to discuss ongoing needs of client -  Follow Up Plan: LCSW to call client on 03/10/21     Kelton Pillar.Matthew Pais MSW, LCSW Licensed Clinical Social Worker [facility] Care Management [phone]

## 2021-01-27 NOTE — Chronic Care Management (AMB) (Signed)
  Care Management   Note  01/27/2021 Name: AIRON SAHNI MRN: [id number] DOB: 12-Jun-1946  JACIER GLADU is a 75 y.o. year old male who is a primary care patient of [name], FNP and is actively engaged with the care management team. I reached out to [name] by phone today to assist with re-scheduling a follow up visit with the RN Case Manager  Follow up plan: Unsuccessful telephone outreach attempt made. The care management team will reach out to the patient again over the next 7 days.  If patient returns call to provider office, please advise to call Embedded Care Management Care Guide Penne Lash  at [phone]   Penne Lash, RMA Care Guide, Embedded Care Coordination [facility]  [place], [place] [address] Direct Dial: [phone] [email or web address]: [email or web address]

## 2021-01-28 ENCOUNTER — Telehealth: Payer: Self-pay | Admitting: Nurse Practitioner

## 2021-01-28 ENCOUNTER — Other Ambulatory Visit: Payer: Self-pay

## 2021-01-28 ENCOUNTER — Encounter: Payer: Self-pay | Admitting: Nurse Practitioner

## 2021-01-28 ENCOUNTER — Ambulatory Visit (INDEPENDENT_AMBULATORY_CARE_PROVIDER_SITE_OTHER): Payer: Medicare Other | Admitting: Nurse Practitioner

## 2021-01-28 VITALS — BP 119/67 | HR 89 | Temp 98.0°F | Resp 20 | Ht 72.0 in | Wt 229.0 lb

## 2021-01-28 DIAGNOSIS — E7800 Pure hypercholesterolemia, unspecified: Secondary | ICD-10-CM | POA: Diagnosis not present

## 2021-01-28 DIAGNOSIS — M545 Low back pain, unspecified: Secondary | ICD-10-CM

## 2021-01-28 DIAGNOSIS — Z794 Long term (current) use of insulin: Secondary | ICD-10-CM

## 2021-01-28 DIAGNOSIS — G8929 Other chronic pain: Secondary | ICD-10-CM

## 2021-01-28 DIAGNOSIS — R3914 Feeling of incomplete bladder emptying: Secondary | ICD-10-CM

## 2021-01-28 DIAGNOSIS — I482 Chronic atrial fibrillation, unspecified: Secondary | ICD-10-CM

## 2021-01-28 DIAGNOSIS — J441 Chronic obstructive pulmonary disease with (acute) exacerbation: Secondary | ICD-10-CM | POA: Diagnosis not present

## 2021-01-28 DIAGNOSIS — N401 Enlarged prostate with lower urinary tract symptoms: Secondary | ICD-10-CM

## 2021-01-28 DIAGNOSIS — E1140 Type 2 diabetes mellitus with diabetic neuropathy, unspecified: Secondary | ICD-10-CM | POA: Diagnosis not present

## 2021-01-28 DIAGNOSIS — K219 Gastro-esophageal reflux disease without esophagitis: Secondary | ICD-10-CM

## 2021-01-28 DIAGNOSIS — I10 Essential (primary) hypertension: Secondary | ICD-10-CM | POA: Diagnosis not present

## 2021-01-28 DIAGNOSIS — F411 Generalized anxiety disorder: Secondary | ICD-10-CM

## 2021-01-28 DIAGNOSIS — M5030 Other cervical disc degeneration, unspecified cervical region: Secondary | ICD-10-CM

## 2021-01-28 LAB — BAYER DCA HB A1C WAIVED: HB A1C (BAYER DCA - WAIVED): 7.2 % — ABNORMAL HIGH (ref <7.0)

## 2021-01-28 MED ORDER — GABAPENTIN 300 MG PO CAPS: 300.0000 mg | ORAL_CAPSULE | Freq: Four times a day (QID) | ORAL | 5 refills | Status: DC

## 2021-01-28 MED ORDER — FENOFIBRATE 145 MG PO TABS: 145.0000 mg | ORAL_TABLET | Freq: Every day | ORAL | 1 refills | Status: DC

## 2021-01-28 MED ORDER — PRAVASTATIN SODIUM 40 MG PO TABS: 40.0000 mg | ORAL_TABLET | Freq: Every day | ORAL | 1 refills | Status: DC

## 2021-01-28 MED ORDER — XARELTO 20 MG PO TABS: 20.0000 mg | ORAL_TABLET | Freq: Every day | ORAL | 5 refills | Status: DC

## 2021-01-28 MED ORDER — INSULIN REGULAR HUMAN 100 UNIT/ML IJ SOLN
INTRAMUSCULAR | 5 refills | Status: DC
Start: 2021-01-28 — End: 2021-07-20

## 2021-01-28 MED ORDER — METFORMIN HCL 500 MG PO TABS
1.0000 | ORAL_TABLET | Freq: Two times a day (BID) | ORAL | 1 refills | Status: DC
Start: 2021-01-28 — End: 2021-07-20

## 2021-01-28 MED ORDER — OMEPRAZOLE 20 MG PO CPDR: 20.0000 mg | DELAYED_RELEASE_CAPSULE | Freq: Every day | ORAL | 1 refills | Status: DC

## 2021-01-28 MED ORDER — ALENDRONATE SODIUM 70 MG PO TABS: ORAL_TABLET | ORAL | 1 refills | Status: DC

## 2021-01-28 MED ORDER — TAMSULOSIN HCL 0.4 MG PO CAPS: 0.4000 mg | ORAL_CAPSULE | Freq: Every day | ORAL | 1 refills | Status: DC

## 2021-01-28 MED ORDER — METOPROLOL SUCCINATE ER 50 MG PO TB24
ORAL_TABLET | ORAL | 1 refills | Status: DC
Start: 2021-01-28 — End: 2021-06-19

## 2021-01-28 NOTE — Patient Instructions (Signed)
Diabetes Mellitus and Foot Care Foot care is an important part of your health, especially when you have diabetes. Diabetes may cause you to have problems because of poor blood flow (circulation) to your feet and legs, which can cause your skin to:  Become thinner and drier.  Break more easily.  Heal more slowly.  Peel and crack. You may also have nerve damage (neuropathy) in your legs and feet, causing decreased feeling in them. This means that you may not notice minor injuries to your feet that could lead to more serious problems. Noticing and addressing any potential problems early is the best way to prevent future foot problems. How to care for your feet Foot hygiene  Wash your feet daily with warm water and mild soap. Do not use hot water. Then, pat your feet and the areas between your toes until they are completely dry. Do not soak your feet as this can dry your skin.  Trim your toenails straight across. Do not dig under them or around the cuticle. File the edges of your nails with an emery board or nail file.  Apply a moisturizing lotion or petroleum jelly to the skin on your feet and to dry, brittle toenails. Use lotion that does not contain alcohol and is unscented. Do not apply lotion between your toes.   Shoes and socks  Wear clean socks or stockings every day. Make sure they are not too tight. Do not wear knee-high stockings since they may decrease blood flow to your legs.  Wear shoes that fit properly and have enough cushioning. Always look in your shoes before you put them on to be sure there are no objects inside.  To break in new shoes, wear them for just a few hours a day. This prevents injuries on your feet. Wounds, scrapes, corns, and calluses  Check your feet daily for blisters, cuts, bruises, sores, and redness. If you cannot see the bottom of your feet, use a mirror or ask someone for help.  Do not cut corns or calluses or try to remove them with medicine.  If you  find a minor scrape, cut, or break in the skin on your feet, keep it and the skin around it clean and dry. You may clean these areas with mild soap and water. Do not clean the area with peroxide, alcohol, or iodine.  If you have a wound, scrape, corn, or callus on your foot, look at it several times a day to make sure it is healing and not infected. Check for: ? Redness, swelling, or pain. ? Fluid or blood. ? Warmth. ? Pus or a bad smell.   General tips  Do not cross your legs. This may decrease blood flow to your feet.  Do not use heating pads or hot water bottles on your feet. They may burn your skin. If you have lost feeling in your feet or legs, you may not know this is happening until it is too late.  Protect your feet from hot and cold by wearing shoes, such as at the beach or on hot pavement.  Schedule a complete foot exam at least once a year (annually) or more often if you have foot problems. Report any cuts, sores, or bruises to your health care provider immediately. Where to find more information  American Diabetes Association: www.diabetes.org  Association of Diabetes Care & Education Specialists: www.diabeteseducator.org Contact a health care provider if:  You have a medical condition that increases your risk of infection and   you have any cuts, sores, or bruises on your feet.  You have an injury that is not healing.  You have redness on your legs or feet.  You feel burning or tingling in your legs or feet.  You have pain or cramps in your legs and feet.  Your legs or feet are numb.  Your feet always feel cold.  You have pain around any toenails. Get help right away if:  You have a wound, scrape, corn, or callus on your foot and: ? You have pain, swelling, or redness that gets worse. ? You have fluid or blood coming from the wound, scrape, corn, or callus. ? Your wound, scrape, corn, or callus feels warm to the touch. ? You have pus or a bad smell coming from  the wound, scrape, corn, or callus. ? You have a fever. ? You have a red line going up your leg. Summary  Check your feet every day for blisters, cuts, bruises, sores, and redness.  Apply a moisturizing lotion or petroleum jelly to the skin on your feet and to dry, brittle toenails.  Wear shoes that fit properly and have enough cushioning.  If you have foot problems, report any cuts, sores, or bruises to your health care provider immediately.  Schedule a complete foot exam at least once a year (annually) or more often if you have foot problems. This information is not intended to replace advice given to you by your health care provider. Make sure you discuss any questions you have with your health care provider. Document Revised: 03/20/2020 Document Reviewed: 03/20/2020 Elsevier Patient Education  2021 Elsevier Inc.  

## 2021-01-28 NOTE — Telephone Encounter (Signed)
Called and spoke with Irving Burton at pharmacy. Clarified gabapentin rx to 4 times daily

## 2021-01-28 NOTE — Progress Notes (Signed)
Subjective:    Patient ID: Todd Zhang, male    DOB: August 18, 1946, 75 y.o.   MRN: [id number]   Chief Complaint: Medical Management of Chronic Issues    HPI:  1. Type 2 diabetes mellitus with diabetic neuropathy, with long-term current use of insulin (HCC) Check CBG about 3x a day, ~90-130s. Which check more frequently when CBG is high. Attempts to watch diet, limits carbs. Takes medication as prescribed. A1C 7.2 today.  Lab Results  Component Value Date   HGBA1C 7.1 (H) 10/17/2020     2. Primary hypertension Does not check BP at home, does limit salt. Denies any new of worsening SOB. Complaints chest pain on and off x 2 years. Generalized weakness do to toe pain, sees Dr Irving Shows regarding foot pain.  BP Readings from Last 3 Encounters:  10/17/20 124/65  07/16/20 119/64  05/08/20 107/68     3. Pure hypercholesterolemia Takes medication as prescribed. Does not watch fried or fatty foods.  Lab Results  Component Value Date   CHOL 223 (H) 10/17/2020   HDL 41 10/17/2020   LDLCALC 136 (H) 10/17/2020   TRIG 257 (H) 10/17/2020   CHOLHDL 5.4 (H) 10/17/2020     4. COPD exacerbation ([place]) Takes medication as prescribed. States he sits around often and does not exercise, SOB has been ongoing. States he has trouble wearing mask.   5. Gastroesophageal reflux disease without esophagitis No complaints and takes medication as prescribed.   6. Generalized anxiety disorder States no new or worsening symptoms.  GAD 7 : Generalized Anxiety Score 01/28/2021 12/22/2020 04/10/2020 01/10/2020  Nervous, Anxious, on Edge '1 1 1 1  ' Control/stop worrying '1 1 1 1  ' Worry too much - different things 0 0 1 1  Trouble relaxing 0 0 0 0  Restless 0 0 0 0  Easily annoyed or irritable 1 1 0 0  Afraid - awful might happen 0 0 0 0  Total GAD 7 Score '3 3 3 3  ' Anxiety Difficulty Somewhat difficult Somewhat difficult Not difficult at all Somewhat difficult        Outpatient Encounter Medications as  of 01/28/2021  Medication Sig  . alendronate (FOSAMAX) 70 MG tablet TAKE 1 TABLET BY MOUTH ONCE A WEEK. TAKE WITH A FULL GLASS OF WATER ON AN EMPTY STOMACH  . benazepril (LOTENSIN) 5 MG tablet Take 5 mg by mouth daily.  . cetirizine (ZYRTEC) 10 MG tablet Take 10 mg by mouth daily.   . fenofibrate (TRICOR) 145 MG tablet Take 1 tablet (145 mg total) by mouth daily.  [name] gabapentin (NEURONTIN) 300 MG capsule Take 1 capsule (300 mg total) by mouth 4 (four) times daily. TAKE 1 CAPSULE BY MOUTH THREE TIMES DAILY FOR PAIN  . glucose blood (ACCU-CHEK AVIVA PLUS) test strip USE TO check blood glucose 4 times daily  . HYDROcodone-acetaminophen (NORCO/VICODIN) 5-325 MG tablet Take 1 tablet by mouth every 4 (four) hours as needed.  . insulin regular (HUMULIN R) 100 units/mL injection INJECT 10 UNITS INTO THE SKIN THREE TIMES DAILY BEFORE MEALS  . Insulin Syringe-Needle U-100 (GLOBAL INJECT EASE INSULIN SYR) 31G X 5/16" 0.3 ML MISC USE WITH INSULIN EVERY DAY Dx E11.40  . metFORMIN (GLUCOPHAGE) 500 MG tablet TAKE 1 TABLET BY MOUTH TWICE DAILY  . metoprolol succinate (TOPROL-XL) 50 MG 24 hr tablet TAKE 1 TABLET BY MOUTH DAILY, TAKE WITH FOOD OR IMMEDIATELY FOLLOWING A MEAL  . Multiple Vitamin (MULTIVITAMIN) tablet Take 1 tablet by mouth daily.  [name]  mupirocin ointment (BACTROBAN) 2 % APPLY TO LEFT GREAT TOE  . omeprazole (PRILOSEC) 20 MG capsule TAKE 1 CAPSULE BY MOUTH EVERY DAY  . pravastatin (PRAVACHOL) 40 MG tablet Take 1 tablet (40 mg total) by mouth daily.  . tamsulosin (FLOMAX) 0.4 MG CAPS capsule Take 1 capsule (0.4 mg total) by mouth daily.  [name] triamcinolone cream (KENALOG) 0.5 % APPLY TO THE AFFECTED AREA(S) THREE TIMES DAILY  . vitamin B-12 (CYANOCOBALAMIN) 500 MCG tablet Take 500 mcg by mouth daily.  Alveda Reasons 20 MG TABS tablet Take 1 tablet (20 mg total) by mouth daily.   No facility-administered encounter medications on file as of 01/28/2021.    Past Surgical History:  Procedure Laterality Date  .  AMPUTATION TOE    . KNEE SURGERY Left   . NOSE SURGERY      Family History  Problem Relation Age of Onset  . Stroke Mother   . Heart disease Mother        CABG  . Cancer Father     New complaints: No new complaints or concerns.   Social history: Lives at home alone in an apartment in [place], [place], reads bible. Visits with brother  Controlled substance contract: signed 01/28/21    Review of Systems  Constitutional: Negative.   HENT: Negative.  Negative for trouble swallowing.   Eyes: Negative.   Respiratory: Positive for shortness of breath. Negative for choking.   Cardiovascular: Positive for chest pain.  Gastrointestinal: Negative.   Endocrine: Negative.   Genitourinary: Negative.   Musculoskeletal: Positive for arthralgias.  Allergic/Immunologic: Negative.   Neurological: Negative.  Negative for light-headedness and headaches.  Hematological: Negative.   Psychiatric/Behavioral: Negative.   All other systems reviewed and are negative.      Objective:   Physical Exam Vitals and nursing note reviewed.  Constitutional:      Appearance: Normal appearance. He is obese.  HENT:     Head: Normocephalic and atraumatic.     Right Ear: Tympanic membrane normal.     Left Ear: Tympanic membrane normal.     Nose: Nose normal.     Mouth/Throat:     Mouth: Mucous membranes are moist.     Pharynx: Oropharynx is clear.  Eyes:     Pupils: Pupils are equal, round, and reactive to light.  Cardiovascular:     Rate and Rhythm: Normal rate and regular rhythm.     Pulses: Normal pulses.     Heart sounds: Normal heart sounds.  Pulmonary:     Effort: Prolonged expiration present. No respiratory distress.     Breath sounds: Normal breath sounds and air entry.  Chest:  Breasts:     Right: Normal.     Left: Normal.    Abdominal:     General: Bowel sounds are normal.     Palpations: Abdomen is soft. There is no hepatomegaly or splenomegaly.     Tenderness: There  is generalized abdominal tenderness.  Musculoskeletal:        General: No tenderness. Normal range of motion.     Cervical back: Normal range of motion. No erythema or signs of trauma. Normal range of motion.     Right lower leg: 1+ Edema present.     Left lower leg: 1+ Edema present.  Feet:     Right foot:     Skin integrity: Skin breakdown present.     Left foot:     Skin integrity: Skin integrity normal.  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 Follows podiatry for current left great toe wound.  Skin:    General: Skin is dry.     Capillary Refill: Capillary refill takes less than 2 seconds.  Neurological:     General: No focal deficit present.     Mental Status: He is alert and oriented to person, place, and time. Mental status is at baseline.  Psychiatric:        Mood and Affect: Mood normal.        Behavior: Behavior normal. Behavior is cooperative.        Thought Content: Thought content normal.        Judgment: Judgment normal.    BP 119/67   Pulse 89   Temp 98 F (36.7 C) (Temporal)   Resp 20   Ht 6' (1.829 m)   Wt 229 lb (103.9 kg)   SpO2 94%   BMI 31.06 kg/m       Assessment & Plan:  TIONNE DAYHOFF comes in today with chief complaint of Medical Management of Chronic Issues   Diagnosis and orders addressed:  1. Type 2 diabetes mellitus with diabetic neuropathy, with long-term current use of insulin (HCC) Check CBG regularly and take medication as prescribed. Avoid foods that are high in carbs and sugar.   - Bayer DCA Hb A1c Waived - Microalbumin / creatinine urine ratio  2. Primary hypertension Take blood pressure regularly and follow a heart healthy diet. Limit salt intake. Take medication as prescribed.   - CBC with Differential/Platelet - CMP14+EGFR  3. Pure hypercholesterolemia Take medication as prescribed. Avoid foods that are high in fat and cholesterol.   - Lipid panel  4. COPD exacerbation ([place]) Use  medication as prescribed. Report any new or worsening symptoms.   5. Gastroesophageal reflux disease without esophagitis Take medication as prescribed. Avoid trigger foods.   6. Generalized anxiety disorder Take medication as prescribed. Report any new or worsening symptoms.   Orders Placed This Encounter  Procedures  . Bayer DCA Hb A1c Waived  . CBC with Differential/Platelet  . CMP14+EGFR  . Lipid panel  . Microalbumin / creatinine urine ratio   Meds ordered this encounter  Medications  . tamsulosin (FLOMAX) 0.4 MG CAPS capsule    Sig: Take 1 capsule (0.4 mg total) by mouth daily.    Dispense:  90 capsule    Refill:  1    Order Specific Question:   Supervising Provider    Answer:   Caryl Pina A [id number]  . XARELTO 20 MG TABS tablet    Sig: Take 1 tablet (20 mg total) by mouth daily.    Dispense:  30 tablet    Refill:  5    Order Specific Question:   Supervising Provider    Answer:   Caryl Pina A [id number]  . gabapentin (NEURONTIN) 300 MG capsule    Sig: Take 1 capsule (300 mg total) by mouth 4 (four) times daily. TAKE 1 CAPSULE BY MOUTH THREE TIMES DAILY FOR PAIN    Dispense:  120 capsule    Refill:  5    This prescription was filled on 10/30/2019. Any refills authorized will be placed on file.    Order Specific Question:   Supervising Provider    Answer:   Caryl Pina A [id number]  . metoprolol succinate (TOPROL-XL) 50 MG 24 hr tablet    Sig: TAKE 1 TABLET BY MOUTH DAILY, TAKE WITH FOOD OR IMMEDIATELY FOLLOWING A  MEAL    Dispense:  90 tablet    Refill:  1    Order Specific Question:   Supervising Provider    Answer:   Caryl Pina A [id number]  . omeprazole (PRILOSEC) 20 MG capsule    Sig: Take 1 capsule (20 mg total) by mouth daily.    Dispense:  90 capsule    Refill:  1    Order Specific Question:   Supervising Provider    Answer:   Caryl Pina A [id number]  . pravastatin (PRAVACHOL) 40 MG tablet    Sig: Take 1 tablet (40 mg total) by  mouth daily.    Dispense:  90 tablet    Refill:  1    Order Specific Question:   Supervising Provider    Answer:   Caryl Pina A [id number]  . fenofibrate (TRICOR) 145 MG tablet    Sig: Take 1 tablet (145 mg total) by mouth daily.    Dispense:  90 tablet    Refill:  1    Order Specific Question:   Supervising Provider    Answer:   Caryl Pina A [id number]  . metFORMIN (GLUCOPHAGE) 500 MG tablet    Sig: Take 1 tablet (500 mg total) by mouth 2 (two) times daily.    Dispense:  180 tablet    Refill:  1    This prescription was filled on 11/13/2020. Any refills authorized will be placed on file.    Order Specific Question:   Supervising Provider    Answer:   Caryl Pina A [id number]  . insulin regular (HUMULIN R) 100 units/mL injection    Sig: INJECT 10 UNITS INTO THE SKIN THREE TIMES DAILY BEFORE MEALS    Dispense:  10 mL    Refill:  5    Order Specific Question:   Supervising Provider    Answer:   Caryl Pina A [id number]  . alendronate (FOSAMAX) 70 MG tablet    Sig: TAKE 1 TABLET BY MOUTH ONCE A WEEK. TAKE WITH A FULL GLASS OF WATER ON AN EMPTY STOMACH    Dispense:  12 tablet    Refill:  1    Order Specific Question:   Supervising Provider    Answer:   Caryl Pina A [id number]    Labs pending Health Maintenance reviewed Diet and exercise encouraged  Follow up plan: Follow up in 3 months.    [name] [name], FNP

## 2021-01-28 NOTE — Telephone Encounter (Signed)
Pharmacy calling for clarification on a prescription

## 2021-01-29 LAB — CMP14+EGFR
ALT: 18 IU/L (ref 0–44)
AST: 16 IU/L (ref 0–40)
Albumin/Globulin Ratio: 2.4 — ABNORMAL HIGH (ref 1.2–2.2)
Albumin: 4.6 g/dL (ref 3.7–4.7)
Alkaline Phosphatase: 37 IU/L — ABNORMAL LOW (ref 44–121)
BUN/Creatinine Ratio: 17 (ref 10–24)
BUN: 30 mg/dL — ABNORMAL HIGH (ref 8–27)
Bilirubin Total: 0.3 mg/dL (ref 0.0–1.2)
CO2: 22 mmol/L (ref 20–29)
Calcium: 9.6 mg/dL (ref 8.6–10.2)
Chloride: 101 mmol/L (ref 96–106)
Creatinine, Ser: 1.73 mg/dL — ABNORMAL HIGH (ref 0.76–1.27)
Globulin, Total: 1.9 g/dL (ref 1.5–4.5)
Glucose: 196 mg/dL — ABNORMAL HIGH (ref 65–99)
Potassium: 5.2 mmol/L (ref 3.5–5.2)
Sodium: 137 mmol/L (ref 134–144)
Total Protein: 6.5 g/dL (ref 6.0–8.5)
eGFR: 41 mL/min/{1.73_m2} — ABNORMAL LOW (ref >59)

## 2021-01-29 LAB — CBC WITH DIFFERENTIAL/PLATELET
Basophils Absolute: 0 10*3/uL (ref 0.0–0.2)
Basos: 1 %
EOS (ABSOLUTE): 0.1 10*3/uL (ref 0.0–0.4)
Eos: 1 %
Hematocrit: 40.3 % (ref 37.5–51.0)
Hemoglobin: 13.5 g/dL (ref 13.0–17.7)
Immature Grans (Abs): 0 10*3/uL (ref 0.0–0.1)
Immature Granulocytes: 0 %
Lymphocytes Absolute: 2.6 10*3/uL (ref 0.7–3.1)
Lymphs: 31 %
MCH: 30.3 pg (ref 26.6–33.0)
MCHC: 33.5 g/dL (ref 31.5–35.7)
MCV: 90 fL (ref 79–97)
Monocytes Absolute: 0.6 10*3/uL (ref 0.1–0.9)
Monocytes: 7 %
Neutrophils Absolute: 5 10*3/uL (ref 1.4–7.0)
Neutrophils: 60 %
Platelets: 185 10*3/uL (ref 150–450)
RBC: 4.46 x10E6/uL (ref 4.14–5.80)
RDW: 12.7 % (ref 11.6–15.4)
WBC: 8.3 10*3/uL (ref 3.4–10.8)

## 2021-01-29 LAB — LIPID PANEL
Chol/HDL Ratio: 4.8 ratio (ref 0.0–5.0)
Cholesterol, Total: 216 mg/dL — ABNORMAL HIGH (ref 100–199)
HDL: 45 mg/dL (ref >39)
LDL Chol Calc (NIH): 128 mg/dL — ABNORMAL HIGH (ref 0–99)
Triglycerides: 245 mg/dL — ABNORMAL HIGH (ref 0–149)
VLDL Cholesterol Cal: 43 mg/dL — ABNORMAL HIGH (ref 5–40)

## 2021-01-29 LAB — MICROALBUMIN / CREATININE URINE RATIO
Creatinine, Urine: 97.5 mg/dL
Microalb/Creat Ratio: 9 mg/g creat (ref 0–29)
Microalbumin, Urine: 8.3 ug/mL

## 2021-01-30 MED ORDER — ATORVASTATIN CALCIUM 40 MG PO TABS
40.0000 mg | ORAL_TABLET | Freq: Every day | ORAL | 1 refills | Status: DC
Start: 2021-01-30 — End: 2021-07-20

## 2021-01-30 NOTE — Addendum Note (Signed)
Addended by: [name] on: 01/30/2021 08:19 AM   Modules accepted: Orders

## 2021-02-05 ENCOUNTER — Ambulatory Visit: Payer: Medicare Other | Admitting: *Deleted

## 2021-02-05 DIAGNOSIS — Z794 Long term (current) use of insulin: Secondary | ICD-10-CM

## 2021-02-05 DIAGNOSIS — J441 Chronic obstructive pulmonary disease with (acute) exacerbation: Secondary | ICD-10-CM | POA: Diagnosis not present

## 2021-02-05 DIAGNOSIS — I10 Essential (primary) hypertension: Secondary | ICD-10-CM

## 2021-02-05 DIAGNOSIS — I480 Paroxysmal atrial fibrillation: Secondary | ICD-10-CM

## 2021-02-05 DIAGNOSIS — E1140 Type 2 diabetes mellitus with diabetic neuropathy, unspecified: Secondary | ICD-10-CM

## 2021-02-11 ENCOUNTER — Encounter: Payer: Self-pay | Admitting: *Deleted

## 2021-02-11 NOTE — Patient Instructions (Signed)
Visit Information  PATIENT GOALS: Goals Addressed            This Visit's Progress   . Diabetic Foot Care/Wound Care   On track    Timeframe:  Long-Range Goal Priority:  High Start Date:                             Expected End Date:                       Follow Up Date 03/05/21   . check feet daily for cuts, sores or redness . keep feet up while sitting . wash and dry feet carefully every day . wear comfortable, cotton socks . wear comfortable, well-fitting shoes  . Keep follow-up appointment with podiatrist, [name] . Change dressing daily and as needed if soiled or wet . DO NOT USE alcohol or peroxide to clean the wound . Use saline or water and a gentle soap to clean the wound daily  . Do not soak in water . Apply vaseline or the ointment provided by your doctor to the wound daily and cover with gauze . Take note of any signs of infections: redness, swelling, drainage, odor, increased pain . Call PCP or [name] with any new or worsening symptoms   Why is this important?    Good foot care is very important when you have diabetes.   There are many things you can do to keep your feet healthy and catch a problem early.    Notes:     [name] Monitor and Manage My Blood Sugar-Diabetes Type 2   On track    Timeframe:  Long-Range Goal Priority:  Medium Start Date:                             Expected End Date:                       Follow Up Date 03/05/21   . check blood sugar at prescribed times . check blood sugar if I feel it is too high or too low . enter blood sugar readings and medication or insulin into daily log . take the blood sugar log to all doctor visits . Take medication as prescribed . Plan meals: avoid fried/fatty foods, decrease or eliminate sugar/simple carbs . Keep follow-up appointments with PCP . Call PCP with any readings outside of recommended range   Why is this important?    Checking your blood sugar at home helps to keep it from getting very  high or very low.   Writing the results in a diary or log helps the doctor know how to care for you.   Your blood sugar log should have the time, date and the results.   Also, write down the amount of insulin or other medicine that you take.   Other information, like what you ate, exercise done and how you were feeling, will also be helpful.     Notes:        The patient verbalized understanding of instructions, educational materials, and care plan provided today and declined offer to receive copy of patient instructions, educational materials, and care plan.   Follow Up Plan:  . Telephone follow up appointment with care management team member scheduled for: 03/05/21 with RNCM . The patient has been provided with contact information  for the care management team and has been advised to call with any health related questions or concerns.   [name], BSN, RN-BC Embedded Chronic Care Manager [facility] Care Management Direct Dial: [phone]

## 2021-02-11 NOTE — Chronic Care Management (AMB) (Signed)
Chronic Care Management   CCM RN Visit Note  03/05/21 Name: Todd Zhang MRN: [id number] DOB: 1946/05/02  Subjective: Todd Zhang is a 75 y.o. year old male who is a primary care patient of [name], FNP. The care management team was consulted for assistance with disease management and care coordination needs.    Engaged with patient by telephone for follow up visit in response to provider referral for case management and/or care coordination services.   Consent to Services:  The patient was given information about Chronic Care Management services, agreed to services, and gave verbal consent prior to initiation of services.  Please see initial visit note for detailed documentation.   Patient agreed to services and verbal consent obtained.   Assessment: Review of patient past medical history, allergies, medications, health status, including review of consultants reports, laboratory and other test data, was performed as part of comprehensive evaluation and provision of chronic care management services.   SDOH (Social Determinants of Health) assessments and interventions performed:    CCM Care Plan  Allergies  Allergen Reactions  . Asa [Aspirin] Hives  . Penicillins Hives    Outpatient Encounter Medications as of 02/05/2021  Medication Sig  . alendronate (FOSAMAX) 70 MG tablet TAKE 1 TABLET BY MOUTH ONCE A WEEK. TAKE WITH A FULL GLASS OF WATER ON AN EMPTY STOMACH  . atorvastatin (LIPITOR) 40 MG tablet Take 1 tablet (40 mg total) by mouth daily.  . benazepril (LOTENSIN) 5 MG tablet Take 5 mg by mouth daily.  . cetirizine (ZYRTEC) 10 MG tablet Take 10 mg by mouth daily.   . fenofibrate (TRICOR) 145 MG tablet Take 1 tablet (145 mg total) by mouth daily.  [name] gabapentin (NEURONTIN) 300 MG capsule Take 1 capsule (300 mg total) by mouth 4 (four) times daily. TAKE 1 CAPSULE BY MOUTH THREE TIMES DAILY FOR PAIN  . glucose blood (ACCU-CHEK AVIVA PLUS) test strip USE TO check  blood glucose 4 times daily  . HYDROcodone-acetaminophen (NORCO/VICODIN) 5-325 MG tablet Take 1 tablet by mouth every 4 (four) hours as needed.  . insulin regular (HUMULIN R) 100 units/mL injection INJECT 10 UNITS INTO THE SKIN THREE TIMES DAILY BEFORE MEALS  . Insulin Syringe-Needle U-100 (GLOBAL INJECT EASE INSULIN SYR) 31G X 5/16" 0.3 ML MISC USE WITH INSULIN EVERY DAY Dx E11.40  . metFORMIN (GLUCOPHAGE) 500 MG tablet Take 1 tablet (500 mg total) by mouth 2 (two) times daily.  . metoprolol succinate (TOPROL-XL) 50 MG 24 hr tablet TAKE 1 TABLET BY MOUTH DAILY, TAKE WITH FOOD OR IMMEDIATELY FOLLOWING A MEAL  . Multiple Vitamin (MULTIVITAMIN) tablet Take 1 tablet by mouth daily.  . mupirocin ointment (BACTROBAN) 2 % APPLY TO LEFT GREAT TOE  . omeprazole (PRILOSEC) 20 MG capsule Take 1 capsule (20 mg total) by mouth daily.  . tamsulosin (FLOMAX) 0.4 MG CAPS capsule Take 1 capsule (0.4 mg total) by mouth daily.  [name] triamcinolone cream (KENALOG) 0.5 % APPLY TO THE AFFECTED AREA(S) THREE TIMES DAILY  . vitamin B-12 (CYANOCOBALAMIN) 500 MCG tablet Take 500 mcg by mouth daily.  [name] 20 MG TABS tablet Take 1 tablet (20 mg total) by mouth daily.   No facility-administered encounter medications on file as of 02/05/2021.    Patient Active Problem List   Diagnosis Date Noted  . Pure hypercholesterolemia 07/16/2020  . Peripheral artery disease (HCC)   . COPD exacerbation (HCC) 05/14/2019  . DDD (degenerative disc disease), cervical 11/06/2018  . Cervical spinal stenosis 11/06/2018  .  Generalized anxiety disorder 11/29/2017  . Benign prostatic hyperplasia with incomplete bladder emptying 11/28/2017  . Gastroesophageal reflux disease without esophagitis 09/07/2017  . Essential hypertension 09/01/2017  . Onychomycosis 09/01/2017  . Atrial fibrillation (HCC) 09/01/2017  . Type 2 diabetes mellitus with diabetic neuropathy, with long-term current use of insulin (HCC) 06/19/2014  . Back pain  06/19/2014  . Neck pain 06/19/2014    Conditions to be addressed/monitored:HTN and DMII  Care Plan : RNCM: Diabetes, Type 2  Updates made by Gwenith Daily, RN since 02/11/2021 12:00 AM    Problem: Glycemic Management (Diabetes, Type 2)   Priority: Medium    Long-Range Goal: Glycemic Management Optimized   This Visit's Progress: On track  Recent Progress: On track  Priority: Medium  Note:   Objective: Lab Results  Component Value Date   HGBA1C 7.2 (H) 01/28/2021   HGBA1C 7.1 (H) 10/17/2020   HGBA1C 6.9 07/16/2020   Lab Results  Component Value Date   LDLCALC 128 (H) 01/28/2021   CREATININE 1.73 (H) 01/28/2021   Current Barriers:  . Chronic Disease Management support and education needs related to diabetes . Lacks Sports administrator Case Manager Clinical Goal(s):  . patient will work with PCP to address needs related to medical management of diabetes . patient will meet with RN Care Manager to address self-management of diabetes  Interventions:  . 1:1 collaboration with [name], FNP regarding development and update of comprehensive plan of care as evidenced by provider attestation and co-signature . Inter-disciplinary care team collaboration (see longitudinal plan of care) . Evaluation of current treatment plan related to diabetes and patient's adherence to plan as established by provider. . Chart reviewed including relevant office notes and lab results . Discussed recent office visit and A1C results . Discussed home blood sugar testing and encouraged to continue testing and recording blood sugar 3 times a day . Advised to call PCP with any readings outside of recommended range . Discussed diet o Trying to avoid fried/fatty foods and simple carbs . Reviewed upcoming appointments . Provided with RNCM contact number and encouraged to reach out as needed  Patient Goals/Self-Care Activities Over the next 30 days, patient will: . check blood sugar at  prescribed times . check blood sugar if I feel it is too high or too low . enter blood sugar readings and medication or insulin into daily log . take the blood sugar log to all doctor visits . Take medication as prescribed . Plan meals: avoid fried/fatty foods, decrease or eliminate sugar/simple carbs . Keep follow-up appointments with PCP . Call PCP with any readings outside of recommended range  Follow Up Plan:  . Telephone follow up appointment with care management team member scheduled for: RN Care Manager 03/05/21 . The patient has been provided with contact information for the care management team and has been advised to call with any health related questions or concerns.    Problem: Non-healing Wound Left Foot   Priority: High    Long-Range Goal: Diabetic Foot Care/Wound Care   This Visit's Progress: On track  Recent Progress: Not on track  Priority: High  Note:   Current Barriers:  . Care Coordination needs related to foot wound in a patient with diabetes and hypertension . Lacks Sports administrator Case Manager Clinical Goal(s):  . patient will work with PCP to address needs related to medical management of diabetes and foot complications . patient will meet with RN Care Manager to address self-management of  diabetic foot complications . Patient will demonstrate improved self health management by following up with provider regularly as instructed  Interventions:  . 1:1 collaboration with [name], FNP regarding development and update of comprehensive plan of care as evidenced by provider attestation and co-signature . Inter-disciplinary care team collaboration (see longitudinal plan of care) . Chart reviewed including relevant office notes and lab results . Discussed diabetic foot care . Reinforced basic wound care education o Encouraged patient to clean with saline or mild soap and water and to apply his prescription "ointment" or Vaseline and cover  with gauze.  o Change gauze daily and if wet or soiled.  . Strongly advised to keep follow-up with podiatrist, [name] . Encouraged patient to keep appt with PCP . Reach out to PCP or [name] with any new or worsening symptoms . Verbal education provided on sign/symptoms of infection . Provided with RN Care Manager contact number and encouraged to reach out as needed  Patient Goals/Self-Care Activities Over the next 30 days, patient will: . check feet daily for cuts, sores or redness . keep feet up while sitting . wash and dry feet carefully every day . wear comfortable, cotton socks . wear comfortable, well-fitting shoes  . Keep follow-up appointment with podiatrist, [name] . Change dressing daily and as needed if soiled or wet . DO NOT USE alcohol or peroxide to clean the wound . Use saline or water and a gentle soap to clean the wound daily  . Do not soak in water . Apply vaseline or the ointment provided by your doctor to the wound daily and cover with gauze . Take note of any signs of infections: redness, swelling, drainage, odor, increased pain . Call PCP or [name] with any new or worsening symptoms    Follow Up Plan:  . Telephone follow up appointment with care management team member scheduled for: 03/05/21 with RNCM . The patient has been provided with contact information for the care management team and has been advised to call with any health related questions or concerns.   [name], BSN, RN-BC Embedded Chronic Care Manager [facility] / [facility] Care Management Direct Dial: [phone]

## 2021-02-13 NOTE — Chronic Care Management (AMB) (Signed)
  Care Management   Note  02/13/2021 Name: Todd Zhang MRN: [id number] DOB: May 08, 1946  Todd Zhang is a 75 y.o. year old male who is a primary care patient of [name], FNP and is actively engaged with the care management team. I reached out to [name] by phone today to assist with re-scheduling a follow up visit with the RN Case Manager  Follow up plan: Telephone appointment with care management team member scheduled [id number] already has appt for 03/05/2021  Penne Lash, RMA Care Guide, Embedded Care Coordination [facility]  [place], [place] [address] Direct Dial: [phone] [email or web address] Website: [email or web address]

## 2021-03-02 ENCOUNTER — Other Ambulatory Visit: Payer: Self-pay | Admitting: Nurse Practitioner

## 2021-03-05 ENCOUNTER — Ambulatory Visit (INDEPENDENT_AMBULATORY_CARE_PROVIDER_SITE_OTHER): Payer: Medicare Other | Admitting: *Deleted

## 2021-03-05 DIAGNOSIS — E1140 Type 2 diabetes mellitus with diabetic neuropathy, unspecified: Secondary | ICD-10-CM

## 2021-03-05 DIAGNOSIS — I10 Essential (primary) hypertension: Secondary | ICD-10-CM

## 2021-03-05 DIAGNOSIS — Z794 Long term (current) use of insulin: Secondary | ICD-10-CM

## 2021-03-05 NOTE — Patient Instructions (Signed)
Visit Information  PATIENT GOALS:  Goals Addressed             This Visit's Progress    Diabetic Foot Care/Wound Care   On track    Timeframe:  Long-Range Goal Priority:  High Start Date:                             Expected End Date:                       Follow Up Date 04/08/21   keep feet up while sitting wash and dry feet carefully every day wear comfortable, cotton socks wear comfortable, well-fitting shoes  Schedule follow-up with [name] Change dressing daily and as needed if soiled or wet DO NOT USE alcohol or peroxide to clean the wound Use saline or water and a gentle soap to clean the wound daily  Do not soak in water Apply vaseline or the ointment provided by your doctor to the wound daily and cover with gauze Take note of any signs of infections: redness, swelling, drainage, odor, increased pain Call PCP or [name] with any new or worsening symptoms   Why is this important?   Good foot care is very important when you have diabetes.  There are many things you can do to keep your feet healthy and catch a problem early.    Notes:       Monitor and Manage My Blood Sugar-Diabetes Type 2   On track    Timeframe:  Long-Range Goal Priority:  Medium Start Date:                             Expected End Date:                       Follow Up Date 04/08/21   check blood sugar at prescribed times check blood sugar if I feel it is too high or too low enter blood sugar readings and medication or insulin into daily log take the blood sugar log to all doctor visits Take medication as prescribed Plan meals: avoid fried/fatty foods, decrease or eliminate sugar/simple carbs Keep follow-up appointments with PCP Call PCP with any readings outside of recommended range   Why is this important?   Checking your blood sugar at home helps to keep it from getting very high or very low.  Writing the results in a diary or log helps the doctor know how to care for you.  Your  blood sugar log should have the time, date and the results.  Also, write down the amount of insulin or other medicine that you take.  Other information, like what you ate, exercise done and how you were feeling, will also be helpful.     Notes:          The patient verbalized understanding of instructions, educational materials, and care plan provided today and declined offer to receive copy of patient instructions, educational materials, and care plan.   Telephone follow up appointment with care management team member scheduled for: 04/08/21 with RNCM  [name], BSN, RN-BC Embedded Chronic Care Manager [facility] Care Management Direct Dial: [phone]

## 2021-03-05 NOTE — Chronic Care Management (AMB) (Signed)
Chronic Care Management   CCM RN Visit Note  03/05/2021 Name: Todd Zhang MRN: [id number] DOB: 1945/12/04  Subjective: Todd Zhang is a 75 y.o. year old male who is a primary care patient of [name], FNP. The care management team was consulted for assistance with disease management and care coordination needs.    Engaged with patient by telephone for follow up visit in response to provider referral for case management and/or care coordination services.   Consent to Services:  The patient was given information about Chronic Care Management services, agreed to services, and gave verbal consent prior to initiation of services.  Please see initial visit note for detailed documentation.   Patient agreed to services and verbal consent obtained.   Assessment: Review of patient past medical history, allergies, medications, health status, including review of consultants reports, laboratory and other test data, was performed as part of comprehensive evaluation and provision of chronic care management services.   SDOH (Social Determinants of Health) assessments and interventions performed:    CCM Care Plan  Allergies  Allergen Reactions   Asa [Aspirin] Hives   Penicillins Hives    Outpatient Encounter Medications as of 03/05/2021  Medication Sig   alendronate (FOSAMAX) 70 MG tablet TAKE 1 TABLET BY MOUTH ONCE A WEEK. TAKE WITH A FULL GLASS OF WATER ON AN EMPTY STOMACH   atorvastatin (LIPITOR) 40 MG tablet Take 1 tablet (40 mg total) by mouth daily.   benazepril (LOTENSIN) 5 MG tablet TAKE 1 TABLET BY MOUTH EVERY DAY   cetirizine (ZYRTEC) 10 MG tablet Take 10 mg by mouth daily.    fenofibrate (TRICOR) 145 MG tablet Take 1 tablet (145 mg total) by mouth daily.   gabapentin (NEURONTIN) 300 MG capsule Take 1 capsule (300 mg total) by mouth 4 (four) times daily. TAKE 1 CAPSULE BY MOUTH THREE TIMES DAILY FOR PAIN   glucose blood (ACCU-CHEK AVIVA PLUS) test strip USE TO check  blood glucose 4 times daily   HYDROcodone-acetaminophen (NORCO/VICODIN) 5-325 MG tablet Take 1 tablet by mouth every 4 (four) hours as needed.   insulin regular (HUMULIN R) 100 units/mL injection INJECT 10 UNITS INTO THE SKIN THREE TIMES DAILY BEFORE MEALS   Insulin Syringe-Needle U-100 (GLOBAL INJECT EASE INSULIN SYR) 31G X 5/16" 0.3 ML MISC USE WITH INSULIN EVERY DAY Dx E11.40   metFORMIN (GLUCOPHAGE) 500 MG tablet Take 1 tablet (500 mg total) by mouth 2 (two) times daily.   metoprolol succinate (TOPROL-XL) 50 MG 24 hr tablet TAKE 1 TABLET BY MOUTH DAILY, TAKE WITH FOOD OR IMMEDIATELY FOLLOWING A MEAL   Multiple Vitamin (MULTIVITAMIN) tablet Take 1 tablet by mouth daily.   mupirocin ointment (BACTROBAN) 2 % APPLY TO LEFT GREAT TOE   omeprazole (PRILOSEC) 20 MG capsule Take 1 capsule (20 mg total) by mouth daily.   tamsulosin (FLOMAX) 0.4 MG CAPS capsule Take 1 capsule (0.4 mg total) by mouth daily.   triamcinolone cream (KENALOG) 0.5 % APPLY TO THE AFFECTED AREA(S) THREE TIMES DAILY   vitamin B-12 (CYANOCOBALAMIN) 500 MCG tablet Take 500 mcg by mouth daily.   XARELTO 20 MG TABS tablet Take 1 tablet (20 mg total) by mouth daily.   No facility-administered encounter medications on file as of 03/05/2021.    Patient Active Problem List   Diagnosis Date Noted   Pure hypercholesterolemia 07/16/2020   Peripheral artery disease (HCC)    COPD exacerbation (HCC) 05/14/2019   DDD (degenerative disc disease), cervical 11/06/2018   Cervical spinal stenosis  11/06/2018   Generalized anxiety disorder 11/29/2017   Benign prostatic hyperplasia with incomplete bladder emptying 11/28/2017   Gastroesophageal reflux disease without esophagitis 09/07/2017   Essential hypertension 09/01/2017   Onychomycosis 09/01/2017   Atrial fibrillation (HCC) 09/01/2017   Type 2 diabetes mellitus with diabetic neuropathy, with long-term current use of insulin (HCC) 06/19/2014   Back pain 06/19/2014   Neck pain 06/19/2014     Conditions to be addressed/monitored:HTN and DMII  Care Plan : RNCM: Diabetes, Type 2  Updates made by Gwenith Daily, RN since 03/05/2021 12:00 AM     Problem: Glycemic Management (Diabetes, Type 2)   Priority: Medium     Long-Range Goal: Glycemic Management Optimized   This Visit's Progress: On track  Recent Progress: On track  Priority: Medium  Note:   Objective: Lab Results  Component Value Date   HGBA1C 7.2 (H) 01/28/2021   HGBA1C 7.1 (H) 10/17/2020   HGBA1C 6.9 07/16/2020   Lab Results  Component Value Date   LDLCALC 128 (H) 01/28/2021   CREATININE 1.73 (H) 01/28/2021  Current Barriers:  Chronic Disease Management support and education needs related to diabetes  Nurse Case Manager Clinical Goal(s):  patient will work with PCP to address needs related to medical management of diabetes patient will meet with RN Care Manager to address self-management of diabetes the patient will demonstrate ongoing self health care management ability  Interventions:  1:1 collaboration with [name], FNP regarding development and update of comprehensive plan of care as evidenced by provider attestation and co-signature Inter-disciplinary care team collaboration (see longitudinal plan of care) Evaluation of current treatment plan related to diabetes and patient's adherence to plan as established by provider. Chart reviewed including relevant office notes and lab results Discussed recent office visit and A1C results Discussed home blood sugar testing and encouraged to continue testing and recording blood sugar 3 times a day Advised to call PCP with any readings outside of recommended range Discussed diet Trying to avoid fried/fatty foods and simple carbs Reviewed upcoming appointments Provided with RNCM contact number and encouraged to reach out as needed  Patient Goals/Self-Care Activities Over the next 90 days, patient will: check blood sugar at prescribed  times check blood sugar if I feel it is too high or too low enter blood sugar readings and medication or insulin into daily log take the blood sugar log to all doctor visits Take medication as prescribed Plan meals: avoid fried/fatty foods, decrease or eliminate sugar/simple carbs Keep follow-up appointments with PCP Call PCP with any readings outside of recommended range  Follow Up Plan:  Telephone follow up appointment with care management team member scheduled for: RN Care Manager 04/08/21 The patient has been provided with contact information for the care management team and has been advised to call with any health related questions or concerns.     Problem: Non-healing Wound Left Foot   Priority: High     Long-Range Goal: Diabetic Foot Care/Wound Care   This Visit's Progress: On track  Recent Progress: On track  Priority: High  Note:   Current Barriers:  Care Coordination needs related to foot wound in a patient with diabetes and hypertension Lacks social connections  Nurse Case Manager Clinical Goal(s):  patient will work with PCP to address needs related to medical management of diabetes and foot complications patient will meet with RN Care Manager to address self-management of diabetic foot complications Patient will demonstrate improved self health management by following up with provider regularly as  instructed  Interventions:  1:1 collaboration with [name], FNP regarding development and update of comprehensive plan of care as evidenced by provider attestation and co-signature Inter-disciplinary care team collaboration (see longitudinal plan of care) Chart reviewed including relevant office notes and lab results Encouraged proper diabetic foot care and daily assessment Discussed current state of foot wound Patient feels that it is improving with his care. He has not follow-up with podiatry and feels that it is doing better since he stopped going.  He  does still have pain No drainage Reinforced basic wound care education Encouraged patient to clean with saline or mild soap and water and to apply his prescription "ointment" or Vaseline and cover with gauze.  Change gauze daily and if wet or soiled.  Strongly advised to reschedule follow-up appointment with podiatry Encouraged patient to keep appt with PCP Reach out to PCP or [name] with any new or worsening symptoms Verbal education provided on sign/symptoms of infection Provided with RN Care Manager contact number and encouraged to reach out as needed  Patient Goals/Self-Care Activities Over the next 30 days, patient will: check feet daily for cuts, sores or redness keep feet up while sitting wash and dry feet carefully every day wear comfortable, cotton socks wear comfortable, well-fitting shoes  Schedule follow-up with [name] Change dressing daily and as needed if soiled or wet DO NOT USE alcohol or peroxide to clean the wound Use saline or water and a gentle soap to clean the wound daily  Do not soak in water Apply vaseline or the ointment provided by your doctor to the wound daily and cover with gauze Take note of any signs of infections: redness, swelling, drainage, odor, increased pain Call PCP or [name] with any new or worsening symptoms  Follow Up Plan:  Telephone follow up appointment with care management team member scheduled for: 04/08/21 with [facility] The patient has been provided with contact information for the care management team and has been advised to call with any health related questions or concerns.        Plan:Telephone follow up appointment with care management team member scheduled for:  04/08/21   [name], BSN, RN-BC Embedded Chronic Care Manager [facility] Care Management Direct Dial: [phone]

## 2021-03-10 ENCOUNTER — Ambulatory Visit: Payer: Medicare Other | Admitting: Licensed Clinical Social Worker

## 2021-03-10 DIAGNOSIS — J441 Chronic obstructive pulmonary disease with (acute) exacerbation: Secondary | ICD-10-CM

## 2021-03-10 DIAGNOSIS — I482 Chronic atrial fibrillation, unspecified: Secondary | ICD-10-CM

## 2021-03-10 DIAGNOSIS — F411 Generalized anxiety disorder: Secondary | ICD-10-CM

## 2021-03-10 DIAGNOSIS — I739 Peripheral vascular disease, unspecified: Secondary | ICD-10-CM

## 2021-03-10 DIAGNOSIS — Z794 Long term (current) use of insulin: Secondary | ICD-10-CM

## 2021-03-10 DIAGNOSIS — E1140 Type 2 diabetes mellitus with diabetic neuropathy, unspecified: Secondary | ICD-10-CM

## 2021-03-10 DIAGNOSIS — I10 Essential (primary) hypertension: Secondary | ICD-10-CM

## 2021-03-10 NOTE — Chronic Care Management (AMB) (Signed)
Chronic Care Management    Clinical Social Work Note  03/10/2021 Name: Todd Zhang MRN: [id number] DOB: 10/21/1945  ARSENIO SCHNORR is a 75 y.o. year old male who is a primary care patient of [name], FNP. The CCM team was consulted to assist the patient with chronic disease management and/or care coordination needs related to: [name] .   Engaged with patient by telephone for follow up visit in response to provider referral for social work chronic care management and care coordination services.   Consent to Services:  The patient was given information about Chronic Care Management services, agreed to services, and gave verbal consent prior to initiation of services.  Please see initial visit note for detailed documentation.   Patient agreed to services and consent obtained.   Assessment: Review of patient past medical history, allergies, medications, and health status, including review of relevant consultants reports was performed today as part of a comprehensive evaluation and provision of chronic care management and care coordination services.     SDOH (Social Determinants of Health) assessments and interventions performed:  SDOH Interventions    Flowsheet Row Most Recent Value  SDOH Interventions   Depression Interventions/Treatment  --  [informed client of LCSW support and of RNCM support]        Advanced Directives Status: See Vynca application for related entries.  CCM Care Plan  Allergies  Allergen Reactions   Asa [Aspirin] Hives   Penicillins Hives    Outpatient Encounter Medications as of 03/10/2021  Medication Sig   alendronate (FOSAMAX) 70 MG tablet TAKE 1 TABLET BY MOUTH ONCE A WEEK. TAKE WITH A FULL GLASS OF WATER ON AN EMPTY STOMACH   atorvastatin (LIPITOR) 40 MG tablet Take 1 tablet (40 mg total) by mouth daily.   benazepril (LOTENSIN) 5 MG tablet TAKE 1 TABLET BY MOUTH EVERY DAY   cetirizine (ZYRTEC) 10 MG tablet Take 10 mg by mouth  daily.    fenofibrate (TRICOR) 145 MG tablet Take 1 tablet (145 mg total) by mouth daily.   gabapentin (NEURONTIN) 300 MG capsule Take 1 capsule (300 mg total) by mouth 4 (four) times daily. TAKE 1 CAPSULE BY MOUTH THREE TIMES DAILY FOR PAIN   glucose blood (ACCU-CHEK AVIVA PLUS) test strip USE TO check blood glucose 4 times daily   HYDROcodone-acetaminophen (NORCO/VICODIN) 5-325 MG tablet Take 1 tablet by mouth every 4 (four) hours as needed.   insulin regular (HUMULIN R) 100 units/mL injection INJECT 10 UNITS INTO THE SKIN THREE TIMES DAILY BEFORE MEALS   Insulin Syringe-Needle U-100 (GLOBAL INJECT EASE INSULIN SYR) 31G X 5/16" 0.3 ML MISC USE WITH INSULIN EVERY DAY Dx E11.40   metFORMIN (GLUCOPHAGE) 500 MG tablet Take 1 tablet (500 mg total) by mouth 2 (two) times daily.   metoprolol succinate (TOPROL-XL) 50 MG 24 hr tablet TAKE 1 TABLET BY MOUTH DAILY, TAKE WITH FOOD OR IMMEDIATELY FOLLOWING A MEAL   Multiple Vitamin (MULTIVITAMIN) tablet Take 1 tablet by mouth daily.   mupirocin ointment (BACTROBAN) 2 % APPLY TO LEFT GREAT TOE   omeprazole (PRILOSEC) 20 MG capsule Take 1 capsule (20 mg total) by mouth daily.   tamsulosin (FLOMAX) 0.4 MG CAPS capsule Take 1 capsule (0.4 mg total) by mouth daily.   triamcinolone cream (KENALOG) 0.5 % APPLY TO THE AFFECTED AREA(S) THREE TIMES DAILY   vitamin B-12 (CYANOCOBALAMIN) 500 MCG tablet Take 500 mcg by mouth daily.   XARELTO 20 MG TABS tablet Take 1 tablet (20 mg total) by  mouth daily.   No facility-administered encounter medications on file as of 03/10/2021.    Patient Active Problem List   Diagnosis Date Noted   Pure hypercholesterolemia 07/16/2020   Peripheral artery disease (HCC)    COPD exacerbation (HCC) 05/14/2019   DDD (degenerative disc disease), cervical 11/06/2018   Cervical spinal stenosis 11/06/2018   Generalized anxiety disorder 11/29/2017   Benign prostatic hyperplasia with incomplete bladder emptying 11/28/2017   Gastroesophageal  reflux disease without esophagitis 09/07/2017   Essential hypertension 09/01/2017   Onychomycosis 09/01/2017   Atrial fibrillation (HCC) 09/01/2017   Type 2 diabetes mellitus with diabetic neuropathy, with long-term current use of insulin (HCC) 06/19/2014   Back pain 06/19/2014   Neck pain 06/19/2014    Conditions to be addressed/monitored: Monitor client management of housing needs and management of daily activities  Care Plan : LCSW Care plan  Updates made by Isaiah Blakes, LCSW since 03/10/2021 12:00 AM     Problem: Coping Skills (General Plan of Care)      Goal: Coping Skills Enhanced: manage housing needs of client   Start Date: 03/10/2021  Expected End Date: 06/08/2021  This Visit's Progress: On track  Recent Progress: On track  Priority: Medium  Note:   Current barriers:   Patient in need of assistance with connecting to community resources for possible help with housing needs of client Patient is unable to independently navigate community resource options without care coordination support Mobility issues and pain issues  Clinical Goals:   LCSW to call client in next 30 days to discuss housing needs and housing situation of client Client to call RNCM or LCSW as needed in next 30 days for CCM program support  Clinical Interventions:  Collaboration with Daphine Deutscher, [name], FNP regarding development and update of comprehensive plan of care as evidenced by provider attestation and co-signature Talked with client about wound care status for his foot (he said he thinks the wound is starting to heal. 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 He said he did not have an inhaler or nebulizer machine) Talked with client about sleeping issues of client Talked with client about medication procurement Talked with client about housing situation (he is at [employer]) Talked with client about fact that his brother also lives at [facility] Talked with client about edema in his legs (he said that his legs swell occasionally) Talked with client about upcoming client appointments  Talked with client about relaxation techniques (likes to listen to music) Talked with client about skin issues of client Talked with client about vision of client Encouraged client to call RNCM as needed for nursing support Talked with client about sleeping issues of client Collaborated with RNCM regarding breathing challenges of client  Patient Coping Skills: Attends scheduled medical appointments No transport needs Completes ADLs daily  Patient deficits:   Mobility issues Housing needs  Patient Goals:   Patient will call RNCM or LCSW as needed in next 30 days for CCM  support Patient will attend all scheduled client medical appointments in next 30 days Patient will communicate regularly with his sister, Aurea Graff, in next 30 days to discuss ongoing needs of client -  Follow Up Plan: LCSW to call client on 04/22/21      Kelton Pillar.Kristi Hyer MSW, LCSW Licensed Clinical Social Worker [facility] Care Management [phone]

## 2021-03-10 NOTE — Patient Instructions (Signed)
Visit Information  PATIENT GOALS:  Goals Addressed             This Visit's Progress    Manage My Emotions       Protect My Health and manage housing needs of client       Timeframe:  Short-Term Goal Priority:  Medium Progress: On Track Start Date:           03/10/21                Expected End Date:         06/08/21            Follow Up Date 04/22/21    Protect My Health (Patient)  Manage housing needs of client    Why is this important?   Screening tests can find diseases early when they are easier to treat.  Your doctor or nurse will talk with you about which tests are important for you.  Getting shots for common diseases like the flu and shingles will help prevent them.     Patient Coping Skills: Attends scheduled medical appointments No transport needs Completes ADLs daily  Patient deficits:   Mobility issues Housing needs  Patient Goals:   Patient will call RNCM or LCSW as needed in next 30 days for CCM support Patient will attend all scheduled client medical appointments in next 30 days Patient will communicate regularly with his sister, Aurea Graff, in next 30 days to discuss ongoing needs of client -  Follow Up Plan: LCSW to call client on 04/22/21     Kelton Pillar.Christyan Reger MSW, LCSW Licensed Clinical Social Worker [facility] Care Management [phone]

## 2021-04-08 ENCOUNTER — Telehealth: Payer: Medicare Other

## 2021-04-22 ENCOUNTER — Telehealth: Payer: Medicare Other

## 2021-04-22 ENCOUNTER — Ambulatory Visit (INDEPENDENT_AMBULATORY_CARE_PROVIDER_SITE_OTHER): Payer: Medicare Other | Admitting: Licensed Clinical Social Worker

## 2021-04-22 DIAGNOSIS — J441 Chronic obstructive pulmonary disease with (acute) exacerbation: Secondary | ICD-10-CM

## 2021-04-22 DIAGNOSIS — E1140 Type 2 diabetes mellitus with diabetic neuropathy, unspecified: Secondary | ICD-10-CM

## 2021-04-22 DIAGNOSIS — I482 Chronic atrial fibrillation, unspecified: Secondary | ICD-10-CM

## 2021-04-22 DIAGNOSIS — I10 Essential (primary) hypertension: Secondary | ICD-10-CM

## 2021-04-22 DIAGNOSIS — F411 Generalized anxiety disorder: Secondary | ICD-10-CM

## 2021-04-22 DIAGNOSIS — Z794 Long term (current) use of insulin: Secondary | ICD-10-CM
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
## 2021-04-22 NOTE — Patient Instructions (Signed)
Visit Information  PATIENT GOALS:  Goals Addressed             This Visit's Progress    Protect My Health and manage housing needs of client; client to talk with [facility] nursing staff about breathing challenges of client       Timeframe:  Short-Term Goal Priority:  High Progress: Not On Track Start Date:           04/22/21                Expected End Date:         07/22/21            Follow Up Date 06/01/21   Protect My Health (Patient)  Manage housing needs of client ; client to talk with [facility] nursing staff about breathing challenges of client   Why is this important?   Screening tests can find diseases early when they are easier to treat.  Your doctor or nurse will talk with you about which tests are important for you.  Getting shots for common diseases like the flu and shingles will help prevent them.     Patient Coping Skills: Attends scheduled medical appointments No transport needs Completes ADLs daily  Patient deficits:   Mobility issues Housing needs  Patient Goals:   Patient will call RNCM or LCSW as needed in next 30 days for CCM support Patient will attend all scheduled client medical appointments in next 30 days Patient will communicate regularly with his sister, Aurea Graff, in next 30 days to discuss ongoing needs of client -  Follow Up Plan: LCSW to call client on 06/01/21 to assess client needs     Kelton Pillar.Alayla Dethlefs MSW, LCSW Licensed Clinical Social Worker [facility] Care Management [phone]

## 2021-04-23 ENCOUNTER — Ambulatory Visit: Payer: Medicare Other | Admitting: *Deleted

## 2021-04-23 DIAGNOSIS — I10 Essential (primary) hypertension: Secondary | ICD-10-CM

## 2021-04-23 DIAGNOSIS — Z794 Long term (current) use of insulin: Secondary | ICD-10-CM

## 2021-04-23 DIAGNOSIS — E1140 Type 2 diabetes mellitus with diabetic neuropathy, unspecified: Secondary | ICD-10-CM

## 2021-04-23 DIAGNOSIS — J441 Chronic obstructive pulmonary disease with (acute) exacerbation: Secondary | ICD-10-CM

## 2021-04-23 DIAGNOSIS — I482 Chronic atrial fibrillation, unspecified: Secondary | ICD-10-CM | POA: Diagnosis not present

## 2021-04-24 ENCOUNTER — Ambulatory Visit: Payer: Medicare Other | Admitting: Nurse Practitioner

## 2021-04-24 ENCOUNTER — Encounter (HOSPITAL_COMMUNITY): Payer: Self-pay

## 2021-04-24 ENCOUNTER — Inpatient Hospital Stay (HOSPITAL_COMMUNITY): Payer: Medicare Other

## 2021-04-24 ENCOUNTER — Other Ambulatory Visit: Payer: Self-pay

## 2021-04-24 ENCOUNTER — Emergency Department (HOSPITAL_COMMUNITY): Payer: Medicare Other

## 2021-04-24 ENCOUNTER — Inpatient Hospital Stay (HOSPITAL_COMMUNITY)
Admission: EM | Admit: 2021-04-24 | Discharge: 2021-05-09 | DRG: 853 | Disposition: A | Discharge status: Skilled Nursing Facility | Payer: Medicare Other | Attending: Internal Medicine | Admitting: Internal Medicine

## 2021-04-24 DIAGNOSIS — Z6830 Body mass index (BMI) 30.0-30.9, adult: Secondary | ICD-10-CM

## 2021-04-24 DIAGNOSIS — J9811 Atelectasis: Secondary | ICD-10-CM | POA: Diagnosis present

## 2021-04-24 DIAGNOSIS — E11628 Type 2 diabetes mellitus with other skin complications: Secondary | ICD-10-CM | POA: Diagnosis present

## 2021-04-24 DIAGNOSIS — L089 Local infection of the skin and subcutaneous tissue, unspecified: Secondary | ICD-10-CM | POA: Diagnosis present

## 2021-04-24 DIAGNOSIS — N179 Acute kidney failure, unspecified: Secondary | ICD-10-CM | POA: Diagnosis present

## 2021-04-24 DIAGNOSIS — I70262 Atherosclerosis of native arteries of extremities with gangrene, left leg: Secondary | ICD-10-CM | POA: Diagnosis present

## 2021-04-24 DIAGNOSIS — L03032 Cellulitis of left toe: Secondary | ICD-10-CM | POA: Diagnosis not present

## 2021-04-24 DIAGNOSIS — E1122 Type 2 diabetes mellitus with diabetic chronic kidney disease: Secondary | ICD-10-CM | POA: Diagnosis present

## 2021-04-24 DIAGNOSIS — Z01810 Encounter for preprocedural cardiovascular examination: Secondary | ICD-10-CM | POA: Diagnosis not present

## 2021-04-24 DIAGNOSIS — I214 Non-ST elevation (NSTEMI) myocardial infarction: Secondary | ICD-10-CM

## 2021-04-24 DIAGNOSIS — J9611 Chronic respiratory failure with hypoxia: Secondary | ICD-10-CM | POA: Diagnosis present

## 2021-04-24 DIAGNOSIS — Z8249 Family history of ischemic heart disease and other diseases of the circulatory system: Secondary | ICD-10-CM

## 2021-04-24 DIAGNOSIS — I4891 Unspecified atrial fibrillation: Secondary | ICD-10-CM | POA: Diagnosis not present

## 2021-04-24 DIAGNOSIS — L03119 Cellulitis of unspecified part of limb: Secondary | ICD-10-CM | POA: Diagnosis not present

## 2021-04-24 DIAGNOSIS — A401 Sepsis due to streptococcus, group B: Secondary | ICD-10-CM | POA: Diagnosis present

## 2021-04-24 DIAGNOSIS — L03116 Cellulitis of left lower limb: Secondary | ICD-10-CM | POA: Diagnosis present

## 2021-04-24 DIAGNOSIS — K219 Gastro-esophageal reflux disease without esophagitis: Secondary | ICD-10-CM | POA: Diagnosis present

## 2021-04-24 DIAGNOSIS — E1152 Type 2 diabetes mellitus with diabetic peripheral angiopathy with gangrene: Secondary | ICD-10-CM | POA: Diagnosis present

## 2021-04-24 DIAGNOSIS — Z20822 Contact with and (suspected) exposure to covid-19: Secondary | ICD-10-CM | POA: Diagnosis present

## 2021-04-24 DIAGNOSIS — E785 Hyperlipidemia, unspecified: Secondary | ICD-10-CM | POA: Diagnosis not present

## 2021-04-24 DIAGNOSIS — I5031 Acute diastolic (congestive) heart failure: Secondary | ICD-10-CM | POA: Diagnosis present

## 2021-04-24 DIAGNOSIS — R0602 Shortness of breath: Secondary | ICD-10-CM

## 2021-04-24 DIAGNOSIS — R0600 Dyspnea, unspecified: Secondary | ICD-10-CM

## 2021-04-24 DIAGNOSIS — E871 Hypo-osmolality and hyponatremia: Secondary | ICD-10-CM | POA: Diagnosis present

## 2021-04-24 DIAGNOSIS — N1832 Chronic kidney disease, stage 3b: Secondary | ICD-10-CM | POA: Diagnosis present

## 2021-04-24 DIAGNOSIS — D649 Anemia, unspecified: Secondary | ICD-10-CM | POA: Diagnosis present

## 2021-04-24 DIAGNOSIS — L02619 Cutaneous abscess of unspecified foot: Secondary | ICD-10-CM

## 2021-04-24 DIAGNOSIS — I70245 Atherosclerosis of native arteries of left leg with ulceration of other part of foot: Secondary | ICD-10-CM | POA: Diagnosis not present

## 2021-04-24 DIAGNOSIS — E11621 Type 2 diabetes mellitus with foot ulcer: Secondary | ICD-10-CM | POA: Diagnosis present

## 2021-04-24 DIAGNOSIS — M86172 Other acute osteomyelitis, left ankle and foot: Secondary | ICD-10-CM

## 2021-04-24 DIAGNOSIS — R6520 Severe sepsis without septic shock: Secondary | ICD-10-CM | POA: Diagnosis present

## 2021-04-24 DIAGNOSIS — E669 Obesity, unspecified: Secondary | ICD-10-CM | POA: Diagnosis present

## 2021-04-24 DIAGNOSIS — I130 Hypertensive heart and chronic kidney disease with heart failure and stage 1 through stage 4 chronic kidney disease, or unspecified chronic kidney disease: Secondary | ICD-10-CM | POA: Diagnosis present

## 2021-04-24 DIAGNOSIS — R109 Unspecified abdominal pain: Secondary | ICD-10-CM | POA: Diagnosis present

## 2021-04-24 DIAGNOSIS — Z794 Long term (current) use of insulin: Secondary | ICD-10-CM

## 2021-04-24 DIAGNOSIS — I340 Nonrheumatic mitral (valve) insufficiency: Secondary | ICD-10-CM | POA: Diagnosis not present

## 2021-04-24 DIAGNOSIS — Z7901 Long term (current) use of anticoagulants: Secondary | ICD-10-CM

## 2021-04-24 DIAGNOSIS — E861 Hypovolemia: Secondary | ICD-10-CM | POA: Diagnosis present

## 2021-04-24 DIAGNOSIS — N189 Chronic kidney disease, unspecified: Secondary | ICD-10-CM | POA: Diagnosis not present

## 2021-04-24 DIAGNOSIS — R7881 Bacteremia: Secondary | ICD-10-CM | POA: Diagnosis not present

## 2021-04-24 DIAGNOSIS — Z886 Allergy status to analgesic agent status: Secondary | ICD-10-CM

## 2021-04-24 DIAGNOSIS — Z9181 History of falling: Secondary | ICD-10-CM

## 2021-04-24 DIAGNOSIS — Z87891 Personal history of nicotine dependence: Secondary | ICD-10-CM

## 2021-04-24 DIAGNOSIS — L97529 Non-pressure chronic ulcer of other part of left foot with unspecified severity: Secondary | ICD-10-CM | POA: Diagnosis present

## 2021-04-24 DIAGNOSIS — A419 Sepsis, unspecified organism: Secondary | ICD-10-CM

## 2021-04-24 DIAGNOSIS — E1165 Type 2 diabetes mellitus with hyperglycemia: Secondary | ICD-10-CM | POA: Diagnosis present

## 2021-04-24 DIAGNOSIS — E1169 Type 2 diabetes mellitus with other specified complication: Secondary | ICD-10-CM | POA: Diagnosis present

## 2021-04-24 DIAGNOSIS — Z880 Allergy status to penicillin: Secondary | ICD-10-CM

## 2021-04-24 DIAGNOSIS — I129 Hypertensive chronic kidney disease with stage 1 through stage 4 chronic kidney disease, or unspecified chronic kidney disease: Secondary | ICD-10-CM | POA: Diagnosis not present

## 2021-04-24 DIAGNOSIS — Z89422 Acquired absence of other left toe(s): Secondary | ICD-10-CM

## 2021-04-24 DIAGNOSIS — B955 Unspecified streptococcus as the cause of diseases classified elsewhere: Secondary | ICD-10-CM | POA: Diagnosis not present

## 2021-04-24 DIAGNOSIS — N183 Chronic kidney disease, stage 3 unspecified: Secondary | ICD-10-CM

## 2021-04-24 DIAGNOSIS — Z7984 Long term (current) use of oral hypoglycemic drugs: Secondary | ICD-10-CM

## 2021-04-24 DIAGNOSIS — J982 Interstitial emphysema: Secondary | ICD-10-CM | POA: Diagnosis present

## 2021-04-24 DIAGNOSIS — N200 Calculus of kidney: Secondary | ICD-10-CM | POA: Diagnosis present

## 2021-04-24 DIAGNOSIS — Z79899 Other long term (current) drug therapy: Secondary | ICD-10-CM

## 2021-04-24 LAB — CBC WITH DIFFERENTIAL/PLATELET
Abs Immature Granulocytes: 0.09 10*3/uL — ABNORMAL HIGH (ref 0.00–0.07)
Basophils Absolute: 0 10*3/uL (ref 0.0–0.1)
Basophils Relative: 0 %
Eosinophils Absolute: 0 10*3/uL (ref 0.0–0.5)
Eosinophils Relative: 0 %
HCT: 35.7 % — ABNORMAL LOW (ref 39.0–52.0)
Hemoglobin: 12.4 g/dL — ABNORMAL LOW (ref 13.0–17.0)
Immature Granulocytes: 1 %
Lymphocytes Relative: 8 %
Lymphs Abs: 1.2 10*3/uL (ref 0.7–4.0)
MCH: 31.9 pg (ref 26.0–34.0)
MCHC: 34.7 g/dL (ref 30.0–36.0)
MCV: 91.8 fL (ref 80.0–100.0)
Monocytes Absolute: 1 10*3/uL (ref 0.1–1.0)
Monocytes Relative: 7 %
Neutro Abs: 12 10*3/uL — ABNORMAL HIGH (ref 1.7–7.7)
Neutrophils Relative %: 84 %
Platelets: 188 10*3/uL (ref 150–400)
RBC: 3.89 MIL/uL — ABNORMAL LOW (ref 4.22–5.81)
RDW: 12.8 % (ref 11.5–15.5)
WBC: 14.3 10*3/uL — ABNORMAL HIGH (ref 4.0–10.5)
nRBC: 0 % (ref 0.0–0.2)

## 2021-04-24 LAB — HEMOGLOBIN A1C
Hgb A1c MFr Bld: 8.3 % — ABNORMAL HIGH (ref 4.8–5.6)
Mean Plasma Glucose: 191.51 mg/dL

## 2021-04-24 LAB — COMPREHENSIVE METABOLIC PANEL
ALT: 33 U/L (ref 0–44)
AST: 40 U/L (ref 15–41)
Albumin: 3.1 g/dL — ABNORMAL LOW (ref 3.5–5.0)
Alkaline Phosphatase: 65 U/L (ref 38–126)
Anion gap: 7 (ref 5–15)
BUN: 27 mg/dL — ABNORMAL HIGH (ref 8–23)
CO2: 24 mmol/L (ref 22–32)
Calcium: 8.7 mg/dL — ABNORMAL LOW (ref 8.9–10.3)
Chloride: 96 mmol/L — ABNORMAL LOW (ref 98–111)
Creatinine, Ser: 1.89 mg/dL — ABNORMAL HIGH (ref 0.61–1.24)
GFR, Estimated: 37 mL/min — ABNORMAL LOW (ref >60)
Glucose, Bld: 333 mg/dL — ABNORMAL HIGH (ref 70–99)
Potassium: 4.4 mmol/L (ref 3.5–5.1)
Sodium: 127 mmol/L — ABNORMAL LOW (ref 135–145)
Total Bilirubin: 1.1 mg/dL (ref 0.3–1.2)
Total Protein: 6.9 g/dL (ref 6.5–8.1)

## 2021-04-24 LAB — RESP PANEL BY RT-PCR (FLU A&B, COVID) ARPGX2
Influenza A by PCR: NEGATIVE
Influenza B by PCR: NEGATIVE
SARS Coronavirus 2 by RT PCR: NEGATIVE

## 2021-04-24 LAB — URINALYSIS, ROUTINE W REFLEX MICROSCOPIC
Bacteria, UA: NONE SEEN
Bilirubin Urine: NEGATIVE
Glucose, UA: >=500 mg/dL — AB
Ketones, ur: NEGATIVE mg/dL
Leukocytes,Ua: NEGATIVE
Nitrite: NEGATIVE
Protein, ur: 30 mg/dL — AB
Specific Gravity, Urine: 1.018 (ref 1.005–1.030)
pH: 5 (ref 5.0–8.0)

## 2021-04-24 LAB — LACTIC ACID, PLASMA
Lactic Acid, Venous: 1.3 mmol/L (ref 0.5–1.9)
Lactic Acid, Venous: 2.2 mmol/L (ref 0.5–1.9)

## 2021-04-24 LAB — PROTIME-INR
INR: 1.4 — ABNORMAL HIGH (ref 0.8–1.2)
Prothrombin Time: 17 seconds — ABNORMAL HIGH (ref 11.4–15.2)

## 2021-04-24 LAB — SEDIMENTATION RATE: Sed Rate: 119 mm/hr — ABNORMAL HIGH (ref 0–16)

## 2021-04-24 LAB — CBG MONITORING, ED: Glucose-Capillary: 175 mg/dL — ABNORMAL HIGH (ref 70–99)

## 2021-04-24 LAB — APTT: aPTT: 33 seconds (ref 24–36)

## 2021-04-24 MED ORDER — TAMSULOSIN HCL 0.4 MG PO CAPS
0.4000 mg | ORAL_CAPSULE | Freq: Every day | ORAL | Status: DC
  Administered 2021-04-24 – 2021-05-09 (×16): 0.4 mg via ORAL
  Filled 2021-04-24 (×16): qty 1

## 2021-04-24 MED ORDER — INSULIN ASPART 100 UNIT/ML IJ SOLN
0.0000 [IU] | Freq: Three times a day (TID) | INTRAMUSCULAR | Status: DC
  Administered 2021-04-25 – 2021-04-26 (×6): 2 [IU] via SUBCUTANEOUS
  Administered 2021-04-27: 3 [IU] via SUBCUTANEOUS
  Administered 2021-04-27: 2 [IU] via SUBCUTANEOUS
  Administered 2021-04-27: 3 [IU] via SUBCUTANEOUS
  Administered 2021-04-28 (×2): 2 [IU] via SUBCUTANEOUS
  Administered 2021-04-28: 3 [IU] via SUBCUTANEOUS
  Administered 2021-04-29 (×2): 2 [IU] via SUBCUTANEOUS
  Administered 2021-04-29: 3 [IU] via SUBCUTANEOUS
  Administered 2021-04-30: 2 [IU] via SUBCUTANEOUS
  Administered 2021-05-01: 1 [IU] via SUBCUTANEOUS
  Administered 2021-05-01: 2 [IU] via SUBCUTANEOUS
  Administered 2021-05-02: 1 [IU] via SUBCUTANEOUS
  Administered 2021-05-02: 2 [IU] via SUBCUTANEOUS
  Administered 2021-05-03: 1 [IU] via SUBCUTANEOUS
  Administered 2021-05-03: 3 [IU] via SUBCUTANEOUS
  Administered 2021-05-03 – 2021-05-04 (×2): 1 [IU] via SUBCUTANEOUS
  Administered 2021-05-05: 2 [IU] via SUBCUTANEOUS
  Administered 2021-05-05: 3 [IU] via SUBCUTANEOUS
  Administered 2021-05-05 – 2021-05-06 (×2): 2 [IU] via SUBCUTANEOUS

## 2021-04-24 MED ORDER — METOPROLOL SUCCINATE ER 50 MG PO TB24
50.0000 mg | ORAL_TABLET | Freq: Every day | ORAL | Status: DC
  Administered 2021-04-24 – 2021-04-27 (×4): 50 mg via ORAL
  Filled 2021-04-24 (×4): qty 1

## 2021-04-24 MED ORDER — ATORVASTATIN CALCIUM 40 MG PO TABS
40.0000 mg | ORAL_TABLET | Freq: Every day | ORAL | Status: DC
  Administered 2021-04-24 – 2021-05-09 (×15): 40 mg via ORAL
  Filled 2021-04-24 (×15): qty 1

## 2021-04-24 MED ORDER — SODIUM CHLORIDE 0.9 % IV SOLN
INTRAVENOUS | Status: DC
Start: 2021-04-24 — End: 2021-04-28

## 2021-04-24 MED ORDER — VITAMIN B-12 1000 MCG PO TABS
500.0000 ug | ORAL_TABLET | Freq: Every day | ORAL | Status: DC
  Administered 2021-04-24 – 2021-05-09 (×15): 500 ug via ORAL
  Filled 2021-04-24 (×7): qty 1
  Filled 2021-04-24: qty 0.5
  Filled 2021-04-24 (×5): qty 1
  Filled 2021-04-24: qty 5
  Filled 2021-04-24: qty 1

## 2021-04-24 MED ORDER — ACETAMINOPHEN 650 MG RE SUPP
650.0000 mg | Freq: Four times a day (QID) | RECTAL | Status: DC | PRN
  Administered 2021-04-26: 650 mg via RECTAL
  Filled 2021-04-24: qty 1

## 2021-04-24 MED ORDER — GABAPENTIN 300 MG PO CAPS
300.0000 mg | ORAL_CAPSULE | Freq: Three times a day (TID) | ORAL | Status: DC
  Administered 2021-04-24 – 2021-05-09 (×43): 300 mg via ORAL
  Filled 2021-04-24 (×43): qty 1

## 2021-04-24 MED ORDER — HYDROCODONE-ACETAMINOPHEN 5-325 MG PO TABS
1.0000 | ORAL_TABLET | Freq: Four times a day (QID) | ORAL | Status: DC | PRN
  Administered 2021-04-24 – 2021-05-04 (×9): 1 via ORAL
  Filled 2021-04-24 (×8): qty 1

## 2021-04-24 MED ORDER — INSULIN ASPART 100 UNIT/ML IJ SOLN
0.0000 [IU] | Freq: Every day | INTRAMUSCULAR | Status: DC
  Administered 2021-04-27: 2 [IU] via SUBCUTANEOUS
  Administered 2021-04-30 – 2021-05-03 (×2): 3 [IU] via SUBCUTANEOUS

## 2021-04-24 MED ORDER — ADULT MULTIVITAMIN W/MINERALS CH
1.0000 | ORAL_TABLET | Freq: Every day | ORAL | Status: DC
  Administered 2021-04-24 – 2021-05-09 (×15): 1 via ORAL
  Filled 2021-04-24 (×15): qty 1

## 2021-04-24 MED ORDER — SODIUM CHLORIDE 0.9 % IV SOLN
500.0000 mg | Freq: Three times a day (TID) | INTRAVENOUS | Status: DC
  Administered 2021-04-24 – 2021-04-27 (×8): 500 mg via INTRAVENOUS
  Filled 2021-04-24 (×14): qty 0.5

## 2021-04-24 MED ORDER — ONDANSETRON HCL 4 MG PO TABS: 4.0000 mg | ORAL_TABLET | Freq: Four times a day (QID) | ORAL | Status: DC | PRN

## 2021-04-24 MED ORDER — VANCOMYCIN HCL 2000 MG/400ML IV SOLN
2000.0000 mg | Freq: Once | INTRAVENOUS | Status: AC
  Administered 2021-04-24: 2000 mg via INTRAVENOUS
  Filled 2021-04-24: qty 400

## 2021-04-24 MED ORDER — LACTATED RINGERS IV BOLUS
1000.0000 mL | Freq: Once | INTRAVENOUS | Status: AC
  Administered 2021-04-24: 1000 mL via INTRAVENOUS

## 2021-04-24 MED ORDER — CLINDAMYCIN PHOSPHATE 600 MG/50ML IV SOLN
600.0000 mg | Freq: Once | INTRAVENOUS | Status: AC
  Administered 2021-04-24: 600 mg via INTRAVENOUS
  Filled 2021-04-24: qty 50

## 2021-04-24 MED ORDER — VITAMIN B-12 500 MCG PO TABS: 500.0000 ug | ORAL_TABLET | Freq: Every day | ORAL | Status: DC

## 2021-04-24 MED ORDER — SODIUM CHLORIDE 0.9 % IV BOLUS
500.0000 mL | Freq: Once | INTRAVENOUS | Status: AC
  Administered 2021-04-24: 500 mL via INTRAVENOUS

## 2021-04-24 MED ORDER — PANTOPRAZOLE SODIUM 40 MG PO TBEC
40.0000 mg | DELAYED_RELEASE_TABLET | Freq: Every day | ORAL | Status: DC
  Administered 2021-04-24 – 2021-05-09 (×15): 40 mg via ORAL
  Filled 2021-04-24 (×15): qty 1

## 2021-04-24 MED ORDER — LORATADINE 10 MG PO TABS
10.0000 mg | ORAL_TABLET | Freq: Every day | ORAL | Status: DC
  Administered 2021-04-24 – 2021-05-09 (×15): 10 mg via ORAL
  Filled 2021-04-24 (×15): qty 1

## 2021-04-24 MED ORDER — VANCOMYCIN HCL 1250 MG/250ML IV SOLN
1250.0000 mg | INTRAVENOUS | Status: DC
  Administered 2021-04-25 – 2021-04-26 (×2): 1250 mg via INTRAVENOUS
  Filled 2021-04-24 (×2): qty 250

## 2021-04-24 MED ORDER — ONDANSETRON HCL 4 MG/2ML IJ SOLN: 4.0000 mg | Freq: Four times a day (QID) | INTRAMUSCULAR | Status: DC | PRN

## 2021-04-24 MED ORDER — CIPROFLOXACIN IN D5W 400 MG/200ML IV SOLN
400.0000 mg | Freq: Once | INTRAVENOUS | Status: AC
  Administered 2021-04-24: 400 mg via INTRAVENOUS
  Filled 2021-04-24: qty 200

## 2021-04-24 MED ORDER — FENOFIBRATE 160 MG PO TABS: 160.0000 mg | ORAL_TABLET | Freq: Every day | ORAL | Status: DC

## 2021-04-24 MED ORDER — ACETAMINOPHEN 325 MG PO TABS
650.0000 mg | ORAL_TABLET | Freq: Four times a day (QID) | ORAL | Status: DC | PRN
  Administered 2021-04-25 – 2021-05-05 (×3): 650 mg via ORAL
  Filled 2021-04-24 (×3): qty 2

## 2021-04-24 NOTE — ED Triage Notes (Signed)
Pt arrived [facility] for left flank pain Mon he was at [facility] for heat exposure and this pain has been hurting since. Left great toe red, and was supposed to see a Dr here at [facility] but pt did not go.

## 2021-04-24 NOTE — ED Notes (Signed)
Brandy, [facility] made aware of need for meropenem anbx

## 2021-04-24 NOTE — Progress Notes (Signed)
Pharmacy Antibiotic Note  Todd Zhang is a 75 y.o. male admitted on 04/24/2021 with sepsis.  Pharmacy has been consulted for Vancomycin dosing.  Plan: Vancomycin 2000 mg IV x 1 dose. Vancomycin 1250 mg IV every 24 hours. Monitor labs, c/s, and vanco level as indicated.  Height: 6' (182.9 cm) Weight: 108.9 kg (240 lb) IBW/kg (Calculated) : 77.6  Temp (24hrs), Avg:98.4 F (36.9 C), Min:98.2 F (36.8 C), Max:98.6 F (37 C)  Recent Labs  Lab 04/24/21 1041 04/24/21 1215  WBC 14.3*  --   CREATININE 1.89*  --   LATICACIDVEN 1.3 2.2*    Estimated Creatinine Clearance: 43 mL/min (A) (by C-G formula based on SCr of 1.89 mg/dL (H)).    Allergies  Allergen Reactions   Asa [Aspirin] Hives   Penicillins Hives    Antimicrobials this admission: Vanco 8/12 >>  Merrem 8/12 >>    Microbiology results: 8/12 BCx: pending 8/12 UCx: pending    Thank you for allowing pharmacy to be a part of this patient's care.  Tad Moore 04/24/2021 1:48 PM

## 2021-04-24 NOTE — ED Notes (Signed)
Date and time results received: 04/24/21  1310  Test: Lactic acid  Critical Value: 2.2   Name of Provider Notified: EDP  Orders Received? Or Actions Taken?: Notified

## 2021-04-24 NOTE — ED Provider Notes (Signed)
[facility] EMERGENCY DEPARTMENT Provider Note   CSN: [id number] Arrival date & time: 04/24/21  9373     History Chief Complaint  Patient presents with   Flank Pain    Todd Zhang is a 75 y.o. male.  Patient complains of left flank pain along with swelling redness and a black left toe.  Patient has a history of diabetes  The history is provided by the patient. No language interpreter was used.  Flank Pain This is a new problem. The current episode started 12 to 24 hours ago. The problem occurs constantly. The problem has not changed since onset.Pertinent negatives include no chest pain, no abdominal pain and no headaches. Nothing aggravates the symptoms. He has tried nothing for the symptoms. The treatment provided no relief.      Past Medical History:  Diagnosis Date   Atrial fibrillation (HCC)    Cervical spinal stenosis    DDD (degenerative disc disease), cervical    DDD (degenerative disc disease), lumbar    Diabetes mellitus without complication (HCC)    Hyperlipidemia    Hypertension    Peripheral artery disease (HCC)     Patient Active Problem List   Diagnosis Date Noted   Diabetic foot infection (HCC) 04/24/2021   Pure hypercholesterolemia 07/16/2020   Peripheral artery disease (HCC)    COPD exacerbation (HCC) 05/14/2019   DDD (degenerative disc disease), cervical 11/06/2018   Cervical spinal stenosis 11/06/2018   Generalized anxiety disorder 11/29/2017   Benign prostatic hyperplasia with incomplete bladder emptying 11/28/2017   Gastroesophageal reflux disease without esophagitis 09/07/2017   Essential hypertension 09/01/2017   Onychomycosis 09/01/2017   Atrial fibrillation (HCC) 09/01/2017   Type 2 diabetes mellitus with diabetic neuropathy, with long-term current use of insulin (HCC) 06/19/2014   Back pain 06/19/2014   Neck pain 06/19/2014    Past Surgical History:  Procedure Laterality Date   AMPUTATION TOE     KNEE SURGERY Left    NOSE SURGERY          Family History  Problem Relation Age of Onset   Stroke Mother    Heart disease Mother        CABG   Cancer Father     Social History   Tobacco Use   Smoking status: Former    Packs/day: 1.00    Types: Cigarettes    Start date: 06/19/1966    Quit date: 06/20/2011    Years since quitting: 9.8   Smokeless tobacco: Never  Vaping Use   Vaping Use: Never used  Substance Use Topics   Alcohol use: No    Comment: quit drinking in 1982   Drug use: Not Currently    Comment: marijuana and cocaine in the past, quit in 2012    Home Medications Prior to Admission medications   Medication Sig Start Date End Date Taking? Authorizing Provider  alendronate (FOSAMAX) 70 MG tablet TAKE 1 TABLET BY MOUTH ONCE A WEEK. TAKE WITH A FULL GLASS OF WATER ON AN EMPTY STOMACH 01/28/21   Daphine Deutscher, [name], FNP  atorvastatin (LIPITOR) 40 MG tablet Take 1 tablet (40 mg total) by mouth daily. 01/30/21   Daphine Deutscher, [name], FNP  benazepril (LOTENSIN) 5 MG tablet TAKE 1 TABLET BY MOUTH EVERY DAY 03/02/21   Mechele Claude, MD  cetirizine (ZYRTEC) 10 MG tablet Take 10 mg by mouth daily.  01/12/20   [provider]  fenofibrate (TRICOR) 145 MG tablet Take 1 tablet (145 mg total) by mouth daily. 01/28/21  Daphine Deutscher, [name], FNP  gabapentin (NEURONTIN) 300 MG capsule Take 1 capsule (300 mg total) by mouth 4 (four) times daily. TAKE 1 CAPSULE BY MOUTH THREE TIMES DAILY FOR PAIN 01/28/21   [name], FNP  glucose blood (ACCU-CHEK AVIVA PLUS) test strip USE TO check blood glucose 4 times daily 05/02/20   [name], FNP  HYDROcodone-acetaminophen (NORCO/VICODIN) 5-325 MG tablet Take 1 tablet by mouth every 4 (four) hours as needed. 07/04/20   [provider]  insulin regular (HUMULIN R) 100 units/mL injection INJECT 10 UNITS INTO THE SKIN THREE TIMES DAILY BEFORE MEALS 01/28/21   Daphine Deutscher, [name], FNP  Insulin Syringe-Needle U-100 (GLOBAL INJECT EASE INSULIN SYR)  31G X 5/16" 0.3 ML MISC USE WITH INSULIN EVERY DAY Dx E11.40 01/07/21   Dettinger, Elige Radon, MD  metFORMIN (GLUCOPHAGE) 500 MG tablet Take 1 tablet (500 mg total) by mouth 2 (two) times daily. 01/28/21   Daphine Deutscher [name], FNP  metoprolol succinate (TOPROL-XL) 50 MG 24 hr tablet TAKE 1 TABLET BY MOUTH DAILY, TAKE WITH FOOD OR IMMEDIATELY FOLLOWING A MEAL 01/28/21   Daphine Deutscher, [name], FNP  Multiple Vitamin (MULTIVITAMIN) tablet Take 1 tablet by mouth daily.    [provider]  mupirocin ointment (BACTROBAN) 2 % APPLY TO LEFT GREAT TOE 01/10/20   [provider]  omeprazole (PRILOSEC) 20 MG capsule Take 1 capsule (20 mg total) by mouth daily. 01/28/21   Daphine Deutscher [name], FNP  tamsulosin (FLOMAX) 0.4 MG CAPS capsule Take 1 capsule (0.4 mg total) by mouth daily. 01/28/21   Daphine Deutscher [name], FNP  triamcinolone cream (KENALOG) 0.5 % APPLY TO THE AFFECTED AREA(S) THREE TIMES DAILY 01/12/20   [provider]  vitamin B-12 (CYANOCOBALAMIN) 500 MCG tablet Take 500 mcg by mouth daily.    [provider]  XARELTO 20 MG TABS tablet Take 1 tablet (20 mg total) by mouth daily. 01/28/21   [name], FNP    Allergies    Asa [aspirin] and Penicillins  Review of Systems   Review of Systems  Constitutional:  Negative for appetite change and fatigue.  HENT:  Negative for congestion, ear discharge and sinus pressure.   Eyes:  Negative for discharge.  Respiratory:  Negative for cough.   Cardiovascular:  Negative for chest pain.  Gastrointestinal:  Negative for abdominal pain and diarrhea.  Genitourinary:  Positive for flank pain. Negative for frequency and hematuria.  Musculoskeletal:  Negative for back pain.       Plaque left great toe  Skin:  Negative for rash.  Neurological:  Negative for seizures and headaches.  Psychiatric/Behavioral:  Negative for hallucinations.    Physical Exam Updated Vital Signs BP 133/74   Pulse (!) 113   Temp 98.6 F (37  C) (Oral)   Resp (!) 36   Ht 6' (1.829 m)   Wt 108.9 kg   SpO2 98%   BMI 32.55 kg/m   Physical Exam Vitals and nursing note reviewed.  Constitutional:      Appearance: He is well-developed.  HENT:     Head: Normocephalic.     Nose: Nose normal.  Eyes:     General: No scleral icterus.    Conjunctiva/sclera: Conjunctivae normal.  Neck:     Thyroid: No thyromegaly.  Cardiovascular:     Rate and Rhythm: Normal rate and regular rhythm.     Heart sounds: No murmur heard.   No friction rub. No gallop.  Pulmonary:     Breath sounds: No stridor. No wheezing or rales.  Chest:     Chest wall: No tenderness.  Abdominal:     General: There is no distension.     Tenderness: There is no abdominal tenderness. There is no rebound.  Genitourinary:    Comments: Tenderness to left flank Musculoskeletal:        General: Normal range of motion.     Cervical back: Neck supple.     Comments: Cellulitis left foot with gangrenous left toe  Lymphadenopathy:     Cervical: No cervical adenopathy.  Skin:    Findings: No erythema or rash.  Neurological:     Mental Status: He is alert and oriented to person, place, and time.     Motor: No abnormal muscle tone.     Coordination: Coordination normal.  Psychiatric:        Behavior: Behavior normal.    ED Results / Procedures / Treatments   Labs (all labs ordered are listed, but only abnormal results are displayed) Labs Reviewed  LACTIC ACID, PLASMA - Abnormal; Notable for the following components:      Result Value   Lactic Acid, Venous 2.2 (*)    All other components within normal limits  COMPREHENSIVE METABOLIC PANEL - Abnormal; Notable for the following components:   Sodium 127 (*)    Chloride 96 (*)    Glucose, Bld 333 (*)    BUN 27 (*)    Creatinine, Ser 1.89 (*)    Calcium 8.7 (*)    Albumin 3.1 (*)    GFR, Estimated 37 (*)    All other components within normal limits  CBC WITH DIFFERENTIAL/PLATELET - Abnormal; Notable for the  following components:   WBC 14.3 (*)    RBC 3.89 (*)    Hemoglobin 12.4 (*)    HCT 35.7 (*)    Neutro Abs 12.0 (*)    Abs Immature Granulocytes 0.09 (*)    All other components within normal limits  PROTIME-INR - Abnormal; Notable for the following components:   Prothrombin Time 17.0 (*)    INR 1.4 (*)    All other components within normal limits  URINALYSIS, ROUTINE W REFLEX MICROSCOPIC - Abnormal; Notable for the following components:   APPearance HAZY (*)    Glucose, UA >=500 (*)    Hgb urine dipstick MODERATE (*)    Protein, ur 30 (*)    All other components within normal limits  CULTURE, BLOOD (SINGLE)  URINE CULTURE  RESP PANEL BY RT-PCR (FLU A&B, COVID) [id number]  LACTIC ACID, PLASMA  APTT    EKG None  Radiology DG Chest Port 1 View  Result Date: 04/24/2021 CLINICAL DATA:  Left flank pain. Possible sepsis. History of diabetes, hypertension and atrial fibrillation. EXAM: PORTABLE CHEST 1 VIEW COMPARISON:  Radiographs 04/21/2021 and 07/03/2020. FINDINGS: 1137 hours. The heart size and mediastinal contours are stable with aortic atherosclerosis. The lungs appear clear. There is no pleural effusion or pneumothorax. No acute osseous findings are evident. There are degenerative changes in the spine associated with a mild scoliosis. Telemetry leads overlie the chest. IMPRESSION: Stable chest.  No acute cardiopulmonary process. Electronically Signed   By: Carey BullocksWilliam  Veazey M.D.   On: 04/24/2021 12:20   DG Foot Complete Left  Result Date: 04/24/2021 CLINICAL DATA:  Left flank pain. Left great toe erythema. Question osteomyelitis. EXAM: LEFT FOOT - COMPLETE 3+ VIEW COMPARISON:  Reports from left foot radiographs 03/20/2019 and 03/21/2019. 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 The distal 2nd phalanx appears eroded without acute cortical  destruction. There is soft tissue swelling and soft tissue emphysema of the great toe. There is possible cortical destruction involving the medial base of the distal 1st phalanx. Moderate degenerative changes are present at the 1st metatarsophalangeal joint. IMPRESSION: 1. Prior studies are unavailable for direct comparison. 2. Soft tissue swelling and emphysema within the great toe consistent with soft tissue infection. Mild irregularity of the medial base of the distal phalanx could reflect early osteomyelitis. Consider further evaluation with MRI. 3. 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 Electronically Signed   By: Carey Bullocks M.D.   On: 04/24/2021 12:24   CT Renal Stone Study  Result Date: 04/24/2021 CLINICAL DATA:  Flank pain, kidney stones suspected; left flank pain EXAM: CT ABDOMEN AND PELVIS WITHOUT CONTRAST TECHNIQUE: Multidetector CT imaging of the abdomen and pelvis was performed following the standard protocol without IV contrast. COMPARISON:  None. FINDINGS: Lower chest: No acute abnormality. Hepatobiliary: No focal liver abnormality is seen. No gallstones, gallbladder wall thickening, or biliary dilatation. Pancreas: Unremarkable. Spleen: Unremarkable. Adrenals/Urinary Tract: Adrenals are unremarkable. No renal calculi or hydronephrosis. Ureters are normal in caliber. Bladder is unremarkable. Stomach/Bowel: Stomach is within normal limits. Bowel is normal in caliber. Normal appendix. Vascular/Lymphatic: Extensive atherosclerosis. No enlarged lymph nodes. Reproductive: Unremarkable. Other: No free fluid. No significant abnormality of the abdominal wall. Musculoskeletal: Degenerative changes of the included spine. IMPRESSION: No acute abnormality.  No urinary tract calculi or hydronephrosis. Extensive atherosclerosis. Electronically Signed   By: Guadlupe Spanish M.D.   On: 04/24/2021 11:32     Procedures Procedures   Medications Ordered in ED Medications  ciprofloxacin (CIPRO) IVPB 400 mg (400 mg Intravenous New Bag/Given 04/24/21 1229)  sodium chloride 0.9 % bolus 500 mL (0 mLs Intravenous Stopped 04/24/21 1226)  clindamycin (CLEOCIN) IVPB 600 mg (0 mg Intravenous Stopped 04/24/21 1226)    ED Course  I have reviewed the triage vital signs and the nursing notes.  Pertinent labs & imaging results that were available during my care of the patient were reviewed by me and considered in my medical decision making (see chart for details).    MDM Rules/Calculators/A&P                           Patient with cellulitis left foot and necrotic left toe.  He will be admitted to medicine with surgery consult Final Clinical Impression(s) / ED Diagnoses Final diagnoses:  MI, acute, non ST segment elevation (HCC)  Cellulitis and abscess of foot    Rx / DC Orders ED Discharge Orders     None        Bethann Berkshire, MD 04/26/21 [phone]
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
## 2021-04-24 NOTE — Discharge Instructions (Addendum)
 [facility]  Discharge instructions  Lower Extremity Bypass Surgery  Please refer to the following instruction for your post-procedure care. Your surgeon or physician assistant will discuss any changes with you.  Activity  You are encouraged to walk as much as you can. You can slowly return to normal activities during the month after your surgery. Avoid strenuous activity and heavy lifting until your doctor tells you it's OK. Avoid activities such as vacuuming or swinging a golf club. Do not drive until your doctor give the OK and you are no longer taking prescription pain medications. It is also normal to have difficulty with sleep habits, eating and bowel movement after surgery. These will go away with time.  Bathing/Showering  Shower daily after you go home. Do not soak in a bathtub, hot tub, or swim until the incision heals completely.  Incision Care  Clean your incision with mild soap and water. Shower every day. Pat the area dry with a clean towel. You do not need a bandage unless otherwise instructed. Do not apply any ointments or creams to your incision. If you have open wounds you will be instructed how to care for them or a visiting nurse may be arranged for you. If you have staples or sutures along your incision they will be removed at your post-op appointment. You may have skin glue on your incision. Do not peel it off. It will come off on its own in about one week.  Wash the groin wound with soap and water daily and pat dry. (No tub bath-only shower)  Then put a dry gauze or washcloth in the groin to keep this area dry to help prevent wound infection.  Do this daily and as needed.  Do not use Vaseline or neosporin on your incisions.  Only use soap and water on your incisions and then protect and keep dry.  Diet  Resume your normal diet. There are no special food restrictions following this procedure. A low fat/ low cholesterol diet is  recommended for all patients with vascular disease. In order to heal from your surgery, it is CRITICAL to get adequate nutrition. Your body requires vitamins, minerals, and protein. Vegetables are the best source of vitamins and minerals. Vegetables also provide the perfect balance of protein. Processed food has little nutritional value, so try to avoid this.  Medications  Resume taking all your medications unless your doctor or physician assistant tells you not to. If your incision is causing pain, you may take over-the-counter pain relievers such as acetaminophen (Tylenol). If you were prescribed a stronger pain medication, please aware these medication can cause nausea and constipation. Prevent nausea by taking the medication with a snack or meal. Avoid constipation by drinking plenty of fluids and eating foods with high amount of fiber, such as fruits, vegetables, and grains. Take Colace 100 mg (an over-the-counter stool softener) twice a day as needed for constipation.  Do not take Tylenol if you are taking prescription pain medications.  Follow Up  Our office will schedule a follow up appointment 2-3 weeks following discharge.  Please call us immediately for any of the following conditions  Severe or worsening pain in your legs or feet while at rest or while walking Increase pain, redness, warmth, or drainage (pus) from your incision site(s) Fever of 101 degree or higher The swelling in your leg with the bypass suddenly worsens and becomes more painful than when you were in the hospital If you have   been instructed to feel your graft pulse then you should do so every day. If you can no longer feel this pulse, call the office immediately. Not all patients are given this instruction.  Leg swelling is common after leg bypass surgery.  The swelling should improve over a few months following surgery. To improve the swelling, you may elevate your legs above the level of your heart while you are  sitting or resting. Your surgeon or physician assistant may ask you to apply an ACE wrap or wear compression (TED) stockings to help to reduce swelling.  Reduce your risk of vascular disease  Stop smoking. If you would like help call [facility] at 1-800-QUIT-NOW (1-800-784-8669) or [facility] at 336-586-4000.  Manage your cholesterol Maintain a desired weight Control your diabetes weight Control your diabetes Keep your blood pressure down  If you have any questions, please call the office at [phone]  

## 2021-04-24 NOTE — Patient Instructions (Signed)
Visit Information  PATIENT GOALS:  Goals Addressed             This Visit's Progress    Diabetic Foot Care/Wound Care   Not on track    Timeframe:  Long-Range Goal Priority:  High Start Date:                             Expected End Date:                       Follow Up Date 04/27/21   keep feet up while sitting wash and dry feet carefully every day wear comfortable, cotton socks wear comfortable, well-fitting shoes  Schedule follow-up with [name] Change dressing daily and as needed if soiled or wet DO NOT USE alcohol or peroxide to clean the wound Use saline or water and a gentle soap to clean the wound daily  Do not soak in water Apply vaseline or the ointment provided by your doctor to the wound daily and cover with gauze Take note of any signs of infections: redness, swelling, drainage, odor, increased pain Call PCP or [name] with any new or worsening symptoms   Why is this important?   Good foot care is very important when you have diabetes.  There are many things you can do to keep your feet healthy and catch a problem early.    Notes:      Track and Manage My Symptoms-COPD   On track    Timeframe:  Long-Range Goal Priority:  Medium Start Date:                             Expected End Date:                       Follow Up Date 04/27/21    Keep appointment with PCP Call PCP with any new or worsening symptoms Seek emergency medical attention if needed Take medications as prescribed Rest as needed Talk with RN Care Manager about personal care services Call RN Care Manager as needed [phone]    Why is this important?   Tracking your symptoms and other information about your health helps your doctor plan your care.  Write down the symptoms, the time of day, what you were doing and what medicine you are taking.  You will soon learn how to manage your symptoms.     Notes:         The patient verbalized understanding of instructions, educational  materials, and care plan provided today and declined offer to receive copy of patient instructions, educational materials, and care plan.   Telephone follow up appointment with care management team member scheduled for: 04/27/21 with RNCM  [name], BSN, RN-BC Embedded Chronic Care Manager [facility] Care Management Direct Dial: [phone]

## 2021-04-24 NOTE — H&P (Signed)
History and Physical  FREDICK SCHLOSSER TMH:[id number] DOB: Mar 11, 1946 DOA: 04/24/2021   PCP: [name], FNP   Patient coming from: Home  Chief Complaint: left flank pain and left foot pain  HPI:  Todd Zhang is a 75 y.o. male with medical history of diabetes mellitus, hypertension, hyperlipidemia, COPD, anxiety, GERD presenting with left flank pain of 1 day duration and 2-week history of left foot pain.  Unfortunately, the patient is a difficult historian at best.  History is obtained primarily from review of the medical record and with some history from the patient.  According to the patient, he has had 3 or 4 mechanical falls in the past week secondary to gait instability.  Apparently, the patient visited the emergency department at [facility] on 04/21/21 when the patient found the patient to be driving erratically on the road.  Apparently the patient was somewhat confused.  In the emergency department, the patient was afebrile with mild tachycardia, HR 115, BP 108/59.  Patient was given normal saline with improvement of his heart rate and blood pressure.  BMP on 04/21/2021 showed sodium 133, potassium 4.4, serum creatinine 2.44.  LFTs were unremarkable.  WBC 11.1, hemoglobin 13.7, platelets 222,000.  Lactic acid was 0.7.  CT of the brain was negative.  Chest x-ray was negative.  UA was negative for pyuria.  After fluid resuscitation, the patient was improved clinically.  He was discharged home in stable condition.  Apparently, the patient called EMS because of the above chief complaints.  He stated that he tried to call EMS on 04/23/2021, but he had a mechanical fall resulting in breaking his cell phone.  It is unclear exactly how EMS was activated on the day of this admission.  Nevertheless, the patient states that he has noted increasing pain, edema, and erythema of his left foot in the past 2 weeks.  However, he states that he has had left foot pain and edema for the better part of at least 2  months.  Again, the patient is difficult historian.  He does not recall how he initially injured his foot.  He has been feeling palpitations and fluttering in his chest, but he denies any frank chest pain, shortness breath, coughing, hemoptysis, nausea, vomiting, direct abdominal pain, dysuria, hematuria.  He denies any syncope, but has had 3-4 mechanical falls.  He denies any headache, visual disturbance, focal extremity weakness. In the emergency department, the patient was afebrile but was tachycardic 110-120.  He was hemodynamically stable.  Oxygen saturation 99-100% room air.  BMP showed sodium 127, potassium 4.4, serum creatinine 1.9.  WBC 14.3, hemoglobin 12.4, platelets 1 88,000.  Lactic acid peaked at 2.2.  CT renal stone protocol was negative for any acute findings or hydronephrosis.  Chest x-ray was negative for any infiltrates or edema.  X-ray of the left foot showed soft tissue swelling and emphysema with the great toe with mild irregularity of the medial base of the distal phalanx.  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  The patient was started on meropenem.  Fluid boluses were given.  Assessment/Plan: Sepsis -Present on admission -presented with leukocytosis, tachycardia, tachypnea -Secondary to diabetic foot infection -Give 2 L LR -Lactic acid peaked 2.2  Diabetic foot infection -X-ray left foot as discussed above -MRI left foot -General surgery consult -The patient will likely need amputation of his left great toe -See pictures below  Diabetes mellitus type 2 uncontrolled with hyperglycemia -Check hemoglobin A1c -NovoLog sliding  scale -Holding metformin  Essential hypertension -Restart metoprolol succinate -Holding benazepril  Atrial fibrillation with RVR -Suspect increased rate secondary to sepsis as well and has rebound tachycardia from not getting metoprolol -Holding rivaroxaban in anticipation of surgery  CKD 3b -baseline creatinine  1.7-2.0  Hyperlipidemia -Continue statin and fenofibrate  Left flank pain -CT renal stone protocol negative for acute findings -Suspect this is musculoskeletal strain/sprain from the patient's frequent falls -Judicious opioids  Hyponatremia -Secondary to volume depletion and hyperglycemia -Start normal saline      Past Medical History:  Diagnosis Date   Atrial fibrillation (HCC)    Cervical spinal stenosis    DDD (degenerative disc disease), cervical    DDD (degenerative disc disease), lumbar    Diabetes mellitus without complication (HCC)    Hyperlipidemia    Hypertension    Peripheral artery disease (HCC)    Past Surgical History:  Procedure Laterality Date   AMPUTATION TOE     KNEE SURGERY Left    NOSE SURGERY     Social History:  reports that he quit smoking about 9 years ago. His smoking use included cigarettes. He started smoking about 54 years ago. He smoked an average of 1 pack per day. He has never used smokeless tobacco. He reports that he does not currently use drugs. He reports that he does not drink alcohol.   Family History  Problem Relation Age of Onset   Stroke Mother    Heart disease Mother        CABG   Cancer Father      Allergies  Allergen Reactions   Asa [Aspirin] Hives   Penicillins Hives     Prior to Admission medications   Medication Sig Start Date End Date Taking? Authorizing Provider  alendronate (FOSAMAX) 70 MG tablet TAKE 1 TABLET BY MOUTH ONCE A WEEK. TAKE WITH A FULL GLASS OF WATER ON AN EMPTY STOMACH 01/28/21   Daphine Deutscher, [name], FNP  atorvastatin (LIPITOR) 40 MG tablet Take 1 tablet (40 mg total) by mouth daily. 01/30/21   Daphine Deutscher, [name], FNP  benazepril (LOTENSIN) 5 MG tablet TAKE 1 TABLET BY MOUTH EVERY DAY 03/02/21   Mechele Claude, MD  cetirizine (ZYRTEC) 10 MG tablet Take 10 mg by mouth daily.  01/12/20   [provider]  fenofibrate (TRICOR) 145 MG tablet Take 1 tablet (145 mg total) by mouth daily.  01/28/21   Daphine Deutscher, [name], FNP  gabapentin (NEURONTIN) 300 MG capsule Take 1 capsule (300 mg total) by mouth 4 (four) times daily. TAKE 1 CAPSULE BY MOUTH THREE TIMES DAILY FOR PAIN 01/28/21   [name], FNP  glucose blood (ACCU-CHEK AVIVA PLUS) test strip USE TO check blood glucose 4 times daily 05/02/20   [name], FNP  HYDROcodone-acetaminophen (NORCO/VICODIN) 5-325 MG tablet Take 1 tablet by mouth every 4 (four) hours as needed. 07/04/20   [provider]  insulin regular (HUMULIN R) 100 units/mL injection INJECT 10 UNITS INTO THE SKIN THREE TIMES DAILY BEFORE MEALS 01/28/21   Daphine Deutscher, [name], FNP  Insulin Syringe-Needle U-100 (GLOBAL INJECT EASE INSULIN SYR) 31G X 5/16" 0.3 ML MISC USE WITH INSULIN EVERY DAY Dx E11.40 01/07/21   Dettinger, Elige Radon, MD  metFORMIN (GLUCOPHAGE) 500 MG tablet Take 1 tablet (500 mg total) by mouth 2 (two) times daily. 01/28/21   Daphine Deutscher [name], FNP  metoprolol succinate (TOPROL-XL) 50 MG 24 hr tablet TAKE 1 TABLET BY MOUTH DAILY, TAKE WITH FOOD OR IMMEDIATELY FOLLOWING A MEAL 01/28/21   [name], FNP  Multiple Vitamin (MULTIVITAMIN) tablet Take 1 tablet by mouth daily.    [provider]  mupirocin ointment (BACTROBAN) 2 % APPLY TO LEFT GREAT TOE 01/10/20   [provider]  omeprazole (PRILOSEC) 20 MG capsule Take 1 capsule (20 mg total) by mouth daily. 01/28/21   Daphine Deutscher [name], FNP  tamsulosin (FLOMAX) 0.4 MG CAPS capsule Take 1 capsule (0.4 mg total) by mouth daily. 01/28/21   Daphine Deutscher [name], FNP  triamcinolone cream (KENALOG) 0.5 % APPLY TO THE AFFECTED AREA(S) THREE TIMES DAILY 01/12/20   [provider]  vitamin B-12 (CYANOCOBALAMIN) 500 MCG tablet Take 500 mcg by mouth daily.    [provider]  XARELTO 20 MG TABS tablet Take 1 tablet (20 mg total) by mouth daily. 01/28/21   [name], FNP    Review of Systems:  Constitutional:  No weight  loss, night sweats,  Head&Eyes: No headache.  No vision loss.  No eye pain or scotoma ENT:  No Difficulty swallowing,Tooth/dental problems,Sore throat,  No ear ache, post nasal drip,  Cardio-vascular:  No chest pain, Orthopnea, PND, swelling in lower extremities,  dizziness; he complains of palpitations GI:  No  abdominal pain, nausea, vomiting, diarrhea, loss of appetite, hematochezia, melena, heartburn, indigestion, Resp:  No shortness of breath with exertion or at rest. No cough. No coughing up of blood .No wheezing.No chest wall deformity  Skin:  no rash or lesions.  GU:  no dysuria, change in color of urine, no urgency or frequency. No flank pain.  Musculoskeletal:  Left foot pain Psych:  No change in mood or affect. No depression or anxiety. Neurologic: No headache, no dysesthesia, no focal weakness, no vision loss. No syncope  Physical Exam: Vitals:   04/24/21 1130 04/24/21 1200 04/24/21 1230 04/24/21 1237  BP: (!) 142/85 137/76 133/74   Pulse: (!) 106 83 (!) 113   Resp: (!) 29  (!) 36   Temp:    98.6 F (37 C)  TempSrc:    Oral  SpO2: 100%  98%   Weight:      Height:       General:  A&O x 3, NAD, nontoxic, pleasant/cooperative Head/Eye: No conjunctival hemorrhage, no icterus, [place]/AT, No nystagmus ENT:  No icterus,  No thrush, good dentition, no pharyngeal exudate Neck:  No masses, no lymphadenpathy, no bruits CV:  IRRR, no rub, no gallop, no S3 Lung:  CTAB, good air movement, no wheeze, no rhonchi Abdomen: soft/NT, +BS, nondistended, no peritoneal signs Ext: See pictures of left foot below Neuro: CNII-XII intact, strength 4/5 in bilateral upper and lower extremities, no dysmetria         Labs on Admission:  Basic Metabolic Panel: Recent Labs  Lab 04/24/21 1041  NA 127*  K 4.4  CL 96*  CO2 24  GLUCOSE 333*  BUN 27*  CREATININE 1.89*  CALCIUM 8.7*   Liver Function Tests: Recent Labs  Lab 04/24/21 1041  AST 40  ALT 33  ALKPHOS 65  BILITOT  1.1  PROT 6.9  ALBUMIN 3.1*   No results for input(s): LIPASE, AMYLASE in the last 168 hours. No results for input(s): AMMONIA in the last 168 hours. CBC: Recent Labs  Lab 04/24/21 1041  WBC 14.3*  NEUTROABS 12.0*  HGB 12.4*  HCT 35.7*  MCV 91.8  PLT 188   Coagulation Profile: Recent Labs  Lab 04/24/21 1041  INR 1.4*   Cardiac Enzymes: No results for input(s): CKTOTAL, CKMB, CKMBINDEX, TROPONINI in the last 168 hours. BNP:  Invalid input(s): POCBNP CBG: No results for input(s): GLUCAP in the last 168 hours. Urine analysis:    Component Value Date/Time   COLORURINE YELLOW 04/24/2021 1029   APPEARANCEUR HAZY (A) 04/24/2021 1029   LABSPEC 1.018 04/24/2021 1029   PHURINE 5.0 04/24/2021 1029   GLUCOSEU >=500 (A) 04/24/2021 1029   HGBUR MODERATE (A) 04/24/2021 1029   BILIRUBINUR NEGATIVE 04/24/2021 1029   BILIRUBINUR neg 03/04/2020 1437   KETONESUR NEGATIVE 04/24/2021 1029   PROTEINUR 30 (A) 04/24/2021 1029   UROBILINOGEN negative (A) 03/04/2020 1437   NITRITE NEGATIVE 04/24/2021 1029   LEUKOCYTESUR NEGATIVE 04/24/2021 1029   Sepsis Labs: @LABRCNTIP (procalcitonin:4,lacticidven:4) ) Recent Results (from the past 240 hour(s))  Blood culture (routine single)     Status: None (Preliminary result)   Collection Time: 04/24/21 10:42 AM   Specimen: Right Antecubital; Blood  Result Value Ref Range Status   Specimen Description RIGHT ANTECUBITAL  Final   Special Requests   Final    BOTTLES DRAWN AEROBIC AND ANAEROBIC Blood Culture adequate volume Performed at [facility], [address] [place], [place] [id number]    Culture PENDING  Incomplete   Report Status PENDING  Incomplete     Radiological Exams on Admission: DG Chest Port 1 View  Result Date: 04/24/2021 CLINICAL DATA:  Left flank pain. Possible sepsis. History of diabetes, hypertension and atrial fibrillation. EXAM: PORTABLE CHEST 1 VIEW COMPARISON:  Radiographs 04/21/2021 and 07/03/2020. FINDINGS: 1137 hours.  The heart size and mediastinal contours are stable with aortic atherosclerosis. The lungs appear clear. There is no pleural effusion or pneumothorax. No acute osseous findings are evident. There are degenerative changes in the spine associated with a mild scoliosis. Telemetry leads overlie the chest. IMPRESSION: Stable chest.  No acute cardiopulmonary process. Electronically Signed   By: Carey BullocksWilliam  Veazey M.D.   On: 04/24/2021 12:20   DG Foot Complete Left  Result Date: 04/24/2021 CLINICAL DATA:  Left flank pain. Left great toe erythema. Question osteomyelitis. EXAM: LEFT FOOT - COMPLETE 3+ VIEW COMPARISON:  Reports from left foot radiographs 03/20/2019 and 03/21/2019. 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 The distal 2nd phalanx appears eroded without acute cortical destruction. There is soft tissue swelling and soft tissue emphysema of the great toe. There is possible cortical destruction involving the medial base of the distal 1st phalanx. Moderate degenerative changes are present at the 1st metatarsophalangeal joint. IMPRESSION: 1. Prior studies are unavailable for direct comparison. 2. Soft tissue swelling and emphysema within the great toe consistent with soft tissue infection. Mild irregularity of the medial base of the distal phalanx could reflect early osteomyelitis. Consider further evaluation with MRI. 3. 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 Electronically Signed   By: Carey BullocksWilliam  Veazey M.D.   On: 04/24/2021 12:24   CT Renal Stone Study  Result Date: 04/24/2021 CLINICAL DATA:  Flank pain, kidney stones suspected; left flank pain EXAM: CT ABDOMEN AND PELVIS WITHOUT CONTRAST TECHNIQUE: Multidetector CT imaging of the abdomen and pelvis was  performed following the standard protocol without IV contrast. COMPARISON:  None. FINDINGS: Lower chest: No acute abnormality. Hepatobiliary: No focal liver abnormality is seen. No gallstones, gallbladder wall thickening, or biliary dilatation. Pancreas: Unremarkable. Spleen: Unremarkable. Adrenals/Urinary Tract: Adrenals are unremarkable. No renal calculi or hydronephrosis. Ureters are normal in caliber. Bladder  is unremarkable. Stomach/Bowel: Stomach is within normal limits. Bowel is normal in caliber. Normal appendix. Vascular/Lymphatic: Extensive atherosclerosis. No enlarged lymph nodes. Reproductive: Unremarkable. Other: No free fluid. No significant abnormality of the abdominal wall. Musculoskeletal: Degenerative changes of the included spine. IMPRESSION: No acute abnormality.  No urinary tract calculi or hydronephrosis. Extensive atherosclerosis. Electronically Signed   By: Guadlupe Spanish M.D.   On: 04/24/2021 11:32    EKG: Independently reviewed. Afib RVR rate 117, nonspecific T wave change    Time spent:60 minutes Code Status:   FULL Family Communication:  No Family at bedside Disposition Plan: expect 2-3 day hospitalization Consults called: general surgery DVT Prophylaxis: [place] Heparin     Catarina Hartshorn, DO  [facility] Pager [phone]  If 7PM-7AM, please contact night-coverage [email or web address] Password [facility] 04/24/2021, 1:42 PM

## 2021-04-25 ENCOUNTER — Inpatient Hospital Stay (HOSPITAL_COMMUNITY): Payer: Medicare Other

## 2021-04-25 DIAGNOSIS — L03119 Cellulitis of unspecified part of limb: Secondary | ICD-10-CM

## 2021-04-25 DIAGNOSIS — N1832 Chronic kidney disease, stage 3b: Secondary | ICD-10-CM | POA: Diagnosis not present

## 2021-04-25 DIAGNOSIS — L02619 Cutaneous abscess of unspecified foot: Secondary | ICD-10-CM

## 2021-04-25 DIAGNOSIS — I4891 Unspecified atrial fibrillation: Secondary | ICD-10-CM | POA: Diagnosis not present

## 2021-04-25 DIAGNOSIS — E11628 Type 2 diabetes mellitus with other skin complications: Secondary | ICD-10-CM | POA: Diagnosis not present

## 2021-04-25 DIAGNOSIS — M86172 Other acute osteomyelitis, left ankle and foot: Secondary | ICD-10-CM

## 2021-04-25 DIAGNOSIS — E1165 Type 2 diabetes mellitus with hyperglycemia: Secondary | ICD-10-CM

## 2021-04-25 LAB — BLOOD CULTURE ID PANEL (REFLEXED) - BCID2

## 2021-04-25 LAB — CBC
HCT: 31.5 % — ABNORMAL LOW (ref 39.0–52.0)
Hemoglobin: 10.4 g/dL — ABNORMAL LOW (ref 13.0–17.0)
MCH: 31.2 pg (ref 26.0–34.0)
MCHC: 33 g/dL (ref 30.0–36.0)
MCV: 94.6 fL (ref 80.0–100.0)
Platelets: 172 10*3/uL (ref 150–400)
RBC: 3.33 MIL/uL — ABNORMAL LOW (ref 4.22–5.81)
RDW: 12.8 % (ref 11.5–15.5)
WBC: 12.6 10*3/uL — ABNORMAL HIGH (ref 4.0–10.5)
nRBC: 0 % (ref 0.0–0.2)

## 2021-04-25 LAB — BASIC METABOLIC PANEL
Anion gap: 5 (ref 5–15)
BUN: 24 mg/dL — ABNORMAL HIGH (ref 8–23)
CO2: 24 mmol/L (ref 22–32)
Calcium: 8.4 mg/dL — ABNORMAL LOW (ref 8.9–10.3)
Chloride: 104 mmol/L (ref 98–111)
Creatinine, Ser: 1.79 mg/dL — ABNORMAL HIGH (ref 0.61–1.24)
GFR, Estimated: 39 mL/min — ABNORMAL LOW (ref >60)
Glucose, Bld: 181 mg/dL — ABNORMAL HIGH (ref 70–99)
Potassium: 4.3 mmol/L (ref 3.5–5.1)
Sodium: 133 mmol/L — ABNORMAL LOW (ref 135–145)

## 2021-04-25 LAB — URINE CULTURE: Culture: 10000 — AB

## 2021-04-25 LAB — LACTIC ACID, PLASMA: Lactic Acid, Venous: 0.8 mmol/L (ref 0.5–1.9)

## 2021-04-25 LAB — GLUCOSE, CAPILLARY
Glucose-Capillary: 154 mg/dL — ABNORMAL HIGH (ref 70–99)
Glucose-Capillary: 182 mg/dL — ABNORMAL HIGH (ref 70–99)
Glucose-Capillary: 156 mg/dL — ABNORMAL HIGH (ref 70–99)
Glucose-Capillary: 174 mg/dL — ABNORMAL HIGH (ref 70–99)

## 2021-04-25 LAB — C-REACTIVE PROTEIN: CRP: 32.4 mg/dL — ABNORMAL HIGH (ref <1.0)

## 2021-04-25 MED ORDER — LACTATED RINGERS IV BOLUS
1000.0000 mL | Freq: Once | INTRAVENOUS | Status: AC
  Administered 2021-04-25: 1000 mL via INTRAVENOUS

## 2021-04-25 MED ORDER — DILTIAZEM LOAD VIA INFUSION
15.0000 mg | Freq: Once | INTRAVENOUS | Status: AC
  Administered 2021-04-25: 15 mg via INTRAVENOUS
  Filled 2021-04-25: qty 15

## 2021-04-25 MED ORDER — CHLORHEXIDINE GLUCONATE CLOTH 2 % EX PADS
6.0000 | MEDICATED_PAD | Freq: Every day | CUTANEOUS | Status: DC
  Administered 2021-04-25 – 2021-04-30 (×6): 6 via TOPICAL

## 2021-04-25 MED ORDER — DILTIAZEM HCL-DEXTROSE 125-5 MG/125ML-% IV SOLN (PREMIX)
5.0000 mg/h | INTRAVENOUS | Status: DC
  Administered 2021-04-25: 5 mg/h via INTRAVENOUS
  Administered 2021-04-26: 15 mg/h via INTRAVENOUS
  Administered 2021-04-26: 10 mg/h via INTRAVENOUS
  Filled 2021-04-25 (×3): qty 125

## 2021-04-25 NOTE — Progress Notes (Signed)
Pt transferred to ICU, report called  pt transferred safely. Reva Bores 9:43 PM 04/25/21

## 2021-04-25 NOTE — Progress Notes (Signed)
Entered patient's room to assess and noted patient RR was 30. Obtained vitals and HR 125, temp 101.4, BP 117/64, RR 30, O2 93 on room air. Notified Dr. Arbutus Leas and administered tylenol for fever. Will continue to monitor closely and follow mews score guidelines.   Renford Dills 04/25/2021,6:31 PM

## 2021-04-25 NOTE — Progress Notes (Addendum)
PROGRESS NOTE  Todd Zhang VPC:[id number] DOB: 10/14/1945 DOA: 04/24/2021 PCP: [name], FNP  Brief History:   74 y.o. male with medical history of diabetes mellitus, hypertension, hyperlipidemia, COPD, anxiety, GERD presenting with left flank pain of 1 day duration and 2-week history of left foot pain.  Unfortunately, the patient is a difficult historian at best.   According to the patient, he has had 3 or 4 mechanical falls in the past week secondary to gait instability.  Apparently, the patient visited the emergency department at [facility] on 04/21/21 when the patient found the patient to be driving erratically on the road.  Apparently the patient was somewhat confused.  In the emergency department at [facility], the patient was afebrile with mild tachycardia, HR 115, BP 108/59.  Patient was given normal saline with improvement of his heart rate and blood pressure.  BMP on 04/21/2021 showed sodium 133, potassium 4.4, serum creatinine 2.44.  LFTs were unremarkable.  WBC 11.1, hemoglobin 13.7, platelets 222,000.  Lactic acid was 0.7.  CT of the brain was negative.  Chest x-ray was negative.  UA was negative for pyuria.  After fluid resuscitation, the patient was improved clinically.  He was discharged home in stable condition.   Apparently, the patient called EMS on 04/24/21 because of the above chief complaints.  He stated that he tried to call EMS on 04/23/2021, but he had a mechanical fall resulting in breaking his cell phone.  It is unclear exactly how EMS was activated on the day of this admission.  Nevertheless, the patient states that he has noted increasing pain, edema, and erythema of his left foot in the past 2 weeks.  However, he states that he has had left foot pain and edema for the better part of at least 2 months.  Again, the patient is difficult historian.  He does not recall how he initially injured his foot.  He has been feeling palpitations and fluttering in his chest, but he  denies any frank chest pain, shortness breath, coughing, hemoptysis, nausea, vomiting, direct abdominal pain, dysuria, hematuria.  He denies any syncope, but has had 3-4 mechanical falls.  He denies any headache, visual disturbance, focal extremity weakness. In the emergency department, the patient was afebrile but was tachycardic into 120s.  He was hemodynamically stable.  Oxygen saturation 99-100% room air.  BMP showed sodium 127, potassium 4.4, serum creatinine 1.9.  WBC 14.3, hemoglobin 12.4, platelets 1 88,000.  Lactic acid peaked at 2.2.  CT renal stone protocol was negative for any acute findings or hydronephrosis.  Chest x-ray was negative for any infiltrates or edema.  X-ray of the left foot showed soft tissue swelling and emphysema with the great toe with mild irregularity of the medial base of the distal phalanx.  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  The patient was started on meropenem.  Fluid boluses were given.  Assessment/Plan: Severe Sepsis -Present on admission -presented with leukocytosis, tachycardia, tachypnea -Secondary to diabetic foot infection and bacteremia -Given 2 L LR -started on NS -Lactic acid peaked 2.2 -continue vanc and merrem -continues to have fever up to 102.9   Diabetic foot infection/Acute osteomyelitis L-great toe -X-ray left foot as discussed above -MRI left foot--bone marrow edema of  LEFT1st distal and proximal phalanges -General surgery consult appreciated -discussed with Dr. Arnoldo Morale -The patient will likely need amputation of his left great toe -ESR-113 -CRP--32.4  Group B Streptococcus Bacteremia -source is  diabetic foot infection -repeat blood cultures -Echo   Diabetes mellitus type 2 uncontrolled with hyperglycemia -8/12 hemoglobin A1c--8.3 -NovoLog sliding scale -Holding metformin   Essential hypertension -Restart metoprolol succinate -Holding benazepril   Atrial fibrillation with RVR -Suspect increased rate secondary to  sepsis as well and has rebound tachycardia from not getting metoprolol -Holding rivaroxaban in anticipation of surgery   CKD 3b -baseline creatinine 1.7-2.0   Hyperlipidemia -Continue statin and fenofibrate   Left flank pain -CT renal stone protocol negative for acute findings -Suspect this is musculoskeletal strain/sprain from the patient's frequent falls -Judicious opioids   Hyponatremia -Secondary to volume depletion and hyperglycemia -Continue normal saline>>improving        Status is: Inpatient  Remains inpatient appropriate because:Inpatient level of care appropriate due to severity of illness  Dispo: The patient is from: Home              Anticipated d/c is to: Home              Patient currently is not medically stable to d/c.   Difficult to place patient No        Family Communication:  no Family at bedside  Consultants:  general surgery  Code Status:  FULL  DVT Prophylaxis:  Xarelto on hold   Procedures: As Listed in Progress Note Above  Antibiotics: Merrem 8/12>> Vanc 8/12>>      Subjective: Patient complains of some sob.  Denies cp, n/v/d, abd pain, headache  Objective: Vitals:   04/25/21 0549 04/25/21 1400 04/25/21 1600 04/25/21 1611  BP: (!) 131/59 134/62  117/64  Pulse: (!) 102 92  (!) 125  Resp:  18 (!) 30 (!) 30  Temp: 98.5 F (36.9 C) 98.2 F (36.8 C)  (!) 101.4 F (38.6 C)  TempSrc: Oral Oral  Oral  SpO2: 94% 94%  93%  Weight:      Height:        Intake/Output Summary (Last 24 hours) at 04/25/2021 1628 Last data filed at 04/25/2021 0900 Gross per 24 hour  Intake 7458.96 ml  Output 190 ml  Net 7268.96 ml   Weight change:  Exam:  General:  Pt is alert, follows commands appropriately, not in acute distress HEENT: No icterus, No thrush, No neck mass, [place]/AT Cardiovascular: RRR, S1/S2, no rubs, no gallops Respiratory: bibasilar crackles. No wheeze Abdomen: Soft/+BS, non tender, non distended, no  guarding Extremities: left great toe edema, erythema with necrotic tissue on plantar surface   Data Reviewed: I have personally reviewed following labs and imaging studies Basic Metabolic Panel: Recent Labs  Lab 04/24/21 1041 04/25/21 0558  NA 127* 133*  K 4.4 4.3  CL 96* 104  CO2 24 24  GLUCOSE 333* 181*  BUN 27* 24*  CREATININE 1.89* 1.79*  CALCIUM 8.7* 8.4*   Liver Function Tests: Recent Labs  Lab 04/24/21 1041  AST 40  ALT 33  ALKPHOS 65  BILITOT 1.1  PROT 6.9  ALBUMIN 3.1*   No results for input(s): LIPASE, AMYLASE in the last 168 hours. No results for input(s): AMMONIA in the last 168 hours. Coagulation Profile: Recent Labs  Lab 04/24/21 1041  INR 1.4*   CBC: Recent Labs  Lab 04/24/21 1041 04/25/21 0558  WBC 14.3* 12.6*  NEUTROABS 12.0*  --   HGB 12.4* 10.4*  HCT 35.7* 31.5*  MCV 91.8 94.6  PLT 188 172   Cardiac Enzymes: No results for input(s): CKTOTAL, CKMB, CKMBINDEX, TROPONINI in the last 168 hours. BNP: Invalid input(s):  POCBNP CBG: Recent Labs  Lab 04/24/21 2118 04/25/21 0720 04/25/21 1117 04/25/21 1613  GLUCAP 175* 154* 182* 156*   HbA1C: Recent Labs    04/24/21 1414  HGBA1C 8.3*   Urine analysis:    Component Value Date/Time   COLORURINE YELLOW 04/24/2021 1029   APPEARANCEUR HAZY (A) 04/24/2021 1029   LABSPEC 1.018 04/24/2021 1029   PHURINE 5.0 04/24/2021 1029   GLUCOSEU >=500 (A) 04/24/2021 1029   HGBUR MODERATE (A) 04/24/2021 1029   BILIRUBINUR NEGATIVE 04/24/2021 1029   BILIRUBINUR neg 03/04/2020 1437   KETONESUR NEGATIVE 04/24/2021 1029   PROTEINUR 30 (A) 04/24/2021 1029   UROBILINOGEN negative (A) 03/04/2020 1437   NITRITE NEGATIVE 04/24/2021 1029   LEUKOCYTESUR NEGATIVE 04/24/2021 1029   Sepsis Labs: '@LABRCNTIP' (procalcitonin:4,lacticidven:4) ) Recent Results (from the past 240 hour(s))  Blood culture (routine single)     Status: None (Preliminary result)   Collection Time: 04/24/21 10:42 AM   Specimen:  Right Antecubital; Blood  Result Value Ref Range Status   Specimen Description   Final    RIGHT ANTECUBITAL Performed at [facility], [address], [place], [place] [address]    Special Requests   Final    BOTTLES DRAWN AEROBIC AND ANAEROBIC Blood Culture adequate volume Performed at [facility] [address] [place], [place] [address]    Culture  Setup Time   Final    GRAM POSITIVE COCCI Gram Stain Report Called to,Read Back By and Verified With: BRAME,M @ 2048 04/24/21 BY STEPHTR IN BOTH AEROBIC AND ANAEROBIC BOTTLES CRITICAL RESULT CALLED TO, READ BACK BY AND VERIFIED WITH: RN N.MOODY AT 0146 ON 04/25/2021 BY T.SAAD. Performed at [facility], [place] [address], [place], [place] [address]    Culture GRAM POSITIVE COCCI  Final   Report Status PENDING  Incomplete  Blood Culture ID Panel (Reflexed)     Status: Abnormal   Collection Time: 04/24/21 10:42 AM  Result Value Ref Range Status   Enterococcus faecalis NOT DETECTED NOT DETECTED Final   Enterococcus Faecium NOT DETECTED NOT DETECTED Final   Listeria monocytogenes NOT DETECTED NOT DETECTED Final   Staphylococcus species NOT DETECTED NOT DETECTED Final   Staphylococcus aureus (BCID) NOT DETECTED NOT DETECTED Final   Staphylococcus epidermidis NOT DETECTED NOT DETECTED Final   Staphylococcus lugdunensis NOT DETECTED NOT DETECTED Final   Streptococcus species DETECTED (A) NOT DETECTED Final    Comment: CRITICAL RESULT CALLED TO, READ BACK BY AND VERIFIED WITH: RN N.MOODY AT 0146 ON 04/25/2021 BY T.SAAD.    Streptococcus agalactiae DETECTED (A) NOT DETECTED Final    Comment: CRITICAL RESULT CALLED TO, READ BACK BY AND VERIFIED WITH: RN N.MOODY AT 0146 ON 04/25/2021 BY T.SAAD.    Streptococcus pneumoniae NOT DETECTED NOT DETECTED Final   Streptococcus pyogenes NOT DETECTED NOT DETECTED Final   A.calcoaceticus-baumannii NOT DETECTED NOT DETECTED Final   Bacteroides fragilis NOT DETECTED NOT DETECTED Final    Enterobacterales NOT DETECTED NOT DETECTED Final   Enterobacter cloacae complex NOT DETECTED NOT DETECTED Final   Escherichia coli NOT DETECTED NOT DETECTED Final   Klebsiella aerogenes NOT DETECTED NOT DETECTED Final   Klebsiella oxytoca NOT DETECTED NOT DETECTED Final   Klebsiella pneumoniae NOT DETECTED NOT DETECTED Final   Proteus species NOT DETECTED NOT DETECTED Final   Salmonella species NOT DETECTED NOT DETECTED Final   Serratia marcescens NOT DETECTED NOT DETECTED Final   Haemophilus influenzae NOT DETECTED NOT DETECTED Final   Neisseria meningitidis NOT DETECTED NOT DETECTED Final  Pseudomonas aeruginosa NOT DETECTED NOT DETECTED Final   Stenotrophomonas maltophilia NOT DETECTED NOT DETECTED Final   Candida albicans NOT DETECTED NOT DETECTED Final   Candida auris NOT DETECTED NOT DETECTED Final   Candida glabrata NOT DETECTED NOT DETECTED Final   Candida krusei NOT DETECTED NOT DETECTED Final   Candida parapsilosis NOT DETECTED NOT DETECTED Final   Candida tropicalis NOT DETECTED NOT DETECTED Final   Cryptococcus neoformans/gattii NOT DETECTED NOT DETECTED Final    Comment: Performed at [facility], [place] [address], [place], [place] [address]  Urine Culture     Status: Abnormal   Collection Time: 04/24/21 11:20 AM   Specimen: In/Out Cath Urine  Result Value Ref Range Status   Specimen Description   Final    IN/OUT CATH URINE Performed at [facility], [address], [place], [place] [address]    Special Requests   Final    NONE Performed at [facility], [address], [place], [place] [address]    Culture (A)  Final    10,000 COLONIES/mL GROUP B STREP(S.AGALACTIAE)ISOLATED TESTING AGAINST S. AGALACTIAE NOT ROUTINELY PERFORMED DUE TO PREDICTABILITY OF AMP/PEN/VAN SUSCEPTIBILITY. Performed at [facility], [place] [address], [place], [place] [address]    Report Status 04/25/2021 FINAL  Final  Resp Panel by RT-PCR (Flu A&B, Covid) Nasopharyngeal Swab      Status: None   Collection Time: 04/24/21  1:15 PM   Specimen: Nasopharyngeal Swab; Nasopharyngeal(NP) swabs in vial transport medium  Result Value Ref Range Status   SARS Coronavirus 2 by RT PCR NEGATIVE NEGATIVE Final    Comment: (NOTE) SARS-CoV-2 target nucleic acids are NOT DETECTED.  The SARS-CoV-2 RNA is generally detectable in upper respiratory specimens during the acute phase of infection. The lowest concentration of SARS-CoV-2 viral copies this assay can detect is 138 copies/mL. A negative result does not preclude SARS-Cov-2 infection and should not be used as the sole basis for treatment or other patient management decisions. A negative result may occur with  improper specimen collection/handling, submission of specimen other than nasopharyngeal swab, presence of viral mutation(s) within the areas targeted by this assay, and inadequate number of viral copies(<138 copies/mL). A negative result must be combined with clinical observations, patient history, and epidemiological information. The expected result is Negative.  Fact Sheet for Patients:  EntrepreneurPulse.com.au  Fact Sheet for Healthcare Providers:  IncredibleEmployment.be  This test is no t yet approved or cleared by the [place] FDA and  has been authorized for detection and/or diagnosis of SARS-CoV-2 by FDA under an Emergency Use Authorization (EUA). This EUA will remain  in effect (meaning this test can be used) for the duration of the COVID-19 declaration under Section 564(b)(1) of the Act, 21 U.S.C.section 360bbb-3(b)(1), unless the authorization is terminated  or revoked sooner.       Influenza A by PCR NEGATIVE NEGATIVE Final   Influenza B by PCR NEGATIVE NEGATIVE Final    Comment: (NOTE) The Xpert Xpress SARS-CoV-2/FLU/RSV plus assay is intended as an aid in the diagnosis of influenza from Nasopharyngeal swab specimens and should not be used as a sole basis  for treatment. Nasal washings and aspirates are unacceptable for Xpert Xpress SARS-CoV-2/FLU/RSV testing.  Fact Sheet for Patients: EntrepreneurPulse.com.au  Fact Sheet for Healthcare Providers: IncredibleEmployment.be  This test is not yet approved or cleared by the [place] FDA and has been authorized for detection and/or diagnosis of SARS-CoV-2 by FDA under an Emergency Use Authorization (EUA). This EUA will  remain in effect (meaning this test can be used) for the duration of the COVID-19 declaration under Section 564(b)(1) of the Act, 21 U.S.C. section 360bbb-3(b)(1), unless the authorization is terminated or revoked.  Performed at [facility], [address], [place], [place] [address]   Culture, blood (Routine X 2) w Reflex to ID Panel     Status: None (Preliminary result)   Collection Time: 04/25/21  7:26 AM   Specimen: BLOOD RIGHT ARM  Result Value Ref Range Status   Specimen Description BLOOD RIGHT ARM  Final   Special Requests   Final    BOTTLES DRAWN AEROBIC AND ANAEROBIC Blood Culture adequate volume Performed at [facility], [address], [place], [place] [address]    Culture PENDING  Incomplete   Report Status PENDING  Incomplete  Culture, blood (Routine X 2) w Reflex to ID Panel     Status: None (Preliminary result)   Collection Time: 04/25/21  7:32 AM   Specimen: BLOOD RIGHT ARM  Result Value Ref Range Status   Specimen Description BLOOD RIGHT ARM  Final   Special Requests   Final    BOTTLES DRAWN AEROBIC AND ANAEROBIC Blood Culture adequate volume Performed at [facility], [address] [place], [place] [address]    Culture PENDING  Incomplete   Report Status PENDING  Incomplete     Scheduled Meds:  atorvastatin  40 mg Oral Daily   gabapentin  300 mg Oral TID   insulin aspart  0-5 Units Subcutaneous QHS   insulin aspart  0-9 Units Subcutaneous TID WC   loratadine  10 mg Oral Daily   metoprolol succinate  50  mg Oral Daily   multivitamin with minerals  1 tablet Oral Daily   pantoprazole  40 mg Oral Daily   tamsulosin  0.4 mg Oral Daily   vitamin B-12  500 mcg Oral Daily   Continuous Infusions:  sodium chloride 75 mL/hr at 04/24/21 2343   meropenem (MERREM) IV 500 mg (04/25/21 1359)   vancomycin 1,250 mg (04/25/21 1526)    Procedures/Studies: MR FOOT LEFT WO CONTRAST  Result Date: 04/24/2021 CLINICAL DATA:  Foot swelling.  Left great toe redness and swelling. EXAM: MRI OF THE LEFT FOOT WITHOUT CONTRAST TECHNIQUE: Multiplanar, multisequence MR imaging of the left foot was performed. No intravenous contrast was administered. COMPARISON:  None. FINDINGS: Bones/Joint/Cartilage Soft tissue wound along the medial aspect of the distal great toe. Bone marrow edema and bone destruction of the base of the first distal phalanx most concerning for osteomyelitis. Bone marrow edema in the first proximal phalanx which may be reactive versus secondary to osteomyelitis. Severe osteoarthritis of the first MTP joint with subchondral reactive marrow edema. Normal alignment. No joint effusion. Ligaments Collateral ligaments are intact.  Lisfranc ligament is intact. Muscles and Tendons Flexor, peroneal and extensor compartment tendons are intact. Generalized muscle atrophy. Soft tissue No fluid collection or hematoma. 6 mm T2 hyperintense soft tissue nodule along the plantar aspect of the foot at the level of the metatarsal neck between the first and second metatarsals which may reflect a small neuroma or vascular malformation. IMPRESSION: 1. Soft tissue wound along the medial aspect of the distal great toe. Bone marrow edema and bone destruction of the base of the first distal phalanx most concerning for osteomyelitis. Bone marrow edema in the first proximal phalanx which may be reactive versus secondary to osteomyelitis. 2. Severe osteoarthritis of the first MTP joint with subchondral reactive marrow edema. Electronically  Signed  By: [name] M.D.   On: 04/24/2021 15:04   DG Chest Port 1 View  Result Date: 04/24/2021 CLINICAL DATA:  Left flank pain. Possible sepsis. History of diabetes, hypertension and atrial fibrillation. EXAM: PORTABLE CHEST 1 VIEW COMPARISON:  Radiographs 04/21/2021 and 07/03/2020. FINDINGS: 1137 hours. The heart size and mediastinal contours are stable with aortic atherosclerosis. The lungs appear clear. There is no pleural effusion or pneumothorax. No acute osseous findings are evident. There are degenerative changes in the spine associated with a mild scoliosis. Telemetry leads overlie the chest. IMPRESSION: Stable chest.  No acute cardiopulmonary process. Electronically Signed   By: Richardean Sale M.D.   On: 04/24/2021 12:20   DG Foot Complete Left  Result Date: 04/24/2021 CLINICAL DATA:  Left flank pain. Left great toe erythema. Question osteomyelitis. EXAM: LEFT FOOT - COMPLETE 3+ VIEW COMPARISON:  Reports from left foot radiographs 03/20/2019 and 03/21/2019. 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 The distal 2nd phalanx appears eroded without acute cortical destruction. There is soft tissue swelling and soft tissue emphysema of the great toe. There is possible cortical destruction involving the medial base of the distal 1st phalanx. Moderate degenerative changes are present at the 1st metatarsophalangeal joint. IMPRESSION: 1. Prior studies are unavailable for direct comparison. 2. Soft tissue swelling and emphysema within the great toe consistent with soft tissue infection. Mild irregularity of the medial base of the distal phalanx could reflect early osteomyelitis. Consider further evaluation with MRI. 3. 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 Electronically Signed   By: Richardean Sale M.D.   On: 04/24/2021 12:24   CT Renal Stone Study  Result Date: 04/24/2021 CLINICAL DATA:  Flank pain, kidney stones suspected; left flank pain EXAM: CT ABDOMEN AND PELVIS WITHOUT CONTRAST TECHNIQUE: Multidetector CT imaging of the abdomen and pelvis was performed following the standard protocol without IV contrast. COMPARISON:  None. FINDINGS: Lower chest: No acute abnormality. Hepatobiliary: No focal liver abnormality is seen. No gallstones, gallbladder wall thickening, or biliary dilatation. Pancreas: Unremarkable. Spleen: Unremarkable. Adrenals/Urinary Tract: Adrenals are unremarkable. No renal calculi or hydronephrosis. Ureters are normal in caliber. Bladder is unremarkable. Stomach/Bowel: Stomach is within normal limits. Bowel is normal in caliber. Normal appendix. Vascular/Lymphatic: Extensive atherosclerosis. No enlarged lymph nodes. Reproductive: Unremarkable. Other: No free fluid. No significant abnormality of the abdominal wall. Musculoskeletal: Degenerative changes of the included spine. IMPRESSION: No acute abnormality.  No urinary tract calculi or hydronephrosis. Extensive atherosclerosis. Electronically Signed   By: Macy Mis M.D.   On: 04/24/2021 11:32    Orson Eva, DO  [facility]  If 7PM-7AM, please contact night-coverage [email or web address] Password [email or web address] 04/25/2021, 4:28 PM   LOS: 1 day

## 2021-04-25 NOTE — Progress Notes (Signed)
Responded to nursing call:  soft BPs in upper 80s   Subjective: Patient denies cp, sob, n/v  Vitals:   04/25/21 1400 04/25/21 1600 04/25/21 1611 04/25/21 1706  BP: 134/62  117/64   Pulse: 92  (!) 125 (!) 130  Resp: 18 (!) 30 (!) 30 (!) 22  Temp: 98.2 F (36.8 C)  (!) 101.4 F (38.6 C) 99.5 F (37.5 C)  TempSrc: Oral  Oral Oral  SpO2: 94%  93%   Weight:      Height:       CV--RRR Lung--fine bibasilar rales. No wheeze Abd--soft+BS/NT   Assessment/Plan: Afib with RVR -mostly reactive to fever -bolus LR 1L -transfer to SDU -if HR still up and fever improved, consider digoxin/amio if BP remains soft -personally reviewed CXR--no pulm edema, +RLL atelectatsis    Catarina Hartshorn, DO [facility]

## 2021-04-25 NOTE — Progress Notes (Signed)
Cross coverage note:   Patient in a fib RVR with rate 140-160. His BP had been low earlier this evening but now normal or elevated for the past couple hours after he received a liter of IVF. Plan to start IV diltiazem with bolus and infusion.

## 2021-04-25 NOTE — Consult Note (Signed)
Reason for Consult: Cellulitis, left great toe, diabetes mellitus Referring Physician: Dr. [name] is an 75 y.o. male.  HPI: Patient is a 75 year old white male with multiple medical problems including atrial fibrillation, diabetes mellitus, peripheral vascular disease who presented back to the emergency room with left foot pain.  He was found to have cellulitis of the left great toe.  Patient is a poor historian, thus I reviewed the admitting H&P.  MRI report as below is consistent with osteomyelitis of the left great toe.  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    Past Medical History:  Diagnosis Date   Atrial fibrillation (HCC)    Cervical spinal stenosis    DDD (degenerative disc disease), cervical    DDD (degenerative disc disease), lumbar    Diabetes mellitus without complication (HCC)    Hyperlipidemia    Hypertension    Peripheral artery disease (HCC)     Past Surgical History:  Procedure Laterality Date   AMPUTATION TOE     KNEE SURGERY Left    NOSE SURGERY      Family History  Problem Relation Age of Onset   Stroke Mother    Heart disease Mother        CABG   Cancer Father     Social History:  reports that he quit smoking about 9 years ago. His smoking use included cigarettes. He started smoking about 54 years ago. He smoked an average of 1 pack per day. He has never used smokeless tobacco. He reports that he does not currently use drugs. He reports that he does not drink alcohol.  Allergies:  Allergies  Allergen Reactions   Asa [Aspirin] Hives   Penicillins Hives    Medications: I have reviewed the patient's current medications.  Results for orders placed or performed during the hospital encounter of 04/24/21 (from the past 48 hour(s))  Urinalysis, Routine w reflex microscopic     Status: Abnormal   Collection Time: 04/24/21 10:29 AM  Result Value Ref Range   Color, Urine YELLOW YELLOW   APPearance HAZY (A) CLEAR    Specific Gravity, Urine 1.018 1.005 - 1.030   pH 5.0 5.0 - 8.0   Glucose, UA >=500 (A) NEGATIVE mg/dL   Hgb urine dipstick MODERATE (A) NEGATIVE   Bilirubin Urine NEGATIVE NEGATIVE   Ketones, ur NEGATIVE NEGATIVE mg/dL   Protein, ur 30 (A) NEGATIVE mg/dL   Nitrite NEGATIVE NEGATIVE   Leukocytes,Ua NEGATIVE NEGATIVE   RBC / HPF 0-5 0 - 5 RBC/hpf   WBC, UA 0-5 0 - 5 WBC/hpf   Bacteria, UA NONE SEEN NONE SEEN   Squamous Epithelial / LPF 0-5 0 - 5   Hyaline Casts, UA PRESENT     Comment: Performed at [facility], [address], [place], [place] [address]  Lactic acid, plasma     Status: None   Collection Time: 04/24/21 10:41 AM  Result Value Ref Range   Lactic Acid, Venous 1.3 0.5 - 1.9 mmol/L    Comment: Performed at [facility], [address] [place], [place] [address]  Comprehensive metabolic panel     Status: Abnormal   Collection Time: 04/24/21 10:41 AM  Result Value Ref Range   Sodium 127 (L) 135 - 145 mmol/L   Potassium 4.4 3.5 - 5.1 mmol/L   Chloride 96 (L) 98 - 111 mmol/L   CO2 24 22 - 32 mmol/L   Glucose, Bld 333 (H) 70 -  99 mg/dL    Comment: Glucose reference range applies only to samples taken after fasting for at least 8 hours.   BUN 27 (H) 8 - 23 mg/dL   Creatinine, Ser 1.61 (H) 0.61 - 1.24 mg/dL   Calcium 8.7 (L) 8.9 - 10.3 mg/dL   Total Protein 6.9 6.5 - 8.1 g/dL   Albumin 3.1 (L) 3.5 - 5.0 g/dL   AST 40 15 - 41 U/L   ALT 33 0 - 44 U/L   Alkaline Phosphatase 65 38 - 126 U/L   Total Bilirubin 1.1 0.3 - 1.2 mg/dL   GFR, Estimated 37 (L) >60 mL/min    Comment: (NOTE) Calculated using the CKD-EPI Creatinine Equation (2021)    Anion gap 7 5 - 15    Comment: Performed at [facility], [address] [place], [place] [address]  CBC WITH DIFFERENTIAL     Status: Abnormal   Collection Time: 04/24/21 10:41 AM  Result Value Ref Range   WBC 14.3 (H) 4.0 - 10.5 K/uL   RBC 3.89 (L) 4.22 - 5.81 MIL/uL   Hemoglobin 12.4 (L) 13.0 - 17.0 g/dL   HCT 54.0 (L) 98.1 - 19.1  %   MCV 91.8 80.0 - 100.0 fL   MCH 31.9 26.0 - 34.0 pg   MCHC 34.7 30.0 - 36.0 g/dL   RDW 47.8 29.5 - 62.1 %   Platelets 188 150 - 400 K/uL   nRBC 0.0 0.0 - 0.2 %   Neutrophils Relative % 84 %   Neutro Abs 12.0 (H) 1.7 - 7.7 K/uL   Lymphocytes Relative 8 %   Lymphs Abs 1.2 0.7 - 4.0 K/uL   Monocytes Relative 7 %   Monocytes Absolute 1.0 0.1 - 1.0 K/uL   Eosinophils Relative 0 %   Eosinophils Absolute 0.0 0.0 - 0.5 K/uL   Basophils Relative 0 %   Basophils Absolute 0.0 0.0 - 0.1 K/uL   Immature Granulocytes 1 %   Abs Immature Granulocytes 0.09 (H) 0.00 - 0.07 K/uL    Comment: Performed at [facility], [address], [place], [place] [address]  Protime-INR     Status: Abnormal   Collection Time: 04/24/21 10:41 AM  Result Value Ref Range   Prothrombin Time 17.0 (H) 11.4 - 15.2 seconds   INR 1.4 (H) 0.8 - 1.2    Comment: (NOTE) INR goal varies based on device and disease states. Performed at [facility], [address] [place], [place] [address]   APTT     Status: None   Collection Time: 04/24/21 10:41 AM  Result Value Ref Range   aPTT 33 24 - 36 seconds    Comment: Performed at [facility], [address] [place], [place] [address]  Blood culture (routine single)     Status: None (Preliminary result)   Collection Time: 04/24/21 10:42 AM   Specimen: Right Antecubital; Blood  Result Value Ref Range   Specimen Description      RIGHT ANTECUBITAL Performed at [facility], [address] [place], [place] [address]    Special Requests      BOTTLES DRAWN AEROBIC AND ANAEROBIC Blood Culture adequate volume Performed at [facility], [address], [place], [place] [address]    Culture  Setup Time      GRAM POSITIVE COCCI Gram Stain Report Called to,Read Back By and Verified With: BRAME,M @ 2048 04/24/21 BY STEPHTR IN BOTH AEROBIC AND ANAEROBIC BOTTLES CRITICAL RESULT CALLED TO, READ BACK BY AND VERIFIED WITH: RN N.MOODY  AT 0146 ON 04/25/2021 BY T.SAAD. Performed at [facility], [address], [place], [place] [address]    Culture GRAM POSITIVE COCCI    Report Status PENDING   Blood Culture ID Panel (Reflexed)     Status: Abnormal   Collection Time: 04/24/21 10:42 AM  Result Value Ref Range   Enterococcus faecalis NOT DETECTED NOT DETECTED   Enterococcus Faecium NOT DETECTED NOT DETECTED   Listeria monocytogenes NOT DETECTED NOT DETECTED   Staphylococcus species NOT DETECTED NOT DETECTED   Staphylococcus aureus (BCID) NOT DETECTED NOT DETECTED   Staphylococcus epidermidis NOT DETECTED NOT DETECTED   Staphylococcus lugdunensis NOT DETECTED NOT DETECTED   Streptococcus species DETECTED (A) NOT DETECTED    Comment: CRITICAL RESULT CALLED TO, READ BACK BY AND VERIFIED WITH: RN N.MOODY AT 0146 ON 04/25/2021 BY T.SAAD.    Streptococcus agalactiae DETECTED (A) NOT DETECTED    Comment: CRITICAL RESULT CALLED TO, READ BACK BY AND VERIFIED WITH: RN N.MOODY AT 0146 ON 04/25/2021 BY T.SAAD.    Streptococcus pneumoniae NOT DETECTED NOT DETECTED   Streptococcus pyogenes NOT DETECTED NOT DETECTED   A.calcoaceticus-baumannii NOT DETECTED NOT DETECTED   Bacteroides fragilis NOT DETECTED NOT DETECTED   Enterobacterales NOT DETECTED NOT DETECTED   Enterobacter cloacae complex NOT DETECTED NOT DETECTED   Escherichia coli NOT DETECTED NOT DETECTED   Klebsiella aerogenes NOT DETECTED NOT DETECTED   Klebsiella oxytoca NOT DETECTED NOT DETECTED   Klebsiella pneumoniae NOT DETECTED NOT DETECTED   Proteus species NOT DETECTED NOT DETECTED   Salmonella species NOT DETECTED NOT DETECTED   Serratia marcescens NOT DETECTED NOT DETECTED   Haemophilus influenzae NOT DETECTED NOT DETECTED   Neisseria meningitidis NOT DETECTED NOT DETECTED   Pseudomonas aeruginosa NOT DETECTED NOT DETECTED   Stenotrophomonas maltophilia NOT DETECTED NOT DETECTED   Candida albicans NOT DETECTED NOT DETECTED   Candida auris NOT DETECTED NOT DETECTED   Candida glabrata NOT DETECTED NOT  DETECTED   Candida krusei NOT DETECTED NOT DETECTED   Candida parapsilosis NOT DETECTED NOT DETECTED   Candida tropicalis NOT DETECTED NOT DETECTED   Cryptococcus neoformans/gattii NOT DETECTED NOT DETECTED    Comment: Performed at [facility], [address] [place], [place] [address]  Urine Culture     Status: None (Preliminary result)   Collection Time: 04/24/21 11:20 AM   Specimen: In/Out Cath Urine  Result Value Ref Range   Specimen Description      IN/OUT CATH URINE Performed at [facility], [address], [place], [place] [address]    Special Requests      NONE Performed at [facility], [address], [place], [place] [address]    Culture      CULTURE REINCUBATED FOR BETTER GROWTH Performed at [facility], [address] [place], [place] [address]    Report Status PENDING   Lactic acid, plasma     Status: Abnormal   Collection Time: 04/24/21 12:15 PM  Result Value Ref Range   Lactic Acid, Venous 2.2 (HH) 0.5 - 1.9 mmol/L    Comment: CRITICAL RESULT CALLED TO, READ BACK BY AND VERIFIED WITH: LONG,L @ 1306 04/24/21 BY STEPHTR Performed at [facility], [address], [place], [place] [address]   Resp Panel by RT-PCR (Flu A&B, Covid) Nasopharyngeal Swab     Status: None   Collection Time: 04/24/21  1:15 PM   Specimen: Nasopharyngeal Swab; Nasopharyngeal(NP) swabs in vial transport medium  Result Value Ref Range  SARS Coronavirus 2 by RT PCR NEGATIVE NEGATIVE    Comment: (NOTE) SARS-CoV-2 target nucleic acids are NOT DETECTED.  The SARS-CoV-2 RNA is generally detectable in upper respiratory specimens during the acute phase of infection. The lowest concentration of SARS-CoV-2 viral copies this assay can detect is 138 copies/mL. A negative result does not preclude SARS-Cov-2 infection and should not be used as the sole basis for treatment or other patient management decisions. A negative result may occur with  improper specimen collection/handling,  submission of specimen other than nasopharyngeal swab, presence of viral mutation(s) within the areas targeted by this assay, and inadequate number of viral copies(<138 copies/mL). A negative result must be combined with clinical observations, patient history, and epidemiological information. The expected result is Negative.  Fact Sheet for Patients:  BloggerCourse.comhttps://www.fda.gov/media/152166/download  Fact Sheet for Healthcare Providers:  SeriousBroker.ithttps://www.fda.gov/media/152162/download  This test is no t yet approved or cleared by the [place] FDA and  has been authorized for detection and/or diagnosis of SARS-CoV-2 by FDA under an Emergency Use Authorization (EUA). This EUA will remain  in effect (meaning this test can be used) for the duration of the COVID-19 declaration under Section 564(b)(1) of the Act, 21 U.S.C.section 360bbb-3(b)(1), unless the authorization is terminated  or revoked sooner.       Influenza A by PCR NEGATIVE NEGATIVE   Influenza B by PCR NEGATIVE NEGATIVE    Comment: (NOTE) The Xpert Xpress SARS-CoV-2/FLU/RSV plus assay is intended as an aid in the diagnosis of influenza from Nasopharyngeal swab specimens and should not be used as a sole basis for treatment. Nasal washings and aspirates are unacceptable for Xpert Xpress SARS-CoV-2/FLU/RSV testing.  Fact Sheet for Patients: BloggerCourse.comhttps://www.fda.gov/media/152166/download  Fact Sheet for Healthcare Providers: SeriousBroker.ithttps://www.fda.gov/media/152162/download  This test is not yet approved or cleared by the [place] FDA and has been authorized for detection and/or diagnosis of SARS-CoV-2 by FDA under an Emergency Use Authorization (EUA). This EUA will remain in effect (meaning this test can be used) for the duration of the COVID-19 declaration under Section 564(b)(1) of the Act, 21 U.S.C. section 360bbb-3(b)(1), unless the authorization is terminated or revoked.  Performed at [facility], [address]  [place], [place] [address]   Sedimentation rate     Status: Abnormal   Collection Time: 04/24/21  2:14 PM  Result Value Ref Range   Sed Rate 119 (H) 0 - 16 mm/hr    Comment: Performed at [facility], [address] [place], [place] [address]  Hemoglobin A1c     Status: Abnormal   Collection Time: 04/24/21  2:14 PM  Result Value Ref Range   Hgb A1c MFr Bld 8.3 (H) 4.8 - 5.6 %    Comment: (NOTE) Pre diabetes:          5.7%-6.4%  Diabetes:              >6.4%  Glycemic control for   <7.0% adults with diabetes    Mean Plasma Glucose 191.51 mg/dL    Comment: Performed at [facility], [address] [place], [place] [address]  CBG monitoring, ED     Status: Abnormal   Collection Time: 04/24/21  9:18 PM  Result Value Ref Range   Glucose-Capillary 175 (H) 70 - 99 mg/dL    Comment: Glucose reference range applies only to samples taken after fasting for at least 8 hours.   Comment 1 Notify RN    Comment 2 Document in Chart   C-reactive protein  Status: Abnormal   Collection Time: 04/25/21  5:58 AM  Result Value Ref Range   CRP 32.4 (H) <1.0 mg/dL    Comment: Performed at [facility], [address], [place], [place] [address]  Basic metabolic panel     Status: Abnormal   Collection Time: 04/25/21  5:58 AM  Result Value Ref Range   Sodium 133 (L) 135 - 145 mmol/L   Potassium 4.3 3.5 - 5.1 mmol/L   Chloride 104 98 - 111 mmol/L   CO2 24 22 - 32 mmol/L   Glucose, Bld 181 (H) 70 - 99 mg/dL    Comment: Glucose reference range applies only to samples taken after fasting for at least 8 hours.   BUN 24 (H) 8 - 23 mg/dL   Creatinine, Ser 1.91 (H) 0.61 - 1.24 mg/dL   Calcium 8.4 (L) 8.9 - 10.3 mg/dL   GFR, Estimated 39 (L) >60 mL/min    Comment: (NOTE) Calculated using the CKD-EPI Creatinine Equation (2021)    Anion gap 5 5 - 15    Comment: Performed at [facility], [address] [place], [place] [address]  CBC     Status: Abnormal   Collection Time: 04/25/21  5:58 AM  Result  Value Ref Range   WBC 12.6 (H) 4.0 - 10.5 K/uL   RBC 3.33 (L) 4.22 - 5.81 MIL/uL   Hemoglobin 10.4 (L) 13.0 - 17.0 g/dL   HCT 56.2 (L) 13.0 - 86.5 %   MCV 94.6 80.0 - 100.0 fL   MCH 31.2 26.0 - 34.0 pg   MCHC 33.0 30.0 - 36.0 g/dL   RDW 78.4 69.6 - 29.5 %   Platelets 172 150 - 400 K/uL   nRBC 0.0 0.0 - 0.2 %    Comment: Performed at [facility], [address] [place], [place] [address]  Glucose, capillary     Status: Abnormal   Collection Time: 04/25/21  7:20 AM  Result Value Ref Range   Glucose-Capillary 154 (H) 70 - 99 mg/dL    Comment: Glucose reference range applies only to samples taken after fasting for at least 8 hours.  Culture, blood (Routine X 2) w Reflex to ID Panel     Status: None (Preliminary result)   Collection Time: 04/25/21  7:26 AM   Specimen: BLOOD RIGHT ARM  Result Value Ref Range   Specimen Description BLOOD RIGHT ARM    Special Requests      BOTTLES DRAWN AEROBIC AND ANAEROBIC Blood Culture adequate volume Performed at [facility], [address], [place], [place] [address]    Culture PENDING    Report Status PENDING   Culture, blood (Routine X 2) w Reflex to ID Panel     Status: None (Preliminary result)   Collection Time: 04/25/21  7:32 AM   Specimen: BLOOD RIGHT ARM  Result Value Ref Range   Specimen Description BLOOD RIGHT ARM    Special Requests      BOTTLES DRAWN AEROBIC AND ANAEROBIC Blood Culture adequate volume Performed at [facility], [address], [place], [place] [address]    Culture PENDING    Report Status PENDING     MR FOOT LEFT WO CONTRAST  Result Date: 04/24/2021 CLINICAL DATA:  Foot swelling.  Left great toe redness and swelling. EXAM: MRI OF THE LEFT FOOT WITHOUT CONTRAST TECHNIQUE: Multiplanar, multisequence MR imaging of the left foot was performed. No intravenous contrast was administered. COMPARISON:  None. FINDINGS: Bones/Joint/Cartilage Soft tissue wound along the medial aspect of  the distal great toe. Bone marrow edema  and bone destruction of the base of the first distal phalanx most concerning for osteomyelitis. Bone marrow edema in the first proximal phalanx which may be reactive versus secondary to osteomyelitis. Severe osteoarthritis of the first MTP joint with subchondral reactive marrow edema. Normal alignment. No joint effusion. Ligaments Collateral ligaments are intact.  Lisfranc ligament is intact. Muscles and Tendons Flexor, peroneal and extensor compartment tendons are intact. Generalized muscle atrophy. Soft tissue No fluid collection or hematoma. 6 mm T2 hyperintense soft tissue nodule along the plantar aspect of the foot at the level of the metatarsal neck between the first and second metatarsals which may reflect a small neuroma or vascular malformation. IMPRESSION: 1. Soft tissue wound along the medial aspect of the distal great toe. Bone marrow edema and bone destruction of the base of the first distal phalanx most concerning for osteomyelitis. Bone marrow edema in the first proximal phalanx which may be reactive versus secondary to osteomyelitis. 2. Severe osteoarthritis of the first MTP joint with subchondral reactive marrow edema. Electronically Signed   By: Elige Ko M.D.   On: 04/24/2021 15:04   DG Chest Port 1 View  Result Date: 04/24/2021 CLINICAL DATA:  Left flank pain. Possible sepsis. History of diabetes, hypertension and atrial fibrillation. EXAM: PORTABLE CHEST 1 VIEW COMPARISON:  Radiographs 04/21/2021 and 07/03/2020. FINDINGS: 1137 hours. The heart size and mediastinal contours are stable with aortic atherosclerosis. The lungs appear clear. There is no pleural effusion or pneumothorax. No acute osseous findings are evident. There are degenerative changes in the spine associated with a mild scoliosis. Telemetry leads overlie the chest. IMPRESSION: Stable chest.  No acute cardiopulmonary process. Electronically Signed   By: Carey Bullocks M.D.   On: 04/24/2021 12:20   DG Foot Complete  Left  Result Date: 04/24/2021 CLINICAL DATA:  Left flank pain. Left great toe erythema. Question osteomyelitis. EXAM: LEFT FOOT - COMPLETE 3+ VIEW COMPARISON:  Reports from left foot radiographs 03/20/2019 and 03/21/2019. 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 The distal 2nd phalanx appears eroded without acute cortical destruction. There is soft tissue swelling and soft tissue emphysema of the great toe. There is possible cortical destruction involving the medial base of the distal 1st phalanx. Moderate degenerative changes are present at the 1st metatarsophalangeal joint. IMPRESSION: 1. Prior studies are unavailable for direct comparison. 2. Soft tissue swelling and emphysema within the great toe consistent with soft tissue infection. Mild irregularity of the medial base of the distal phalanx could reflect early osteomyelitis. Consider further evaluation with MRI. 3. 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 Electronically Signed   By: Carey Bullocks M.D.   On: 04/24/2021 12:24   CT Renal Stone Study  Result Date: 04/24/2021 CLINICAL DATA:  Flank pain, kidney stones suspected; left flank pain EXAM: CT ABDOMEN AND PELVIS WITHOUT CONTRAST TECHNIQUE: Multidetector CT imaging of the abdomen and pelvis was performed following the standard protocol without IV contrast. COMPARISON:  None. FINDINGS: Lower chest: No acute abnormality. Hepatobiliary: No focal liver abnormality is seen. No gallstones, gallbladder wall thickening, or biliary dilatation. Pancreas: Unremarkable. Spleen: Unremarkable. Adrenals/Urinary Tract: Adrenals are unremarkable. No renal calculi or hydronephrosis. Ureters are normal in caliber. Bladder is unremarkable. Stomach/Bowel: Stomach is  within normal limits. Bowel is  normal in caliber. Normal appendix. Vascular/Lymphatic: Extensive atherosclerosis. No enlarged lymph nodes. Reproductive: Unremarkable. Other: No free fluid. No significant abnormality of the abdominal wall. Musculoskeletal: Degenerative changes of the included spine. IMPRESSION: No acute abnormality.  No urinary tract calculi or hydronephrosis. Extensive atherosclerosis. Electronically Signed   By: Guadlupe Spanish M.D.   On: 04/24/2021 11:32    ROS:  Review of systems not obtained due to patient factors.  Blood pressure (!) 131/59, pulse (!) 102, temperature 98.5 F (36.9 C), temperature source Oral, resp. rate 18, height 6' (1.829 m), weight 100.1 kg, SpO2 94 %. Physical Exam: Ornery white male in no acute distress Patient appears unkept. Lungs clear to auscultation with good breath sounds bilaterally Heart examination reveals an irregularly irregular rate and rhythm Extremity examination: Weakly palpable bilateral femoral pulses.  No dorsalis pedis or posterior pulses were palpable in the left foot.  He does have edema and erythema of the distal portion of the left foot with ulcerations present on the distal tuft of and base of the left great toe.  He is status post amputation of the third toe. 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  Uncontrolled diabetes mellitus, history of peripheral vascular disease, atrial fibrillation.  His anticoagulation has been stopped.  I am concerned about the lack of good femoral pulses.  Would continue IV antibiotics as ordered.  He will need arterial segmental Dopplers prior to any further surgical intervention.  Discussed with Dr. Arbutus Leas.  I told the patient that he will ultimately require an amputation of the toe, and he is fine with that.  Todd Zhang 04/25/2021, 10:08 AM

## 2021-04-25 NOTE — Progress Notes (Signed)
Pharmacy Antibiotic Note  Todd Zhang is a 75 y.o. male admitted on 04/24/2021 with sepsis.  Pharmacy has been consulted for Vancomycin dosing.  Plan: Vancomycin 2000 mg IV x 1 dose. Vancomycin 1250 mg IV every 24 hours. Monitor labs, c/s, and vanco level as indicated.  Height: 6' (182.9 cm) Weight: 100.1 kg (220 lb 10.9 oz) IBW/kg (Calculated) : 77.6  Temp (24hrs), Avg:99.8 F (37.7 C), Min:98.5 F (36.9 C), Max:102.9 F (39.4 C)  Recent Labs  Lab 04/24/21 1041 04/24/21 1215 04/25/21 0558  WBC 14.3*  --  12.6*  CREATININE 1.89*  --  1.79*  LATICACIDVEN 1.3 2.2*  --      Estimated Creatinine Clearance: 43.7 mL/min (A) (by C-G formula based on SCr of 1.79 mg/dL (H)).    Allergies  Allergen Reactions   Asa [Aspirin] Hives   Penicillins Hives    Antimicrobials this admission: Vanco 8/12 >>  Merrem 8/12 >>    Microbiology results: 8/12 BCx: pending 8/12 UCx: pending   Thank you for allowing pharmacy to be a part of this patient's care.  Valrie Hart A 04/25/2021 11:10 AM

## 2021-04-26 ENCOUNTER — Inpatient Hospital Stay (HOSPITAL_COMMUNITY): Payer: Medicare Other

## 2021-04-26 DIAGNOSIS — A401 Sepsis due to streptococcus, group B: Secondary | ICD-10-CM

## 2021-04-26 DIAGNOSIS — I4891 Unspecified atrial fibrillation: Secondary | ICD-10-CM | POA: Diagnosis not present

## 2021-04-26 DIAGNOSIS — R7881 Bacteremia: Secondary | ICD-10-CM

## 2021-04-26 DIAGNOSIS — L03032 Cellulitis of left toe: Secondary | ICD-10-CM | POA: Diagnosis not present

## 2021-04-26 DIAGNOSIS — L03119 Cellulitis of unspecified part of limb: Secondary | ICD-10-CM | POA: Diagnosis not present

## 2021-04-26 DIAGNOSIS — N1832 Chronic kidney disease, stage 3b: Secondary | ICD-10-CM | POA: Diagnosis not present

## 2021-04-26 DIAGNOSIS — M86172 Other acute osteomyelitis, left ankle and foot: Secondary | ICD-10-CM | POA: Diagnosis not present

## 2021-04-26 LAB — COMPREHENSIVE METABOLIC PANEL
ALT: 29 U/L (ref 0–44)
AST: 36 U/L (ref 15–41)
Albumin: 2.5 g/dL — ABNORMAL LOW (ref 3.5–5.0)
Alkaline Phosphatase: 80 U/L (ref 38–126)
Anion gap: 5 (ref 5–15)
BUN: 21 mg/dL (ref 8–23)
CO2: 23 mmol/L (ref 22–32)
Calcium: 7.9 mg/dL — ABNORMAL LOW (ref 8.9–10.3)
Chloride: 103 mmol/L (ref 98–111)
Creatinine, Ser: 1.55 mg/dL — ABNORMAL HIGH (ref 0.61–1.24)
GFR, Estimated: 46 mL/min — ABNORMAL LOW (ref >60)
Glucose, Bld: 194 mg/dL — ABNORMAL HIGH (ref 70–99)
Potassium: 3.8 mmol/L (ref 3.5–5.1)
Sodium: 131 mmol/L — ABNORMAL LOW (ref 135–145)
Total Bilirubin: 0.8 mg/dL (ref 0.3–1.2)
Total Protein: 6.4 g/dL — ABNORMAL LOW (ref 6.5–8.1)

## 2021-04-26 LAB — GLUCOSE, CAPILLARY
Glucose-Capillary: 177 mg/dL — ABNORMAL HIGH (ref 70–99)
Glucose-Capillary: 179 mg/dL — ABNORMAL HIGH (ref 70–99)
Glucose-Capillary: 197 mg/dL — ABNORMAL HIGH (ref 70–99)
Glucose-Capillary: 196 mg/dL — ABNORMAL HIGH (ref 70–99)

## 2021-04-26 LAB — CBC
HCT: 32.8 % — ABNORMAL LOW (ref 39.0–52.0)
Hemoglobin: 10.9 g/dL — ABNORMAL LOW (ref 13.0–17.0)
MCH: 31 pg (ref 26.0–34.0)
MCHC: 33.2 g/dL (ref 30.0–36.0)
MCV: 93.2 fL (ref 80.0–100.0)
Platelets: 196 10*3/uL (ref 150–400)
RBC: 3.52 MIL/uL — ABNORMAL LOW (ref 4.22–5.81)
RDW: 12.8 % (ref 11.5–15.5)
WBC: 11.3 10*3/uL — ABNORMAL HIGH (ref 4.0–10.5)
nRBC: 0 % (ref 0.0–0.2)

## 2021-04-26 LAB — ECHOCARDIOGRAM COMPLETE
AR max vel: 2.23 cm2
AV Area VTI: 1.86 cm2
AV Area mean vel: 2.07 cm2
AV Mean grad: 11 mmHg
AV Peak grad: 15.4 mmHg
Ao pk vel: 1.96 m/s
Area-P 1/2: 4.89 cm2
Height: 72 in
S' Lateral: 3 cm
Weight: 3619.07 oz

## 2021-04-26 LAB — BRAIN NATRIURETIC PEPTIDE: B Natriuretic Peptide: 252 pg/mL — ABNORMAL HIGH (ref 0.0–100.0)

## 2021-04-26 LAB — MAGNESIUM: Magnesium: 1.6 mg/dL — ABNORMAL LOW (ref 1.7–2.4)

## 2021-04-26 LAB — MRSA NEXT GEN BY PCR, NASAL: MRSA by PCR Next Gen: NOT DETECTED

## 2021-04-26 MED ORDER — DILTIAZEM HCL 60 MG PO TABS
60.0000 mg | ORAL_TABLET | Freq: Four times a day (QID) | ORAL | Status: DC
  Administered 2021-04-26 – 2021-04-27 (×4): 60 mg via ORAL
  Filled 2021-04-26 (×4): qty 1

## 2021-04-26 MED ORDER — SALINE SPRAY 0.65 % NA SOLN
1.0000 | NASAL | Status: DC | PRN
  Administered 2021-04-26: 1 via NASAL
  Filled 2021-04-26: qty 44

## 2021-04-26 NOTE — Plan of Care (Signed)

## 2021-04-26 NOTE — Progress Notes (Signed)
Subjective: Patient transferred last night to the ICU due to uncontrolled atrial fibrillation.  Objective: Vital signs in last 24 hours: Temp:  [98 F (36.7 C)-101.4 F (38.6 C)] 99.9 F (37.7 C) (08/14 0728) Pulse Rate:  [92-156] 114 (08/14 0728) Resp:  [18-37] 30 (08/14 0200) BP: (99-196)/(35-132) 112/47 (08/14 0600) SpO2:  [82 %-99 %] 82 % (08/14 0728) Weight:  [102.6 kg] 102.6 kg (08/13 2002)    Intake/Output from previous day: 08/13 0701 - 08/14 0700 In: 3767.9 [P.O.:720; I.V.:2397.9; IV Piggyback:650] Out: 1790 [Urine:1790] Intake/Output this shift: No intake/output data recorded.  General appearance: alert, cooperative, and no distress Extremities: Left foot erythema and swelling slightly less.  Still with serous drainage from left great toe.  Still cannot easily palpate dorsalis pedis or posterior tibial pulses.  Lab Results:  Recent Labs    04/25/21 0558 04/26/21 0701  WBC 12.6* 11.3*  HGB 10.4* 10.9*  HCT 31.5* 32.8*  PLT 172 196   BMET Recent Labs    04/25/21 0558 04/26/21 0701  NA 133* 131*  K 4.3 3.8  CL 104 103  CO2 24 23  GLUCOSE 181* 194*  BUN 24* 21  CREATININE 1.79* 1.55*  CALCIUM 8.4* 7.9*   PT/INR Recent Labs    04/24/21 1041  LABPROT 17.0*  INR 1.4*    Studies/Results: MR FOOT LEFT WO CONTRAST  Result Date: 04/24/2021 CLINICAL DATA:  Foot swelling.  Left great toe redness and swelling. EXAM: MRI OF THE LEFT FOOT WITHOUT CONTRAST TECHNIQUE: Multiplanar, multisequence MR imaging of the left foot was performed. No intravenous contrast was administered. COMPARISON:  None. FINDINGS: Bones/Joint/Cartilage Soft tissue wound along the medial aspect of the distal great toe. Bone marrow edema and bone destruction of the base of the first distal phalanx most concerning for osteomyelitis. Bone marrow edema in the first proximal phalanx which may be reactive versus secondary to osteomyelitis. Severe osteoarthritis of the first MTP joint with  subchondral reactive marrow edema. Normal alignment. No joint effusion. Ligaments Collateral ligaments are intact.  Lisfranc ligament is intact. Muscles and Tendons Flexor, peroneal and extensor compartment tendons are intact. Generalized muscle atrophy. Soft tissue No fluid collection or hematoma. 6 mm T2 hyperintense soft tissue nodule along the plantar aspect of the foot at the level of the metatarsal neck between the first and second metatarsals which may reflect a small neuroma or vascular malformation. IMPRESSION: 1. Soft tissue wound along the medial aspect of the distal great toe. Bone marrow edema and bone destruction of the base of the first distal phalanx most concerning for osteomyelitis. Bone marrow edema in the first proximal phalanx which may be reactive versus secondary to osteomyelitis. 2. Severe osteoarthritis of the first MTP joint with subchondral reactive marrow edema. Electronically Signed   By: Elige Ko M.D.   On: 04/24/2021 15:04   DG CHEST PORT 1 VIEW  Result Date: 04/25/2021 CLINICAL DATA:  Sepsis, shortness of breath EXAM: PORTABLE CHEST 1 VIEW COMPARISON:  04/24/2021 FINDINGS: Stable cardiomediastinal contours. Atherosclerotic calcification of the aortic knob. Slightly increasing interstitial markings bilaterally. No lobar consolidation. No pleural effusion or pneumothorax. IMPRESSION: Slightly increasing interstitial markings bilaterally may reflect mild edema or atypical/viral infection. Electronically Signed   By: Duanne Guess D.O.   On: 04/25/2021 17:21   DG Chest Port 1 View  Result Date: 04/24/2021 CLINICAL DATA:  Left flank pain. Possible sepsis. History of diabetes, hypertension and atrial fibrillation. EXAM: PORTABLE CHEST 1 VIEW COMPARISON:  Radiographs 04/21/2021 and 07/03/2020. FINDINGS: 1137 hours.  The heart size and mediastinal contours are stable with aortic atherosclerosis. The lungs appear clear. There is no pleural effusion or pneumothorax. No acute  osseous findings are evident. There are degenerative changes in the spine associated with a mild scoliosis. Telemetry leads overlie the chest. IMPRESSION: Stable chest.  No acute cardiopulmonary process. Electronically Signed   By: Carey Bullocks M.D.   On: 04/24/2021 12:20   DG Foot Complete Left  Result Date: 04/24/2021 CLINICAL DATA:  Left flank pain. Left great toe erythema. Question osteomyelitis. EXAM: LEFT FOOT - COMPLETE 3+ VIEW COMPARISON:  Reports from left foot radiographs 03/20/2019 and 03/21/2019. 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 The distal 2nd phalanx appears eroded without acute cortical destruction. There is soft tissue swelling and soft tissue emphysema of the great toe. There is possible cortical destruction involving the medial base of the distal 1st phalanx. Moderate degenerative changes are present at the 1st metatarsophalangeal joint. IMPRESSION: 1. Prior studies are unavailable for direct comparison. 2. Soft tissue swelling and emphysema within the great toe consistent with soft tissue infection. Mild irregularity of the medial base of the distal phalanx could reflect early osteomyelitis. Consider further evaluation with MRI. 3. 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 Electronically Signed   By: Carey Bullocks M.D.   On: 04/24/2021 12:24   CT Renal Stone Study  Result Date: 04/24/2021 CLINICAL DATA:  Flank pain, kidney stones suspected; left flank pain EXAM: CT ABDOMEN AND PELVIS WITHOUT CONTRAST TECHNIQUE: Multidetector CT imaging of the abdomen and pelvis was performed following the standard protocol without IV contrast. COMPARISON:  None. FINDINGS: Lower chest: No acute abnormality. Hepatobiliary: No focal liver  abnormality is seen. No gallstones, gallbladder wall thickening, or biliary dilatation. Pancreas: Unremarkable. Spleen: Unremarkable. Adrenals/Urinary Tract: Adrenals are unremarkable. No renal calculi or hydronephrosis. Ureters are normal in caliber. Bladder is unremarkable. Stomach/Bowel: Stomach is within normal limits. Bowel is normal in caliber. Normal appendix. Vascular/Lymphatic: Extensive atherosclerosis. No enlarged lymph nodes. Reproductive: Unremarkable. Other: No free fluid. No significant abnormality of the abdominal wall. Musculoskeletal: Degenerative changes of the included spine. IMPRESSION: No acute abnormality.  No urinary tract calculi or hydronephrosis. Extensive atherosclerosis. Electronically Signed   By: Guadlupe Spanish M.D.   On: 04/24/2021 11:32    Anti-infectives: Anti-infectives (From admission, onward)    Start     Dose/Rate Route Frequency Ordered Stop   04/25/21 1500  vancomycin (VANCOREADY) IVPB 1250 mg/250 mL        1,250 mg 166.7 mL/hr over 90 Minutes Intravenous Every 24 hours 04/24/21 1348     04/24/21 2000  meropenem (MERREM) 500 mg in sodium chloride 0.9 % 100 mL IVPB        500 mg 200 mL/hr over 30 Minutes Intravenous Every 8 hours 04/24/21 1337     04/24/21 1430  vancomycin (VANCOREADY) IVPB 2000 mg/400 mL        2,000 mg 200 mL/hr over 120 Minutes Intravenous  Once 04/24/21 1345 04/24/21 1819   04/24/21 1115  ciprofloxacin (CIPRO) IVPB 400 mg        400 mg 200 mL/hr over 60 Minutes Intravenous  Once 04/24/21 1100 04/24/21 1340   04/24/21 1115  clindamycin (CLEOCIN) IVPB 600 mg        600 mg 100 mL/hr over 30  Minutes Intravenous  Once 04/24/21 1100 04/24/21 1226       Assessment/Plan: Impression: Cellulitis of left great toe, peripheral vascular disease, diabetes mellitus, uncontrolled atrial fibrillation. Plan: Patient getting echo.  We will get segmental arterial Doppler study tomorrow to further assess vascular system.  Further management is pending  those results.  Continue IV antibiotics.  LOS: 2 days    Franky Macho 04/26/2021

## 2021-04-26 NOTE — Progress Notes (Signed)
PROGRESS NOTE  Todd Zhang JFH:[id number] DOB: 1946/01/16 DOA: 04/24/2021 PCP: [name], FNP   Brief History:   75 y.o. male with medical history of diabetes mellitus, hypertension, hyperlipidemia, COPD, anxiety, GERD presenting with left flank pain of 1 day duration and 2-week history of left foot pain.  Unfortunately, the patient is a difficult historian at best.   According to the patient, he has had 3 or 4 mechanical falls in the past week secondary to gait instability.  Apparently, the patient visited the emergency department at [facility] on 04/21/21 when the patient found the patient to be driving erratically on the road.  Apparently the patient was somewhat confused.  In the emergency department at [facility], the patient was afebrile with mild tachycardia, HR 115, BP 108/59.  Patient was given normal saline with improvement of his heart rate and blood pressure.  BMP on 04/21/2021 showed sodium 133, potassium 4.4, serum creatinine 2.44.  LFTs were unremarkable.  WBC 11.1, hemoglobin 13.7, platelets 222,000.  Lactic acid was 0.7.  CT of the brain was negative.  Chest x-ray was negative.  UA was negative for pyuria.  After fluid resuscitation, the patient was improved clinically.  He was discharged home in stable condition.   Apparently, the patient called EMS on 04/24/21 because of the above chief complaints.  He stated that he tried to call EMS on 04/23/2021, but he had a mechanical fall resulting in breaking his cell phone.  It is unclear exactly how EMS was activated on the day of this admission.  Nevertheless, the patient states that he has noted increasing pain, edema, and erythema of his left foot in the past 2 weeks.  However, he states that he has had left foot pain and edema for the better part of at least 2 months.  Again, the patient is difficult historian.  He does not recall how he initially injured his foot.  He has been feeling palpitations and fluttering in his chest, but he  denies any frank chest pain, shortness breath, coughing, hemoptysis, nausea, vomiting, direct abdominal pain, dysuria, hematuria.  He denies any syncope, but has had 3-4 mechanical falls.  He denies any headache, visual disturbance, focal extremity weakness. In the emergency department, the patient was afebrile but was tachycardic into 120s.  He was hemodynamically stable.  Oxygen saturation 99-100% room air.  BMP showed sodium 127, potassium 4.4, serum creatinine 1.9.  WBC 14.3, hemoglobin 12.4, platelets 1 88,000.  Lactic acid peaked at 2.2.  CT renal stone protocol was negative for any acute findings or hydronephrosis.  Chest x-ray was negative for any infiltrates or edema.  X-ray of the left foot showed soft tissue swelling and emphysema with the great toe with mild irregularity of the medial base of the distal phalanx.  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  The patient was started on meropenem.  Fluid boluses were given and the patient was started on IVF maintenance.   On 04/25/21, he developed Afib with RVR again with soft BPs.  He was moved to the ICU and started on diltiazem drip.   Assessment/Plan: Severe Sepsis -Present on admission -presented with leukocytosis, tachycardia, tachypnea -Secondary to diabetic foot infection and bacteremia -Given 2 L LR in ED -started on NS -Lactic acid peaked 2.2 -continue vanc and merrem -continues to have fever but trending down   Diabetic foot infection/Acute osteomyelitis L-great toe -X-ray left foot as discussed above -MRI left foot--bone  marrow edema of  LEFT1st distal and proximal phalanges -General surgery consult appreciated -discussed with Dr. Arnoldo Morale -The patient will at least need amputation of his left great toe -ESR-113 -CRP--32.4   Group B Streptococcus Bacteremia -source is diabetic foot infection -repeat blood cultures -Echo  Atrial fibrillation with RVR -Suspect increased rate secondary to sepsis as well and has rebound  tachycardia from not getting metoprolol -Holding rivaroxaban in anticipation of surgery -04/25/21--HR back up with soft BPs -8/13--transferred to SDU and started on diltiazem drip -if EF is preserved, then plan to convert to po diltiazem  Diabetes mellitus type 2 uncontrolled with hyperglycemia -8/12 hemoglobin A1c--8.3 -NovoLog sliding scale -Holding metformin   Essential hypertension -Restart metoprolol succinate -Holding benazepril   CKD 3b -baseline creatinine 1.7-2.0   Hyperlipidemia -Continue statin and fenofibrate   Left flank pain -CT renal stone protocol negative for acute findings -Suspect this is musculoskeletal strain/sprain from the patient's frequent falls -Judicious opioids   Hyponatremia -Secondary to volume depletion and hyperglycemia -Continue normal saline>>improving             Status is: Inpatient   Remains inpatient appropriate because:Inpatient level of care appropriate due to severity of illness   Dispo: The patient is from: Home              Anticipated d/c is to: Home              Patient currently is not medically stable to d/c.              Difficult to place patient No               Family Communication:  no Family at bedside   Consultants:  general surgery   Code Status:  FULL   DVT Prophylaxis:  Xarelto on hold     Procedures: As Listed in Progress Note Above   Antibiotics: Merrem 8/12>> Vanc 8/12>>        Subjective: Patient complains of pain in right foot.  Patient denies fevers, chills, headache, chest pain, dyspnea, nausea, vomiting, diarrhea, abdominal pain, dysuria, hematuria, hematochezia, and melena.   Objective: Vitals:   04/26/21 0400 04/26/21 0500 04/26/21 0600 04/26/21 0728  BP: (!) 114/35 (!) 138/46 (!) 112/47   Pulse: (!) 107 (!) 102 (!) 104 (!) 114  Resp:      Temp: (!) 100.6 F (38.1 C)   99.9 F (37.7 C)  TempSrc: Oral   Oral  SpO2: 92% 90% 92% (!) 82%  Weight:      Height:         Intake/Output Summary (Last 24 hours) at 04/26/2021 0824 Last data filed at 04/26/2021 0400 Gross per 24 hour  Intake 3767.86 ml  Output 1790 ml  Net 1977.86 ml   Weight change: -6.263 kg Exam:  General:  Pt is alert, follows commands appropriately, not in acute distress HEENT: No icterus, No thrush, No neck mass, [place]/AT Cardiovascular: IRRR, S1/S2, no rubs, no gallops Respiratory: R-basilar crackle. No wheeze Abdomen: Soft/+BS, non tender, non distended, no guarding Extremities:1 + LE edema, No lymphangitis, No petechiae, No rashes, no synovitis   Data Reviewed: I have personally reviewed following labs and imaging studies Basic Metabolic Panel: Recent Labs  Lab 04/24/21 1041 04/25/21 0558  NA 127* 133*  K 4.4 4.3  CL 96* 104  CO2 24 24  GLUCOSE 333* 181*  BUN 27* 24*  CREATININE 1.89* 1.79*  CALCIUM 8.7* 8.4*   Liver Function Tests: Recent Labs  Lab  04/24/21 1041  AST 40  ALT 33  ALKPHOS 65  BILITOT 1.1  PROT 6.9  ALBUMIN 3.1*   No results for input(s): LIPASE, AMYLASE in the last 168 hours. No results for input(s): AMMONIA in the last 168 hours. Coagulation Profile: Recent Labs  Lab 04/24/21 1041  INR 1.4*   CBC: Recent Labs  Lab 04/24/21 1041 04/25/21 0558 04/26/21 0701  WBC 14.3* 12.6* 11.3*  NEUTROABS 12.0*  --   --   HGB 12.4* 10.4* 10.9*  HCT 35.7* 31.5* 32.8*  MCV 91.8 94.6 93.2  PLT 188 172 196   Cardiac Enzymes: No results for input(s): CKTOTAL, CKMB, CKMBINDEX, TROPONINI in the last 168 hours. BNP: Invalid input(s): POCBNP CBG: Recent Labs  Lab 04/25/21 0720 04/25/21 1117 04/25/21 1613 04/25/21 2124 04/26/21 0730  GLUCAP 154* 182* 156* 174* 177*   HbA1C: Recent Labs    04/24/21 1414  HGBA1C 8.3*   Urine analysis:    Component Value Date/Time   COLORURINE YELLOW 04/24/2021 1029   APPEARANCEUR HAZY (A) 04/24/2021 1029   LABSPEC 1.018 04/24/2021 1029   PHURINE 5.0 04/24/2021 1029   GLUCOSEU >=500 (A) 04/24/2021  1029   HGBUR MODERATE (A) 04/24/2021 1029   BILIRUBINUR NEGATIVE 04/24/2021 1029   BILIRUBINUR neg 03/04/2020 1437   KETONESUR NEGATIVE 04/24/2021 1029   PROTEINUR 30 (A) 04/24/2021 1029   UROBILINOGEN negative (A) 03/04/2020 1437   NITRITE NEGATIVE 04/24/2021 1029   LEUKOCYTESUR NEGATIVE 04/24/2021 1029   Sepsis Labs: _0 (procalcitonin:4,lacticidven:4) ) Recent Results (from the past 240 hour(s))  Blood culture (routine single)     Status: None (Preliminary result)   Collection Time: 04/24/21 10:42 AM   Specimen: Right Antecubital; Blood  Result Value Ref Range Status   Specimen Description   Final    RIGHT ANTECUBITAL Performed at [facility], [address], [place], [place] [address]    Special Requests   Final    BOTTLES DRAWN AEROBIC AND ANAEROBIC Blood Culture adequate volume Performed at [facility], [address], [place], [place] [address]    Culture  Setup Time   Final    GRAM POSITIVE COCCI Gram Stain Report Called to,Read Back By and Verified With: BRAME,M @ 2048 04/24/21 BY STEPHTR IN BOTH AEROBIC AND ANAEROBIC BOTTLES CRITICAL RESULT CALLED TO, READ BACK BY AND VERIFIED WITH: RN N.MOODY AT 0146 ON 04/25/2021 BY T.SAAD. Performed at [facility], [place]  [address], [place], [place] [address]    Culture GRAM POSITIVE COCCI  Final   Report Status PENDING  Incomplete  Blood Culture ID Panel (Reflexed)     Status: Abnormal   Collection Time: 04/24/21 10:42 AM  Result Value Ref Range Status   Enterococcus faecalis NOT DETECTED NOT DETECTED Final   Enterococcus Faecium NOT DETECTED NOT DETECTED Final   Listeria monocytogenes NOT DETECTED NOT DETECTED Final   Staphylococcus species NOT DETECTED NOT DETECTED Final   Staphylococcus aureus (BCID) NOT DETECTED NOT DETECTED Final   Staphylococcus epidermidis NOT DETECTED NOT DETECTED Final   Staphylococcus lugdunensis NOT DETECTED NOT DETECTED Final   Streptococcus species DETECTED (A) NOT DETECTED Final     Comment: CRITICAL RESULT CALLED TO, READ BACK BY AND VERIFIED WITH: RN N.MOODY AT 0146 ON 04/25/2021 BY T.SAAD.    Streptococcus agalactiae DETECTED (A) NOT DETECTED Final    Comment: CRITICAL RESULT CALLED TO, READ BACK BY AND VERIFIED WITH: RN N.MOODY AT 0146 ON 04/25/2021 BY T.SAAD.    Streptococcus pneumoniae NOT DETECTED NOT DETECTED Final  Streptococcus pyogenes NOT DETECTED NOT DETECTED Final   A.calcoaceticus-baumannii NOT DETECTED NOT DETECTED Final   Bacteroides fragilis NOT DETECTED NOT DETECTED Final   Enterobacterales NOT DETECTED NOT DETECTED Final   Enterobacter cloacae complex NOT DETECTED NOT DETECTED Final   Escherichia coli NOT DETECTED NOT DETECTED Final   Klebsiella aerogenes NOT DETECTED NOT DETECTED Final   Klebsiella oxytoca NOT DETECTED NOT DETECTED Final   Klebsiella pneumoniae NOT DETECTED NOT DETECTED Final   Proteus species NOT DETECTED NOT DETECTED Final   Salmonella species NOT DETECTED NOT DETECTED Final   Serratia marcescens NOT DETECTED NOT DETECTED Final   Haemophilus influenzae NOT DETECTED NOT DETECTED Final   Neisseria meningitidis NOT DETECTED NOT DETECTED Final   Pseudomonas aeruginosa NOT DETECTED NOT DETECTED Final   Stenotrophomonas maltophilia NOT DETECTED NOT DETECTED Final   Candida albicans NOT DETECTED NOT DETECTED Final   Candida auris NOT DETECTED NOT DETECTED Final   Candida glabrata NOT DETECTED NOT DETECTED Final   Candida krusei NOT DETECTED NOT DETECTED Final   Candida parapsilosis NOT DETECTED NOT DETECTED Final   Candida tropicalis NOT DETECTED NOT DETECTED Final   Cryptococcus neoformans/gattii NOT DETECTED NOT DETECTED Final    Comment: Performed at [facility], [address] [place], [place] [address]  Urine Culture     Status: Abnormal   Collection Time: 04/24/21 11:20 AM   Specimen: In/Out Cath Urine  Result Value Ref Range Status   Specimen Description   Final    IN/OUT CATH URINE Performed at [facility], [address], [place], [place] [address]    Special Requests   Final    NONE Performed at [facility], [address], [place], [place] [address]    Culture (A)  Final    10,000 COLONIES/mL GROUP B STREP(S.AGALACTIAE)ISOLATED TESTING AGAINST S. AGALACTIAE NOT ROUTINELY PERFORMED DUE TO PREDICTABILITY OF AMP/PEN/VAN SUSCEPTIBILITY. Performed at [facility], [place] [address], [place], [place] [address]    Report Status 04/25/2021 FINAL  Final  Resp Panel by RT-PCR (Flu A&B, Covid) Nasopharyngeal Swab     Status: None   Collection Time: 04/24/21  1:15 PM   Specimen: Nasopharyngeal Swab; Nasopharyngeal(NP) swabs in vial transport medium  Result Value Ref Range Status   SARS Coronavirus 2 by RT PCR NEGATIVE NEGATIVE Final    Comment: (NOTE) SARS-CoV-2 target nucleic acids are NOT DETECTED.  The SARS-CoV-2 RNA is generally detectable in upper respiratory specimens during the acute phase of infection. The lowest concentration of SARS-CoV-2 viral copies this assay can detect is 138 copies/mL. A negative result does not preclude SARS-Cov-2 infection and should not be used as the sole basis for treatment or other patient management decisions. A negative result may occur with  improper specimen collection/handling, submission of specimen other than nasopharyngeal swab, presence of viral mutation(s) within the areas targeted by this assay, and inadequate number of viral copies(<138 copies/mL). A negative result must be combined with clinical observations, patient history, and epidemiological information. The expected result is Negative.  Fact Sheet for Patients:  EntrepreneurPulse.com.au  Fact Sheet for Healthcare Providers:  IncredibleEmployment.be  This test is no t yet approved or cleared by the [place] FDA and  has been authorized for detection and/or diagnosis of SARS-CoV-2 by FDA under an Emergency Use Authorization (EUA). This EUA  will remain  in effect (meaning this test can be used) for the duration of the COVID-19 declaration under Section 564(b)(1) of the Act, 21 U.S.C.section 360bbb-3(b)(1), unless  the authorization is terminated  or revoked sooner.       Influenza A by PCR NEGATIVE NEGATIVE Final   Influenza B by PCR NEGATIVE NEGATIVE Final    Comment: (NOTE) The Xpert Xpress SARS-CoV-2/FLU/RSV plus assay is intended as an aid in the diagnosis of influenza from Nasopharyngeal swab specimens and should not be used as a sole basis for treatment. Nasal washings and aspirates are unacceptable for Xpert Xpress SARS-CoV-2/FLU/RSV testing.  Fact Sheet for Patients: EntrepreneurPulse.com.au  Fact Sheet for Healthcare Providers: IncredibleEmployment.be  This test is not yet approved or cleared by the [place] FDA and has been authorized for detection and/or diagnosis of SARS-CoV-2 by FDA under an Emergency Use Authorization (EUA). This EUA will remain in effect (meaning this test can be used) for the duration of the COVID-19 declaration under Section 564(b)(1) of the Act, 21 U.S.C. section 360bbb-3(b)(1), unless the authorization is terminated or revoked.  Performed at [facility], [address] [place], [place] [address]   Culture, blood (Routine X 2) w Reflex to ID Panel     Status: None (Preliminary result)   Collection Time: 04/25/21  7:26 AM   Specimen: BLOOD RIGHT ARM  Result Value Ref Range Status   Specimen Description BLOOD RIGHT ARM  Final   Special Requests   Final    BOTTLES DRAWN AEROBIC AND ANAEROBIC Blood Culture adequate volume Performed at [facility], [address], [place], [place] [address]    Culture PENDING  Incomplete   Report Status PENDING  Incomplete  Culture, blood (Routine X 2) w Reflex to ID Panel     Status: None (Preliminary result)   Collection Time: 04/25/21  7:32 AM   Specimen: BLOOD RIGHT ARM  Result Value Ref Range Status    Specimen Description BLOOD RIGHT ARM  Final   Special Requests   Final    BOTTLES DRAWN AEROBIC AND ANAEROBIC Blood Culture adequate volume Performed at [facility], [address] [place], [place] [address]    Culture PENDING  Incomplete   Report Status PENDING  Incomplete     Scheduled Meds:  atorvastatin  40 mg Oral Daily   Chlorhexidine Gluconate Cloth  6 each Topical Daily   gabapentin  300 mg Oral TID   insulin aspart  0-5 Units Subcutaneous QHS   insulin aspart  0-9 Units Subcutaneous TID WC   loratadine  10 mg Oral Daily   metoprolol succinate  50 mg Oral Daily   multivitamin with minerals  1 tablet Oral Daily   pantoprazole  40 mg Oral Daily   tamsulosin  0.4 mg Oral Daily   vitamin B-12  500 mcg Oral Daily   Continuous Infusions:  sodium chloride 75 mL/hr at 04/25/21 2006   diltiazem (CARDIZEM) infusion 15 mg/hr (04/26/21 0317)   meropenem (MERREM) IV 500 mg (04/26/21 0320)   vancomycin 1,250 mg (04/25/21 1526)    Procedures/Studies: MR FOOT LEFT WO CONTRAST  Result Date: 04/24/2021 CLINICAL DATA:  Foot swelling.  Left great toe redness and swelling. EXAM: MRI OF THE LEFT FOOT WITHOUT CONTRAST TECHNIQUE: Multiplanar, multisequence MR imaging of the left foot was performed. No intravenous contrast was administered. COMPARISON:  None. FINDINGS: Bones/Joint/Cartilage Soft tissue wound along the medial aspect of the distal great toe. Bone marrow edema and bone destruction of the base of the first distal phalanx most concerning for osteomyelitis. Bone marrow edema in the first proximal phalanx which may be reactive versus secondary to osteomyelitis. Severe osteoarthritis of the first  MTP joint with subchondral reactive marrow edema. Normal alignment. No joint effusion. Ligaments Collateral ligaments are intact.  Lisfranc ligament is intact. Muscles and Tendons Flexor, peroneal and extensor compartment tendons are intact. Generalized muscle atrophy. Soft tissue No fluid  collection or hematoma. 6 mm T2 hyperintense soft tissue nodule along the plantar aspect of the foot at the level of the metatarsal neck between the first and second metatarsals which may reflect a small neuroma or vascular malformation. IMPRESSION: 1. Soft tissue wound along the medial aspect of the distal great toe. Bone marrow edema and bone destruction of the base of the first distal phalanx most concerning for osteomyelitis. Bone marrow edema in the first proximal phalanx which may be reactive versus secondary to osteomyelitis. 2. Severe osteoarthritis of the first MTP joint with subchondral reactive marrow edema. Electronically Signed   By: [name] M.D.   On: 04/24/2021 15:04   DG CHEST PORT 1 VIEW  Result Date: 04/25/2021 CLINICAL DATA:  Sepsis, shortness of breath EXAM: PORTABLE CHEST 1 VIEW COMPARISON:  04/24/2021 FINDINGS: Stable cardiomediastinal contours. Atherosclerotic calcification of the aortic knob. Slightly increasing interstitial markings bilaterally. No lobar consolidation. No pleural effusion or pneumothorax. IMPRESSION: Slightly increasing interstitial markings bilaterally may reflect mild edema or atypical/viral infection. Electronically Signed   By: Davina Poke D.O.   On: 04/25/2021 17:21   DG Chest Port 1 View  Result Date: 04/24/2021 CLINICAL DATA:  Left flank pain. Possible sepsis. History of diabetes, hypertension and atrial fibrillation. EXAM: PORTABLE CHEST 1 VIEW COMPARISON:  Radiographs 04/21/2021 and 07/03/2020. FINDINGS: 1137 hours. The heart size and mediastinal contours are stable with aortic atherosclerosis. The lungs appear clear. There is no pleural effusion or pneumothorax. No acute osseous findings are evident. There are degenerative changes in the spine associated with a mild scoliosis. Telemetry leads overlie the chest. IMPRESSION: Stable chest.  No acute cardiopulmonary process. Electronically Signed   By: Richardean Sale M.D.   On: 04/24/2021 12:20    DG Foot Complete Left  Result Date: 04/24/2021 CLINICAL DATA:  Left flank pain. Left great toe erythema. Question osteomyelitis. EXAM: LEFT FOOT - COMPLETE 3+ VIEW COMPARISON:  Reports from left foot radiographs 03/20/2019 and 03/21/2019. 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 The distal 2nd phalanx appears eroded without acute cortical destruction. There is soft tissue swelling and soft tissue emphysema of the great toe. There is possible cortical destruction involving the medial base of the distal 1st phalanx. Moderate degenerative changes are present at the 1st metatarsophalangeal joint. IMPRESSION: 1. Prior studies are unavailable for direct comparison. 2. Soft tissue swelling and emphysema within the great toe consistent with soft tissue infection. Mild irregularity of the medial base of the distal phalanx could reflect early osteomyelitis. Consider further evaluation with MRI. 3. 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 Electronically Signed   By: Richardean Sale M.D.   On: 04/24/2021 12:24   CT Renal Stone Study  Result Date: 04/24/2021 CLINICAL DATA:  Flank pain, kidney stones suspected; left flank pain EXAM: CT ABDOMEN AND PELVIS WITHOUT CONTRAST TECHNIQUE: Multidetector CT imaging of the abdomen and pelvis was performed following the standard protocol without IV contrast. COMPARISON:  None. FINDINGS: Lower chest: No acute abnormality. Hepatobiliary: No focal liver abnormality is seen. No gallstones, gallbladder wall thickening, or  biliary dilatation. Pancreas: Unremarkable. Spleen: Unremarkable. Adrenals/Urinary Tract: Adrenals are unremarkable. No renal calculi or hydronephrosis. Ureters are normal in caliber. Bladder is unremarkable.  Stomach/Bowel: Stomach is within normal limits. Bowel is normal in caliber. Normal appendix. Vascular/Lymphatic: Extensive atherosclerosis. No enlarged lymph nodes. Reproductive: Unremarkable. Other: No free fluid. No significant abnormality of the abdominal wall. Musculoskeletal: Degenerative changes of the included spine. IMPRESSION: No acute abnormality.  No urinary tract calculi or hydronephrosis. Extensive atherosclerosis. Electronically Signed   By: Macy Mis M.D.   On: 04/24/2021 11:32    Orson Eva, DO  [facility]  If 7PM-7AM, please contact night-coverage [email or web address] Password [email or web address] 04/26/2021, 8:24 AM   LOS: 2 days

## 2021-04-26 NOTE — Progress Notes (Signed)
  Echocardiogram 2D Echocardiogram has been performed.  Todd Zhang 04/26/2021, 9:04 AM

## 2021-04-27 ENCOUNTER — Telehealth: Payer: Self-pay | Admitting: *Deleted

## 2021-04-27 ENCOUNTER — Inpatient Hospital Stay (HOSPITAL_COMMUNITY): Payer: Medicare Other

## 2021-04-27 ENCOUNTER — Ambulatory Visit: Payer: Medicare Other | Admitting: Nurse Practitioner

## 2021-04-27 ENCOUNTER — Telehealth: Payer: Medicare Other | Admitting: *Deleted

## 2021-04-27 DIAGNOSIS — I482 Chronic atrial fibrillation, unspecified: Secondary | ICD-10-CM

## 2021-04-27 DIAGNOSIS — N1832 Chronic kidney disease, stage 3b: Secondary | ICD-10-CM | POA: Diagnosis not present

## 2021-04-27 DIAGNOSIS — L02619 Cutaneous abscess of unspecified foot: Secondary | ICD-10-CM | POA: Diagnosis not present

## 2021-04-27 DIAGNOSIS — A401 Sepsis due to streptococcus, group B: Secondary | ICD-10-CM | POA: Diagnosis not present

## 2021-04-27 DIAGNOSIS — M869 Osteomyelitis, unspecified: Secondary | ICD-10-CM

## 2021-04-27 DIAGNOSIS — I4891 Unspecified atrial fibrillation: Secondary | ICD-10-CM | POA: Diagnosis not present

## 2021-04-27 DIAGNOSIS — M86172 Other acute osteomyelitis, left ankle and foot: Secondary | ICD-10-CM | POA: Diagnosis not present

## 2021-04-27 DIAGNOSIS — L03119 Cellulitis of unspecified part of limb: Secondary | ICD-10-CM | POA: Diagnosis not present

## 2021-04-27 LAB — GLUCOSE, CAPILLARY
Glucose-Capillary: 201 mg/dL — ABNORMAL HIGH (ref 70–99)
Glucose-Capillary: 239 mg/dL — ABNORMAL HIGH (ref 70–99)
Glucose-Capillary: 188 mg/dL — ABNORMAL HIGH (ref 70–99)
Glucose-Capillary: 205 mg/dL — ABNORMAL HIGH (ref 70–99)

## 2021-04-27 LAB — BASIC METABOLIC PANEL
Anion gap: 8 (ref 5–15)
BUN: 19 mg/dL (ref 8–23)
CO2: 23 mmol/L (ref 22–32)
Calcium: 7.7 mg/dL — ABNORMAL LOW (ref 8.9–10.3)
Chloride: 100 mmol/L (ref 98–111)
Creatinine, Ser: 1.31 mg/dL — ABNORMAL HIGH (ref 0.61–1.24)
GFR, Estimated: 57 mL/min — ABNORMAL LOW (ref >60)
Glucose, Bld: 169 mg/dL — ABNORMAL HIGH (ref 70–99)
Potassium: 3.8 mmol/L (ref 3.5–5.1)
Sodium: 131 mmol/L — ABNORMAL LOW (ref 135–145)

## 2021-04-27 LAB — CULTURE, BLOOD (SINGLE): Special Requests: ADEQUATE

## 2021-04-27 LAB — CBC
HCT: 32.9 % — ABNORMAL LOW (ref 39.0–52.0)
Hemoglobin: 10.9 g/dL — ABNORMAL LOW (ref 13.0–17.0)
MCH: 31.1 pg (ref 26.0–34.0)
MCHC: 33.1 g/dL (ref 30.0–36.0)
MCV: 93.7 fL (ref 80.0–100.0)
Platelets: 208 10*3/uL (ref 150–400)
RBC: 3.51 MIL/uL — ABNORMAL LOW (ref 4.22–5.81)
RDW: 13.1 % (ref 11.5–15.5)
WBC: 9.3 10*3/uL (ref 4.0–10.5)
nRBC: 0 % (ref 0.0–0.2)

## 2021-04-27 LAB — MAGNESIUM: Magnesium: 1.8 mg/dL (ref 1.7–2.4)

## 2021-04-27 MED ORDER — METOPROLOL SUCCINATE ER 50 MG PO TB24
50.0000 mg | ORAL_TABLET | Freq: Once | ORAL | Status: AC
  Administered 2021-04-27: 50 mg via ORAL
  Filled 2021-04-27: qty 1

## 2021-04-27 MED ORDER — SODIUM CHLORIDE 0.9 % IV SOLN
1.0000 g | Freq: Three times a day (TID) | INTRAVENOUS | Status: DC
  Administered 2021-04-27 – 2021-04-28 (×3): 1 g via INTRAVENOUS
  Filled 2021-04-27 (×5): qty 1

## 2021-04-27 MED ORDER — METOPROLOL SUCCINATE ER 100 MG PO TB24
100.0000 mg | ORAL_TABLET | Freq: Every day | ORAL | Status: DC
  Administered 2021-04-28 – 2021-05-09 (×12): 100 mg via ORAL
  Filled 2021-04-27: qty 2
  Filled 2021-04-27 (×2): qty 1
  Filled 2021-04-27: qty 2
  Filled 2021-04-27: qty 1
  Filled 2021-04-27: qty 2
  Filled 2021-04-27 (×6): qty 1

## 2021-04-27 MED ORDER — DILTIAZEM HCL ER COATED BEADS 120 MG PO CP24
240.0000 mg | ORAL_CAPSULE | Freq: Every day | ORAL | Status: DC
  Administered 2021-04-27 – 2021-05-09 (×13): 240 mg via ORAL
  Filled 2021-04-27 (×9): qty 2
  Filled 2021-04-27: qty 1
  Filled 2021-04-27 (×3): qty 2

## 2021-04-27 NOTE — Progress Notes (Signed)
US arterial doppler study shows significant bilateral lower extremity peripheral vascular disease with monophasic wave forms.  Concerning also for aortoiliac disease.  Recommend referral to Vascular Surgery for further evaluation and treatment.  Discussed with Dr. Arbutus Leas.

## 2021-04-27 NOTE — Progress Notes (Signed)
Report given to [facility] and transport for this patient.  The patient has been updated that he will be transported to [facility] this PM.  His family has been updated as well.  'The patient is free of distress and VS are stable.  Awaiting transport at this time.

## 2021-04-27 NOTE — Telephone Encounter (Signed)
04/27/2021  Patient scheduled for a Chronic Care management telephone call today but remains hospitalized at [facility] since 04/24/21. He is currently in ICU for uncontrolled Afib and is being treated for sepsis and acute osteomyelitis L-great toe. No plans for discharge at this time. RN Care Manager will follow-up with patient within 3 business days of discharge.   [name], BSN, RN-BC Embedded Chronic Care Manager [facility] Care Management Direct Dial: [phone]

## 2021-04-27 NOTE — Progress Notes (Signed)
PROGRESS NOTE  Todd Zhang NWG:[id number] DOB: July 14, 1946 DOA: 04/24/2021 PCP: [name], FNP    Brief History:   75 y.o. male with medical history of diabetes mellitus, hypertension, hyperlipidemia, COPD, anxiety, GERD presenting with left flank pain of 1 day duration and 2-week history of left foot pain.  Unfortunately, the patient is a difficult historian at best.   According to the patient, he has had 3 or 4 mechanical falls in the past week secondary to gait instability.  Apparently, the patient visited the emergency department at [facility] on 04/21/21 when the patient found the patient to be driving erratically on the road.  Apparently the patient was somewhat confused.  In the emergency department at [facility], the patient was afebrile with mild tachycardia, HR 115, BP 108/59.  Patient was given normal saline with improvement of his heart rate and blood pressure.  BMP on 04/21/2021 showed sodium 133, potassium 4.4, serum creatinine 2.44.  LFTs were unremarkable.  WBC 11.1, hemoglobin 13.7, platelets 222,000.  Lactic acid was 0.7.  CT of the brain was negative.  Chest x-ray was negative.  UA was negative for pyuria.  After fluid resuscitation, the patient was improved clinically.  He was discharged home in stable condition.   Apparently, the patient called EMS on 04/24/21 because of the above chief complaints.  He stated that he tried to call EMS on 04/23/2021, but he had a mechanical fall resulting in breaking his cell phone.  It is unclear exactly how EMS was activated on the day of this admission.  Nevertheless, the patient states that he has noted increasing pain, edema, and erythema of his left foot in the past 2 weeks.  However, he states that he has had left foot pain and edema for the better part of at least 2 months.  Again, the patient is difficult historian.  He does not recall how he initially injured his foot.  He has been feeling palpitations and fluttering in his chest, but he  denies any frank chest pain, shortness breath, coughing, hemoptysis, nausea, vomiting, direct abdominal pain, dysuria, hematuria.  He denies any syncope, but has had 3-4 mechanical falls.  He denies any headache, visual disturbance, focal extremity weakness. In the emergency department, the patient was afebrile but was tachycardic into 120s.  He was hemodynamically stable.  Oxygen saturation 99-100% room air.  BMP showed sodium 127, potassium 4.4, serum creatinine 1.9.  WBC 14.3, hemoglobin 12.4, platelets 1 88,000.  Lactic acid peaked at 2.2.  CT renal stone protocol was negative for any acute findings or hydronephrosis.  Chest x-ray was negative for any infiltrates or edema.  X-ray of the left foot showed soft tissue swelling and emphysema with the great toe with mild irregularity of the medial base of the distal phalanx.  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  The patient was started on meropenem.  Fluid boluses were given and the patient was started on IVF maintenance.   On 04/25/21, he developed Afib with RVR again with soft BPs.  He was moved to the ICU and started on diltiazem drip.   Assessment/Plan: Severe Sepsis -Present on admission -presented with leukocytosis, tachycardia, tachypnea -Secondary to diabetic foot infection and bacteremia -Given 2 L LR in ED -started on NS -Lactic acid peaked 2.2 -continue vanc and merrem -continues to have fever but trending down -d/c vanc -continue merrem   Diabetic foot infection/Acute osteomyelitis L-great toe -X-ray left foot as  discussed above -MRI left foot--bone marrow edema of  LEFT1st distal and proximal phalanges -General surgery consult appreciated -discussed with Dr. Arnoldo Morale -The patient will at least need amputation of his left great toe -ESR-113 -CRP--32.4 -04/27/21 ABI--monophasic waveforms in posterior tibial and doralis pedis suggestin PAD -04/27/21--discussed with [facility], Dr. Jerold Coombe to see in consult after transfer to  [place] notify [facility] after arrvial   Group B Streptococcus Bacteremia -source is diabetic foot infection -repeat blood cultures--neg -Echo--no vegetation; EF 60-65%, no WMA   Atrial fibrillation with RVR -Suspect increased rate secondary to sepsis as well and has rebound tachycardia from not getting metoprolol -Holding rivaroxaban in anticipation of surgery -04/25/21--HR back up with soft BPs -8/13--transferred to SDU and started on diltiazem drip --Echo--no vegetation; EF 60-65%, no WMA -04/26/21--transitioned to po dilitazem   Diabetes mellitus type 2 uncontrolled with hyperglycemia -8/12 hemoglobin A1c--8.3 -NovoLog sliding scale -Holding metformin   Essential hypertension -Restart metoprolol succinate -Holding benazepril   CKD 3b -baseline creatinine 1.7-2.0   Hyperlipidemia -Continue statin and fenofibrate   Left flank pain -CT renal stone protocol negative for acute findings -Suspect this is musculoskeletal strain/sprain from the patient's frequent falls -Judicious opioids   Hyponatremia -Secondary to volume depletion and hyperglycemia -Continue normal saline>>improving             Status is: Inpatient   Remains inpatient appropriate because:Inpatient level of care appropriate due to severity of illness   Dispo: The patient is from: Home              Anticipated d/c is to: Home              Patient currently is not medically stable to d/c.              Difficult to place patient No               Family Communication:  no Family at bedside   Consultants:  general surgery, vascular surgery   Code Status:  FULL   DVT Prophylaxis:  Xarelto on hold     Procedures: As Listed in Progress Note Above   Antibiotics: Merrem 8/12>> Vanc 8/12>>      Subjective: Patient denies fevers, chills, headache, chest pain, dyspnea, nausea, vomiting, diarrhea, abdominal pain, dysuria, hematuria, hematochezia, and melena.   Objective: Vitals:    04/27/21 0800 04/27/21 1000 04/27/21 1100 04/27/21 1200  BP: (!) 158/92 (!) 124/45 (!) 153/68 138/63  Pulse: 95 94 (!) 109 (!) 104  Resp: (!) 23 (!) 21 (!) 24 (!) 25  Temp:   98 F (36.7 C)   TempSrc:   Oral   SpO2: 99% 92% 97% 92%  Weight:      Height:        Intake/Output Summary (Last 24 hours) at 04/27/2021 1451 Last data filed at 04/27/2021 0521 Gross per 24 hour  Intake 539.57 ml  Output 950 ml  Net -410.43 ml   Weight change:  Exam:  General:  Pt is alert, follows commands appropriately, not in acute distress HEENT: No icterus, No thrush, No neck mass, [place]/AT Cardiovascular: RRR, S1/S2, no rubs, no gallops Respiratory: fine bibasilar crackles. No wheeze Abdomen: Soft/+BS, non tender, non distended, no guarding Extremities: trace LE edema, No lymphangitis, No petechiae, No rashes, no synovitis   Data Reviewed: I have personally reviewed following labs and imaging studies Basic Metabolic Panel: Recent Labs  Lab 04/24/21 1041 04/25/21 0558 04/26/21 0701 04/27/21 0403  NA 127* 133* 131* 131*  K  4.4 4.3 3.8 3.8  CL 96* 104 103 100  CO2 _0 GLUCOSE 333* 181* 194* 169*  BUN 27* 24* 21 19  CREATININE 1.89* 1.79* 1.55* 1.31*  CALCIUM 8.7* 8.4* 7.9* 7.7*  MG  --   --  1.6* 1.8   Liver Function Tests: Recent Labs  Lab 04/24/21 1041 04/26/21 0701  AST 40 36  ALT 33 29  ALKPHOS 65 80  BILITOT 1.1 0.8  PROT 6.9 6.4*  ALBUMIN 3.1* 2.5*   No results for input(s): LIPASE, AMYLASE in the last 168 hours. No results for input(s): AMMONIA in the last 168 hours. Coagulation Profile: Recent Labs  Lab 04/24/21 1041  INR 1.4*   CBC: Recent Labs  Lab 04/24/21 1041 04/25/21 0558 04/26/21 0701 04/27/21 0403  WBC 14.3* 12.6* 11.3* 9.3  NEUTROABS 12.0*  --   --   --   HGB 12.4* 10.4* 10.9* 10.9*  HCT 35.7* 31.5* 32.8* 32.9*  MCV 91.8 94.6 93.2 93.7  PLT 188 172 196 208   Cardiac Enzymes: No results for input(s): CKTOTAL, CKMB, CKMBINDEX, TROPONINI in  the last 168 hours. BNP: Invalid input(s): POCBNP CBG: Recent Labs  Lab 04/26/21 1114 04/26/21 1703 04/26/21 2208 04/27/21 0743 04/27/21 1135  GLUCAP 179* 197* 196* 201* 239*   HbA1C: No results for input(s): HGBA1C in the last 72 hours. Urine analysis:    Component Value Date/Time   COLORURINE YELLOW 04/24/2021 1029   APPEARANCEUR HAZY (A) 04/24/2021 1029   LABSPEC 1.018 04/24/2021 1029   PHURINE 5.0 04/24/2021 1029   GLUCOSEU >=500 (A) 04/24/2021 1029   HGBUR MODERATE (A) 04/24/2021 1029   BILIRUBINUR NEGATIVE 04/24/2021 1029   BILIRUBINUR neg 03/04/2020 1437   KETONESUR NEGATIVE 04/24/2021 1029   PROTEINUR 30 (A) 04/24/2021 1029   UROBILINOGEN negative (A) 03/04/2020 1437   NITRITE NEGATIVE 04/24/2021 1029   LEUKOCYTESUR NEGATIVE 04/24/2021 1029   Sepsis Labs: _1 (procalcitonin:4,lacticidven:4) ) Recent Results (from the past 240 hour(s))  Blood culture (routine single)     Status: Abnormal   Collection Time: 04/24/21 10:42 AM   Specimen: Right Antecubital; Blood  Result Value Ref Range Status   Specimen Description   Final    RIGHT ANTECUBITAL Performed at [facility], [address] [place], [place] [address]    Special Requests   Final    BOTTLES DRAWN AEROBIC AND ANAEROBIC Blood Culture adequate volume Performed at [facility], [address] [place], [place] [address]    Culture  Setup Time   Final    GRAM POSITIVE COCCI Gram Stain Report Called to,Read Back By and Verified With: BRAME,M @ 2048 04/24/21 BY STEPHTR IN BOTH AEROBIC AND ANAEROBIC BOTTLES CRITICAL RESULT CALLED TO, READ BACK BY AND VERIFIED WITH: RN N.MOODY AT 0146 ON 04/25/2021 BY T.SAAD. Performed at [facility], [place] [address] [place], [place] [address]    Culture GROUP B STREP(S.AGALACTIAE)ISOLATED (A)  Final   Report Status 04/27/2021 FINAL  Final   Organism ID, Bacteria GROUP B STREP(S.AGALACTIAE)ISOLATED  Final      Susceptibility   Group b  strep(s.agalactiae)isolated - MIC*    CLINDAMYCIN >=1 RESISTANT Resistant     AMPICILLIN <=0.25 SENSITIVE Sensitive     ERYTHROMYCIN >=8 RESISTANT Resistant     VANCOMYCIN 0.5 SENSITIVE Sensitive     CEFTRIAXONE <=0.12 SENSITIVE Sensitive     LEVOFLOXACIN 0.5 SENSITIVE Sensitive     PENICILLIN Value in next row Sensitive      SENSITIVE0.06    *  GROUP B STREP(S.AGALACTIAE)ISOLATED  Blood Culture ID Panel (Reflexed)     Status: Abnormal   Collection Time: 04/24/21 10:42 AM  Result Value Ref Range Status   Enterococcus faecalis NOT DETECTED NOT DETECTED Final   Enterococcus Faecium NOT DETECTED NOT DETECTED Final   Listeria monocytogenes NOT DETECTED NOT DETECTED Final   Staphylococcus species NOT DETECTED NOT DETECTED Final   Staphylococcus aureus (BCID) NOT DETECTED NOT DETECTED Final   Staphylococcus epidermidis NOT DETECTED NOT DETECTED Final   Staphylococcus lugdunensis NOT DETECTED NOT DETECTED Final   Streptococcus species DETECTED (A) NOT DETECTED Final    Comment: CRITICAL RESULT CALLED TO, READ BACK BY AND VERIFIED WITH: RN N.MOODY AT 0146 ON 04/25/2021 BY T.SAAD.    Streptococcus agalactiae DETECTED (A) NOT DETECTED Final    Comment: CRITICAL RESULT CALLED TO, READ BACK BY AND VERIFIED WITH: RN N.MOODY AT 0146 ON 04/25/2021 BY T.SAAD.    Streptococcus pneumoniae NOT DETECTED NOT DETECTED Final   Streptococcus pyogenes NOT DETECTED NOT DETECTED Final   A.calcoaceticus-baumannii NOT DETECTED NOT DETECTED Final   Bacteroides fragilis NOT DETECTED NOT DETECTED Final   Enterobacterales NOT DETECTED NOT DETECTED Final   Enterobacter cloacae complex NOT DETECTED NOT DETECTED Final   Escherichia coli NOT DETECTED NOT DETECTED Final   Klebsiella aerogenes NOT DETECTED NOT DETECTED Final   Klebsiella oxytoca NOT DETECTED NOT DETECTED Final   Klebsiella pneumoniae NOT DETECTED NOT DETECTED Final   Proteus species NOT DETECTED NOT DETECTED Final   Salmonella species NOT DETECTED  NOT DETECTED Final   Serratia marcescens NOT DETECTED NOT DETECTED Final   Haemophilus influenzae NOT DETECTED NOT DETECTED Final   Neisseria meningitidis NOT DETECTED NOT DETECTED Final   Pseudomonas aeruginosa NOT DETECTED NOT DETECTED Final   Stenotrophomonas maltophilia NOT DETECTED NOT DETECTED Final   Candida albicans NOT DETECTED NOT DETECTED Final   Candida auris NOT DETECTED NOT DETECTED Final   Candida glabrata NOT DETECTED NOT DETECTED Final   Candida krusei NOT DETECTED NOT DETECTED Final   Candida parapsilosis NOT DETECTED NOT DETECTED Final   Candida tropicalis NOT DETECTED NOT DETECTED Final   Cryptococcus neoformans/gattii NOT DETECTED NOT DETECTED Final    Comment: Performed at [facility], [address] [place], [place] [address]  Urine Culture     Status: Abnormal   Collection Time: 04/24/21 11:20 AM   Specimen: In/Out Cath Urine  Result Value Ref Range Status   Specimen Description   Final    IN/OUT CATH URINE Performed at [facility], [address] [place], [place] [address]    Special Requests   Final    NONE Performed at [facility], [address] [place], [place] [address]    Culture (A)  Final    10,000 COLONIES/mL GROUP B STREP(S.AGALACTIAE)ISOLATED TESTING AGAINST S. AGALACTIAE NOT ROUTINELY PERFORMED DUE TO PREDICTABILITY OF AMP/PEN/VAN SUSCEPTIBILITY. Performed at [facility], [address] [place], [place] [address]    Report Status 04/25/2021 FINAL  Final  Resp Panel by RT-PCR (Flu A&B, Covid) Nasopharyngeal Swab     Status: None   Collection Time: 04/24/21  1:15 PM   Specimen: Nasopharyngeal Swab; Nasopharyngeal(NP) swabs in vial transport medium  Result Value Ref Range Status   SARS Coronavirus 2 by RT PCR NEGATIVE NEGATIVE Final    Comment: (NOTE) SARS-CoV-2 target nucleic acids are NOT DETECTED.  The SARS-CoV-2 RNA is generally detectable in upper respiratory specimens during the acute phase of infection. The  lowest concentration of   SARS-CoV-2 viral copies this assay can detect is 138 copies/mL. A negative result does not preclude SARS-Cov-2 infection and should not be used as the sole basis for treatment or other patient management decisions. A negative result may occur with  improper specimen collection/handling, submission of specimen other than nasopharyngeal swab, presence of viral mutation(s) within the areas targeted by this assay, and inadequate number of viral copies(<138 copies/mL). A negative result must be combined with clinical observations, patient history, and epidemiological information. The expected result is Negative.  Fact Sheet for Patients:  https://www.fda.gov/media/152166/download  Fact Sheet for Healthcare Providers:  https://www.fda.gov/media/152162/download  This test is no t yet approved or cleared by the [place] FDA and  has been authorized for detection and/or diagnosis of SARS-CoV-2 by FDA under an Emergency Use Authorization (EUA). This EUA will remain  in effect (meaning this test can be used) for the duration of the COVID-19 declaration under Section 564(b)(1) of the Act, 21 U.S.C.section 360bbb-3(b)(1), unless the authorization is terminated  or revoked sooner.       Influenza A by PCR NEGATIVE NEGATIVE Final   Influenza B by PCR NEGATIVE NEGATIVE Final    Comment: (NOTE) The Xpert Xpress SARS-CoV-2/FLU/RSV plus assay is intended as an aid in the diagnosis of influenza from Nasopharyngeal swab specimens and should not be used as a sole basis for treatment. Nasal washings and aspirates are unacceptable for Xpert Xpress SARS-CoV-2/FLU/RSV testing.  Fact Sheet for Patients: https://www.fda.gov/media/152166/download  Fact Sheet for Healthcare Providers: https://www.fda.gov/media/152162/download  This test is not yet approved or cleared by the [place] FDA and has been authorized for detection and/or diagnosis of SARS-CoV-2 by FDA under  an Emergency Use Authorization (EUA). This EUA will remain in effect (meaning this test can be used) for the duration of the COVID-19 declaration under Section 564(b)(1) of the Act, 21 U.S.C. section 360bbb-3(b)(1), unless the authorization is terminated or revoked.  Performed at [facility], [address] [place], [place] [address]   Culture, blood (Routine X 2) w Reflex to ID Panel     Status: None (Preliminary result)   Collection Time: 04/25/21  7:26 AM   Specimen: BLOOD RIGHT ARM  Result Value Ref Range Status   Specimen Description BLOOD RIGHT ARM  Final   Special Requests   Final    BOTTLES DRAWN AEROBIC AND ANAEROBIC Blood Culture adequate volume   Culture   Final    NO GROWTH 2 DAYS Performed at [facility], [address] [place], [place] [address]    Report Status PENDING  Incomplete  Culture, blood (Routine X 2) w Reflex to ID Panel     Status: None (Preliminary result)   Collection Time: 04/25/21  7:32 AM   Specimen: BLOOD RIGHT ARM  Result Value Ref Range Status   Specimen Description BLOOD RIGHT ARM  Final   Special Requests   Final    BOTTLES DRAWN AEROBIC AND ANAEROBIC Blood Culture adequate volume   Culture   Final    NO GROWTH 2 DAYS Performed at [facility], [address] [place], [place] [address]    Report Status PENDING  Incomplete  MRSA Next Gen by PCR, Nasal     Status: None   Collection Time: 04/25/21  8:14 PM   Specimen: Nasal Mucosa; Nasal Swab  Result Value Ref Range Status   MRSA by PCR Next Gen NOT DETECTED NOT DETECTED Final    Comment: (NOTE) The GeneXpert MRSA Assay (FDA approved for NASAL specimens only), is one   component of a comprehensive MRSA colonization surveillance program. It is not intended to diagnose MRSA infection nor to guide or monitor treatment for MRSA infections. Test performance is not FDA approved in patients less than 67 years old. Performed at [facility], [address], [place], [place] [address]       Scheduled Meds:  atorvastatin  40 mg Oral Daily   Chlorhexidine Gluconate Cloth  6 each Topical Daily   diltiazem  240 mg Oral Daily   gabapentin  300 mg Oral TID   insulin aspart  0-5 Units Subcutaneous QHS   insulin aspart  0-9 Units Subcutaneous TID WC   loratadine  10 mg Oral Daily   metoprolol succinate  50 mg Oral Daily   multivitamin with minerals  1 tablet Oral Daily   pantoprazole  40 mg Oral Daily   tamsulosin  0.4 mg Oral Daily   vitamin B-12  500 mcg Oral Daily   Continuous Infusions:  sodium chloride 75 mL/hr at 04/27/21 0826   meropenem (MERREM) IV 1 g (04/27/21 1246)    Procedures/Studies: MR FOOT LEFT WO CONTRAST  Result Date: 04/24/2021 CLINICAL DATA:  Foot swelling.  Left great toe redness and swelling. EXAM: MRI OF THE LEFT FOOT WITHOUT CONTRAST TECHNIQUE: Multiplanar, multisequence MR imaging of the left foot was performed. No intravenous contrast was administered. COMPARISON:  None. FINDINGS: Bones/Joint/Cartilage Soft tissue wound along the medial aspect of the distal great toe. Bone marrow edema and bone destruction of the base of the first distal phalanx most concerning for osteomyelitis. Bone marrow edema in the first proximal phalanx which may be reactive versus secondary to osteomyelitis. Severe osteoarthritis of the first MTP joint with subchondral reactive marrow edema. Normal alignment. No joint effusion. Ligaments Collateral ligaments are intact.  Lisfranc ligament is intact. Muscles and Tendons Flexor, peroneal and extensor compartment tendons are intact. Generalized muscle atrophy. Soft tissue No fluid collection or hematoma. 6 mm T2 hyperintense soft tissue nodule along the plantar aspect of the foot at the level of the metatarsal neck between the first and second metatarsals which may reflect a small neuroma or vascular malformation. IMPRESSION: 1. Soft tissue wound along the medial aspect of the distal great toe. Bone marrow edema and bone destruction of  the base of the first distal phalanx most concerning for osteomyelitis. Bone marrow edema in the first proximal phalanx which may be reactive versus secondary to osteomyelitis. 2. Severe osteoarthritis of the first MTP joint with subchondral reactive marrow edema. Electronically Signed   By: [name] M.D.   On: 04/24/2021 15:04   US ARTERIAL ABI (SCREENING LOWER EXTREMITY)  Result Date: 04/27/2021 CLINICAL DATA:  Left great toe pain and redness for 2 years. Recent development of left foot osteomyelitis. EXAM: NONINVASIVE PHYSIOLOGIC VASCULAR STUDY OF BILATERAL LOWER EXTREMITIES TECHNIQUE: Evaluation of both lower extremities were performed at rest, including calculation of ankle-brachial indices with single level Doppler, pressure and pulse volume recording. COMPARISON:  None. FINDINGS: Right ABI:  0.99 Left ABI:  1.00 Right Lower Extremity: Monophasic waveforms seen at the posterior tibial and dorsalis pedis arteries. Left Lower Extremity: Monophasic waveform seen in the dorsalis pedis and posterior tibial arteries. 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 The essentially normal ABI values are likely artifact related to diffusely calcified noncompressible arteries. Further evaluation with CT angiography of the lower extremities would be beneficial to determine true degree of stenosis. Electronically Signed   By: Sharen Heck  Mir M.D.   On: 04/27/2021 11:13   DG CHEST PORT 1 VIEW  Result Date: 04/25/2021 CLINICAL DATA:  Sepsis, shortness of breath EXAM: PORTABLE CHEST 1 VIEW COMPARISON:  04/24/2021 FINDINGS: Stable cardiomediastinal contours. Atherosclerotic calcification of the aortic knob. Slightly increasing interstitial markings bilaterally. No lobar consolidation. No pleural effusion or pneumothorax. IMPRESSION: Slightly increasing interstitial markings bilaterally may reflect mild edema or  atypical/viral infection. Electronically Signed   By: Nicholas  Plundo D.O.   On: 04/25/2021 17:21   DG Chest Port 1 View  Result Date: 04/24/2021 CLINICAL DATA:  Left flank pain. Possible sepsis. History of diabetes, hypertension and atrial fibrillation. EXAM: PORTABLE CHEST 1 VIEW COMPARISON:  Radiographs 04/21/2021 and 07/03/2020. FINDINGS: 1137 hours. The heart size and mediastinal contours are stable with aortic atherosclerosis. The lungs appear clear. There is no pleural effusion or pneumothorax. No acute osseous findings are evident. There are degenerative changes in the spine associated with a mild scoliosis. Telemetry leads overlie the chest. IMPRESSION: Stable chest.  No acute cardiopulmonary process. Electronically Signed   By: William  Veazey M.D.   On: 04/24/2021 12:20   DG Foot Complete Left  Result Date: 04/24/2021 CLINICAL DATA:  Left flank pain. Left great toe erythema. Question osteomyelitis. EXAM: LEFT FOOT - COMPLETE 3+ VIEW COMPARISON:  Reports from left foot radiographs 03/20/2019 and 03/21/2019. 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 The distal 2nd phalanx appears eroded without acute cortical destruction. There is soft tissue swelling and soft tissue emphysema of the great toe. There is possible cortical destruction involving the medial base of the distal 1st phalanx. Moderate degenerative changes are present at the 1st metatarsophalangeal joint. IMPRESSION: 1. Prior studies are unavailable for direct comparison. 2. Soft tissue swelling and emphysema within the great toe consistent with soft tissue infection. Mild irregularity of the medial base of the distal phalanx could reflect early osteomyelitis. Consider further evaluation with MRI. 3. 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 Electronically Signed   By: William  Veazey M.D.   On: 04/24/2021 12:24   ECHOCARDIOGRAM COMPLETE  Result Date: 04/26/2021    ECHOCARDIOGRAM REPORT   Patient Name:   Ashlee C Ramnath Date of Exam: 04/26/2021 Medical Rec #:  [id number]     Height:       72.0 in Accession #:    [id number]    Weight:       226.2 lb Date of Birth:  12/30/1945    BSA:          2.245 m Patient Age:    74 years      BP:           112/47 mmHg Patient Gender: M             HR:           114 bpm. Exam Location:   Procedure: 2D Echo, Cardiac Doppler and Color Doppler Indications:    Bacteremia, Atrial fibrillation  History:        Patient has no prior history of Echocardiogram examinations.                 COPD, Arrythmias:Atrial Fibrillation, Signs/Symptoms:Bacteremia                 and Sepsis, confusion; Risk Factors:Hypertension, Diabetes and                   Dyslipidemia.  Sonographer:    Brooke Strickland RDCS Referring Phys: [id number] DAVID TAT  Sonographer Comments: Patient is morbidly obese and Technically difficult study due to poor echo windows. IMPRESSIONS  1. Left ventricular ejection fraction, by estimation, is 60 to 65%. The left ventricle has normal function. The left ventricle has no regional wall motion abnormalities. Left ventricular diastolic function could not be evaluated.  2. Right ventricular systolic function is normal. The right ventricular size is normal.  3. The mitral valve is normal in structure. No evidence of mitral valve regurgitation. No evidence of mitral stenosis.  4. The aortic valve has an indeterminant number of cusps. Aortic valve regurgitation is not visualized. Mild aortic valve stenosis.  5. The inferior vena cava is dilated in size with >50% respiratory variability, suggesting right atrial pressure of 8 mmHg. FINDINGS  Left Ventricle: Left ventricular ejection fraction, by estimation, is 60 to 65%. The left ventricle has normal function.  The left ventricle has no regional wall motion abnormalities. The left ventricular internal cavity size was normal in size. There is  no left ventricular hypertrophy. Left ventricular diastolic function could not be evaluated due to atrial fibrillation. Left ventricular diastolic function could not be evaluated. Right Ventricle: The right ventricular size is normal. Left Atrium: Left atrial size was normal in size. Right Atrium: Right atrial size was normal in size. Pericardium: There is no evidence of pericardial effusion. Mitral Valve: The mitral valve is normal in structure. Mild mitral annular calcification. No evidence of mitral valve regurgitation. No evidence of mitral valve stenosis. Tricuspid Valve: The tricuspid valve is normal in structure. Tricuspid valve regurgitation is trivial. No evidence of tricuspid stenosis. Aortic Valve: The aortic valve has an indeterminant number of cusps. Aortic valve regurgitation is not visualized. Mild aortic stenosis is present. Aortic valve mean gradient measures 11.0 mmHg. Aortic valve peak gradient measures 15.4 mmHg. Aortic valve  area, by VTI measures 1.86 cm. Pulmonic Valve: The pulmonic valve was normal in structure. Pulmonic valve regurgitation is not visualized. No evidence of pulmonic stenosis. Aorta: The aortic root is normal in size and structure. Venous: The inferior vena cava is dilated in size with greater than 50% respiratory variability, suggesting right atrial pressure of 8 mmHg. IAS/Shunts: No atrial level shunt detected by color flow Doppler.  LEFT VENTRICLE PLAX 2D LVIDd:         4.40 cm  Diastology LVIDs:         3.00 cm  LV e' medial:    10.80 cm/s LV PW:         1.18 cm  LV E/e' medial:  13.4 LV IVS:        1.18 cm  LV e' lateral:   11.60 cm/s LVOT diam:     2.20 cm  LV E/e' lateral: 12.5 LV SV:         76 LV SV Index:   34 LVOT Area:     3.80 cm  RIGHT VENTRICLE RV Basal diam:  3.61 cm RV S prime:     11.00 cm/s TAPSE (M-mode): 1.1 cm LEFT ATRIUM            Index       RIGHT ATRIUM           Index LA diam:      3.90 cm 1.74 cm/m  RA Area:     15.10 cm LA Vol (A4C): 55.8 ml 24.86 ml/m RA Volume:   38.60 ml  17.19 ml/m  AORTIC   VALVE AV Area (Vmax):    2.23 cm AV Area (Vmean):   2.07 cm AV Area (VTI):     1.86 cm AV Vmax:           196.00 cm/s AV Vmean:          153.500 cm/s AV VTI:            0.408 m AV Peak Grad:      15.4 mmHg AV Mean Grad:      11.0 mmHg LVOT Vmax:         115.00 cm/s LVOT Vmean:        83.700 cm/s LVOT VTI:          0.199 m LVOT/AV VTI ratio: 0.49  AORTA Ao Root diam: 3.00 cm MITRAL VALVE MV Area (PHT): 4.89 cm     SHUNTS MV Decel Time: 155 msec     Systemic VTI:  0.20 m MV E velocity: 145.00 cm/s  Systemic Diam: 2.20 cm Kirk Ruths MD Electronically signed by Kirk Ruths MD Signature Date/Time: 04/26/2021/9:46:46 AM    Final    CT Renal Stone Study  Result Date: 04/24/2021 CLINICAL DATA:  Flank pain, kidney stones suspected; left flank pain EXAM: CT ABDOMEN AND PELVIS WITHOUT CONTRAST TECHNIQUE: Multidetector CT imaging of the abdomen and pelvis was performed following the standard protocol without IV contrast. COMPARISON:  None. FINDINGS: Lower chest: No acute abnormality. Hepatobiliary: No focal liver abnormality is seen. No gallstones, gallbladder wall thickening, or biliary dilatation. Pancreas: Unremarkable. Spleen: Unremarkable. Adrenals/Urinary Tract: Adrenals are unremarkable. No renal calculi or hydronephrosis. Ureters are normal in caliber. Bladder is unremarkable. Stomach/Bowel: Stomach is within normal limits. Bowel is normal in caliber. Normal appendix. Vascular/Lymphatic: Extensive atherosclerosis. No enlarged lymph nodes. Reproductive: Unremarkable. Other: No free fluid. No significant abnormality of the abdominal wall. Musculoskeletal: Degenerative changes of the included spine. IMPRESSION: No acute abnormality.  No urinary tract calculi or hydronephrosis. Extensive atherosclerosis. Electronically Signed    By: Macy Mis M.D.   On: 04/24/2021 11:32    Orson Eva, DO  [facility]  If 7PM-7AM, please contact night-coverage [email or web address] Password [email or web address] 04/27/2021, 2:51 PM   LOS: 3 days

## 2021-04-28 ENCOUNTER — Inpatient Hospital Stay (HOSPITAL_COMMUNITY): Payer: Medicare Other

## 2021-04-28 DIAGNOSIS — E11628 Type 2 diabetes mellitus with other skin complications: Secondary | ICD-10-CM

## 2021-04-28 DIAGNOSIS — L03119 Cellulitis of unspecified part of limb: Secondary | ICD-10-CM | POA: Diagnosis not present

## 2021-04-28 DIAGNOSIS — M86172 Other acute osteomyelitis, left ankle and foot: Secondary | ICD-10-CM | POA: Diagnosis not present

## 2021-04-28 DIAGNOSIS — R7881 Bacteremia: Secondary | ICD-10-CM | POA: Diagnosis not present

## 2021-04-28 DIAGNOSIS — B955 Unspecified streptococcus as the cause of diseases classified elsewhere: Secondary | ICD-10-CM

## 2021-04-28 DIAGNOSIS — I5031 Acute diastolic (congestive) heart failure: Secondary | ICD-10-CM

## 2021-04-28 DIAGNOSIS — K219 Gastro-esophageal reflux disease without esophagitis: Secondary | ICD-10-CM

## 2021-04-28 DIAGNOSIS — I4891 Unspecified atrial fibrillation: Secondary | ICD-10-CM

## 2021-04-28 DIAGNOSIS — L03032 Cellulitis of left toe: Secondary | ICD-10-CM | POA: Diagnosis not present

## 2021-04-28 DIAGNOSIS — L089 Local infection of the skin and subcutaneous tissue, unspecified: Secondary | ICD-10-CM

## 2021-04-28 DIAGNOSIS — I129 Hypertensive chronic kidney disease with stage 1 through stage 4 chronic kidney disease, or unspecified chronic kidney disease: Secondary | ICD-10-CM

## 2021-04-28 DIAGNOSIS — E785 Hyperlipidemia, unspecified: Secondary | ICD-10-CM

## 2021-04-28 DIAGNOSIS — E11621 Type 2 diabetes mellitus with foot ulcer: Secondary | ICD-10-CM

## 2021-04-28 DIAGNOSIS — N189 Chronic kidney disease, unspecified: Secondary | ICD-10-CM

## 2021-04-28 DIAGNOSIS — E1122 Type 2 diabetes mellitus with diabetic chronic kidney disease: Secondary | ICD-10-CM

## 2021-04-28 DIAGNOSIS — L97529 Non-pressure chronic ulcer of other part of left foot with unspecified severity: Secondary | ICD-10-CM

## 2021-04-28 LAB — GLUCOSE, CAPILLARY
Glucose-Capillary: 157 mg/dL — ABNORMAL HIGH (ref 70–99)
Glucose-Capillary: 176 mg/dL — ABNORMAL HIGH (ref 70–99)
Glucose-Capillary: 181 mg/dL — ABNORMAL HIGH (ref 70–99)
Glucose-Capillary: 249 mg/dL — ABNORMAL HIGH (ref 70–99)

## 2021-04-28 LAB — BASIC METABOLIC PANEL
Anion gap: 9 (ref 5–15)
BUN: 13 mg/dL (ref 8–23)
CO2: 25 mmol/L (ref 22–32)
Calcium: 8.4 mg/dL — ABNORMAL LOW (ref 8.9–10.3)
Chloride: 100 mmol/L (ref 98–111)
Creatinine, Ser: 1.17 mg/dL (ref 0.61–1.24)
GFR, Estimated: >60 mL/min (ref >60)
Glucose, Bld: 199 mg/dL — ABNORMAL HIGH (ref 70–99)
Potassium: 3.8 mmol/L (ref 3.5–5.1)
Sodium: 134 mmol/L — ABNORMAL LOW (ref 135–145)

## 2021-04-28 LAB — MAGNESIUM: Magnesium: 1.6 mg/dL — ABNORMAL LOW (ref 1.7–2.4)

## 2021-04-28 LAB — CBC WITH DIFFERENTIAL/PLATELET
Abs Immature Granulocytes: 0.06 10*3/uL (ref 0.00–0.07)
Basophils Absolute: 0 10*3/uL (ref 0.0–0.1)
Basophils Relative: 0 %
Eosinophils Absolute: 0.1 10*3/uL (ref 0.0–0.5)
Eosinophils Relative: 1 %
HCT: 30 % — ABNORMAL LOW (ref 39.0–52.0)
Hemoglobin: 10.5 g/dL — ABNORMAL LOW (ref 13.0–17.0)
Immature Granulocytes: 1 %
Lymphocytes Relative: 12 %
Lymphs Abs: 1.1 10*3/uL (ref 0.7–4.0)
MCH: 31.2 pg (ref 26.0–34.0)
MCHC: 35 g/dL (ref 30.0–36.0)
MCV: 89 fL (ref 80.0–100.0)
Monocytes Absolute: 0.6 10*3/uL (ref 0.1–1.0)
Monocytes Relative: 6 %
Neutro Abs: 7.4 10*3/uL (ref 1.7–7.7)
Neutrophils Relative %: 80 %
Platelets: 280 10*3/uL (ref 150–400)
RBC: 3.37 MIL/uL — ABNORMAL LOW (ref 4.22–5.81)
RDW: 12.8 % (ref 11.5–15.5)
WBC: 9.2 10*3/uL (ref 4.0–10.5)
nRBC: 0 % (ref 0.0–0.2)

## 2021-04-28 LAB — CBC
HCT: 32.6 % — ABNORMAL LOW (ref 39.0–52.0)
Hemoglobin: 11.3 g/dL — ABNORMAL LOW (ref 13.0–17.0)
MCH: 30.8 pg (ref 26.0–34.0)
MCHC: 34.7 g/dL (ref 30.0–36.0)
MCV: 88.8 fL (ref 80.0–100.0)
Platelets: 269 10*3/uL (ref 150–400)
RBC: 3.67 MIL/uL — ABNORMAL LOW (ref 4.22–5.81)
RDW: 12.7 % (ref 11.5–15.5)
WBC: 9.1 10*3/uL (ref 4.0–10.5)
nRBC: 0 % (ref 0.0–0.2)

## 2021-04-28 LAB — PHOSPHORUS: Phosphorus: 2.4 mg/dL — ABNORMAL LOW (ref 2.5–4.6)

## 2021-04-28 MED ORDER — SODIUM CHLORIDE 0.9 % IV SOLN
2.0000 g | INTRAVENOUS | Status: AC
  Administered 2021-04-28 – 2021-05-08 (×11): 2 g via INTRAVENOUS
  Filled 2021-04-28 (×11): qty 20

## 2021-04-28 MED ORDER — IPRATROPIUM BROMIDE 0.02 % IN SOLN
0.5000 mg | Freq: Four times a day (QID) | RESPIRATORY_TRACT | Status: DC
  Administered 2021-04-28 (×3): 0.5 mg via RESPIRATORY_TRACT
  Filled 2021-04-28 (×3): qty 2.5

## 2021-04-28 MED ORDER — IPRATROPIUM BROMIDE 0.02 % IN SOLN
0.5000 mg | Freq: Three times a day (TID) | RESPIRATORY_TRACT | Status: DC
  Administered 2021-04-29 (×3): 0.5 mg via RESPIRATORY_TRACT
  Filled 2021-04-28 (×5): qty 2.5

## 2021-04-28 MED ORDER — LEVALBUTEROL HCL 0.63 MG/3ML IN NEBU
0.6300 mg | INHALATION_SOLUTION | Freq: Four times a day (QID) | RESPIRATORY_TRACT | Status: DC
  Administered 2021-04-28 (×2): 0.63 mg via RESPIRATORY_TRACT
  Filled 2021-04-28 (×5): qty 3

## 2021-04-28 MED ORDER — FUROSEMIDE 10 MG/ML IJ SOLN
40.0000 mg | Freq: Once | INTRAMUSCULAR | Status: AC
  Administered 2021-04-28: 40 mg via INTRAVENOUS
  Filled 2021-04-28: qty 4

## 2021-04-28 MED ORDER — LEVALBUTEROL HCL 0.63 MG/3ML IN NEBU
0.6300 mg | INHALATION_SOLUTION | Freq: Three times a day (TID) | RESPIRATORY_TRACT | Status: DC
  Administered 2021-04-29 (×3): 0.63 mg via RESPIRATORY_TRACT
  Filled 2021-04-28 (×7): qty 3

## 2021-04-28 MED ORDER — GUAIFENESIN ER 600 MG PO TB12
1200.0000 mg | ORAL_TABLET | Freq: Two times a day (BID) | ORAL | Status: DC
  Administered 2021-04-28 – 2021-05-09 (×19): 1200 mg via ORAL
  Filled 2021-04-28 (×23): qty 2

## 2021-04-28 NOTE — Consult Note (Signed)
Hospital Consult    Reason for Consult:  Left great toe wound with abnormal ABI Referring Physician:  [facility], Dr. Tat MRN #:  [id number]  History of Present Illness: This is a 75 y.o. male with history diabetes, hypertension, hyperlipidemia, stage 3 CKD, COPD, GERD, atrial fibrillation who presents as a transfer from [facility] for evaluation of left great toe wound.  Was reportedly admitted to [facility] with a diabetic foot infection.  He has also reportedly been having multiple mechanical falls with gait instability.  He had noninvasive arterial studies at [facility].  Although this suggested normal ABIs he had monophasic waveforms at the ankles.  He states his left foot wound has been present for over 1 year.  States he has not sought any previous vascular care.  Denies tobacco abuse.  Past Medical History:  Diagnosis Date   Atrial fibrillation (HCC)    Cervical spinal stenosis    DDD (degenerative disc disease), cervical    DDD (degenerative disc disease), lumbar    Diabetes mellitus without complication (HCC)    Hyperlipidemia    Hypertension    Peripheral artery disease (HCC)     Past Surgical History:  Procedure Laterality Date   AMPUTATION TOE     KNEE SURGERY Left    NOSE SURGERY      Allergies  Allergen Reactions   Asa [Aspirin] Hives   Penicillins Hives    Prior to Admission medications   Medication Sig Start Date End Date Taking? Authorizing Provider  alendronate (FOSAMAX) 70 MG tablet TAKE 1 TABLET BY MOUTH ONCE A WEEK. TAKE WITH A FULL GLASS OF WATER ON AN EMPTY STOMACH 01/28/21  Yes Daphine Deutscher, [name], FNP  atorvastatin (LIPITOR) 40 MG tablet Take 1 tablet (40 mg total) by mouth daily. 01/30/21  Yes Martin, [name], FNP  benazepril (LOTENSIN) 5 MG tablet TAKE 1 TABLET BY MOUTH EVERY DAY 03/02/21  Yes Stacks, Broadus John, MD  cetirizine (ZYRTEC) 10 MG tablet Take 10 mg by mouth daily.  01/12/20  Yes [provider]  fenofibrate  (TRICOR) 145 MG tablet Take 1 tablet (145 mg total) by mouth daily. 01/28/21  Yes Daphine Deutscher, [name], FNP  gabapentin (NEURONTIN) 300 MG capsule Take 1 capsule (300 mg total) by mouth 4 (four) times daily. TAKE 1 CAPSULE BY MOUTH THREE TIMES DAILY FOR PAIN 01/28/21  Yes Daphine Deutscher, [name], FNP  insulin regular (HUMULIN R) 100 units/mL injection INJECT 10 UNITS INTO THE SKIN THREE TIMES DAILY BEFORE MEALS 01/28/21  Yes Daphine Deutscher, [name], FNP  metFORMIN (GLUCOPHAGE) 500 MG tablet Take 1 tablet (500 mg total) by mouth 2 (two) times daily. 01/28/21  Yes Martin, [name], FNP  metoprolol succinate (TOPROL-XL) 50 MG 24 hr tablet TAKE 1 TABLET BY MOUTH DAILY, TAKE WITH FOOD OR IMMEDIATELY FOLLOWING A MEAL 01/28/21  Yes Daphine Deutscher, [name], FNP  Multiple Vitamin (MULTIVITAMIN) tablet Take 1 tablet by mouth daily.   Yes [provider]  mupirocin ointment (BACTROBAN) 2 % Apply 1 application topically daily. Left Great Toe 01/10/20  Yes [provider]  omeprazole (PRILOSEC) 20 MG capsule Take 1 capsule (20 mg total) by mouth daily. 01/28/21  Yes Daphine Deutscher, [name], FNP  tamsulosin (FLOMAX) 0.4 MG CAPS capsule Take 1 capsule (0.4 mg total) by mouth daily. 01/28/21  Yes Daphine Deutscher, [name], FNP  triamcinolone cream (KENALOG) 0.5 % APPLY TO THE AFFECTED AREA(S) THREE TIMES DAILY 01/12/20  Yes [provider]  vitamin B-12 (CYANOCOBALAMIN) 500 MCG tablet Take 500 mcg by mouth daily.  Yes [provider]  XARELTO 20 MG TABS tablet Take 1 tablet (20 mg total) by mouth daily. 01/28/21  Yes Martin, [name], FNP  glucose blood (ACCU-CHEK AVIVA PLUS) test strip USE TO check blood glucose 4 times daily 05/02/20   [name], FNP  HYDROcodone-acetaminophen (NORCO/VICODIN) 5-325 MG tablet Take 1 tablet by mouth every 4 (four) hours as needed. Patient not taking: No sig reported 07/04/20   [provider]  Insulin Syringe-Needle U-100 (GLOBAL INJECT  EASE INSULIN SYR) 31G X 5/16" 0.3 ML MISC USE WITH INSULIN EVERY DAY Dx E11.40 01/07/21   Dettinger, Elige Radon, MD    Social History   Socioeconomic History   Marital status: Divorced    Spouse name: Not on file   Number of children: 2   Years of education: 11   Highest education level: 11th grade  Occupational History   Occupation: Disabled  Tobacco Use   Smoking status: Former    Packs/day: 1.00    Types: Cigarettes    Start date: 06/19/1966    Quit date: 06/20/2011    Years since quitting: 9.8   Smokeless tobacco: Never  Vaping Use   Vaping Use: Never used  Substance and Sexual Activity   Alcohol use: No    Comment: quit drinking in 1982   Drug use: Not Currently    Comment: marijuana and cocaine in the past, quit in 2012   Sexual activity: Not Currently  Other Topics Concern   Not on file  Social History Narrative   Not on file   Social Determinants of Health   Financial Resource Strain: Not on file  Food Insecurity: Not on file  Transportation Needs: No Transportation Needs   Lack of Transportation (Medical): No   Lack of Transportation (Non-Medical): No  Physical Activity: Inactive   Days of Exercise per Week: 0 days   Minutes of Exercise per Session: 0 min  Stress: Stress Concern Present   Feeling of Stress : Rather much  Social Connections: Socially Isolated   Frequency of Communication with Friends and Family: Twice a week   Frequency of Social Gatherings with Friends and Family: Once a week   Attends Religious Services: Never   Database administrator or Organizations: No   Attends Banker Meetings: Never   Marital Status: Widowed  Catering manager Violence: Not on file     Family History  Problem Relation Age of Onset   Stroke Mother    Heart disease Mother        CABG   Cancer Father     ROS: [x]  Positive   [ ]  Negative   [ ]  All sytems reviewed and are negative  Cardiovascular: []  chest pain/pressure []  palpitations []  SOB  lying flat []  DOE []  pain in legs while walking []  pain in legs at rest []  pain in legs at night []  non-healing ulcers []  hx of DVT []  swelling in legs  Pulmonary: []  productive cough []  asthma/wheezing []  home O2  Neurologic: []  weakness in []  arms []  legs []  numbness in []  arms []  legs []  hx of CVA []  mini stroke [] difficulty speaking or slurred speech []  temporary loss of vision in one eye []  dizziness  Hematologic: []  hx of cancer []  bleeding problems []  problems with blood clotting easily  Endocrine:   []  diabetes []  thyroid disease  GI []  vomiting blood []  blood in stool  GU: []  CKD/renal failure []  HD--[]  M/W/F or []  T/T/S []  burning with  urination []  blood in urine  Psychiatric: []  anxiety []  depression  Musculoskeletal: []  arthritis []  joint pain  Integumentary: []  rashes []  ulcers  Constitutional: []  fever []  chills   Physical Examination  Vitals:   04/27/21 2000 04/27/21 2135  BP:  (!) 158/93  Pulse: 99 98  Resp: (!) 27 18  Temp:  98.3 F (36.8 C)  SpO2: 92% 91%   Body mass index is 30.68 kg/m.  General:  NAD Gait: Not observed HENT: WNL, normocephalic Pulmonary: normal non-labored breathing, without Rales, rhonchi,  wheezing Abdomen:  soft, NT/ND Vascular Exam/Pulses: Right femoral pulse 1+ Weak thready left femoral pulse No palpable pedal pulses Left toe wound pictured below Musculoskeletal: no muscle wasting or atrophy  Neurologic: A&O X 3; Appropriate Affect ; SENSATION: normal; MOTOR FUNCTION:  moving all extremities equally. Speech is fluent/normal      CBC    Component Value Date/Time   WBC 9.3 04/27/2021 0403   RBC 3.51 (L) 04/27/2021 0403   HGB 10.9 (L) 04/27/2021 0403   HGB 13.5 01/28/2021 1142   HCT 32.9 (L) 04/27/2021 0403   HCT 40.3 01/28/2021 1142   PLT 208 04/27/2021 0403   PLT 185 01/28/2021 1142   MCV 93.7 04/27/2021 0403   MCV 90 01/28/2021 1142   MCH 31.1 04/27/2021 0403   MCHC 33.1  04/27/2021 0403   RDW 13.1 04/27/2021 0403   RDW 12.7 01/28/2021 1142   LYMPHSABS 1.2 04/24/2021 1041   LYMPHSABS 2.6 01/28/2021 1142   MONOABS 1.0 04/24/2021 1041   EOSABS 0.0 04/24/2021 1041   EOSABS 0.1 01/28/2021 1142   BASOSABS 0.0 04/24/2021 1041   BASOSABS 0.0 01/28/2021 1142    BMET    Component Value Date/Time   NA 131 (L) 04/27/2021 0403   NA 137 01/28/2021 1142   K 3.8 04/27/2021 0403   CL 100 04/27/2021 0403   CO2 23 04/27/2021 0403   GLUCOSE 169 (H) 04/27/2021 0403   BUN 19 04/27/2021 0403   BUN 30 (H) 01/28/2021 1142   CREATININE 1.31 (H) 04/27/2021 0403   CALCIUM 7.7 (L) 04/27/2021 0403   GFRNONAA 57 (L) 04/27/2021 0403   GFRAA 44 (L) 10/17/2020 1434    COAGS: Lab Results  Component Value Date   INR 1.4 (H) 04/24/2021     Non-Invasive Vascular Imaging:    ABI yesterday 0.99 on the right and 1.0 on the left with monophasic waveforms at the ankles   ASSESSMENT/PLAN: This is a 75 y.o. male with multiple medical comorbidities presents with left great toe wound in the setting of diabetic foot infection.  Discussed with patient that he would benefit from left lower extremity arteriogram with possible intervention through a transfemoral approach.  He has monophasic waveforms at the ankle and certainly would not have enough inflow to heal a toe amputation at this pint.  We will get him scheduled later this week as there is no availability on the Cath Lab schedule today or tomorrow.  Discussed that if he has no endovascular options may ultimately require open surgical bypass.  I would continue broad-spectrum IV antibiotics.  Vascular will follow.  04/29/2021, MD [facility] Office: [phone]  04/29/2021

## 2021-04-28 NOTE — Progress Notes (Signed)
PROGRESS NOTE    Todd Zhang  ZTI:[id number] DOB: 05-12-1946 DOA: 04/24/2021 PCP: [name], FNP   Brief Narrative:  The patient is a 75 year old obese Caucasian male with a past medical history significant for but not limited to diabetes mellitus type 2, hypertension, hyperlipidemia, COPD, anxiety, GERD as well as other comorbidities who presented with left flank pain for 1 day duration a 2-week history of left foot pain.  Unfortunately patient is a difficult historian and history was primarily obtained from the medical record and according to the patient he has had 3 or 4 mechanical falls in the last week or so given his gait instability.  He is at several visits to the ED and on to [facility] on 04/21/2021 after he was found to be driving erratically on the road.  He is somewhat confused and in the ED at that time he was mildly tachycardic and he was given normal saline with improvement in his heart rate.  After the patient was fluid resuscitated he was clinically improved and discharged home in stable condition.  Subsequently he is called EMS because of left foot pain and left flank pain.  He states that he tried calling EMS on 04/23/2021 but had a mechanical fall resulting in taking his cell phone.  Is unclear how EMS was activated on the day admission but he ate noticed increasing pain and erythema of his left foot as well as edema for last 2 weeks.  He was evaluated for his left flank pain and underwent a CT renal stone study which showed no acute findings or hydronephrosis.  X-ray of the left foot showed soft tissue swelling and emphysema with great toe with mild irregularity of the medial base of the distal phalanx.  He had a prior left third toe amputation.  He was initiated on meropenem and fluid boluses given and he was admitted for sepsis secondary to diabetic foot ulcer and infection and also was found to have a group B strep bacteremia.  He subsequently transferred to [facility]  for further evaluation management.  On 04/25/2021 he developed atrial fibrillation with RVR and admitted to ICU and started on diltiazem drip.   Assessment & Plan:   Active Problems:   Diabetic foot infection ([place])   Sepsis due to undetermined organism [facility])   Atrial fibrillation with RVR (HCC)   CKD (chronic kidney disease) stage 3, GFR 30-59 ml/min (HCC)   Cellulitis and abscess of foot   Acute osteomyelitis of toe of left foot ([place])   Uncontrolled type 2 diabetes mellitus with hyperglycemia, without long-term current use of insulin (HCC)   Sepsis due to group B Streptococcus (HCC)   Cellulitis of great toe, left  Severe sepsis secondary to diabetic foot infection/Osteomyelitis and group B strep bacteremia -Present on admission  -Presented with leukocytosis, tachycardia and tachypnea -He has a diabetic foot infection and bacteremia -He is given 2 L of lactated Ringer's in the ED and then subsequently started on maintenance IV fluids with normal saline but will now Stop given CXR findings -Lactic acid peaked at 2.2 -WBC Went from 11.3 -> 9.3 -He was empirically started on IV vancomycin and IV meropenem and he continues to have fevers but this was trending down now; subsequently vancomycin was discontinued -Meropenem was continued until ID saw the patient given his group B strep bacteremia and they have narrowed his antibiotics to IV ceftriaxone 2 g IV daily given his findings of osteomyelitis and ABI results is unclear if he  would heal properly from a fracture amputation has been he is going to undergo a CT angiogram ID has been formally consulted and recommending continuing ceftriaxone and then obtaining a TEE which will be done on Friday to rule out endocarditis help determine the length of antibiotic therapy -He is to undergo a left lower extremity arteriogram with possible intervention through a transfemoral approach on Thursday and vascular recommends continuing broad-spectrum IV  antibiotics  Diabetic foot infection/Acute osteomyelitis L-great toe -X-ray left foot as discussed above -MRI left foot--bone marrow edema of  LEFT1st distal and proximal phalanges -General surgery consult appreciated and Dr. Arnoldo Morale evaluated at [facility] -The patient will at least need amputation of his left great toe -ESR-113 -CRP--32.4 -04/27/21 ABI--monophasic waveforms in posterior tibial and doralis pedis suggestin PAD -04/27/21--discussed with VVS, Dr. Jerold Coombe to see in consult after transfer to [facility] and patient to under go LLE Arteriogram with possible Intervention through Transfemoral Approach  Dyspena and SOB in the setting of Acute Diastolic CHF  -States it is chronic but appears significantly volume overloaded -Check CXR -Add Xopenex and Atrovent  -BNP was 255 and will repeat in the AM  -Guaifenesin, Flutter Valve, and Incentive Spirometry -ECHO showed EF of 60-65% but patient was in A Fib -Stop Fluid and Start Diuresis -Strict I's and O's and Daily Weights -Patient is +11.434 Liters so will Start IV Lasix 40 mg  -CXR this AM showed "Evidence of CHF with worsening pulmonary edema and right basilar atelectasis/consolidation."   Group B Streptococcus Bacteremia -source is diabetic foot infection -repeat blood cultures--neg -Echo--no vegetation; EF 60-65%, no WMA -Abx as above will get TEEE Friday    Atrial fibrillation with RVR -Suspect increased rate secondary to sepsis as well and has rebound tachycardia from not getting metoprolol -Holding rivaroxaban in anticipation of surgery -04/25/21--HR back up with soft BPs -8/13--transferred to SDU and started on diltiazem drip --Echo--no vegetation; EF 60-65%, no WMA -04/26/21--transitioned to po Dilitazem 240 mg po Daily and on Metoprolol Succinate 100 mg po Daily   Diabetes mellitus type 2 uncontrolled with hyperglycemia -8/12 hemoglobin A1c--8.3 -C/w NovoLog Sensitive Novolog sliding scale -Holding  Metformin  Normocytic Anemia -Patient's Hgb/Hct went from 10.9/32.8 -> 10.9/32.9 -Check Anemia Panel in the AM  -Continue to Monitor for S/Sx of Bleeding; no Overt Bleeding noted -Repeat CBC in the AM    Essential Hypertension -Restarted metoprolol succinate -Holding benazepril -C/w Diltiazem 240 mg po Daily and Metoprolol -Continue to Monitor BP per Protocol    AKI on CKD 3a -Patient's BUN/Cr went from 21/1.55 -> 19/1.31 and repeat is pending today  -Discontinue NS at 75 mL/hr -Continue to Hold Benazepril  -Avoid Nephrotoxic Medications, Contrast Dyes, and Hypotension -Repeat CMP in the AM   GERD -C/w Pantoprazole 40 mg po Daily    Hyperlipidemia -Continue Atorvastatin 40 mg po Daily and Fenofibrate   Left Flank Pain -CT renal stone protocol negative for acute findings -Suspect this is musculoskeletal strain/sprain from the patient's frequent falls -Judicious opioids   Hyponatremia -Secondary to volume depletion and hyperglycemia -Na+ was 131 -Will discontinue Normal Saline at 75 mL/hr now -Repeat CMP in the AM    Obesity -Complicates overall prognosis -Estimated body mass index is 30.68 kg/m as calculated from the following:   Height as of this encounter: 6' (1.829 m).   Weight as of this encounter: 102.6 kg. -Weight Loss and Dietary Counseling given    DVT prophylaxis: Holding Anticoagulation for possible Surgical Intervention; Will consider starting Heparin gtt; SCDs  Code Status: FULL CODE  Family Communication: No family present at bedside  Disposition Plan: No family present at bedside   Status is: Inpatient  Remains inpatient appropriate because:Unsafe d/c plan, IV treatments appropriate due to intensity of illness or inability to take PO, and Inpatient level of care appropriate due to severity of illness  Dispo: The patient is from: Home              Anticipated d/c is to:  TBD              Patient currently is not medically stable to d/c.    Difficult to place patient No  Consultants:  General Surgery Vascular Surgery ID Cardiology for TEE  Procedures:  ECHOCARDIOGRAM IMPRESSIONS     1. Left ventricular ejection fraction, by estimation, is 60 to 65%. The  left ventricle has normal function. The left ventricle has no regional  wall motion abnormalities. Left ventricular diastolic function could not  be evaluated.   2. Right ventricular systolic function is normal. The right ventricular  size is normal.   3. The mitral valve is normal in structure. No evidence of mitral valve  regurgitation. No evidence of mitral stenosis.   4. The aortic valve has an indeterminant number of cusps. Aortic valve  regurgitation is not visualized. Mild aortic valve stenosis.   5. The inferior vena cava is dilated in size with >50% respiratory  variability, suggesting right atrial pressure of 8 mmHg.   FINDINGS   Left Ventricle: Left ventricular ejection fraction, by estimation, is 60  to 65%. The left ventricle has normal function. The left ventricle has no  regional wall motion abnormalities. The left ventricular internal cavity  size was normal in size. There is   no left ventricular hypertrophy. Left ventricular diastolic function  could not be evaluated due to atrial fibrillation. Left ventricular  diastolic function could not be evaluated.   Right Ventricle: The right ventricular size is normal.   Left Atrium: Left atrial size was normal in size.   Right Atrium: Right atrial size was normal in size.   Pericardium: There is no evidence of pericardial effusion.   Mitral Valve: The mitral valve is normal in structure. Mild mitral annular  calcification. No evidence of mitral valve regurgitation. No evidence of  mitral valve stenosis.   Tricuspid Valve: The tricuspid valve is normal in structure. Tricuspid  valve regurgitation is trivial. No evidence of tricuspid stenosis.   Aortic Valve: The aortic valve has an  indeterminant number of cusps.  Aortic valve regurgitation is not visualized. Mild aortic stenosis is  present. Aortic valve mean gradient measures 11.0 mmHg. Aortic valve peak  gradient measures 15.4 mmHg. Aortic valve   area, by VTI measures 1.86 cm.   Pulmonic Valve: The pulmonic valve was normal in structure. Pulmonic valve  regurgitation is not visualized. No evidence of pulmonic stenosis.   Aorta: The aortic root is normal in size and structure.   Venous: The inferior vena cava is dilated in size with greater than 50%  respiratory variability, suggesting right atrial pressure of 8 mmHg.   IAS/Shunts: No atrial level shunt detected by color flow Doppler.      LEFT VENTRICLE  PLAX 2D  LVIDd:         4.40 cm  Diastology  LVIDs:         3.00 cm  LV e' medial:    10.80 cm/s  LV PW:  1.18 cm  LV E/e' medial:  13.4  LV IVS:        1.18 cm  LV e' lateral:   11.60 cm/s  LVOT diam:     2.20 cm  LV E/e' lateral: 12.5  LV SV:         76  LV SV Index:   34  LVOT Area:     3.80 cm      RIGHT VENTRICLE  RV Basal diam:  3.61 cm  RV S prime:     11.00 cm/s  TAPSE (M-mode): 1.1 cm   LEFT ATRIUM           Index       RIGHT ATRIUM           Index  LA diam:      3.90 cm 1.74 cm/m  RA Area:     15.10 cm  LA Vol (A4C): 55.8 ml 24.86 ml/m RA Volume:   38.60 ml  17.19 ml/m   AORTIC VALVE  AV Area (Vmax):    2.23 cm  AV Area (Vmean):   2.07 cm  AV Area (VTI):     1.86 cm  AV Vmax:           196.00 cm/s  AV Vmean:          153.500 cm/s  AV VTI:            0.408 m  AV Peak Grad:      15.4 mmHg  AV Mean Grad:      11.0 mmHg  LVOT Vmax:         115.00 cm/s  LVOT Vmean:        83.700 cm/s  LVOT VTI:          0.199 m  LVOT/AV VTI ratio: 0.49     AORTA  Ao Root diam: 3.00 cm   MITRAL VALVE  MV Area (PHT): 4.89 cm     SHUNTS  MV Decel Time: 155 msec     Systemic VTI:  0.20 m  MV E velocity: 145.00 cm/s  Systemic Diam: 2.20 cm   Antimicrobials:  Anti-infectives (From  admission, onward)    Start     Dose/Rate Route Frequency Ordered Stop   04/28/21 1200  cefTRIAXone (ROCEPHIN) 2 g in sodium chloride 0.9 % 100 mL IVPB       Note to Pharmacy: Patient tolerated cephalexin in the past   2 g 200 mL/hr over 30 Minutes Intravenous Every 24 hours 04/28/21 0958     04/27/21 1200  meropenem (MERREM) 1 g in sodium chloride 0.9 % 100 mL IVPB  Status:  Discontinued        1 g 200 mL/hr over 30 Minutes Intravenous Every 8 hours 04/27/21 0845 04/28/21 0957   04/25/21 1500  vancomycin (VANCOREADY) IVPB 1250 mg/250 mL  Status:  Discontinued        1,250 mg 166.7 mL/hr over 90 Minutes Intravenous Every 24 hours 04/24/21 1348 04/27/21 0845   04/24/21 2000  meropenem (MERREM) 500 mg in sodium chloride 0.9 % 100 mL IVPB  Status:  Discontinued        500 mg 200 mL/hr over 30 Minutes Intravenous Every 8 hours 04/24/21 1337 04/27/21 0845   04/24/21 1430  vancomycin (VANCOREADY) IVPB 2000 mg/400 mL        2,000 mg 200 mL/hr over 120 Minutes Intravenous  Once 04/24/21 1345 04/24/21 1819   04/24/21 1115  ciprofloxacin (CIPRO) IVPB 400  mg        400 mg 200 mL/hr over 60 Minutes Intravenous  Once 04/24/21 1100 04/24/21 1340   04/24/21 1115  clindamycin (CLEOCIN) IVPB 600 mg        600 mg 100 mL/hr over 30 Minutes Intravenous  Once 04/24/21 1100 04/24/21 1226        Subjective: Seen and examined at bedside and he is looking uncomfortable and complaining of some dyspnea.  Feels "bad".  Also has some abdominal discomfort.  No chest pain.  States that his foot hurts.  No other concerns or complaints at this time.  Objective: Vitals:   04/27/21 2000 04/27/21 2135 04/28/21 0750 04/28/21 1414  BP:  (!) 158/93 (!) 169/80 (!) 146/61  Pulse: 99 98 91 84  Resp: (!) 27 18 (!) 22 18  Temp:  98.3 F (36.8 C) 99.5 F (37.5 C) 98.4 F (36.9 C)  TempSrc:  Oral Oral Oral  SpO2: 92% 91% 90% 92%  Weight:      Height:        Intake/Output Summary (Last 24 hours) at 04/28/2021  1611 Last data filed at 04/28/2021 1543 Gross per 24 hour  Intake 3064.34 ml  Output 950 ml  Net 2114.34 ml   Filed Weights   04/24/21 1042 04/24/21 2340 04/25/21 2002  Weight: 108.9 kg 100.1 kg 102.6 kg   Examination: Physical Exam:  Constitutional: WN/WD obese chronically ill-appearing Caucasian male currently in mild distress appears dyspneic and NAD and uncomfortable Eyes: Lids and conjunctivae normal, sclerae anicteric  ENMT: External Ears, Nose appear normal. Grossly normal hearing.  Neck: Appears normal, supple, no cervical masses, normal ROM, no appreciable thyromegaly; no JVD Respiratory: Diminished to auscultation bilaterally with coarse breath sounds and some crackles noted, no wheezing, rales, rhonchi or crackles.  Is a slightly increased respiratory effort and is wearing supplemental oxygen via nasal cannula Cardiovascular: Irregular rate and irregular, no murmurs / rubs / gallops. S1 and S2 auscultated. 1+ lower extremity edema Abdomen: Soft, non-tender, distended secondary body habitus. Bowel sounds positive.  GU: Deferred. Musculoskeletal: No clubbing / cyanosis of digits/nails. No joint deformity upper and lower extremities.  Skin: No rashes, lesions, ulcers on limited skin evaluation. No induration; Warm and dry.  Neurologic: CN 2-12 grossly intact with no focal deficits. Romberg sign and cerebellar reflexes not assessed.  Psychiatric: Normal judgment and insight. Alert and oriented x 3. Normal mood and appropriate affect.   Data Reviewed: I have personally reviewed following labs and imaging studies  CBC: Recent Labs  Lab 04/24/21 1041 04/25/21 0558 04/26/21 0701 04/27/21 0403  WBC 14.3* 12.6* 11.3* 9.3  NEUTROABS 12.0*  --   --   --   HGB 12.4* 10.4* 10.9* 10.9*  HCT 35.7* 31.5* 32.8* 32.9*  MCV 91.8 94.6 93.2 93.7  PLT 188 172 196 275   Basic Metabolic Panel: Recent Labs  Lab 04/24/21 1041 04/25/21 0558 04/26/21 0701 04/27/21 0403 04/28/21 0749   NA 127* 133* 131* 131*  --   K 4.4 4.3 3.8 3.8  --   CL 96* 104 103 100  --   CO2 _0 --   GLUCOSE 333* 181* 194* 169*  --   BUN 27* 24* 21 19  --   CREATININE 1.89* 1.79* 1.55* 1.31*  --   CALCIUM 8.7* 8.4* 7.9* 7.7*  --   MG  --   --  1.6* 1.8  --   PHOS  --   --   --   --  2.4*   GFR: Estimated Creatinine Clearance: 60.4 mL/min (A) (by C-G formula based on SCr of 1.31 mg/dL (H)). Liver Function Tests: Recent Labs  Lab 04/24/21 1041 04/26/21 0701  AST 40 36  ALT 33 29  ALKPHOS 65 80  BILITOT 1.1 0.8  PROT 6.9 6.4*  ALBUMIN 3.1* 2.5*   No results for input(s): LIPASE, AMYLASE in the last 168 hours. No results for input(s): AMMONIA in the last 168 hours. Coagulation Profile: Recent Labs  Lab 04/24/21 1041  INR 1.4*   Cardiac Enzymes: No results for input(s): CKTOTAL, CKMB, CKMBINDEX, TROPONINI in the last 168 hours. BNP (last 3 results) No results for input(s): PROBNP in the last 8760 hours. HbA1C: No results for input(s): HGBA1C in the last 72 hours. CBG: Recent Labs  Lab 04/27/21 1643 04/27/21 2211 04/28/21 0657 04/28/21 1149 04/28/21 1559  GLUCAP 188* 205* 157* 249* 176*   Lipid Profile: No results for input(s): CHOL, HDL, LDLCALC, TRIG, CHOLHDL, LDLDIRECT in the last 72 hours. Thyroid Function Tests: No results for input(s): TSH, T4TOTAL, FREET4, T3FREE, THYROIDAB in the last 72 hours. Anemia Panel: No results for input(s): VITAMINB12, FOLATE, FERRITIN, TIBC, IRON, RETICCTPCT in the last 72 hours. Sepsis Labs: Recent Labs  Lab 04/24/21 1041 04/24/21 1215 04/25/21 1653  LATICACIDVEN 1.3 2.2* 0.8    Recent Results (from the past 240 hour(s))  Blood culture (routine single)     Status: Abnormal   Collection Time: 04/24/21 10:42 AM   Specimen: Right Antecubital; Blood  Result Value Ref Range Status   Specimen Description   Final    RIGHT ANTECUBITAL Performed at [facility], [address], [place], [place] [address]    Special  Requests   Final    BOTTLES DRAWN AEROBIC AND ANAEROBIC Blood Culture adequate volume Performed at [facility], [address], [place], [place] [address]    Culture  Setup Time   Final    GRAM POSITIVE COCCI Gram Stain Report Called to,Read Back By and Verified With: BRAME,M @ 2048 04/24/21 BY STEPHTR IN BOTH AEROBIC AND ANAEROBIC BOTTLES CRITICAL RESULT CALLED TO, READ BACK BY AND VERIFIED WITH: RN N.MOODY AT 1962 ON 04/25/2021 BY T.SAAD. Performed at [facility], [place] [address], [place], [place] [address]    Culture GROUP B STREP(S.AGALACTIAE)ISOLATED (A)  Final   Report Status 04/27/2021 FINAL  Final   Organism ID, Bacteria GROUP B STREP(S.AGALACTIAE)ISOLATED  Final      Susceptibility   Group b strep(s.agalactiae)isolated - MIC*    CLINDAMYCIN >=1 RESISTANT Resistant     AMPICILLIN <=0.25 SENSITIVE Sensitive     ERYTHROMYCIN >=8 RESISTANT Resistant     VANCOMYCIN 0.5 SENSITIVE Sensitive     CEFTRIAXONE <=0.12 SENSITIVE Sensitive     LEVOFLOXACIN 0.5 SENSITIVE Sensitive     PENICILLIN Value in next row Sensitive      SENSITIVE0.06    * GROUP B STREP(S.AGALACTIAE)ISOLATED  Blood Culture ID Panel (Reflexed)     Status: Abnormal   Collection Time: 04/24/21 10:42 AM  Result Value Ref Range Status   Enterococcus faecalis NOT DETECTED NOT DETECTED Final   Enterococcus Faecium NOT DETECTED NOT DETECTED Final   Listeria monocytogenes NOT DETECTED NOT DETECTED Final   Staphylococcus species NOT DETECTED NOT DETECTED Final   Staphylococcus aureus (BCID) NOT DETECTED NOT DETECTED Final   Staphylococcus epidermidis NOT DETECTED NOT DETECTED Final   Staphylococcus lugdunensis NOT DETECTED NOT DETECTED Final   Streptococcus species DETECTED (A) NOT DETECTED Final    Comment:  CRITICAL RESULT CALLED TO, READ BACK BY AND VERIFIED WITH: RN N.MOODY AT 0146 ON 04/25/2021 BY T.SAAD.    Streptococcus agalactiae DETECTED (A) NOT DETECTED Final    Comment: CRITICAL RESULT CALLED TO, READ  BACK BY AND VERIFIED WITH: RN N.MOODY AT 0146 ON 04/25/2021 BY T.SAAD.    Streptococcus pneumoniae NOT DETECTED NOT DETECTED Final   Streptococcus pyogenes NOT DETECTED NOT DETECTED Final   A.calcoaceticus-baumannii NOT DETECTED NOT DETECTED Final   Bacteroides fragilis NOT DETECTED NOT DETECTED Final   Enterobacterales NOT DETECTED NOT DETECTED Final   Enterobacter cloacae complex NOT DETECTED NOT DETECTED Final   Escherichia coli NOT DETECTED NOT DETECTED Final   Klebsiella aerogenes NOT DETECTED NOT DETECTED Final   Klebsiella oxytoca NOT DETECTED NOT DETECTED Final   Klebsiella pneumoniae NOT DETECTED NOT DETECTED Final   Proteus species NOT DETECTED NOT DETECTED Final   Salmonella species NOT DETECTED NOT DETECTED Final   Serratia marcescens NOT DETECTED NOT DETECTED Final   Haemophilus influenzae NOT DETECTED NOT DETECTED Final   Neisseria meningitidis NOT DETECTED NOT DETECTED Final   Pseudomonas aeruginosa NOT DETECTED NOT DETECTED Final   Stenotrophomonas maltophilia NOT DETECTED NOT DETECTED Final   Candida albicans NOT DETECTED NOT DETECTED Final   Candida auris NOT DETECTED NOT DETECTED Final   Candida glabrata NOT DETECTED NOT DETECTED Final   Candida krusei NOT DETECTED NOT DETECTED Final   Candida parapsilosis NOT DETECTED NOT DETECTED Final   Candida tropicalis NOT DETECTED NOT DETECTED Final   Cryptococcus neoformans/gattii NOT DETECTED NOT DETECTED Final    Comment: Performed at [facility], [address] [place], [place] [address]  Urine Culture     Status: Abnormal   Collection Time: 04/24/21 11:20 AM   Specimen: In/Out Cath Urine  Result Value Ref Range Status   Specimen Description   Final    IN/OUT CATH URINE Performed at [facility], [address] [place], [place] [address]    Special Requests   Final    NONE Performed at [facility], [address] [place], [place] [address]    Culture (A)  Final    10,000 COLONIES/mL GROUP B  STREP(S.AGALACTIAE)ISOLATED TESTING AGAINST S. AGALACTIAE NOT ROUTINELY PERFORMED DUE TO PREDICTABILITY OF AMP/PEN/VAN SUSCEPTIBILITY. Performed at [facility], [place] [address], [place], [place] [address]    Report Status 04/25/2021 FINAL  Final  Resp Panel by RT-PCR (Flu A&B, Covid) Nasopharyngeal Swab     Status: None   Collection Time: 04/24/21  1:15 PM   Specimen: Nasopharyngeal Swab; Nasopharyngeal(NP) swabs in vial transport medium  Result Value Ref Range Status   SARS Coronavirus 2 by RT PCR NEGATIVE NEGATIVE Final    Comment: (NOTE) SARS-CoV-2 target nucleic acids are NOT DETECTED.  The SARS-CoV-2 RNA is generally detectable in upper respiratory specimens during the acute phase of infection. The lowest concentration of SARS-CoV-2 viral copies this assay can detect is 138 copies/mL. A negative result does not preclude SARS-Cov-2 infection and should not be used as the sole basis for treatment or other patient management decisions. A negative result may occur with  improper specimen collection/handling, submission of specimen other than nasopharyngeal swab, presence of viral mutation(s) within the areas targeted by this assay, and inadequate number of viral copies(<138 copies/mL). A negative result must be combined with clinical observations, patient history, and epidemiological information. The expected result is Negative.  Fact Sheet for Patients:  EntrepreneurPulse.com.au  Fact Sheet for Healthcare Providers:  IncredibleEmployment.be  This  test is no t yet approved or cleared by the [place] and  has been authorized for detection and/or diagnosis of SARS-CoV-2 by FDA under an Emergency Use Authorization (EUA). This EUA will remain  in effect (meaning this test can be used) for the duration of the COVID-19 declaration under Section 564(b)(1) of the Act, 21 U.S.C.section 360bbb-3(b)(1), unless the authorization is  terminated  or revoked sooner.       Influenza A by PCR NEGATIVE NEGATIVE Final   Influenza B by PCR NEGATIVE NEGATIVE Final    Comment: (NOTE) The Xpert Xpress SARS-CoV-2/FLU/RSV plus assay is intended as an aid in the diagnosis of influenza from Nasopharyngeal swab specimens and should not be used as a sole basis for treatment. Nasal washings and aspirates are unacceptable for Xpert Xpress SARS-CoV-2/FLU/RSV testing.  Fact Sheet for Patients: EntrepreneurPulse.com.au  Fact Sheet for Healthcare Providers: IncredibleEmployment.be  This test is not yet approved or cleared by the [place] FDA and has been authorized for detection and/or diagnosis of SARS-CoV-2 by FDA under an Emergency Use Authorization (EUA). This EUA will remain in effect (meaning this test can be used) for the duration of the COVID-19 declaration under Section 564(b)(1) of the Act, 21 U.S.C. section 360bbb-3(b)(1), unless the authorization is terminated or revoked.  Performed at [facility], [address] [address], [place], [place] [address]   Culture, blood (Routine X 2) w Reflex to ID Panel     Status: None (Preliminary result)   Collection Time: 04/25/21  7:26 AM   Specimen: BLOOD RIGHT ARM  Result Value Ref Range Status   Specimen Description BLOOD RIGHT ARM  Final   Special Requests   Final    BOTTLES DRAWN AEROBIC AND ANAEROBIC Blood Culture adequate volume   Culture   Final    NO GROWTH 3 DAYS Performed at [facility], [address], [place], [place] [address]    Report Status PENDING  Incomplete  Culture, blood (Routine X 2) w Reflex to ID Panel     Status: None (Preliminary result)   Collection Time: 04/25/21  7:32 AM   Specimen: BLOOD RIGHT ARM  Result Value Ref Range Status   Specimen Description BLOOD RIGHT ARM  Final   Special Requests   Final    BOTTLES DRAWN AEROBIC AND ANAEROBIC Blood Culture adequate volume   Culture   Final    NO GROWTH 3  DAYS Performed at [facility], [address], [place], [place] [address]    Report Status PENDING  Incomplete  MRSA Next Gen by PCR, Nasal     Status: None   Collection Time: 04/25/21  8:14 PM   Specimen: Nasal Mucosa; Nasal Swab  Result Value Ref Range Status   MRSA by PCR Next Gen NOT DETECTED NOT DETECTED Final    Comment: (NOTE) The GeneXpert MRSA Assay (FDA approved for NASAL specimens only), is one component of a comprehensive MRSA colonization surveillance program. It is not intended to diagnose MRSA infection nor to guide or monitor treatment for MRSA infections. Test performance is not FDA approved in patients less than 31 years old. Performed at [facility], [address] [place], [place] [address]     RN Pressure Injury Documentation:     Estimated body mass index is 30.68 kg/m as calculated from the following:   Height as of this encounter: 6' (1.829 m).   Weight as of this encounter: 102.6 kg.  Malnutrition Type:   Malnutrition Characteristics:   Nutrition Interventions:  Radiology Studies: US ARTERIAL ABI (SCREENING LOWER EXTREMITY)  Result Date: 04/27/2021 CLINICAL DATA:  Left great toe pain and redness for 2 years. Recent development of left foot osteomyelitis. EXAM: NONINVASIVE PHYSIOLOGIC VASCULAR STUDY OF BILATERAL LOWER EXTREMITIES TECHNIQUE: Evaluation of both lower extremities were performed at rest, including calculation of ankle-brachial indices with single level Doppler, pressure and pulse volume recording. COMPARISON:  None. FINDINGS: Right ABI:  0.99 Left ABI:  1.00 Right Lower Extremity: Monophasic waveforms seen at the posterior tibial and dorsalis pedis arteries. Left Lower Extremity: Monophasic waveform seen in the dorsalis pedis and posterior tibial arteries. 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 The essentially normal ABI values are  likely artifact related to diffusely calcified noncompressible arteries. Further evaluation with CT angiography of the lower extremities would be beneficial to determine true degree of stenosis. Electronically Signed   By: Miachel Roux M.D.   On: 04/27/2021 11:13   DG CHEST PORT 1 VIEW  Result Date: 04/28/2021 CLINICAL DATA:  75 year old male with shortness of breath EXAM: PORTABLE CHEST 1 VIEW COMPARISON:  04/25/2021 FINDINGS: Cardiomediastinal silhouette unchanged in size and contour. No evidence of central vascular congestion. No interlobular septal thickening. Calcifications of the aortic arch again demonstrated. Interlobular septal thickening with a predominantly reticular pattern of opacity. Peribronchial thickening. Low lung volumes with patchy opacity in the right infrahilar region. No acute displaced fracture. Degenerative changes of the spine. IMPRESSION: Evidence of CHF with worsening pulmonary edema and right basilar atelectasis/consolidation. Electronically Signed   By: Corrie Mckusick D.O.   On: 04/28/2021 10:33    Scheduled Meds:  atorvastatin  40 mg Oral Daily   Chlorhexidine Gluconate Cloth  6 each Topical Daily   diltiazem  240 mg Oral Daily   gabapentin  300 mg Oral TID   guaiFENesin  1,200 mg Oral BID   insulin aspart  0-5 Units Subcutaneous QHS   insulin aspart  0-9 Units Subcutaneous TID WC   ipratropium  0.5 mg Nebulization Q6H   levalbuterol  0.63 mg Nebulization Q6H   loratadine  10 mg Oral Daily   metoprolol succinate  100 mg Oral Daily   multivitamin with minerals  1 tablet Oral Daily   pantoprazole  40 mg Oral Daily   tamsulosin  0.4 mg Oral Daily   vitamin B-12  500 mcg Oral Daily   Continuous Infusions:  sodium chloride 75 mL/hr at 04/28/21 1031   cefTRIAXone (ROCEPHIN)  IV 2 g (04/28/21 1033)    LOS: 4 days   Kerney Elbe, DO [facility] PAGER is on AMION  If 7PM-7AM, please contact night-coverage [email or web address]

## 2021-04-28 NOTE — Plan of Care (Signed)

## 2021-04-28 NOTE — Plan of Care (Addendum)
Ambulated pt to bathroom, pt becomes very winded and SOB when ambulating. Encouraged flutter valve and deep breathing. Pt continues to be confused and wanting to put shoes on to go home at times, able to reorient. L great toe wrapped in guaze and kerlix, area has minimal drainage with odor.   Problem: Health Behavior/Discharge Planning: Goal: Ability to manage health-related needs will improve Outcome: Not Progressing   Problem: Clinical Measurements: Goal: Will remain free from infection Outcome: Not Progressing   Problem: Activity: Goal: Risk for activity intolerance will decrease Outcome: Progressing

## 2021-04-28 NOTE — Care Management Important Message (Signed)
Important Message  Patient Details  Name: THORNE WIRZ MRN: [id number] Date of Birth: 02/17/1946   Medicare Important Message Given:  Yes     Dorena Bodo 04/28/2021, 4:23 PM

## 2021-04-28 NOTE — Progress Notes (Signed)
ID PROGRESS NOTE  Will do formal consult shortly, it appears patient doesn't remember his antibiotic allergy. In his records, he has tolerated cephalexin. For the meantime, we will d/c meropenem and switch to ceftriaxone to treat GBS bacteremia and DFU.  Duke Salvia Drue Second MD MPH [facility] [phone]

## 2021-04-28 NOTE — Consult Note (Signed)
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 On admit, his WBC 11K, and LA 2.2. he was started on broad abtx (vanco and meropenem) due to poor historian with abtx allergy.Infectious work up showed group b strep bacteremia. Imaging of his left foot showed soft tissue swelling and emphysema of the great toe. MRI showed soft tissue wound along the medial aspect of the distal great toe and bone destruction of the base of the first distal phalanx most concerning for osteomyelitis. Both vascular or orthopedics surgery is involved to guide next steps of management.   Past Medical History:  Diagnosis Date   Atrial fibrillation (HCC)    Cervical spinal stenosis    DDD (degenerative disc disease), cervical    DDD (degenerative disc disease), lumbar    Diabetes mellitus without complication (HCC)    Hyperlipidemia    Hypertension    Peripheral artery disease (HCC)     Allergies:  Allergies  Allergen Reactions   Asa [Aspirin] Hives   Penicillins Hives    MEDICATIONS:  atorvastatin  40 mg Oral Daily   Chlorhexidine Gluconate Cloth  6 each Topical Daily    diltiazem  240 mg Oral Daily   gabapentin  300 mg Oral TID   guaiFENesin  1,200 mg Oral BID   insulin aspart  0-5 Units Subcutaneous QHS   insulin aspart  0-9 Units Subcutaneous TID WC   ipratropium  0.5 mg Nebulization Q6H   levalbuterol  0.63 mg Nebulization Q6H   loratadine  10 mg Oral Daily   metoprolol succinate  100 mg Oral Daily   multivitamin with minerals  1 tablet Oral Daily   pantoprazole  40 mg Oral Daily   tamsulosin  0.4 mg Oral Daily   vitamin B-12  500 mcg Oral Daily    Social History   Tobacco Use   Smoking status: Former    Packs/day: 1.00    Types: Cigarettes    Start date: 06/19/1966    Quit date: 06/20/2011    Years since quitting: 9.8   Smokeless tobacco: Never  Vaping Use   Vaping Use: Never used  Substance Use Topics   Alcohol use: No    Comment: quit drinking in 1982   Drug use: Not Currently    Comment: marijuana and cocaine in the past, quit in 2012    Family History  Problem Relation Age of Onset   Stroke Mother    Heart disease Mother        CABG   Cancer Father  Review of Systems -   Constitutional: Negative for fever, chills, diaphoresis, activity change, appetite change, fatigue and unexpected weight change.  HENT: Negative for congestion, sore throat, rhinorrhea, sneezing, trouble swallowing and sinus pressure.  Eyes: Negative for photophobia and visual disturbance.  Respiratory: Negative for cough, chest tightness, shortness of breath, wheezing and stridor.  Cardiovascular: Negative for chest pain, palpitations and leg swelling.  Gastrointestinal: Negative for nausea, vomiting, abdominal pain, diarrhea, constipation, blood in stool, abdominal distention and anal bleeding.  Genitourinary: Negative for dysuria, hematuria, flank pain and difficulty urinating.  Musculoskeletal: Negative for myalgias, back pain, joint swelling, arthralgias and gait problem.  Skin: left foot wound Neurological: Negative for dizziness, tremors, weakness  and light-headedness.  Hematological: Negative for adenopathy. Does not bruise/bleed easily.  Psychiatric/Behavioral: Negative for behavioral problems, confusion, sleep disturbance, dysphoric mood, decreased concentration and agitation.     OBJECTIVE: Temp:  [98.3 F (36.8 C)-99.5 F (37.5 C)] 99.5 F (37.5 C) (08/16 0750) Pulse Rate:  [91-106] 91 (08/16 0750) Resp:  [18-27] 22 (08/16 0750) BP: (158-169)/(80-123) 169/80 (08/16 0750) SpO2:  [90 %-99 %] 90 % (08/16 0750) Physical Exam  Constitutional: He is oriented to person, place, and time. He appears well-developed and well-nourished. No distress.  HENT:  Mouth/Throat: Oropharynx is clear and moist. No oropharyngeal exudate.  Cardiovascular: Normal rate, regular rhythm and normal heart sounds. Exam reveals no gallop and no friction rub.  No murmur heard.  Pulmonary/Chest: Effort normal and breath sounds normal. No respiratory distress. He has no wheezes.  Abdominal: Soft. Bowel sounds are normal. He exhibits no distension. There is no tenderness.  Lymphadenopathy:  He has no cervical adenopathy.  Neurological: He is alert and oriented to person, place, and time. Decreased sensation to lower extremities Skin: Skin is warm and dry. No rash noted. No erythema. Blanching erythema to left great toe Ext: surgically removed 3rd toe bilaterally. Small ulcer to left edematous toe Psychiatric: He has a normal mood and affect. His behavior is normal.    LABS: Results for orders placed or performed during the hospital encounter of 04/24/21 (from the past 48 hour(s))  Glucose, capillary     Status: Abnormal   Collection Time: 04/26/21  5:03 PM  Result Value Ref Range   Glucose-Capillary 197 (H) 70 - 99 mg/dL    Comment: Glucose reference range applies only to samples taken after fasting for at least 8 hours.  Glucose, capillary     Status: Abnormal   Collection Time: 04/26/21 10:08 PM  Result Value Ref Range   Glucose-Capillary 196 (H)  70 - 99 mg/dL    Comment: Glucose reference range applies only to samples taken after fasting for at least 8 hours.   Comment 1 Notify RN    Comment 2 Document in Chart   CBC     Status: Abnormal   Collection Time: 04/27/21  4:03 AM  Result Value Ref Range   WBC 9.3 4.0 - 10.5 K/uL   RBC 3.51 (L) 4.22 - 5.81 MIL/uL   Hemoglobin 10.9 (L) 13.0 - 17.0 g/dL   HCT 69.4 (L) 85.4 - 62.7 %   MCV 93.7 80.0 - 100.0 fL   MCH 31.1 26.0 - 34.0 pg   MCHC 33.1 30.0 - 36.0 g/dL   RDW 03.5 00.9 - 38.1 %   Platelets 208 150 - 400 K/uL   nRBC 0.0 0.0 - 0.2 %    Comment: Performed at [facility], [address] [place], [place] [address]  Basic metabolic  panel     Status: Abnormal   Collection Time: 04/27/21  4:03 AM  Result Value Ref Range   Sodium 131 (L) 135 - 145 mmol/L   Potassium 3.8 3.5 - 5.1 mmol/L   Chloride 100 98 - 111 mmol/L   CO2 23 22 - 32 mmol/L   Glucose, Bld 169 (H) 70 - 99 mg/dL    Comment: Glucose reference range applies only to samples taken after fasting for at least 8 hours.   BUN 19 8 - 23 mg/dL   Creatinine, Ser 1.611.31 (H) 0.61 - 1.24 mg/dL   Calcium 7.7 (L) 8.9 - 10.3 mg/dL   GFR, Estimated 57 (L) >60 mL/min    Comment: (NOTE) Calculated using the CKD-EPI Creatinine Equation (2021)    Anion gap 8 5 - 15    Comment: Performed at [facility], [address] [place], [place] [address]  Magnesium     Status: None   Collection Time: 04/27/21  4:03 AM  Result Value Ref Range   Magnesium 1.8 1.7 - 2.4 mg/dL    Comment: Performed at [facility], [address] [place], [place] [address]  Glucose, capillary     Status: Abnormal   Collection Time: 04/27/21  7:43 AM  Result Value Ref Range   Glucose-Capillary 201 (H) 70 - 99 mg/dL    Comment: Glucose reference range applies only to samples taken after fasting for at least 8 hours.  Glucose, capillary     Status: Abnormal   Collection Time: 04/27/21 11:35 AM  Result Value Ref Range   Glucose-Capillary 239 (H) 70 - 99 mg/dL     Comment: Glucose reference range applies only to samples taken after fasting for at least 8 hours.  Glucose, capillary     Status: Abnormal   Collection Time: 04/27/21  4:43 PM  Result Value Ref Range   Glucose-Capillary 188 (H) 70 - 99 mg/dL    Comment: Glucose reference range applies only to samples taken after fasting for at least 8 hours.  Glucose, capillary     Status: Abnormal   Collection Time: 04/27/21 10:11 PM  Result Value Ref Range   Glucose-Capillary 205 (H) 70 - 99 mg/dL    Comment: Glucose reference range applies only to samples taken after fasting for at least 8 hours.  Glucose, capillary     Status: Abnormal   Collection Time: 04/28/21  6:57 AM  Result Value Ref Range   Glucose-Capillary 157 (H) 70 - 99 mg/dL    Comment: Glucose reference range applies only to samples taken after fasting for at least 8 hours.  Phosphorus     Status: Abnormal   Collection Time: 04/28/21  7:49 AM  Result Value Ref Range   Phosphorus 2.4 (L) 2.5 - 4.6 mg/dL    Comment: Performed at [facility], [address] [place], [place] [address]  Glucose, capillary     Status: Abnormal   Collection Time: 04/28/21 11:49 AM  Result Value Ref Range   Glucose-Capillary 249 (H) 70 - 99 mg/dL    Comment: Glucose reference range applies only to samples taken after fasting for at least 8 hours.    MICRO: 8/13 blood cx NGTd 8/12 blood cx Group B strep IMAGING: US ARTERIAL ABI (SCREENING LOWER EXTREMITY)  Result Date: 04/27/2021 CLINICAL DATA:  Left great toe pain and redness for 2 years. Recent development of left foot osteomyelitis. EXAM: NONINVASIVE PHYSIOLOGIC VASCULAR STUDY OF BILATERAL LOWER EXTREMITIES TECHNIQUE: Evaluation of both lower extremities  were performed at rest, including calculation of ankle-brachial indices with single level Doppler, pressure and pulse volume recording. COMPARISON:  None. FINDINGS: Right ABI:  0.99 Left ABI:  1.00 Right Lower Extremity: Monophasic waveforms  seen at the posterior tibial and dorsalis pedis arteries. Left Lower Extremity: Monophasic waveform seen in the dorsalis pedis and posterior tibial arteries. 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 The essentially normal ABI values are likely artifact related to diffusely calcified noncompressible arteries. Further evaluation with CT angiography of the lower extremities would be beneficial to determine true degree of stenosis. Electronically Signed   By: Acquanetta Belling M.D.   On: 04/27/2021 11:13   DG CHEST PORT 1 VIEW  Result Date: 04/28/2021 CLINICAL DATA:  75 year old male with shortness of breath EXAM: PORTABLE CHEST 1 VIEW COMPARISON:  04/25/2021 FINDINGS: Cardiomediastinal silhouette unchanged in size and contour. No evidence of central vascular congestion. No interlobular septal thickening. Calcifications of the aortic arch again demonstrated. Interlobular septal thickening with a predominantly reticular pattern of opacity. Peribronchial thickening. Low lung volumes with patchy opacity in the right infrahilar region. No acute displaced fracture. Degenerative changes of the spine. IMPRESSION: Evidence of CHF with worsening pulmonary edema and right basilar atelectasis/consolidation. Electronically Signed   By: Gilmer Mor D.O.   On: 04/28/2021 10:33    Assessment/Plan:  75yo M with T2DM, hx of left great toe DFU/osteomyelitis and secondary group b strep bacteremia  - appears patient has tolerated cephalexin in the past, we will plan to change him to ceftriaxone 2gm IV daily - given imagine findings of osteomyelitis and ABI results, unclear if he would heal appropriately from 1st ray amputation. Await CT angiogram results  Group B strep bacteremia = continue on ceftriaxone plus will need TEE to rule out endocarditis and help determine length of abtx therapy  Aki = appears fluids improving his  kidney function  Confusion= will check syphilis and hiv ab

## 2021-04-29 ENCOUNTER — Inpatient Hospital Stay (HOSPITAL_COMMUNITY): Payer: Medicare Other

## 2021-04-29 ENCOUNTER — Encounter (HOSPITAL_COMMUNITY): Payer: Self-pay | Admitting: Internal Medicine

## 2021-04-29 DIAGNOSIS — R7881 Bacteremia: Secondary | ICD-10-CM | POA: Diagnosis not present

## 2021-04-29 DIAGNOSIS — M86172 Other acute osteomyelitis, left ankle and foot: Secondary | ICD-10-CM | POA: Diagnosis not present

## 2021-04-29 DIAGNOSIS — E11628 Type 2 diabetes mellitus with other skin complications: Secondary | ICD-10-CM | POA: Diagnosis not present

## 2021-04-29 DIAGNOSIS — L089 Local infection of the skin and subcutaneous tissue, unspecified: Secondary | ICD-10-CM | POA: Diagnosis not present

## 2021-04-29 DIAGNOSIS — L03119 Cellulitis of unspecified part of limb: Secondary | ICD-10-CM | POA: Diagnosis not present

## 2021-04-29 DIAGNOSIS — I4891 Unspecified atrial fibrillation: Secondary | ICD-10-CM | POA: Diagnosis not present

## 2021-04-29 LAB — CBC WITH DIFFERENTIAL/PLATELET
Abs Immature Granulocytes: 0.09 10*3/uL — ABNORMAL HIGH (ref 0.00–0.07)
Basophils Absolute: 0 10*3/uL (ref 0.0–0.1)
Basophils Relative: 0 %
Eosinophils Absolute: 0 10*3/uL (ref 0.0–0.5)
Eosinophils Relative: 0 %
HCT: 31.4 % — ABNORMAL LOW (ref 39.0–52.0)
Hemoglobin: 10.9 g/dL — ABNORMAL LOW (ref 13.0–17.0)
Immature Granulocytes: 1 %
Lymphocytes Relative: 10 %
Lymphs Abs: 1.1 10*3/uL (ref 0.7–4.0)
MCH: 30.6 pg (ref 26.0–34.0)
MCHC: 34.7 g/dL (ref 30.0–36.0)
MCV: 88.2 fL (ref 80.0–100.0)
Monocytes Absolute: 0.7 10*3/uL (ref 0.1–1.0)
Monocytes Relative: 6 %
Neutro Abs: 9.1 10*3/uL — ABNORMAL HIGH (ref 1.7–7.7)
Neutrophils Relative %: 83 %
Platelets: 287 10*3/uL (ref 150–400)
RBC: 3.56 MIL/uL — ABNORMAL LOW (ref 4.22–5.81)
RDW: 12.9 % (ref 11.5–15.5)
WBC: 11 10*3/uL — ABNORMAL HIGH (ref 4.0–10.5)
nRBC: 0 % (ref 0.0–0.2)

## 2021-04-29 LAB — COMPREHENSIVE METABOLIC PANEL
ALT: 51 U/L — ABNORMAL HIGH (ref 0–44)
AST: 64 U/L — ABNORMAL HIGH (ref 15–41)
Albumin: 2.1 g/dL — ABNORMAL LOW (ref 3.5–5.0)
Alkaline Phosphatase: 91 U/L (ref 38–126)
Anion gap: 7 (ref 5–15)
BUN: 16 mg/dL (ref 8–23)
CO2: 24 mmol/L (ref 22–32)
Calcium: 8.3 mg/dL — ABNORMAL LOW (ref 8.9–10.3)
Chloride: 101 mmol/L (ref 98–111)
Creatinine, Ser: 1.28 mg/dL — ABNORMAL HIGH (ref 0.61–1.24)
GFR, Estimated: 58 mL/min — ABNORMAL LOW (ref >60)
Glucose, Bld: 193 mg/dL — ABNORMAL HIGH (ref 70–99)
Potassium: 3.6 mmol/L (ref 3.5–5.1)
Sodium: 132 mmol/L — ABNORMAL LOW (ref 135–145)
Total Bilirubin: 0.7 mg/dL (ref 0.3–1.2)
Total Protein: 5.9 g/dL — ABNORMAL LOW (ref 6.5–8.1)

## 2021-04-29 LAB — PHOSPHORUS: Phosphorus: 2.7 mg/dL (ref 2.5–4.6)

## 2021-04-29 LAB — MAGNESIUM: Magnesium: 1.6 mg/dL — ABNORMAL LOW (ref 1.7–2.4)

## 2021-04-29 LAB — GLUCOSE, CAPILLARY
Glucose-Capillary: 169 mg/dL — ABNORMAL HIGH (ref 70–99)
Glucose-Capillary: 191 mg/dL — ABNORMAL HIGH (ref 70–99)
Glucose-Capillary: 228 mg/dL — ABNORMAL HIGH (ref 70–99)
Glucose-Capillary: 140 mg/dL — ABNORMAL HIGH (ref 70–99)

## 2021-04-29 LAB — BRAIN NATRIURETIC PEPTIDE: B Natriuretic Peptide: 526.3 pg/mL — ABNORMAL HIGH (ref 0.0–100.0)

## 2021-04-29 MED ORDER — HEPARIN SODIUM (PORCINE) 5000 UNIT/ML IJ SOLN
5000.0000 [IU] | Freq: Three times a day (TID) | INTRAMUSCULAR | Status: AC
  Administered 2021-04-29 (×2): 5000 [IU] via SUBCUTANEOUS
  Filled 2021-04-29 (×2): qty 1

## 2021-04-29 MED ORDER — MAGNESIUM SULFATE 2 GM/50ML IV SOLN
2.0000 g | Freq: Once | INTRAVENOUS | Status: AC
  Administered 2021-04-29: 2 g via INTRAVENOUS
  Filled 2021-04-29: qty 50

## 2021-04-29 NOTE — Progress Notes (Signed)
Received a call from Sandra(sister) and updated her regarding pt's upcoming procedure. All questions and concerns were fully answered. Per Dois Davenport its ok for pt to sign his own consent.

## 2021-04-29 NOTE — H&P (View-Only) (Signed)
  Progress Note    04/29/2021 7:10 AM * No surgery date entered *  Diabetic foot infection with left great toe wound Schedule for Arteriogram tomorrow 8/17 with Dr. Hawken Reviewed risks/ benefits of procedure with patient and answered his questions NPO after midnight Morning labs Consent ordered   Corrina Baglia, PA-C [facility] [phone] 04/29/2021 7:10 AM  I have seen and evaluated the patient. I agree with the PA note as documented above.  Discussed plan for aortogram with left lower extremity arteriogram and possible intervention tomorrow with Dr. Hawken in the Cath Lab.  Will need to be n.p.o. after midnight.  Discussed if he has no endovascular options may ultimately require open surgical bypass but will need to evaluate with arteriogram first.  Discussed this is all to try and work toward limb salvage.  Adler Alton J. Daniell Mancinas, MD [facility] [place] Office: [phone]  

## 2021-04-29 NOTE — Progress Notes (Addendum)
Progress Note    Todd Zhang  ZOX:[id number] DOB: [birth year]  DOA: 04/24/2021 PCP: [name], FNP      Brief Narrative:    Medical records reviewed and are as summarized below:  Todd Zhang is a 75 y.o. male medical history of diabetes mellitus, hypertension, hyperlipidemia, COPD, anxiety, GERD, recent visit to [facility] on 04/21/2020 for confusion, who presented to the hospital with 1 day history of left flank pain and 2-week history of left foot pain.  He also complained of multiple falls secondary to unsteady gait.      Assessment/Plan:   Active Problems:   Diabetic foot infection (HCC)   Sepsis due to undetermined organism (HCC)   Atrial fibrillation with RVR (HCC)   CKD (chronic kidney disease) stage 3, GFR 30-59 ml/min (HCC)   Cellulitis and abscess of foot   Acute osteomyelitis of toe of left foot (HCC)   Uncontrolled type 2 diabetes mellitus with hyperglycemia, without long-term current use of insulin (HCC)   Sepsis due to group B Streptococcus (HCC)   Cellulitis of great toe, left    Body mass index is 30.68 kg/m.  (Obesity)   Severe sepsis secondary to acute osteomyelitis left great toe, diabetic foot infection, group B strep bacteremia: Continue empiric ceftriaxone. 2D echo did not show any evidence of vegetation EF was estimated at 60 to 65%.  Consulted cardiologist for TEE.  Plan for arteriogram tomorrow.  Follow-up with vascular surgeon and ID.  Atrial fibrillation with RVR: Xarelto has been held because of surgery tomorrow.  Continue metoprolol and Cardizem.  Type 2 diabetes mellitus with hyperglycemia: Hemoglobin A1c 8.3.  Continue NovoLog as needed for hyperglycemia.  Hyponatremia: Asymptomatic.  Monitor BMP.  Left flank pain: Improved.  CT renal stone was unremarkable for any acute abnormality.  Other comorbidities include CKD stage IIIb, hypertension, hyperlipidemia  Benazepril and metformin have been held.  Diet Order              Diet NPO time specified  Diet effective midnight           Diet Carb Modified Fluid consistency: Thin; Room service appropriate? Yes with Assist  Diet effective now                      Consultants: Infectious disease Vascular surgeon  Procedures: None    Medications:    atorvastatin  40 mg Oral Daily   Chlorhexidine Gluconate Cloth  6 each Topical Daily   diltiazem  240 mg Oral Daily   gabapentin  300 mg Oral TID   guaiFENesin  1,200 mg Oral BID   insulin aspart  0-5 Units Subcutaneous QHS   insulin aspart  0-9 Units Subcutaneous TID WC   ipratropium  0.5 mg Nebulization TID   levalbuterol  0.63 mg Nebulization TID   loratadine  10 mg Oral Daily   metoprolol succinate  100 mg Oral Daily   multivitamin with minerals  1 tablet Oral Daily   pantoprazole  40 mg Oral Daily   tamsulosin  0.4 mg Oral Daily   vitamin B-12  500 mcg Oral Daily   Continuous Infusions:  cefTRIAXone (ROCEPHIN)  IV 2 g (04/29/21 1244)     Anti-infectives (From admission, onward)    Start     Dose/Rate Route Frequency Ordered Stop   04/28/21 1200  cefTRIAXone (ROCEPHIN) 2 g in sodium chloride 0.9 % 100 mL IVPB       Note to  Pharmacy: Patient tolerated cephalexin in the past   2 g 200 mL/hr over 30 Minutes Intravenous Every 24 hours 04/28/21 0958     04/27/21 1200  meropenem (MERREM) 1 g in sodium chloride 0.9 % 100 mL IVPB  Status:  Discontinued        1 g 200 mL/hr over 30 Minutes Intravenous Every 8 hours 04/27/21 0845 04/28/21 0957   04/25/21 1500  vancomycin (VANCOREADY) IVPB 1250 mg/250 mL  Status:  Discontinued        1,250 mg 166.7 mL/hr over 90 Minutes Intravenous Every 24 hours 04/24/21 1348 04/27/21 0845   04/24/21 2000  meropenem (MERREM) 500 mg in sodium chloride 0.9 % 100 mL IVPB  Status:  Discontinued        500 mg 200 mL/hr over 30 Minutes Intravenous Every 8 hours 04/24/21 1337 04/27/21 0845   04/24/21 1430  vancomycin (VANCOREADY) IVPB 2000 mg/400 mL         2,000 mg 200 mL/hr over 120 Minutes Intravenous  Once 04/24/21 1345 04/24/21 1819   04/24/21 1115  ciprofloxacin (CIPRO) IVPB 400 mg        400 mg 200 mL/hr over 60 Minutes Intravenous  Once 04/24/21 1100 04/24/21 1340   04/24/21 1115  clindamycin (CLEOCIN) IVPB 600 mg        600 mg 100 mL/hr over 30 Minutes Intravenous  Once 04/24/21 1100 04/24/21 1226              Family Communication/Anticipated D/C date and plan/Code Status   DVT prophylaxis:      Code Status: Full Code  Family Communication: None Disposition Plan:    Status is: Inpatient  Remains inpatient appropriate because:IV treatments appropriate due to intensity of illness or inability to take PO and Inpatient level of care appropriate due to severity of illness  Dispo: The patient is from: Home              Anticipated d/c is to: Home              Patient currently is not medically stable to d/c.   Difficult to place patient No           Subjective:   C/o pain in the left foot.  Objective:    Vitals:   04/28/21 2038 04/28/21 2339 04/29/21 0700 04/29/21 0916  BP:  (!) 151/99 (!) 146/65   Pulse:  100 98   Resp:  18 19   Temp:  100 F (37.8 C) 98.9 F (37.2 C)   TempSrc:  Oral Oral   SpO2: 93% 91% 91% 95%  Weight:      Height:       No data found.   Intake/Output Summary (Last 24 hours) at 04/29/2021 1337 Last data filed at 04/29/2021 1300 Gross per 24 hour  Intake 1043.84 ml  Output 1850 ml  Net -806.16 ml   Filed Weights   04/24/21 1042 04/24/21 2340 04/25/21 2002  Weight: 108.9 kg 100.1 kg 102.6 kg    Exam:  GEN: NAD SKIN: Warm and dry EYES: EOMI ENT: MMM CV: Irregular rate and rhythm, tachycardic PULM: CTA B ABD: soft, obese, NT, +BS CNS: AAO x 3, non focal EXT: No edema.  Wound on the left big toe.  No drainage noted.        Data Reviewed:   I have personally reviewed following labs and imaging studies:  Labs: Labs show the following:   Basic  Metabolic Panel: Recent Labs  Lab 04/25/21 0558 04/26/21 0701 04/27/21 0403 04/28/21 0749 04/29/21 0630  NA 133* 131* 131* 134* 132*  K 4.3 3.8 3.8 3.8 3.6  CL 104 103 100 100 101  CO2 GLUCOSE 181* 194* 169* 199* 193*  BUN 24* CREATININE 1.79* 1.55* 1.31* 1.17 1.28*  CALCIUM 8.4* 7.9* 7.7* 8.4* 8.3*  MG  --  1.6* 1.8 1.6* 1.6*  PHOS  --   --   --  2.4* 2.7   GFR Estimated Creatinine Clearance: 61.8 mL/min (A) (by C-G formula based on SCr of 1.28 mg/dL (H)). Liver Function Tests: Recent Labs  Lab 04/24/21 1041 04/26/21 0701 04/29/21 0630  AST 40 36 64*  ALT 33 29 51*  ALKPHOS 65 80 91  BILITOT 1.1 0.8 0.7  PROT 6.9 6.4* 5.9*  ALBUMIN 3.1* 2.5* 2.1*   No results for input(s): LIPASE, AMYLASE in the last 168 hours. No results for input(s): AMMONIA in the last 168 hours. Coagulation profile Recent Labs  Lab 04/24/21 1041  INR 1.4*    CBC: Recent Labs  Lab 04/24/21 1041 04/25/21 0558 04/26/21 0701 04/27/21 0403 04/28/21 0749 04/28/21 1633 04/29/21 0630  WBC 14.3*   < > 11.3* 9.3 9.1 9.2 11.0*  NEUTROABS 12.0*  --   --   --   --  7.4 9.1*  HGB 12.4*   < > 10.9* 10.9* 11.3* 10.5* 10.9*  HCT 35.7*   < > 32.8* 32.9* 32.6* 30.0* 31.4*  MCV 91.8   < > 93.2 93.7 88.8 89.0 88.2  PLT 188   < > 196 208 269 280 287   < > = values in this interval not displayed.   Cardiac Enzymes: No results for input(s): CKTOTAL, CKMB, CKMBINDEX, TROPONINI in the last 168 hours. BNP (last 3 results) No results for input(s): PROBNP in the last 8760 hours. CBG: Recent Labs  Lab 04/28/21 1149 04/28/21 1559 04/28/21 2336 04/29/21 0745 04/29/21 1131  GLUCAP 249* 176* 181* 169* 191*   D-Dimer: No results for input(s): DDIMER in the last 72 hours. Hgb A1c: No results for input(s): HGBA1C in the last 72 hours. Lipid Profile: No results for input(s): CHOL, HDL, LDLCALC, TRIG, CHOLHDL, LDLDIRECT in the last 72 hours. Thyroid function studies: No  results for input(s): TSH, T4TOTAL, T3FREE, THYROIDAB in the last 72 hours.  Invalid input(s): FREET3 Anemia work up: No results for input(s): VITAMINB12, FOLATE, FERRITIN, TIBC, IRON, RETICCTPCT in the last 72 hours. Sepsis Labs: Recent Labs  Lab 04/24/21 1041 04/24/21 1215 04/25/21 0558 04/25/21 1653 04/26/21 0701 04/27/21 0403 04/28/21 0749 04/28/21 1633 04/29/21 0630  WBC 14.3*  --    < >  --    < > 9.3 9.1 9.2 11.0*  LATICACIDVEN 1.3 2.2*  --  0.8  --   --   --   --   --    < > = values in this interval not displayed.    Microbiology Recent Results (from the past 240 hour(s))  Blood culture (routine single)     Status: Abnormal   Collection Time: 04/24/21 10:42 AM   Specimen: Right Antecubital; Blood  Result Value Ref Range Status   Specimen Description   Final    RIGHT ANTECUBITAL Performed at [facility], [address], [place], [place] [address]    Special Requests   Final    BOTTLES DRAWN AEROBIC AND ANAEROBIC Blood Culture adequate volume Performed at [facility], [address]  [place], [place] [address]    Culture  Setup Time   Final    GRAM POSITIVE COCCI Gram Stain Report Called to,Read Back By and Verified With: BRAME,M @ 2048 04/24/21 BY STEPHTR IN BOTH AEROBIC AND ANAEROBIC BOTTLES CRITICAL RESULT CALLED TO, READ BACK BY AND VERIFIED WITH: RN N.MOODY AT 0146 ON 04/25/2021 BY T.SAAD. Performed at [facility], [address] [place], [place] [address]    Culture GROUP B STREP(S.AGALACTIAE)ISOLATED (A)  Final   Report Status 04/27/2021 FINAL  Final   Organism ID, Bacteria GROUP B STREP(S.AGALACTIAE)ISOLATED  Final      Susceptibility   Group b strep(s.agalactiae)isolated - MIC*    CLINDAMYCIN >=1 RESISTANT Resistant     AMPICILLIN <=0.25 SENSITIVE Sensitive     ERYTHROMYCIN >=8 RESISTANT Resistant     VANCOMYCIN 0.5 SENSITIVE Sensitive     CEFTRIAXONE <=0.12 SENSITIVE Sensitive     LEVOFLOXACIN 0.5 SENSITIVE Sensitive     PENICILLIN Value in  next row Sensitive      SENSITIVE0.06    * GROUP B STREP(S.AGALACTIAE)ISOLATED  Blood Culture ID Panel (Reflexed)     Status: Abnormal   Collection Time: 04/24/21 10:42 AM  Result Value Ref Range Status   Enterococcus faecalis NOT DETECTED NOT DETECTED Final   Enterococcus Faecium NOT DETECTED NOT DETECTED Final   Listeria monocytogenes NOT DETECTED NOT DETECTED Final   Staphylococcus species NOT DETECTED NOT DETECTED Final   Staphylococcus aureus (BCID) NOT DETECTED NOT DETECTED Final   Staphylococcus epidermidis NOT DETECTED NOT DETECTED Final   Staphylococcus lugdunensis NOT DETECTED NOT DETECTED Final   Streptococcus species DETECTED (A) NOT DETECTED Final    Comment: CRITICAL RESULT CALLED TO, READ BACK BY AND VERIFIED WITH: RN N.MOODY AT 0146 ON 04/25/2021 BY T.SAAD.    Streptococcus agalactiae DETECTED (A) NOT DETECTED Final    Comment: CRITICAL RESULT CALLED TO, READ BACK BY AND VERIFIED WITH: RN N.MOODY AT 0146 ON 04/25/2021 BY T.SAAD.    Streptococcus pneumoniae NOT DETECTED NOT DETECTED Final   Streptococcus pyogenes NOT DETECTED NOT DETECTED Final   A.calcoaceticus-baumannii NOT DETECTED NOT DETECTED Final   Bacteroides fragilis NOT DETECTED NOT DETECTED Final   Enterobacterales NOT DETECTED NOT DETECTED Final   Enterobacter cloacae complex NOT DETECTED NOT DETECTED Final   Escherichia coli NOT DETECTED NOT DETECTED Final   Klebsiella aerogenes NOT DETECTED NOT DETECTED Final   Klebsiella oxytoca NOT DETECTED NOT DETECTED Final   Klebsiella pneumoniae NOT DETECTED NOT DETECTED Final   Proteus species NOT DETECTED NOT DETECTED Final   Salmonella species NOT DETECTED NOT DETECTED Final   Serratia marcescens NOT DETECTED NOT DETECTED Final   Haemophilus influenzae NOT DETECTED NOT DETECTED Final   Neisseria meningitidis NOT DETECTED NOT DETECTED Final   Pseudomonas aeruginosa NOT DETECTED NOT DETECTED Final   Stenotrophomonas maltophilia NOT DETECTED NOT DETECTED Final    Candida albicans NOT DETECTED NOT DETECTED Final   Candida auris NOT DETECTED NOT DETECTED Final   Candida glabrata NOT DETECTED NOT DETECTED Final   Candida krusei NOT DETECTED NOT DETECTED Final   Candida parapsilosis NOT DETECTED NOT DETECTED Final   Candida tropicalis NOT DETECTED NOT DETECTED Final   Cryptococcus neoformans/gattii NOT DETECTED NOT DETECTED Final    Comment: Performed at [facility], [address], [place], [place] [address]  Urine Culture     Status: Abnormal   Collection Time: 04/24/21 11:20 AM   Specimen: In/Out Cath Urine  Result Value Ref Range Status  Specimen Description   Final    IN/OUT CATH URINE Performed at [facility], [address] [place], [place] [address]    Special Requests   Final    NONE Performed at [facility], [address] [place], [place] [address]    Culture (A)  Final    10,000 COLONIES/mL GROUP B STREP(S.AGALACTIAE)ISOLATED TESTING AGAINST S. AGALACTIAE NOT ROUTINELY PERFORMED DUE TO PREDICTABILITY OF AMP/PEN/VAN SUSCEPTIBILITY. Performed at [facility], [address] [place], [place] [address]    Report Status 04/25/2021 FINAL  Final  Resp Panel by RT-PCR (Flu A&B, Covid) Nasopharyngeal Swab     Status: None   Collection Time: 04/24/21  1:15 PM   Specimen: Nasopharyngeal Swab; Nasopharyngeal(NP) swabs in vial transport medium  Result Value Ref Range Status   SARS Coronavirus 2 by RT PCR NEGATIVE NEGATIVE Final    Comment: (NOTE) SARS-CoV-2 target nucleic acids are NOT DETECTED.  The SARS-CoV-2 RNA is generally detectable in upper respiratory specimens during the acute phase of infection. The lowest concentration of SARS-CoV-2 viral copies this assay can detect is 138 copies/mL. A negative result does not preclude SARS-Cov-2 infection and should not be used as the sole basis for treatment or other patient management decisions. A negative result may occur with  improper specimen collection/handling,  submission of specimen other than nasopharyngeal swab, presence of viral mutation(s) within the areas targeted by this assay, and inadequate number of viral copies(<138 copies/mL). A negative result must be combined with clinical observations, patient history, and epidemiological information. The expected result is Negative.  Fact Sheet for Patients:  BloggerCourse.comhttps://www.fda.gov/media/152166/download  Fact Sheet for Healthcare Providers:  SeriousBroker.ithttps://www.fda.gov/media/152162/download  This test is no t yet approved or cleared by the [place] FDA and  has been authorized for detection and/or diagnosis of SARS-CoV-2 by FDA under an Emergency Use Authorization (EUA). This EUA will remain  in effect (meaning this test can be used) for the duration of the COVID-19 declaration under Section 564(b)(1) of the Act, 21 U.S.C.section 360bbb-3(b)(1), unless the authorization is terminated  or revoked sooner.       Influenza A by PCR NEGATIVE NEGATIVE Final   Influenza B by PCR NEGATIVE NEGATIVE Final    Comment: (NOTE) The Xpert Xpress SARS-CoV-2/FLU/RSV plus assay is intended as an aid in the diagnosis of influenza from Nasopharyngeal swab specimens and should not be used as a sole basis for treatment. Nasal washings and aspirates are unacceptable for Xpert Xpress SARS-CoV-2/FLU/RSV testing.  Fact Sheet for Patients: BloggerCourse.comhttps://www.fda.gov/media/152166/download  Fact Sheet for Healthcare Providers: SeriousBroker.ithttps://www.fda.gov/media/152162/download  This test is not yet approved or cleared by the [place] FDA and has been authorized for detection and/or diagnosis of SARS-CoV-2 by FDA under an Emergency Use Authorization (EUA). This EUA will remain in effect (meaning this test can be used) for the duration of the COVID-19 declaration under Section 564(b)(1) of the Act, 21 U.S.C. section 360bbb-3(b)(1), unless the authorization is terminated or revoked.  Performed at [facility], [address] [place], [place] [address]   Culture, blood (Routine X 2) w Reflex to ID Panel     Status: None (Preliminary result)   Collection Time: 04/25/21  7:26 AM   Specimen: BLOOD RIGHT ARM  Result Value Ref Range Status   Specimen Description BLOOD RIGHT ARM  Final   Special Requests   Final    BOTTLES DRAWN AEROBIC AND ANAEROBIC Blood Culture adequate volume   Culture   Final    NO GROWTH 4 DAYS  Performed at [facility], [address] [place], [place] [address]    Report Status PENDING  Incomplete  Culture, blood (Routine X 2) w Reflex to ID Panel     Status: None (Preliminary result)   Collection Time: 04/25/21  7:32 AM   Specimen: BLOOD RIGHT ARM  Result Value Ref Range Status   Specimen Description BLOOD RIGHT ARM  Final   Special Requests   Final    BOTTLES DRAWN AEROBIC AND ANAEROBIC Blood Culture adequate volume   Culture   Final    NO GROWTH 4 DAYS Performed at [facility], [address], [place], [place] [address]    Report Status PENDING  Incomplete  MRSA Next Gen by PCR, Nasal     Status: None   Collection Time: 04/25/21  8:14 PM   Specimen: Nasal Mucosa; Nasal Swab  Result Value Ref Range Status   MRSA by PCR Next Gen NOT DETECTED NOT DETECTED Final    Comment: (NOTE) The GeneXpert MRSA Assay (FDA approved for NASAL specimens only), is one component of a comprehensive MRSA colonization surveillance program. It is not intended to diagnose MRSA infection nor to guide or monitor treatment for MRSA infections. Test performance is not FDA approved in patients less than 74 years old. Performed at [facility], [address] [place], [place] [address]     Procedures and diagnostic studies:  DG CHEST PORT 1 VIEW  Result Date: 04/29/2021 CLINICAL DATA:  Shortness of breath EXAM: PORTABLE CHEST 1 VIEW COMPARISON:  Chest radiograph 1 day prior FINDINGS: The cardiomediastinal silhouette is stable. There is vascular congestion and mild pulmonary interstitial edema. A small  left pleural effusion is new/increased in size from prior. Patchy opacities in the left base likely reflect subsegmental atelectasis. There is no right effusion. There is no pneumothorax. The bones are unremarkable. IMPRESSION: 1. New/increased small left pleural effusion with adjacent opacities likely reflecting subsegmental atelectasis. 2. Similar degree of pulmonary interstitial edema. Electronically Signed   By: Lesia Hausen M.D.   On: 04/29/2021 08:30   DG CHEST PORT 1 VIEW  Result Date: 04/28/2021 CLINICAL DATA:  75 year old male with shortness of breath EXAM: PORTABLE CHEST 1 VIEW COMPARISON:  04/25/2021 FINDINGS: Cardiomediastinal silhouette unchanged in size and contour. No evidence of central vascular congestion. No interlobular septal thickening. Calcifications of the aortic arch again demonstrated. Interlobular septal thickening with a predominantly reticular pattern of opacity. Peribronchial thickening. Low lung volumes with patchy opacity in the right infrahilar region. No acute displaced fracture. Degenerative changes of the spine. IMPRESSION: Evidence of CHF with worsening pulmonary edema and right basilar atelectasis/consolidation. Electronically Signed   By: Gilmer Mor D.O.   On: 04/28/2021 10:33               LOS: 5 days   Loana Salvaggio  [facility]   Pager on [email or web address]. If 7PM-7AM, please contact night-coverage at [email or web address]     04/29/2021, 1:37 PM

## 2021-04-29 NOTE — Progress Notes (Addendum)
  Progress Note    04/29/2021 7:10 AM * No surgery date entered *  Diabetic foot infection with left great toe wound Schedule for Arteriogram tomorrow 8/17 with Dr. Lenell Antu Reviewed risks/ benefits of procedure with patient and answered his questions NPO after midnight Morning labs Consent ordered   Dory Horn [facility] [phone] 04/29/2021 7:10 AM  I have seen and evaluated the patient. I agree with the PA note as documented above.  Discussed plan for aortogram with left lower extremity arteriogram and possible intervention tomorrow with Dr. Lenell Antu in the Cath Lab.  Will need to be n.p.o. after midnight.  Discussed if he has no endovascular options may ultimately require open surgical bypass but will need to evaluate with arteriogram first.  Discussed this is all to try and work toward limb salvage.  [name], MD [facility] [place] Office: [phone]

## 2021-04-29 NOTE — Plan of Care (Signed)
  Problem: Health Behavior/Discharge Planning: Goal: Ability to manage health-related needs will improve Outcome: Progressing   Problem: Clinical Measurements: Goal: Will remain free from infection Outcome: Progressing   Problem: Activity: Goal: Risk for activity intolerance will decrease Outcome: Progressing   Problem: Coping: Goal: Level of anxiety will decrease Outcome: Progressing   Problem: Safety: Goal: Ability to remain free from injury will improve Outcome: Progressing   

## 2021-04-29 NOTE — Progress Notes (Signed)
[facility]    Date of Admission:  04/24/2021   Total days of antibiotics 6/day 2 ctx           ID: Todd Zhang is a 75 y.o. male with left great toe osteomyelitis and cellulitis Active Problems:   Diabetic foot infection (HCC)   Sepsis due to undetermined organism (HCC)   Atrial fibrillation with RVR (HCC)   CKD (chronic kidney disease) stage 3, GFR 30-59 ml/min (HCC)   Cellulitis and abscess of foot   Acute osteomyelitis of toe of left foot (HCC)   Uncontrolled type 2 diabetes mellitus with hyperglycemia, without long-term current use of insulin (HCC)   Sepsis due to group B Streptococcus (HCC)   Cellulitis of great toe, left    Subjective: Afebrile. Tolerated ceftriaxone. Erythema to left great toe and dorsum of foot is improved. Less swelling. Still confused.   Medications:   atorvastatin  40 mg Oral Daily   Chlorhexidine Gluconate Cloth  6 each Topical Daily   diltiazem  240 mg Oral Daily   gabapentin  300 mg Oral TID   guaiFENesin  1,200 mg Oral BID   heparin injection (subcutaneous)  5,000 Units Subcutaneous Q8H   insulin aspart  0-5 Units Subcutaneous QHS   insulin aspart  0-9 Units Subcutaneous TID WC   ipratropium  0.5 mg Nebulization TID   levalbuterol  0.63 mg Nebulization TID   loratadine  10 mg Oral Daily   metoprolol succinate  100 mg Oral Daily   multivitamin with minerals  1 tablet Oral Daily   pantoprazole  40 mg Oral Daily   tamsulosin  0.4 mg Oral Daily   vitamin B-12  500 mcg Oral Daily    Objective: Vital signs in last 24 hours: Temp:  [98.7 F (37.1 C)-100 F (37.8 C)] 98.7 F (37.1 C) (08/17 1500) Pulse Rate:  [83-100] 83 (08/17 1532) Resp:  [17-20] 20 (08/17 1532) BP: (131-151)/(59-99) 131/59 (08/17 1500) SpO2:  [91 %-95 %] 95 % (08/17 1532) Physical Exam  Constitutional: He is oriented to person, place, and time. He appears well-developed and well-nourished. No distress.  HENT:  Mouth/Throat: Oropharynx  is clear and moist. No oropharyngeal exudate.  Cardiovascular: Normal rate, regular rhythm and normal heart sounds. Exam reveals no gallop and no friction rub.  No murmur heard.  Pulmonary/Chest: Effort normal and breath sounds normal. No respiratory distress. He has no wheezes.  Abdominal: Soft. Bowel sounds are normal. He exhibits no distension. There is no tenderness.  Ext: left great toe ongoing drainage medial aspect. Less blanching erythema Neurological: He is alert and oriented to person, place, and time.  Skin: Skin is warm and dry. No rash noted. No erythema.  Psychiatric: He has a normal mood and affect. His behavior is normal.    Lab Results Recent Labs    04/28/21 0749 04/28/21 1633 04/29/21 0630  WBC 9.1 9.2 11.0*  HGB 11.3* 10.5* 10.9*  HCT 32.6* 30.0* 31.4*  NA 134*  --  132*  K 3.8  --  3.6  CL 100  --  101  CO2 25  --  24  BUN 13  --  16  CREATININE 1.17  --  1.28*   Liver Panel Recent Labs    04/29/21 0630  PROT 5.9*  ALBUMIN 2.1*  AST 64*  ALT 51*  ALKPHOS 91  BILITOT 0.7   Sedimentation Rate No results for input(s): ESRSEDRATE in the last 72 hours. C-Reactive Protein  No results for input(s): CRP in the last 72 hours.  Microbiology: Reviewed 8/12 blood cx group b strep 8/13 blood cx NGTD Studies/Results: DG CHEST PORT 1 VIEW  Result Date: 04/29/2021 CLINICAL DATA:  Shortness of breath EXAM: PORTABLE CHEST 1 VIEW COMPARISON:  Chest radiograph 1 day prior FINDINGS: The cardiomediastinal silhouette is stable. There is vascular congestion and mild pulmonary interstitial edema. A small left pleural effusion is new/increased in size from prior. Patchy opacities in the left base likely reflect subsegmental atelectasis. There is no right effusion. There is no pneumothorax. The bones are unremarkable. IMPRESSION: 1. New/increased small left pleural effusion with adjacent opacities likely reflecting subsegmental atelectasis. 2. Similar degree of pulmonary  interstitial edema. Electronically Signed   By: Lesia Hausen M.D.   On: 04/29/2021 08:30   DG CHEST PORT 1 VIEW  Result Date: 04/28/2021 CLINICAL DATA:  75 year old male with shortness of breath EXAM: PORTABLE CHEST 1 VIEW COMPARISON:  04/25/2021 FINDINGS: Cardiomediastinal silhouette unchanged in size and contour. No evidence of central vascular congestion. No interlobular septal thickening. Calcifications of the aortic arch again demonstrated. Interlobular septal thickening with a predominantly reticular pattern of opacity. Peribronchial thickening. Low lung volumes with patchy opacity in the right infrahilar region. No acute displaced fracture. Degenerative changes of the spine. IMPRESSION: Evidence of CHF with worsening pulmonary edema and right basilar atelectasis/consolidation. Electronically Signed   By: Gilmer Mor D.O.   On: 04/28/2021 10:33     Assessment/Plan: Dfu/osteomyelitis with group b streptococcal bacteremia = continue with ceftriaxone 2gm IV daily.  Repeat blood cx are negative. Has upcoming TEE to evaluate for endocarditis. -tomorrow has ABI to evaluate for surgical intervention.   [facility] Cell: [phone] Pager: [phone]  04/29/2021, 3:40 PM

## 2021-04-30 ENCOUNTER — Ambulatory Visit: Payer: Self-pay | Admitting: Nurse Practitioner

## 2021-04-30 ENCOUNTER — Encounter (HOSPITAL_COMMUNITY)
Admission: EM | Disposition: A | Discharge status: Skilled Nursing Facility | Payer: Self-pay | Source: Home / Self Care | Attending: Internal Medicine

## 2021-04-30 ENCOUNTER — Encounter (HOSPITAL_COMMUNITY): Payer: Self-pay | Admitting: Vascular Surgery

## 2021-04-30 DIAGNOSIS — L03119 Cellulitis of unspecified part of limb: Secondary | ICD-10-CM | POA: Diagnosis not present

## 2021-04-30 DIAGNOSIS — M86172 Other acute osteomyelitis, left ankle and foot: Secondary | ICD-10-CM | POA: Diagnosis not present

## 2021-04-30 DIAGNOSIS — I70262 Atherosclerosis of native arteries of extremities with gangrene, left leg: Secondary | ICD-10-CM

## 2021-04-30 DIAGNOSIS — L02619 Cutaneous abscess of unspecified foot: Secondary | ICD-10-CM | POA: Diagnosis not present

## 2021-04-30 DIAGNOSIS — I4891 Unspecified atrial fibrillation: Secondary | ICD-10-CM | POA: Diagnosis not present

## 2021-04-30 HISTORY — PX: ABDOMINAL AORTOGRAM W/LOWER EXTREMITY: CATH118223

## 2021-04-30 LAB — CBC WITH DIFFERENTIAL/PLATELET
Abs Immature Granulocytes: 0.06 10*3/uL (ref 0.00–0.07)
Basophils Absolute: 0 10*3/uL (ref 0.0–0.1)
Basophils Relative: 0 %
Eosinophils Absolute: 0.2 10*3/uL (ref 0.0–0.5)
Eosinophils Relative: 2 %
HCT: 31.5 % — ABNORMAL LOW (ref 39.0–52.0)
Hemoglobin: 10.8 g/dL — ABNORMAL LOW (ref 13.0–17.0)
Immature Granulocytes: 1 %
Lymphocytes Relative: 17 %
Lymphs Abs: 1.7 10*3/uL (ref 0.7–4.0)
MCH: 30.5 pg (ref 26.0–34.0)
MCHC: 34.3 g/dL (ref 30.0–36.0)
MCV: 89 fL (ref 80.0–100.0)
Monocytes Absolute: 0.6 10*3/uL (ref 0.1–1.0)
Monocytes Relative: 6 %
Neutro Abs: 7.4 10*3/uL (ref 1.7–7.7)
Neutrophils Relative %: 74 %
Platelets: 308 10*3/uL (ref 150–400)
RBC: 3.54 MIL/uL — ABNORMAL LOW (ref 4.22–5.81)
RDW: 12.7 % (ref 11.5–15.5)
WBC: 10 10*3/uL (ref 4.0–10.5)
nRBC: 0 % (ref 0.0–0.2)

## 2021-04-30 LAB — CULTURE, BLOOD (ROUTINE X 2)
Culture: NO GROWTH
Culture: NO GROWTH
Special Requests: ADEQUATE
Special Requests: ADEQUATE

## 2021-04-30 LAB — COMPREHENSIVE METABOLIC PANEL
ALT: 68 U/L — ABNORMAL HIGH (ref 0–44)
AST: 89 U/L — ABNORMAL HIGH (ref 15–41)
Albumin: 2 g/dL — ABNORMAL LOW (ref 3.5–5.0)
Alkaline Phosphatase: 78 U/L (ref 38–126)
Anion gap: 9 (ref 5–15)
BUN: 15 mg/dL (ref 8–23)
CO2: 25 mmol/L (ref 22–32)
Calcium: 8.6 mg/dL — ABNORMAL LOW (ref 8.9–10.3)
Chloride: 100 mmol/L (ref 98–111)
Creatinine, Ser: 1.3 mg/dL — ABNORMAL HIGH (ref 0.61–1.24)
GFR, Estimated: 57 mL/min — ABNORMAL LOW (ref >60)
Glucose, Bld: 138 mg/dL — ABNORMAL HIGH (ref 70–99)
Potassium: 3.5 mmol/L (ref 3.5–5.1)
Sodium: 134 mmol/L — ABNORMAL LOW (ref 135–145)
Total Bilirubin: 0.4 mg/dL (ref 0.3–1.2)
Total Protein: 5.7 g/dL — ABNORMAL LOW (ref 6.5–8.1)

## 2021-04-30 LAB — GLUCOSE, CAPILLARY
Glucose-Capillary: 145 mg/dL — ABNORMAL HIGH (ref 70–99)
Glucose-Capillary: 157 mg/dL — ABNORMAL HIGH (ref 70–99)
Glucose-Capillary: 162 mg/dL — ABNORMAL HIGH (ref 70–99)
Glucose-Capillary: 277 mg/dL — ABNORMAL HIGH (ref 70–99)

## 2021-04-30 SURGERY — ABDOMINAL AORTOGRAM W/LOWER EXTREMITY
Anesthesia: LOCAL

## 2021-04-30 MED ORDER — FENTANYL CITRATE (PF) 100 MCG/2ML IJ SOLN
INTRAMUSCULAR | Status: AC
  Filled 2021-04-30: qty 2

## 2021-04-30 MED ORDER — SODIUM CHLORIDE 0.9% FLUSH: 3.0000 mL | INTRAVENOUS | Status: DC | PRN

## 2021-04-30 MED ORDER — HYDROCODONE-ACETAMINOPHEN 5-325 MG PO TABS
ORAL_TABLET | ORAL | Status: AC
  Filled 2021-04-30: qty 1

## 2021-04-30 MED ORDER — HEPARIN (PORCINE) IN NACL 1000-0.9 UT/500ML-% IV SOLN
INTRAVENOUS | Status: AC
  Filled 2021-04-30: qty 1000

## 2021-04-30 MED ORDER — LABETALOL HCL 5 MG/ML IV SOLN
INTRAVENOUS | Status: AC
  Filled 2021-04-30: qty 4

## 2021-04-30 MED ORDER — SODIUM CHLORIDE 0.9 % IV SOLN: 250.0000 mL | INTRAVENOUS | Status: DC | PRN

## 2021-04-30 MED ORDER — IODIXANOL 320 MG/ML IV SOLN
INTRAVENOUS | Status: DC | PRN
  Administered 2021-04-30: 97 mL via INTRA_ARTERIAL

## 2021-04-30 MED ORDER — SODIUM CHLORIDE 0.9 % WEIGHT BASED INFUSION: 1.0000 mL/kg/h | INTRAVENOUS | Status: AC

## 2021-04-30 MED ORDER — HEPARIN (PORCINE) IN NACL 1000-0.9 UT/500ML-% IV SOLN
INTRAVENOUS | Status: DC | PRN
  Administered 2021-04-30 (×2): 500 mL

## 2021-04-30 MED ORDER — MIDAZOLAM HCL 2 MG/2ML IJ SOLN
INTRAMUSCULAR | Status: AC
  Filled 2021-04-30: qty 2

## 2021-04-30 MED ORDER — HYDRALAZINE HCL 20 MG/ML IJ SOLN
5.0000 mg | INTRAMUSCULAR | Status: DC | PRN
Start: 2021-04-30 — End: 2021-05-04

## 2021-04-30 MED ORDER — HEPARIN SODIUM (PORCINE) 5000 UNIT/ML IJ SOLN
5000.0000 [IU] | Freq: Three times a day (TID) | INTRAMUSCULAR | Status: DC
  Administered 2021-04-30 – 2021-05-09 (×24): 5000 [IU] via SUBCUTANEOUS
  Filled 2021-04-30 (×22): qty 1

## 2021-04-30 MED ORDER — LIDOCAINE HCL (PF) 1 % IJ SOLN
INTRAMUSCULAR | Status: DC | PRN
  Administered 2021-04-30: 20 mL via INTRADERMAL

## 2021-04-30 MED ORDER — LABETALOL HCL 5 MG/ML IV SOLN
10.0000 mg | INTRAVENOUS | Status: DC | PRN
Start: 2021-04-30 — End: 2021-05-09
  Administered 2021-04-30: 10 mg via INTRAVENOUS

## 2021-04-30 MED ORDER — FENTANYL CITRATE (PF) 100 MCG/2ML IJ SOLN
INTRAMUSCULAR | Status: DC | PRN
  Administered 2021-04-30 (×2): 25 ug via INTRAVENOUS

## 2021-04-30 MED ORDER — LIDOCAINE HCL (PF) 1 % IJ SOLN
INTRAMUSCULAR | Status: AC
  Filled 2021-04-30: qty 30

## 2021-04-30 MED ORDER — SODIUM CHLORIDE 0.9% FLUSH
3.0000 mL | Freq: Two times a day (BID) | INTRAVENOUS | Status: DC
  Administered 2021-05-02 – 2021-05-07 (×8): 3 mL via INTRAVENOUS

## 2021-04-30 MED ORDER — SODIUM CHLORIDE 0.9 % IV SOLN: INTRAVENOUS | Status: DC

## 2021-04-30 MED ORDER — MIDAZOLAM HCL 2 MG/2ML IJ SOLN
INTRAMUSCULAR | Status: DC | PRN
  Administered 2021-04-30: 1 mg via INTRAVENOUS

## 2021-04-30 SURGICAL SUPPLY — 10 items
CATH OMNI FLUSH 5F 65CM (CATHETERS) ×1 IMPLANT
KIT MICROPUNCTURE NIT STIFF (SHEATH) ×1 IMPLANT
KIT PV (KITS) ×2 IMPLANT
MAT PREVALON FULL STRYKER (MISCELLANEOUS) ×1 IMPLANT
SHEATH PINNACLE 5F 10CM (SHEATH) ×1 IMPLANT
SYR MEDRAD MARK 7 150ML (SYRINGE) ×2 IMPLANT
TRANSDUCER W/STOPCOCK (MISCELLANEOUS) ×2 IMPLANT
TRAY PV CATH (CUSTOM PROCEDURE TRAY) ×2 IMPLANT
WIRE BENTSON .035X145CM (WIRE) ×1 IMPLANT
WIRE TORQFLEX AUST .018X40CM (WIRE) ×1 IMPLANT

## 2021-04-30 NOTE — Progress Notes (Signed)
Site area: rt groin fa sheath Site Prior to Removal:  Level 0 Pressure Applied For: 20 minutes Manual:   yes Patient Status During Pull:  stable Post Pull Site:   0 Post Pull Instructions Given:  yes Post Pull Pulses Present: rt dp dopplered Dressing Applied:  gauze and tegaderm Bedrest begins @ 0900 Comments:

## 2021-04-30 NOTE — Progress Notes (Signed)
    [facility] has been requested to perform a transesophageal echocardiogram on 05/01/2021 for osteomyelitis with bacteremia.  After careful review of history and examination, the risks and benefits of transesophageal echocardiogram have been explained including risks of esophageal damage, perforation (1:10,000 risk), bleeding, pharyngeal hematoma as well as other potential complications associated with conscious sedation including aspiration, arrhythmia, respiratory failure and death. Alternatives to treatment were discussed, questions were answered. Patient is willing to proceed.   Todd Zhang presented with osteomyelitis of the foot along with bacteremia.  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  Likely will require common femoral enterectomy and femoral to TP trunk bypass in the future.  Vital signs stable.  Hemoglobin 10.8.  Platelet 308.  Azalee Course, PA-C 04/30/2021 4:07 PM

## 2021-04-30 NOTE — Progress Notes (Signed)
Progress Note    Todd Zhang  Zhang DOB: [birth year]  DOA: 04/24/2021 PCP: [name], FNP      Brief Narrative:    Medical records reviewed and are as summarized below:  Todd Zhang is a 75 y.o. male medical history of diabetes mellitus, hypertension, hyperlipidemia, COPD, anxiety, GERD, recent visit to [facility] IR on 04/21/2020 for confusion, who presented to the hospital with 1 day history of left flank pain and 2-week history of left foot pain.  He also complained of multiple falls secondary to unsteady gait.      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  (Obesity)   Severe sepsis secondary to acute osteomyelitis left great toe, diabetic foot infection, group B strep bacteremia: S/p aortogram which showed complex PVD.  Continue IV Rocephin.. 2D echo did not show any evidence of vegetation EF was estimated at 60 to 65%.  Plan for TEE.  Follow-up with cardiologist. Follow-up with vascular surgeon and ID.  Atrial fibrillation with RVR: Continue metoprolol and Cardizem.  Resume Xarelto tomorrow.  Type 2 diabetes mellitus with hyperglycemia: Hemoglobin A1c 8.3.  Continue NovoLog as needed for hyperglycemia.  Hyponatremia: Asymptomatic.  Monitor BMP.  Left flank pain: Improved.  CT renal stone was unremarkable for any acute abnormality.  Other comorbidities include CKD stage IIIb, hypertension, hyperlipidemia  Benazepril and metformin have been held.    Diet Order             Diet renal/carb modified with fluid restriction Diet-HS Snack? Nothing; Fluid restriction: 1200 mL Fluid;  Room service appropriate? Yes; Fluid consistency: Thin  Diet effective now                      Consultants: Infectious disease Vascular surgeon  Procedures: None    Medications:    atorvastatin  40 mg Oral Daily   Chlorhexidine Gluconate Cloth  6 each Topical Daily   diltiazem  240 mg Oral Daily   gabapentin  300 mg Oral TID   guaiFENesin  1,200 mg Oral BID   heparin  5,000 Units Subcutaneous Q8H   insulin aspart  0-5 Units Subcutaneous QHS   insulin aspart  0-9 Units Subcutaneous TID WC   ipratropium  0.5 mg Nebulization TID   levalbuterol  0.63 mg Nebulization TID   loratadine  10 mg Oral Daily   metoprolol succinate  100 mg Oral Daily   multivitamin with minerals  1 tablet Oral Daily   pantoprazole  40 mg Oral Daily   [START ON 05/01/2021] sodium chloride flush  3 mL Intravenous Q12H   tamsulosin  0.4 mg Oral Daily   vitamin B-12  500 mcg Oral Daily   Continuous Infusions:  sodium chloride 100 mL/hr at 04/30/21 0616   [START ON 05/01/2021] sodium chloride     sodium chloride 1 mL/kg/hr (04/30/21 0848)   cefTRIAXone (ROCEPHIN)  IV Stopped (04/30/21 1447)     Anti-infectives (From admission, onward)    Start     Dose/Rate Route Frequency Ordered Stop   04/28/21  1200  cefTRIAXone (ROCEPHIN) 2 g in sodium chloride 0.9 % 100 mL IVPB       Note to Pharmacy: Patient tolerated cephalexin in the past   2 g 200 mL/hr over 30 Minutes Intravenous Every 24 hours 04/28/21 0958     04/27/21 1200  meropenem (MERREM) 1 g in sodium chloride 0.9 % 100 mL IVPB  Status:  Discontinued        1 g 200 mL/hr over 30 Minutes Intravenous Every 8 hours 04/27/21 0845 04/28/21 0957   04/25/21 1500  vancomycin (VANCOREADY) IVPB 1250 mg/250 mL  Status:  Discontinued        1,250 mg 166.7 mL/hr over 90 Minutes Intravenous Every 24 hours 04/24/21 1348 04/27/21 0845   04/24/21 2000  meropenem (MERREM) 500 mg in sodium chloride 0.9 % 100 mL IVPB  Status:  Discontinued        500 mg 200  mL/hr over 30 Minutes Intravenous Every 8 hours 04/24/21 1337 04/27/21 0845   04/24/21 1430  vancomycin (VANCOREADY) IVPB 2000 mg/400 mL        2,000 mg 200 mL/hr over 120 Minutes Intravenous  Once 04/24/21 1345 04/24/21 1819   04/24/21 1115  ciprofloxacin (CIPRO) IVPB 400 mg        400 mg 200 mL/hr over 60 Minutes Intravenous  Once 04/24/21 1100 04/24/21 1340   04/24/21 1115  clindamycin (CLEOCIN) IVPB 600 mg        600 mg 100 mL/hr over 30 Minutes Intravenous  Once 04/24/21 1100 04/24/21 1226              Family Communication/Anticipated D/C date and plan/Code Status   DVT prophylaxis: heparin injection 5,000 Units Start: 04/30/21 2200 SCD's Start: 04/30/21 1433     Code Status: Full Code  Family Communication: None Disposition Plan:    Status is: Inpatient  Remains inpatient appropriate because:IV treatments appropriate due to intensity of illness or inability to take PO and Inpatient level of care appropriate due to severity of illness  Dispo: The patient is from: Home              Anticipated d/c is to: Home              Patient currently is not medically stable to d/c.   Difficult to place patient No           Subjective:   Interval events noted.  He complains of pain in the left foot.  Objective:    Vitals:   04/30/21 1135 04/30/21 1235 04/30/21 1335 04/30/21 1432  BP: (!) 129/54 125/83 113/63 (!) 106/55  Pulse: (!) 121 60 84 83  Resp: 20 (!) 23 15 17   Temp:    97.7 F (36.5 C)  TempSrc:    Oral  SpO2: 97% 98% 95% 98%  Weight:    100.4 kg  Height:    6' (1.829 m)   No data found.   Intake/Output Summary (Last 24 hours) at 04/30/2021 1506 Last data filed at 04/30/2021 0616 Gross per 24 hour  Intake 100 ml  Output 200 ml  Net -100 ml   Filed Weights   04/24/21 2340 04/25/21 2002 04/30/21 1432  Weight: 100.1 kg 102.6 kg 100.4 kg    Exam:  GEN: NAD SKIN: Warm and dry EYES: No pallor or icterus ENT: MMM CV: Irregular rate and  rhythm PULM: CTA B ABD: soft, obese, NT, +BS CNS: AAO x 3, non focal EXT: Some redness on the dorsal  aspect of the proximal left foot.  Wound on the left big toe and the webspace between the left first and second toe     Data Reviewed:   I have personally reviewed following labs and imaging studies:  Labs: Labs show the following:   Basic Metabolic Panel: Recent Labs  Lab 04/26/21 0701 04/27/21 0403 04/28/21 0749 04/29/21 0630 04/30/21 0250  NA 131* 131* 134* 132* 134*  K 3.8 3.8 3.8 3.6 3.5  CL 103 100 100 101 100  CO2 23 23 25 24 25   GLUCOSE 194* 169* 199* 193* 138*  BUN 21 19 13 16 15   CREATININE 1.55* 1.31* 1.17 1.28* 1.30*  CALCIUM 7.9* 7.7* 8.4* 8.3* 8.6*  MG 1.6* 1.8 1.6* 1.6*  --   PHOS  --   --  2.4* 2.7  --    GFR Estimated Creatinine Clearance: 60.2 mL/min (A) (by C-G formula based on SCr of 1.3 mg/dL (H)). Liver Function Tests: Recent Labs  Lab 04/24/21 1041 04/26/21 0701 04/29/21 0630 04/30/21 0250  AST 40 36 64* 89*  ALT 33 29 51* 68*  ALKPHOS 65 80 91 78  BILITOT 1.1 0.8 0.7 0.4  PROT 6.9 6.4* 5.9* 5.7*  ALBUMIN 3.1* 2.5* 2.1* 2.0*   No results for input(s): LIPASE, AMYLASE in the last 168 hours. No results for input(s): AMMONIA in the last 168 hours. Coagulation profile Recent Labs  Lab 04/24/21 1041  INR 1.4*    CBC: Recent Labs  Lab 04/24/21 1041 04/25/21 0558 04/27/21 0403 04/28/21 0749 04/28/21 1633 04/29/21 0630 04/30/21 0250  WBC 14.3*   < > 9.3 9.1 9.2 11.0* 10.0  NEUTROABS 12.0*  --   --   --  7.4 9.1* 7.4  HGB 12.4*   < > 10.9* 11.3* 10.5* 10.9* 10.8*  HCT 35.7*   < > 32.9* 32.6* 30.0* 31.4* 31.5*  MCV 91.8   < > 93.7 88.8 89.0 88.2 89.0  PLT 188   < > 208 269 280 287 308   < > = values in this interval not displayed.   Cardiac Enzymes: No results for input(s): CKTOTAL, CKMB, CKMBINDEX, TROPONINI in the last 168 hours. BNP (last 3 results) No results for input(s): PROBNP in the last 8760 hours. CBG: Recent  Labs  Lab 04/29/21 0745 04/29/21 1131 04/29/21 1523 04/29/21 2115 04/30/21 0841  GLUCAP 169* 191* 228* 140* 145*   D-Dimer: No results for input(s): DDIMER in the last 72 hours. Hgb A1c: No results for input(s): HGBA1C in the last 72 hours. Lipid Profile: No results for input(s): CHOL, HDL, LDLCALC, TRIG, CHOLHDL, LDLDIRECT in the last 72 hours. Thyroid function studies: No results for input(s): TSH, T4TOTAL, T3FREE, THYROIDAB in the last 72 hours.  Invalid input(s): FREET3 Anemia work up: No results for input(s): VITAMINB12, FOLATE, FERRITIN, TIBC, IRON, RETICCTPCT in the last 72 hours. Sepsis Labs: Recent Labs  Lab 04/24/21 1041 04/24/21 1215 04/25/21 0558 04/25/21 1653 04/26/21 0701 04/28/21 0749 04/28/21 1633 04/29/21 0630 04/30/21 0250  WBC 14.3*  --    < >  --    < > 9.1 9.2 11.0* 10.0  LATICACIDVEN 1.3 2.2*  --  0.8  --   --   --   --   --    < > = values in this interval not displayed.    Microbiology Recent Results (from the past 240 hour(s))  Blood culture (routine single)     Status: Abnormal   Collection Time: 04/24/21 10:42 AM   Specimen:  Right Antecubital; Blood  Result Value Ref Range Status   Specimen Description   Final    RIGHT ANTECUBITAL Performed at [facility], [address] [place], [place] [address]    Special Requests   Final    BOTTLES DRAWN AEROBIC AND ANAEROBIC Blood Culture adequate volume Performed at [facility], [address], [place], [place] [address]    Culture  Setup Time   Final    GRAM POSITIVE COCCI Gram Stain Report Called to,Read Back By and Verified With: BRAME,M @ 2048 04/24/21 BY STEPHTR IN BOTH AEROBIC AND ANAEROBIC BOTTLES CRITICAL RESULT CALLED TO, READ BACK BY AND VERIFIED WITH: RN N.MOODY AT 0146 ON 04/25/2021 BY T.SAAD. Performed at [facility], [address], [place], [place] [address]    Culture GROUP B STREP(S.AGALACTIAE)ISOLATED (A)  Final   Report Status 04/27/2021 FINAL  Final   Organism ID,  Bacteria GROUP B STREP(S.AGALACTIAE)ISOLATED  Final      Susceptibility   Group b strep(s.agalactiae)isolated - MIC*    CLINDAMYCIN >=1 RESISTANT Resistant     AMPICILLIN <=0.25 SENSITIVE Sensitive     ERYTHROMYCIN >=8 RESISTANT Resistant     VANCOMYCIN 0.5 SENSITIVE Sensitive     CEFTRIAXONE <=0.12 SENSITIVE Sensitive     LEVOFLOXACIN 0.5 SENSITIVE Sensitive     PENICILLIN Value in next row Sensitive      SENSITIVE0.06    * GROUP B STREP(S.AGALACTIAE)ISOLATED  Blood Culture ID Panel (Reflexed)     Status: Abnormal   Collection Time: 04/24/21 10:42 AM  Result Value Ref Range Status   Enterococcus faecalis NOT DETECTED NOT DETECTED Final   Enterococcus Faecium NOT DETECTED NOT DETECTED Final   Listeria monocytogenes NOT DETECTED NOT DETECTED Final   Staphylococcus species NOT DETECTED NOT DETECTED Final   Staphylococcus aureus (BCID) NOT DETECTED NOT DETECTED Final   Staphylococcus epidermidis NOT DETECTED NOT DETECTED Final   Staphylococcus lugdunensis NOT DETECTED NOT DETECTED Final   Streptococcus species DETECTED (A) NOT DETECTED Final    Comment: CRITICAL RESULT CALLED TO, READ BACK BY AND VERIFIED WITH: RN N.MOODY AT 0146 ON 04/25/2021 BY T.SAAD.    Streptococcus agalactiae DETECTED (A) NOT DETECTED Final    Comment: CRITICAL RESULT CALLED TO, READ BACK BY AND VERIFIED WITH: RN N.MOODY AT 0146 ON 04/25/2021 BY T.SAAD.    Streptococcus pneumoniae NOT DETECTED NOT DETECTED Final   Streptococcus pyogenes NOT DETECTED NOT DETECTED Final   A.calcoaceticus-baumannii NOT DETECTED NOT DETECTED Final   Bacteroides fragilis NOT DETECTED NOT DETECTED Final   Enterobacterales NOT DETECTED NOT DETECTED Final   Enterobacter cloacae complex NOT DETECTED NOT DETECTED Final   Escherichia coli NOT DETECTED NOT DETECTED Final   Klebsiella aerogenes NOT DETECTED NOT DETECTED Final   Klebsiella oxytoca NOT DETECTED NOT DETECTED Final   Klebsiella pneumoniae NOT DETECTED NOT DETECTED Final    Proteus species NOT DETECTED NOT DETECTED Final   Salmonella species NOT DETECTED NOT DETECTED Final   Serratia marcescens NOT DETECTED NOT DETECTED Final   Haemophilus influenzae NOT DETECTED NOT DETECTED Final   Neisseria meningitidis NOT DETECTED NOT DETECTED Final   Pseudomonas aeruginosa NOT DETECTED NOT DETECTED Final   Stenotrophomonas maltophilia NOT DETECTED NOT DETECTED Final   Candida albicans NOT DETECTED NOT DETECTED Final   Candida auris NOT DETECTED NOT DETECTED Final   Candida glabrata NOT DETECTED NOT DETECTED Final   Candida krusei NOT DETECTED NOT DETECTED Final   Candida parapsilosis NOT DETECTED NOT DETECTED Final   Candida tropicalis NOT  DETECTED NOT DETECTED Final   Cryptococcus neoformans/gattii NOT DETECTED NOT DETECTED Final    Comment: Performed at [facility], [address] [place], [place] [address]  Urine Culture     Status: Abnormal   Collection Time: 04/24/21 11:20 AM   Specimen: In/Out Cath Urine  Result Value Ref Range Status   Specimen Description   Final    IN/OUT CATH URINE Performed at [facility], [address], [place], [place] [address]    Special Requests   Final    NONE Performed at [facility], [address], [place], [place] [address]    Culture (A)  Final    10,000 COLONIES/mL GROUP B STREP(S.AGALACTIAE)ISOLATED TESTING AGAINST S. AGALACTIAE NOT ROUTINELY PERFORMED DUE TO PREDICTABILITY OF AMP/PEN/VAN SUSCEPTIBILITY. Performed at [facility], [address], [place], [place] [address]    Report Status 04/25/2021 FINAL  Final  Resp Panel by RT-PCR (Flu A&B, Covid) Nasopharyngeal Swab     Status: None   Collection Time: 04/24/21  1:15 PM   Specimen: Nasopharyngeal Swab; Nasopharyngeal(NP) swabs in vial transport medium  Result Value Ref Range Status   SARS Coronavirus 2 by RT PCR NEGATIVE NEGATIVE Final    Comment: (NOTE) SARS-CoV-2 target nucleic acids are NOT DETECTED.  The SARS-CoV-2 RNA is generally detectable  in upper respiratory specimens during the acute phase of infection. The lowest concentration of SARS-CoV-2 viral copies this assay can detect is 138 copies/mL. A negative result does not preclude SARS-Cov-2 infection and should not be used as the sole basis for treatment or other patient management decisions. A negative result may occur with  improper specimen collection/handling, submission of specimen other than nasopharyngeal swab, presence of viral mutation(s) within the areas targeted by this assay, and inadequate number of viral copies(<138 copies/mL). A negative result must be combined with clinical observations, patient history, and epidemiological information. The expected result is Negative.  Fact Sheet for Patients:  BloggerCourse.com  Fact Sheet for Healthcare Providers:  SeriousBroker.it  This test is no t yet approved or cleared by the [place] FDA and  has been authorized for detection and/or diagnosis of SARS-CoV-2 by FDA under an Emergency Use Authorization (EUA). This EUA will remain  in effect (meaning this test can be used) for the duration of the COVID-19 declaration under Section 564(b)(1) of the Act, 21 U.S.C.section 360bbb-3(b)(1), unless the authorization is terminated  or revoked sooner.       Influenza A by PCR NEGATIVE NEGATIVE Final   Influenza B by PCR NEGATIVE NEGATIVE Final    Comment: (NOTE) The Xpert Xpress SARS-CoV-2/FLU/RSV plus assay is intended as an aid in the diagnosis of influenza from Nasopharyngeal swab specimens and should not be used as a sole basis for treatment. Nasal washings and aspirates are unacceptable for Xpert Xpress SARS-CoV-2/FLU/RSV testing.  Fact Sheet for Patients: BloggerCourse.com  Fact Sheet for Healthcare Providers: SeriousBroker.it  This test is not yet approved or cleared by the [place] FDA and has  been authorized for detection and/or diagnosis of SARS-CoV-2 by FDA under an Emergency Use Authorization (EUA). This EUA will remain in effect (meaning this test can be used) for the duration of the COVID-19 declaration under Section 564(b)(1) of the Act, 21 U.S.C. section 360bbb-3(b)(1), unless the authorization is terminated or revoked.  Performed at [facility], [address] [place], [place] [address]   Culture, blood (Routine X 2) w Reflex to ID Panel     Status: None   Collection  Time: 04/25/21  7:26 AM   Specimen: BLOOD RIGHT ARM  Result Value Ref Range Status   Specimen Description BLOOD RIGHT ARM  Final   Special Requests   Final    BOTTLES DRAWN AEROBIC AND ANAEROBIC Blood Culture adequate volume   Culture   Final    NO GROWTH 5 DAYS Performed at [facility], [address], [place], [place] [address]    Report Status 04/30/2021 FINAL  Final  Culture, blood (Routine X 2) w Reflex to ID Panel     Status: None   Collection Time: 04/25/21  7:32 AM   Specimen: BLOOD RIGHT ARM  Result Value Ref Range Status   Specimen Description BLOOD RIGHT ARM  Final   Special Requests   Final    BOTTLES DRAWN AEROBIC AND ANAEROBIC Blood Culture adequate volume   Culture   Final    NO GROWTH 5 DAYS Performed at [facility], [address] [place], [place] [address]    Report Status 04/30/2021 FINAL  Final  MRSA Next Gen by PCR, Nasal     Status: None   Collection Time: 04/25/21  8:14 PM   Specimen: Nasal Mucosa; Nasal Swab  Result Value Ref Range Status   MRSA by PCR Next Gen NOT DETECTED NOT DETECTED Final    Comment: (NOTE) The GeneXpert MRSA Assay (FDA approved for NASAL specimens only), is one component of a comprehensive MRSA colonization surveillance program. It is not intended to diagnose MRSA infection nor to guide or monitor treatment for MRSA infections. Test performance is not FDA approved in patients less than 61 years old. Performed at [facility], [address] [place], [place] [address]     Procedures and diagnostic studies:  PERIPHERAL VASCULAR CATHETERIZATION  Result Date: 04/30/2021 DATE OF SERVICE: 04/30/2021  PATIENT:  Todd Zhang  75 y.o. 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 Lenell Antu, MD  ASSISTANT: none  ANESTHESIA:   local and IV sedation  ESTIMATED BLOOD LOSS: minimal  LOCAL MEDICATIONS USED:  LIDOCAINE  COUNTS: confirmed correct.  PATIENT DISPOSITION:  PACU - hemodynamically stable.  Delay start of Pharmacological VTE agent (>24hrs) due to surgical blood loss or risk of bleeding: no  INDICATION FOR PROCEDURE: Todd Zhang is a 75 y.o. male with ulceration and gangrene of the left great toe and non-invasive evidence of severe peripheral arterial disease. After careful discussion of risks, benefits, and alternatives the patient was offered angiogram. The patient understood and wished to proceed.  OPERATIVE FINDINGS:  Terminal aorta and iliac arteries: Heavily calcified / stenotic terminal aorta (>70% stenosis) / common iliac arteries (>75% stenosis) Internal iliac arteries patent bilaterally External iliac arteries patient bilaterally  Right lower extremity: Common femoral artery: severe plaque. Sheath is occlusive of flow, making visualization of the remaining arteries limited Profunda femoris artery: Patent Superficial femoral artery: occluded Popliteal artery: reconstitutes below the knee Anterior tibial artery: patent at its origin, but occludes in the calf Tibioperoneal trunk: patent Peroneal artery: patent Posterior tibial artery: patent, dominant flow to the foot Pedal circulation: not evaluated  Left lower extremity: Common femoral  artery: Significant calcified plaque Profunda femoris artery: widely patent Superficial femoral artery: Heavily calcified and stenotic. Short segment  chronic total occlusion at Hunter's canal. Popliteal artery: heavily calcified and stenotic (greatest stenosis >95% behind the knee). Terminal popliteal artery / proximal TP trunk heavily calcified and stenosed (90%). Anterior tibial artery: occluded Tibioperoneal trunk: patent, but calcified Peroneal artery: patent to the ankle Posterior tibial artery: dominant. Flow to the foot. Fills the pedal arch Pedal circulation: intact  DESCRIPTION OF PROCEDURE: After identification of the patient in the pre-operative holding area, the patient was transferred to the operating room. The patient was positioned supine on the operating room table. Anesthesia was induced. The groins was prepped and draped in standard fashion. A surgical pause was performed confirming correct patient, procedure, and operative location.  The right groin was anesthetized with subcutaneous injection of 1% lidocaine. Using ultrasound guidance, the right common femoral artery was accessed with micropuncture technique. Fluoroscopy was used to confirm cannulation over the femoral head. The 64F sheath was upsized to 74F.  A Benson wire was advanced into the distal aorta. Over the wire an omni flush catheter was advanced to the level of L2. Aortogram was performed - see above for details. Bilateral lower extremity runoff angiography was performed - see above for details.  Conscious sedation was administered with the use of IV fentanyl and midazolam under continuous physician and nurse monitoring.  Heart rate, blood pressure, and oxygen saturation were continuously monitored.  Total sedation time was 36 minutes  Upon completion of the case instrument and sharps counts were confirmed correct. The patient was transferred to the PACU in good condition. I was present for all portions of the procedure.  PLAN: Complex  disease pattern.  His terminal aorta and iliac arteries are heavily diseased.  He is not a physiologic candidate for open aortoiliac surgery.  The extent of disease made me leery about stenting.  I think the simplest option for limb salvage would be a common femoral endarterectomy and a femoral to TP trunk bypass.  Will discuss with Dr. Chestine Spore.  Continue best medical therapy for PAD.  Rande Brunt. Lenell Antu, MD [facility] Phone Number: [phone] 04/30/2021 8:21 AM   DG CHEST PORT 1 VIEW  Result Date: 04/29/2021 CLINICAL DATA:  Shortness of breath EXAM: PORTABLE CHEST 1 VIEW COMPARISON:  Chest radiograph 1 day prior FINDINGS: The cardiomediastinal silhouette is stable. There is vascular congestion and mild pulmonary interstitial edema. A small left pleural effusion is new/increased in size from prior. Patchy opacities in the left base likely reflect subsegmental atelectasis. There is no right effusion. There is no pneumothorax. The bones are unremarkable. IMPRESSION: 1. New/increased small left pleural effusion with adjacent opacities likely reflecting subsegmental atelectasis. 2. Similar degree of pulmonary interstitial edema. Electronically Signed   By: Lesia Hausen M.D.   On: 04/29/2021 08:30               LOS: 6 days   Todd Zhang  [facility]   Pager on [email or web address]. If 7PM-7AM, please contact night-coverage at [email or web address]     04/30/2021, 3:06 PM

## 2021-04-30 NOTE — Progress Notes (Signed)
Pt arrived from PACU, groin level 0 no bleeding, CHG complete, orders released, Telebox [id number].  Kalman Jewels, RN 04/30/2021 2:42 PM

## 2021-04-30 NOTE — Interval H&P Note (Signed)
History and Physical Interval Note:  04/30/2021 7:19 AM  Todd Zhang  has presented today for surgery, with the diagnosis of left foot wound.  The various methods of treatment have been discussed with the patient and family. After consideration of risks, benefits and other options for treatment, the patient has consented to  Procedure(s): ABDOMINAL AORTOGRAM W/LOWER EXTREMITY (N/A) as a surgical intervention.  The patient's history has been reviewed, patient examined, no change in status, stable for surgery.  I have reviewed the patient's chart and labs.  Questions were answered to the patient's satisfaction.     Leonie Douglas

## 2021-04-30 NOTE — Progress Notes (Deleted)
Subjective:     Chief Complaint: medical management of chronic issues     HPI:  1. Essential hypertension No c/o chest pain sob or headache.does nt check blood pressure at home. BP Readings from Last 3 Encounters:  04/30/21 125/83  01/28/21 119/67  10/17/20 124/65     2. Atrial fibrillation with RVR (HCC) No c/o palpitations or heart racing. He is on xeralto with no side effects.  3. Peripheral artery disease (HCC) Has very poor circualtion of bil lowe rext. Has had 2 toe amputations and currently has a sore on his left great toe  4. COPD exacerbation (HCC) He is currently on no inhalers  5. Gastroesophageal reflux disease without esophagitis Is on daily dose of omeprazole an dis doing well.  6. Type 2 diabetes mellitus with diabetic neuropathy, with long-term current use of insulin (HCC) He is on insulin and metformin. He does not check his blood sugars every day. Is not good at watching his diet. Lab Results  Component Value Date   HGBA1C 8.3 (H) 04/24/2021    7. 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 He currently has an infection on his left great toe. He went to the ED several days ago and they cleaned it and dressed it.  8. Benign prostatic hyperplasia with incomplete bladder emptying Denies any problems voidin  9. Stage 3b chronic kidney disease (HCC) Lab Results  Component Value Date   CREATININE 1.30 (H) 04/30/2021     10. Pure hypercholesterolemia Does not watch diet and does no dedicated exercise.  11. Generalized anxiety disorder Stays anxious  12. Chronic low back pain- He is on neurontin which seem to help. Rate [place] 7/10 currently. Pain stays in lower back.  13. BMI 30.0-30.9 No recent weight changes Wt Readings from Last 3 Encounters:  04/25/21 226 lb 3.1 oz (102.6 kg)  01/28/21 229 lb (103.9 kg)  10/17/20 231 lb (104.8 kg)   BMI Readings from Last 3 Encounters:  04/25/21 30.68 kg/m  01/28/21  31.06 kg/m  10/17/20 31.33 kg/m    Facility-Administered Encounter Medications as of 04/30/2021  Medication   0.9 %  sodium chloride infusion   [MAR Hold] acetaminophen (TYLENOL) tablet 650 mg   Or   [MAR Hold] acetaminophen (TYLENOL) suppository 650 mg   [MAR Hold] atorvastatin (LIPITOR) tablet 40 mg   [MAR Hold] cefTRIAXone (ROCEPHIN) 2 g in sodium chloride 0.9 % 100 mL IVPB   [MAR Hold] Chlorhexidine Gluconate Cloth 2 % PADS 6 each   [MAR Hold] diltiazem (CARDIZEM CD) 24 hr capsule 240 mg   [MAR Hold] gabapentin (NEURONTIN) capsule 300 mg   [MAR Hold] guaiFENesin (MUCINEX) 12 hr tablet 1,200 mg   hydrALAZINE (APRESOLINE) injection 5 mg   [MAR Hold] HYDROcodone-acetaminophen (NORCO/VICODIN) 5-325 MG per tablet 1 tablet   [MAR Hold] insulin aspart (novoLOG) injection 0-5 Units   [MAR Hold] insulin aspart (novoLOG) injection 0-9 Units   [MAR Hold] ipratropium (ATROVENT) nebulizer solution 0.5 mg   labetalol (NORMODYNE) injection 10 mg   [MAR Hold] levalbuterol (XOPENEX) nebulizer solution 0.63 mg   [MAR Hold] loratadine (CLARITIN) tablet 10 mg   [MAR Hold] metoprolol succinate (TOPROL-XL) 24 hr tablet 100 mg   [MAR Hold] multivitamin with minerals tablet 1 tablet   [MAR Hold] ondansetron (ZOFRAN) tablet 4 mg   Or   [MAR Hold] ondansetron (ZOFRAN) injection 4 mg   [MAR Hold] pantoprazole (PROTONIX) EC tablet 40 mg   [MAR Hold] sodium  chloride (OCEAN) 0.65 % nasal spray 1 spray   [MAR Hold] tamsulosin (FLOMAX) capsule 0.4 mg   [MAR Hold] vitamin B-12 (CYANOCOBALAMIN) tablet 500 mcg   Outpatient Encounter Medications as of 04/30/2021  Medication Sig   alendronate (FOSAMAX) 70 MG tablet TAKE 1 TABLET BY MOUTH ONCE A WEEK. TAKE WITH A FULL GLASS OF WATER ON AN EMPTY STOMACH   atorvastatin (LIPITOR) 40 MG tablet Take 1 tablet (40 mg total) by mouth daily.   benazepril (LOTENSIN) 5 MG tablet TAKE 1 TABLET BY MOUTH EVERY DAY   cetirizine (ZYRTEC) 10 MG tablet Take 10 mg by mouth daily.     fenofibrate (TRICOR) 145 MG tablet Take 1 tablet (145 mg total) by mouth daily.   gabapentin (NEURONTIN) 300 MG capsule Take 1 capsule (300 mg total) by mouth 4 (four) times daily. TAKE 1 CAPSULE BY MOUTH THREE TIMES DAILY FOR PAIN   glucose blood (ACCU-CHEK AVIVA PLUS) test strip USE TO check blood glucose 4 times daily   HYDROcodone-acetaminophen (NORCO/VICODIN) 5-325 MG tablet Take 1 tablet by mouth every 4 (four) hours as needed. (Patient not taking: No sig reported)   insulin regular (HUMULIN R) 100 units/mL injection INJECT 10 UNITS INTO THE SKIN THREE TIMES DAILY BEFORE MEALS   Insulin Syringe-Needle U-100 (GLOBAL INJECT EASE INSULIN SYR) 31G X 5/16" 0.3 ML MISC USE WITH INSULIN EVERY DAY Dx E11.40   metFORMIN (GLUCOPHAGE) 500 MG tablet Take 1 tablet (500 mg total) by mouth 2 (two) times daily.   metoprolol succinate (TOPROL-XL) 50 MG 24 hr tablet TAKE 1 TABLET BY MOUTH DAILY, TAKE WITH FOOD OR IMMEDIATELY FOLLOWING A MEAL   Multiple Vitamin (MULTIVITAMIN) tablet Take 1 tablet by mouth daily.   mupirocin ointment (BACTROBAN) 2 % Apply 1 application topically daily. Left Great Toe   omeprazole (PRILOSEC) 20 MG capsule Take 1 capsule (20 mg total) by mouth daily.   tamsulosin (FLOMAX) 0.4 MG CAPS capsule Take 1 capsule (0.4 mg total) by mouth daily.   triamcinolone cream (KENALOG) 0.5 % APPLY TO THE AFFECTED AREA(S) THREE TIMES DAILY   vitamin B-12 (CYANOCOBALAMIN) 500 MCG tablet Take 500 mcg by mouth daily.   XARELTO 20 MG TABS tablet Take 1 tablet (20 mg total) by mouth daily.    Past Surgical History:  Procedure Laterality Date   AMPUTATION TOE     KNEE SURGERY Left    NOSE SURGERY      Family History  Problem Relation Age of Onset   Stroke Mother    Heart disease Mother        CABG   Cancer Father     New complaints: ***  Social history: ***  Controlled substance contract: ***      Review of Systems     Objective:   Physical Exam        Assessment &  Plan:

## 2021-04-30 NOTE — Op Note (Addendum)
DATE OF SERVICE: 04/30/2021  PATIENT:  Todd Zhang  75 y.o. 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 Lenell Antu, MD  ASSISTANT: none  ANESTHESIA:   local and IV sedation  ESTIMATED BLOOD LOSS: minimal  LOCAL MEDICATIONS USED:  LIDOCAINE   COUNTS: confirmed correct.  PATIENT DISPOSITION:  PACU - hemodynamically stable.   Delay start of Pharmacological VTE agent (>24hrs) due to surgical blood loss or risk of bleeding: no  INDICATION FOR PROCEDURE: Todd Zhang is a 75 y.o. male with ulceration and gangrene of the left great toe and non-invasive evidence of severe peripheral arterial disease. After careful discussion of risks, benefits, and alternatives the patient was offered angiogram. The patient understood and wished to proceed.  OPERATIVE FINDINGS:   Terminal aorta and iliac arteries: Heavily calcified / stenotic terminal aorta (>70% stenosis) / common iliac arteries (>75% stenosis) Internal iliac arteries patent bilaterally External iliac arteries patient bilaterally  Right lower extremity: Common femoral artery: severe plaque. Sheath is occlusive of flow, making visualization of the remaining arteries limited Profunda femoris artery: Patent  Superficial femoral artery: occluded Popliteal artery: reconstitutes below the knee Anterior tibial artery: patent at its origin, but occludes in the calf Tibioperoneal trunk: patent Peroneal artery: patent Posterior tibial artery: patent, dominant flow to the foot Pedal circulation: not evaluated  Left lower extremity: Common femoral artery: Significant calcified plaque  Profunda femoris artery: widely patent   Superficial femoral artery: Heavily calcified and stenotic. Short segment chronic total occlusion at Hunter's canal.  Popliteal artery: heavily calcified and stenotic (greatest stenosis >95% behind the knee). Terminal popliteal artery / proximal TP trunk heavily calcified and stenosed (90%).  Anterior tibial artery: occluded Tibioperoneal trunk: patent, but calcified Peroneal artery: patent to the ankle Posterior tibial artery: dominant. Flow to the foot. Fills the pedal arch Pedal circulation: intact  DESCRIPTION OF PROCEDURE: After identification of the patient in the pre-operative holding area, the patient was transferred to the operating room. The patient was positioned supine on the operating room table. Anesthesia was induced. The groins was prepped and draped in standard fashion. A surgical pause was performed confirming correct patient, procedure, and operative location.  The right groin was anesthetized with subcutaneous injection of 1% lidocaine. Using ultrasound guidance, the right common femoral artery was accessed with micropuncture technique. Fluoroscopy was used to confirm cannulation over the femoral head. The 51F sheath was upsized to 66F.   A Benson wire was advanced into the distal aorta. Over the wire an omni flush catheter was advanced to the level of L2. Aortogram was performed - see above for details. Bilateral lower extremity runoff angiography was performed - see above for details.  Conscious sedation was administered with the use of IV fentanyl and midazolam under continuous physician and nurse monitoring.  Heart rate, blood pressure, and oxygen saturation were continuously monitored.  Total sedation time was 36 minutes  Upon completion of the case instrument and sharps counts were confirmed correct. The patient was transferred to the PACU in good condition. I was present for all portions of the procedure.  PLAN: Complex disease pattern.  His terminal aorta and iliac  arteries are heavily diseased.  He is not a physiologic candidate for  open aortoiliac surgery.  The extent of disease made me leery about stenting.  I think the simplest option for limb salvage would be a common femoral endarterectomy and a femoral to TP trunk bypass.  Will discuss with Dr. Chestine Spore.  Continue best medical therapy for PAD. Check vein mapping.  Rande Brunt. Lenell Antu, MD [facility] Phone Number: [phone] 04/30/2021 8:21 AM

## 2021-05-01 ENCOUNTER — Encounter (HOSPITAL_COMMUNITY)
Admission: EM | Disposition: A | Discharge status: Skilled Nursing Facility | Payer: Self-pay | Source: Home / Self Care | Attending: Internal Medicine

## 2021-05-01 ENCOUNTER — Encounter (HOSPITAL_COMMUNITY): Payer: Self-pay | Admitting: Internal Medicine

## 2021-05-01 ENCOUNTER — Inpatient Hospital Stay (HOSPITAL_COMMUNITY): Payer: Medicare Other | Admitting: Anesthesiology

## 2021-05-01 ENCOUNTER — Inpatient Hospital Stay (HOSPITAL_COMMUNITY): Payer: Medicare Other

## 2021-05-01 ENCOUNTER — Encounter (HOSPITAL_COMMUNITY): Payer: Medicare Other

## 2021-05-01 DIAGNOSIS — L03119 Cellulitis of unspecified part of limb: Secondary | ICD-10-CM | POA: Diagnosis not present

## 2021-05-01 DIAGNOSIS — I4891 Unspecified atrial fibrillation: Secondary | ICD-10-CM | POA: Diagnosis not present

## 2021-05-01 DIAGNOSIS — M86172 Other acute osteomyelitis, left ankle and foot: Secondary | ICD-10-CM | POA: Diagnosis not present

## 2021-05-01 DIAGNOSIS — I340 Nonrheumatic mitral (valve) insufficiency: Secondary | ICD-10-CM | POA: Diagnosis not present

## 2021-05-01 DIAGNOSIS — E11628 Type 2 diabetes mellitus with other skin complications: Secondary | ICD-10-CM | POA: Diagnosis not present

## 2021-05-01 DIAGNOSIS — R7881 Bacteremia: Secondary | ICD-10-CM

## 2021-05-01 DIAGNOSIS — L02619 Cutaneous abscess of unspecified foot: Secondary | ICD-10-CM | POA: Diagnosis not present

## 2021-05-01 HISTORY — PX: TEE WITHOUT CARDIOVERSION: SHX5443

## 2021-05-01 LAB — COMPREHENSIVE METABOLIC PANEL
ALT: 56 U/L — ABNORMAL HIGH (ref 0–44)
AST: 49 U/L — ABNORMAL HIGH (ref 15–41)
Albumin: 1.9 g/dL — ABNORMAL LOW (ref 3.5–5.0)
Alkaline Phosphatase: 81 U/L (ref 38–126)
Anion gap: 7 (ref 5–15)
BUN: 13 mg/dL (ref 8–23)
CO2: 24 mmol/L (ref 22–32)
Calcium: 8.1 mg/dL — ABNORMAL LOW (ref 8.9–10.3)
Chloride: 105 mmol/L (ref 98–111)
Creatinine, Ser: 1.1 mg/dL (ref 0.61–1.24)
GFR, Estimated: >60 mL/min (ref >60)
Glucose, Bld: 140 mg/dL — ABNORMAL HIGH (ref 70–99)
Potassium: 3.7 mmol/L (ref 3.5–5.1)
Sodium: 136 mmol/L (ref 135–145)
Total Bilirubin: 0.4 mg/dL (ref 0.3–1.2)
Total Protein: 5.5 g/dL — ABNORMAL LOW (ref 6.5–8.1)

## 2021-05-01 LAB — GLUCOSE, CAPILLARY
Glucose-Capillary: 126 mg/dL — ABNORMAL HIGH (ref 70–99)
Glucose-Capillary: 147 mg/dL — ABNORMAL HIGH (ref 70–99)
Glucose-Capillary: 172 mg/dL — ABNORMAL HIGH (ref 70–99)
Glucose-Capillary: 214 mg/dL — ABNORMAL HIGH (ref 70–99)

## 2021-05-01 LAB — ECHO TEE
AV Mean grad: 8 mmHg
AV Peak grad: 15.4 mmHg
Ao pk vel: 1.96 m/s

## 2021-05-01 SURGERY — ECHOCARDIOGRAM, TRANSESOPHAGEAL
Anesthesia: Monitor Anesthesia Care

## 2021-05-01 MED ORDER — LIDOCAINE 2% (20 MG/ML) 5 ML SYRINGE
INTRAMUSCULAR | Status: DC | PRN
  Administered 2021-05-01: 60 mg via INTRAVENOUS

## 2021-05-01 MED ORDER — PROPOFOL 10 MG/ML IV BOLUS
INTRAVENOUS | Status: DC | PRN
Start: 2021-05-01 — End: 2021-05-01
  Administered 2021-05-01 (×2): 20 mg via INTRAVENOUS

## 2021-05-01 MED ORDER — LEVALBUTEROL HCL 0.63 MG/3ML IN NEBU: 0.6300 mg | INHALATION_SOLUTION | Freq: Four times a day (QID) | RESPIRATORY_TRACT | Status: DC | PRN

## 2021-05-01 MED ORDER — SODIUM CHLORIDE 0.9 % IV SOLN
INTRAVENOUS | Status: AC | PRN
  Administered 2021-05-01: 500 mL via INTRAMUSCULAR

## 2021-05-01 MED ORDER — PROPOFOL 500 MG/50ML IV EMUL
INTRAVENOUS | Status: DC | PRN
  Administered 2021-05-01: 100 ug/kg/min via INTRAVENOUS

## 2021-05-01 MED ORDER — SODIUM CHLORIDE 0.9 % IV SOLN: INTRAVENOUS | Status: DC

## 2021-05-01 NOTE — Progress Notes (Signed)
Progress Note    Todd Zhang  ZOX:[id number] DOB: November 27, 1945  DOA: 04/24/2021 PCP: Todd Pierini, FNP      Brief Narrative:    Medical records reviewed and are as summarized below:  Todd Zhang is a 75 y.o. male medical history of diabetes mellitus, hypertension, hyperlipidemia, COPD, anxiety, GERD, recent visit to [facility] on 04/21/2020 for confusion, who presented to the hospital with 1 day history of left flank pain and 2-week history of left foot pain.  He also complained of multiple falls secondary to unsteady gait.      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  (Obesity)   Severe sepsis secondary to acute osteomyelitis left great toe, diabetic foot infection, group B strep bacteremia: S/p aortogram which showed complex PVD.  Plan for vascular surgery on Monday, 05/04/2021.  Continue IV Rocephin. 2D echo did not show any evidence of vegetation EF was estimated at 60 to 65%.  No evidence of vegetation on cardiac thrombus on TEE that was done on 05/01/2021.  Atrial fibrillation with RVR: Continue metoprolol and Cardizem.  Xarelto on hold because of plans for vascular surgery on 05/04/2021.  Type 2 diabetes mellitus with hyperglycemia: Hemoglobin A1c 8.3.  Continue NovoLog as needed for hyperglycemia.  Hyponatremia: Asymptomatic.  Monitor BMP.  Left flank pain: Improved.  CT renal stone was unremarkable for any acute abnormality.  Other comorbidities include CKD stage IIIb, hypertension, hyperlipidemia  Benazepril and metformin have been held.    Diet Order             Diet Carb Modified  Fluid consistency: Thin; Room service appropriate? Yes  Diet effective now                      Consultants: Infectious disease Vascular surgeon  Procedures: None    Medications:    atorvastatin  40 mg Oral Daily   diltiazem  240 mg Oral Daily   gabapentin  300 mg Oral TID   guaiFENesin  1,200 mg Oral BID   heparin  5,000 Units Subcutaneous Q8H   insulin aspart  0-5 Units Subcutaneous QHS   insulin aspart  0-9 Units Subcutaneous TID WC   loratadine  10 mg Oral Daily   metoprolol succinate  100 mg Oral Daily   multivitamin with minerals  1 tablet Oral Daily   pantoprazole  40 mg Oral Daily   sodium chloride flush  3 mL Intravenous Q12H   tamsulosin  0.4 mg Oral Daily   vitamin B-12  500 mcg Oral Daily   Continuous Infusions:  sodium chloride 100 mL/hr at 05/01/21 0949   cefTRIAXone (ROCEPHIN)  IV 2 g (05/01/21 1131)     Anti-infectives (From admission, onward)    Start     Dose/Rate Route Frequency Ordered Stop   04/28/21 1200  cefTRIAXone (ROCEPHIN) 2 g in sodium chloride 0.9 % 100 mL IVPB       Note to Pharmacy: Patient tolerated cephalexin in the past   2 g 200 mL/hr over 30 Minutes Intravenous  Every 24 hours 04/28/21 0958     04/27/21 1200  meropenem (MERREM) 1 g in sodium chloride 0.9 % 100 mL IVPB  Status:  Discontinued        1 g 200 mL/hr over 30 Minutes Intravenous Every 8 hours 04/27/21 0845 04/28/21 0957   04/25/21 1500  vancomycin (VANCOREADY) IVPB 1250 mg/250 mL  Status:  Discontinued        1,250 mg 166.7 mL/hr over 90 Minutes Intravenous Every 24 hours 04/24/21 1348 04/27/21 0845   04/24/21 2000  meropenem (MERREM) 500 mg in sodium chloride 0.9 % 100 mL IVPB  Status:  Discontinued        500 mg 200 mL/hr over 30 Minutes Intravenous Every 8 hours 04/24/21 1337 04/27/21 0845   04/24/21 1430  vancomycin (VANCOREADY) IVPB 2000 mg/400 mL        2,000 mg 200 mL/hr over 120 Minutes Intravenous  Once 04/24/21 1345 04/24/21 1819   04/24/21 1115   ciprofloxacin (CIPRO) IVPB 400 mg        400 mg 200 mL/hr over 60 Minutes Intravenous  Once 04/24/21 1100 04/24/21 1340   04/24/21 1115  clindamycin (CLEOCIN) IVPB 600 mg        600 mg 100 mL/hr over 30 Minutes Intravenous  Once 04/24/21 1100 04/24/21 1226              Family Communication/Anticipated D/C date and plan/Code Status   DVT prophylaxis: heparin injection 5,000 Units Start: 04/30/21 2200 SCD's Start: 04/30/21 1433     Code Status: Full Code  Family Communication: 2 sisters at the bedside Disposition Plan:    Status is: Inpatient  Remains inpatient appropriate because:IV treatments appropriate due to intensity of illness or inability to take PO and Inpatient level of care appropriate due to severity of illness  Dispo: The patient is from: Home              Anticipated d/c is to: Home              Patient currently is not medically stable to d/c.   Difficult to place patient No           Subjective:   Interval events noted. C/o pain in the left foot.  2 sisters were at the bedside.  Objective:    Vitals:   05/01/21 1025 05/01/21 1035 05/01/21 1049 05/01/21 1136  BP: (!) 125/46 (!) 144/48 (!) 145/76 140/85  Pulse: 91 99 94 86  Resp: Temp:   97.9 F (36.6 C) 98 F (36.7 C)  TempSrc:   Oral Oral  SpO2: 92% 93% 100% 97%  Weight:      Height:       No data found.   Intake/Output Summary (Last 24 hours) at 05/01/2021 1340 Last data filed at 05/01/2021 1017 Gross per 24 hour  Intake 3092.64 ml  Output 550 ml  Net 2542.64 ml   Filed Weights   04/25/21 2002 04/30/21 1432 05/01/21 0834  Weight: 102.6 kg 100.4 kg 100.4 kg    Exam:  GEN: NAD SKIN: Warm and dry EYES: No pallor or icterus ENT: MMM CV: Irregular rate and rhythm PULM: CTA B ABD: soft, obese, NT, +BS CNS: AAO x 3, non focal EXT: Erythema erythema of the proximal aspect of the left foot and left big toe.  Wound on the left big toe       Data  Reviewed:   I have personally reviewed following labs  and imaging studies:  Labs: Labs show the following:   Basic Metabolic Panel: Recent Labs  Lab 04/26/21 0701 04/27/21 0403 04/28/21 0749 04/29/21 0630 04/30/21 0250 05/01/21 0603  NA 131* 131* 134* 132* 134* 136  K 3.8 3.8 3.8 3.6 3.5 3.7  CL 103 100 100 101 100 105  CO2 23 23 25 24 25 24   GLUCOSE 194* 169* 199* 193* 138* 140*  BUN 21 19 13 16 15 13   CREATININE 1.55* 1.31* 1.17 1.28* 1.30* 1.10  CALCIUM 7.9* 7.7* 8.4* 8.3* 8.6* 8.1*  MG 1.6* 1.8 1.6* 1.6*  --   --   PHOS  --   --  2.4* 2.7  --   --    GFR Estimated Creatinine Clearance: 71.2 mL/min (by C-G formula based on SCr of 1.1 mg/dL). Liver Function Tests: Recent Labs  Lab 04/26/21 0701 04/29/21 0630 04/30/21 0250 05/01/21 0603  AST 36 64* 89* 49*  ALT 29 51* 68* 56*  ALKPHOS 80 91 78 81  BILITOT 0.8 0.7 0.4 0.4  PROT 6.4* 5.9* 5.7* 5.5*  ALBUMIN 2.5* 2.1* 2.0* 1.9*   No results for input(s): LIPASE, AMYLASE in the last 168 hours. No results for input(s): AMMONIA in the last 168 hours. Coagulation profile No results for input(s): INR, PROTIME in the last 168 hours.   CBC: Recent Labs  Lab 04/27/21 0403 04/28/21 0749 04/28/21 1633 04/29/21 0630 04/30/21 0250  WBC 9.3 9.1 9.2 11.0* 10.0  NEUTROABS  --   --  7.4 9.1* 7.4  HGB 10.9* 11.3* 10.5* 10.9* 10.8*  HCT 32.9* 32.6* 30.0* 31.4* 31.5*  MCV 93.7 88.8 89.0 88.2 89.0  PLT 208 269 280 287 308   Cardiac Enzymes: No results for input(s): CKTOTAL, CKMB, CKMBINDEX, TROPONINI in the last 168 hours. BNP (last 3 results) No results for input(s): PROBNP in the last 8760 hours. CBG: Recent Labs  Lab 04/30/21 1534 04/30/21 1627 04/30/21 2125 05/01/21 0619 05/01/21 1154  GLUCAP 157* 162* 277* 126* 147*   D-Dimer: No results for input(s): DDIMER in the last 72 hours. Hgb A1c: No results for input(s): HGBA1C in the last 72 hours. Lipid Profile: No results for input(s): CHOL, HDL, LDLCALC,  TRIG, CHOLHDL, LDLDIRECT in the last 72 hours. Thyroid function studies: No results for input(s): TSH, T4TOTAL, T3FREE, THYROIDAB in the last 72 hours.  Invalid input(s): FREET3 Anemia work up: No results for input(s): VITAMINB12, FOLATE, FERRITIN, TIBC, IRON, RETICCTPCT in the last 72 hours. Sepsis Labs: Recent Labs  Lab 04/25/21 1653 04/26/21 0701 04/28/21 0749 04/28/21 1633 04/29/21 0630 04/30/21 0250  WBC  --    < > 9.1 9.2 11.0* 10.0  LATICACIDVEN 0.8  --   --   --   --   --    < > = values in this interval not displayed.    Microbiology Recent Results (from the past 240 hour(s))  Blood culture (routine single)     Status: Abnormal   Collection Time: 04/24/21 10:42 AM   Specimen: Right Antecubital; Blood  Result Value Ref Range Status   Specimen Description   Final    RIGHT ANTECUBITAL Performed at [facility], [address] [place], [address] [place]    Special Requests   Final    BOTTLES DRAWN AEROBIC AND ANAEROBIC Blood Culture adequate volume Performed at [facility], [address], [place], [address] [place]    Culture  Setup Time   Final    GRAM POSITIVE COCCI Gram Stain Report Called to,Read Back  By and Verified With: BRAME,M @ 2048 04/24/21 BY STEPHTR IN BOTH AEROBIC AND ANAEROBIC BOTTLES CRITICAL RESULT CALLED TO, READ BACK BY AND VERIFIED WITH: RN N.MOODY AT 0146 ON 04/25/2021 BY T.SAAD. Performed at [facility], [address] [place], [place] [address]    Culture GROUP B STREP(S.AGALACTIAE)ISOLATED (A)  Final   Report Status 04/27/2021 FINAL  Final   Organism ID, Bacteria GROUP B STREP(S.AGALACTIAE)ISOLATED  Final      Susceptibility   Group b strep(s.agalactiae)isolated - MIC*    CLINDAMYCIN >=1 RESISTANT Resistant     AMPICILLIN <=0.25 SENSITIVE Sensitive     ERYTHROMYCIN >=8 RESISTANT Resistant     VANCOMYCIN 0.5 SENSITIVE Sensitive     CEFTRIAXONE <=0.12 SENSITIVE Sensitive     LEVOFLOXACIN 0.5 SENSITIVE Sensitive     PENICILLIN Value  in next row Sensitive      SENSITIVE0.06    * GROUP B STREP(S.AGALACTIAE)ISOLATED  Blood Culture ID Panel (Reflexed)     Status: Abnormal   Collection Time: 04/24/21 10:42 AM  Result Value Ref Range Status   Enterococcus faecalis NOT DETECTED NOT DETECTED Final   Enterococcus Faecium NOT DETECTED NOT DETECTED Final   Listeria monocytogenes NOT DETECTED NOT DETECTED Final   Staphylococcus species NOT DETECTED NOT DETECTED Final   Staphylococcus aureus (BCID) NOT DETECTED NOT DETECTED Final   Staphylococcus epidermidis NOT DETECTED NOT DETECTED Final   Staphylococcus lugdunensis NOT DETECTED NOT DETECTED Final   Streptococcus species DETECTED (A) NOT DETECTED Final    Comment: CRITICAL RESULT CALLED TO, READ BACK BY AND VERIFIED WITH: RN N.MOODY AT 0146 ON 04/25/2021 BY T.SAAD.    Streptococcus agalactiae DETECTED (A) NOT DETECTED Final    Comment: CRITICAL RESULT CALLED TO, READ BACK BY AND VERIFIED WITH: RN N.MOODY AT 0146 ON 04/25/2021 BY T.SAAD.    Streptococcus pneumoniae NOT DETECTED NOT DETECTED Final   Streptococcus pyogenes NOT DETECTED NOT DETECTED Final   A.calcoaceticus-baumannii NOT DETECTED NOT DETECTED Final   Bacteroides fragilis NOT DETECTED NOT DETECTED Final   Enterobacterales NOT DETECTED NOT DETECTED Final   Enterobacter cloacae complex NOT DETECTED NOT DETECTED Final   Escherichia coli NOT DETECTED NOT DETECTED Final   Klebsiella aerogenes NOT DETECTED NOT DETECTED Final   Klebsiella oxytoca NOT DETECTED NOT DETECTED Final   Klebsiella pneumoniae NOT DETECTED NOT DETECTED Final   Proteus species NOT DETECTED NOT DETECTED Final   Salmonella species NOT DETECTED NOT DETECTED Final   Serratia marcescens NOT DETECTED NOT DETECTED Final   Haemophilus influenzae NOT DETECTED NOT DETECTED Final   Neisseria meningitidis NOT DETECTED NOT DETECTED Final   Pseudomonas aeruginosa NOT DETECTED NOT DETECTED Final   Stenotrophomonas maltophilia NOT DETECTED NOT DETECTED  Final   Candida albicans NOT DETECTED NOT DETECTED Final   Candida auris NOT DETECTED NOT DETECTED Final   Candida glabrata NOT DETECTED NOT DETECTED Final   Candida krusei NOT DETECTED NOT DETECTED Final   Candida parapsilosis NOT DETECTED NOT DETECTED Final   Candida tropicalis NOT DETECTED NOT DETECTED Final   Cryptococcus neoformans/gattii NOT DETECTED NOT DETECTED Final    Comment: Performed at [facility], [address] [place], [place] [address]  Urine Culture     Status: Abnormal   Collection Time: 04/24/21 11:20 AM   Specimen: In/Out Cath Urine  Result Value Ref Range Status   Specimen Description   Final    IN/OUT CATH URINE Performed at [facility], [address] [place], [place] [address]  Special Requests   Final    NONE Performed at [facility], [address] [place], [place] [address]    Culture (A)  Final    10,000 COLONIES/mL GROUP B STREP(S.AGALACTIAE)ISOLATED TESTING AGAINST S. AGALACTIAE NOT ROUTINELY PERFORMED DUE TO PREDICTABILITY OF AMP/PEN/VAN SUSCEPTIBILITY. Performed at [facility], [address] [place], [place] [address]    Report Status 04/25/2021 FINAL  Final  Resp Panel by RT-PCR (Flu A&B, Covid) Nasopharyngeal Swab     Status: None   Collection Time: 04/24/21  1:15 PM   Specimen: Nasopharyngeal Swab; Nasopharyngeal(NP) swabs in vial transport medium  Result Value Ref Range Status   SARS Coronavirus 2 by RT PCR NEGATIVE NEGATIVE Final    Comment: (NOTE) SARS-CoV-2 target nucleic acids are NOT DETECTED.  The SARS-CoV-2 RNA is generally detectable in upper respiratory specimens during the acute phase of infection. The lowest concentration of SARS-CoV-2 viral copies this assay can detect is 138 copies/mL. A negative result does not preclude SARS-Cov-2 infection and should not be used as the sole basis for treatment or other patient management decisions. A negative result may occur with  improper specimen collection/handling,  submission of specimen other than nasopharyngeal swab, presence of viral mutation(s) within the areas targeted by this assay, and inadequate number of viral copies(<138 copies/mL). A negative result must be combined with clinical observations, patient history, and epidemiological information. The expected result is Negative.  Fact Sheet for Patients:  BloggerCourse.com  Fact Sheet for Healthcare Providers:  SeriousBroker.it  This test is no t yet approved or cleared by the [place] FDA and  has been authorized for detection and/or diagnosis of SARS-CoV-2 by FDA under an Emergency Use Authorization (EUA). This EUA will remain  in effect (meaning this test can be used) for the duration of the COVID-19 declaration under Section 564(b)(1) of the Act, 21 U.S.C.section 360bbb-3(b)(1), unless the authorization is terminated  or revoked sooner.       Influenza A by PCR NEGATIVE NEGATIVE Final   Influenza B by PCR NEGATIVE NEGATIVE Final    Comment: (NOTE) The Xpert Xpress SARS-CoV-2/FLU/RSV plus assay is intended as an aid in the diagnosis of influenza from Nasopharyngeal swab specimens and should not be used as a sole basis for treatment. Nasal washings and aspirates are unacceptable for Xpert Xpress SARS-CoV-2/FLU/RSV testing.  Fact Sheet for Patients: BloggerCourse.com  Fact Sheet for Healthcare Providers: SeriousBroker.it  This test is not yet approved or cleared by the [place] FDA and has been authorized for detection and/or diagnosis of SARS-CoV-2 by FDA under an Emergency Use Authorization (EUA). This EUA will remain in effect (meaning this test can be used) for the duration of the COVID-19 declaration under Section 564(b)(1) of the Act, 21 U.S.C. section 360bbb-3(b)(1), unless the authorization is terminated or revoked.  Performed at [facility], [address], [place], [place] [address]   Culture, blood (Routine X 2) w Reflex to ID Panel     Status: None   Collection Time: 04/25/21  7:26 AM   Specimen: BLOOD RIGHT ARM  Result Value Ref Range Status   Specimen Description BLOOD RIGHT ARM  Final   Special Requests   Final    BOTTLES DRAWN AEROBIC AND ANAEROBIC Blood Culture adequate volume   Culture   Final    NO GROWTH 5 DAYS Performed at [facility], [address], [place], [place] [address]    Report Status 04/30/2021 FINAL  Final  Culture, blood (Routine X 2) w  Reflex to ID Panel     Status: None   Collection Time: 04/25/21  7:32 AM   Specimen: BLOOD RIGHT ARM  Result Value Ref Range Status   Specimen Description BLOOD RIGHT ARM  Final   Special Requests   Final    BOTTLES DRAWN AEROBIC AND ANAEROBIC Blood Culture adequate volume   Culture   Final    NO GROWTH 5 DAYS Performed at [facility], [address] [place], [place] [address]    Report Status 04/30/2021 FINAL  Final  MRSA Next Gen by PCR, Nasal     Status: None   Collection Time: 04/25/21  8:14 PM   Specimen: Nasal Mucosa; Nasal Swab  Result Value Ref Range Status   MRSA by PCR Next Gen NOT DETECTED NOT DETECTED Final    Comment: (NOTE) The GeneXpert MRSA Assay (FDA approved for NASAL specimens only), is one component of a comprehensive MRSA colonization surveillance program. It is not intended to diagnose MRSA infection nor to guide or monitor treatment for MRSA infections. Test performance is not FDA approved in patients less than 11 years old. Performed at [facility], [address] [place], [place] [address]     Procedures and diagnostic studies:  PERIPHERAL VASCULAR CATHETERIZATION  Result Date: 04/30/2021 DATE OF SERVICE: 04/30/2021  PATIENT:  Todd Zhang  75 y.o. 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 Lenell Antu, MD  ASSISTANT: none  ANESTHESIA:   local and IV sedation  ESTIMATED BLOOD LOSS: minimal  LOCAL MEDICATIONS USED:  LIDOCAINE  COUNTS: confirmed correct.  PATIENT DISPOSITION:  PACU - hemodynamically stable.  Delay start of Pharmacological VTE agent (>24hrs) due to surgical blood loss or risk of bleeding: no  INDICATION FOR PROCEDURE: Todd Zhang is a 75 y.o. male with ulceration and gangrene of the left great toe and non-invasive evidence of severe peripheral arterial disease. After careful discussion of risks, benefits, and alternatives the patient was offered angiogram. The patient understood and wished to proceed.  OPERATIVE FINDINGS:  Terminal aorta and iliac arteries: Heavily calcified / stenotic terminal aorta (>70% stenosis) / common iliac arteries (>75% stenosis) Internal iliac arteries patent bilaterally External iliac arteries patient bilaterally  Right lower extremity: Common femoral artery: severe plaque. Sheath is occlusive of flow, making visualization of the remaining arteries limited Profunda femoris artery: Patent Superficial femoral artery: occluded Popliteal artery: reconstitutes below the knee Anterior tibial artery: patent at its origin, but occludes in the calf Tibioperoneal trunk: patent Peroneal artery: patent Posterior tibial artery: patent, dominant flow to the foot Pedal circulation: not evaluated  Left lower extremity: Common femoral artery: Significant calcified plaque Profunda femoris artery: widely patent Superficial femoral artery: Heavily calcified and stenotic. Short segment chronic total occlusion at Hunter's canal. Popliteal artery: heavily calcified and stenotic (greatest stenosis >95% behind the knee). Terminal popliteal artery / proximal TP trunk heavily calcified and stenosed (90%). Anterior tibial artery:  occluded Tibioperoneal trunk: patent, but calcified Peroneal artery: patent to the ankle Posterior tibial artery: dominant. Flow to the foot. Fills the pedal arch Pedal circulation: intact  DESCRIPTION OF PROCEDURE: After identification of the patient in the pre-operative holding area, the patient was transferred to the operating room. The patient was  positioned supine on the operating room table. Anesthesia was induced. The groins was prepped and draped in standard fashion. A surgical pause was performed confirming correct patient, procedure, and operative location.  The right groin was anesthetized with subcutaneous injection of 1% lidocaine. Using ultrasound guidance, the right common femoral artery was accessed with micropuncture technique. Fluoroscopy was used to confirm cannulation over the femoral head. The 43F sheath was upsized to 58F.  A Benson wire was advanced into the distal aorta. Over the wire an omni flush catheter was advanced to the level of L2. Aortogram was performed - see above for details. Bilateral lower extremity runoff angiography was performed - see above for details.  Conscious sedation was administered with the use of IV fentanyl and midazolam under continuous physician and nurse monitoring.  Heart rate, blood pressure, and oxygen saturation were continuously monitored.  Total sedation time was 36 minutes  Upon completion of the case instrument and sharps counts were confirmed correct. The patient was transferred to the PACU in good condition. I was present for all portions of the procedure.  PLAN: Complex disease pattern.  His terminal aorta and iliac arteries are heavily diseased.  He is not a physiologic candidate for open aortoiliac surgery.  The extent of disease made me leery about stenting.  I think the simplest option for limb salvage would be a common femoral endarterectomy and a femoral to TP trunk bypass.  Will discuss with Dr. Chestine Spore.  Continue best medical therapy for PAD.   Rande Brunt. Lenell Antu, MD [facility] Phone Number: [phone] 04/30/2021 8:21 AM   ECHO TEE  Result Date: 05/01/2021    TRANSESOPHOGEAL ECHO REPORT   Patient Name:   Todd Zhang Date of Exam: 05/01/2021 Medical Rec #:  [id number]     Height:       72.0 in Accession #:    [id number]    Weight:       221.3 lb Date of Birth:  12-Jul-1946    BSA:          2.224 m Patient Age:    74 years      BP:           125/46 mmHg Patient Gender: M             HR:           96 bpm. Exam Location:  Inpatient Procedure: Transesophageal Echo, Cardiac Doppler and Color Doppler Indications:     Bacteremia  History:         Patient has prior history of Echocardiogram examinations, most                  recent 04/26/2021. COPD, Arrythmias:Atrial Fibrillation; Risk                  Factors:Hypertension, Diabetes and Dyslipidemia.  Sonographer:     Ross Ludwig RDCS (AE) Referring Phys:  [id number] HAO MENG Diagnosing Phys: Chilton Si MD PROCEDURE: After discussion of the risks and benefits of a TEE, an informed consent was obtained from the patient. The transesophogeal probe was passed without difficulty through the esophogus of the patient. Local oropharyngeal anesthetic was provided with Cetacaine. Sedation performed by different physician. The patient was monitored while under deep sedation. Anesthestetic sedation was provided intravenously by Anesthesiology: 182.  of Propofol,  of Lidocaine. Image quality was adequate. The patient's vital signs; including heart rate, blood pressure, and oxygen saturation; remained stable throughout the procedure.  The patient developed no complications during the procedure. IMPRESSIONS  1. Left ventricular ejection fraction, by estimation, is 60 to 65%. The left ventricle has normal function. The left ventricle has no regional wall motion abnormalities.  2. Right ventricular systolic function is normal. The right ventricular size is normal.  3. No left  atrial/left atrial appendage thrombus was detected.  4. The mitral valve is normal in structure. Mild mitral valve regurgitation. No evidence of mitral stenosis.  5. 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 Functionally bicuspid. The aortic valve is calcified. There is moderate calcification of the aortic valve. There is moderate thickening of the aortic valve. Aortic valve regurgitation is not visualized. Mild to moderate aortic valve sclerosis/calcification is present, without any evidence of aortic stenosis. Aortic valve mean gradient measures 8.0 mmHg. Aortic valve Vmax measures 1.96 m/s.  6. 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  7. The inferior vena cava is normal in size with greater than 50% respiratory variability, suggesting right atrial pressure of 3 mmHg. Conclusion(s)/Recommendation(s): No evidence of vegetation/infective endocarditis on this transesophageal echocardiogram. FINDINGS  Left Ventricle: Left ventricular ejection fraction, by estimation, is 60 to 65%. The left ventricle has normal function. The left ventricle has no regional wall motion abnormalities. The left ventricular internal cavity size was normal in size. There is  no left ventricular hypertrophy. Right Ventricle: The right ventricular size is normal. No increase in right ventricular wall thickness. Right ventricular systolic function is normal. Left Atrium: Left atrial size was normal in size. No left atrial/left atrial appendage thrombus was detected. Right Atrium: Right atrial size was normal in size. Pericardium: There is no evidence of pericardial effusion. Mitral Valve: The mitral valve is normal in structure. Mild mitral valve regurgitation. No evidence of mitral valve stenosis. Tricuspid Valve: The tricuspid valve is normal in structure. Tricuspid valve regurgitation is not demonstrated. No evidence of tricuspid stenosis. Aortic Valve: Partial fusion of the right and  non-coronary cusps. Functionally bicuspid. The aortic valve is calcified. There is moderate calcification of the aortic valve. There is moderate thickening of the aortic valve. Aortic valve regurgitation is not visualized. Mild to moderate aortic valve sclerosis/calcification is present, without any evidence of aortic stenosis. Aortic valve mean gradient measures 8.0 mmHg. Aortic valve peak gradient measures 15.4 mmHg. Pulmonic Valve: The pulmonic valve was normal in structure. Pulmonic valve regurgitation is not visualized. No evidence of pulmonic stenosis. Aorta: The aortic root is normal in size and structure. There is moderate (Grade III) layered plaque involving the descending aorta. Venous: The inferior vena cava is normal in size with greater than 50% respiratory variability, suggesting right atrial pressure of 3 mmHg. IAS/Shunts: No atrial level shunt detected by color flow Doppler.  AORTIC VALVE AV Vmax:      196.00 cm/s AV Vmean:     133.000 cm/s AV VTI:       0.351 m AV Peak Grad: 15.4 mmHg AV Mean Grad: 8.0 mmHg Chilton Si MD Electronically signed by Chilton Si MD Signature Date/Time: 05/01/2021/12:51:27 PM    Final                LOS: 7 days   Hartford Maulden  [facility]   Pager on [email or web address]. If 7PM-7AM, please contact night-coverage at [email or web address]     05/01/2021, 1:40 PM

## 2021-05-01 NOTE — Progress Notes (Signed)
  Echocardiogram Echocardiogram Transesophageal has been performed.  Gerda Diss 05/01/2021, 10:41 AM

## 2021-05-01 NOTE — Progress Notes (Signed)
  Progress Note    05/01/2021 8:04 AM 1 Day Post-Op  Subjective:  no complaints except that he is hungry and thursty   Vitals:   05/01/21 0405 05/01/21 0753  BP: 121/61 138/77  Pulse: 76 92  Resp: (!) 21 20  Temp: 98.3 F (36.8 C) 98.3 F (36.8 C)  SpO2: 94% 95%   Physical Exam: Cardiac:  regular Lungs:  non labored Incisions:  right common femoral access site c/d/I no swelling or hematoma Extremities:  ulceration and gangrene of left great toe unchanged Abdomen:  distended, soft, non tender Neurologic: alert and oriented  CBC    Component Value Date/Time   WBC 10.0 04/30/2021 0250   RBC 3.54 (L) 04/30/2021 0250   HGB 10.8 (L) 04/30/2021 0250   HGB 13.5 01/28/2021 1142   HCT 31.5 (L) 04/30/2021 0250   HCT 40.3 01/28/2021 1142   PLT 308 04/30/2021 0250   PLT 185 01/28/2021 1142   MCV 89.0 04/30/2021 0250   MCV 90 01/28/2021 1142   MCH 30.5 04/30/2021 0250   MCHC 34.3 04/30/2021 0250   RDW 12.7 04/30/2021 0250   RDW 12.7 01/28/2021 1142   LYMPHSABS 1.7 04/30/2021 0250   LYMPHSABS 2.6 01/28/2021 1142   MONOABS 0.6 04/30/2021 0250   EOSABS 0.2 04/30/2021 0250   EOSABS 0.1 01/28/2021 1142   BASOSABS 0.0 04/30/2021 0250   BASOSABS 0.0 01/28/2021 1142    BMET    Component Value Date/Time   NA 136 05/01/2021 0603   NA 137 01/28/2021 1142   K 3.7 05/01/2021 0603   CL 105 05/01/2021 0603   CO2 24 05/01/2021 0603   GLUCOSE 140 (H) 05/01/2021 0603   BUN 13 05/01/2021 0603   BUN 30 (H) 01/28/2021 1142   CREATININE 1.10 05/01/2021 0603   CALCIUM 8.1 (L) 05/01/2021 0603   GFRNONAA >60 05/01/2021 0603   GFRAA 44 (L) 10/17/2020 1434    INR    Component Value Date/Time   INR 1.4 (H) 04/24/2021 1041     Intake/Output Summary (Last 24 hours) at 05/01/2021 0804 Last data filed at 05/01/2021 0753 Gross per 24 hour  Intake 2992.64 ml  Output 550 ml  Net 2442.64 ml     Assessment/Plan:  75 y.o. male is s/p diagnostic arteriogram 1 Day Post-Op   No  endovascular options for revascularization with complex disease R CF artery access site c/d/I without swelling or hematoma Plan is for OR on Monday with Dr. Chestine Spore for CF endart, CF to TPT bypass and possible iliac stenting Vein mapping pending Allergy to ASA Continue Statin   Graceann Congress, PA-C [facility] [phone] 05/01/2021 8:04 AM

## 2021-05-01 NOTE — CV Procedure (Signed)
Brief TEE Note  LVEF 60-65% Partial fusion of the non-coronary cusp and left cusp of the aortic valve Aortic valve sclerosis without stenosis Mild TR Atherosclerosis of the descending aorta No LA/LAA thrombus or masses  For additional details see full report.  Todd Zhang C. Duke Salvia, MD, [facility] 05/01/2021 10:12 AM

## 2021-05-01 NOTE — Progress Notes (Signed)
[facility]    Date of Admission:  04/24/2021   Total days of antibiotics 8           ID: JAVAD SALVA is a 75 y.o. male with  difu osteomyelitis with pvd and group b strep bacteremia Active Problems:   Diabetic foot infection (HCC)   Sepsis due to undetermined organism (HCC)   Atrial fibrillation with RVR (HCC)   CKD (chronic kidney disease) stage 3, GFR 30-59 ml/min (HCC)   Cellulitis and abscess of foot   Acute osteomyelitis of toe of left foot (HCC)   Sepsis due to group B Streptococcus (HCC)   Cellulitis of great toe, left    Subjective: Afebrile, underwent angiogram/vascular catherization yesterday and TEE this morning did not see any vegetations.still some drainage from left great toe  Medications:   atorvastatin  40 mg Oral Daily   diltiazem  240 mg Oral Daily   gabapentin  300 mg Oral TID   guaiFENesin  1,200 mg Oral BID   heparin  5,000 Units Subcutaneous Q8H   insulin aspart  0-5 Units Subcutaneous QHS   insulin aspart  0-9 Units Subcutaneous TID WC   loratadine  10 mg Oral Daily   metoprolol succinate  100 mg Oral Daily   multivitamin with minerals  1 tablet Oral Daily   pantoprazole  40 mg Oral Daily   sodium chloride flush  3 mL Intravenous Q12H   tamsulosin  0.4 mg Oral Daily   vitamin B-12  500 mcg Oral Daily    Objective: Vital signs in last 24 hours: Temp:  [97 F (36.1 C)-98.4 F (36.9 C)] 98.1 F (36.7 C) (08/19 1517) Pulse Rate:  [76-104] 91 (08/19 1517) Resp:  [12-21] 18 (08/19 1517) BP: (114-166)/(46-85) 132/60 (08/19 1517) SpO2:  [92 %-100 %] 95 % (08/19 1517) Weight:  [100.4 kg] 100.4 kg (08/19 0834) Physical Exam  Constitutional: He is oriented to person, place, and time. He appears well-developed and well-nourished. No distress.  HENT:  Mouth/Throat: Oropharynx is clear and moist. No oropharyngeal exudate.  Cardiovascular: Normal rate, regular rhythm and normal heart sounds. Exam reveals no gallop and no  friction rub.  No murmur heard.  Pulmonary/Chest: Effort normal and breath sounds normal. No respiratory distress. He has no wheezes.  Abdominal: Soft. Bowel sounds are normal. He exhibits no distension. There is no tenderness.  Lymphadenopathy:  He has no cervical adenopathy.  Ext: left great toe drainage and swelling, improved erythema Neurological: He is alert and oriented to person, place, and time.  Skin: Skin is warm and dry. No rash noted. No erythema.  Psychiatric: He has a normal mood and affect. His behavior is normal.    Lab Results Recent Labs    04/29/21 0630 04/30/21 0250 05/01/21 0603  WBC 11.0* 10.0  --   HGB 10.9* 10.8*  --   HCT 31.4* 31.5*  --   NA 132* 134* 136  K 3.6 3.5 3.7  CL 101 100 105  CO2 24 25 24   BUN 16 15 13   CREATININE 1.28* 1.30* 1.10   Liver Panel Recent Labs    04/30/21 0250 05/01/21 0603  PROT 5.7* 5.5*  ALBUMIN 2.0* 1.9*  AST 89* 49*  ALT 68* 56*  ALKPHOS 78 81  BILITOT 0.4 0.4   Sedimentation Rate No results for input(s): ESRSEDRATE in the last 72 hours. C-Reactive Protein No results for input(s): CRP in the last 72 hours.  Microbiology: Repeat blood cx  ngtd Studies/Results: PERIPHERAL VASCULAR CATHETERIZATION  Result Date: 04/30/2021 DATE OF SERVICE: 04/30/2021  PATIENT:  Babs Sciaraaul C Aoun  75 y.o. 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 Lenell AntuHawken, MD  ASSISTANT: none  ANESTHESIA:   local and IV sedation  ESTIMATED BLOOD LOSS: minimal  LOCAL MEDICATIONS USED:  LIDOCAINE  COUNTS: confirmed correct.  PATIENT DISPOSITION:  PACU - hemodynamically stable.  Delay start of Pharmacological VTE agent (>24hrs) due to surgical blood  loss or risk of bleeding: no  INDICATION FOR PROCEDURE: Babs Sciaraaul C Mahrt is a 75 y.o. male with ulceration and gangrene of the left great toe and non-invasive evidence of severe peripheral arterial disease. After careful discussion of risks, benefits, and alternatives the patient was offered angiogram. The patient understood and wished to proceed.  OPERATIVE FINDINGS:  Terminal aorta and iliac arteries: Heavily calcified / stenotic terminal aorta (>70% stenosis) / common iliac arteries (>75% stenosis) Internal iliac arteries patent bilaterally External iliac arteries patient bilaterally  Right lower extremity: Common femoral artery: severe plaque. Sheath is occlusive of flow, making visualization of the remaining arteries limited Profunda femoris artery: Patent Superficial femoral artery: occluded Popliteal artery: reconstitutes below the knee Anterior tibial artery: patent at its origin, but occludes in the calf Tibioperoneal trunk: patent Peroneal artery: patent Posterior tibial artery: patent, dominant flow to the foot Pedal circulation: not evaluated  Left lower extremity: Common femoral artery: Significant calcified plaque Profunda femoris artery: widely patent Superficial femoral artery: Heavily calcified and stenotic. Short segment chronic total occlusion at Hunter's canal. Popliteal artery: heavily calcified and stenotic (greatest stenosis >95% behind the knee). Terminal popliteal artery / proximal TP trunk heavily calcified and stenosed (90%). Anterior tibial artery: occluded Tibioperoneal trunk: patent, but calcified Peroneal artery: patent to the ankle Posterior tibial artery: dominant. Flow to the foot. Fills the pedal arch Pedal circulation: intact  DESCRIPTION OF PROCEDURE: After identification of the patient in the pre-operative holding area, the patient was transferred to the operating room. The patient was positioned supine on the operating room table. Anesthesia was induced. The groins was prepped and  draped in standard fashion. A surgical pause was performed confirming correct patient, procedure, and operative location.  The right groin was anesthetized with subcutaneous injection of 1% lidocaine. Using ultrasound guidance, the right common femoral artery was accessed with micropuncture technique. Fluoroscopy was used to confirm cannulation over the femoral head. The 38F sheath was upsized to 4F.  A Benson wire was advanced into the distal aorta. Over the wire an omni flush catheter was advanced to the level of L2. Aortogram was performed - see above for details. Bilateral lower extremity runoff angiography was performed - see above for details.  Conscious sedation was administered with the use of IV fentanyl and midazolam under continuous physician and nurse monitoring.  Heart rate, blood pressure, and oxygen saturation were continuously monitored.  Total sedation time was 36 minutes  Upon completion of the case instrument and sharps counts were confirmed correct. The patient was transferred to the PACU in good condition. I was present for all portions of the procedure.  PLAN: Complex disease pattern.  His terminal aorta and iliac arteries are heavily diseased.  He is not a physiologic candidate for open  aortoiliac surgery.  The extent of disease made me leery about stenting.  I think the simplest option for limb salvage would be a common femoral endarterectomy and a femoral to TP trunk bypass.  Will discuss with Dr. Chestine Spore.  Continue best medical therapy for PAD.  Rande Brunt. Lenell Antu, MD [facility] Phone Number: [phone] 04/30/2021 8:21 AM   ECHO TEE  Result Date: 05/01/2021    TRANSESOPHOGEAL ECHO REPORT   Patient Name:   Todd Zhang Date of Exam: 05/01/2021 Medical Rec #:  [id number]     Height:       72.0 in Accession #:    [id number]    Weight:       221.3 lb Date of Birth:  31-Dec-1945    BSA:          2.224 m Patient Age:    74 years      BP:           125/46  mmHg Patient Gender: M             HR:           96 bpm. Exam Location:  Inpatient Procedure: Transesophageal Echo, Cardiac Doppler and Color Doppler Indications:     Bacteremia  History:         Patient has prior history of Echocardiogram examinations, most                  recent 04/26/2021. COPD, Arrythmias:Atrial Fibrillation; Risk                  Factors:Hypertension, Diabetes and Dyslipidemia.  Sonographer:     Ross Ludwig RDCS (AE) Referring Phys:  [id number] HAO MENG Diagnosing Phys: Chilton Si MD PROCEDURE: After discussion of the risks and benefits of a TEE, an informed consent was obtained from the patient. The transesophogeal probe was passed without difficulty through the esophogus of the patient. Local oropharyngeal anesthetic was provided with Cetacaine. Sedation performed by different physician. The patient was monitored while under deep sedation. Anesthestetic sedation was provided intravenously by Anesthesiology: 182.57mg  of Propofol, 60mg  of Lidocaine. Image quality was adequate. The patient's vital signs; including heart rate, blood pressure, and oxygen saturation; remained stable throughout the procedure. The patient developed no complications during the procedure. IMPRESSIONS  1. Left ventricular ejection fraction, by estimation, is 60 to 65%. The left ventricle has normal function. The left ventricle has no regional wall motion abnormalities.  2. Right ventricular systolic function is normal. The right ventricular size is normal.  3. No left atrial/left atrial appendage thrombus was detected.  4. The mitral valve is normal in structure. Mild mitral valve regurgitation. No evidence of mitral stenosis.  5. 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 Functionally bicuspid. The aortic valve is calcified. There is moderate calcification of the aortic valve. There is moderate thickening of the aortic valve. Aortic valve regurgitation is not visualized. Mild to moderate aortic valve  sclerosis/calcification is present, without any evidence of aortic stenosis. Aortic valve mean gradient measures 8.0 mmHg. Aortic valve Vmax measures 1.96 m/s.  6. 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  7. The inferior vena cava is normal in size with greater than 50% respiratory variability, suggesting right atrial pressure of 3 mmHg. Conclusion(s)/Recommendation(s): No evidence of vegetation/infective endocarditis on this transesophageal echocardiogram. FINDINGS  Left Ventricle: Left ventricular ejection fraction, by estimation, is 60 to 65%. The left ventricle has normal  function. The left ventricle has no regional wall motion abnormalities. The left ventricular internal cavity size was normal in size. There is  no left ventricular hypertrophy. Right Ventricle: The right ventricular size is normal. No increase in right ventricular wall thickness. Right ventricular systolic function is normal. Left Atrium: Left atrial size was normal in size. No left atrial/left atrial appendage thrombus was detected. Right Atrium: Right atrial size was normal in size. Pericardium: There is no evidence of pericardial effusion. Mitral Valve: The mitral valve is normal in structure. Mild mitral valve regurgitation. No evidence of mitral valve stenosis. Tricuspid Valve: The tricuspid valve is normal in structure. Tricuspid valve regurgitation is not demonstrated. No evidence of tricuspid stenosis. 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 Functionally bicuspid. The aortic valve is calcified. There is moderate calcification of the aortic valve. There is moderate thickening of the aortic valve. Aortic valve regurgitation is not visualized. Mild to moderate aortic valve sclerosis/calcification is present, without any evidence of aortic stenosis. Aortic valve mean gradient measures 8.0 mmHg. Aortic valve peak gradient measures 15.4 mmHg. Pulmonic Valve: The pulmonic valve was  normal in structure. Pulmonic valve regurgitation is not visualized. No evidence of pulmonic stenosis. Aorta: The aortic root is normal in size and structure. There is moderate (Grade III) layered plaque involving the descending aorta. Venous: The inferior vena cava is normal in size with greater than 50% respiratory variability, suggesting right atrial pressure of 3 mmHg. IAS/Shunts: No atrial level shunt detected by color flow Doppler.  AORTIC VALVE AV Vmax:      196.00 cm/s AV Vmean:     133.000 cm/s AV VTI:       0.351 m AV Peak Grad: 15.4 mmHg AV Mean Grad: 8.0 mmHg Chilton Si MD Electronically signed by Chilton Si MD Signature Date/Time: 05/01/2021/12:51:27 PM    Final      Assessment/Plan: Group b strep bacteremia and left great toe dfu/osteomyelitis = continue on ceftriaxone 2gm IV daily. Has vascular intervention on Monday. We will decide from there what his abtx regimen. If he would be a SNF candidate. Concern that he would not be reliable to do infusion at home alone.   PVD = found to have complex disease pattern. Plan for femoral endarterectomy and femoral to TP trunk bypass.  We will see back on Monday, Dr Renold Don available for questions over the weekend.  [facility] Infectious Diseases Cell: [phone] Pager: [phone]  05/01/2021, 3:41 PM

## 2021-05-01 NOTE — Anesthesia Procedure Notes (Addendum)
Procedure Name: MAC Date/Time: 05/01/2021 9:52 AM Performed by: Rande Brunt, CRNA Pre-anesthesia Checklist: Patient identified, Emergency Drugs available, Suction available and Patient being monitored Patient Re-evaluated:Patient Re-evaluated prior to induction Oxygen Delivery Method: Nasal cannula Preoxygenation: Pre-oxygenation with 100% oxygen Induction Type: IV induction Airway Equipment and Method: Bite block Placement Confirmation: positive ETCO2 and CO2 detector Dental Injury: Teeth and Oropharynx as per pre-operative assessment

## 2021-05-01 NOTE — Transfer of Care (Signed)
Immediate Anesthesia Transfer of Care Note  Patient: Todd Zhang  Procedure(s) Performed: TRANSESOPHAGEAL ECHOCARDIOGRAM (TEE)  Patient Location: Endoscopy Unit  Anesthesia Type:MAC  Level of Consciousness: awake, alert  and drowsy  Airway & Oxygen Therapy: Patient Spontanous Breathing and Patient connected to nasal cannula oxygen  Post-op Assessment: Report given to RN, Post -op Vital signs reviewed and stable and Patient moving all extremities X 4  Post vital signs: Reviewed and stable  Last Vitals:  Vitals Value Taken Time  BP    Temp    Pulse 110 05/01/21 1016  Resp    SpO2 97 % 05/01/21 1016  Vitals shown include unvalidated device data.  Last Pain:  Vitals:   05/01/21 0834  TempSrc: Temporal  PainSc: 8       Patients Stated Pain Goal: 0 (03/54/65 6812)  Complications: No notable events documented.

## 2021-05-01 NOTE — Care Management Important Message (Signed)
Important Message  Patient Details  Name: Todd Zhang MRN: [id number] Date of Birth: [birth year]   Medicare Important Message Given:  Yes     Renie Ora 05/01/2021, 9:03 AM

## 2021-05-01 NOTE — Anesthesia Postprocedure Evaluation (Signed)
Anesthesia Post Note  Patient: Todd Zhang  Procedure(s) Performed: TRANSESOPHAGEAL ECHOCARDIOGRAM (TEE)     Patient location during evaluation: Endoscopy Anesthesia Type: MAC Level of consciousness: awake and alert Pain management: pain level controlled Vital Signs Assessment: post-procedure vital signs reviewed and stable Respiratory status: spontaneous breathing, nonlabored ventilation and respiratory function stable Cardiovascular status: blood pressure returned to baseline and stable Postop Assessment: no apparent nausea or vomiting Anesthetic complications: no   No notable events documented.  Last Vitals:  Vitals:   05/01/21 1016 05/01/21 1025  BP: 136/73 (!) 125/46  Pulse: (!) 104 91  Resp:  20  Temp: (!) 36.1 C   SpO2: 97% 92%    Last Pain:  Vitals:   05/01/21 1025  TempSrc:   PainSc: 0-No pain                 Lynda Rainwater

## 2021-05-01 NOTE — Anesthesia Preprocedure Evaluation (Signed)
Anesthesia Evaluation  Patient identified by MRN, date of birth, ID band Patient awake    Reviewed: Allergy & Precautions, NPO status , Patient's Chart, lab work & pertinent test results  Airway Mallampati: II  TM Distance: >3 FB Neck ROM: Full    Dental no notable dental hx.    Pulmonary COPD, former smoker,    Pulmonary exam normal breath sounds clear to auscultation       Cardiovascular hypertension, Pt. on medications + Peripheral Vascular Disease  Normal cardiovascular exam Rhythm:Regular Rate:Normal     Neuro/Psych Anxiety negative neurological ROS  negative psych ROS   GI/Hepatic Neg liver ROS, GERD  ,  Endo/Other  negative endocrine ROSdiabetes, Type 2  Renal/GU Renal InsufficiencyRenal disease  negative genitourinary   Musculoskeletal  (+) Arthritis , Osteoarthritis,    Abdominal (+) + obese,   Peds negative pediatric ROS (+)  Hematology negative hematology ROS (+)   Anesthesia Other Findings Bacteremia  Reproductive/Obstetrics negative OB ROS                             Anesthesia Physical Anesthesia Plan  ASA: 4  Anesthesia Plan: MAC   Post-op Pain Management:    Induction: Intravenous  PONV Risk Score and Plan: 2 and Ondansetron, Midazolam and Treatment may vary due to age or medical condition  Airway Management Planned: Nasal Cannula  Additional Equipment:   Intra-op Plan:   Post-operative Plan:   Informed Consent: I have reviewed the patients History and Physical, chart, labs and discussed the procedure including the risks, benefits and alternatives for the proposed anesthesia with the patient or authorized representative who has indicated his/her understanding and acceptance.     Dental advisory given  Plan Discussed with: CRNA  Anesthesia Plan Comments:         Anesthesia Quick Evaluation

## 2021-05-02 ENCOUNTER — Inpatient Hospital Stay (HOSPITAL_COMMUNITY): Payer: Medicare Other

## 2021-05-02 DIAGNOSIS — L02619 Cutaneous abscess of unspecified foot: Secondary | ICD-10-CM | POA: Diagnosis not present

## 2021-05-02 DIAGNOSIS — Z01810 Encounter for preprocedural cardiovascular examination: Secondary | ICD-10-CM | POA: Diagnosis not present

## 2021-05-02 DIAGNOSIS — M86172 Other acute osteomyelitis, left ankle and foot: Secondary | ICD-10-CM | POA: Diagnosis not present

## 2021-05-02 DIAGNOSIS — I4891 Unspecified atrial fibrillation: Secondary | ICD-10-CM | POA: Diagnosis not present

## 2021-05-02 DIAGNOSIS — L03119 Cellulitis of unspecified part of limb: Secondary | ICD-10-CM | POA: Diagnosis not present

## 2021-05-02 LAB — GLUCOSE, CAPILLARY
Glucose-Capillary: 132 mg/dL — ABNORMAL HIGH (ref 70–99)
Glucose-Capillary: 161 mg/dL — ABNORMAL HIGH (ref 70–99)
Glucose-Capillary: 138 mg/dL — ABNORMAL HIGH (ref 70–99)
Glucose-Capillary: 145 mg/dL — ABNORMAL HIGH (ref 70–99)

## 2021-05-02 MED ORDER — ALPRAZOLAM 0.5 MG PO TABS
1.0000 mg | ORAL_TABLET | Freq: Every evening | ORAL | Status: AC | PRN
  Administered 2021-05-02: 1 mg via ORAL
  Filled 2021-05-02: qty 2

## 2021-05-02 NOTE — Progress Notes (Signed)
Progress Note    Todd Zhang  NGE:[id number] DOB: 05/17/1946  DOA: 04/24/2021 PCP: [name], FNP      Brief Narrative:    Medical records reviewed and are as summarized below:  Todd Zhang is a 75 y.o. male medical history of diabetes mellitus, hypertension, hyperlipidemia, COPD, anxiety, GERD, recent visit to [facility] on 04/21/2020 for confusion, who presented to the hospital with 1 day history of left flank pain and 2-week history of left foot pain.  He also complained of multiple falls secondary to unsteady gait.      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  (Obesity)   Severe sepsis secondary to acute osteomyelitis left great toe, diabetic foot infection, group B strep bacteremia: S/p aortogram which showed complex PVD.  Plan for vascular surgery on 05/04/2021.  Follow-up with vascular surgeon. 2D echo did not show any evidence of vegetation, EF was estimated at 60 to 65%.  No evidence of vegetation orcardiac thrombus on TEE that was done on 05/01/2021.  Atrial fibrillation with RVR: Continue metoprolol and Cardizem Xarelto on hold because of plans for vascular surgery on 05/04/2021.  Type 2 diabetes mellitus with hyperglycemia: Hemoglobin A1c 8.3.  Continue NovoLog as needed for hyperglycemia.  Hypoxia: Suspect patient may have chronic hypoxic respiratory failure from COPD.  Check pulse oximetry with ambulation.    Hyponatremia: Improved   Other comorbidities include CKD stage IIIb, hypertension, hyperlipidemia  Benazepril and metformin have been held.    Diet Order              Diet Carb Modified Fluid consistency: Thin; Room service appropriate? Yes with Assist  Diet effective now                      Consultants: Infectious disease Vascular surgeon  Procedures: None    Medications:    atorvastatin  40 mg Oral Daily   diltiazem  240 mg Oral Daily   gabapentin  300 mg Oral TID   guaiFENesin  1,200 mg Oral BID   heparin  5,000 Units Subcutaneous Q8H   insulin aspart  0-5 Units Subcutaneous QHS   insulin aspart  0-9 Units Subcutaneous TID WC   loratadine  10 mg Oral Daily   metoprolol succinate  100 mg Oral Daily   multivitamin with minerals  1 tablet Oral Daily   pantoprazole  40 mg Oral Daily   sodium chloride flush  3 mL Intravenous Q12H   tamsulosin  0.4 mg Oral Daily   vitamin B-12  500 mcg Oral Daily   Continuous Infusions:  sodium chloride 100 mL/hr at 05/02/21 0055   cefTRIAXone (ROCEPHIN)  IV 2 g (05/01/21 1131)     Anti-infectives (From admission, onward)    Start     Dose/Rate Route Frequency Ordered Stop   04/28/21 1200  cefTRIAXone (ROCEPHIN) 2 g in sodium chloride 0.9 % 100 mL IVPB       Note to Pharmacy: Patient tolerated cephalexin in the past   2 g 200 mL/hr over  30 Minutes Intravenous Every 24 hours 04/28/21 0958     04/27/21 1200  meropenem (MERREM) 1 g in sodium chloride 0.9 % 100 mL IVPB  Status:  Discontinued        1 g 200 mL/hr over 30 Minutes Intravenous Every 8 hours 04/27/21 0845 04/28/21 0957   04/25/21 1500  vancomycin (VANCOREADY) IVPB 1250 mg/250 mL  Status:  Discontinued        1,250 mg 166.7 mL/hr over 90 Minutes Intravenous Every 24 hours 04/24/21 1348 04/27/21 0845   04/24/21 2000  meropenem (MERREM) 500 mg in sodium chloride 0.9 % 100 mL IVPB  Status:  Discontinued        500 mg 200 mL/hr over 30 Minutes Intravenous Every 8 hours 04/24/21 1337 04/27/21 0845   04/24/21 1430  vancomycin (VANCOREADY) IVPB 2000 mg/400 mL        2,000 mg 200 mL/hr over 120 Minutes Intravenous  Once 04/24/21 1345  04/24/21 1819   04/24/21 1115  ciprofloxacin (CIPRO) IVPB 400 mg        400 mg 200 mL/hr over 60 Minutes Intravenous  Once 04/24/21 1100 04/24/21 1340   04/24/21 1115  clindamycin (CLEOCIN) IVPB 600 mg        600 mg 100 mL/hr over 30 Minutes Intravenous  Once 04/24/21 1100 04/24/21 1226              Family Communication/Anticipated D/C date and plan/Code Status   DVT prophylaxis: heparin injection 5,000 Units Start: 04/30/21 2200 SCD's Start: 04/30/21 1433     Code Status: Full Code  Family Communication: 2 sisters at the bedside Disposition Plan:    Status is: Inpatient  Remains inpatient appropriate because:IV treatments appropriate due to intensity of illness or inability to take PO and Inpatient level of care appropriate due to severity of illness  Dispo: The patient is from: Home              Anticipated d/c is to: Home              Patient currently is not medically stable to d/c.   Difficult to place patient No           Subjective:   Interval events noted.  He still has pain in the left foot.  No shortness of breath or chest pain.  Oxygen saturation dropped to upper 80s on room air overnight.  Objective:    Vitals:   05/01/21 1950 05/01/21 2343 05/02/21 0344 05/02/21 0809  BP: (!) 144/64 (!) 155/71 (!) 144/77 (!) 149/68  Pulse: 83 83 82   Resp: 20 (!) 23 20 20   Temp: 98 F (36.7 C) 98.6 F (37 C) 98.2 F (36.8 C) 98.1 F (36.7 C)  TempSrc: Oral Oral Oral Oral  SpO2: 94% 90% 90% 96%  Weight:      Height:       No data found.   Intake/Output Summary (Last 24 hours) at 05/02/2021 1117 Last data filed at 05/02/2021 0359 Gross per 24 hour  Intake 1590.17 ml  Output 250 ml  Net 1340.17 ml   Filed Weights   04/25/21 2002 04/30/21 1432 05/01/21 0834  Weight: 102.6 kg 100.4 kg 100.4 kg    Exam:   GEN: NAD SKIN: Warm and dry EYES: EOMI ENT: MMM CV: Irregular rate and rhythm PULM: CTA B ABD: soft, obese, NT, +BS CNS: AAO x 3,  non focal EXT: Erythema of the proximal aspect of the left foot and left big toe.  Wound on the left big toe.          Data Reviewed:   I have personally reviewed following labs and imaging studies:  Labs: Labs show the following:   Basic Metabolic Panel: Recent Labs  Lab 04/26/21 0701 04/27/21 0403 04/28/21 0749 04/29/21 0630 04/30/21 0250 05/01/21 0603  NA 131* 131* 134* 132* 134* 136  K 3.8 3.8 3.8 3.6 3.5 3.7  CL 103 100 100 101 100 105  CO2 GLUCOSE 194* 169* 199* 193* 138* 140*  BUN CREATININE 1.55* 1.31* 1.17 1.28* 1.30* 1.10  CALCIUM 7.9* 7.7* 8.4* 8.3* 8.6* 8.1*  MG 1.6* 1.8 1.6* 1.6*  --   --   PHOS  --   --  2.4* 2.7  --   --    GFR Estimated Creatinine Clearance: 71.2 mL/min (by C-G formula based on SCr of 1.1 mg/dL). Liver Function Tests: Recent Labs  Lab 04/26/21 0701 04/29/21 0630 04/30/21 0250 05/01/21 0603  AST 36 64* 89* 49*  ALT 29 51* 68* 56*  ALKPHOS 80 91 78 81  BILITOT 0.8 0.7 0.4 0.4  PROT 6.4* 5.9* 5.7* 5.5*  ALBUMIN 2.5* 2.1* 2.0* 1.9*   No results for input(s): LIPASE, AMYLASE in the last 168 hours. No results for input(s): AMMONIA in the last 168 hours. Coagulation profile No results for input(s): INR, PROTIME in the last 168 hours.   CBC: Recent Labs  Lab 04/27/21 0403 04/28/21 0749 04/28/21 1633 04/29/21 0630 04/30/21 0250  WBC 9.3 9.1 9.2 11.0* 10.0  NEUTROABS  --   --  7.4 9.1* 7.4  HGB 10.9* 11.3* 10.5* 10.9* 10.8*  HCT 32.9* 32.6* 30.0* 31.4* 31.5*  MCV 93.7 88.8 89.0 88.2 89.0  PLT 208 269 280 287 308   Cardiac Enzymes: No results for input(s): CKTOTAL, CKMB, CKMBINDEX, TROPONINI in the last 168 hours. BNP (last 3 results) No results for input(s): PROBNP in the last 8760 hours. CBG: Recent Labs  Lab 05/01/21 0619 05/01/21 1154 05/01/21 1654 05/01/21 2105 05/02/21 0610  GLUCAP 126* 147* 172* 214* 132*   D-Dimer: No results for input(s): DDIMER in the last 72  hours. Hgb A1c: No results for input(s): HGBA1C in the last 72 hours. Lipid Profile: No results for input(s): CHOL, HDL, LDLCALC, TRIG, CHOLHDL, LDLDIRECT in the last 72 hours. Thyroid function studies: No results for input(s): TSH, T4TOTAL, T3FREE, THYROIDAB in the last 72 hours.  Invalid input(s): FREET3 Anemia work up: No results for input(s): VITAMINB12, FOLATE, FERRITIN, TIBC, IRON, RETICCTPCT in the last 72 hours. Sepsis Labs: Recent Labs  Lab 04/25/21 1653 04/26/21 0701 04/28/21 0749 04/28/21 1633 04/29/21 0630 04/30/21 0250  WBC  --    < > 9.1 9.2 11.0* 10.0  LATICACIDVEN 0.8  --   --   --   --   --    < > = values in this interval not displayed.    Microbiology Recent Results (from the past 240 hour(s))  Blood culture (routine single)     Status: Abnormal   Collection Time: 04/24/21 10:42 AM   Specimen: Right Antecubital; Blood  Result Value Ref Range Status   Specimen Description   Final    RIGHT ANTECUBITAL Performed at [facility], [address], [place], [place] [address]    Special Requests   Final    BOTTLES DRAWN AEROBIC AND ANAEROBIC Blood Culture adequate volume Performed at [facility], [address],  [place], [place] [address]    Culture  Setup Time   Final    GRAM POSITIVE COCCI Gram Stain Report Called to,Read Back By and Verified With: BRAME,M @ 2048 04/24/21 BY STEPHTR IN BOTH AEROBIC AND ANAEROBIC BOTTLES CRITICAL RESULT CALLED TO, READ BACK BY AND VERIFIED WITH: RN N.MOODY AT 0146 ON 04/25/2021 BY T.SAAD. Performed at [facility], [address] [place], [place] [address]    Culture GROUP B STREP(S.AGALACTIAE)ISOLATED (A)  Final   Report Status 04/27/2021 FINAL  Final   Organism ID, Bacteria GROUP B STREP(S.AGALACTIAE)ISOLATED  Final      Susceptibility   Group b strep(s.agalactiae)isolated - MIC*    CLINDAMYCIN >=1 RESISTANT Resistant     AMPICILLIN <=0.25 SENSITIVE Sensitive     ERYTHROMYCIN >=8 RESISTANT Resistant      VANCOMYCIN 0.5 SENSITIVE Sensitive     CEFTRIAXONE <=0.12 SENSITIVE Sensitive     LEVOFLOXACIN 0.5 SENSITIVE Sensitive     PENICILLIN Value in next row Sensitive      SENSITIVE0.06    * GROUP B STREP(S.AGALACTIAE)ISOLATED  Blood Culture ID Panel (Reflexed)     Status: Abnormal   Collection Time: 04/24/21 10:42 AM  Result Value Ref Range Status   Enterococcus faecalis NOT DETECTED NOT DETECTED Final   Enterococcus Faecium NOT DETECTED NOT DETECTED Final   Listeria monocytogenes NOT DETECTED NOT DETECTED Final   Staphylococcus species NOT DETECTED NOT DETECTED Final   Staphylococcus aureus (BCID) NOT DETECTED NOT DETECTED Final   Staphylococcus epidermidis NOT DETECTED NOT DETECTED Final   Staphylococcus lugdunensis NOT DETECTED NOT DETECTED Final   Streptococcus species DETECTED (A) NOT DETECTED Final    Comment: CRITICAL RESULT CALLED TO, READ BACK BY AND VERIFIED WITH: RN N.MOODY AT 0146 ON 04/25/2021 BY T.SAAD.    Streptococcus agalactiae DETECTED (A) NOT DETECTED Final    Comment: CRITICAL RESULT CALLED TO, READ BACK BY AND VERIFIED WITH: RN N.MOODY AT 0146 ON 04/25/2021 BY T.SAAD.    Streptococcus pneumoniae NOT DETECTED NOT DETECTED Final   Streptococcus pyogenes NOT DETECTED NOT DETECTED Final   A.calcoaceticus-baumannii NOT DETECTED NOT DETECTED Final   Bacteroides fragilis NOT DETECTED NOT DETECTED Final   Enterobacterales NOT DETECTED NOT DETECTED Final   Enterobacter cloacae complex NOT DETECTED NOT DETECTED Final   Escherichia coli NOT DETECTED NOT DETECTED Final   Klebsiella aerogenes NOT DETECTED NOT DETECTED Final   Klebsiella oxytoca NOT DETECTED NOT DETECTED Final   Klebsiella pneumoniae NOT DETECTED NOT DETECTED Final   Proteus species NOT DETECTED NOT DETECTED Final   Salmonella species NOT DETECTED NOT DETECTED Final   Serratia marcescens NOT DETECTED NOT DETECTED Final   Haemophilus influenzae NOT DETECTED NOT DETECTED Final   Neisseria meningitidis NOT  DETECTED NOT DETECTED Final   Pseudomonas aeruginosa NOT DETECTED NOT DETECTED Final   Stenotrophomonas maltophilia NOT DETECTED NOT DETECTED Final   Candida albicans NOT DETECTED NOT DETECTED Final   Candida auris NOT DETECTED NOT DETECTED Final   Candida glabrata NOT DETECTED NOT DETECTED Final   Candida krusei NOT DETECTED NOT DETECTED Final   Candida parapsilosis NOT DETECTED NOT DETECTED Final   Candida tropicalis NOT DETECTED NOT DETECTED Final   Cryptococcus neoformans/gattii NOT DETECTED NOT DETECTED Final    Comment: Performed at [facility], [address], [place], [place] [address]  Urine Culture     Status: Abnormal   Collection Time: 04/24/21 11:20 AM   Specimen: In/Out Cath Urine  Result Value Ref Range Status  Specimen Description   Final    IN/OUT CATH URINE Performed at [facility], [address] [place], [place] [address]    Special Requests   Final    NONE Performed at [facility], [address], [place], [place] [address]    Culture (A)  Final    10,000 COLONIES/mL GROUP B STREP(S.AGALACTIAE)ISOLATED TESTING AGAINST S. AGALACTIAE NOT ROUTINELY PERFORMED DUE TO PREDICTABILITY OF AMP/PEN/VAN SUSCEPTIBILITY. Performed at [facility], [address], [place], [place] [address]    Report Status 04/25/2021 FINAL  Final  Resp Panel by RT-PCR (Flu A&B, Covid) Nasopharyngeal Swab     Status: None   Collection Time: 04/24/21  1:15 PM   Specimen: Nasopharyngeal Swab; Nasopharyngeal(NP) swabs in vial transport medium  Result Value Ref Range Status   SARS Coronavirus 2 by RT PCR NEGATIVE NEGATIVE Final    Comment: (NOTE) SARS-CoV-2 target nucleic acids are NOT DETECTED.  The SARS-CoV-2 RNA is generally detectable in upper respiratory specimens during the acute phase of infection. The lowest concentration of SARS-CoV-2 viral copies this assay can detect is 138 copies/mL. A negative result does not preclude SARS-Cov-2 infection and should not be used  as the sole basis for treatment or other patient management decisions. A negative result may occur with  improper specimen collection/handling, submission of specimen other than nasopharyngeal swab, presence of viral mutation(s) within the areas targeted by this assay, and inadequate number of viral copies(<138 copies/mL). A negative result must be combined with clinical observations, patient history, and epidemiological information. The expected result is Negative.  Fact Sheet for Patients:  BloggerCourse.com  Fact Sheet for Healthcare Providers:  SeriousBroker.it  This test is no t yet approved or cleared by the [place] FDA and  has been authorized for detection and/or diagnosis of SARS-CoV-2 by FDA under an Emergency Use Authorization (EUA). This EUA will remain  in effect (meaning this test can be used) for the duration of the COVID-19 declaration under Section 564(b)(1) of the Act, 21 U.S.C.section 360bbb-3(b)(1), unless the authorization is terminated  or revoked sooner.       Influenza A by PCR NEGATIVE NEGATIVE Final   Influenza B by PCR NEGATIVE NEGATIVE Final    Comment: (NOTE) The Xpert Xpress SARS-CoV-2/FLU/RSV plus assay is intended as an aid in the diagnosis of influenza from Nasopharyngeal swab specimens and should not be used as a sole basis for treatment. Nasal washings and aspirates are unacceptable for Xpert Xpress SARS-CoV-2/FLU/RSV testing.  Fact Sheet for Patients: BloggerCourse.com  Fact Sheet for Healthcare Providers: SeriousBroker.it  This test is not yet approved or cleared by the [place] FDA and has been authorized for detection and/or diagnosis of SARS-CoV-2 by FDA under an Emergency Use Authorization (EUA). This EUA will remain in effect (meaning this test can be used) for the duration of the COVID-19 declaration under Section 564(b)(1)  of the Act, 21 U.S.C. section 360bbb-3(b)(1), unless the authorization is terminated or revoked.  Performed at [facility], [address] [place], [place] [address]   Culture, blood (Routine X 2) w Reflex to ID Panel     Status: None   Collection Time: 04/25/21  7:26 AM   Specimen: BLOOD RIGHT ARM  Result Value Ref Range Status   Specimen Description BLOOD RIGHT ARM  Final   Special Requests   Final    BOTTLES DRAWN AEROBIC AND ANAEROBIC Blood Culture adequate volume   Culture   Final    NO GROWTH 5 DAYS Performed at  [facility], [address] [place], [place] [address]    Report Status 04/30/2021 FINAL  Final  Culture, blood (Routine X 2) w Reflex to ID Panel     Status: None   Collection Time: 04/25/21  7:32 AM   Specimen: BLOOD RIGHT ARM  Result Value Ref Range Status   Specimen Description BLOOD RIGHT ARM  Final   Special Requests   Final    BOTTLES DRAWN AEROBIC AND ANAEROBIC Blood Culture adequate volume   Culture   Final    NO GROWTH 5 DAYS Performed at [facility], [address], [place], [place] [address]    Report Status 04/30/2021 FINAL  Final  MRSA Next Gen by PCR, Nasal     Status: None   Collection Time: 04/25/21  8:14 PM   Specimen: Nasal Mucosa; Nasal Swab  Result Value Ref Range Status   MRSA by PCR Next Gen NOT DETECTED NOT DETECTED Final    Comment: (NOTE) The GeneXpert MRSA Assay (FDA approved for NASAL specimens only), is one component of a comprehensive MRSA colonization surveillance program. It is not intended to diagnose MRSA infection nor to guide or monitor treatment for MRSA infections. Test performance is not FDA approved in patients less than 33 years old. Performed at [facility], [address] [place], [place] [address]     Procedures and diagnostic studies:  VAS [place] LOWER EXTREMITY SAPHENOUS VEIN MAPPING  Result Date: 05/02/2021 LOWER EXTREMITY VEIN MAPPING Patient Name:  FELICIA BOTH  Date of Exam:   05/02/2021 Medical Rec #:  [id number]      Accession #:    [id number] Date of Birth: Aug 23, 1946     Patient Gender: M Patient Age:   25 years Exam Location:  [facility] Procedure:      VAS [place] LOWER EXTREMITY SAPHENOUS VEIN MAPPING Referring Phys: TIFFANY [place] --------------------------------------------------------------------------------  Indications:  Pre-op Risk Factors: PAD.  Comparison Study: No prior study Performing Technologist: Sherren Kerns RVS  Examination Guidelines: A complete evaluation includes B-mode imaging, spectral Doppler, color Doppler, and power Doppler as needed of all accessible portions of each vessel. Bilateral testing is considered an integral part of a complete examination. Limited examinations for reoccurring indications may be performed as noted. +---------------+-----------+----------------------+---------------+-----------+   RT Diameter  RT Findings         GSV            LT Diameter  LT Findings      (cm)                                            (cm)                  +---------------+-----------+----------------------+---------------+-----------+      0.66                     Saphenofemoral         0.50                                                   Junction                                  +---------------+-----------+----------------------+---------------+-----------+  0.39       branching     Proximal thigh         0.47                  +---------------+-----------+----------------------+---------------+-----------+      0.32                       Mid thigh            0.48                  +---------------+-----------+----------------------+---------------+-----------+      0.24                      Distal thigh          0.46                  +---------------+-----------+----------------------+---------------+-----------+      0.22                          Knee              0.35                   +---------------+-----------+----------------------+---------------+-----------+      0.18                       Prox calf            0.40       branching  +---------------+-----------+----------------------+---------------+-----------+      0.16                        Mid calf            0.44                  +---------------+-----------+----------------------+---------------+-----------+      0.28       branching      Distal calf           0.37       branching  +---------------+-----------+----------------------+---------------+-----------+      0.21       branching         Ankle              0.35                  +---------------+-----------+----------------------+---------------+-----------+ Diagnosing physician: Waverly Ferrari MD Electronically signed by Waverly Ferrari MD on 05/02/2021 at 10:33:36 AM.    Final    ECHO TEE  Result Date: 05/01/2021    TRANSESOPHOGEAL ECHO REPORT   Patient Name:   YAZEN ROSKO Date of Exam: 05/01/2021 Medical Rec #:  [id number]     Height:       72.0 in Accession #:    [id number]    Weight:       221.3 lb Date of Birth:  July 04, 1946    BSA:          2.224 m Patient Age:    74 years      BP:           125/46 mmHg Patient Gender: M             HR:           96 bpm. Exam Location:  Inpatient Procedure: Transesophageal Echo, Cardiac Doppler and Color Doppler Indications:  Bacteremia  History:         Patient has prior history of Echocardiogram examinations, most                  recent 04/26/2021. COPD, Arrythmias:Atrial Fibrillation; Risk                  Factors:Hypertension, Diabetes and Dyslipidemia.  Sonographer:     Ross Ludwig RDCS (AE) Referring Phys:  [id number] HAO MENG Diagnosing Phys: Chilton Si MD PROCEDURE: After discussion of the risks and benefits of a TEE, an informed consent was obtained from the patient. The transesophogeal probe was passed without difficulty through the esophogus of the patient. Local oropharyngeal anesthetic  was provided with Cetacaine. Sedation performed by different physician. The patient was monitored while under deep sedation. Anesthestetic sedation was provided intravenously by Anesthesiology: 182.57mg  of Propofol, 60mg  of Lidocaine. Image quality was adequate. The patient's vital signs; including heart rate, blood pressure, and oxygen saturation; remained stable throughout the procedure. The patient developed no complications during the procedure. IMPRESSIONS  1. Left ventricular ejection fraction, by estimation, is 60 to 65%. The left ventricle has normal function. The left ventricle has no regional wall motion abnormalities.  2. Right ventricular systolic function is normal. The right ventricular size is normal.  3. No left atrial/left atrial appendage thrombus was detected.  4. The mitral valve is normal in structure. Mild mitral valve regurgitation. No evidence of mitral stenosis.  5. 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 Functionally bicuspid. The aortic valve is calcified. There is moderate calcification of the aortic valve. There is moderate thickening of the aortic valve. Aortic valve regurgitation is not visualized. Mild to moderate aortic valve sclerosis/calcification is present, without any evidence of aortic stenosis. Aortic valve mean gradient measures 8.0 mmHg. Aortic valve Vmax measures 1.96 m/s.  6. 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  7. The inferior vena cava is normal in size with greater than 50% respiratory variability, suggesting right atrial pressure of 3 mmHg. Conclusion(s)/Recommendation(s): No evidence of vegetation/infective endocarditis on this transesophageal echocardiogram. FINDINGS  Left Ventricle: Left ventricular ejection fraction, by estimation, is 60 to 65%. The left ventricle has normal function. The left ventricle has no regional wall motion abnormalities. The left ventricular internal cavity size was normal in size. There  is  no left ventricular hypertrophy. Right Ventricle: The right ventricular size is normal. No increase in right ventricular wall thickness. Right ventricular systolic function is normal. Left Atrium: Left atrial size was normal in size. No left atrial/left atrial appendage thrombus was detected. Right Atrium: Right atrial size was normal in size. Pericardium: There is no evidence of pericardial effusion. Mitral Valve: The mitral valve is normal in structure. Mild mitral valve regurgitation. No evidence of mitral valve stenosis. Tricuspid Valve: The tricuspid valve is normal in structure. Tricuspid valve regurgitation is not demonstrated. No evidence of tricuspid stenosis. 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 Functionally bicuspid. The aortic valve is calcified. There is moderate calcification of the aortic valve. There is moderate thickening of the aortic valve. Aortic valve regurgitation is not visualized. Mild to moderate aortic valve sclerosis/calcification is present, without any evidence of aortic stenosis. Aortic valve mean gradient measures 8.0 mmHg. Aortic valve peak gradient measures 15.4 mmHg. Pulmonic Valve: The pulmonic valve was normal in structure. Pulmonic valve regurgitation is not visualized. No evidence of pulmonic stenosis. Aorta: The aortic root is normal in size and structure. There  is moderate (Grade III) layered plaque involving the descending aorta. Venous: The inferior vena cava is normal in size with greater than 50% respiratory variability, suggesting right atrial pressure of 3 mmHg. IAS/Shunts: No atrial level shunt detected by color flow Doppler.  AORTIC VALVE AV Vmax:      196.00 cm/s AV Vmean:     133.000 cm/s AV VTI:       0.351 m AV Peak Grad: 15.4 mmHg AV Mean Grad: 8.0 mmHg Chilton Si MD Electronically signed by Chilton Si MD Signature Date/Time: 05/01/2021/12:51:27 PM    Final                LOS: 8 days   Romina Divirgilio  [facility]   Pager on [email or web address]. If 7PM-7AM, please contact night-coverage at [email or web address]     05/02/2021, 11:17 AM

## 2021-05-02 NOTE — Progress Notes (Signed)
Pt asked to walk this am to qualify for oxygen at home. Pt refused this morning at 11am, came back at 1400, patient is refusing again. MD notified.   Kalman Jewels, RN 05/02/2021 2:00 PM

## 2021-05-02 NOTE — Progress Notes (Signed)
VASCULAR LAB    Lower extremity vein mapping has been performed.  See CV proc for preliminary results.   Zakiya Sporrer, RVT 05/02/2021, 9:06 AM

## 2021-05-02 NOTE — Progress Notes (Signed)
Mobility Specialist: Progress Note   05/02/21 1459  Mobility  Activity Ambulated in hall  Level of Assistance Standby assist, set-up cues, supervision of patient - no hands on  Assistive Device Front wheel walker  Distance Ambulated (ft) 470 ft  Mobility Ambulated with assistance in hallway  Mobility Response Tolerated fair  Mobility performed by Mobility specialist;Nurse  $Mobility charge 1 Mobility   Post-Mobility: 92 HR, 146/61 BP, 97% SpO2  Pt asx throughout ambulation. Attempted to do walking saturation test with RN, unsure of accurate reading of pulse oximeter. When placed back on the monitor in the room pt sats 94-98% with good pleth. Will re-attempt walking saturation test at later date.   [facility] Todd Zhang Mobility Specialist Mobility Specialist Phone: [phone]

## 2021-05-02 NOTE — Progress Notes (Signed)
SATURATION QUALIFICATIONS: (This note is used to comply with regulatory documentation for home oxygen)  Patient Saturations on Room Air at Rest = 95%  Patient Saturations on Room Air while Ambulating = 79%  Patient Saturations on 0 Liters of oxygen while Ambulating = 79%  Please briefly explain why patient needs home oxygen:   Pt was able to walk at decent speed while carrying on a conversation, Telebox and 02 pleth was not able to be used during ambulation because it was not working. Portable pulse ox was used from mobility specialist and reading may not have been accurate. Once we were back in bed telebox O2 showed 95%., O2 walking test should be repeated due to possible inaccuracy. Pt refused to walk again after returning to room. May try later this afternoon. If not complete test should be done at a later date.   Kalman Jewels, RN

## 2021-05-02 NOTE — Plan of Care (Signed)
  Problem: Nutrition: Goal: Adequate nutrition will be maintained Outcome: Progressing   

## 2021-05-03 ENCOUNTER — Other Ambulatory Visit: Payer: Self-pay | Admitting: Nurse Practitioner

## 2021-05-03 DIAGNOSIS — L03119 Cellulitis of unspecified part of limb: Secondary | ICD-10-CM | POA: Diagnosis not present

## 2021-05-03 DIAGNOSIS — I4891 Unspecified atrial fibrillation: Secondary | ICD-10-CM | POA: Diagnosis not present

## 2021-05-03 DIAGNOSIS — L02619 Cutaneous abscess of unspecified foot: Secondary | ICD-10-CM | POA: Diagnosis not present

## 2021-05-03 DIAGNOSIS — M86172 Other acute osteomyelitis, left ankle and foot: Secondary | ICD-10-CM | POA: Diagnosis not present

## 2021-05-03 LAB — GLUCOSE, CAPILLARY
Glucose-Capillary: 130 mg/dL — ABNORMAL HIGH (ref 70–99)
Glucose-Capillary: 149 mg/dL — ABNORMAL HIGH (ref 70–99)
Glucose-Capillary: 210 mg/dL — ABNORMAL HIGH (ref 70–99)
Glucose-Capillary: 287 mg/dL — ABNORMAL HIGH (ref 70–99)

## 2021-05-03 MED ORDER — ALPRAZOLAM 0.5 MG PO TABS
0.5000 mg | ORAL_TABLET | Freq: Every evening | ORAL | Status: AC | PRN
  Administered 2021-05-03: 0.5 mg via ORAL
  Filled 2021-05-03: qty 1

## 2021-05-03 NOTE — Progress Notes (Addendum)
VASCULAR SURGERY:  He is scheduled for a left femoral endarterectomy and left femoral TPT bypass tomorrow with Dr. Chestine Spore.  I have discussed the plans for surgery tomorrow and answered all of his questions.  He is agreeable to proceed.  Cari Caraway, MD 10:07 AM

## 2021-05-03 NOTE — Progress Notes (Signed)
Progress Note    Todd Zhang  ZOX:[id number] DOB: May 16, 1946  DOA: 04/24/2021 PCP: [name], FNP      Brief Narrative:    Medical records reviewed and are as summarized below:  Todd Zhang is a 75 y.o. male medical history of diabetes mellitus, hypertension, hyperlipidemia, COPD, anxiety, GERD, recent visit to [facility] IR on 04/21/2020 for confusion, who presented to the hospital with 1 day history of left flank pain and 2-week history of left foot pain.  He also complained of multiple falls secondary to unsteady gait.      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  (Obesity)   Severe sepsis secondary to acute osteomyelitis left great toe, diabetic foot infection, group B strep bacteremia: S/p aortogram which showed complex PVD.  Plan for vascular surgery tomorrow. 2D echo did not show any evidence of vegetation, EF was estimated at 60 to 65%.  No evidence of vegetation orcardiac thrombus on TEE that was done on 05/01/2021.  Atrial fibrillation with RVR: Continue metoprolol and Cardizem.  Xarelto on hold because of plans for vascular surgery on 05/04/2021.  Type 2 diabetes mellitus with hyperglycemia: Hemoglobin A1c 8.3.  Continue NovoLog as needed for hyperglycemia.  COPD, suspect chronic hypoxic respiratory failure: He is still intermittently hypoxic on room air at rest.  Oxygen saturation while ambulating on room air was 79%.  Repeat pulse oximetry with ambulation prior to discharge.  He does not want to wear oxygen.  He may need home oxygen.  Hyponatremia: Improved   Other comorbidities include  CKD stage IIIb, hypertension, hyperlipidemia  Benazepril and metformin have been held.    Diet Order             Diet NPO time specified  Diet effective midnight           Diet Carb Modified Fluid consistency: Thin; Room service appropriate? Yes with Assist  Diet effective now                      Consultants: Infectious disease Vascular surgeon  Procedures: None    Medications:    atorvastatin  40 mg Oral Daily   diltiazem  240 mg Oral Daily   gabapentin  300 mg Oral TID   guaiFENesin  1,200 mg Oral BID   heparin  5,000 Units Subcutaneous Q8H   insulin aspart  0-5 Units Subcutaneous QHS   insulin aspart  0-9 Units Subcutaneous TID WC   loratadine  10 mg Oral Daily   metoprolol succinate  100 mg Oral Daily   multivitamin with minerals  1 tablet Oral Daily   pantoprazole  40 mg Oral Daily   sodium chloride flush  3 mL Intravenous Q12H   tamsulosin  0.4 mg Oral Daily   vitamin B-12  500 mcg Oral Daily   Continuous Infusions:  sodium chloride 100 mL/hr at 05/02/21 1322   cefTRIAXone (ROCEPHIN)  IV 2 g (05/02/21 1203)     Anti-infectives (From admission, onward)    Start     Dose/Rate  Route Frequency Ordered Stop   04/28/21 1200  cefTRIAXone (ROCEPHIN) 2 g in sodium chloride 0.9 % 100 mL IVPB       Note to Pharmacy: Patient tolerated cephalexin in the past   2 g 200 mL/hr over 30 Minutes Intravenous Every 24 hours 04/28/21 0958     04/27/21 1200  meropenem (MERREM) 1 g in sodium chloride 0.9 % 100 mL IVPB  Status:  Discontinued        1 g 200 mL/hr over 30 Minutes Intravenous Every 8 hours 04/27/21 0845 04/28/21 0957   04/25/21 1500  vancomycin (VANCOREADY) IVPB 1250 mg/250 mL  Status:  Discontinued        1,250 mg 166.7 mL/hr over 90 Minutes Intravenous Every 24 hours 04/24/21 1348 04/27/21 0845   04/24/21 2000  meropenem (MERREM) 500 mg in sodium chloride 0.9 % 100 mL IVPB  Status:  Discontinued        500 mg 200 mL/hr over 30 Minutes Intravenous  Every 8 hours 04/24/21 1337 04/27/21 0845   04/24/21 1430  vancomycin (VANCOREADY) IVPB 2000 mg/400 mL        2,000 mg 200 mL/hr over 120 Minutes Intravenous  Once 04/24/21 1345 04/24/21 1819   04/24/21 1115  ciprofloxacin (CIPRO) IVPB 400 mg        400 mg 200 mL/hr over 60 Minutes Intravenous  Once 04/24/21 1100 04/24/21 1340   04/24/21 1115  clindamycin (CLEOCIN) IVPB 600 mg        600 mg 100 mL/hr over 30 Minutes Intravenous  Once 04/24/21 1100 04/24/21 1226              Family Communication/Anticipated D/C date and plan/Code Status   DVT prophylaxis: heparin injection 5,000 Units Start: 04/30/21 2200 SCD's Start: 04/30/21 1433     Code Status: Full Code  Family Communication: 2 sisters at the bedside Disposition Plan:    Status is: Inpatient  Remains inpatient appropriate because:IV treatments appropriate due to intensity of illness or inability to take PO and Inpatient level of care appropriate due to severity of illness  Dispo: The patient is from: Home              Anticipated d/c is to: Home              Patient currently is not medically stable to d/c.   Difficult to place patient No           Subjective:   Interval events noted.  No new complaints.  He still has pain in the left foot.  Objective:    Vitals:   05/02/21 2344 05/02/21 2345 05/03/21 0814 05/03/21 1102  BP: 129/63  (!) 166/90 (!) 163/93  Pulse: 87 89 89 96  Resp: (!) 28 20 20 18   Temp: 97.8 F (36.6 C)  98.2 F (36.8 C) 98.1 F (36.7 C)  TempSrc: Oral  Oral Oral  SpO2: 90% (!) 89% 90% 98%  Weight:      Height:       No data found.   Intake/Output Summary (Last 24 hours) at 05/03/2021 1231 Last data filed at 05/03/2021 1100 Gross per 24 hour  Intake 240 ml  Output 950 ml  Net -710 ml   Filed Weights   04/25/21 2002 04/30/21 1432 05/01/21 0834  Weight: 102.6 kg 100.4 kg 100.4 kg    Exam:  GEN: NAD SKIN: Warm and dry EYES: No pallor or icterus ENT: MMM CV:  RRR PULM: CTA B ABD: soft,  ND, NT, +BS CNS: AAO x 3, non focal EXT: Erythema of the proximal left foot and left big toe.  Wound on the left big toe without any drainage.             Data Reviewed:   I have personally reviewed following labs and imaging studies:  Labs: Labs show the following:   Basic Metabolic Panel: Recent Labs  Lab 04/27/21 0403 04/28/21 0749 04/29/21 0630 04/30/21 0250 05/01/21 0603  NA 131* 134* 132* 134* 136  K 3.8 3.8 3.6 3.5 3.7  CL 100 100 101 100 105  CO2 GLUCOSE 169* 199* 193* 138* 140*  BUN CREATININE 1.31* 1.17 1.28* 1.30* 1.10  CALCIUM 7.7* 8.4* 8.3* 8.6* 8.1*  MG 1.8 1.6* 1.6*  --   --   PHOS  --  2.4* 2.7  --   --    GFR Estimated Creatinine Clearance: 71.2 mL/min (by C-G formula based on SCr of 1.1 mg/dL). Liver Function Tests: Recent Labs  Lab 04/29/21 0630 04/30/21 0250 05/01/21 0603  AST 64* 89* 49*  ALT 51* 68* 56*  ALKPHOS 91 78 81  BILITOT 0.7 0.4 0.4  PROT 5.9* 5.7* 5.5*  ALBUMIN 2.1* 2.0* 1.9*   No results for input(s): LIPASE, AMYLASE in the last 168 hours. No results for input(s): AMMONIA in the last 168 hours. Coagulation profile No results for input(s): INR, PROTIME in the last 168 hours.   CBC: Recent Labs  Lab 04/27/21 0403 04/28/21 0749 04/28/21 1633 04/29/21 0630 04/30/21 0250  WBC 9.3 9.1 9.2 11.0* 10.0  NEUTROABS  --   --  7.4 9.1* 7.4  HGB 10.9* 11.3* 10.5* 10.9* 10.8*  HCT 32.9* 32.6* 30.0* 31.4* 31.5*  MCV 93.7 88.8 89.0 88.2 89.0  PLT 208 269 280 287 308   Cardiac Enzymes: No results for input(s): CKTOTAL, CKMB, CKMBINDEX, TROPONINI in the last 168 hours. BNP (last 3 results) No results for input(s): PROBNP in the last 8760 hours. CBG: Recent Labs  Lab 05/02/21 1207 05/02/21 1619 05/02/21 2102 05/03/21 0613 05/03/21 1203  GLUCAP 161* 138* 145* 130* 149*   D-Dimer: No results for input(s): DDIMER in the last 72 hours. Hgb A1c: No results  for input(s): HGBA1C in the last 72 hours. Lipid Profile: No results for input(s): CHOL, HDL, LDLCALC, TRIG, CHOLHDL, LDLDIRECT in the last 72 hours. Thyroid function studies: No results for input(s): TSH, T4TOTAL, T3FREE, THYROIDAB in the last 72 hours.  Invalid input(s): FREET3 Anemia work up: No results for input(s): VITAMINB12, FOLATE, FERRITIN, TIBC, IRON, RETICCTPCT in the last 72 hours. Sepsis Labs: Recent Labs  Lab 04/28/21 0749 04/28/21 1633 04/29/21 0630 04/30/21 0250  WBC 9.1 9.2 11.0* 10.0    Microbiology Recent Results (from the past 240 hour(s))  Blood culture (routine single)     Status: Abnormal   Collection Time: 04/24/21 10:42 AM   Specimen: Right Antecubital; Blood  Result Value Ref Range Status   Specimen Description   Final    RIGHT ANTECUBITAL Performed at [facility], [address] [place], [place] [address]    Special Requests   Final    BOTTLES DRAWN AEROBIC AND ANAEROBIC Blood Culture adequate volume Performed at [facility], [address] [place], [place] [address]    Culture  Setup Time   Final    GRAM POSITIVE COCCI Gram Stain Report Called to,Read Back By and Verified With: BRAME,M @ 2048 04/24/21 BY STEPHTR  IN BOTH AEROBIC AND ANAEROBIC BOTTLES CRITICAL RESULT CALLED TO, READ BACK BY AND VERIFIED WITH: RN N.MOODY AT 0146 ON 04/25/2021 BY T.SAAD. Performed at [facility], [address] [place], [place] [address]    Culture GROUP B STREP(S.AGALACTIAE)ISOLATED (A)  Final   Report Status 04/27/2021 FINAL  Final   Organism ID, Bacteria GROUP B STREP(S.AGALACTIAE)ISOLATED  Final      Susceptibility   Group b strep(s.agalactiae)isolated - MIC*    CLINDAMYCIN >=1 RESISTANT Resistant     AMPICILLIN <=0.25 SENSITIVE Sensitive     ERYTHROMYCIN >=8 RESISTANT Resistant     VANCOMYCIN 0.5 SENSITIVE Sensitive     CEFTRIAXONE <=0.12 SENSITIVE Sensitive     LEVOFLOXACIN 0.5 SENSITIVE Sensitive     PENICILLIN Value in next row Sensitive       SENSITIVE0.06    * GROUP B STREP(S.AGALACTIAE)ISOLATED  Blood Culture ID Panel (Reflexed)     Status: Abnormal   Collection Time: 04/24/21 10:42 AM  Result Value Ref Range Status   Enterococcus faecalis NOT DETECTED NOT DETECTED Final   Enterococcus Faecium NOT DETECTED NOT DETECTED Final   Listeria monocytogenes NOT DETECTED NOT DETECTED Final   Staphylococcus species NOT DETECTED NOT DETECTED Final   Staphylococcus aureus (BCID) NOT DETECTED NOT DETECTED Final   Staphylococcus epidermidis NOT DETECTED NOT DETECTED Final   Staphylococcus lugdunensis NOT DETECTED NOT DETECTED Final   Streptococcus species DETECTED (A) NOT DETECTED Final    Comment: CRITICAL RESULT CALLED TO, READ BACK BY AND VERIFIED WITH: RN N.MOODY AT 0146 ON 04/25/2021 BY T.SAAD.    Streptococcus agalactiae DETECTED (A) NOT DETECTED Final    Comment: CRITICAL RESULT CALLED TO, READ BACK BY AND VERIFIED WITH: RN N.MOODY AT 0146 ON 04/25/2021 BY T.SAAD.    Streptococcus pneumoniae NOT DETECTED NOT DETECTED Final   Streptococcus pyogenes NOT DETECTED NOT DETECTED Final   A.calcoaceticus-baumannii NOT DETECTED NOT DETECTED Final   Bacteroides fragilis NOT DETECTED NOT DETECTED Final   Enterobacterales NOT DETECTED NOT DETECTED Final   Enterobacter cloacae complex NOT DETECTED NOT DETECTED Final   Escherichia coli NOT DETECTED NOT DETECTED Final   Klebsiella aerogenes NOT DETECTED NOT DETECTED Final   Klebsiella oxytoca NOT DETECTED NOT DETECTED Final   Klebsiella pneumoniae NOT DETECTED NOT DETECTED Final   Proteus species NOT DETECTED NOT DETECTED Final   Salmonella species NOT DETECTED NOT DETECTED Final   Serratia marcescens NOT DETECTED NOT DETECTED Final   Haemophilus influenzae NOT DETECTED NOT DETECTED Final   Neisseria meningitidis NOT DETECTED NOT DETECTED Final   Pseudomonas aeruginosa NOT DETECTED NOT DETECTED Final   Stenotrophomonas maltophilia NOT DETECTED NOT DETECTED Final   Candida albicans NOT  DETECTED NOT DETECTED Final   Candida auris NOT DETECTED NOT DETECTED Final   Candida glabrata NOT DETECTED NOT DETECTED Final   Candida krusei NOT DETECTED NOT DETECTED Final   Candida parapsilosis NOT DETECTED NOT DETECTED Final   Candida tropicalis NOT DETECTED NOT DETECTED Final   Cryptococcus neoformans/gattii NOT DETECTED NOT DETECTED Final    Comment: Performed at [facility], [address], [place], [place] [address]  Urine Culture     Status: Abnormal   Collection Time: 04/24/21 11:20 AM   Specimen: In/Out Cath Urine  Result Value Ref Range Status   Specimen Description   Final    IN/OUT CATH URINE Performed at [facility], [address], [place], [place] [address]    Special Requests   Final    NONE Performed  at [facility], [address] [place], [place] [address]    Culture (A)  Final    10,000 COLONIES/mL GROUP B STREP(S.AGALACTIAE)ISOLATED TESTING AGAINST S. AGALACTIAE NOT ROUTINELY PERFORMED DUE TO PREDICTABILITY OF AMP/PEN/VAN SUSCEPTIBILITY. Performed at [facility], [address] [place], [place] [address]    Report Status 04/25/2021 FINAL  Final  Resp Panel by RT-PCR (Flu A&B, Covid) Nasopharyngeal Swab     Status: None   Collection Time: 04/24/21  1:15 PM   Specimen: Nasopharyngeal Swab; Nasopharyngeal(NP) swabs in vial transport medium  Result Value Ref Range Status   SARS Coronavirus 2 by RT PCR NEGATIVE NEGATIVE Final    Comment: (NOTE) SARS-CoV-2 target nucleic acids are NOT DETECTED.  The SARS-CoV-2 RNA is generally detectable in upper respiratory specimens during the acute phase of infection. The lowest concentration of SARS-CoV-2 viral copies this assay can detect is 138 copies/mL. A negative result does not preclude SARS-Cov-2 infection and should not be used as the sole basis for treatment or other patient management decisions. A negative result may occur with  improper specimen collection/handling, submission of specimen  other than nasopharyngeal swab, presence of viral mutation(s) within the areas targeted by this assay, and inadequate number of viral copies(<138 copies/mL). A negative result must be combined with clinical observations, patient history, and epidemiological information. The expected result is Negative.  Fact Sheet for Patients:  BloggerCourse.comhttps://www.fda.gov/media/152166/download  Fact Sheet for Healthcare Providers:  SeriousBroker.ithttps://www.fda.gov/media/152162/download  This test is no t yet approved or cleared by the [place] FDA and  has been authorized for detection and/or diagnosis of SARS-CoV-2 by FDA under an Emergency Use Authorization (EUA). This EUA will remain  in effect (meaning this test can be used) for the duration of the COVID-19 declaration under Section 564(b)(1) of the Act, 21 U.S.C.section 360bbb-3(b)(1), unless the authorization is terminated  or revoked sooner.       Influenza A by PCR NEGATIVE NEGATIVE Final   Influenza B by PCR NEGATIVE NEGATIVE Final    Comment: (NOTE) The Xpert Xpress SARS-CoV-2/FLU/RSV plus assay is intended as an aid in the diagnosis of influenza from Nasopharyngeal swab specimens and should not be used as a sole basis for treatment. Nasal washings and aspirates are unacceptable for Xpert Xpress SARS-CoV-2/FLU/RSV testing.  Fact Sheet for Patients: BloggerCourse.comhttps://www.fda.gov/media/152166/download  Fact Sheet for Healthcare Providers: SeriousBroker.ithttps://www.fda.gov/media/152162/download  This test is not yet approved or cleared by the [place] FDA and has been authorized for detection and/or diagnosis of SARS-CoV-2 by FDA under an Emergency Use Authorization (EUA). This EUA will remain in effect (meaning this test can be used) for the duration of the COVID-19 declaration under Section 564(b)(1) of the Act, 21 U.S.C. section 360bbb-3(b)(1), unless the authorization is terminated or revoked.  Performed at [facility], [address] [place], [place]  [address]   Culture, blood (Routine X 2) w Reflex to ID Panel     Status: None   Collection Time: 04/25/21  7:26 AM   Specimen: BLOOD RIGHT ARM  Result Value Ref Range Status   Specimen Description BLOOD RIGHT ARM  Final   Special Requests   Final    BOTTLES DRAWN AEROBIC AND ANAEROBIC Blood Culture adequate volume   Culture   Final    NO GROWTH 5 DAYS Performed at [facility], [address] [place], [place] [address]    Report Status 04/30/2021 FINAL  Final  Culture, blood (Routine X 2) w Reflex to ID Panel     Status: None  Collection Time: 04/25/21  7:32 AM   Specimen: BLOOD RIGHT ARM  Result Value Ref Range Status   Specimen Description BLOOD RIGHT ARM  Final   Special Requests   Final    BOTTLES DRAWN AEROBIC AND ANAEROBIC Blood Culture adequate volume   Culture   Final    NO GROWTH 5 DAYS Performed at [facility], [address], [place], [place] [address]    Report Status 04/30/2021 FINAL  Final  MRSA Next Gen by PCR, Nasal     Status: None   Collection Time: 04/25/21  8:14 PM   Specimen: Nasal Mucosa; Nasal Swab  Result Value Ref Range Status   MRSA by PCR Next Gen NOT DETECTED NOT DETECTED Final    Comment: (NOTE) The GeneXpert MRSA Assay (FDA approved for NASAL specimens only), is one component of a comprehensive MRSA colonization surveillance program. It is not intended to diagnose MRSA infection nor to guide or monitor treatment for MRSA infections. Test performance is not FDA approved in patients less than 47 years old. Performed at [facility], [address] [place], [place] [address]     Procedures and diagnostic studies:  VAS [place] LOWER EXTREMITY SAPHENOUS VEIN MAPPING  Result Date: 05/02/2021 LOWER EXTREMITY VEIN MAPPING Patient Name:  NAHIEM DREDGE  Date of Exam:   05/02/2021 Medical Rec #: [id number]      Accession #:    [id number] Date of Birth: [birth year]     Patient Gender: M Patient Age:   45 years Exam Location:  [facility] Procedure:       VAS [place] LOWER EXTREMITY SAPHENOUS VEIN MAPPING Referring Phys: TIFFANY Sellers --------------------------------------------------------------------------------  Indications:  Pre-op Risk Factors: PAD.  Comparison Study: No prior study Performing Technologist: Sherren Kerns RVS  Examination Guidelines: A complete evaluation includes B-mode imaging, spectral Doppler, color Doppler, and power Doppler as needed of all accessible portions of each vessel. Bilateral testing is considered an integral part of a complete examination. Limited examinations for reoccurring indications may be performed as noted. +---------------+-----------+----------------------+---------------+-----------+   RT Diameter  RT Findings         GSV            LT Diameter  LT Findings      (cm)                                            (cm)                  +---------------+-----------+----------------------+---------------+-----------+      0.66                     Saphenofemoral         0.50                                                   Junction                                  +---------------+-----------+----------------------+---------------+-----------+      0.39       branching     Proximal thigh         0.47                  +---------------+-----------+----------------------+---------------+-----------+  0.32                       Mid thigh            0.48                  +---------------+-----------+----------------------+---------------+-----------+      0.24                      Distal thigh          0.46                  +---------------+-----------+----------------------+---------------+-----------+      0.22                          Knee              0.35                  +---------------+-----------+----------------------+---------------+-----------+      0.18                       Prox calf            0.40       branching   +---------------+-----------+----------------------+---------------+-----------+      0.16                        Mid calf            0.44                  +---------------+-----------+----------------------+---------------+-----------+      0.28       branching      Distal calf           0.37       branching  +---------------+-----------+----------------------+---------------+-----------+      0.21       branching         Ankle              0.35                  +---------------+-----------+----------------------+---------------+-----------+ Diagnosing physician: Waverly Ferrari MD Electronically signed by Waverly Ferrari MD on 05/02/2021 at 10:33:36 AM.    Final                LOS: 9 days   Issaih Kaus  [facility]   Pager on [email or web address]. If 7PM-7AM, please contact night-coverage at [email or web address]     05/03/2021, 12:31 PM

## 2021-05-03 NOTE — Progress Notes (Signed)
[facility] night shift PCU coverage note.  The patient wanted something to help him sleep tonight and for his p.o.  preoperative anxiety.  Alprazolam 0.5 mg p.o. nightly as needed x1 dose ordered.  Sanda Klein, MD.

## 2021-05-04 ENCOUNTER — Inpatient Hospital Stay (HOSPITAL_COMMUNITY): Payer: Medicare Other | Admitting: Anesthesiology

## 2021-05-04 ENCOUNTER — Encounter (HOSPITAL_COMMUNITY): Payer: Self-pay | Admitting: Cardiovascular Disease

## 2021-05-04 ENCOUNTER — Inpatient Hospital Stay (HOSPITAL_COMMUNITY): Payer: Medicare Other

## 2021-05-04 ENCOUNTER — Encounter (HOSPITAL_COMMUNITY)
Admission: EM | Disposition: A | Discharge status: Skilled Nursing Facility | Payer: Self-pay | Source: Home / Self Care | Attending: Internal Medicine

## 2021-05-04 DIAGNOSIS — L02619 Cutaneous abscess of unspecified foot: Secondary | ICD-10-CM | POA: Diagnosis not present

## 2021-05-04 DIAGNOSIS — I4891 Unspecified atrial fibrillation: Secondary | ICD-10-CM | POA: Diagnosis not present

## 2021-05-04 DIAGNOSIS — M86172 Other acute osteomyelitis, left ankle and foot: Secondary | ICD-10-CM | POA: Diagnosis not present

## 2021-05-04 DIAGNOSIS — I70245 Atherosclerosis of native arteries of left leg with ulceration of other part of foot: Secondary | ICD-10-CM

## 2021-05-04 DIAGNOSIS — L03119 Cellulitis of unspecified part of limb: Secondary | ICD-10-CM | POA: Diagnosis not present

## 2021-05-04 HISTORY — PX: BYPASS GRAFT FEMORAL-PERONEAL: SHX5762

## 2021-05-04 HISTORY — PX: AMPUTATION TOE: SHX6595

## 2021-05-04 HISTORY — PX: ENDOVEIN HARVEST OF GREATER SAPHENOUS VEIN: SHX5059

## 2021-05-04 HISTORY — PX: ENDARTERECTOMY FEMORAL: SHX5804

## 2021-05-04 HISTORY — PX: IRRIGATION AND DEBRIDEMENT FOOT: SHX6602

## 2021-05-04 HISTORY — PX: ENDARTERECTOMY TIBIOPERONEAL: SHX5808

## 2021-05-04 HISTORY — PX: INSERTION OF ILIAC STENT: SHX6256

## 2021-05-04 HISTORY — PX: INTRAOPERATIVE ARTERIOGRAM: SHX5157

## 2021-05-04 LAB — POCT I-STAT 7, (LYTES, BLD GAS, ICA,H+H)
Acid-base deficit: 1 mmol/L (ref 0.0–2.0)
Bicarbonate: 24.8 mmol/L (ref 20.0–28.0)
Calcium, Ion: 1.25 mmol/L (ref 1.15–1.40)
HCT: 27 % — ABNORMAL LOW (ref 39.0–52.0)
Hemoglobin: 9.2 g/dL — ABNORMAL LOW (ref 13.0–17.0)
O2 Saturation: 92 %
Patient temperature: 36.6
Potassium: 4.2 mmol/L (ref 3.5–5.1)
Sodium: 140 mmol/L (ref 135–145)
TCO2: 26 mmol/L (ref 22–32)
pCO2 arterial: 46.7 mmHg (ref 32.0–48.0)
pH, Arterial: 7.33 — ABNORMAL LOW (ref 7.350–7.450)
pO2, Arterial: 68 mmHg — ABNORMAL LOW (ref 83.0–108.0)

## 2021-05-04 LAB — POCT ACTIVATED CLOTTING TIME
Activated Clotting Time: 237 seconds
Activated Clotting Time: 236 seconds
Activated Clotting Time: 248 seconds

## 2021-05-04 LAB — GLUCOSE, CAPILLARY
Glucose-Capillary: 143 mg/dL — ABNORMAL HIGH (ref 70–99)
Glucose-Capillary: 138 mg/dL — ABNORMAL HIGH (ref 70–99)
Glucose-Capillary: 155 mg/dL — ABNORMAL HIGH (ref 70–99)
Glucose-Capillary: 161 mg/dL — ABNORMAL HIGH (ref 70–99)

## 2021-05-04 LAB — CBC
HCT: 28.1 % — ABNORMAL LOW (ref 39.0–52.0)
Hemoglobin: 9.3 g/dL — ABNORMAL LOW (ref 13.0–17.0)
MCH: 30.3 pg (ref 26.0–34.0)
MCHC: 33.1 g/dL (ref 30.0–36.0)
MCV: 91.5 fL (ref 80.0–100.0)
Platelets: 332 10*3/uL (ref 150–400)
RBC: 3.07 MIL/uL — ABNORMAL LOW (ref 4.22–5.81)
RDW: 13.3 % (ref 11.5–15.5)
WBC: 10.6 10*3/uL — ABNORMAL HIGH (ref 4.0–10.5)
nRBC: 0 % (ref 0.0–0.2)

## 2021-05-04 LAB — CREATININE, SERUM
Creatinine, Ser: 1.23 mg/dL (ref 0.61–1.24)
Creatinine, Ser: 1.07 mg/dL (ref 0.61–1.24)
GFR, Estimated: >60 mL/min (ref >60)
GFR, Estimated: >60 mL/min (ref >60)

## 2021-05-04 SURGERY — ENDARTERECTOMY, FEMORAL
Anesthesia: General | Site: Toe | Laterality: Left

## 2021-05-04 MED ORDER — EPHEDRINE SULFATE 50 MG/ML IJ SOLN
INTRAMUSCULAR | Status: DC | PRN
  Administered 2021-05-04 (×2): 5 mg via INTRAVENOUS

## 2021-05-04 MED ORDER — SUGAMMADEX SODIUM 200 MG/2ML IV SOLN
INTRAVENOUS | Status: DC | PRN
  Administered 2021-05-04: 400 mg via INTRAVENOUS

## 2021-05-04 MED ORDER — OXYCODONE HCL 5 MG/5ML PO SOLN: 5.0000 mg | Freq: Once | ORAL | Status: DC | PRN

## 2021-05-04 MED ORDER — LACTATED RINGERS IV SOLN: INTRAVENOUS | Status: DC | PRN

## 2021-05-04 MED ORDER — ALUM & MAG HYDROXIDE-SIMETH 200-200-20 MG/5ML PO SUSP: 15.0000 mL | ORAL | Status: DC | PRN

## 2021-05-04 MED ORDER — ORAL CARE MOUTH RINSE: 15.0000 mL | Freq: Once | OROMUCOSAL | Status: AC

## 2021-05-04 MED ORDER — FENTANYL CITRATE (PF) 100 MCG/2ML IJ SOLN
25.0000 ug | INTRAMUSCULAR | Status: DC | PRN
  Administered 2021-05-04 (×2): 50 ug via INTRAVENOUS

## 2021-05-04 MED ORDER — CHLORHEXIDINE GLUCONATE 0.12 % MT SOLN: 15.0000 mL | Freq: Once | OROMUCOSAL | Status: AC

## 2021-05-04 MED ORDER — ONDANSETRON HCL 4 MG/2ML IJ SOLN: 4.0000 mg | Freq: Once | INTRAMUSCULAR | Status: DC | PRN

## 2021-05-04 MED ORDER — HEPARIN SODIUM (PORCINE) 1000 UNIT/ML IJ SOLN
INTRAMUSCULAR | Status: AC
  Filled 2021-05-04: qty 1

## 2021-05-04 MED ORDER — PROPOFOL 10 MG/ML IV BOLUS
INTRAVENOUS | Status: DC | PRN
Start: 2021-05-04 — End: 2021-05-04
  Administered 2021-05-04: 120 mg via INTRAVENOUS

## 2021-05-04 MED ORDER — IODIXANOL 320 MG/ML IV SOLN
INTRAVENOUS | Status: DC | PRN
  Administered 2021-05-04: 70 mL via INTRA_ARTERIAL

## 2021-05-04 MED ORDER — LIDOCAINE 2% (20 MG/ML) 5 ML SYRINGE
INTRAMUSCULAR | Status: DC | PRN
  Administered 2021-05-04: 60 mg via INTRAVENOUS

## 2021-05-04 MED ORDER — EPHEDRINE 5 MG/ML INJ
INTRAVENOUS | Status: AC
  Filled 2021-05-04: qty 10

## 2021-05-04 MED ORDER — PROTAMINE SULFATE 10 MG/ML IV SOLN
INTRAVENOUS | Status: DC | PRN
  Administered 2021-05-04: 30 mg via INTRAVENOUS
  Administered 2021-05-04: 5 mg via INTRAVENOUS
  Administered 2021-05-04: 15 mg via INTRAVENOUS

## 2021-05-04 MED ORDER — HYDRALAZINE HCL 20 MG/ML IJ SOLN
5.0000 mg | INTRAMUSCULAR | Status: DC | PRN
Start: 2021-05-04 — End: 2021-05-09

## 2021-05-04 MED ORDER — PHENOL 1.4 % MT LIQD: 1.0000 | OROMUCOSAL | Status: DC | PRN

## 2021-05-04 MED ORDER — CHLORHEXIDINE GLUCONATE 0.12 % MT SOLN
OROMUCOSAL | Status: AC
  Administered 2021-05-04: 15 mL via OROMUCOSAL
  Filled 2021-05-04: qty 15

## 2021-05-04 MED ORDER — MAGNESIUM SULFATE 2 GM/50ML IV SOLN
2.0000 g | Freq: Every day | INTRAVENOUS | Status: DC | PRN
Start: 2021-05-04 — End: 2021-05-06

## 2021-05-04 MED ORDER — ONDANSETRON HCL 4 MG/2ML IJ SOLN
INTRAMUSCULAR | Status: DC | PRN
  Administered 2021-05-04: 4 mg via INTRAVENOUS

## 2021-05-04 MED ORDER — HEPARIN 6000 UNIT IRRIGATION SOLUTION
Status: DC | PRN
  Administered 2021-05-04: 1

## 2021-05-04 MED ORDER — POTASSIUM CHLORIDE CRYS ER 20 MEQ PO TBCR: 20.0000 meq | EXTENDED_RELEASE_TABLET | Freq: Every day | ORAL | Status: DC | PRN

## 2021-05-04 MED ORDER — HEPARIN SODIUM (PORCINE) 1000 UNIT/ML IJ SOLN
INTRAMUSCULAR | Status: DC | PRN
  Administered 2021-05-04: 10000 [IU] via INTRAVENOUS
  Administered 2021-05-04 (×2): 3000 [IU] via INTRAVENOUS

## 2021-05-04 MED ORDER — HYDROMORPHONE HCL 1 MG/ML IJ SOLN: 0.5000 mg | INTRAMUSCULAR | Status: DC | PRN

## 2021-05-04 MED ORDER — SODIUM CHLORIDE 0.9 % IV SOLN
INTRAVENOUS | Status: DC | PRN
  Administered 2021-05-04: 50 ug/min via INTRAVENOUS

## 2021-05-04 MED ORDER — FENTANYL CITRATE (PF) 250 MCG/5ML IJ SOLN
INTRAMUSCULAR | Status: DC | PRN
  Administered 2021-05-04 (×2): 50 ug via INTRAVENOUS
  Administered 2021-05-04: 100 ug via INTRAVENOUS
  Administered 2021-05-04: 50 ug via INTRAVENOUS

## 2021-05-04 MED ORDER — ROCURONIUM BROMIDE 10 MG/ML (PF) SYRINGE
PREFILLED_SYRINGE | INTRAVENOUS | Status: AC
  Filled 2021-05-04: qty 10

## 2021-05-04 MED ORDER — 0.9 % SODIUM CHLORIDE (POUR BTL) OPTIME
TOPICAL | Status: DC | PRN
  Administered 2021-05-04: 3000 mL

## 2021-05-04 MED ORDER — FENTANYL CITRATE (PF) 250 MCG/5ML IJ SOLN
INTRAMUSCULAR | Status: AC
  Filled 2021-05-04: qty 5

## 2021-05-04 MED ORDER — ALBUMIN HUMAN 5 % IV SOLN: INTRAVENOUS | Status: DC | PRN

## 2021-05-04 MED ORDER — OXYCODONE HCL 5 MG PO TABS: 5.0000 mg | ORAL_TABLET | Freq: Once | ORAL | Status: DC | PRN

## 2021-05-04 MED ORDER — HEPARIN 6000 UNIT IRRIGATION SOLUTION
Status: AC
  Filled 2021-05-04: qty 500

## 2021-05-04 MED ORDER — DOCUSATE SODIUM 100 MG PO CAPS
100.0000 mg | ORAL_CAPSULE | Freq: Every day | ORAL | Status: DC
  Administered 2021-05-05 – 2021-05-09 (×5): 100 mg via ORAL
  Filled 2021-05-04 (×5): qty 1

## 2021-05-04 MED ORDER — HYDROCODONE-ACETAMINOPHEN 5-325 MG PO TABS
1.0000 | ORAL_TABLET | ORAL | Status: DC | PRN
  Administered 2021-05-04 – 2021-05-05 (×2): 1 via ORAL
  Administered 2021-05-06: 2 via ORAL
  Administered 2021-05-08: 1 via ORAL
  Filled 2021-05-04: qty 1
  Filled 2021-05-04: qty 2
  Filled 2021-05-04: qty 1
  Filled 2021-05-04: qty 2
  Filled 2021-05-04: qty 1

## 2021-05-04 MED ORDER — LACTATED RINGERS IV SOLN: INTRAVENOUS | Status: DC

## 2021-05-04 MED ORDER — ROCURONIUM BROMIDE 10 MG/ML (PF) SYRINGE
PREFILLED_SYRINGE | INTRAVENOUS | Status: DC | PRN
  Administered 2021-05-04: 30 mg via INTRAVENOUS
  Administered 2021-05-04: 50 mg via INTRAVENOUS
  Administered 2021-05-04: 30 mg via INTRAVENOUS
  Administered 2021-05-04: 50 mg via INTRAVENOUS

## 2021-05-04 MED ORDER — SODIUM CHLORIDE 0.9 % IV SOLN: INTRAVENOUS | Status: DC

## 2021-05-04 MED ORDER — SODIUM CHLORIDE 0.9 % IV SOLN: 500.0000 mL | Freq: Once | INTRAVENOUS | Status: DC | PRN

## 2021-05-04 MED ORDER — FENTANYL CITRATE (PF) 100 MCG/2ML IJ SOLN
INTRAMUSCULAR | Status: AC
  Filled 2021-05-04: qty 2

## 2021-05-04 MED ORDER — PHENYLEPHRINE HCL (PRESSORS) 10 MG/ML IV SOLN
INTRAVENOUS | Status: DC | PRN
  Administered 2021-05-04 (×4): 80 ug via INTRAVENOUS

## 2021-05-04 MED ORDER — GUAIFENESIN-DM 100-10 MG/5ML PO SYRP: 15.0000 mL | ORAL_SOLUTION | ORAL | Status: DC | PRN

## 2021-05-04 SURGICAL SUPPLY — 96 items
ADH SKN CLS APL DERMABOND .7 (GAUZE/BANDAGES/DRESSINGS) ×3
AGENT HMST SPONGE THK3/8 (HEMOSTASIS)
BAG COUNTER SPONGE SURGICOUNT (BAG) ×4 IMPLANT
BAG SNAP BAND KOVER 36X36 (MISCELLANEOUS) ×1 IMPLANT
BAG SPNG CNTER NS LX DISP (BAG) ×3
BANDAGE ESMARK 6X9 LF (GAUZE/BANDAGES/DRESSINGS) IMPLANT
BLADE CLIPPER SURG (BLADE) ×4 IMPLANT
BNDG CMPR 9X6 STRL LF SNTH (GAUZE/BANDAGES/DRESSINGS) ×3
BNDG ELASTIC 4X5.8 VLCR STR LF (GAUZE/BANDAGES/DRESSINGS) ×1 IMPLANT
BNDG ESMARK 6X9 LF (GAUZE/BANDAGES/DRESSINGS) ×4
BNDG GAUZE ELAST 4 BULKY (GAUZE/BANDAGES/DRESSINGS) ×1 IMPLANT
CANISTER SUCT 3000ML PPV (MISCELLANEOUS) ×4 IMPLANT
CANNULA VESSEL 3MM 2 BLNT TIP (CANNULA) ×1 IMPLANT
CATH BEACON 5 .035 65 KMP TIP (CATHETERS) ×1 IMPLANT
CATH OMNI FLUSH 5F 65CM (CATHETERS) ×4 IMPLANT
CLIP VESOCCLUDE MED 24/CT (CLIP) ×4 IMPLANT
CLIP VESOCCLUDE SM WIDE 24/CT (CLIP) ×4 IMPLANT
COVER BACK TABLE 60X90IN (DRAPES) ×1 IMPLANT
COVER DOME SNAP 22 D (MISCELLANEOUS) ×1 IMPLANT
COVER PROBE W GEL 5X96 (DRAPES) ×4 IMPLANT
CUFF TOURN SGL QUICK 18X4 (TOURNIQUET CUFF) IMPLANT
CUFF TOURN SGL QUICK 24 (TOURNIQUET CUFF)
CUFF TOURN SGL QUICK 34 (TOURNIQUET CUFF) ×4
CUFF TOURN SGL QUICK 42 (TOURNIQUET CUFF) IMPLANT
CUFF TRNQT CYL 24X4X16.5-23 (TOURNIQUET CUFF) IMPLANT
CUFF TRNQT CYL 34X4.125X (TOURNIQUET CUFF) IMPLANT
CUFF TRNQT CYL 34X4X40X1 (TOURNIQUET CUFF) IMPLANT
DERMABOND ADVANCED (GAUZE/BANDAGES/DRESSINGS) ×1
DERMABOND ADVANCED .7 DNX12 (GAUZE/BANDAGES/DRESSINGS) ×3 IMPLANT
DRAIN CHANNEL 15F RND FF W/TCR (WOUND CARE) IMPLANT
DRAPE C-ARM 42X72 X-RAY (DRAPES) IMPLANT
ELECT REM PT RETURN 9FT ADLT (ELECTROSURGICAL) ×4
ELECTRODE REM PT RTRN 9FT ADLT (ELECTROSURGICAL) ×3 IMPLANT
EVACUATOR SILICONE 100CC (DRAIN) IMPLANT
GAUZE 4X4 16PLY ~~LOC~~+RFID DBL (SPONGE) ×1 IMPLANT
GAUZE SPONGE 4X4 12PLY STRL (GAUZE/BANDAGES/DRESSINGS) ×1 IMPLANT
GLOVE SRG 8 PF TXTR STRL LF DI (GLOVE) ×3 IMPLANT
GLOVE SURG ENC MOIS LTX SZ7 (GLOVE) ×2 IMPLANT
GLOVE SURG ENC MOIS LTX SZ7.5 (GLOVE) ×5 IMPLANT
GLOVE SURG MICRO LTX SZ7.5 (GLOVE) ×3 IMPLANT
GLOVE SURG UNDER POLY LF SZ6.5 (GLOVE) ×7 IMPLANT
GLOVE SURG UNDER POLY LF SZ8 (GLOVE) ×12
GOWN STRL REUS W/ TWL LRG LVL3 (GOWN DISPOSABLE) ×9 IMPLANT
GOWN STRL REUS W/ TWL XL LVL3 (GOWN DISPOSABLE) ×6 IMPLANT
GOWN STRL REUS W/TWL 2XL LVL3 (GOWN DISPOSABLE) ×2 IMPLANT
GOWN STRL REUS W/TWL LRG LVL3 (GOWN DISPOSABLE) ×16
GOWN STRL REUS W/TWL XL LVL3 (GOWN DISPOSABLE) ×8
HEMOSTAT SPONGE AVITENE ULTRA (HEMOSTASIS) IMPLANT
INSERT FOGARTY SM (MISCELLANEOUS) IMPLANT
KIT BASIN OR (CUSTOM PROCEDURE TRAY) ×4 IMPLANT
KIT ENCORE 26 ADVANTAGE (KITS) ×1 IMPLANT
KIT TURNOVER KIT B (KITS) ×4 IMPLANT
LOOP VESSEL MINI RED (MISCELLANEOUS) ×1 IMPLANT
NS IRRIG 1000ML POUR BTL (IV SOLUTION) ×8 IMPLANT
PACK ENDO MINOR (CUSTOM PROCEDURE TRAY) ×4 IMPLANT
PACK PERIPHERAL VASCULAR (CUSTOM PROCEDURE TRAY) ×4 IMPLANT
PAD ARMBOARD 7.5X6 YLW CONV (MISCELLANEOUS) ×8 IMPLANT
PADDING CAST COTTON 6X4 STRL (CAST SUPPLIES) ×1 IMPLANT
PATCH VASC XENOSURE 1CMX6CM (Vascular Products) ×4 IMPLANT
PATCH VASC XENOSURE 1X6 (Vascular Products) IMPLANT
SET MICROPUNCTURE 5F STIFF (MISCELLANEOUS) ×1 IMPLANT
SHEATH BRITE TIP 7FR 35CM (SHEATH) ×1 IMPLANT
SHEATH BRITE TIP 7FRX11 (SHEATH) ×1 IMPLANT
SHEATH PINNACLE 5F 10CM (SHEATH) ×4 IMPLANT
SHEATH PINNACLE 8F 10CM (SHEATH) ×4 IMPLANT
SPONGE T-LAP 18X18 ~~LOC~~+RFID (SPONGE) ×3 IMPLANT
STAPLER VISISTAT 35W (STAPLE) IMPLANT
STENT VIABAHNBX 8X29X135 (Permanent Stent) ×1 IMPLANT
SUT GORETEX 5 0 TT13 24 (SUTURE) IMPLANT
SUT GORETEX 6.0 TT13 (SUTURE) IMPLANT
SUT MNCRL AB 4-0 PS2 18 (SUTURE) ×11 IMPLANT
SUT PROLENE 5 0 C 1 24 (SUTURE) ×7 IMPLANT
SUT PROLENE 6 0 BV (SUTURE) ×7 IMPLANT
SUT PROLENE 7 0 BV 1 (SUTURE) IMPLANT
SUT SILK 2 0 PERMA HAND 18 BK (SUTURE) IMPLANT
SUT SILK 2 0 SH (SUTURE) ×4 IMPLANT
SUT SILK 3 0 (SUTURE) ×4
SUT SILK 3-0 18XBRD TIE 12 (SUTURE) IMPLANT
SUT VIC AB 2-0 CT1 27 (SUTURE) ×8
SUT VIC AB 2-0 CT1 TAPERPNT 27 (SUTURE) ×6 IMPLANT
SUT VIC AB 3-0 SH 27 (SUTURE) ×20
SUT VIC AB 3-0 SH 27X BRD (SUTURE) ×6 IMPLANT
SYR 10ML LL (SYRINGE) ×3 IMPLANT
SYR 20ML LL LF (SYRINGE) ×1 IMPLANT
SYR MEDRAD MARK V 150ML (SYRINGE) ×5 IMPLANT
TOWEL GREEN STERILE (TOWEL DISPOSABLE) ×4 IMPLANT
TOWEL GREEN STERILE FF (TOWEL DISPOSABLE) ×3 IMPLANT
TRAY FOLEY MTR SLVR 16FR STAT (SET/KITS/TRAYS/PACK) ×4 IMPLANT
TUBING EXTENTION W/L.L. (IV SETS) ×3 IMPLANT
TUBING HIGH PRESSURE 120CM (CONNECTOR) IMPLANT
UNDERPAD 30X36 HEAVY ABSORB (UNDERPADS AND DIAPERS) ×4 IMPLANT
WATER STERILE IRR 1000ML POUR (IV SOLUTION) ×4 IMPLANT
WIRE AMPLATZ SS-J .035X180CM (WIRE) IMPLANT
WIRE AMPLATZ SS-J .035X260CM (WIRE) ×1 IMPLANT
WIRE BENTSON .035X145CM (WIRE) ×4 IMPLANT
WIRE ROSEN-J .035X260CM (WIRE) ×1 IMPLANT

## 2021-05-04 NOTE — Op Note (Addendum)
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  Harvest of left leg great saphenous vein 2.  Aortogram with left iliac arteriogram 3.  Left common iliac artery angioplasty with stent placement (8 mm x 29 mm VBX) 4.  Left common femoral endarterectomy with profundoplasty and bovine pericardial patch angioplasty 5.  Left below-knee popliteal artery and TP trunk endarterectomy 6.  Left common femoral to TP trunk bypass with ipsilateral non-reversed great saphenous vein 7.  Ray amputation of left great toe 8.  Debridement of left foot infection with dorsal foot abscess  Surgeon: Dr. [name], MD  Assistant: Mosetta Pigeon, PA  Indications: Patient is a 75 year old male who was seen in consultation last week as a transfer from [facility] with a left toe ulceration and monophasic waveforms at the ankle.  He underwent aortogram with lower extremity arteriogram last week and was found to have multi level disease.  He presents today for planned iliac artery angioplasty with common femoral endarterectomy and a left fem distal bypass as well as left great toe amputation.  Risk benefits discussed.  An assistant was needed for exposure and to expedite the case.  Findings: Left great saphenous vein was harvested from the saphenofemoral junction all the way down to the mid calf and was of good caliber.  The common femoral artery was circumferentially calcified and there was no soft spot in the artery for anastomosis.  Ultimately the common femoral was opened and I was able to get a wire up through the true lumen and across the left common iliac high-grade stenosis retrograde.  The left common iliac artery had >80% stenosis that was stented with an 8 mm x 29 mm VBX.   Endarterectomy was then performed of the common femoral with endarterectomy of the profunda as well.  We then had excellent inflow and a bovine pericardial patch was sewn with good femoral pulse.  I then sewed the great saphenous vein in non reversed fashion from the common femoral patch in the left groin tunneled subfascial subsartorial to the TP trunk.  Ultimately had to endarterectomize the distal below knee popliteal artery and TP trunk in order to find a spot for anastomosis.  At completion there was a brisk PT signal that augmented when the bypass was patent.  Once all the incisions were closed and the clean portion of the case was done we proceeded by performing ray amputation of the left great toe.  There was a abscess in the dorsum of the foot.  Cultures were sent of the metatarsal head as well as the abscess cavity.  The wound was left open with wet-to-dry packing.  Anesthesia: General  EBL: 600 mL  Details: Patient was taken to the operating room after informed consent was obtained.  Placed on operative table supine position.  General endotracheal anesthesia was induced.  I then used the ultrasound probe and marked the great saphenous vein from the saphenofemoral junction down to the mid calf where it appeared to be of good caliber.  The left groin and left leg were then prepped and draped in standard sterile fashion.  Initially started with a horizontal incision in the left groin just above the inguinal crease.  Dissected down to the common femoral artery as well as SFA and profunda.  This was heavily  calcified and there was no soft spot on the anterior wall the artery for anastomosis.  All branches were controlled including external iliac artery.  I then went down and performed 3 skip incisions in the left leg and the great saphenous vein was harvested throughout its length including between the skin tunnels and all side branches were ligated with 3-0 silk ties and small clips and divided.  The  vein was then reversed pressurized and several side branches were repaired with 6-0 Prolene.  I then exposed the below-knee popliteal artery by entering the popliteal space.  I used Meyerding retractors for added visualization.  The below-knee popliteal artery was heavily diseased on arteriogram and ultimately I dissected this down to the TP trunk.  The anterior tibial vein was divided.  Some of the soleus muscle was resected for added visualization.  I brought a tunneler on the field and tunneled from the below-knee popliteal space up to the groin in subfascial subsartorial plane.  Patient was given 100 units per kilogram IV heparin.  I then controlled the SFA and profunda with Vesseloops and also used a vessel loop for the distal external iliac artery.  Opened the common femoral with 11 blade scalpel extended with Potts scissors.  I could visualize the true lumen and ultimately able to pass a Bentson wire up through the disease iliac artery retrograde and across his left common iliac high-grade stenosis.  I then placed a 7 [place] long sheath.  We confirmed the lesion in the proximal left common iliac artery.  This was then primarily stented with an 8 x 29 VBX with excellent results and no residual stenosis.  I then endarterectomized the distal external iliac common femoral profunda and proximal SFA.  I then had excellent inflow after stenting of the left common iliac.  Bovine pericardial patch was sewn with 5-0 Prolene parachute technique to the left common femoral arteriotomy.  We had good hemostasis after the artery was flushed.  The saphenous vein that was harvested was brought on the field in non-reversed fashion.  The vein was spatulated and I used a Cooley clamp to get control the patch.  I then opened the patch with 11 blade scalpel extended with Pott scissors and then a end-to-side anastomosis was sewn to the left bovine patch with 5-0 Prolene parachute technique.  Given the valves had not been lysed we  had no pulsatile flow distally.  I then used a valvulotome and carefully lysed all the valves under direct visualization.  We had excellent pulsatile flow through the vein graft.  2 medium clips were placed on the distal vein graft and the vein was marked.  It then passed the vein through the tunneler maintaining a proper orientation.  The leg was then straightened and initially planned to sew to the TP trunk based on arteriogram imaging.  This was very calcified and heavily diseased and I tried to use Vesseloops for control but when I opened the artery had no good control.  We then used the tourniquet after using an Esmarch on the leg and this was inflated 250 mmHg.  I then endarterectomized the distal popliteal artery onto the TP trunk and the vein was then spatulated and end-to-side anastomosis was sewn to the TP trunk with 6-0 Prolene parachute technique.  We de-aired everything prior to completion.  Excellent signal in the bypass as well as the TP trunk distally and PT at the ankle that augmented only when the bypass was open.  Protamine was given  for reversal.  All incisions were irrigated out.  We closed the groin and leg incisions multiple layers of 2-0 Vicryl 3-0 Vicryl 4-0 Monocryl Dermabond.  Once the sterile portion of the procedure was done we turned our attention to the left foot.  I then made a tennis racquet incision over the left great toe.  Ultimately the toe was transected off the head of the metatarsal.  I got into a abscess in the dorsum of the foot.  Cultures were then sent of this for Gram stain as well as aerobic and anaerobic cultures.  I did take some of the metatarsal head back with a rongeur and those were also sent for culture.  I then debrided all of the dorsal abscess cavity and the wound measured 4 cm x 6 cm x 2 cm.  This was irrigated out.  Excellent bleeding.  This was packed with saline soaked Kerlix and dry sterile dressings for hemostasis.  Awakened from anesthesia and taken to  recovery.  Complication: None  Condition: Stable  [name], MD [facility] Office: [phone]   [name]

## 2021-05-04 NOTE — Anesthesia Preprocedure Evaluation (Addendum)
Anesthesia Evaluation  Patient identified by MRN, date of birth, ID band Patient awake    Reviewed: Allergy & Precautions, NPO status , Patient's Chart, lab work & pertinent test results, reviewed documented beta blocker date and time   History of Anesthesia Complications Negative for: history of anesthetic complications  Airway Mallampati: III  TM Distance: >3 FB Neck ROM: Full    Dental  (+) Dental Advisory Given, Poor Dentition   Pulmonary COPD,  COPD inhaler, former smoker,    Pulmonary exam normal        Cardiovascular hypertension, Pt. on medications and Pt. on home beta blockers + Peripheral Vascular Disease  + dysrhythmias Atrial Fibrillation  Rhythm:Irregular Rate:Normal   '22 TEE - EF 60 to 65%. Mild mitral valve regurgitation.  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 Functionally bicuspid. Aortic valve regurgitation is not  visualized. Mild to moderate aortic valve sclerosis / calcification is present, without any evidence of aortic stenosis. Aortic valve mean gradient measures 8.0 mmHg. Aortic valve Vmax measures 1.96 m/s. 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     Neuro/Psych PSYCHIATRIC DISORDERS Anxiety negative neurological ROS     GI/Hepatic Neg liver ROS, GERD  Medicated and Controlled,  Endo/Other  diabetes, Type 2, Oral Hypoglycemic Agents, Insulin Dependent Obesity   Renal/GU CRFRenal disease     Musculoskeletal  (+) Arthritis ,   Abdominal   Peds  Hematology  (+) anemia ,  INR 1.4    Anesthesia Other Findings   Reproductive/Obstetrics                            Anesthesia Physical Anesthesia Plan  ASA: 3  Anesthesia Plan: General   Post-op Pain Management:    Induction: Intravenous  PONV Risk Score and Plan: 3 and Treatment may vary due to age or medical condition, Ondansetron and Dexamethasone  Airway  Management Planned: Oral ETT  Additional Equipment: Arterial line  Intra-op Plan:   Post-operative Plan: Extubation in OR  Informed Consent: I have reviewed the patients History and Physical, chart, labs and discussed the procedure including the risks, benefits and alternatives for the proposed anesthesia with the patient or authorized representative who has indicated his/her understanding and acceptance.     Dental advisory given  Plan Discussed with: CRNA and Anesthesiologist  Anesthesia Plan Comments:        Anesthesia Quick Evaluation

## 2021-05-04 NOTE — Care Management Important Message (Signed)
Important Message  Patient Details  Name: Todd Zhang MRN: [id number] Date of Birth: 10/09/1945   Medicare Important Message Given:  Yes     Renie Ora 05/04/2021, 10:41 AM

## 2021-05-04 NOTE — Transfer of Care (Signed)
Immediate Anesthesia Transfer of Care Note  Patient: Todd Zhang  Procedure(s) Performed: LEFT COMMON FEMORAL ENDARTERECTOMY WITH BOVINE PATCH (Left: Leg Upper) LEFT COMMON FEMORAL-TIBIOPERONEAL TRUNK BYPASS (Left: Leg Upper) LEFT COMMON ILLIAC ATERY ANGIOPLASTY WITH STENT (Left: Leg Upper) LEFT GREAT TOE AMPUTATION (Left: Toe) PROFUNDOPLASTY (Left: Leg Upper) HARVEST OF LEFT GREATER SAPHENOUS VEIN (Leg Upper) LEFT ARTERY ARTERIOGRAM (Left: Leg Upper) LEFT BELOW KNEE POPITEAL WITH  TIBIOPERONEAL ENDARTERECTOMY (Left: Leg Upper) IRRIGATION AND DEBRIDEMENT FOOT (Left: Toe)  Patient Location: PACU  Anesthesia Type:General  Level of Consciousness: awake, alert , oriented and patient cooperative  Airway & Oxygen Therapy: Patient Spontanous Breathing and Patient connected to face mask oxygen  Post-op Assessment: Report given to RN, Post -op Vital signs reviewed and stable and Patient moving all extremities X 4  Post vital signs: Reviewed and stable  Last Vitals:  Vitals Value Taken Time  BP 129/73 05/04/21 1829  Temp 97.5   Pulse 89 05/04/21 1830  Resp 25 05/04/21 1830  SpO2 91 % 05/04/21 1830  Vitals shown include unvalidated device data.  Last Pain:  Vitals:   05/04/21 1129  TempSrc: Oral  PainSc:       Patients Stated Pain Goal: 0 (05/01/21 0405)  Complications: No notable events documented.

## 2021-05-04 NOTE — Progress Notes (Addendum)
Progress Note    Todd Zhang  ZOX:[id number] DOB: [birth year]  DOA: 04/24/2021 PCP: Bennie PieriniMartin, [name], FNP      Brief Narrative:    Medical records reviewed and are as summarized below:  Todd Zhang is a 75 y.o. male medical history of diabetes mellitus, hypertension, hyperlipidemia, COPD, anxiety, GERD, [facility]21/2020 for confusion, who presented to the hospital with 1 day history of left flank pain and 2-week history of left foot pain.  He also complained of multiple falls secondary to unsteady gait.      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  (Obesity)   Severe sepsis secondary to acute osteomyelitis left great toe, diabetic foot infection, group B strep bacteremia: S/p aortogram which showed complex PVD.  Plan for vascular surgery today.  Continue IV Rocephin. 2D echo did not show any evidence of vegetation, EF was estimated at 60 to 65%.  No evidence of vegetation or cardiac thrombus on TEE that was done on 05/01/2021.  Atrial fibrillation with RVR: Continue metoprolol and Cardizem.  Xarelto on hold for PVD surgery today.  Type 2 diabetes mellitus with hyperglycemia: Hemoglobin A1c 8.3.  Continue NovoLog as needed for hyperglycemia.  COPD, suspect chronic hypoxic respiratory failure: He has intermittent hypoxia on room air.  Plan to repeat pulse oximetry with ambulation prior to discharge.  He may need home oxygen.  Hyponatremia: Improved   Other comorbidities include CKD stage IIIb, hypertension, hyperlipidemia  Benazepril and metformin have been held.    Diet Order              Diet NPO time specified  Diet effective midnight                      Consultants: Infectious disease Vascular surgeon  Procedures: Aortogram on 04/30/2021    Medications:    atorvastatin  40 mg Oral Daily   diltiazem  240 mg Oral Daily   gabapentin  300 mg Oral TID   guaiFENesin  1,200 mg Oral BID   heparin  5,000 Units Subcutaneous Q8H   insulin aspart  0-5 Units Subcutaneous QHS   insulin aspart  0-9 Units Subcutaneous TID WC   loratadine  10 mg Oral Daily   metoprolol succinate  100 mg Oral Daily   multivitamin with minerals  1 tablet Oral Daily   pantoprazole  40 mg Oral Daily   sodium chloride flush  3 mL Intravenous Q12H   tamsulosin  0.4 mg Oral Daily   vitamin B-12  500 mcg Oral Daily   Continuous Infusions:  sodium chloride 100 mL/hr at 05/02/21 1322   cefTRIAXone (ROCEPHIN)  IV 2 g (05/03/21 1404)     Anti-infectives (From admission, onward)    Start     Dose/Rate Route Frequency Ordered Stop   04/28/21 1200  cefTRIAXone (ROCEPHIN) 2 g in sodium chloride 0.9 % 100 mL IVPB       Note to Pharmacy: Patient tolerated cephalexin in the past   2 g 200 mL/hr  over 30 Minutes Intravenous Every 24 hours 04/28/21 0958     04/27/21 1200  meropenem (MERREM) 1 g in sodium chloride 0.9 % 100 mL IVPB  Status:  Discontinued        1 g 200 mL/hr over 30 Minutes Intravenous Every 8 hours 04/27/21 0845 04/28/21 0957   04/25/21 1500  vancomycin (VANCOREADY) IVPB 1250 mg/250 mL  Status:  Discontinued        1,250 mg 166.7 mL/hr over 90 Minutes Intravenous Every 24 hours 04/24/21 1348 04/27/21 0845   04/24/21 2000  meropenem (MERREM) 500 mg in sodium chloride 0.9 % 100 mL IVPB  Status:  Discontinued        500 mg 200 mL/hr over 30 Minutes Intravenous Every 8 hours 04/24/21 1337 04/27/21 0845   04/24/21 1430  vancomycin (VANCOREADY) IVPB 2000 mg/400 mL        2,000 mg 200 mL/hr over 120 Minutes Intravenous  Once 04/24/21 1345 04/24/21 1819   04/24/21  1115  ciprofloxacin (CIPRO) IVPB 400 mg        400 mg 200 mL/hr over 60 Minutes Intravenous  Once 04/24/21 1100 04/24/21 1340   04/24/21 1115  clindamycin (CLEOCIN) IVPB 600 mg        600 mg 100 mL/hr over 30 Minutes Intravenous  Once 04/24/21 1100 04/24/21 1226              Family Communication/Anticipated D/C date and plan/Code Status   DVT prophylaxis: heparin injection 5,000 Units Start: 04/30/21 2200 SCD's Start: 04/30/21 1433     Code Status: Full Code  Family Communication:  None Disposition Plan:    Status is: Inpatient  Remains inpatient appropriate because:IV treatments appropriate due to intensity of illness or inability to take PO and Inpatient level of care appropriate due to severity of illness  Dispo: The patient is from: Home              Anticipated d/c is to: Home              Patient currently is not medically stable to d/c.   Difficult to place patient No           Subjective:   Interval events noted.  Ativan helped him to relax and get some sleep last night.  He still has pain in the left foot.  He nurse was at  the bedside.  Objective:    Vitals:   05/03/21 1102 05/03/21 1710 05/04/21 0518 05/04/21 0809  BP: (!) 163/93 137/73 (!) 158/90 130/78  Pulse: 96 78 86 81  Resp: 18 18 19  (!) 24  Temp: 98.1 F (36.7 C) 98.9 F (37.2 C) 98 F (36.7 C) 98.1 F (36.7 C)  TempSrc: Oral Oral Oral Oral  SpO2: 98% 98% (!) 89% 90%  Weight:      Height:       No data found.   Intake/Output Summary (Last 24 hours) at 05/04/2021 0824 Last data filed at 05/04/2021 0810 Gross per 24 hour  Intake 960 ml  Output 1400 ml  Net -440 ml   Filed Weights   04/25/21 2002 04/30/21 1432 05/01/21 0834  Weight: 102.6 kg 100.4 kg 100.4 kg    Exam:  GEN: NAD SKIN: No rash EYES: No pallor or icterus ENT: MMM CV: RRR PULM: CTA B ABD: soft, ND, NT, +BS CNS: AAO x 3, non focal EXT: Erythema, mild swelling and tenderness of proximal left foot,  left big toe.  Wound on the left  big toe without any drainage.               Data Reviewed:   I have personally reviewed following labs and imaging studies:  Labs: Labs show the following:   Basic Metabolic Panel: Recent Labs  Lab 04/28/21 0749 04/29/21 0630 04/30/21 0250 05/01/21 0603  NA 134* 132* 134* 136  K 3.8 3.6 3.5 3.7  CL 100 101 100 105  CO2 GLUCOSE 199* 193* 138* 140*  BUN CREATININE 1.17 1.28* 1.30* 1.10  CALCIUM 8.4* 8.3* 8.6* 8.1*  MG 1.6* 1.6*  --   --   PHOS 2.4* 2.7  --   --    GFR Estimated Creatinine Clearance: 71.2 mL/min (by C-G formula based on SCr of 1.1 mg/dL). Liver Function Tests: Recent Labs  Lab 04/29/21 0630 04/30/21 0250 05/01/21 0603  AST 64* 89* 49*  ALT 51* 68* 56*  ALKPHOS 91 78 81  BILITOT 0.7 0.4 0.4  PROT 5.9* 5.7* 5.5*  ALBUMIN 2.1* 2.0* 1.9*   No results for input(s): LIPASE, AMYLASE in the last 168 hours. No results for input(s): AMMONIA in the last 168 hours. Coagulation profile No results for input(s): INR, PROTIME in the last 168 hours.   CBC: Recent Labs  Lab 04/28/21 0749 04/28/21 1633 04/29/21 0630 04/30/21 0250  WBC 9.1 9.2 11.0* 10.0  NEUTROABS  --  7.4 9.1* 7.4  HGB 11.3* 10.5* 10.9* 10.8*  HCT 32.6* 30.0* 31.4* 31.5*  MCV 88.8 89.0 88.2 89.0  PLT 269 280 287 308   Cardiac Enzymes: No results for input(s): CKTOTAL, CKMB, CKMBINDEX, TROPONINI in the last 168 hours. BNP (last 3 results) No results for input(s): PROBNP in the last 8760 hours. CBG: Recent Labs  Lab 05/03/21 0613 05/03/21 1203 05/03/21 1708 05/03/21 2130 05/04/21 0526  GLUCAP 130* 149* 210* 287* 143*   D-Dimer: No results for input(s): DDIMER in the last 72 hours. Hgb A1c: No results for input(s): HGBA1C in the last 72 hours. Lipid Profile: No results for input(s): CHOL, HDL, LDLCALC, TRIG, CHOLHDL, LDLDIRECT in the last 72 hours. Thyroid function studies: No results for input(s): TSH,  T4TOTAL, T3FREE, THYROIDAB in the last 72 hours.  Invalid input(s): FREET3 Anemia work up: No results for input(s): VITAMINB12, FOLATE, FERRITIN, TIBC, IRON, RETICCTPCT in the last 72 hours. Sepsis Labs: Recent Labs  Lab 04/28/21 0749 04/28/21 1633 04/29/21 0630 04/30/21 0250  WBC 9.1 9.2 11.0* 10.0    Microbiology Recent Results (from the past 240 hour(s))  Blood culture (routine single)     Status: Abnormal   Collection Time: 04/24/21 10:42 AM   Specimen: Right Antecubital; Blood  Result Value Ref Range Status   Specimen Description   Final    RIGHT ANTECUBITAL Performed at [facility], [address], [place], [place] [address]    Special Requests   Final    BOTTLES DRAWN AEROBIC AND ANAEROBIC Blood Culture adequate volume Performed at [facility], [address], [place], [place] [address]    Culture  Setup Time   Final    GRAM POSITIVE COCCI Gram Stain Report Called to,Read Back By and Verified With: BRAME,M @ 2048 04/24/21 BY STEPHTR IN BOTH AEROBIC AND ANAEROBIC BOTTLES CRITICAL RESULT CALLED TO, READ BACK BY AND VERIFIED WITH: RN N.MOODY AT 0146 ON 04/25/2021 BY T.SAAD. Performed at [facility], [address], [place], [place] [address]    Culture GROUP B STREP(S.AGALACTIAE)ISOLATED (A)  Final  Report Status 04/27/2021 FINAL  Final   Organism ID, Bacteria GROUP B STREP(S.AGALACTIAE)ISOLATED  Final      Susceptibility   Group b strep(s.agalactiae)isolated - MIC*    CLINDAMYCIN >=1 RESISTANT Resistant     AMPICILLIN <=0.25 SENSITIVE Sensitive     ERYTHROMYCIN >=8 RESISTANT Resistant     VANCOMYCIN 0.5 SENSITIVE Sensitive     CEFTRIAXONE <=0.12 SENSITIVE Sensitive     LEVOFLOXACIN 0.5 SENSITIVE Sensitive     PENICILLIN Value in next row Sensitive      SENSITIVE0.06    * GROUP B STREP(S.AGALACTIAE)ISOLATED  Blood Culture ID Panel (Reflexed)     Status: Abnormal   Collection Time: 04/24/21 10:42 AM  Result Value Ref Range Status   Enterococcus faecalis  NOT DETECTED NOT DETECTED Final   Enterococcus Faecium NOT DETECTED NOT DETECTED Final   Listeria monocytogenes NOT DETECTED NOT DETECTED Final   Staphylococcus species NOT DETECTED NOT DETECTED Final   Staphylococcus aureus (BCID) NOT DETECTED NOT DETECTED Final   Staphylococcus epidermidis NOT DETECTED NOT DETECTED Final   Staphylococcus lugdunensis NOT DETECTED NOT DETECTED Final   Streptococcus species DETECTED (A) NOT DETECTED Final    Comment: CRITICAL RESULT CALLED TO, READ BACK BY AND VERIFIED WITH: RN N.MOODY AT 0146 ON 04/25/2021 BY T.SAAD.    Streptococcus agalactiae DETECTED (A) NOT DETECTED Final    Comment: CRITICAL RESULT CALLED TO, READ BACK BY AND VERIFIED WITH: RN N.MOODY AT 0146 ON 04/25/2021 BY T.SAAD.    Streptococcus pneumoniae NOT DETECTED NOT DETECTED Final   Streptococcus pyogenes NOT DETECTED NOT DETECTED Final   A.calcoaceticus-baumannii NOT DETECTED NOT DETECTED Final   Bacteroides fragilis NOT DETECTED NOT DETECTED Final   Enterobacterales NOT DETECTED NOT DETECTED Final   Enterobacter cloacae complex NOT DETECTED NOT DETECTED Final   Escherichia coli NOT DETECTED NOT DETECTED Final   Klebsiella aerogenes NOT DETECTED NOT DETECTED Final   Klebsiella oxytoca NOT DETECTED NOT DETECTED Final   Klebsiella pneumoniae NOT DETECTED NOT DETECTED Final   Proteus species NOT DETECTED NOT DETECTED Final   Salmonella species NOT DETECTED NOT DETECTED Final   Serratia marcescens NOT DETECTED NOT DETECTED Final   Haemophilus influenzae NOT DETECTED NOT DETECTED Final   Neisseria meningitidis NOT DETECTED NOT DETECTED Final   Pseudomonas aeruginosa NOT DETECTED NOT DETECTED Final   Stenotrophomonas maltophilia NOT DETECTED NOT DETECTED Final   Candida albicans NOT DETECTED NOT DETECTED Final   Candida auris NOT DETECTED NOT DETECTED Final   Candida glabrata NOT DETECTED NOT DETECTED Final   Candida krusei NOT DETECTED NOT DETECTED Final   Candida parapsilosis NOT  DETECTED NOT DETECTED Final   Candida tropicalis NOT DETECTED NOT DETECTED Final   Cryptococcus neoformans/gattii NOT DETECTED NOT DETECTED Final    Comment: Performed at [facility], [address] [place], [place] [address]  Urine Culture     Status: Abnormal   Collection Time: 04/24/21 11:20 AM   Specimen: In/Out Cath Urine  Result Value Ref Range Status   Specimen Description   Final    IN/OUT CATH URINE Performed at [facility], [address], [place], [place] [address]    Special Requests   Final    NONE Performed at [facility], [address], [place], [place] [address]    Culture (A)  Final    10,000 COLONIES/mL GROUP B STREP(S.AGALACTIAE)ISOLATED TESTING AGAINST S. AGALACTIAE NOT ROUTINELY PERFORMED DUE TO PREDICTABILITY OF AMP/PEN/VAN SUSCEPTIBILITY. Performed at [facility], [address] [place],  [place] [address]    Report Status 04/25/2021 FINAL  Final  Resp Panel by RT-PCR (Flu A&B, Covid) Nasopharyngeal Swab     Status: None   Collection Time: 04/24/21  1:15 PM   Specimen: Nasopharyngeal Swab; Nasopharyngeal(NP) swabs in vial transport medium  Result Value Ref Range Status   SARS Coronavirus 2 by RT PCR NEGATIVE NEGATIVE Final    Comment: (NOTE) SARS-CoV-2 target nucleic acids are NOT DETECTED.  The SARS-CoV-2 RNA is generally detectable in upper respiratory specimens during the acute phase of infection. The lowest concentration of SARS-CoV-2 viral copies this assay can detect is 138 copies/mL. A negative result does not preclude SARS-Cov-2 infection and should not be used as the sole basis for treatment or other patient management decisions. A negative result may occur with  improper specimen collection/handling, submission of specimen other than nasopharyngeal swab, presence of viral mutation(s) within the areas targeted by this assay, and inadequate number of viral copies(<138 copies/mL). A negative result must be combined with clinical  observations, patient history, and epidemiological information. The expected result is Negative.  Fact Sheet for Patients:  BloggerCourse.com  Fact Sheet for Healthcare Providers:  SeriousBroker.it  This test is no t yet approved or cleared by the [place] FDA and  has been authorized for detection and/or diagnosis of SARS-CoV-2 by FDA under an Emergency Use Authorization (EUA). This EUA will remain  in effect (meaning this test can be used) for the duration of the COVID-19 declaration under Section 564(b)(1) of the Act, 21 U.S.C.section 360bbb-3(b)(1), unless the authorization is terminated  or revoked sooner.       Influenza A by PCR NEGATIVE NEGATIVE Final   Influenza B by PCR NEGATIVE NEGATIVE Final    Comment: (NOTE) The Xpert Xpress SARS-CoV-2/FLU/RSV plus assay is intended as an aid in the diagnosis of influenza from Nasopharyngeal swab specimens and should not be used as a sole basis for treatment. Nasal washings and aspirates are unacceptable for Xpert Xpress SARS-CoV-2/FLU/RSV testing.  Fact Sheet for Patients: BloggerCourse.com  Fact Sheet for Healthcare Providers: SeriousBroker.it  This test is not yet approved or cleared by the [place] FDA and has been authorized for detection and/or diagnosis of SARS-CoV-2 by FDA under an Emergency Use Authorization (EUA). This EUA will remain in effect (meaning this test can be used) for the duration of the COVID-19 declaration under Section 564(b)(1) of the Act, 21 U.S.C. section 360bbb-3(b)(1), unless the authorization is terminated or revoked.  Performed at [facility], [address], [place], [place] [address]   Culture, blood (Routine X 2) w Reflex to ID Panel     Status: None   Collection Time: 04/25/21  7:26 AM   Specimen: BLOOD RIGHT ARM  Result Value Ref Range Status   Specimen Description BLOOD RIGHT  ARM  Final   Special Requests   Final    BOTTLES DRAWN AEROBIC AND ANAEROBIC Blood Culture adequate volume   Culture   Final    NO GROWTH 5 DAYS Performed at [facility], [address], [place], [place] [address]    Report Status 04/30/2021 FINAL  Final  Culture, blood (Routine X 2) w Reflex to ID Panel     Status: None   Collection Time: 04/25/21  7:32 AM   Specimen: BLOOD RIGHT ARM  Result Value Ref Range Status   Specimen Description BLOOD RIGHT ARM  Final   Special Requests   Final    BOTTLES DRAWN AEROBIC AND ANAEROBIC Blood Culture adequate volume  Culture   Final    NO GROWTH 5 DAYS Performed at [facility], [address] [place], [place] [address]    Report Status 04/30/2021 FINAL  Final  MRSA Next Gen by PCR, Nasal     Status: None   Collection Time: 04/25/21  8:14 PM   Specimen: Nasal Mucosa; Nasal Swab  Result Value Ref Range Status   MRSA by PCR Next Gen NOT DETECTED NOT DETECTED Final    Comment: (NOTE) The GeneXpert MRSA Assay (FDA approved for NASAL specimens only), is one component of a comprehensive MRSA colonization surveillance program. It is not intended to diagnose MRSA infection nor to guide or monitor treatment for MRSA infections. Test performance is not FDA approved in patients less than 43 years old. Performed at [facility], [address] [place], [place] [address]     Procedures and diagnostic studies:  VAS [place] LOWER EXTREMITY SAPHENOUS VEIN MAPPING  Result Date: 05/02/2021 LOWER EXTREMITY VEIN MAPPING Patient Name:  SEVERO BEBER  Date of Exam:   05/02/2021 Medical Rec #: [id number]      Accession #:    [id number] Date of Birth: 30-Mar-1946     Patient Gender: M Patient Age:   75 years Exam Location:  [facility] Procedure:      VAS [place] LOWER EXTREMITY SAPHENOUS VEIN MAPPING Referring Phys: TIFFANY [place] --------------------------------------------------------------------------------  Indications:  Pre-op Risk Factors: PAD.  Comparison  Study: No prior study Performing Technologist: Sherren Kerns RVS  Examination Guidelines: A complete evaluation includes B-mode imaging, spectral Doppler, color Doppler, and power Doppler as needed of all accessible portions of each vessel. Bilateral testing is considered an integral part of a complete examination. Limited examinations for reoccurring indications may be performed as noted. +---------------+-----------+----------------------+---------------+-----------+   RT Diameter  RT Findings         GSV            LT Diameter  LT Findings      (cm)                                            (cm)                  +---------------+-----------+----------------------+---------------+-----------+      0.66                     Saphenofemoral         0.50                                                   Junction                                  +---------------+-----------+----------------------+---------------+-----------+      0.39       branching     Proximal thigh         0.47                  +---------------+-----------+----------------------+---------------+-----------+      0.32                       Mid thigh  0.48                  +---------------+-----------+----------------------+---------------+-----------+      0.24                      Distal thigh          0.46                  +---------------+-----------+----------------------+---------------+-----------+      0.22                          Knee              0.35                  +---------------+-----------+----------------------+---------------+-----------+      0.18                       Prox calf            0.40       branching  +---------------+-----------+----------------------+---------------+-----------+      0.16                        Mid calf            0.44                  +---------------+-----------+----------------------+---------------+-----------+      0.28        branching      Distal calf           0.37       branching  +---------------+-----------+----------------------+---------------+-----------+      0.21       branching         Ankle              0.35                  +---------------+-----------+----------------------+---------------+-----------+ Diagnosing physician: Waverly Ferrari MD Electronically signed by Waverly Ferrari MD on 05/02/2021 at 10:33:36 AM.    Final                LOS: 10 days   Addylin Manke  [facility]   Pager on [email or web address]. If 7PM-7AM, please contact night-coverage at [email or web address]     05/04/2021, 8:24 AM

## 2021-05-04 NOTE — Progress Notes (Signed)
[facility]  Subjective  -no complaints.   Objective (!) 141/78 77 98 F (36.7 C) (Oral) (!) 24 93%  Intake/Output Summary (Last 24 hours) at 05/04/2021 1224 Last data filed at 05/04/2021 1115 Gross per 24 hour  Intake 2418.78 ml  Output 1250 ml  Net 1168.78 ml    Left great toe with extensive wound and purulent drainage  Laboratory Lab Results: No results for input(s): WBC, HGB, HCT, PLT in the last 72 hours. BMET Recent Labs    05/04/21 0900  CREATININE 1.23    COAG Lab Results  Component Value Date   INR 1.4 (H) 04/24/2021   No results found for: PTT  Assessment/Planning:  75 year old male seen with critical limb ischemia with tissue loss of the left lower extremity last week.  He underwent angiogram last week.  I discussed plan for left common femoral endarterectomy with retrograde common iliac stenting and then a left fem distal bypass in the left leg.  In addition I have added on a left great toe amputation as I do not feel the toe is salvageable.  I discussed risk and benefits with the patient's.  He wishes to proceed.  This is ultimately for limb salvage.  [name] 05/04/2021 12:24 PM --

## 2021-05-04 NOTE — Anesthesia Procedure Notes (Signed)
Procedure Name: Intubation Date/Time: 05/04/2021 12:52 PM Performed by: Annamary Carolin, CRNA Pre-anesthesia Checklist: Patient identified, Emergency Drugs available, Suction available and Patient being monitored Patient Re-evaluated:Patient Re-evaluated prior to induction Oxygen Delivery Method: Circle System Utilized Preoxygenation: Pre-oxygenation with 100% oxygen Induction Type: IV induction Ventilation: Mask ventilation without difficulty Laryngoscope Size: Mac and 4 Grade View: Grade I Tube type: Oral Tube size: 8.0 mm Number of attempts: 1 Airway Equipment and Method: Stylet and Oral airway Placement Confirmation: ETT inserted through vocal cords under direct vision, positive ETCO2 and breath sounds checked- equal and bilateral Secured at: 23 cm Tube secured with: Tape Dental Injury: Teeth and Oropharynx as per pre-operative assessment

## 2021-05-05 ENCOUNTER — Encounter (HOSPITAL_COMMUNITY): Payer: Self-pay | Admitting: Vascular Surgery

## 2021-05-05 DIAGNOSIS — I4891 Unspecified atrial fibrillation: Secondary | ICD-10-CM | POA: Diagnosis not present

## 2021-05-05 DIAGNOSIS — L03119 Cellulitis of unspecified part of limb: Secondary | ICD-10-CM | POA: Diagnosis not present

## 2021-05-05 DIAGNOSIS — M86172 Other acute osteomyelitis, left ankle and foot: Secondary | ICD-10-CM | POA: Diagnosis not present

## 2021-05-05 DIAGNOSIS — E11628 Type 2 diabetes mellitus with other skin complications: Secondary | ICD-10-CM | POA: Diagnosis not present

## 2021-05-05 DIAGNOSIS — L03032 Cellulitis of left toe: Secondary | ICD-10-CM | POA: Diagnosis not present

## 2021-05-05 DIAGNOSIS — R7881 Bacteremia: Secondary | ICD-10-CM | POA: Diagnosis not present

## 2021-05-05 LAB — CBC WITH DIFFERENTIAL/PLATELET
Abs Immature Granulocytes: 0.1 10*3/uL — ABNORMAL HIGH (ref 0.00–0.07)
Basophils Absolute: 0.1 10*3/uL (ref 0.0–0.1)
Basophils Relative: 1 %
Eosinophils Absolute: 0.1 10*3/uL (ref 0.0–0.5)
Eosinophils Relative: 1 %
HCT: 27.9 % — ABNORMAL LOW (ref 39.0–52.0)
Hemoglobin: 9.5 g/dL — ABNORMAL LOW (ref 13.0–17.0)
Immature Granulocytes: 1 %
Lymphocytes Relative: 14 %
Lymphs Abs: 1.4 10*3/uL (ref 0.7–4.0)
MCH: 30.5 pg (ref 26.0–34.0)
MCHC: 34.1 g/dL (ref 30.0–36.0)
MCV: 89.7 fL (ref 80.0–100.0)
Monocytes Absolute: 0.5 10*3/uL (ref 0.1–1.0)
Monocytes Relative: 5 %
Neutro Abs: 8 10*3/uL — ABNORMAL HIGH (ref 1.7–7.7)
Neutrophils Relative %: 78 %
Platelets: 342 10*3/uL (ref 150–400)
RBC: 3.11 MIL/uL — ABNORMAL LOW (ref 4.22–5.81)
RDW: 13.2 % (ref 11.5–15.5)
WBC: 10 10*3/uL (ref 4.0–10.5)
nRBC: 0 % (ref 0.0–0.2)

## 2021-05-05 LAB — BASIC METABOLIC PANEL WITH GFR
Anion gap: 8 (ref 5–15)
BUN: 11 mg/dL (ref 8–23)
CO2: 25 mmol/L (ref 22–32)
Calcium: 8.5 mg/dL — ABNORMAL LOW (ref 8.9–10.3)
Chloride: 100 mmol/L (ref 98–111)
Creatinine, Ser: 1.2 mg/dL (ref 0.61–1.24)
GFR, Estimated: >60 mL/min (ref >60)
Glucose, Bld: 200 mg/dL — ABNORMAL HIGH (ref 70–99)
Potassium: 4.1 mmol/L (ref 3.5–5.1)
Sodium: 133 mmol/L — ABNORMAL LOW (ref 135–145)

## 2021-05-05 LAB — CBC
HCT: 28.9 % — ABNORMAL LOW (ref 39.0–52.0)
Hemoglobin: 9.7 g/dL — ABNORMAL LOW (ref 13.0–17.0)
MCH: 30.5 pg (ref 26.0–34.0)
MCHC: 33.6 g/dL (ref 30.0–36.0)
MCV: 90.9 fL (ref 80.0–100.0)
Platelets: 324 10*3/uL (ref 150–400)
RBC: 3.18 MIL/uL — ABNORMAL LOW (ref 4.22–5.81)
RDW: 13.2 % (ref 11.5–15.5)
WBC: 10.6 10*3/uL — ABNORMAL HIGH (ref 4.0–10.5)
nRBC: 0 % (ref 0.0–0.2)

## 2021-05-05 LAB — BASIC METABOLIC PANEL
Anion gap: 8 (ref 5–15)
BUN: 9 mg/dL (ref 8–23)
CO2: 27 mmol/L (ref 22–32)
Calcium: 8.4 mg/dL — ABNORMAL LOW (ref 8.9–10.3)
Chloride: 103 mmol/L (ref 98–111)
Creatinine, Ser: 1.13 mg/dL (ref 0.61–1.24)
GFR, Estimated: >60 mL/min (ref >60)
Glucose, Bld: 152 mg/dL — ABNORMAL HIGH (ref 70–99)
Potassium: 4.1 mmol/L (ref 3.5–5.1)
Sodium: 138 mmol/L (ref 135–145)

## 2021-05-05 LAB — LIPID PANEL
Cholesterol: 103 mg/dL (ref 0–200)
HDL: 19 mg/dL — ABNORMAL LOW (ref >40)
LDL Cholesterol: 50 mg/dL (ref 0–99)
Total CHOL/HDL Ratio: 5.4 RATIO
Triglycerides: 170 mg/dL — ABNORMAL HIGH (ref <150)
VLDL: 34 mg/dL (ref 0–40)

## 2021-05-05 LAB — GLUCOSE, CAPILLARY
Glucose-Capillary: 161 mg/dL — ABNORMAL HIGH (ref 70–99)
Glucose-Capillary: 239 mg/dL — ABNORMAL HIGH (ref 70–99)
Glucose-Capillary: 194 mg/dL — ABNORMAL HIGH (ref 70–99)
Glucose-Capillary: 120 mg/dL — ABNORMAL HIGH (ref 70–99)

## 2021-05-05 LAB — MAGNESIUM: Magnesium: 1.4 mg/dL — ABNORMAL LOW (ref 1.7–2.4)

## 2021-05-05 MED ORDER — ACETAMINOPHEN 325 MG PO TABS
325.0000 mg | ORAL_TABLET | Freq: Once | ORAL | Status: DC
  Filled 2021-05-05: qty 1

## 2021-05-05 MED ORDER — CLOPIDOGREL BISULFATE 75 MG PO TABS
300.0000 mg | ORAL_TABLET | Freq: Once | ORAL | Status: AC
  Administered 2021-05-05: 300 mg via ORAL
  Filled 2021-05-05: qty 4

## 2021-05-05 MED ORDER — CLOPIDOGREL BISULFATE 75 MG PO TABS
75.0000 mg | ORAL_TABLET | Freq: Every day | ORAL | Status: DC
  Administered 2021-05-06 – 2021-05-09 (×4): 75 mg via ORAL
  Filled 2021-05-05 (×4): qty 1

## 2021-05-05 MED ORDER — FENOFIBRATE 160 MG PO TABS
160.0000 mg | ORAL_TABLET | Freq: Every day | ORAL | Status: DC
  Administered 2021-05-05 – 2021-05-09 (×5): 160 mg via ORAL
  Filled 2021-05-05 (×5): qty 1

## 2021-05-05 MED ORDER — SODIUM CHLORIDE 0.9 % IV SOLN: INTRAVENOUS | Status: AC

## 2021-05-05 NOTE — Progress Notes (Addendum)
Progress Note    Todd Zhang  LNL:[id number] DOB: 11-03-1945  DOA: 04/24/2021 PCP: [name], FNP      Brief Narrative:    Medical records reviewed and are as summarized below:  Todd Zhang is a 75 y.o. male medical history of diabetes mellitus, hypertension, hyperlipidemia, COPD, anxiety, GERD, recent visit to [facility] IR on 04/21/2020 for confusion, who presented to the hospital with 1 day history of left flank pain and 2-week history of left foot pain.  He also complained of multiple falls secondary to unsteady gait.  He was admitted to the hospital for severe sepsis secondary to acute osteomyelitis of left great toe, left foot cellulitis with abscess. Blood culture showed group B strep.  He was treated with empiric IV antibiotics.  There was no evidence of cardiac thrombus or vegetation on 2D echo and TEE.  ID specialist was consulted to assist with management.  He was evaluated by vascular surgeon.  Aortogram showed complex peripheral vascular disease.  He underwent left lower extremity angioplasty and stenting and bypass graft.  He has been started on Plavix   Assessment/Plan:   Active Problems:   Diabetic foot infection (HCC)   Sepsis due to undetermined organism [facility])   Atrial fibrillation with RVR (HCC)   CKD (chronic kidney disease) stage 3, GFR 30-59 ml/min (HCC)   Cellulitis and abscess of foot   Acute osteomyelitis of toe of left foot (HCC)   Sepsis due to group B Streptococcus (HCC)   Cellulitis of great toe, left    Body mass index is 30.02 kg/m.  (Obesity)   Severe sepsis secondary to acute osteomyelitis left great toe, diabetic foot cellulitis with abscess, group B strep bacteremia:   S/p aortogram which showed complex PVD on 04/30/2020.    He had the following surgery on 05/04/2021:  S/p left common iliac artery angioplasty with stent placement, left common femoral endarterectomy with profundoplasty and bovine pericardial patch angioplasty,  left below-knee popliteal artery and TP trunk endarterectomy, left common femoral to TP trunk bypass with ipsilateral great saphenous vein, ray amputation of left great toe, debridement of left foot infection with dorsal foot abscess.  Continue IV Rocephin.  Follow-up with ID and vascular surgeon for further recommendations.  2D echo did not show any evidence of vegetation, EF was estimated at 60 to 65%.  No evidence of vegetation or cardiac thrombus on TEE that was done on 05/01/2021.  Atrial fibrillation with RVR: Heart rate is not well controlled probably due to recent surgery.  Continue metoprolol, Cardizem.  Hold Xarelto for now, because of recent extensive vascular surgery.  Discussed this with Dr. Chestine Spore, vascular surgeon.   Type 2 diabetes mellitus with hyperglycemia: Hemoglobin A1c 8.3.  Continue NovoLog as needed for hyperglycemia.  COPD, acute on ? chronic hypoxic respiratory failure: He was requiring up to 20 L/min oxygen via nasal cannula after surgery yesterday.  Oxygen has been weaned down to 2 L/min.  Suspect he will need home oxygen at discharge.  Hyponatremia: Improved   Other comorbidities include CKD stage IIIb, hypertension, hyperlipidemia  Benazepril and metformin have been held.  PT and OT recommend discharge to SNF.  Follow-up with social worker to assist with disposition.    Diet Order             Diet heart healthy/carb modified Room service appropriate? Yes; Fluid consistency: Thin  Diet effective now  Consultants: Infectious disease Vascular surgeon  Procedures: Aortogram on 04/30/2021    Medications:    atorvastatin  40 mg Oral Daily   [START ON 05/06/2021] clopidogrel  75 mg Oral Daily   diltiazem  240 mg Oral Daily   docusate sodium  100 mg Oral Daily   gabapentin  300 mg Oral TID   guaiFENesin  1,200 mg Oral BID   heparin  5,000 Units Subcutaneous Q8H   insulin aspart  0-5 Units Subcutaneous QHS   insulin aspart   0-9 Units Subcutaneous TID WC   loratadine  10 mg Oral Daily   metoprolol succinate  100 mg Oral Daily   multivitamin with minerals  1 tablet Oral Daily   pantoprazole  40 mg Oral Daily   sodium chloride flush  3 mL Intravenous Q12H   tamsulosin  0.4 mg Oral Daily   vitamin B-12  500 mcg Oral Daily   Continuous Infusions:  sodium chloride     sodium chloride 50 mL/hr at 05/05/21 1118   cefTRIAXone (ROCEPHIN)  IV Stopped (05/04/21 1042)   magnesium sulfate bolus IVPB       Anti-infectives (From admission, onward)    Start     Dose/Rate Route Frequency Ordered Stop   04/28/21 1200  cefTRIAXone (ROCEPHIN) 2 g in sodium chloride 0.9 % 100 mL IVPB       Note to Pharmacy: Patient tolerated cephalexin in the past   2 g 200 mL/hr over 30 Minutes Intravenous Every 24 hours 04/28/21 0958     04/27/21 1200  meropenem (MERREM) 1 g in sodium chloride 0.9 % 100 mL IVPB  Status:  Discontinued        1 g 200 mL/hr over 30 Minutes Intravenous Every 8 hours 04/27/21 0845 04/28/21 0957   04/25/21 1500  vancomycin (VANCOREADY) IVPB 1250 mg/250 mL  Status:  Discontinued        1,250 mg 166.7 mL/hr over 90 Minutes Intravenous Every 24 hours 04/24/21 1348 04/27/21 0845   04/24/21 2000  meropenem (MERREM) 500 mg in sodium chloride 0.9 % 100 mL IVPB  Status:  Discontinued        500 mg 200 mL/hr over 30 Minutes Intravenous Every 8 hours 04/24/21 1337 04/27/21 0845   04/24/21 1430  vancomycin (VANCOREADY) IVPB 2000 mg/400 mL        2,000 mg 200 mL/hr over 120 Minutes Intravenous  Once 04/24/21 1345 04/24/21 1819   04/24/21 1115  ciprofloxacin (CIPRO) IVPB 400 mg        400 mg 200 mL/hr over 60 Minutes Intravenous  Once 04/24/21 1100 04/24/21 1340   04/24/21 1115  clindamycin (CLEOCIN) IVPB 600 mg        600 mg 100 mL/hr over 30 Minutes Intravenous  Once 04/24/21 1100 04/24/21 1226              Family Communication/Anticipated D/C date and plan/Code Status   DVT prophylaxis: SCD's Start:  05/04/21 2251 heparin injection 5,000 Units Start: 04/30/21 2200 SCD's Start: 04/30/21 1433     Code Status: Full Code  Family Communication:  None Disposition Plan:    Status is: Inpatient  Remains inpatient appropriate because:IV treatments appropriate due to intensity of illness or inability to take PO and Inpatient level of care appropriate due to severity of illness  Dispo: The patient is from: Home              Anticipated d/c is to: Home  Patient currently is not medically stable to d/c.   Difficult to place patient No           Subjective:   Interval events noted.  He said she wants to go home.  No pain in the left lower extremity.  He was placed on oxygen yesterday evening because of hypoxia.  However, patient does not think he needs oxygen be fine without it.  His nurse was at the bedside.   Objective:    Vitals:   05/04/21 2300 05/05/21 0345 05/05/21 0751 05/05/21 0812  BP: (!) 149/67 112/64 (!) 151/80   Pulse: 96 94 (!) 104 (!) 106  Resp: 19 20 18    Temp: 97.8 F (36.6 C) 98 F (36.7 C) 97.7 F (36.5 C)   TempSrc: Oral Oral Oral   SpO2: 94% 91% 91% 91%  Weight:      Height:       No data found.   Intake/Output Summary (Last 24 hours) at 05/05/2021 1133 Last data filed at 05/05/2021 1118 Gross per 24 hour  Intake 2940 ml  Output 2125 ml  Net 815 ml   Filed Weights   04/25/21 2002 04/30/21 1432 05/01/21 0834  Weight: 102.6 kg 100.4 kg 100.4 kg    Exam:  GEN: NAD SKIN: Warm and dry EYES: No pallor or icterus ENT: MMM CV: RRR PULM: CTA B ABD: soft, obese, NT, +BS CNS: AAO x 3, non focal EXT: Surgical incision on the left lower extremity looks intact.  Dressing on left foot is clean, dry and intact.                   Data Reviewed:   I have personally reviewed following labs and imaging studies:  Labs: Labs show the following:   Basic Metabolic Panel: Recent Labs  Lab 04/29/21 0630 04/30/21 0250  05/01/21 0603 05/04/21 0900 05/04/21 1629 05/04/21 1930 05/04/21 2353  NA 132* 134* 136  --  140  --  138  K 3.6 3.5 3.7  --  4.2  --  4.1  CL 101 100 105  --   --   --  103  CO2 24 25 24   --   --   --  27  GLUCOSE 193* 138* 140*  --   --   --  152*  BUN 16 15 13   --   --   --  9  CREATININE 1.28* 1.30* 1.10 1.23  --  1.07 1.13  CALCIUM 8.3* 8.6* 8.1*  --   --   --  8.4*  MG 1.6*  --   --   --   --   --   --   PHOS 2.7  --   --   --   --   --   --    GFR Estimated Creatinine Clearance: 69.3 mL/min (by C-G formula based on SCr of 1.13 mg/dL). Liver Function Tests: Recent Labs  Lab 04/29/21 0630 04/30/21 0250 05/01/21 0603  AST 64* 89* 49*  ALT 51* 68* 56*  ALKPHOS 91 78 81  BILITOT 0.7 0.4 0.4  PROT 5.9* 5.7* 5.5*  ALBUMIN 2.1* 2.0* 1.9*   No results for input(s): LIPASE, AMYLASE in the last 168 hours. No results for input(s): AMMONIA in the last 168 hours. Coagulation profile No results for input(s): INR, PROTIME in the last 168 hours.   CBC: Recent Labs  Lab 04/28/21 1633 04/29/21 0630 04/30/21 0250 05/04/21 1629 05/04/21 1930 05/04/21 2353  WBC 9.2  11.0* 10.0  --  10.6* 10.6*  NEUTROABS 7.4 9.1* 7.4  --   --   --   HGB 10.5* 10.9* 10.8* 9.2* 9.3* 9.7*  HCT 30.0* 31.4* 31.5* 27.0* 28.1* 28.9*  MCV 89.0 88.2 89.0  --  91.5 90.9  PLT 280 287 308  --  332 324   Cardiac Enzymes: No results for input(s): CKTOTAL, CKMB, CKMBINDEX, TROPONINI in the last 168 hours. BNP (last 3 results) No results for input(s): PROBNP in the last 8760 hours. CBG: Recent Labs  Lab 05/04/21 0526 05/04/21 1104 05/04/21 1827 05/04/21 2039 05/05/21 0619  GLUCAP 143* 138* 155* 161* 161*   D-Dimer: No results for input(s): DDIMER in the last 72 hours. Hgb A1c: No results for input(s): HGBA1C in the last 72 hours. Lipid Profile: Recent Labs    05/04/21 2353  CHOL 103  HDL 19*  LDLCALC 50  TRIG 154*  CHOLHDL 5.4   Thyroid function studies: No results for input(s):  TSH, T4TOTAL, T3FREE, THYROIDAB in the last 72 hours.  Invalid input(s): FREET3 Anemia work up: No results for input(s): VITAMINB12, FOLATE, FERRITIN, TIBC, IRON, RETICCTPCT in the last 72 hours. Sepsis Labs: Recent Labs  Lab 04/29/21 0630 04/30/21 0250 05/04/21 1930 05/04/21 2353  WBC 11.0* 10.0 10.6* 10.6*    Microbiology Recent Results (from the past 240 hour(s))  MRSA Next Gen by PCR, Nasal     Status: None   Collection Time: 04/25/21  8:14 PM   Specimen: Nasal Mucosa; Nasal Swab  Result Value Ref Range Status   MRSA by PCR Next Gen NOT DETECTED NOT DETECTED Final    Comment: (NOTE) The GeneXpert MRSA Assay (FDA approved for NASAL specimens only), is one component of a comprehensive MRSA colonization surveillance program. It is not intended to diagnose MRSA infection nor to guide or monitor treatment for MRSA infections. Test performance is not FDA approved in patients less than 74 years old. Performed at [facility], [address] [place], [place] [address]   Aerobic/Anaerobic Culture w Gram Stain (surgical/deep wound)     Status: None (Preliminary result)   Collection Time: 05/04/21  5:57 PM   Specimen: Foot, Left; Abscess  Result Value Ref Range Status   Specimen Description ABSCESS LEFT FOOT  Final   Special Requests NONE  Final   Gram Stain   Final    RARE WBC PRESENT, PREDOMINANTLY MONONUCLEAR NO ORGANISMS SEEN Performed at [facility], [address], [place], [place] [address]    Culture PENDING  Incomplete   Report Status PENDING  Incomplete  Aerobic/Anaerobic Culture w Gram Stain (surgical/deep wound)     Status: None (Preliminary result)   Collection Time: 05/04/21  6:03 PM   Specimen: Bone; Tissue  Result Value Ref Range Status   Specimen Description BONE  Final   Special Requests LEFT RAY TOE  Final   Gram Stain   Final    RARE WBC PRESENT,BOTH PMN AND MONONUCLEAR NO ORGANISMS SEEN Performed at [facility], [address]  [place], [place] [address]    Culture PENDING  Incomplete   Report Status PENDING  Incomplete    Procedures and diagnostic studies:  HYBRID OR IMAGING ([facility] ONLY)  Result Date: 05/04/2021 There is no interpretation for this exam.  This order is for images obtained during a surgical procedure.  Please See "Surgeries" Tab for more information regarding the procedure.               LOS:  11 days   Todd Zhang  [employer] Chartered loss adjuster on [email or web address]. If 7PM-7AM, please contact night-coverage at [email or web address]     05/05/2021, 11:33 AM

## 2021-05-05 NOTE — Anesthesia Postprocedure Evaluation (Signed)
Anesthesia Post Note  Patient: Todd Zhang  Procedure(s) Performed: LEFT COMMON FEMORAL ENDARTERECTOMY WITH BOVINE PATCH (Left: Leg Upper) LEFT COMMON FEMORAL-TIBIOPERONEAL TRUNK BYPASS (Left: Leg Upper) LEFT COMMON ILLIAC ATERY ANGIOPLASTY WITH STENT (Left: Leg Upper) LEFT GREAT TOE AMPUTATION (Left: Toe) PROFUNDOPLASTY (Left: Leg Upper) HARVEST OF LEFT GREATER SAPHENOUS VEIN (Leg Upper) LEFT ARTERY ARTERIOGRAM (Left: Leg Upper) LEFT BELOW KNEE POPITEAL WITH  TIBIOPERONEAL ENDARTERECTOMY (Left: Leg Upper) IRRIGATION AND DEBRIDEMENT FOOT (Left: Toe)     Patient location during evaluation: PACU Anesthesia Type: General Level of consciousness: awake and alert Pain management: pain level controlled Vital Signs Assessment: post-procedure vital signs reviewed and stable Respiratory status: spontaneous breathing, nonlabored ventilation, respiratory function stable and patient connected to nasal cannula oxygen Cardiovascular status: blood pressure returned to baseline and stable Postop Assessment: no apparent nausea or vomiting Anesthetic complications: no   No notable events documented.  Last Vitals:  Vitals:   05/05/21 0751 05/05/21 0812  BP: (!) 151/80   Pulse: (!) 104 (!) 106  Resp: 18   Temp: 36.5 C   SpO2: 91% 91%    Last Pain:  Vitals:   05/05/21 0757  TempSrc:   PainSc: 8                  Beryle Lathe

## 2021-05-05 NOTE — Evaluation (Signed)
Occupational Therapy Evaluation Patient Details Name: Todd Zhang MRN: [id number] DOB: Mar 17, 1946 Today's Date: 05/05/2021    History of Present Illness 75 y.o male admitted 8/12 with diabetic foot infection on L and flank pain. 8/13 uncontrolled a fib with RVR and soft BP. 8/14 transferred to ICU. 8/18 abdominal aortogram on LE. 8/19 TEE. 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 PMH: DM, HTN,HLD, COPD, anxiety, GERD, recent visit to [facility] IR on 04/21/2020 for confusion.   Clinical Impression   PTA, pt lives alone and reports Independence with ADLs, IADLs and mobility though endorses hx of falls. Unsure of PLOF/home setup accuracy due to pt poor historian. Pt presents now with deficits in cognition, strength, standing balance, and endurance. Pt received in bathroom with nursing student on RA. Darco shoe not yet delivered and pt with difficulty maintaining WB precautions and balance during mobility at min guard. Pt overall Setup for UB ADLs and Min A for LB ADLs. Due to pt at high risk for falls and decreased awareness of safety/medical complexities, recommend SNF rehab at DC.  Received on RA, SpO2 93% after returning to chair. Noted to desat to 87% at rest in chair - placed 2 L O2 with pt sustaining low 90s. HR up to 150bpm with activity.    Follow Up Recommendations  SNF    Equipment Recommendations  3 in 1 bedside commode    Recommendations for Other Services       Precautions / Restrictions Precautions Precautions: Fall Required Braces or Orthoses: Other Brace Other Brace: darco shoe (ordered 8/23 but not yet delivered to room) Restrictions Weight Bearing Restrictions: Yes LLE Weight Bearing: Partial weight bearing Other Position/Activity Restrictions: heel weight bearing      Mobility Bed Mobility Overal bed mobility: Needs Assistance Bed Mobility: Supine to Sit     Supine to  sit: Supervision     General bed mobility comments: received in bathroom    Transfers Overall transfer level: Needs assistance Equipment used: Rolling walker (2 wheeled) Transfers: Sit to/from Stand Sit to Stand: Min guard         General transfer comment: min guard for safety in standing from toilet with RW    Balance Overall balance assessment: Needs assistance Sitting-balance support: No upper extremity supported Sitting balance-Leahy Scale: Good     Standing balance support: Bilateral upper extremity supported Standing balance-Leahy Scale: Poor Standing balance comment: reliant on at least one UE support                           ADL either performed or assessed with clinical judgement   ADL Overall ADL's : Needs assistance/impaired Eating/Feeding: Independent   Grooming: Min guard;Standing   Upper Body Bathing: Sitting;Set up   Lower Body Bathing: Minimal assistance;Sit to/from stand   Upper Body Dressing : Set up;Sitting   Lower Body Dressing: Minimal assistance;Sit to/from stand   Toilet Transfer: Min guard;Ambulation;RW;Regular Teacher, adult education Details (indicate cue type and reason): received in bathroom with nursing student. Pt with reliance on grab bar or RW for hygiene in standing, frequent cues for WB. Pt able to ambulate back to recliner (no darco shoe delivered in room yet) Toileting- Architect and Hygiene: Min guard;Sit to/from stand;Sitting/lateral lean Toileting - Clothing Manipulation Details (indicate cue type and reason): for clothing mgmt, difficulty maintaining balance, WB precautions and safety     Functional  mobility during ADLs: Min guard;Rolling walker;Cueing for sequencing;Cueing for safety General ADL Comments: Pt with significant deficits in safety awareness, need for WB precautions, medical complexities, etc.     Vision Baseline Vision/History: 1 Wears glasses Ability to See in Adequate Light: 0  Adequate Vision Assessment?: No apparent visual deficits     Perception     Praxis      Pertinent Vitals/Pain Pain Assessment: Faces Faces Pain Scale: Hurts little more Pain Location: L foot Pain Descriptors / Indicators: Sore;Grimacing;Guarding Pain Intervention(s): Monitored during session;Repositioned     Hand Dominance Right   Extremity/Trunk Assessment Upper Extremity Assessment Upper Extremity Assessment: Generalized weakness   Lower Extremity Assessment Lower Extremity Assessment: Defer to PT evaluation   Cervical / Trunk Assessment Cervical / Trunk Assessment: Other exceptions Cervical / Trunk Exceptions: rounded shoulders   Communication Communication Communication: No difficulties   Cognition Arousal/Alertness: Awake/alert Behavior During Therapy: Flat affect Overall Cognitive Status: Impaired/Different from baseline Area of Impairment: Following commands;Safety/judgement;Problem solving;Orientation;Memory;Awareness                 Orientation Level: Disoriented to;Time;Situation   Memory: Decreased recall of precautions Following Commands: Follows one step commands with increased time Safety/Judgement: Decreased awareness of safety;Decreased awareness of deficits Awareness: Emergent Problem Solving: Slow processing;Requires verbal cues General Comments: pt with decreased awareness of deficits and safety with difficulty responding to PLOF. Pt focused on going home this evening. Reports does not need O2 though sats noted to drop without it, not following WB precautions   General Comments  Pt on RA in bathroom with nursing student - SpO2 93% after mobility on RA back to recliner. While sitting in chair, O2 desats to 87% on RA - placed on 2 L O2 with sustained low 90s.No darco shoe present in room yet (noted ortho tech note in chart) - collab with nursing need for darco shoe to maintain safety. blood noted on L foot bandage (RN aware and coming to in to  redress wound, reports it was present earlier this AM).    Exercises     Shoulder Instructions      Home Living Family/patient expects to be discharged to:: Private residence Living Arrangements: Alone Available Help at Discharge: Family;Available PRN/intermittently Type of Home: Apartment Home Access: Stairs to enter Entrance Stairs-Number of Steps: 14 Entrance Stairs-Rails: Right Home Layout: One level     Bathroom Shower/Tub: Tub/shower unit;Walk-in shower   Bathroom Toilet: Standard     Home Equipment: Environmental consultant - 2 wheels;Cane - single point;Shower seat   Additional Comments: inconsistent and contradictory home setup info given. reports living on 2nd floor but has elevator but uses stairs to access apartment? Reports having both tub and walk in shower      Prior Functioning/Environment Level of Independence: Independent        Comments: inconsistent responses. reports no use of AD for mobility, endorses falls. Reports independence with ADLs, IADLs, reports driving and grocery shopping daily?        OT Problem List: Decreased strength;Decreased activity tolerance;Impaired balance (sitting and/or standing);Decreased cognition;Decreased knowledge of use of DME or AE;Decreased safety awareness;Decreased knowledge of precautions;Pain      OT Treatment/Interventions: Self-care/ADL training;Therapeutic exercise;DME and/or AE instruction;Therapeutic activities;Patient/family education;Balance training    OT Goals(Current goals can be found in the care plan section) Acute Rehab OT Goals Patient Stated Goal: go home to take care of things OT Goal Formulation: With patient Time For Goal Achievement: 05/19/21 Potential to Achieve Goals: Good  OT Frequency: Min  2X/week   Barriers to D/C:            Co-evaluation              AM-PAC OT "6 Clicks" Daily Activity     Outcome Measure Help from another person eating meals?: None Help from another person taking care of  personal grooming?: A Little Help from another person toileting, which includes using toliet, bedpan, or urinal?: A Little Help from another person bathing (including washing, rinsing, drying)?: A Little Help from another person to put on and taking off regular upper body clothing?: A Little Help from another person to put on and taking off regular lower body clothing?: A Little 6 Click Score: 19   End of Session Equipment Utilized During Treatment: Rolling walker;Oxygen Nurse Communication: Mobility status;Other (comment) (O2, bleeding, darco shoe)  Activity Tolerance: Patient tolerated treatment well Patient left: in chair;with call bell/phone within reach;with chair alarm set;with nursing/sitter in room  OT Visit Diagnosis: Unsteadiness on feet (R26.81);Other abnormalities of gait and mobility (R26.89);Pain;Muscle weakness (generalized) (M62.81);Other symptoms and signs involving cognitive function Pain - Right/Left: Left Pain - part of body: Ankle and joints of foot                Time: 0922-0937 OT Time Calculation (min): 15 min Charges:  OT General Charges $OT Visit: 1 Visit OT Evaluation $OT Eval Moderate Complexity: 1 Mod  Bradd Canary, OTR/L Acute Rehab Services Office: [phone]   Lorre Munroe 05/05/2021, 10:45 AM

## 2021-05-05 NOTE — Progress Notes (Signed)
[facility]  Subjective  -no complaints.  Appears to be in A. fib with RVR this morning.   Objective (!) 151/80 (!) 104 97.7 F (36.5 C) (Oral) 18 91%  Intake/Output Summary (Last 24 hours) at 05/05/2021 0756 Last data filed at 05/05/2021 0351 Gross per 24 hour  Intake 3240 ml  Output 2925 ml  Net 315 ml    Left groin and leg incisions clean dry and intact Left PT and peroneal brisk by Doppler at the ankle Ray amputation of left great toe wet-to-dry dressing change.  Good bleeding in the wound base.  There is some dusky tissue on the plantar surface  Laboratory Lab Results: Recent Labs    05/04/21 1930 05/04/21 2353  WBC 10.6* 10.6*  HGB 9.3* 9.7*  HCT 28.1* 28.9*  PLT 332 324   BMET Recent Labs    05/04/21 1629 05/04/21 1930 05/04/21 2353  NA 140  --  138  K 4.2  --  4.1  CL  --   --  103  CO2  --   --  27  GLUCOSE  --   --  152*  BUN  --   --  9  CREATININE  --  1.07 1.13  CALCIUM  --   --  8.4*    COAG Lab Results  Component Value Date   INR 1.4 (H) 04/24/2021   No results found for: PTT  Assessment/Planning:  Postop day 1 status post extensive revascularization of left lower extremity with ray amputation of the left great toe and I&D of a plantar abscess.  There are cultures from the OR including both bone cultures and abscess cultures that we will follow.  Appears to have excellent perfusion of the left lower extremity after left common iliac stent, femoral endart, and left femoral to PT bypass.  We will need to watch duskiness on the plantar surface of his foot.  Can get out of bed and do weightbearing on the heel.  Darco shoe ordered.  Appears to have aspirin allergy.  Will load on Plavix today and do 75 mg Plavix daily for iliac stent.  Todd Zhang 05/05/2021 7:56 AM --

## 2021-05-05 NOTE — NC FL2 (Signed)
[place] MEDICAID FL2 LEVEL OF CARE SCREENING TOOL     IDENTIFICATION  Patient Name: Todd Zhang Birthdate: May 11, 1946 Sex: male Admission Date (Current Location): 04/24/2021  [facility] and [place] Number:  Producer, television/film/video and Address:  [facility], [address] [place], [place] [address]      Provider Number: [id number]  Attending Physician Name and Address:  Lurene Shadow, MD  Relative Name and Phone Number:  JEMARI, HALLUM (Brother)   [phone] [facility])    Current Level of Care: Hospital Recommended Level of Care: Skilled Nursing Facility Prior Approval Number: [id number]  Date Approved/Denied:   PASRR Number: [id number]  Discharge Plan: SNF    Current Diagnoses: Patient Active Problem List   Diagnosis Date Noted   Sepsis due to group B Streptococcus (HCC) 04/26/2021   Cellulitis of great toe, left    Acute osteomyelitis of toe of left foot (HCC) 04/25/2021   Cellulitis and abscess of foot    Diabetic foot infection (HCC) 04/24/2021   Sepsis due to undetermined organism (HCC) 04/24/2021   Atrial fibrillation with RVR (HCC) 04/24/2021   CKD (chronic kidney disease) stage 3, GFR 30-59 ml/min (HCC) 04/24/2021   Pure hypercholesterolemia 07/16/2020   Peripheral artery disease (HCC)    COPD exacerbation (HCC) 05/14/2019   DDD (degenerative disc disease), cervical 11/06/2018   Cervical spinal stenosis 11/06/2018   Generalized anxiety disorder 11/29/2017   Benign prostatic hyperplasia with incomplete bladder emptying 11/28/2017   Gastroesophageal reflux disease without esophagitis 09/07/2017   Essential hypertension 09/01/2017   Onychomycosis 09/01/2017   Type 2 diabetes mellitus with diabetic neuropathy, with long-term current use of insulin (HCC) 06/19/2014   Back pain 06/19/2014   Neck pain 06/19/2014    Orientation RESPIRATION BLADDER Height & Weight     Self, Time, Situation, Place  O2 (2L [place]) Continent Weight: 221 lb  5.5 oz (100.4 kg) Height:  6' (182.9 cm)  BEHAVIORAL SYMPTOMS/MOOD NEUROLOGICAL BOWEL NUTRITION STATUS      Continent Diet (see d/c summary)  AMBULATORY STATUS COMMUNICATION OF NEEDS Skin   Extensive Assist Verbally Surgical wounds (incision left leg, incision left toe)                       Personal Care Assistance Level of Assistance  Bathing, Dressing, Feeding Bathing Assistance: Maximum assistance Feeding assistance: Independent Dressing Assistance: Maximum assistance     Functional Limitations Info  Sight, Hearing, Speech Sight Info: Impaired Hearing Info: Adequate Speech Info: Adequate    SPECIAL CARE FACTORS FREQUENCY  PT (By licensed PT), OT (By licensed OT)     PT Frequency: 5x/week OT Frequency: 5x/week            Contractures Contractures Info: Not present    Additional Factors Info  Code Status, Allergies Code Status Info: Full code Allergies Info: Asa (aspirin), penecillins           Current Medications (05/05/2021):  This is the current hospital active medication list Current Facility-Administered Medications  Medication Dose Route Frequency Provider Last Rate Last Admin   0.9 %  sodium chloride infusion  500 mL Intravenous Once PRN Clinton Gallant M, PA-C       0.9 %  sodium chloride infusion   Intravenous Continuous Emilie Rutter, PA-C   Stopped at 05/05/21 1200   acetaminophen (TYLENOL) tablet 650 mg  650 mg Oral Q6H PRN Lars Mage, PA-C   650 mg at 04/25/21 1610   Or  acetaminophen (TYLENOL) suppository 650 mg  650 mg Rectal Q6H PRN Lars Mage, PA-C   650 mg at 04/26/21 1820   alum & mag hydroxide-simeth (MAALOX/MYLANTA) 200-200-20 MG/5ML suspension 15-30 mL  15-30 mL Oral Q2H PRN Clinton Gallant M, PA-C       atorvastatin (LIPITOR) tablet 40 mg  40 mg Oral Daily Clinton Gallant M, PA-C   40 mg at 05/05/21 3662   cefTRIAXone (ROCEPHIN) 2 g in sodium chloride 0.9 % 100 mL IVPB  2 g Intravenous Q24H Clinton Gallant M, PA-C 200 mL/hr at  05/05/21 1201 Infusion Verify at 05/05/21 1201   [START ON 05/06/2021] clopidogrel (PLAVIX) tablet 75 mg  75 mg Oral Daily Emilie Rutter, PA-C       diltiazem (CARDIZEM CD) 24 hr capsule 240 mg  240 mg Oral Daily Clinton Gallant M, PA-C   240 mg at 05/05/21 9476   docusate sodium (COLACE) capsule 100 mg  100 mg Oral Daily Clinton Gallant M, PA-C   100 mg at 05/05/21 5465   fenofibrate tablet 160 mg  160 mg Oral Daily Scarlett Presto, RPH       gabapentin (NEURONTIN) capsule 300 mg  300 mg Oral TID Lars Mage, PA-C   300 mg at 05/05/21 0354   guaiFENesin (MUCINEX) 12 hr tablet 1,200 mg  1,200 mg Oral BID Clinton Gallant M, PA-C   1,200 mg at 05/04/21 2258   guaiFENesin-dextromethorphan (ROBITUSSIN DM) 100-10 MG/5ML syrup 15 mL  15 mL Oral Q4H PRN Clinton Gallant M, PA-C       heparin injection 5,000 Units  5,000 Units Subcutaneous Q8H Clinton Gallant M, [place]   5,000 Units at 05/05/21 1409   hydrALAZINE (APRESOLINE) injection 5 mg  5 mg Intravenous Q20 Min PRN Clinton Gallant M, PA-C       HYDROcodone-acetaminophen (NORCO/VICODIN) 5-325 MG per tablet 1-2 tablet  1-2 tablet Oral Q4H PRN Lars Mage, PA-C   1 tablet at 05/05/21 6568   HYDROmorphone (DILAUDID) injection 0.5-1 mg  0.5-1 mg Intravenous Q2H PRN Clinton Gallant M, PA-C       insulin aspart (novoLOG) injection 0-5 Units  0-5 Units Subcutaneous QHS Lars Mage, [place]   3 Units at 05/03/21 2230   insulin aspart (novoLOG) injection 0-9 Units  0-9 Units Subcutaneous TID WC Lars Mage, PA-C   3 Units at 05/05/21 1156   labetalol (NORMODYNE) injection 10 mg  10 mg Intravenous Q10 min PRN Lars Mage, PA-C   10 mg at 04/30/21 1275   levalbuterol (XOPENEX) nebulizer solution 0.63 mg  0.63 mg Nebulization Q6H PRN Clinton Gallant M, PA-C       loratadine (CLARITIN) tablet 10 mg  10 mg Oral Daily Clinton Gallant M, PA-C   10 mg at 05/05/21 1700   magnesium sulfate IVPB 2 g 50 mL  2 g Intravenous Daily PRN Clinton Gallant M, PA-C       metoprolol  succinate (TOPROL-XL) 24 hr tablet 100 mg  100 mg Oral Daily Clinton Gallant M, PA-C   100 mg at 05/05/21 1749   multivitamin with minerals tablet 1 tablet  1 tablet Oral Daily Lars Mage, PA-C   1 tablet at 05/05/21 0955   ondansetron (ZOFRAN) tablet 4 mg  4 mg Oral Q6H PRN Lars Mage, PA-C       Or   ondansetron [facility]) injection 4 mg  4 mg Intravenous Q6H PRN Lars Mage, PA-C  pantoprazole (PROTONIX) EC tablet 40 mg  40 mg Oral Daily Clinton Gallant M, PA-C   40 mg at 05/05/21 0955   phenol (CHLORASEPTIC) mouth spray 1 spray  1 spray Mouth/Throat PRN Clinton Gallant M, PA-C       potassium chloride SA (KLOR-CON) CR tablet 20-40 mEq  20-40 mEq Oral Daily PRN Clinton Gallant M, PA-C       sodium chloride (OCEAN) 0.65 % nasal spray 1 spray  1 spray Each Nare PRN Lars Mage, PA-C   1 spray at 04/26/21 1626   sodium chloride flush (NS) 0.9 % injection 3 mL  3 mL Intravenous Q12H Clinton Gallant M, PA-C   3 mL at 05/04/21 2258   sodium chloride flush (NS) 0.9 % injection 3 mL  3 mL Intravenous PRN Clinton Gallant M, PA-C       tamsulosin [facility]) capsule 0.4 mg  0.4 mg Oral Daily Clinton Gallant M, PA-C   0.4 mg at 05/05/21 2683   vitamin B-12 (CYANOCOBALAMIN) tablet 500 mcg  500 mcg Oral Daily Clinton Gallant M, PA-C   500 mcg at 05/05/21 4196     Discharge Medications: Please see discharge summary for a list of discharge medications.  Relevant Imaging Results:  Relevant Lab Results:   Additional Information SSN [id number] Pt is covid vaccinated with 1 booster  Felix Meras Aris Lot, LCSW

## 2021-05-05 NOTE — Progress Notes (Addendum)
PHARMACIST LIPID MONITORING   Todd Zhang is a 75 y.o. male admitted on 04/24/2021 with left flank pain and left foot pain. S/p vascular/arterial procedures of several sites on 8/22, as well as L great toe amputation and I&D of L plantar abscess.  Pharmacy has been consulted to optimize lipid-lowering therapy with the indication of secondary prevention for clinical ASCVD.  Recent Labs:  Lipid Panel (last 6 months):   Lab Results  Component Value Date   CHOL 103 05/04/2021   TRIG 170 (H) 05/04/2021   HDL 19 (L) 05/04/2021   CHOLHDL 5.4 05/04/2021   VLDL 34 05/04/2021   LDLCALC 50 05/04/2021    Hepatic function panel (last 6 months):   Lab Results  Component Value Date   AST 49 (H) 05/01/2021   ALT 56 (H) 05/01/2021   ALKPHOS 81 05/01/2021   BILITOT 0.4 05/01/2021    SCr (since admission):   Serum creatinine: 1.2 mg/dL 31/51/76 1607 Estimated creatinine clearance: 65.2 mL/min  Current therapy and lipid therapy tolerance Current lipid-lowering therapy: Atrovastatin 40 mg daily  - also on Fenofibrate 145 mg daily PTA but not currently Previous lipid-lowering therapies (if applicable): Pravastatin 40 mg daily > changed to Atorvastatin 01/28/21 Documented or reported allergies or intolerances to lipid-lowering therapies (if applicable): none  Assessment:    Per office visit 01/28/21, Pravastatin 40 mg daily was changed to Atrovastatin 40 mg daily. Continued on Fenofibrate 145 mg daily.   01/28/21: LDL 128, triglycerides 245.  05/05/21: LDL  50,  triglycerides 170, improved over ~3 months  Plan:    1.Statin intensity (high intensity recommended for all patients regardless of the LDL:  Continue Atorvastatin 40 mg daily.  2. Resume Fenofibrate 145 mg daily > MC substitution 160 mg daily.  3..Follow-up labs after discharge:  No changes in lipid therapy, repeat a lipid panel in one year.      Dennie Fetters, RPh 05/05/2021, 2:56 PM

## 2021-05-05 NOTE — Evaluation (Signed)
Physical Therapy Evaluation Patient Details Name: Todd Zhang MRN: [id number] DOB: 1946-04-16 Today's Date: 05/05/2021   History of Present Illness  75 y.o male admitted 8/12 with diabetic foot infection on L and flank pain. 8/13 uncontrolled a fib with RVR and soft BP. 8/14 transferred to ICU. 8/18 abdominal aortogram on LE. 8/19 TEE. 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 PMH: DM, HTN,HLD, COPD, anxiety, GERD, recent visit to [facility] IR on 04/21/2020 for confusion.   Clinical Impression  Pt limited by pain and fatigue during session with spO2 in low 90s on 4L. Pt ambulated in room with RW, showing difficulty with maintaining static standing balance. Pt's deficits include decreased mobility, balance, awareness of precautions and safety, activity tolerance, and function. Pt would benefit from continued PT to improve listed deficits. Recommend SNF upon d/c, inaccessible home environment and uncertain whether pt has 24hr support.   Vitals - HR 110-140, spO2 91% on 4L    Follow Up Recommendations SNF;Supervision for mobility/OOB    Equipment Recommendations  Rolling walker with 5" wheels;3in1 (PT)    Recommendations for Other Services       Precautions / Restrictions Precautions Precautions: Fall Restrictions Weight Bearing Restrictions: Yes LLE Weight Bearing: Partial weight bearing Other Position/Activity Restrictions: heel weight bearing      Mobility  Bed Mobility Overal bed mobility: Needs Assistance Bed Mobility: Supine to Sit     Supine to sit: Supervision     General bed mobility comments: HOB 20 degrees with rail and increased time and effort    Transfers Overall transfer level: Needs assistance   Transfers: Sit to/from Stand Sit to Stand: Min assist         General transfer comment: posterior lean with rise with assist to maintain balance, decreased descent control  with pt plopping into chair despite cues  Ambulation/Gait Ambulation/Gait assistance: Min assist Gait Distance (Feet): 20 Feet Assistive device: Rolling walker (2 wheeled) Gait Pattern/deviations: Step-to pattern;Decreased stride length   Gait velocity interpretation: <1.8 ft/sec, indicate of risk for recurrent falls General Gait Details: cues for heel weight bearing with pt attempting to keep toes up but clearly struggling with maintaining full heel weight bearing. Limited by fatigue and lack of precaution following.  Stairs            Wheelchair Mobility    Modified Rankin (Stroke Patients Only)       Balance Overall balance assessment: Needs assistance Sitting-balance support: No upper extremity supported Sitting balance-Leahy Scale: Fair     Standing balance support: Bilateral upper extremity supported Standing balance-Leahy Scale: Poor Standing balance comment: reliant on bil UE support for standing                             Pertinent Vitals/Pain      Home Living Family/patient expects to be discharged to:: Private residence Living Arrangements: Alone   Type of Home: Apartment Home Access: Stairs to enter Entrance Stairs-Rails: Right Entrance Stairs-Number of Steps: 14 Home Layout: One level Home Equipment: Walker - 2 wheels;Cane - single point Additional Comments: pt with inconsistent report of home environment and difficulty answering function    Prior Function Level of Independence: Independent               Hand Dominance        Extremity/Trunk Assessment   Upper Extremity Assessment Upper Extremity  Assessment: Overall WFL for tasks assessed    Lower Extremity Assessment Lower Extremity Assessment: Generalized weakness    Cervical / Trunk Assessment Cervical / Trunk Assessment: Other exceptions Cervical / Trunk Exceptions: rounded shoulders  Communication   Communication: No difficulties  Cognition  Arousal/Alertness: Awake/alert Behavior During Therapy: Flat affect Overall Cognitive Status: Impaired/Different from baseline Area of Impairment: Following commands;Safety/judgement;Problem solving                         Safety/Judgement: Decreased awareness of safety;Decreased awareness of deficits   Problem Solving: Slow processing;Requires verbal cues General Comments: pt with decreased awareness of deficits and safety with difficulty responding to PLOF. Pt focused on going home to "take care of things"      General Comments      Exercises     Assessment/Plan    PT Assessment Patient needs continued PT services  PT Problem List Decreased strength;Decreased mobility;Decreased safety awareness;Decreased coordination;Decreased activity tolerance;Decreased cognition;Decreased balance;Decreased knowledge of use of DME;Pain;Decreased skin integrity       PT Treatment Interventions DME instruction;Gait training;Stair training;Functional mobility training;Therapeutic activities;Patient/family education;Cognitive remediation;Balance training;Therapeutic exercise    PT Goals (Current goals can be found in the Care Plan section)  Acute Rehab PT Goals Patient Stated Goal: go home to take care of things PT Goal Formulation: With patient Time For Goal Achievement: 05/19/21 Potential to Achieve Goals: Fair    Frequency Min 3X/week (until [facility] confirmed)   Barriers to discharge Decreased caregiver support;Inaccessible home environment      Co-evaluation               AM-PAC PT "6 Clicks" Mobility  Outcome Measure Help needed turning from your back to your side while in a flat bed without using bedrails?: A Little Help needed moving from lying on your back to sitting on the side of a flat bed without using bedrails?: A Little Help needed moving to and from a bed to a chair (including a wheelchair)?: A Little Help needed standing up from a chair using your arms (e.g.,  wheelchair or bedside chair)?: A Little Help needed to walk in hospital room?: A Little Help needed climbing 3-5 steps with a railing? : A Lot 6 Click Score: 17    End of Session Equipment Utilized During Treatment: Gait belt;Oxygen Activity Tolerance: Patient limited by pain;Patient limited by fatigue Patient left: in chair;with call bell/phone within reach;with chair alarm set Nurse Communication: Mobility status PT Visit Diagnosis: Other abnormalities of gait and mobility (R26.89);Difficulty in walking, not elsewhere classified (R26.2);Muscle weakness (generalized) (M62.81);History of falling (Z91.81);Unsteadiness on feet (R26.81)    Time: 0347-4259 PT Time Calculation (min) (ACUTE ONLY): 24 min   Charges:   PT Evaluation $PT Eval Moderate Complexity: 1 Mod PT Treatments $Therapeutic Activity: 8-22 mins        Velda Shell, SPT Acute Rehab: [phone]     Vance Gather 05/05/2021, 9:14 AM

## 2021-05-05 NOTE — Progress Notes (Signed)
[facility]    Date of Admission:  04/24/2021   Total days of antibiotics 12           ID: Todd Zhang is a 75 y.o. 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, left foot abscess debridement, and LCIA  angioplasty , LCF endarterectomy with patch angiopalsty, LCF to TP trunk bypass with ispilateral non-reversed great saphenous vein on 8/22 Active Problems:   Diabetic foot infection (HCC)   Sepsis due to undetermined organism (HCC)   Atrial fibrillation with RVR (HCC)   CKD (chronic kidney disease) stage 3, GFR 30-59 ml/min (HCC)   Cellulitis and abscess of foot   Acute osteomyelitis of toe of left foot (HCC)   Sepsis due to group B Streptococcus (HCC)   Cellulitis of great toe, left    Subjective: Afebrile, but having some left discomfort from surgery. Continues to tolerate abtx  Medications:   atorvastatin  40 mg Oral Daily   [START ON 05/06/2021] clopidogrel  75 mg Oral Daily   diltiazem  240 mg Oral Daily   docusate sodium  100 mg Oral Daily   fenofibrate  160 mg Oral Daily   gabapentin  300 mg Oral TID   guaiFENesin  1,200 mg Oral BID   heparin  5,000 Units Subcutaneous Q8H   insulin aspart  0-5 Units Subcutaneous QHS   insulin aspart  0-9 Units Subcutaneous TID WC   loratadine  10 mg Oral Daily   metoprolol succinate  100 mg Oral Daily   multivitamin with minerals  1 tablet Oral Daily   pantoprazole  40 mg Oral Daily   sodium chloride flush  3 mL Intravenous Q12H   tamsulosin  0.4 mg Oral Daily   vitamin B-12  500 mcg Oral Daily    Objective: Vital signs in last 24 hours: Temp:  [97 F (36.1 C)-98 F (36.7 C)] 97.9 F (36.6 C) (08/23 1531) Pulse Rate:  [73-106] 76 (08/23 1531) Resp:  [14-28] 20 (08/23 1531) BP: (110-151)/(50-80) 124/59 (08/23 1531) SpO2:  [89 %-96 %] 94 % (08/23 1531) Arterial Line BP: (117-178)/(52-80) 178/80 (08/22 2300) Physical Exam  Constitutional: He is  oriented to person, place, and time. He appears well-developed and well-nourished. No distress.  HENT:  Mouth/Throat: Oropharynx is clear and moist. No oropharyngeal exudate.  Cardiovascular: Normal rate, regular rhythm and normal heart sounds. Exam reveals no gallop and no friction rub.  No murmur heard.  Pulmonary/Chest: Effort normal and breath sounds normal. No respiratory distress. He has no wheezes.  Abdominal: Soft. Bowel sounds are normal. He exhibits no distension. There is no tenderness.  Ext: left saphenous vein harvest site is c/d/I. Left foot wrapped, no oozing. Warm extremities Neurological: He is alert and oriented to person, place, and time.  Skin: Skin is warm and dry. No rash noted. No erythema.  Psychiatric: He has a normal mood and affect. His behavior is normal.    Lab Results Recent Labs    05/04/21 2353 05/05/21 1231  WBC 10.6* 10.0  HGB 9.7* 9.5*  HCT 28.9* 27.9*  NA 138 133*  K 4.1 4.1  CL 103 100  CO2 27 25  BUN 9 11  CREATININE 1.13 1.20     Microbiology: 8/12 blood cx GBS 8/13 blood cx NGTD Studies/Results: HYBRID OR IMAGING ([facility] ONLY)  Result Date: 05/04/2021 There is no interpretation for this exam.  This order is for images  obtained during a surgical procedure.  Please See "Surgeries" Tab for more information regarding the procedure.     Assessment/Plan: Group b strep bacteremia associated with DFU/osteo of left great toe s/p ray amputation = continue on ceftriaxone 2gm IV daily through 8/26. Should he be discharged to SNF , please see if can discharge with PIV to complete course. Since his amputation on 8/22, he is not considered as having source control, and would just follow dr clark's guidance for wound care management  Left foot abscess= we will follow cultures from 8/22 to see if any other pathogen needs to be treated that are not currently covered by ceftriaxone.  IDDM = recommend good control of diabetes for wound healing  PAD/PVD =  should improve given recent re-vascularization surgery on 8/22  [facility] for Infectious Diseases Cell: [phone] Pager: [phone]  05/05/2021, 4:18 PM

## 2021-05-05 NOTE — Progress Notes (Signed)
Orthopedic Tech Progress Note Patient Details:  ADETOKUNBO MCCADDEN [birth year] [id number]  Called In order to [facility] for a [facility] SHOE  Patient ID: KAVION MANCINAS, male   DOB: 09-14-1945, 75 y.o.   MRN: [id number]  Donald Pore 05/05/2021, 8:33 AM

## 2021-05-05 NOTE — Progress Notes (Signed)
[facility] night shift PCU coverage note.  The nursing staff reported that the patient had a fever 100.1 F earlier this evening and was given acetaminophen 650 mg p.o. 2008.  His most recent temperature increased to 100.4 F.  He is allergic to aspirin and likely other NSAIDs.  Acetaminophen 325 mg p.o. x1 dose ordered.  If not effective cooling measures may be applied as needed.  Sanda Klein, MD

## 2021-05-05 NOTE — TOC Initial Note (Signed)
Transition of Care [facility]) - Initial/Assessment Note    Patient Details  Name: Todd Zhang MRN: [id number] Date of Birth: Jul 08, 1946  Transition of Care [facility]) CM/SW Contact:    Vinie Sill, LCSW Phone Number: 05/05/2021, 4:07 PM  Clinical Narrative:                  CSW met with the patient at bedside. Patient's brother's, Nathaneil Canary his brother's significant other Katharine Look. CSW introduced self and explained role. CSW discussed with patient and family, therapy recommendation of short term rehab at [facility]. Patient in the begging was relcutant to SNF, stating " I'M not going to no nursing home". However, after further conversation with patient and encourage from family, he was agreeable to short term rehab at [facility]. Patient understands he lives home alone and does not have the needed support to safely discharge home. CSW explained the SNF process. Family & patient requested [facility] or SNF near their home. CSW answered all questions.  CSW will provide bed offers once available.  CSW will continue to follow and assist with discharge planning.  Thurmond Butts, MSW, LCSW Clinical Social Worker    Expected Discharge Plan: Skilled Nursing Facility Barriers to Discharge: Continued Medical Work up, Ship broker, SNF Pending bed offer   Patient Goals and CMS Choice        Expected Discharge Plan and Services Expected Discharge Plan: [place] In-house Referral: Clinical Social Work                                            Prior Living Arrangements/Services   Lives with:: Self Patient language and need for interpreter reviewed:: No        Need for Family Participation in Patient Care: Yes (Comment) Care giver support system in place?: Yes (comment)   Criminal Activity/Legal Involvement Pertinent to Current Situation/Hospitalization: No - Comment as needed  Activities of Daily Living Home Assistive Devices/Equipment: Cane (specify quad or  straight), Walker (specify type) ADL Screening (condition at time of admission) Patient's cognitive ability adequate to safely complete daily activities?: Yes Is the patient deaf or have difficulty hearing?: No Does the patient have difficulty seeing, even when wearing glasses/contacts?: No Does the patient have difficulty concentrating, remembering, or making decisions?: No Patient able to express need for assistance with ADLs?: Yes Does the patient have difficulty dressing or bathing?: Yes Independently performs ADLs?: Yes (appropriate for developmental age) Does the patient have difficulty walking or climbing stairs?: Yes Weakness of Legs: Left Weakness of Arms/Hands: None  Permission Sought/Granted Permission sought to share information with : Family Supports    Share Information with NAME: Nichalos Brenton  Permission granted to share info w AGENCY: SNF  Permission granted to share info w Relationship: brother  Permission granted to share info w Contact Information: [phone]  Emotional Assessment Appearance:: Appears stated age Attitude/Demeanor/Rapport: Engaged Affect (typically observed): Accepting, Pleasant, Appropriate Orientation: : Oriented to Self, Oriented to Place, Oriented to  Time, Oriented to Situation Alcohol / Substance Use: Not Applicable Psych Involvement: No (comment)  Admission diagnosis:  Cellulitis and abscess of foot [L03.119, L02.619] MI, acute, non ST segment elevation (HCC) [I21.4] Diabetic foot infection ([place]) [Z66.294, L08.9] Patient Active Problem List   Diagnosis Date Noted   Sepsis due to group B Streptococcus ([place]) 04/26/2021   Cellulitis of great toe, left    Acute  osteomyelitis of toe of left foot ([place]) 04/25/2021   Cellulitis and abscess of foot    Diabetic foot infection ([place]) 04/24/2021   Sepsis due to undetermined organism ([place]) 04/24/2021   Atrial fibrillation with RVR ([place]) 04/24/2021   CKD (chronic kidney disease) stage 3, GFR  30-59 ml/min (HCC) 04/24/2021   Pure hypercholesterolemia 07/16/2020   Peripheral artery disease ([place])    COPD exacerbation ([place]) 05/14/2019   DDD (degenerative disc disease), cervical 11/06/2018   Cervical spinal stenosis 11/06/2018   Generalized anxiety disorder 11/29/2017   Benign prostatic hyperplasia with incomplete bladder emptying 11/28/2017   Gastroesophageal reflux disease without esophagitis 09/07/2017   Essential hypertension 09/01/2017   Onychomycosis 09/01/2017   Type 2 diabetes mellitus with diabetic neuropathy, with long-term current use of insulin ([place]) 06/19/2014   Back pain 06/19/2014   Neck pain 06/19/2014   PCP:  [name], FNP Pharmacy:   [place], [place] [address] [place] [address] Phone: [phone] Fax: [phone]     Social Determinants of Health (SDOH) Interventions    Readmission Risk Interventions No flowsheet data found.

## 2021-05-06 DIAGNOSIS — N1832 Chronic kidney disease, stage 3b: Secondary | ICD-10-CM | POA: Diagnosis not present

## 2021-05-06 DIAGNOSIS — E11628 Type 2 diabetes mellitus with other skin complications: Secondary | ICD-10-CM | POA: Diagnosis not present

## 2021-05-06 DIAGNOSIS — M86172 Other acute osteomyelitis, left ankle and foot: Secondary | ICD-10-CM | POA: Diagnosis not present

## 2021-05-06 DIAGNOSIS — L03119 Cellulitis of unspecified part of limb: Secondary | ICD-10-CM | POA: Diagnosis not present

## 2021-05-06 DIAGNOSIS — I4891 Unspecified atrial fibrillation: Secondary | ICD-10-CM | POA: Diagnosis not present

## 2021-05-06 DIAGNOSIS — R7881 Bacteremia: Secondary | ICD-10-CM | POA: Diagnosis not present

## 2021-05-06 LAB — GLUCOSE, CAPILLARY
Glucose-Capillary: 188 mg/dL — ABNORMAL HIGH (ref 70–99)
Glucose-Capillary: 138 mg/dL — ABNORMAL HIGH (ref 70–99)
Glucose-Capillary: 159 mg/dL — ABNORMAL HIGH (ref 70–99)
Glucose-Capillary: 120 mg/dL — ABNORMAL HIGH (ref 70–99)

## 2021-05-06 MED ORDER — MAGNESIUM SULFATE 4 GM/100ML IV SOLN
4.0000 g | Freq: Once | INTRAVENOUS | Status: AC
  Administered 2021-05-06: 4 g via INTRAVENOUS
  Filled 2021-05-06: qty 100

## 2021-05-06 MED ORDER — INSULIN ASPART 100 UNIT/ML IJ SOLN
0.0000 [IU] | Freq: Three times a day (TID) | INTRAMUSCULAR | Status: DC
  Administered 2021-05-06: 3 [IU] via SUBCUTANEOUS
  Administered 2021-05-06: 2 [IU] via SUBCUTANEOUS
  Administered 2021-05-07: 3 [IU] via SUBCUTANEOUS
  Administered 2021-05-07 – 2021-05-08 (×3): 2 [IU] via SUBCUTANEOUS
  Administered 2021-05-09: 3 [IU] via SUBCUTANEOUS

## 2021-05-06 MED ORDER — INSULIN DETEMIR 100 UNIT/ML ~~LOC~~ SOLN
5.0000 [IU] | Freq: Two times a day (BID) | SUBCUTANEOUS | Status: DC
  Administered 2021-05-06 (×2): 5 [IU] via SUBCUTANEOUS
  Filled 2021-05-06 (×4): qty 0.05

## 2021-05-06 MED ORDER — INSULIN ASPART 100 UNIT/ML IJ SOLN
3.0000 [IU] | Freq: Three times a day (TID) | INTRAMUSCULAR | Status: DC
  Administered 2021-05-07 – 2021-05-09 (×6): 3 [IU] via SUBCUTANEOUS

## 2021-05-06 MED ORDER — INSULIN ASPART 100 UNIT/ML IJ SOLN: 0.0000 [IU] | Freq: Every day | INTRAMUSCULAR | Status: DC

## 2021-05-06 MED ORDER — VANCOMYCIN HCL 2000 MG/400ML IV SOLN
2000.0000 mg | INTRAVENOUS | Status: AC
  Administered 2021-05-06: 2000 mg via INTRAVENOUS
  Filled 2021-05-06: qty 400

## 2021-05-06 MED ORDER — VANCOMYCIN HCL 1750 MG/350ML IV SOLN
1750.0000 mg | INTRAVENOUS | Status: DC
  Filled 2021-05-06: qty 350

## 2021-05-06 NOTE — Progress Notes (Signed)
[facility]    Date of Admission:  04/24/2021      ID: Todd Zhang is a 75 y.o. male with  DFU/left foot osteomyelitis with secondary bacteremia Active Problems:   Diabetic foot infection (HCC)   Sepsis due to undetermined organism [facility])   Atrial fibrillation with RVR (HCC)   CKD (chronic kidney disease) stage 3, GFR 30-59 ml/min (HCC)   Cellulitis and abscess of foot   Acute osteomyelitis of toe of left foot (HCC)   Sepsis due to group B Streptococcus (HCC)   Cellulitis of great toe, left    Subjective: Patient have fever overnight of 100.4, afebrile this morning  OR cultures showing growth, but too soon to identify; wBC still remains at 10K  Medications:   acetaminophen  325 mg Oral Once   atorvastatin  40 mg Oral Daily   clopidogrel  75 mg Oral Daily   diltiazem  240 mg Oral Daily   docusate sodium  100 mg Oral Daily   fenofibrate  160 mg Oral Daily   gabapentin  300 mg Oral TID   guaiFENesin  1,200 mg Oral BID   heparin  5,000 Units Subcutaneous Q8H   insulin aspart  0-15 Units Subcutaneous TID WC   insulin aspart  0-5 Units Subcutaneous QHS   insulin aspart  3 Units Subcutaneous TID WC   insulin detemir  5 Units Subcutaneous BID   loratadine  10 mg Oral Daily   metoprolol succinate  100 mg Oral Daily   multivitamin with minerals  1 tablet Oral Daily   pantoprazole  40 mg Oral Daily   sodium chloride flush  3 mL Intravenous Q12H   tamsulosin  0.4 mg Oral Daily   vitamin B-12  500 mcg Oral Daily    Objective: Vital signs in last 24 hours: Temp:  [97.9 F (36.6 C)-100.4 F (38 C)] 99.5 F (37.5 C) (08/24 0820) Pulse Rate:  [76-102] 90 (08/24 0820) Resp:  [17-20] 20 (08/24 0820) BP: (118-138)/(58-73) 137/58 (08/24 0820) SpO2:  [90 %-94 %] 92 % (08/24 0820)  Physical Exam  Constitutional: He is oriented to person, place, and time. He appears well-developed and well-nourished. No distress.  HENT:  Mouth/Throat: Oropharynx is  clear and moist. No oropharyngeal exudate.  Cardiovascular: Normal rate, regular rhythm and normal heart sounds. Exam reveals no gallop and no friction rub.  No murmur heard.  Pulmonary/Chest: Effort normal and breath sounds normal. No respiratory distress. He has no wheezes.  Abdominal: Soft. Bowel sounds are normal. He exhibits no distension. There is no tenderness.  Lymphadenopathy:  He has no cervical adenopathy.  Neurological: He is alert and oriented to person, place, and time.  Ext: left foot wrapped. Skin incision is c/d/i Skin: Skin is warm and dry. No rash noted. No erythema.  Psychiatric: He has a normal mood and affect. His behavior is normal.    Lab Results Recent Labs    05/04/21 2353 05/05/21 1231  WBC 10.6* 10.0  HGB 9.7* 9.5*  HCT 28.9* 27.9*  NA 138 133*  K 4.1 4.1  CL 103 100  CO2 27 25  BUN 9 11  CREATININE 1.13 1.20   Liver Panel No results for input(s): PROT, ALBUMIN, AST, ALT, ALKPHOS, BILITOT, BILIDIR, IBILI in the last 72 hours. Sedimentation Rate No results for input(s): ESRSEDRATE in the last 72 hours. C-Reactive Protein No results for input(s): CRP in the last 72 hours.  Microbiology: Bone and wound cx showing  e.faecalis Studies/Results: HYBRID OR IMAGING ([facility] ONLY)  Result Date: 05/04/2021 There is no interpretation for this exam.  This order is for images obtained during a surgical procedure.  Please See "Surgeries" Tab for more information regarding the procedure.     Assessment/Plan: Group b strep bacteremia with DFU/osteomyelitis = continue on ceftriaxone 2gm IV daily  DFU/osteomyelitis and foot abscess s/p debridement = will follow culture to see if need to change any antibiotic  Addendum = will do vancomycin for now and see if can talk to family members about penicillin allergy. Patient is unable to answer questions  [facility] Cell: [phone] Pager: [phone]  05/06/2021, 10:49  AM

## 2021-05-06 NOTE — TOC Progression Note (Signed)
Transition of Care [facility]) - Progression Note    Patient Details  Name: Todd Zhang MRN: [id number] Date of Birth: Jun 12, 1946  Transition of Care [facility]) CM/SW Contact  Erin Sons, [place] Phone Number: 05/06/2021, 3:29 PM  Clinical Narrative:     Pt is requesting SNF in [place]. Both [place] facilities are pending in hub. CSW contacted [facility] and [facility] SNFs and requested they review pt for possible admission. They will contact CSW after reviewing.   Expected Discharge Plan: Skilled Nursing Facility Barriers to Discharge: Continued Medical Work up, English as a second language teacher, SNF Pending bed offer  Expected Discharge Plan and Services Expected Discharge Plan: Skilled Nursing Facility In-house Referral: Clinical Social Work                                             Social Determinants of Health (SDOH) Interventions    Readmission Risk Interventions No flowsheet data found.

## 2021-05-06 NOTE — Progress Notes (Addendum)
Progress Note    05/06/2021 6:54 AM 2 Days Post-Op  Subjective:  says he is having pain on the top of his foot just above the ankle  Tm 100.4 now afebrile  Vitals:   05/05/21 2320 05/06/21 0258  BP: 118/62 128/63  Pulse: 94 88  Resp: 17 20  Temp: 98.7 F (37.1 C) 98.7 F (37.1 C)  SpO2: 92% 94%    Physical Exam: Cardiac:  regular Lungs:  non labored Incisions:  left groin and left leg incisions look good.    Extremities:  brisk left PT doppler and monophasic left peroneal doppler.       CBC    Component Value Date/Time   WBC 10.0 05/05/2021 1231   RBC 3.11 (L) 05/05/2021 1231   HGB 9.5 (L) 05/05/2021 1231   HGB 13.5 01/28/2021 1142   HCT 27.9 (L) 05/05/2021 1231   HCT 40.3 01/28/2021 1142   PLT 342 05/05/2021 1231   PLT 185 01/28/2021 1142   MCV 89.7 05/05/2021 1231   MCV 90 01/28/2021 1142   MCH 30.5 05/05/2021 1231   MCHC 34.1 05/05/2021 1231   RDW 13.2 05/05/2021 1231   RDW 12.7 01/28/2021 1142   LYMPHSABS 1.4 05/05/2021 1231   LYMPHSABS 2.6 01/28/2021 1142   MONOABS 0.5 05/05/2021 1231   EOSABS 0.1 05/05/2021 1231   EOSABS 0.1 01/28/2021 1142   BASOSABS 0.1 05/05/2021 1231   BASOSABS 0.0 01/28/2021 1142    BMET    Component Value Date/Time   NA 133 (L) 05/05/2021 1231   NA 137 01/28/2021 1142   K 4.1 05/05/2021 1231   CL 100 05/05/2021 1231   CO2 25 05/05/2021 1231   GLUCOSE 200 (H) 05/05/2021 1231   BUN 11 05/05/2021 1231   BUN 30 (H) 01/28/2021 1142   CREATININE 1.20 05/05/2021 1231   CALCIUM 8.5 (L) 05/05/2021 1231   GFRNONAA >60 05/05/2021 1231   GFRAA 44 (L) 10/17/2020 1434    INR    Component Value Date/Time   INR 1.4 (H) 04/24/2021 1041     Intake/Output Summary (Last 24 hours) at 05/06/2021 0654 Last data filed at 05/06/2021 0147 Gross per 24 hour  Intake 375.44 ml  Output 325 ml  Net 50.44 ml   Specimen Description BONE   Special Requests LEFT RAY TOE   Gram Stain RARE WBC PRESENT,BOTH PMN AND MONONUCLEAR  NO  ORGANISMS SEEN   Culture NO GROWTH < 24 HOURS  Performed at [facility], [address] [place], [place] [address]   Report Status PENDING    Component 2 d ago   Specimen Description ABSCESS LEFT FOOT   Special Requests NONE   Gram Stain RARE WBC PRESENT, PREDOMINANTLY MONONUCLEAR  NO ORGANISMS SEEN   Culture TOO YOUNG TO READ  Performed at [facility], [address], [place], [place] [address]   Report Status PENDING     Assessment/Plan:  75 y.o. male is s/p:  left common iliac stent, femoral endart, and left femoral to PT bypass  2 Days Post-Op   -pt with brisk biphasic left PT doppler signal -bandage removed from left great toe amp site-mild bloody drainage.  Continue wet to dry dressing bid -cultures no growth thus far.  Continue abx per ID -plavix started yesterday -DVT prophylaxis:  heparin sq -continue plavix/statin   Doreatha Massed, PA-C [facility] [phone] 05/06/2021 6:54 AM  I have seen and evaluated the patient. I agree with the PA note as documented  above.  Postop day 2 status post extensive revascularization of left lower extremity.  He has a very brisk PT signal at the ankle.  Wet-to-dry dressing changed again today and this is looking better.  Antibiotics per ID and following cultures.  Plavix for his common iliac stent.  If need to restart anticoagulation for his A. fib we will do heparin without bolus.  Would wait on DOAC.  [name], MD [facility] [place] Office: [phone]

## 2021-05-06 NOTE — Progress Notes (Signed)
Pharmacy Antibiotic Note  Todd Zhang is a 75 y.o. male admitted on 04/24/2021 with diabetic foot infection, L foot osteomyelitis with secondary group B strept bacteremia.  Pharmacy has been consulted by ID for vancomycin dosing for wound infection.  WBC 10, Tmax 100.4 F; Scr 1.20, CrCl 65.2 ml/min (renal function ~stable)  Plan: Vancomycin 2 gm IV X 1, followed by vancomycin 1750 mg IV Q 24 hrs (estimated vancomycin AUC on this regimen, using Scr 1.2, is 458; goal AUC is 400-550) Monitor WBC, temp, clinical improvement, cultures, renal function, vancomycin levels  Height: 6' (182.9 cm) Weight: 100.4 kg (221 lb 5.5 oz) IBW/kg (Calculated) : 77.6  Temp (24hrs), Avg:99 F (37.2 C), Min:98 F (36.7 C), Max:100.4 F (38 C)  Recent Labs  Lab 04/30/21 0250 05/01/21 0603 05/04/21 0900 05/04/21 1930 05/04/21 2353 05/05/21 1231  WBC 10.0  --   --  10.6* 10.6* 10.0  CREATININE 1.30* 1.10 1.23 1.07 1.13 1.20    Estimated Creatinine Clearance: 65.2 mL/min (by C-G formula based on SCr of 1.2 mg/dL).    Allergies  Allergen Reactions   Asa [Aspirin] Hives   Penicillins Hives    Antimicrobials this admission: Ciprofloxacin IV X 1 8/12 Clindamycin IV X 1 8/12 Meropenem 8/12-8/16 Vancomycin 8/12-8/15, 8/24 >> Ceftriaxone 8/16 >>  Microbiology results: 8/12 COVID, flu A, flu B: negative 8/12 Bld cx: Strept agalactiae, suscept to all abx tested except clinda, erythro 8/12 Urine cx: 10,000 colonies/ml Strept agalactiae 8/13 Bld cx X 2: NG/final 8/13 MRSA PCR: negative 8/22 L foot abscess cx: few Enterococcus faecalis, suscept pending 8/22 L ray toe cx (bone): rare Enterococcus faecalis, suscept pending  Thank you for allowing pharmacy to be a part of this patient's care.  Vicki Mallet, PharmD, BCPS, [facility] Clinical Pharmacist 05/06/2021 6:11 PM

## 2021-05-06 NOTE — Progress Notes (Signed)
[facility] PROGRESS NOTE    Progress Note  Todd Zhang  JJK:[id number] DOB: 1946-08-18 DOA: 04/24/2021 PCP: [name], FNP     Brief Narrative:   Todd Zhang is an 75 y.o. male past medical history of uncontrolled diabetes mellitus, essential hypertension hyperlipidemia GERD with a recent visit to [facility] IR 04/21/2021 for encephalopathy who presents to the hospital and admitted for severe sepsis secondary to acute cellulitis and osteomyelitis of the left great toe with abscess blood culture grew staph group B started empirically on antibiotics, 2D echo and TEE was done that showed no vegetation.  ID has been consulted recommended to consult vascular surgery angiogram was done that showed complex peripheral vascular disease, underwent left lower extremity angioplasty and stenting and bypass graft currently on Plavix.    Assessment/Plan:   Sepsis secondary to diabetic foot cellulitis abscess, osteomyelitis and group B strep bacteremia: He is status post aortogram with stenting and bypass for revascularization left, femoral endarterectomy.  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  Currently on Plavix. 2D echo was done that showed no TEE. Continue IV Rocephin.  Appreciate ID's and vascular's assistance. Overnight he spiked a fever of 100.4 since then has remained afebrile no leukocytosis. Physical therapy evaluated the patient recommended skilled nursing facility.  A. fib with RVR: Currently controlled on metoprolol and Cardizem. Currently on Plavix vascular surgery to dictate when to start back anticoagulation.  Diabetes mellitus type 2 with hyperglycemia: With an A1c of 8.3, currently sliding scale insulin, blood glucose trending up.  He is currently on a diet we will start a long-acting insulin change to meal coverage. Continue to hold metformin.  COPD/chronic respiratory failure with hypoxia: Saturations have remained  stable continue 2 L of oxygen to keep saturations greater than 90%.  Hypovolemic hyponatremia: Slight drop today continue to monitor intermittently.  Chronic kidney disease stage IIIb: Creatinine appears to be at baseline.  Essential hypertension: Continue to hold ARB and ACE inhibitor's.  Deconditioning Can restart him in 1 to 2 days of blood pressure starts to trend up.    DVT prophylaxis: lovenox Family Communication:none Status is: Inpatient  Remains inpatient appropriate because:Hemodynamically unstable  Dispo: The patient is from: Home              Anticipated d/c is to: SNF              Patient currently is not medically stable to d/c.   Difficult to place patient No    Code Status:     Code Status Orders  (From admission, onward)           Start     Ordered   04/24/21 1924  Full code  Continuous        04/24/21 1923           Code Status History     This patient has a current code status but no historical code status.         IV Access:   Peripheral IV   Procedures and diagnostic studies:   HYBRID OR IMAGING ([facility] ONLY)  Result Date: 05/04/2021 There is no interpretation for this exam.  This order is for images obtained during a surgical procedure.  Please See "Surgeries" Tab for more information regarding the procedure.     Medical Consultants:   None.   Subjective:    Todd Zhang is spiked a temp overnight, otherwise feels great this morning.  Objective:    Vitals:   05/05/21 2111 05/05/21 2216 05/05/21 2320 05/06/21 0258  BP:   118/62 128/63  Pulse:   94 88  Resp:   17 20  Temp: (!) 100.4 F (38 C) 98.5 F (36.9 C) 98.7 F (37.1 C) 98.7 F (37.1 C)  TempSrc: Oral Oral Oral Oral  SpO2:   92% 94%  Weight:      Height:       SpO2: 94 % O2 Flow Rate (L/min): 2 L/min   Intake/Output Summary (Last 24 hours) at 05/06/2021 0703 Last data filed at 05/06/2021 0701 Gross per 24 hour  Intake 375.44 ml  Output 975 ml   Net -599.56 ml   Filed Weights   04/25/21 2002 04/30/21 1432 05/01/21 0834  Weight: 102.6 kg 100.4 kg 100.4 kg    Exam: General exam: In no acute distress. Respiratory system: Good air movement and clear to auscultation. Cardiovascular system: S1 & S2 heard, RRR. No JVD. Gastrointestinal system: Abdomen is nondistended, soft and nontender.  Extremities: Left foot dressing in place. Skin: No rashes, lesions or ulcers Data Reviewed:    Labs: Basic Metabolic Panel: Recent Labs  Lab 04/30/21 0250 05/01/21 0603 05/04/21 0900 05/04/21 1629 05/04/21 1930 05/04/21 2353 05/05/21 1231  NA 134* 136  --  140  --  138 133*  K 3.5 3.7  --  4.2  --  4.1 4.1  CL 100 105  --   --   --  103 100  CO2 25 24  --   --   --  27 25  GLUCOSE 138* 140*  --   --   --  152* 200*  BUN 15 13  --   --   --  9 11  CREATININE 1.30* 1.10 1.23  --  1.07 1.13 1.20  CALCIUM 8.6* 8.1*  --   --   --  8.4* 8.5*  MG  --   --   --   --   --   --  1.4*   GFR Estimated Creatinine Clearance: 65.2 mL/min (by C-G formula based on SCr of 1.2 mg/dL). Liver Function Tests: Recent Labs  Lab 04/30/21 0250 05/01/21 0603  AST 89* 49*  ALT 68* 56*  ALKPHOS 78 81  BILITOT 0.4 0.4  PROT 5.7* 5.5*  ALBUMIN 2.0* 1.9*   No results for input(s): LIPASE, AMYLASE in the last 168 hours. No results for input(s): AMMONIA in the last 168 hours. Coagulation profile No results for input(s): INR, PROTIME in the last 168 hours. COVID-19 Labs  No results for input(s): DDIMER, FERRITIN, LDH, CRP in the last 72 hours.  Lab Results  Component Value Date   SARSCOV2NAA NEGATIVE 04/24/2021    CBC: Recent Labs  Lab 04/30/21 0250 05/04/21 1629 05/04/21 1930 05/04/21 2353 05/05/21 1231  WBC 10.0  --  10.6* 10.6* 10.0  NEUTROABS 7.4  --   --   --  8.0*  HGB 10.8* 9.2* 9.3* 9.7* 9.5*  HCT 31.5* 27.0* 28.1* 28.9* 27.9*  MCV 89.0  --  91.5 90.9 89.7  PLT 308  --  332 324 342   Cardiac Enzymes: No results for input(s):  CKTOTAL, CKMB, CKMBINDEX, TROPONINI in the last 168 hours. BNP (last 3 results) No results for input(s): PROBNP in the last 8760 hours. CBG: Recent Labs  Lab 05/05/21 0619 05/05/21 1142 05/05/21 1628 05/05/21 2107 05/06/21 0700  GLUCAP 161* 239* 194* 120* 188*   D-Dimer: No results for input(s): DDIMER in  the last 72 hours. Hgb A1c: No results for input(s): HGBA1C in the last 72 hours. Lipid Profile: Recent Labs    05/04/21 2353  CHOL 103  HDL 19*  LDLCALC 50  TRIG 536*  CHOLHDL 5.4   Thyroid function studies: No results for input(s): TSH, T4TOTAL, T3FREE, THYROIDAB in the last 72 hours.  Invalid input(s): FREET3 Anemia work up: No results for input(s): VITAMINB12, FOLATE, FERRITIN, TIBC, IRON, RETICCTPCT in the last 72 hours. Sepsis Labs: Recent Labs  Lab 04/30/21 0250 05/04/21 1930 05/04/21 2353 05/05/21 1231  WBC 10.0 10.6* 10.6* 10.0   Microbiology Recent Results (from the past 240 hour(s))  Aerobic/Anaerobic Culture w Gram Stain (surgical/deep wound)     Status: None (Preliminary result)   Collection Time: 05/04/21  5:57 PM   Specimen: Foot, Left; Abscess  Result Value Ref Range Status   Specimen Description ABSCESS LEFT FOOT  Final   Special Requests NONE  Final   Gram Stain   Final    RARE WBC PRESENT, PREDOMINANTLY MONONUCLEAR NO ORGANISMS SEEN    Culture   Final    TOO YOUNG TO READ Performed at [facility], [address], [place], [place] [address]    Report Status PENDING  Incomplete  Aerobic/Anaerobic Culture w Gram Stain (surgical/deep wound)     Status: None (Preliminary result)   Collection Time: 05/04/21  6:03 PM   Specimen: Bone; Tissue  Result Value Ref Range Status   Specimen Description BONE  Final   Special Requests LEFT RAY TOE  Final   Gram Stain   Final    RARE WBC PRESENT,BOTH PMN AND MONONUCLEAR NO ORGANISMS SEEN    Culture   Final    NO GROWTH < 24 HOURS Performed at [facility], [address],  [place], [place] [address]    Report Status PENDING  Incomplete     Medications:    acetaminophen  325 mg Oral Once   atorvastatin  40 mg Oral Daily   clopidogrel  75 mg Oral Daily   diltiazem  240 mg Oral Daily   docusate sodium  100 mg Oral Daily   fenofibrate  160 mg Oral Daily   gabapentin  300 mg Oral TID   guaiFENesin  1,200 mg Oral BID   heparin  5,000 Units Subcutaneous Q8H   insulin aspart  0-5 Units Subcutaneous QHS   insulin aspart  0-9 Units Subcutaneous TID WC   loratadine  10 mg Oral Daily   metoprolol succinate  100 mg Oral Daily   multivitamin with minerals  1 tablet Oral Daily   pantoprazole  40 mg Oral Daily   sodium chloride flush  3 mL Intravenous Q12H   tamsulosin  0.4 mg Oral Daily   vitamin B-12  500 mcg Oral Daily   Continuous Infusions:  sodium chloride     cefTRIAXone (ROCEPHIN)  IV 200 mL/hr at 05/05/21 1201   magnesium sulfate bolus IVPB        LOS: 12 days   Marinda Elk  [facility]  05/06/2021, 7:03 AM

## 2021-05-06 NOTE — Plan of Care (Signed)
  Problem: Clinical Measurements: Goal: Will remain free from infection Outcome: Progressing Goal: Diagnostic test results will improve Outcome: Progressing   

## 2021-05-07 DIAGNOSIS — R7881 Bacteremia: Secondary | ICD-10-CM | POA: Diagnosis not present

## 2021-05-07 DIAGNOSIS — I4891 Unspecified atrial fibrillation: Secondary | ICD-10-CM | POA: Diagnosis not present

## 2021-05-07 DIAGNOSIS — L03119 Cellulitis of unspecified part of limb: Secondary | ICD-10-CM | POA: Diagnosis not present

## 2021-05-07 DIAGNOSIS — N1832 Chronic kidney disease, stage 3b: Secondary | ICD-10-CM | POA: Diagnosis not present

## 2021-05-07 DIAGNOSIS — M86172 Other acute osteomyelitis, left ankle and foot: Secondary | ICD-10-CM | POA: Diagnosis not present

## 2021-05-07 DIAGNOSIS — E11628 Type 2 diabetes mellitus with other skin complications: Secondary | ICD-10-CM | POA: Diagnosis not present

## 2021-05-07 LAB — BASIC METABOLIC PANEL
Anion gap: 6 (ref 5–15)
BUN: 11 mg/dL (ref 8–23)
CO2: 27 mmol/L (ref 22–32)
Calcium: 8.6 mg/dL — ABNORMAL LOW (ref 8.9–10.3)
Chloride: 101 mmol/L (ref 98–111)
Creatinine, Ser: 1.35 mg/dL — ABNORMAL HIGH (ref 0.61–1.24)
GFR, Estimated: 55 mL/min — ABNORMAL LOW (ref >60)
Glucose, Bld: 154 mg/dL — ABNORMAL HIGH (ref 70–99)
Potassium: 4.3 mmol/L (ref 3.5–5.1)
Sodium: 134 mmol/L — ABNORMAL LOW (ref 135–145)

## 2021-05-07 LAB — GLUCOSE, CAPILLARY
Glucose-Capillary: 126 mg/dL — ABNORMAL HIGH (ref 70–99)
Glucose-Capillary: 157 mg/dL — ABNORMAL HIGH (ref 70–99)
Glucose-Capillary: 128 mg/dL — ABNORMAL HIGH (ref 70–99)
Glucose-Capillary: 121 mg/dL — ABNORMAL HIGH (ref 70–99)

## 2021-05-07 LAB — MAGNESIUM: Magnesium: 2 mg/dL (ref 1.7–2.4)

## 2021-05-07 MED ORDER — LINEZOLID 600 MG PO TABS
600.0000 mg | ORAL_TABLET | Freq: Two times a day (BID) | ORAL | Status: DC
  Administered 2021-05-07 – 2021-05-09 (×5): 600 mg via ORAL
  Filled 2021-05-07 (×6): qty 1

## 2021-05-07 MED ORDER — PROPOFOL 1000 MG/100ML IV EMUL
INTRAVENOUS | Status: AC
  Filled 2021-05-07: qty 200

## 2021-05-07 MED ORDER — PHENYLEPHRINE HCL-NACL 20-0.9 MG/250ML-% IV SOLN
INTRAVENOUS | Status: AC
  Filled 2021-05-07: qty 500

## 2021-05-07 MED ORDER — POLYETHYLENE GLYCOL 3350 17 G PO PACK
17.0000 g | PACK | Freq: Two times a day (BID) | ORAL | Status: AC
  Administered 2021-05-07 (×2): 17 g via ORAL
  Filled 2021-05-07 (×3): qty 1

## 2021-05-07 MED ORDER — ALPRAZOLAM 0.25 MG PO TABS
0.2500 mg | ORAL_TABLET | Freq: Once | ORAL | Status: AC | PRN
  Administered 2021-05-07: 0.25 mg via ORAL
  Filled 2021-05-07: qty 1

## 2021-05-07 MED ORDER — INSULIN DETEMIR 100 UNIT/ML ~~LOC~~ SOLN
7.0000 [IU] | Freq: Two times a day (BID) | SUBCUTANEOUS | Status: DC
  Administered 2021-05-07 – 2021-05-09 (×5): 7 [IU] via SUBCUTANEOUS
  Filled 2021-05-07 (×6): qty 0.07

## 2021-05-07 NOTE — Progress Notes (Signed)
[facility] PROGRESS NOTE    Progress Note  Todd Zhang  DTO:[id number] DOB: Oct 03, 1945 DOA: 04/24/2021 PCP: [name], FNP     Brief Narrative:   Todd Zhang is an 75 y.o. male past medical history of uncontrolled diabetes mellitus, essential hypertension hyperlipidemia GERD with a recent visit to [facility] IR 04/21/2021 for encephalopathy who presents to the hospital and admitted for severe sepsis secondary to acute cellulitis and osteomyelitis of the left great toe with abscess blood culture grew staph group B started empirically on antibiotics, 2D echo and TEE was done that showed no vegetation.  ID has been consulted recommended to consult vascular surgery angiogram was done that showed complex peripheral vascular disease, underwent left lower extremity angioplasty and stenting and bypass graft currently on Plavix.    Assessment/Plan:   Sepsis secondary to diabetic foot cellulitis abscess, osteomyelitis and group B strep bacteremia: He is status post aortogram with stenting and bypass for revascularization left, femoral endarterectomy.  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  Currently on Plavix. 2D echo was done that showed no TEE. He spiked a temperature on 05/05/2021, ID recommended to start vancomycin, continue IV Rocephin. He has defervesced. Physical therapy evaluated the patient and recommended skilled nursing facility.  A. fib with RVR: Currently controlled on metoprolol and Cardizem. Currently on Plavix vascular surgery to dictate when to start back anticoagulation.  Diabetes mellitus type 2 with hyperglycemia: With an A1c of 8.3, currently sliding scale insulin, blood glucose trending up.  Currently tolerating his diet increase long-acting insulin  COPD/chronic respiratory failure with hypoxia: Saturations have remained stable continue 2 L of oxygen to keep saturations greater than 90%.  Hypovolemic  hyponatremia: Slight drop today continue to monitor intermittently.  Chronic kidney disease stage IIIb: Creatinine appears to be at baseline.  Essential hypertension: Continue to hold ARB and ACE inhibitor's.  Deconditioning Can restart him in 1 to 2 days of blood pressure starts to trend up.    DVT prophylaxis: lovenox Family Communication:none Status is: Inpatient  Remains inpatient appropriate because:Hemodynamically unstable  Dispo: The patient is from: Home              Anticipated d/c is to: SNF              Patient currently is not medically stable to d/c.   Difficult to place patient No    Code Status:     Code Status Orders  (From admission, onward)           Start     Ordered   04/24/21 1924  Full code  Continuous        04/24/21 1923           Code Status History     This patient has a current code status but no historical code status.         IV Access:   Peripheral IV   Procedures and diagnostic studies:   No results found.   Medical Consultants:   None.   Subjective:    Todd Zhang no temperatures overnight, he relates he is tolerating his diet pain is controlled has not had a bowel movement.  Objective:    Vitals:   05/06/21 1611 05/06/21 1946 05/06/21 2331 05/07/21 0326  BP: (!) 145/68 (!) 146/68 (!) 124/56 119/65  Pulse: 85 92 86 76  Resp: 18 18 18 19   Temp: 98 F (36.7 C) 98 F (36.7  C) 98 F (36.7 C) 98.3 F (36.8 C)  TempSrc: Oral Oral Oral Oral  SpO2: 96% 92% 90% 90%  Weight:      Height:       SpO2: 90 % O2 Flow Rate (L/min): 2 L/min   Intake/Output Summary (Last 24 hours) at 05/07/2021 0744 Last data filed at 05/07/2021 0534 Gross per 24 hour  Intake 1440 ml  Output 1450 ml  Net -10 ml    Filed Weights   04/25/21 2002 04/30/21 1432 05/01/21 0834  Weight: 102.6 kg 100.4 kg 100.4 kg    Exam: General exam: In no acute distress. Respiratory system: Good air movement and clear to  auscultation. Cardiovascular system: S1 & S2 heard, RRR. No JVD. Gastrointestinal system: Abdomen is nondistended, soft and nontender.  Extremities: No pedal edema. Skin: No rashes, lesions or ulcers  Data Reviewed:    Labs: Basic Metabolic Panel: Recent Labs  Lab 05/01/21 0603 05/04/21 0900 05/04/21 1629 05/04/21 1930 05/04/21 2353 05/05/21 1231 05/07/21 0152  NA 136  --  140  --  138 133* 134*  K 3.7  --  4.2  --  4.1 4.1 4.3  CL 105  --   --   --  103 100 101  CO2 24  --   --   --  27 25 27   GLUCOSE 140*  --   --   --  152* 200* 154*  BUN 13  --   --   --  9 11 11   CREATININE 1.10 1.23  --  1.07 1.13 1.20 1.35*  CALCIUM 8.1*  --   --   --  8.4* 8.5* 8.6*  MG  --   --   --   --   --  1.4*  --     GFR Estimated Creatinine Clearance: 58 mL/min (A) (by C-G formula based on SCr of 1.35 mg/dL (H)). Liver Function Tests: Recent Labs  Lab 05/01/21 0603  AST 49*  ALT 56*  ALKPHOS 81  BILITOT 0.4  PROT 5.5*  ALBUMIN 1.9*    No results for input(s): LIPASE, AMYLASE in the last 168 hours. No results for input(s): AMMONIA in the last 168 hours. Coagulation profile No results for input(s): INR, PROTIME in the last 168 hours. COVID-19 Labs  No results for input(s): DDIMER, FERRITIN, LDH, CRP in the last 72 hours.  Lab Results  Component Value Date   SARSCOV2NAA NEGATIVE 04/24/2021    CBC: Recent Labs  Lab 05/04/21 1629 05/04/21 1930 05/04/21 2353 05/05/21 1231  WBC  --  10.6* 10.6* 10.0  NEUTROABS  --   --   --  8.0*  HGB 9.2* 9.3* 9.7* 9.5*  HCT 27.0* 28.1* 28.9* 27.9*  MCV  --  91.5 90.9 89.7  PLT  --  332 324 342    Cardiac Enzymes: No results for input(s): CKTOTAL, CKMB, CKMBINDEX, TROPONINI in the last 168 hours. BNP (last 3 results) No results for input(s): PROBNP in the last 8760 hours. CBG: Recent Labs  Lab 05/06/21 0700 05/06/21 1128 05/06/21 1613 05/06/21 2114 05/07/21 0612  GLUCAP 188* 138* 159* 120* 126*    D-Dimer: No results  for input(s): DDIMER in the last 72 hours. Hgb A1c: No results for input(s): HGBA1C in the last 72 hours. Lipid Profile: Recent Labs    05/04/21 2353  CHOL 103  HDL 19*  LDLCALC 50  TRIG 05/09/21*  CHOLHDL 5.4    Thyroid function studies: No results for input(s): TSH, T4TOTAL, T3FREE,  THYROIDAB in the last 72 hours.  Invalid input(s): FREET3 Anemia work up: No results for input(s): VITAMINB12, FOLATE, FERRITIN, TIBC, IRON, RETICCTPCT in the last 72 hours. Sepsis Labs: Recent Labs  Lab 05/04/21 1930 05/04/21 2353 05/05/21 1231  WBC 10.6* 10.6* 10.0    Microbiology Recent Results (from the past 240 hour(s))  Aerobic/Anaerobic Culture w Gram Stain (surgical/deep wound)     Status: None (Preliminary result)   Collection Time: 05/04/21  5:57 PM   Specimen: Foot, Left; Abscess  Result Value Ref Range Status   Specimen Description ABSCESS LEFT FOOT  Final   Special Requests NONE  Final   Gram Stain   Final    RARE WBC PRESENT, PREDOMINANTLY MONONUCLEAR NO ORGANISMS SEEN Performed at [facility], [address] [place], [place] [address]    Culture   Final    FEW ENTEROCOCCUS FAECALIS SUSCEPTIBILITIES TO FOLLOW NO ANAEROBES ISOLATED; CULTURE IN PROGRESS FOR 5 DAYS    Report Status PENDING  Incomplete  Aerobic/Anaerobic Culture w Gram Stain (surgical/deep wound)     Status: None (Preliminary result)   Collection Time: 05/04/21  6:03 PM   Specimen: Bone; Tissue  Result Value Ref Range Status   Specimen Description BONE  Final   Special Requests LEFT RAY TOE  Final   Gram Stain   Final    RARE WBC PRESENT,BOTH PMN AND MONONUCLEAR NO ORGANISMS SEEN Performed at [facility], [address], [place], [place] [address]    Culture   Final    RARE ENTEROCOCCUS FAECALIS SUSCEPTIBILITIES TO FOLLOW CRITICAL RESULT CALLED TO, READ BACK BY AND VERIFIED WITH: RN T.HARRIS AT 1422 ON 05/06/2021 BY T.SAAD. NO ANAEROBES ISOLATED; CULTURE IN PROGRESS FOR 5 DAYS     Report Status PENDING  Incomplete     Medications:    acetaminophen  325 mg Oral Once   atorvastatin  40 mg Oral Daily   clopidogrel  75 mg Oral Daily   diltiazem  240 mg Oral Daily   docusate sodium  100 mg Oral Daily   fenofibrate  160 mg Oral Daily   gabapentin  300 mg Oral TID   guaiFENesin  1,200 mg Oral BID   heparin  5,000 Units Subcutaneous Q8H   insulin aspart  0-15 Units Subcutaneous TID WC   insulin aspart  0-5 Units Subcutaneous QHS   insulin aspart  3 Units Subcutaneous TID WC   insulin detemir  5 Units Subcutaneous BID   loratadine  10 mg Oral Daily   metoprolol succinate  100 mg Oral Daily   multivitamin with minerals  1 tablet Oral Daily   pantoprazole  40 mg Oral Daily   sodium chloride flush  3 mL Intravenous Q12H   tamsulosin  0.4 mg Oral Daily   vitamin B-12  500 mcg Oral Daily   Continuous Infusions:  sodium chloride     cefTRIAXone (ROCEPHIN)  IV 2 g (05/06/21 1235)   vancomycin        LOS: 13 days   Marinda Elk  [facility]  05/07/2021, 7:44 AM

## 2021-05-07 NOTE — Progress Notes (Signed)
[facility]    Date of Admission:  04/24/2021      ID: Todd Zhang is a 75 y.o. male with polymicrobial L great toe DFU/osteo with Group B strep bacteremia and PVD s/p revascularization Active Problems:   Diabetic foot infection (HCC)   Sepsis due to undetermined organism (HCC)   Atrial fibrillation with RVR (HCC)   CKD (chronic kidney disease) stage 3, GFR 30-59 ml/min (HCC)   Cellulitis and abscess of foot   Acute osteomyelitis of toe of left foot (HCC)   Sepsis due to group B Streptococcus (HCC)   Cellulitis of great toe, left    Subjective: Afebrile x 24hr. Some mild foot pain  Cx showing VRE  Medications:   acetaminophen  325 mg Oral Once   atorvastatin  40 mg Oral Daily   clopidogrel  75 mg Oral Daily   diltiazem  240 mg Oral Daily   docusate sodium  100 mg Oral Daily   fenofibrate  160 mg Oral Daily   gabapentin  300 mg Oral TID   guaiFENesin  1,200 mg Oral BID   heparin  5,000 Units Subcutaneous Q8H   insulin aspart  0-15 Units Subcutaneous TID WC   insulin aspart  0-5 Units Subcutaneous QHS   insulin aspart  3 Units Subcutaneous TID WC   insulin detemir  7 Units Subcutaneous BID   linezolid  600 mg Oral Q12H   loratadine  10 mg Oral Daily   metoprolol succinate  100 mg Oral Daily   multivitamin with minerals  1 tablet Oral Daily   pantoprazole  40 mg Oral Daily   polyethylene glycol  17 g Oral BID   sodium chloride flush  3 mL Intravenous Q12H   tamsulosin  0.4 mg Oral Daily   vitamin B-12  500 mcg Oral Daily    Objective: Vital signs in last 24 hours: Temp:  [98 F (36.7 C)-98.3 F (36.8 C)] 98.3 F (36.8 C) (08/25 1151) Pulse Rate:  [75-92] 75 (08/25 1151) Resp:  [18-20] 20 (08/25 1151) BP: (110-165)/(48-72) 110/48 (08/25 1151) SpO2:  [90 %-96 %] 96 % (08/25 1151)  Physical Exam  Constitutional: He is oriented to person, place, and time. He appears well-developed and well-nourished. No distress.  HENT:   Mouth/Throat: Oropharynx is clear and moist. No oropharyngeal exudate.  Cardiovascular: Normal rate, regular rhythm and normal heart sounds. Exam reveals no gallop and no friction rub.  No murmur heard.  Pulmonary/Chest: Effort normal and breath sounds normal. No respiratory distress. He has no wheezes.  Abdominal: Soft. Bowel sounds are normal. He exhibits no distension. There is no tenderness.  Lymphadenopathy:  He has no cervical adenopathy.  Neurological: He is alert and oriented to person, place, and time.  Skin: Skin is warm and dry. No rash noted. No erythema.  EXN:TZGY foot wrapped Psychiatric: He has a normal mood and affect. His behavior is normal.    Lab Results Recent Labs    05/04/21 2353 05/05/21 1231 05/07/21 0152  WBC 10.6* 10.0  --   HGB 9.7* 9.5*  --   HCT 28.9* 27.9*  --   NA 138 133* 134*  K 4.1 4.1 4.3  CL 103 100 101  CO2 27 25 27   BUN 9 11 11   CREATININE 1.13 1.20 1.35*    Microbiology: Blood cx group b strep Bone and wound cx vre Studies/Results: No results found.   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 Thus we will do 10 day course of linezolid 600mg  po oral BID. Can stop vancomycin  [facility] Cell: [phone] Pager: [phone]  05/07/2021, 1:07 PM

## 2021-05-07 NOTE — Progress Notes (Signed)
Physical Therapy Treatment Patient Details Name: Todd Zhang MRN: [id number] DOB: [birth year] Today's Date: 05/07/2021    History of Present Illness 75 y.o male admitted 8/12 with diabetic foot infection on L and flank pain. 8/13 uncontrolled a fib with RVR and soft BP. 8/14 transferred to ICU. 8/18 abdominal aortogram on LE. 8/19 TEE. 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 PMH: DM, HTN,HLD, COPD, anxiety, GERD, recent visit to [facility] IR on 04/21/2020 for confusion.    PT Comments    Focused session on progressing OOB mobility. Pt requires increased time to perform all mobility due to pain, weakness, and decreased activity tolerance. Pt was able to progress to ambulating up to ~125 ft with a RW and min guard-minA, but demonstrates balance deficits and poor safety awareness. He requires repeated cues to remain proximal to his RW, place weight through L heel only, and improve his upright posture. He is at high risk for falls. Will continue to follow acutely. Current recommendations remain appropriate.   Follow Up Recommendations  SNF;Supervision for mobility/OOB     Equipment Recommendations  Rolling walker with 5" wheels;3in1 (PT)    Recommendations for Other Services       Precautions / Restrictions Precautions Precautions: Fall Required Braces or Orthoses: Other Brace Other Brace: darco shoe Restrictions Weight Bearing Restrictions: Yes LLE Weight Bearing: Partial weight bearing Other Position/Activity Restrictions: heel weight bearing    Mobility  Bed Mobility Overal bed mobility: Needs Assistance Bed Mobility: Supine to Sit;Sit to Supine     Supine to sit: HOB elevated;Supervision Sit to supine: HOB elevated;Min assist   General bed mobility comments: Extra time and use of bed rails with HOB elevated for all aspects. Supervision and cues to bring legs off EOB to transition  supine > sit. MinA to lift legs to return to supine.    Transfers Overall transfer level: Needs assistance Equipment used: Rolling walker (2 wheeled) Transfers: Sit to/from Stand Sit to Stand: Min guard         General transfer comment: Min guard assist and extra time to power up to stand from EOB > RW. Cued pt to push up from bed but pt insisted on pushing up through anterior aspect of RW to come to stand.  Ambulation/Gait Ambulation/Gait assistance: Min guard;Min assist Gait Distance (Feet): 125 Feet Assistive device: Rolling walker (2 wheeled) Gait Pattern/deviations: Step-to pattern;Step-through pattern;Decreased stride length;Trunk flexed Gait velocity: reduced Gait velocity interpretation: 1.31 - 2.62 ft/sec, indicative of limited community ambulator General Gait Details: Darco shoe donned on L foot and pt's personal shoe donned on R foot, difficulty following cues to maintain precautions and not shift weight to anterior aspect of darco shoe to the point that the front of the shoe is pushing on the ground and he is still placing weight on his forefoot. Pt with trunk flexed, cues to correct with min momentary success. As distance progressed pt became fatigued but increased speed to return to room, needing cues to keep RW proximal to him. Min guard-minA for steadying.   Stairs             Wheelchair Mobility    Modified Rankin (Stroke Patients Only)       Balance Overall balance assessment: Needs assistance Sitting-balance support: No upper extremity supported Sitting balance-Leahy Scale: Good     Standing balance support: Bilateral upper extremity supported Standing balance-Leahy Scale: Poor Standing balance comment: reliant on  at least one UE support                            Cognition Arousal/Alertness: Awake/alert Behavior During Therapy: Flat affect Overall Cognitive Status: Impaired/Different from baseline Area of Impairment: Following  commands;Safety/judgement;Problem solving;Memory;Awareness                     Memory: Decreased recall of precautions Following Commands: Follows one step commands with increased time Safety/Judgement: Decreased awareness of safety;Decreased awareness of deficits Awareness: Emergent Problem Solving: Slow processing;Requires verbal cues General Comments: Pt with difficulty following cues to maintain precautions and not shift weight to anterior aspect of darco shoe to the point that the front of the shoe is pushing on the ground and he is still placing weight on his forefoot. Cues provided to keep RW proximal to him to maintain safety, poor awareness into his deficits and safety.      Exercises      General Comments General comments (skin integrity, edema, etc.): Educated pt on performing ankle pumps and keeping leg elevated      Pertinent Vitals/Pain Pain Assessment: Faces Faces Pain Scale: Hurts even more Pain Location: L leg and L foot at sugical sites Pain Descriptors / Indicators: Grimacing;Guarding;Operative site guarding;Tightness Pain Intervention(s): Limited activity within patient's tolerance;Monitored during session;Repositioned    Home Living                      Prior Function            PT Goals (current goals can now be found in the care plan section) Acute Rehab PT Goals Patient Stated Goal: to improve PT Goal Formulation: With patient Time For Goal Achievement: 05/19/21 Potential to Achieve Goals: Fair Progress towards PT goals: Progressing toward goals    Frequency    Min 3X/week (until SNF confirmed)      PT Plan Current plan remains appropriate    Co-evaluation              AM-PAC PT "6 Clicks" Mobility   Outcome Measure  Help needed turning from your back to your side while in a flat bed without using bedrails?: A Little Help needed moving from lying on your back to sitting on the side of a flat bed without using  bedrails?: A Little Help needed moving to and from a bed to a chair (including a wheelchair)?: A Little Help needed standing up from a chair using your arms (e.g., wheelchair or bedside chair)?: A Little Help needed to walk in hospital room?: A Little Help needed climbing 3-5 steps with a railing? : A Lot 6 Click Score: 17    End of Session Equipment Utilized During Treatment: Gait belt Activity Tolerance: Patient tolerated treatment well Patient left: in bed;with call bell/phone within reach;with bed alarm set Nurse Communication: Mobility status PT Visit Diagnosis: Other abnormalities of gait and mobility (R26.89);Difficulty in walking, not elsewhere classified (R26.2);Muscle weakness (generalized) (M62.81);History of falling (Z91.81);Unsteadiness on feet (R26.81)     Time: 3662-9476 PT Time Calculation (min) (ACUTE ONLY): 31 min  Charges:  $Gait Training: 8-22 mins $Therapeutic Activity: 8-22 mins                     Raymond Gurney, PT, DPT Acute Rehabilitation Services  Pager: [phone] Office: [phone]    Jewel Baize 05/07/2021, 4:11 PM

## 2021-05-07 NOTE — Progress Notes (Addendum)
  Progress Note    05/07/2021 7:43 AM 3 Days Post-Op  Subjective:  no complaints   Vitals:   05/06/21 2331 05/07/21 0326  BP: (!) 124/56 119/65  Pulse: 86 76  Resp: 18 19  Temp: 98 F (36.7 C) 98.3 F (36.8 C)  SpO2: 90% 90%   Physical Exam: Lungs:  non labored Incisions:  L groin and LLE incisions c/d/I; exposed metatarsal but otherwise healthy appearing wound bed Extremities:  brisk PT signal; soft DP and peroneal Neurologic: A&O  CBC    Component Value Date/Time   WBC 10.0 05/05/2021 1231   RBC 3.11 (L) 05/05/2021 1231   HGB 9.5 (L) 05/05/2021 1231   HGB 13.5 01/28/2021 1142   HCT 27.9 (L) 05/05/2021 1231   HCT 40.3 01/28/2021 1142   PLT 342 05/05/2021 1231   PLT 185 01/28/2021 1142   MCV 89.7 05/05/2021 1231   MCV 90 01/28/2021 1142   MCH 30.5 05/05/2021 1231   MCHC 34.1 05/05/2021 1231   RDW 13.2 05/05/2021 1231   RDW 12.7 01/28/2021 1142   LYMPHSABS 1.4 05/05/2021 1231   LYMPHSABS 2.6 01/28/2021 1142   MONOABS 0.5 05/05/2021 1231   EOSABS 0.1 05/05/2021 1231   EOSABS 0.1 01/28/2021 1142   BASOSABS 0.1 05/05/2021 1231   BASOSABS 0.0 01/28/2021 1142    BMET    Component Value Date/Time   NA 134 (L) 05/07/2021 0152   NA 137 01/28/2021 1142   K 4.3 05/07/2021 0152   CL 101 05/07/2021 0152   CO2 27 05/07/2021 0152   GLUCOSE 154 (H) 05/07/2021 0152   BUN 11 05/07/2021 0152   BUN 30 (H) 01/28/2021 1142   CREATININE 1.35 (H) 05/07/2021 0152   CALCIUM 8.6 (L) 05/07/2021 0152   GFRNONAA 55 (L) 05/07/2021 0152   GFRAA 44 (L) 10/17/2020 1434    INR    Component Value Date/Time   INR 1.4 (H) 04/24/2021 1041     Intake/Output Summary (Last 24 hours) at 05/07/2021 0743 Last data filed at 05/07/2021 0534 Gross per 24 hour  Intake 1440 ml  Output 1450 ml  Net -10 ml     Assessment/Plan:  75 y.o. male is s/p left common iliac stent, femoral endart, and left femoral to PT bypass   3 Days Post-Op   L foot well perfused with brisk PT signal and  soft peroneal and DP signal Dressing changed L GT amp site; no change from picture taken yesterday; continue wound care OOB with darco shoe IV antibiotics per ID Dispo: SNF pending increase in mobility   Emilie Rutter, PA-C [facility] [phone] 05/07/2021 7:43 AM  I have seen and evaluated the patient. I agree with the PA note as documented above.  Postop day 3 status post extensive revascularization of left lower extremity.  Wet-to-dry dressing changed again today and wound looks okay.  Brisk PT signal at the ankle and bypass seems to be patent.  Needs aspirin Plavix from my standpoint for left common iliac stent.  OR cultures are growing Enterococcus from bone cultures and wound cultures and sensitivities pending.  Defer to ID for long-term antibiotic plan.  Looks like therapy is recommending SNF.  [name], MD [facility] [place] Office: [phone]

## 2021-05-07 NOTE — Therapy (Signed)
OT Cancellation Note  Patient Details Name: Todd Zhang MRN: [id number] DOB: 06-07-1946   Cancelled Treatment:    Reason Eval/Treat Not Completed: Pain limiting ability to participate. Pt declined, stating he is trying to rest and has bilateral LE leg pain. Pt was asked if OT could let RN know that he is in pain, and he stated he did not want pain medication "I don't want to take it yet" Pt encouraged to use call bell and let staff know if he needs anything. He verbalized understanding. OT goals were reviewed and discussed use of DARCO shoe for OOB activity. Will f/u as schedule and time permits.  Mariam Dollar Beth Dixon, OTR/L 05/07/2021, 12:11 PM

## 2021-05-08 DIAGNOSIS — L03119 Cellulitis of unspecified part of limb: Secondary | ICD-10-CM | POA: Diagnosis not present

## 2021-05-08 DIAGNOSIS — Z89412 Acquired absence of left great toe: Secondary | ICD-10-CM | POA: Insufficient documentation

## 2021-05-08 DIAGNOSIS — E11628 Type 2 diabetes mellitus with other skin complications: Secondary | ICD-10-CM | POA: Diagnosis not present

## 2021-05-08 DIAGNOSIS — R7881 Bacteremia: Secondary | ICD-10-CM | POA: Diagnosis not present

## 2021-05-08 DIAGNOSIS — N1832 Chronic kidney disease, stage 3b: Secondary | ICD-10-CM | POA: Diagnosis not present

## 2021-05-08 DIAGNOSIS — M86172 Other acute osteomyelitis, left ankle and foot: Secondary | ICD-10-CM | POA: Diagnosis not present

## 2021-05-08 DIAGNOSIS — Z89422 Acquired absence of other left toe(s): Secondary | ICD-10-CM | POA: Insufficient documentation

## 2021-05-08 DIAGNOSIS — I4891 Unspecified atrial fibrillation: Secondary | ICD-10-CM | POA: Diagnosis not present

## 2021-05-08 LAB — GLUCOSE, CAPILLARY
Glucose-Capillary: 109 mg/dL — ABNORMAL HIGH (ref 70–99)
Glucose-Capillary: 127 mg/dL — ABNORMAL HIGH (ref 70–99)
Glucose-Capillary: 79 mg/dL (ref 70–99)
Glucose-Capillary: 151 mg/dL — ABNORMAL HIGH (ref 70–99)

## 2021-05-08 LAB — SARS CORONAVIRUS 2 (TAT 6-24 HRS): SARS Coronavirus 2: NEGATIVE

## 2021-05-08 NOTE — Progress Notes (Signed)
Pt. Requested Xanax so he can relax and go to sleep. Pt was visably agitated. MD called and received order for 1 times dose of 0.25 mg xanax. Effective results noted. Wound care completed to left foot. Pt tolerated well. Brynda Rim, RN

## 2021-05-08 NOTE — TOC Progression Note (Addendum)
Transition of Care [facility]) - Progression Note    Patient Details  Name: MALONE VANBLARCOM MRN: [id number] Date of Birth: Dec 20, 1945  Transition of Care [facility]) CM/SW Contact  Eduard Roux, [place] Phone Number: 05/08/2021, 9:58 AM  Clinical Narrative:     CSW left voice message for [facility] Admission Coordinator - requested call back to confirm if they can accept patient over the weekend. CSW awaits return call.  CSW will continue to follow and assist with discharge planning.  Antony Blackbird, MSW, LCSW Clinical Social Worker    Expected Discharge Plan: Skilled Nursing Facility Barriers to Discharge: Continued Medical Work up, English as a second language teacher, SNF Pending bed offer  Expected Discharge Plan and Services Expected Discharge Plan: Skilled Nursing Facility In-house Referral: Clinical Social Work                                             Social Determinants of Health (SDOH) Interventions    Readmission Risk Interventions No flowsheet data found.

## 2021-05-08 NOTE — Progress Notes (Signed)
[facility]    Date of Admission:  04/24/2021      ID: Todd Zhang is a 75 y.o. 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 OR cx showing VRE, and secondary group b strep bacteremia Active Problems:   Diabetic foot infection (HCC)   Sepsis due to undetermined organism (HCC)   Atrial fibrillation with RVR (HCC)   CKD (chronic kidney disease) stage 3, GFR 30-59 ml/min (HCC)   Cellulitis and abscess of foot   Acute osteomyelitis of toe of left foot (HCC)   Sepsis due to group B Streptococcus (HCC)   Cellulitis of great toe, left    Subjective: Remains sleepy this morning. Ate breakfast without difficulty  12 point ros is otherwise negative  Medications:   acetaminophen  325 mg Oral Once   atorvastatin  40 mg Oral Daily   clopidogrel  75 mg Oral Daily   diltiazem  240 mg Oral Daily   docusate sodium  100 mg Oral Daily   fenofibrate  160 mg Oral Daily   gabapentin  300 mg Oral TID   guaiFENesin  1,200 mg Oral BID   heparin  5,000 Units Subcutaneous Q8H   insulin aspart  0-15 Units Subcutaneous TID WC   insulin aspart  0-5 Units Subcutaneous QHS   insulin aspart  3 Units Subcutaneous TID WC   insulin detemir  7 Units Subcutaneous BID   linezolid  600 mg Oral Q12H   loratadine  10 mg Oral Daily   metoprolol succinate  100 mg Oral Daily   multivitamin with minerals  1 tablet Oral Daily   pantoprazole  40 mg Oral Daily   polyethylene glycol  17 g Oral BID   sodium chloride flush  3 mL Intravenous Q12H   tamsulosin  0.4 mg Oral Daily   vitamin B-12  500 mcg Oral Daily    Objective: Vital signs in last 24 hours: Temp:  [97.9 F (36.6 C)-98.6 F (37 C)] 97.9 F (36.6 C) (08/26 0846) Pulse Rate:  [75-106] 106 (08/26 0846) Resp:  [18-23] 20 (08/26 0846) BP: (108-149)/(47-86) 118/47 (08/26 0846) SpO2:  [88 %-100 %] 100 % (08/26  0846)  Physical Exam  Constitutional: He is oriented to person, place. He appears well-developed and well-nourished. No distress.  HENT:  Mouth/Throat: Oropharynx is clear and moist. No oropharyngeal exudate.  Cardiovascular: Normal rate, regular rhythm and normal heart sounds. Exam reveals no gallop and no friction rub.  No murmur heard.  Pulmonary/Chest: Effort normal and breath sounds normal. No respiratory distress. He has no wheezes.  Abdominal: Soft. Bowel sounds are normal. He exhibits no distension. There is no tenderness.  Lymphadenopathy:  He has no cervical adenopathy.  Neurological: He is alert and oriented to person, place. Decreased sensation to feet Ext: left foot wrapped Skin: Skin is warm and dry. No rash noted. No erythema. Saphenous vein harvest is c/d/i Psychiatric: He has a normal mood and affect. His behavior is normal.    Lab Results Recent Labs    05/05/21 1231 05/07/21 0152  WBC 10.0  --   HGB 9.5*  --   HCT 27.9*  --   NA 133* 134*  K 4.1 4.3  CL 100 101  CO2 25 27  BUN 11 11  CREATININE 1.20 1.35*   Liver Panel No results for input(s): PROT, ALBUMIN, AST, ALT, ALKPHOS, BILITOT, BILIDIR, IBILI in  the last 72 hours. Sedimentation Rate No results for input(s): ESRSEDRATE in the last 72 hours. C-Reactive Protein No results for input(s): CRP in the last 72 hours.  Microbiology: Bone and wound cx showing VRE Studies/Results: No results found.   Assessment/Plan: Group b strep bacteremia = today is last day of ceftriaxone 2gm IV daily to complete 14 day since clearance of bacteremia  VRE DFU/osteomyelitis = has source control for osteomyelitis. Will plan to treat with 10d of linezolid 600mg  po bid. Today is day 2. Recommend repeat CBC prior to discharge to ensure not having thrombocytopenia due to linezolid  Critical limb ischemia s/p revascularization = defer to dr in terms of dressing changes and wound care for when patient is going to  SNF  Penicillin allergy = he recalls hives, and acute nausea and vomiting after exposure to abtx. Will not re-introduce during this visit.  Will sign off.   [facility] for Infectious Diseases Cell: [phone] Pager: [phone]  05/08/2021, 10:04 AM

## 2021-05-08 NOTE — TOC Progression Note (Signed)
Transition of Care [facility]) - Progression Note    Patient Details  Name: Todd Zhang MRN: [id number] Date of Birth: December 21, 1945  Transition of Care [facility]) CM/SW Contact  Eduard Roux, [place] Phone Number: 05/08/2021, 1:07 PM  Clinical Narrative:     [facility] confirmed they can admit patient over the weekend, providing we receive insurance authorization.  CSW has started insurance auth # [id number]  CSW requested covid test   [facility] will continue to follow and assist with discharge planning.  Antony Blackbird, MSW, LCSW Clinical Social Worker     Expected Discharge Plan: Skilled Nursing Facility Barriers to Discharge: Continued Medical Work up, English as a second language teacher, SNF Pending bed offer  Expected Discharge Plan and Services Expected Discharge Plan: Skilled Nursing Facility In-house Referral: Clinical Social Work                                             Social Determinants of Health (SDOH) Interventions    Readmission Risk Interventions No flowsheet data found.

## 2021-05-08 NOTE — Progress Notes (Signed)
[facility] PROGRESS NOTE    Progress Note  Todd Zhang  GYI:[id number] DOB: [birth year] DOA: 04/24/2021 PCP: [name], FNP     Brief Narrative:   Todd Zhang is an 75 y.o. male past medical history of uncontrolled diabetes mellitus, essential hypertension hyperlipidemia GERD with a recent visit to [facility] 04/21/2021 for encephalopathy who presents to the hospital and admitted for severe sepsis secondary to acute cellulitis and osteomyelitis of the left great toe with abscess blood culture grew staph group B started empirically on antibiotics, 2D echo and TEE was done that showed no vegetation.  ID has been consulted recommended to consult vascular surgery angiogram was done that showed complex peripheral vascular disease, underwent left lower extremity angioplasty and stenting and bypass graft currently on Plavix.    Assessment/Plan:   Sepsis secondary to diabetic foot cellulitis abscess, osteomyelitis and group B strep bacteremia: He is status post aortogram with stenting and bypass for revascularization left, femoral endarterectomy.  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 Currently on Plavix. 2D echo was done that showed no vegetation. ID is on board.  He has been transition to linezolid which she will continue for 10 days, has remained afebrile. For his group B strep strep bacteremia we will continue IV Rocephin for total of 14 days. Physical therapy evaluated the patient and recommended skilled nursing facility.  A. fib with RVR: Currently controlled on metoprolol and Cardizem. Currently on Plavix vascular surgery to dictate when to start back anticoagulation.  Diabetes mellitus type 2 with hyperglycemia: With an A1c of 8.3, currently well controlled on sliding scale insulin plus long-acting insulin.  COPD/chronic respiratory failure with hypoxia: Saturations have remained stable continue 2 L of oxygen to keep saturations  greater than 90%.  Hypovolemic hyponatremia: Slight drop today continue to monitor intermittently.  Chronic kidney disease stage IIIb: Creatinine appears to be at baseline.  Essential hypertension: Continue to hold ARB and ACE inhibitor's.  Deconditioning Physical therapy evaluated the patient the recommended skilled nursing facility.    DVT prophylaxis: lovenox Family Communication:none Status is: Inpatient  Remains inpatient appropriate because:Hemodynamically unstable  Dispo: The patient is from: Home              Anticipated d/c is to: SNF              Patient currently is not medically stable to d/c.   Difficult to place patient No    Code Status:     Code Status Orders  (From admission, onward)           Start     Ordered   04/24/21 1924  Full code  Continuous        04/24/21 1923           Code Status History     This patient has a current code status but no historical code status.         IV Access:   Peripheral IV   Procedures and diagnostic studies:   No results found.   Medical Consultants:   None.   Subjective:    Todd BUENGER feels great no complaints agreed to go to skilled  Objective:    Vitals:   05/07/21 1648 05/07/21 2004 05/07/21 2337 05/08/21 0412  BP: (!) 108/57 (!) 149/86 129/67 130/72  Pulse: 75 81 87 89  Resp: 18 (!) 23 (!) 21 (!) 21  Temp: 98.3 F (36.8 C) 98.3 F (36.8 C)  98.6 F (37 C) 98.6 F (37 C)  TempSrc: Oral Oral Oral Oral  SpO2: 93% 96% 92% (!) 88%  Weight:      Height:       SpO2: (!) 88 % O2 Flow Rate (L/min): 2 L/min   Intake/Output Summary (Last 24 hours) at 05/08/2021 0807 Last data filed at 05/08/2021 0622 Gross per 24 hour  Intake 480 ml  Output 2300 ml  Net -1820 ml    Filed Weights   04/25/21 2002 04/30/21 1432 05/01/21 0834  Weight: 102.6 kg 100.4 kg 100.4 kg    Exam: General exam: In no acute distress. Respiratory system: Good air movement and clear to  auscultation. Cardiovascular system: S1 & S2 heard, RRR. No JVD. Gastrointestinal system: Abdomen is nondistended, soft and nontender.  Extremities: No pedal edema. Skin: No rashes, lesions or ulcers Psychiatry: Judgement and insight appear normal. Mood & affect appropriate.  Data Reviewed:    Labs: Basic Metabolic Panel: Recent Labs  Lab 05/04/21 0900 05/04/21 1629 05/04/21 1629 05/04/21 1930 05/04/21 2353 05/05/21 1231 05/07/21 0152  NA  --  140  --   --  138 133* 134*  K  --  4.2   < >  --  4.1 4.1 4.3  CL  --   --   --   --  103 100 101  CO2  --   --   --   --  27 25 27   GLUCOSE  --   --   --   --  152* 200* 154*  BUN  --   --   --   --  9 11 11   CREATININE 1.23  --   --  1.07 1.13 1.20 1.35*  CALCIUM  --   --   --   --  8.4* 8.5* 8.6*  MG  --   --   --   --   --  1.4* 2.0   < > = values in this interval not displayed.    GFR Estimated Creatinine Clearance: 58 mL/min (A) (by C-G formula based on SCr of 1.35 mg/dL (H)). Liver Function Tests: No results for input(s): AST, ALT, ALKPHOS, BILITOT, PROT, ALBUMIN in the last 168 hours.  No results for input(s): LIPASE, AMYLASE in the last 168 hours. No results for input(s): AMMONIA in the last 168 hours. Coagulation profile No results for input(s): INR, PROTIME in the last 168 hours. COVID-19 Labs  No results for input(s): DDIMER, FERRITIN, LDH, CRP in the last 72 hours.  Lab Results  Component Value Date   SARSCOV2NAA NEGATIVE 04/24/2021    CBC: Recent Labs  Lab 05/04/21 1629 05/04/21 1930 05/04/21 2353 05/05/21 1231  WBC  --  10.6* 10.6* 10.0  NEUTROABS  --   --   --  8.0*  HGB 9.2* 9.3* 9.7* 9.5*  HCT 27.0* 28.1* 28.9* 27.9*  MCV  --  91.5 90.9 89.7  PLT  --  332 324 342    Cardiac Enzymes: No results for input(s): CKTOTAL, CKMB, CKMBINDEX, TROPONINI in the last 168 hours. BNP (last 3 results) No results for input(s): PROBNP in the last 8760 hours. CBG: Recent Labs  Lab 05/07/21 0612  05/07/21 1146 05/07/21 1644 05/07/21 2116 05/08/21 0555  GLUCAP 126* 157* 128* 121* 109*    D-Dimer: No results for input(s): DDIMER in the last 72 hours. Hgb A1c: No results for input(s): HGBA1C in the last 72 hours. Lipid Profile: No results for input(s): CHOL, HDL, LDLCALC, TRIG, CHOLHDL,  LDLDIRECT in the last 72 hours.  Thyroid function studies: No results for input(s): TSH, T4TOTAL, T3FREE, THYROIDAB in the last 72 hours.  Invalid input(s): FREET3 Anemia work up: No results for input(s): VITAMINB12, FOLATE, FERRITIN, TIBC, IRON, RETICCTPCT in the last 72 hours. Sepsis Labs: Recent Labs  Lab 05/04/21 1930 05/04/21 2353 05/05/21 1231  WBC 10.6* 10.6* 10.0    Microbiology Recent Results (from the past 240 hour(s))  Aerobic/Anaerobic Culture w Gram Stain (surgical/deep wound)     Status: None (Preliminary result)   Collection Time: 05/04/21  5:57 PM   Specimen: Foot, Left; Abscess  Result Value Ref Range Status   Specimen Description ABSCESS LEFT FOOT  Final   Special Requests NONE  Final   Gram Stain   Final    RARE WBC PRESENT, PREDOMINANTLY MONONUCLEAR NO ORGANISMS SEEN Performed at [facility], [address] [place], [place] [address]    Culture   Final    FEW ENTEROCOCCUS FAECALIS VANCOMYCIN RESISTANT ENTEROCOCCUS NO ANAEROBES ISOLATED; CULTURE IN PROGRESS FOR 5 DAYS    Report Status PENDING  Incomplete   Organism ID, Bacteria ENTEROCOCCUS FAECALIS  Final      Susceptibility   Enterococcus faecalis - MIC*    AMPICILLIN <=2 SENSITIVE Sensitive     VANCOMYCIN >=32 RESISTANT Resistant     GENTAMICIN SYNERGY SENSITIVE Sensitive     LINEZOLID 2 SENSITIVE Sensitive     * FEW ENTEROCOCCUS FAECALIS  Aerobic/Anaerobic Culture w Gram Stain (surgical/deep wound)     Status: None (Preliminary result)   Collection Time: 05/04/21  6:03 PM   Specimen: Bone; Tissue  Result Value Ref Range Status   Specimen Description BONE  Final   Special Requests LEFT  RAY TOE  Final   Gram Stain   Final    RARE WBC PRESENT,BOTH PMN AND MONONUCLEAR NO ORGANISMS SEEN Performed at [facility], [address] [place], [place] [address]    Culture   Final    RARE ENTEROCOCCUS FAECALIS SUSCEPTIBILITIES TO FOLLOW CRITICAL RESULT CALLED TO, READ BACK BY AND VERIFIED WITH: RN T.HARRIS AT 1422 ON 05/06/2021 BY T.SAAD. NO ANAEROBES ISOLATED; CULTURE IN PROGRESS FOR 5 DAYS    Report Status PENDING  Incomplete     Medications:    acetaminophen  325 mg Oral Once   atorvastatin  40 mg Oral Daily   clopidogrel  75 mg Oral Daily   diltiazem  240 mg Oral Daily   docusate sodium  100 mg Oral Daily   fenofibrate  160 mg Oral Daily   gabapentin  300 mg Oral TID   guaiFENesin  1,200 mg Oral BID   heparin  5,000 Units Subcutaneous Q8H   insulin aspart  0-15 Units Subcutaneous TID WC   insulin aspart  0-5 Units Subcutaneous QHS   insulin aspart  3 Units Subcutaneous TID WC   insulin detemir  7 Units Subcutaneous BID   linezolid  600 mg Oral Q12H   loratadine  10 mg Oral Daily   metoprolol succinate  100 mg Oral Daily   multivitamin with minerals  1 tablet Oral Daily   pantoprazole  40 mg Oral Daily   polyethylene glycol  17 g Oral BID   sodium chloride flush  3 mL Intravenous Q12H   tamsulosin  0.4 mg Oral Daily   vitamin B-12  500 mcg Oral Daily   Continuous Infusions:  sodium chloride     cefTRIAXone (ROCEPHIN)  IV 2 g (05/07/21 1216)  LOS: 14 days   Marinda Elk  [facility]  05/08/2021, 8:07 AM

## 2021-05-08 NOTE — Progress Notes (Addendum)
  Progress Note    05/08/2021 9:47 AM 4 Days Post-Op  Subjective:  no complaints  afebrile  Vitals:   05/08/21 0412 05/08/21 0846  BP: 130/72 (!) 118/47  Pulse: 89 (!) 106  Resp: (!) 21 20  Temp: 98.6 F (37 C) 97.9 F (36.6 C)  SpO2: (!) 88% 100%    Physical Exam: Cardiac:  regular Lungs:  non labored Incisions:  groin incsions is clean; left foot wound looks better today Extremities:  brisk left PT doppler signal; erythema improved  CBC    Component Value Date/Time   WBC 10.0 05/05/2021 1231   RBC 3.11 (L) 05/05/2021 1231   HGB 9.5 (L) 05/05/2021 1231   HGB 13.5 01/28/2021 1142   HCT 27.9 (L) 05/05/2021 1231   HCT 40.3 01/28/2021 1142   PLT 342 05/05/2021 1231   PLT 185 01/28/2021 1142   MCV 89.7 05/05/2021 1231   MCV 90 01/28/2021 1142   MCH 30.5 05/05/2021 1231   MCHC 34.1 05/05/2021 1231   RDW 13.2 05/05/2021 1231   RDW 12.7 01/28/2021 1142   LYMPHSABS 1.4 05/05/2021 1231   LYMPHSABS 2.6 01/28/2021 1142   MONOABS 0.5 05/05/2021 1231   EOSABS 0.1 05/05/2021 1231   EOSABS 0.1 01/28/2021 1142   BASOSABS 0.1 05/05/2021 1231   BASOSABS 0.0 01/28/2021 1142    BMET    Component Value Date/Time   NA 134 (L) 05/07/2021 0152   NA 137 01/28/2021 1142   K 4.3 05/07/2021 0152   CL 101 05/07/2021 0152   CO2 27 05/07/2021 0152   GLUCOSE 154 (H) 05/07/2021 0152   BUN 11 05/07/2021 0152   BUN 30 (H) 01/28/2021 1142   CREATININE 1.35 (H) 05/07/2021 0152   CALCIUM 8.6 (L) 05/07/2021 0152   GFRNONAA 55 (L) 05/07/2021 0152   GFRAA 44 (L) 10/17/2020 1434    INR    Component Value Date/Time   INR 1.4 (H) 04/24/2021 1041     Intake/Output Summary (Last 24 hours) at 05/08/2021 0947 Last data filed at 05/08/2021 0844 Gross per 24 hour  Intake 480 ml  Output 2800 ml  Net -2320 ml     Assessment/Plan:  75 y.o. male is s/p:  left common iliac stent, femoral endart, and left femoral to PT bypass and toe amputation  4 Days Post-Op   -brisk left PT  doppler signal -wound improved and foot overall looks better today.  Continue bid wet to dry dressing changes -ok to restart anticoagulation -ok for discharge when bed available -f/u with [facility] in a couple of weeks.  Our office will arrange appt.     Doreatha Massed, PA-C [facility] [phone] 05/08/2021 9:47 AM  I have seen and evaluated the patient. I agree with the PA note as documented above.  75 year old male status post extensive revascularization of left lower extremity.  All of his incisions look good.  He has a brisk PT signal in the foot.  Dressing changed this morning and wound continues to improve.  Will need twice daily dressing changes at discharge.  Okay to restart anticoagulation.  We will also need aspirin Plavix at discharge for his left iliac stent.  [name], MD [facility] [place] Office: [phone]

## 2021-05-08 NOTE — Progress Notes (Signed)
Occupational Therapy Treatment Patient Details Name: Todd Zhang MRN: [id number] DOB: Apr 16, 1946 Today's Date: 05/08/2021    History of present illness 75 y.o male admitted 8/12 with diabetic foot infection on L and flank pain. 8/13 uncontrolled a fib with RVR and soft BP. 8/14 transferred to ICU. 8/18 abdominal aortogram on LE. 8/19 TEE. 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 PMH: DM, HTN,HLD, COPD, anxiety, GERD, recent visit to [facility] on 04/21/2020 for confusion.   OT comments  Pt. Was cooperative with therapy. Pt. Was able to preform adls  sitting eob. Pt. Required assist to don darco boot. Pt. Had good sitting balance eob. Pt required cues for wb and hand placement for sit to stand and transfer. Acute ot to follow.   Follow Up Recommendations  SNF    Equipment Recommendations  3 in 1 bedside commode    Recommendations for Other Services      Precautions / Restrictions Precautions Precautions: Fall Other Brace: darco shoe Restrictions LLE Weight Bearing: Partial weight bearing (has darco shoe. allowed wb on back of foot only.)       Mobility Bed Mobility         Supine to sit: HOB elevated;Supervision          Transfers Overall transfer level: Needs assistance Equipment used: Rolling walker (2 wheeled) Transfers: Sit to/from Stand Sit to Stand: Min guard         General transfer comment: cues for wb precautions and proper hand placement.    Balance     Sitting balance-Leahy Scale: Good       Standing balance-Leahy Scale: Fair                             ADL either performed or assessed with clinical judgement   ADL Overall ADL's : Needs assistance/impaired Eating/Feeding: Independent   Grooming: Wash/dry hands;Wash/dry face;Sitting;Set up;Applying deodorant   Upper Body Bathing: Sitting;Set up   Lower Body Bathing: Minimal  assistance;Sit to/from stand   Upper Body Dressing : Set up;Sitting   Lower Body Dressing: Minimal assistance;Sit to/from stand   Toilet Transfer: Min guard;Ambulation;RW;Regular Social worker and Hygiene: Min guard;Sit to/from stand;Sitting/lateral lean       Functional mobility during ADLs: Min guard;Rolling walker;Cueing for sequencing;Cueing for safety (cues for wb precautions.) General ADL Comments: Pt with significant deficits in safety awareness, need for WB precautions, medical complexities, etc.     Vision   Vision Assessment?: No apparent visual deficits   Perception     Praxis      Cognition Arousal/Alertness: Awake/alert Behavior During Therapy: Flat affect Overall Cognitive Status: Impaired/Different from baseline Area of Impairment: Following commands;Safety/judgement;Problem solving;Memory;Awareness                     Memory: Decreased recall of precautions Following Commands: Follows one step commands with increased time Safety/Judgement: Decreased awareness of safety;Decreased awareness of deficits   Problem Solving: Slow processing          Exercises     Shoulder Instructions       General Comments      Pertinent Vitals/ Pain       Pain Assessment: 0-10 Pain Score: 7  Pain Location: L leg and L foot at sugical sites Pain Descriptors / Indicators: Aching Pain Intervention(s): Limited activity within patient's tolerance  Home Living                                          Prior Functioning/Environment              Frequency  Min 2X/week        Progress Toward Goals  OT Goals(current goals can now be found in the care plan section)  Progress towards OT goals: Progressing toward goals  Acute Rehab OT Goals Patient Stated Goal: get better OT Goal Formulation: With patient Time For Goal Achievement: 05/19/21 Potential to Achieve Goals: Good ADL Goals Pt Will Perform  Grooming: with modified independence;standing Pt Will Perform Lower Body Bathing: with modified independence;sit to/from stand Pt Will Perform Lower Body Dressing: with modified independence;sit to/from stand Pt Will Transfer to Toilet: with modified independence;ambulating  Plan Discharge plan remains appropriate    Co-evaluation                 AM-PAC OT "6 Clicks" Daily Activity     Outcome Measure   Help from another person eating meals?: None Help from another person taking care of personal grooming?: A Little Help from another person toileting, which includes using toliet, bedpan, or urinal?: A Little Help from another person bathing (including washing, rinsing, drying)?: A Little Help from another person to put on and taking off regular upper body clothing?: A Little Help from another person to put on and taking off regular lower body clothing?: A Little 6 Click Score: 19    End of Session Equipment Utilized During Treatment: Rolling walker;Oxygen  OT Visit Diagnosis: Unsteadiness on feet (R26.81);Other abnormalities of gait and mobility (R26.89);Pain;Muscle weakness (generalized) (M62.81);Other symptoms and signs involving cognitive function Pain - Right/Left: Left Pain - part of body: Ankle and joints of foot   Activity Tolerance Patient tolerated treatment well   Patient Left in chair;with call bell/phone within reach;with chair alarm set;with nursing/sitter in room   Nurse Communication  (ok therapy)        Time: 4174-0814 OT Time Calculation (min): 41 min  Charges: OT General Charges $OT Visit: 1 Visit OT Treatments $Self Care/Home Management : 38-52 mins  Derrek Gu OT/L    Cheryn Lundquist 05/08/2021, 12:33 PM

## 2021-05-09 DIAGNOSIS — A401 Sepsis due to streptococcus, group B: Secondary | ICD-10-CM | POA: Diagnosis not present

## 2021-05-09 DIAGNOSIS — N1832 Chronic kidney disease, stage 3b: Secondary | ICD-10-CM | POA: Diagnosis not present

## 2021-05-09 DIAGNOSIS — L03119 Cellulitis of unspecified part of limb: Secondary | ICD-10-CM | POA: Diagnosis not present

## 2021-05-09 DIAGNOSIS — I214 Non-ST elevation (NSTEMI) myocardial infarction: Secondary | ICD-10-CM

## 2021-05-09 DIAGNOSIS — M86172 Other acute osteomyelitis, left ankle and foot: Secondary | ICD-10-CM | POA: Diagnosis not present

## 2021-05-09 LAB — BASIC METABOLIC PANEL
Anion gap: 8 (ref 5–15)
BUN: 11 mg/dL (ref 8–23)
CO2: 25 mmol/L (ref 22–32)
Calcium: 9 mg/dL (ref 8.9–10.3)
Chloride: 104 mmol/L (ref 98–111)
Creatinine, Ser: 1.36 mg/dL — ABNORMAL HIGH (ref 0.61–1.24)
GFR, Estimated: 54 mL/min — ABNORMAL LOW (ref >60)
Glucose, Bld: 189 mg/dL — ABNORMAL HIGH (ref 70–99)
Potassium: 4.5 mmol/L (ref 3.5–5.1)
Sodium: 137 mmol/L (ref 135–145)

## 2021-05-09 LAB — GLUCOSE, CAPILLARY
Glucose-Capillary: 101 mg/dL — ABNORMAL HIGH (ref 70–99)
Glucose-Capillary: 157 mg/dL — ABNORMAL HIGH (ref 70–99)

## 2021-05-09 MED ORDER — HYDROCODONE-ACETAMINOPHEN 5-325 MG PO TABS: 1.0000 | ORAL_TABLET | ORAL | 0 refills | Status: DC | PRN

## 2021-05-09 MED ORDER — LINEZOLID 600 MG PO TABS: 600.0000 mg | ORAL_TABLET | Freq: Two times a day (BID) | ORAL | 0 refills | Status: AC

## 2021-05-09 MED ORDER — DILTIAZEM HCL ER COATED BEADS 240 MG PO CP24: 240.0000 mg | ORAL_CAPSULE | Freq: Every day | ORAL | Status: DC

## 2021-05-09 MED ORDER — CLOPIDOGREL BISULFATE 75 MG PO TABS: 75.0000 mg | ORAL_TABLET | Freq: Every day | ORAL | Status: DC

## 2021-05-09 MED ORDER — RIVAROXABAN 20 MG PO TABS
20.0000 mg | ORAL_TABLET | Freq: Every day | ORAL | Status: DC
  Administered 2021-05-09: 20 mg via ORAL
  Filled 2021-05-09: qty 1

## 2021-05-09 NOTE — TOC Progression Note (Signed)
Transition of Care [facility]) - Progression Note    Patient Details  Name: Todd Zhang MRN: [id number] Date of Birth: 12/28/1945  Transition [facility] Care [facility] CM/SW Contact  Patrice [place], [place] Phone Number: [phone] 05/09/2021, 9:59 AM  Clinical Narrative:     Pt's authorization has been approved.  [employer] #- [id number] [employer] #- [id number] Authorization dates: 8/27-8/30 Fax Number: [phone]    TOC team will continue to assist with discharge planning needs.   Expected Discharge Plan: Skilled Nursing Facility Barriers to Discharge: Continued Medical Work up, English as a second language teacher, SNF Pending bed offer  Expected Discharge Plan and Services Expected Discharge Plan: Skilled Nursing Facility In-house Referral: Clinical Social Work                                             Social Determinants of Health (SDOH) Interventions    Readmission Risk Interventions No flowsheet data found.

## 2021-05-09 NOTE — Progress Notes (Addendum)
  Progress Note    05/09/2021 7:01 AM 5 Days Post-Op  Subjective:  says his foot hurts but not any worse.   afebrile  Vitals:   05/08/21 2345 05/09/21 0407  BP: (!) 147/64 111/68  Pulse: 96 69  Resp: 17 19  Temp: 98.4 F (36.9 C) 98.8 F (37.1 C)  SpO2: 94% 90%    Physical Exam: Cardiac:  regular Lungs:  non labored Incisions:  healing nicely Extremities:  +doppler signal left PT/AT;  foot dressing not removed this morning   CBC    Component Value Date/Time   WBC 10.0 05/05/2021 1231   RBC 3.11 (L) 05/05/2021 1231   HGB 9.5 (L) 05/05/2021 1231   HGB 13.5 01/28/2021 1142   HCT 27.9 (L) 05/05/2021 1231   HCT 40.3 01/28/2021 1142   PLT 342 05/05/2021 1231   PLT 185 01/28/2021 1142   MCV 89.7 05/05/2021 1231   MCV 90 01/28/2021 1142   MCH 30.5 05/05/2021 1231   MCHC 34.1 05/05/2021 1231   RDW 13.2 05/05/2021 1231   RDW 12.7 01/28/2021 1142   LYMPHSABS 1.4 05/05/2021 1231   LYMPHSABS 2.6 01/28/2021 1142   MONOABS 0.5 05/05/2021 1231   EOSABS 0.1 05/05/2021 1231   EOSABS 0.1 01/28/2021 1142   BASOSABS 0.1 05/05/2021 1231   BASOSABS 0.0 01/28/2021 1142    BMET    Component Value Date/Time   NA 134 (L) 05/07/2021 0152   NA 137 01/28/2021 1142   K 4.3 05/07/2021 0152   CL 101 05/07/2021 0152   CO2 27 05/07/2021 0152   GLUCOSE 154 (H) 05/07/2021 0152   BUN 11 05/07/2021 0152   BUN 30 (H) 01/28/2021 1142   CREATININE 1.35 (H) 05/07/2021 0152   CALCIUM 8.6 (L) 05/07/2021 0152   GFRNONAA 55 (L) 05/07/2021 0152   GFRAA 44 (L) 10/17/2020 1434    INR    Component Value Date/Time   INR 1.4 (H) 04/24/2021 1041     Intake/Output Summary (Last 24 hours) at 05/09/2021 0701 Last data filed at 05/09/2021 0600 Gross per 24 hour  Intake 240 ml  Output 2050 ml  Net -1810 ml     Assessment/Plan:  75 y.o. 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  He will need wet to dry dressing changes bid at discharge.   -f/u with [facility] in 2 weeks.  Our office will arrange appt.  -ok for discharge from vascular standpoint. -DVT prophylaxis:  sq heparin    Doreatha Massed, PA-C [facility] [phone] 05/09/2021 7:01 AM  I have seen and evaluated the patient. I agree with the PA note as documented above.  Status post complex revascularization of left leg including left common iliac stent with femoral endarterectomy and left femoral to TP trunk bypass.  Very brisk PT signal at the ankle.  Incisions are healing appropriately.  Wound looks good with wet-to-dry dressing for toe amputation.  He needs Plavix for iliac stent and can restart DOAC for A. fib  [name], MD [facility] [place] Office: [phone]

## 2021-05-09 NOTE — TOC Transition Note (Addendum)
Transition of Care [facility]) - CM/SW Discharge Note   Patient Details  Name: Todd Zhang MRN: [id number] Date of Birth: [birth year]  Transition of Care [facility]) CM/SW Contact:  Patrice Paradise, LCSW Phone Number:[phone] 05/09/2021, 11:19 AM   Clinical Narrative:     Patient will DC to:?[facility] Anticipated DC date: 05/09/2021? Family notified:?[facility] Transport by: Sharin Mons   Per MD patient ready for DC to [facility]. RN, patient, patient's family, and facility notified of DC. Discharge Summary faxed to facility as requested. RN given number for report  [phone] room 137 DC packet on chart. Ambulance transport requested for patient.   CSW signing off.   Judd Lien, [place] [phone]   Final next level of care: Skilled Nursing Facility Barriers to Discharge: Barriers Resolved   Patient Goals and CMS Choice        Discharge Placement              Patient chooses bed at:  [facility]) Patient to be transferred to facility by: [facility] Name of family member notified: Riley Lam Patient and family notified of of transfer: 05/09/21  Discharge Plan and Services In-house Referral: Clinical Social Work                                   Social Determinants of Health (SDOH) Interventions     Readmission Risk Interventions No flowsheet data found.

## 2021-05-09 NOTE — Progress Notes (Signed)
Called report to [place], Charity fundraiser at [employer].   Lawson Radar, RN

## 2021-05-09 NOTE — Discharge Summary (Signed)
Physician Discharge Summary  Todd Zhang ZOX:[id number] DOB: April 29, 1946 DOA: 04/24/2021  PCP: [name], FNP  Admit date: 04/24/2021 Discharge date: 05/09/2021  Admitted From: Home Disposition:  SNF  Recommendations for Outpatient Follow-up:  Follow up with PCP in 1-2 weeks Please obtain BMP/CBC in one week Check his blood pressure if tolerated start ACE inhibitor.   Home Health:No Equipment/Devices:none  Discharge Condition:Stable CODE STATUS:Full Diet recommendation: Heart Healthy  Brief/Interim Summary: 75 y.o. male past medical history of uncontrolled diabetes mellitus, essential hypertension hyperlipidemia GERD with a recent visit to [facility] IR 04/21/2021 for encephalopathy who presents to the hospital and admitted for severe sepsis secondary to acute cellulitis and osteomyelitis of the left great toe with abscess blood culture grew staph group B started empirically on antibiotics, 2D echo and TEE was done that showed no vegetation.  ID has been consulted recommended to consult vascular surgery angiogram was done that showed complex peripheral vascular disease, underwent left lower extremity angioplasty and stenting and bypass graft currently on Plavix.  Discharge Diagnoses:  Active Problems:   Diabetic foot infection (HCC)   Sepsis due to undetermined organism [facility])   Atrial fibrillation with RVR (HCC)   CKD (chronic kidney disease) stage 3, GFR 30-59 ml/min (HCC)   Cellulitis and abscess of foot   Acute osteomyelitis of toe of left foot (HCC)   Sepsis due to group B Streptococcus (HCC)   Cellulitis of great toe, left  Sepsis secondary to diabetic foot cellulitis abscess, osteomyelitis and group B strep bacteremia: He is status post aortogram with stenting and bypass for revascularization left, femoral endarterectomy.  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  Vascular recommended to continue Plavix. 2D echo was done that showed no  vegetation. ID is on board.  He has been transition to linezolid which she will continue for 10 days. Physical therapy evaluated the patient and recommended skilled nursing facility.   A. fib with RVR: Currently controlled on metoprolol and Cardizem. No changes made to his medication, continue metoprolol Cardizem and Xarelto.   Diabetes mellitus type 2 with hyperglycemia: With an A1c of 8.3, back to his home regimen no changes made.   COPD/chronic respiratory failure with hypoxia: Saturations have remained stable continue 2 L of oxygen to keep saturations greater than 90%.  Hypovolemic hyponatremia: Resolved with IV fluid hydration.   Chronic kidney disease stage IIIb: Creatinine appears to be at baseline.  Essential hypertension: Continue to hold ARB and ACE inhibitor's.  Coumadin check a basic metabolic panel.  Pressure is well controlled on diltiazem and metoprolol. I do not know at this point in time if his blood pressure will tolerate an ACE inhibitor   Deconditioning Physical therapy evaluated the patient the recommended skilled nursing facility.      Discharge Instructions  Discharge Instructions     Diet - low sodium heart healthy   Complete by: As directed    Discharge instructions   Complete by: As directed    Wet to dry saline dressing changes to left foot twice daily   Increase activity slowly   Complete by: As directed    No wound care   Complete by: As directed       Allergies as of 05/09/2021       Reactions   Asa [aspirin] Hives   Penicillins Hives   Hives + vomiting as an adult  - tolerated Ceftriaxone 04/2021        Medication List  TAKE these medications    Accu-Chek Aviva Plus test strip Generic drug: glucose blood CHECK BLOOD SUGAR FOUR TIMES DAILY Dx E11.40 What changed: additional instructions   alendronate 70 MG tablet Commonly known as: FOSAMAX TAKE 1 TABLET BY MOUTH ONCE A WEEK. TAKE WITH A FULL GLASS OF WATER ON AN EMPTY  STOMACH   atorvastatin 40 MG tablet Commonly known as: Lipitor Take 1 tablet (40 mg total) by mouth daily.   benazepril 5 MG tablet Commonly known as: LOTENSIN TAKE 1 TABLET BY MOUTH EVERY DAY   cetirizine 10 MG tablet Commonly known as: ZYRTEC Take 10 mg by mouth daily.   clopidogrel 75 MG tablet Commonly known as: PLAVIX Take 1 tablet (75 mg total) by mouth daily. Start taking on: May 10, 2021   diltiazem 240 MG 24 hr capsule Commonly known as: CARDIZEM CD Take 1 capsule (240 mg total) by mouth daily. Start taking on: May 10, 2021   fenofibrate 145 MG tablet Commonly known as: TRICOR Take 1 tablet (145 mg total) by mouth daily.   gabapentin 300 MG capsule Commonly known as: NEURONTIN Take 1 capsule (300 mg total) by mouth 4 (four) times daily. TAKE 1 CAPSULE BY MOUTH THREE TIMES DAILY FOR PAIN   HYDROcodone-acetaminophen 5-325 MG tablet Commonly known as: NORCO/VICODIN Take 1 tablet by mouth every 4 (four) hours as needed.   insulin regular 100 units/mL injection Commonly known as: HumuLIN R INJECT 10 UNITS INTO THE SKIN THREE TIMES DAILY BEFORE MEALS   Insulin Syringe-Needle U-100 31G X 5/16" 0.3 ML Misc Commonly known as: Global Inject Ease Insulin Syr USE WITH INSULIN EVERY DAY Dx E11.40   linezolid 600 MG tablet Commonly known as: ZYVOX Take 1 tablet (600 mg total) by mouth every 12 (twelve) hours for 9 days.   metFORMIN 500 MG tablet Commonly known as: GLUCOPHAGE Take 1 tablet (500 mg total) by mouth 2 (two) times daily.   metoprolol succinate 50 MG 24 hr tablet Commonly known as: TOPROL-XL TAKE 1 TABLET BY MOUTH DAILY, TAKE WITH FOOD OR IMMEDIATELY FOLLOWING A MEAL   multivitamin tablet Take 1 tablet by mouth daily.   mupirocin ointment 2 % Commonly known as: BACTROBAN Apply 1 application topically daily. Left Great Toe   omeprazole 20 MG capsule Commonly known as: PRILOSEC Take 1 capsule (20 mg total) by mouth daily.   tamsulosin 0.4  MG Caps capsule Commonly known as: FLOMAX Take 1 capsule (0.4 mg total) by mouth daily.   triamcinolone cream 0.5 % Commonly known as: KENALOG APPLY TO THE AFFECTED AREA(S) THREE TIMES DAILY   vitamin B-12 500 MCG tablet Commonly known as: CYANOCOBALAMIN Take 500 mcg by mouth daily.   Xarelto 20 MG Tabs tablet Generic drug: rivaroxaban Take 1 tablet (20 mg total) by mouth daily.        Follow-up Information     [facility] Follow up in 2 week(s).   Specialty: Vascular Surgery Why: Office will call you to arrange your appt (sent) Contact information: [address] [place] [address] [phone]               Allergies  Allergen Reactions   Asa [Aspirin] Hives   Penicillins Hives    Hives + vomiting as an adult  - tolerated Ceftriaxone 04/2021    Consultations: Vascular  ID  Procedures/Studies: MR FOOT LEFT WO CONTRAST  Result Date: 04/24/2021 CLINICAL DATA:  Foot swelling.  Left great toe redness and swelling. EXAM: MRI  OF THE LEFT FOOT WITHOUT CONTRAST TECHNIQUE: Multiplanar, multisequence MR imaging of the left foot was performed. No intravenous contrast was administered. COMPARISON:  None. FINDINGS: Bones/Joint/Cartilage Soft tissue wound along the medial aspect of the distal great toe. Bone marrow edema and bone destruction of the base of the first distal phalanx most concerning for osteomyelitis. Bone marrow edema in the first proximal phalanx which may be reactive versus secondary to osteomyelitis. Severe osteoarthritis of the first MTP joint with subchondral reactive marrow edema. Normal alignment. No joint effusion. Ligaments Collateral ligaments are intact.  Lisfranc ligament is intact. Muscles and Tendons Flexor, peroneal and extensor compartment tendons are intact. Generalized muscle atrophy. Soft tissue No fluid collection or hematoma. 6 mm T2 hyperintense soft tissue nodule along the plantar aspect of the  foot at the level of the metatarsal neck between the first and second metatarsals which may reflect a small neuroma or vascular malformation. IMPRESSION: 1. Soft tissue wound along the medial aspect of the distal great toe. Bone marrow edema and bone destruction of the base of the first distal phalanx most concerning for osteomyelitis. Bone marrow edema in the first proximal phalanx which may be reactive versus secondary to osteomyelitis. 2. Severe osteoarthritis of the first MTP joint with subchondral reactive marrow edema. Electronically Signed   By: Elige Ko M.D.   On: 04/24/2021 15:04   PERIPHERAL VASCULAR CATHETERIZATION  Result Date: 04/30/2021 DATE OF SERVICE: 04/30/2021  PATIENT:  Todd Zhang  75 y.o. 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 Lenell Antu, MD  ASSISTANT: none  ANESTHESIA:   local and IV sedation  ESTIMATED BLOOD LOSS: minimal  LOCAL MEDICATIONS USED:  LIDOCAINE  COUNTS: confirmed correct.  PATIENT DISPOSITION:  PACU - hemodynamically stable.  Delay start of Pharmacological VTE agent (>24hrs) due to surgical blood loss or risk of bleeding: no  INDICATION FOR PROCEDURE: Todd Zhang is a 75 y.o. male with ulceration and gangrene of the left great toe and non-invasive evidence of severe peripheral arterial disease. After careful discussion of risks, benefits, and alternatives the patient was offered angiogram. The patient understood and wished to proceed.  OPERATIVE FINDINGS:  Terminal aorta and iliac arteries: Heavily calcified / stenotic terminal aorta (>70% stenosis) / common iliac arteries (>75% stenosis) Internal iliac arteries patent bilaterally External iliac arteries  patient bilaterally  Right lower extremity: Common femoral artery: severe plaque. Sheath is occlusive of flow, making visualization of the remaining arteries limited Profunda femoris artery: Patent Superficial femoral artery: occluded Popliteal artery: reconstitutes below the knee Anterior tibial artery: patent at its origin, but occludes in the calf Tibioperoneal trunk: patent Peroneal artery: patent Posterior tibial artery: patent, dominant flow to the foot Pedal circulation: not evaluated  Left lower extremity: Common femoral artery: Significant calcified plaque Profunda femoris artery: widely patent Superficial femoral artery: Heavily calcified and stenotic. Short segment chronic total occlusion at Hunter's canal. Popliteal artery: heavily calcified and stenotic (greatest stenosis >95% behind the knee). Terminal popliteal artery / proximal TP trunk heavily calcified and stenosed (90%). Anterior tibial artery: occluded Tibioperoneal trunk: patent, but calcified Peroneal artery: patent to the ankle Posterior tibial artery: dominant. Flow to the foot. Fills the pedal arch Pedal circulation: intact  DESCRIPTION OF  PROCEDURE: After identification of the patient in the pre-operative holding area, the patient was transferred to the operating room. The patient was positioned supine on the operating room table. Anesthesia was induced. The groins was prepped and draped in standard fashion. A surgical pause was performed confirming correct patient, procedure, and operative location.  The right groin was anesthetized with subcutaneous injection of 1% lidocaine. Using ultrasound guidance, the right common femoral artery was accessed with micropuncture technique. Fluoroscopy was used to confirm cannulation over the femoral head. The 91F sheath was upsized to 64F.  A Benson wire was advanced into the distal aorta. Over the wire an omni flush catheter was advanced to the level of L2. Aortogram was performed - see above for  details. Bilateral lower extremity runoff angiography was performed - see above for details.  Conscious sedation was administered with the use of IV fentanyl and midazolam under continuous physician and nurse monitoring.  Heart rate, blood pressure, and oxygen saturation were continuously monitored.  Total sedation time was 36 minutes  Upon completion of the case instrument and sharps counts were confirmed correct. The patient was transferred to the PACU in good condition. I was present for all portions of the procedure.  PLAN: Complex disease pattern.  His terminal aorta and iliac arteries are heavily diseased.  He is not a physiologic candidate for open aortoiliac surgery.  The extent of disease made me leery about stenting.  I think the simplest option for limb salvage would be a common femoral endarterectomy and a femoral to TP trunk bypass.  Will discuss with Dr. Chestine Spore.  Continue best medical therapy for PAD.  Rande Brunt. Lenell Antu, MD [facility] Phone Number: [phone] 04/30/2021 8:21 AM   US ARTERIAL ABI (SCREENING LOWER EXTREMITY)  Result Date: 04/27/2021 CLINICAL DATA:  Left great toe pain and redness for 2 years. Recent development of left foot osteomyelitis. EXAM: NONINVASIVE PHYSIOLOGIC VASCULAR STUDY OF BILATERAL LOWER EXTREMITIES TECHNIQUE: Evaluation of both lower extremities were performed at rest, including calculation of ankle-brachial indices with single level Doppler, pressure and pulse volume recording. COMPARISON:  None. FINDINGS: Right ABI:  0.99 Left ABI:  1.00 Right Lower Extremity: Monophasic waveforms seen at the posterior tibial and dorsalis pedis arteries. Left Lower Extremity: Monophasic waveform seen in the dorsalis pedis and posterior tibial arteries. 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 The essentially normal ABI values are likely  artifact related to diffusely calcified noncompressible arteries. Further evaluation with CT angiography of the lower extremities would be beneficial to determine true degree of stenosis. Electronically Signed   By: Acquanetta Belling M.D.   On: 04/27/2021 11:13   DG CHEST PORT 1 VIEW  Result Date: 04/29/2021 CLINICAL DATA:  Shortness of breath EXAM: PORTABLE CHEST 1 VIEW COMPARISON:  Chest radiograph 1 day prior FINDINGS: The cardiomediastinal silhouette is stable. There is vascular congestion and mild pulmonary interstitial edema. A small left pleural effusion is new/increased in size from prior. Patchy opacities in the left base likely reflect subsegmental atelectasis. There is no right effusion. There is no pneumothorax. The bones are unremarkable. IMPRESSION: 1. New/increased small left pleural effusion with adjacent opacities likely reflecting subsegmental atelectasis. 2. Similar degree of pulmonary interstitial edema. Electronically Signed   By: Lesia Hausen M.D.   On: 04/29/2021 08:30   DG CHEST PORT 1 VIEW  Result Date: 04/28/2021 CLINICAL DATA:  75 year old male with shortness of breath EXAM: PORTABLE  CHEST 1 VIEW COMPARISON:  04/25/2021 FINDINGS: Cardiomediastinal silhouette unchanged in size and contour. No evidence of central vascular congestion. No interlobular septal thickening. Calcifications of the aortic arch again demonstrated. Interlobular septal thickening with a predominantly reticular pattern of opacity. Peribronchial thickening. Low lung volumes with patchy opacity in the right infrahilar region. No acute displaced fracture. Degenerative changes of the spine. IMPRESSION: Evidence of CHF with worsening pulmonary edema and right basilar atelectasis/consolidation. Electronically Signed   By: Gilmer Mor D.O.   On: 04/28/2021 10:33   DG CHEST PORT 1 VIEW  Result Date: 04/25/2021 CLINICAL DATA:  Sepsis, shortness of breath EXAM: PORTABLE CHEST 1 VIEW COMPARISON:  04/24/2021 FINDINGS:  Stable cardiomediastinal contours. Atherosclerotic calcification of the aortic knob. Slightly increasing interstitial markings bilaterally. No lobar consolidation. No pleural effusion or pneumothorax. IMPRESSION: Slightly increasing interstitial markings bilaterally may reflect mild edema or atypical/viral infection. Electronically Signed   By: Duanne Guess D.O.   On: 04/25/2021 17:21   DG Chest Port 1 View  Result Date: 04/24/2021 CLINICAL DATA:  Left flank pain. Possible sepsis. History of diabetes, hypertension and atrial fibrillation. EXAM: PORTABLE CHEST 1 VIEW COMPARISON:  Radiographs 04/21/2021 and 07/03/2020. FINDINGS: 1137 hours. The heart size and mediastinal contours are stable with aortic atherosclerosis. The lungs appear clear. There is no pleural effusion or pneumothorax. No acute osseous findings are evident. There are degenerative changes in the spine associated with a mild scoliosis. Telemetry leads overlie the chest. IMPRESSION: Stable chest.  No acute cardiopulmonary process. Electronically Signed   By: Carey Bullocks M.D.   On: 04/24/2021 12:20   DG Foot Complete Left  Result Date: 04/24/2021 CLINICAL DATA:  Left flank pain. Left great toe erythema. Question osteomyelitis. EXAM: LEFT FOOT - COMPLETE 3+ VIEW COMPARISON:  Reports from left foot radiographs 03/20/2019 and 03/21/2019. 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 The distal 2nd phalanx appears eroded without acute cortical destruction. There is soft tissue swelling and soft tissue emphysema of the great toe. There is possible cortical destruction involving the medial base of the distal 1st phalanx. Moderate degenerative changes are present at the 1st metatarsophalangeal joint. IMPRESSION: 1. Prior studies are unavailable for direct comparison. 2. Soft tissue swelling and  emphysema within the great toe consistent with soft tissue infection. Mild irregularity of the medial base of the distal phalanx could reflect early osteomyelitis. Consider further evaluation with MRI. 3. 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 Electronically Signed   By: Carey Bullocks M.D.   On: 04/24/2021 12:24   VAS [place] LOWER EXTREMITY SAPHENOUS VEIN MAPPING  Result Date: 05/02/2021 LOWER EXTREMITY VEIN MAPPING Patient Name:  DAMEON SOLTIS  Date of Exam:   05/02/2021 Medical Rec #: [id number]      Accession #:    [id number] Date of Birth: 11/02/1945     Patient Gender: M Patient Age:   13 years Exam Location:  [facility] Procedure:      VAS [place] LOWER EXTREMITY SAPHENOUS VEIN MAPPING Referring Phys: TIFFANY [place] --------------------------------------------------------------------------------  Indications:  Pre-op Risk Factors: PAD.  Comparison Study: No prior study Performing Technologist: Sherren Kerns RVS  Examination Guidelines: A complete evaluation includes B-mode imaging, spectral Doppler, color Doppler, and power Doppler as needed of all accessible portions of each vessel. Bilateral testing is considered  an integral part of a complete examination. Limited examinations for reoccurring indications may be performed as noted. +---------------+-----------+----------------------+---------------+-----------+   RT Diameter  RT Findings         GSV            LT Diameter  LT Findings      (cm)                                            (cm)                  +---------------+-----------+----------------------+---------------+-----------+      0.66                     Saphenofemoral         0.50                                                   Junction                                  +---------------+-----------+----------------------+---------------+-----------+      0.39        branching     Proximal thigh         0.47                  +---------------+-----------+----------------------+---------------+-----------+      0.32                       Mid thigh            0.48                  +---------------+-----------+----------------------+---------------+-----------+      0.24                      Distal thigh          0.46                  +---------------+-----------+----------------------+---------------+-----------+      0.22                          Knee              0.35                  +---------------+-----------+----------------------+---------------+-----------+      0.18                       Prox calf            0.40       branching  +---------------+-----------+----------------------+---------------+-----------+      0.16                        Mid calf            0.44                  +---------------+-----------+----------------------+---------------+-----------+      0.28       branching      Distal calf  0.37       branching  +---------------+-----------+----------------------+---------------+-----------+      0.21       branching         Ankle              0.35                  +---------------+-----------+----------------------+---------------+-----------+ Diagnosing physician: Waverly Ferrarihristopher Dickson MD Electronically signed by Waverly Ferrarihristopher Dickson MD on 05/02/2021 at 10:33:36 AM.    Final    ECHOCARDIOGRAM COMPLETE  Result Date: 04/26/2021    ECHOCARDIOGRAM REPORT   Patient Name:   Todd Zhang Date of Exam: 04/26/2021 Medical Rec #:  [id number]     Height:       72.0 in Accession #:    [id number]    Weight:       226.2 lb Date of Birth:  05-31-1946    BSA:          2.245 m Patient Age:    74 years      BP:           112/47 mmHg Patient Gender: M             HR:           114 bpm. [facility] Procedure: 2D Echo, Cardiac Doppler and Color Doppler Indications:    Bacteremia, Atrial fibrillation   History:        Patient has no prior history of Echocardiogram examinations.                 COPD, Arrythmias:Atrial Fibrillation, Signs/Symptoms:Bacteremia                 and Sepsis, confusion; Risk Factors:Hypertension, Diabetes and                 Dyslipidemia.  Sonographer:    Lavenia AtlasBrooke Strickland RDCS Referring Phys: [phone] DAVID TAT  Sonographer Comments: Patient is morbidly obese and Technically difficult study due to poor echo windows. IMPRESSIONS  1. Left ventricular ejection fraction, by estimation, is 60 to 65%. The left ventricle has normal function. The left ventricle has no regional wall motion abnormalities. Left ventricular diastolic function could not be evaluated.  2. Right ventricular systolic function is normal. The right ventricular size is normal.  3. The mitral valve is normal in structure. No evidence of mitral valve regurgitation. No evidence of mitral stenosis.  4. The aortic valve has an indeterminant number of cusps. Aortic valve regurgitation is not visualized. Mild aortic valve stenosis.  5. The inferior vena cava is dilated in size with >50% respiratory variability, suggesting right atrial pressure of 8 mmHg. FINDINGS  Left Ventricle: Left ventricular ejection fraction, by estimation, is 60 to 65%. The left ventricle has normal function. The left ventricle has no regional wall motion abnormalities. The left ventricular internal cavity size was normal in size. There is  no left ventricular hypertrophy. Left ventricular diastolic function could not be evaluated due to atrial fibrillation. Left ventricular diastolic function could not be evaluated. Right Ventricle: The right ventricular size is normal. Left Atrium: Left atrial size was normal in size. Right Atrium: Right atrial size was normal in size. Pericardium: There is no evidence of pericardial effusion. Mitral Valve: The mitral valve is normal in structure. Mild mitral annular calcification. No evidence of mitral valve regurgitation.  No evidence of mitral valve stenosis. Tricuspid Valve: The tricuspid valve is normal in structure. Tricuspid valve regurgitation is trivial. No  evidence of tricuspid stenosis. Aortic Valve: The aortic valve has an indeterminant number of cusps. Aortic valve regurgitation is not visualized. Mild aortic stenosis is present. Aortic valve mean gradient measures 11.0 mmHg. Aortic valve peak gradient measures 15.4 mmHg. Aortic valve  area, by VTI measures 1.86 cm. Pulmonic Valve: The pulmonic valve was normal in structure. Pulmonic valve regurgitation is not visualized. No evidence of pulmonic stenosis. Aorta: The aortic root is normal in size and structure. Venous: The inferior vena cava is dilated in size with greater than 50% respiratory variability, suggesting right atrial pressure of 8 mmHg. IAS/Shunts: No atrial level shunt detected by color flow Doppler.  LEFT VENTRICLE PLAX 2D LVIDd:         4.40 cm  Diastology LVIDs:         3.00 cm  LV e' medial:    10.80 cm/s LV PW:         1.18 cm  LV E/e' medial:  13.4 LV IVS:        1.18 cm  LV e' lateral:   11.60 cm/s LVOT diam:     2.20 cm  LV E/e' lateral: 12.5 LV SV:         76 LV SV Index:   34 LVOT Area:     3.80 cm  RIGHT VENTRICLE RV Basal diam:  3.61 cm RV S prime:     11.00 cm/s TAPSE (M-mode): 1.1 cm LEFT ATRIUM           Index       RIGHT ATRIUM           Index LA diam:      3.90 cm 1.74 cm/m  RA Area:     15.10 cm LA Vol (A4C): 55.8 ml 24.86 ml/m RA Volume:   38.60 ml  17.19 ml/m  AORTIC VALVE AV Area (Vmax):    2.23 cm AV Area (Vmean):   2.07 cm AV Area (VTI):     1.86 cm AV Vmax:           196.00 cm/s AV Vmean:          153.500 cm/s AV VTI:            0.408 m AV Peak Grad:      15.4 mmHg AV Mean Grad:      11.0 mmHg LVOT Vmax:         115.00 cm/s LVOT Vmean:        83.700 cm/s LVOT VTI:          0.199 m LVOT/AV VTI ratio: 0.49  AORTA Ao Root diam: 3.00 cm MITRAL VALVE MV Area (PHT): 4.89 cm     SHUNTS MV Decel Time: 155 msec     Systemic VTI:  0.20 m  MV E velocity: 145.00 cm/s  Systemic Diam: 2.20 cm Olga Millers MD Electronically signed by Olga Millers MD Signature Date/Time: 04/26/2021/9:46:46 AM    Final    CT Renal Stone Study  Result Date: 04/24/2021 CLINICAL DATA:  Flank pain, kidney stones suspected; left flank pain EXAM: CT ABDOMEN AND PELVIS WITHOUT CONTRAST TECHNIQUE: Multidetector CT imaging of the abdomen and pelvis was performed following the standard protocol without IV contrast. COMPARISON:  None. FINDINGS: Lower chest: No acute abnormality. Hepatobiliary: No focal liver abnormality is seen. No gallstones, gallbladder wall thickening, or biliary dilatation. Pancreas: Unremarkable. Spleen: Unremarkable. Adrenals/Urinary Tract: Adrenals are unremarkable. No renal calculi or hydronephrosis. Ureters are normal in caliber. Bladder is unremarkable. Stomach/Bowel: Stomach is  within normal limits. Bowel is normal in caliber. Normal appendix. Vascular/Lymphatic: Extensive atherosclerosis. No enlarged lymph nodes. Reproductive: Unremarkable. Other: No free fluid. No significant abnormality of the abdominal wall. Musculoskeletal: Degenerative changes of the included spine. IMPRESSION: No acute abnormality.  No urinary tract calculi or hydronephrosis. Extensive atherosclerosis. Electronically Signed   By: Guadlupe Spanish M.D.   On: 04/24/2021 11:32   ECHO TEE  Result Date: 05/01/2021    TRANSESOPHOGEAL ECHO REPORT   Patient Name:   Todd Zhang Date of Exam: 05/01/2021 Medical Rec #:  [id number]     Height:       72.0 in Accession #:    [id number]    Weight:       221.3 lb Date of Birth:  07/11/1946    BSA:          2.224 m Patient Age:    74 years      BP:           125/46 mmHg Patient Gender: M             HR:           96 bpm. Exam Location:  Inpatient Procedure: Transesophageal Echo, Cardiac Doppler and Color Doppler Indications:     Bacteremia  History:         Patient has prior history of Echocardiogram examinations, most                   recent 04/26/2021. COPD, Arrythmias:Atrial Fibrillation; Risk                  Factors:Hypertension, Diabetes and Dyslipidemia.  Sonographer:     Ross Ludwig RDCS (AE) Referring Phys:  [id number] HAO MENG Diagnosing Phys: Chilton Si MD PROCEDURE: After discussion of the risks and benefits of a TEE, an informed consent was obtained from the patient. The transesophogeal probe was passed without difficulty through the esophogus of the patient. Local oropharyngeal anesthetic was provided with Cetacaine. Sedation performed by different physician. The patient was monitored while under deep sedation. Anesthestetic sedation was provided intravenously by Anesthesiology: 182.  of Propofol,  of Lidocaine. Image quality was adequate. The patient's vital signs; including heart rate, blood pressure, and oxygen saturation; remained stable throughout the procedure. The patient developed no complications during the procedure. IMPRESSIONS  1. Left ventricular ejection fraction, by estimation, is 60 to 65%. The left ventricle has normal function. The left ventricle has no regional wall motion abnormalities.  2. Right ventricular systolic function is normal. The right ventricular size is normal.  3. No left atrial/left atrial appendage thrombus was detected.  4. The mitral valve is normal in structure. Mild mitral valve regurgitation. No evidence of mitral stenosis.  5. 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 Functionally bicuspid. The aortic valve is calcified. There is moderate calcification of the aortic valve. There is moderate thickening of the aortic valve. Aortic valve regurgitation is not visualized. Mild to moderate aortic valve sclerosis/calcification is present, without any evidence of aortic stenosis. Aortic valve mean gradient measures 8.0 mmHg. Aortic valve Vmax measures 1.96 m/s.  6. 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  7. The inferior vena cava is normal in  size with greater than 50% respiratory variability, suggesting right atrial pressure of 3 mmHg. Conclusion(s)/Recommendation(s): No evidence of vegetation/infective endocarditis on this transesophageal echocardiogram. FINDINGS  Left Ventricle: Left ventricular ejection fraction, by estimation, is 60 to 65%. The left ventricle has normal function. The  left ventricle has no regional wall motion abnormalities. The left ventricular internal cavity size was normal in size. There is  no left ventricular hypertrophy. Right Ventricle: The right ventricular size is normal. No increase in right ventricular wall thickness. Right ventricular systolic function is normal. Left Atrium: Left atrial size was normal in size. No left atrial/left atrial appendage thrombus was detected. Right Atrium: Right atrial size was normal in size. Pericardium: There is no evidence of pericardial effusion. Mitral Valve: The mitral valve is normal in structure. Mild mitral valve regurgitation. No evidence of mitral valve stenosis. Tricuspid Valve: The tricuspid valve is normal in structure. Tricuspid valve regurgitation is not demonstrated. No evidence of tricuspid stenosis. 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 Functionally bicuspid. The aortic valve is calcified. There is moderate calcification of the aortic valve. There is moderate thickening of the aortic valve. Aortic valve regurgitation is not visualized. Mild to moderate aortic valve sclerosis/calcification is present, without any evidence of aortic stenosis. Aortic valve mean gradient measures 8.0 mmHg. Aortic valve peak gradient measures 15.4 mmHg. Pulmonic Valve: The pulmonic valve was normal in structure. Pulmonic valve regurgitation is not visualized. No evidence of pulmonic stenosis. Aorta: The aortic root is normal in size and structure. There is moderate (Grade III) layered plaque involving the descending aorta. Venous: The inferior vena cava is normal in  size with greater than 50% respiratory variability, suggesting right atrial pressure of 3 mmHg. IAS/Shunts: No atrial level shunt detected by color flow Doppler.  AORTIC VALVE AV Vmax:      196.00 cm/s AV Vmean:     133.000 cm/s AV VTI:       0.351 m AV Peak Grad: 15.4 mmHg AV Mean Grad: 8.0 mmHg Chilton Si MD Electronically signed by Chilton Si MD Signature Date/Time: 05/01/2021/12:51:27 PM    Final    HYBRID OR IMAGING ([facility] ONLY)  Result Date: 05/04/2021 There is no interpretation for this exam.  This order is for images obtained during a surgical procedure.  Please See "Surgeries" Tab for more information regarding the procedure.      Subjective: No complaints  Discharge Exam: Vitals:   05/09/21 0407 05/09/21 0925  BP: 111/68 121/69  Pulse: 69 89  Resp: 19 20  Temp: 98.8 F (37.1 C) 97.7 F (36.5 C)  SpO2: 90% 96%   Vitals:   05/08/21 2028 05/08/21 2345 05/09/21 0407 05/09/21 0925  BP: (!) 123/49 (!) 147/64 111/68 121/69  Pulse: 90 96 69 89  Resp: Temp: 98.7 F (37.1 C) 98.4 F (36.9 C) 98.8 F (37.1 C) 97.7 F (36.5 C)  TempSrc: Oral Oral Oral Oral  SpO2: 98% 94% 90% 96%  Weight:      Height:        General: Pt is alert, awake, not in acute distress Cardiovascular: RRR, S1/S2 +, no rubs, no gallops Respiratory: CTA bilaterally, no wheezing, no rhonchi Abdominal: Soft, NT, ND, bowel sounds + Extremities: no edema, no cyanosis    The results of significant diagnostics from this hospitalization (including imaging, microbiology, ancillary and laboratory) are listed below for reference.     Microbiology: Recent Results (from the past 240 hour(s))  Aerobic/Anaerobic Culture w Gram Stain (surgical/deep wound)     Status: None (Preliminary result)   Collection Time: 05/04/21  5:57 PM   Specimen: Foot, Left; Abscess  Result Value Ref Range Status   Specimen Description ABSCESS LEFT FOOT  Final   Special Requests NONE  Final   Gram Stain    Final    RARE WBC PRESENT, PREDOMINANTLY MONONUCLEAR NO ORGANISMS SEEN Performed at [facility], [address] [place], [place] [address]    Culture   Final    FEW ENTEROCOCCUS FAECALIS VANCOMYCIN RESISTANT ENTEROCOCCUS NO ANAEROBES ISOLATED; CULTURE IN PROGRESS FOR 5 DAYS    Report Status PENDING  Incomplete   Organism ID, Bacteria ENTEROCOCCUS FAECALIS  Final      Susceptibility   Enterococcus faecalis - MIC*    AMPICILLIN <=2 SENSITIVE Sensitive     VANCOMYCIN >=32 RESISTANT Resistant     GENTAMICIN SYNERGY SENSITIVE Sensitive     LINEZOLID 2 SENSITIVE Sensitive     * FEW ENTEROCOCCUS FAECALIS  Aerobic/Anaerobic Culture w Gram Stain (surgical/deep wound)     Status: None (Preliminary result)   Collection Time: 05/04/21  6:03 PM   Specimen: Bone; Tissue  Result Value Ref Range Status   Specimen Description BONE  Final   Special Requests LEFT RAY TOE  Final   Gram Stain   Final    RARE WBC PRESENT,BOTH PMN AND MONONUCLEAR NO ORGANISMS SEEN Performed at [facility], [address], [place], [place] [address]    Culture   Final    RARE ENTEROCOCCUS FAECALIS VANCOMYCIN RESISTANT ENTEROCOCCUS CRITICAL RESULT CALLED TO, READ BACK BY AND VERIFIED WITH: RN T.HARRIS AT 1422 ON 05/06/2021 BY T.SAAD. NO ANAEROBES ISOLATED; CULTURE IN PROGRESS FOR 5 DAYS    Report Status PENDING  Incomplete   Organism ID, Bacteria ENTEROCOCCUS FAECALIS  Final      Susceptibility   Enterococcus faecalis - MIC*    AMPICILLIN <=2 SENSITIVE Sensitive     VANCOMYCIN >=32 RESISTANT Resistant     GENTAMICIN SYNERGY SENSITIVE Sensitive     LINEZOLID 2 SENSITIVE Sensitive     * RARE ENTEROCOCCUS FAECALIS  SARS CORONAVIRUS 2 (TAT 6-24 HRS) Nasopharyngeal Nasopharyngeal Swab     Status: None   Collection Time: 05/08/21  3:11 PM   Specimen: Nasopharyngeal Swab  Result Value Ref Range Status   SARS Coronavirus 2 NEGATIVE NEGATIVE Final    Comment: (NOTE) SARS-CoV-2 target nucleic acids are  NOT DETECTED.  The SARS-CoV-2 RNA is generally detectable in upper and lower respiratory specimens during the acute phase of infection. Negative results do not preclude SARS-CoV-2 infection, do not rule out co-infections with other pathogens, and should not be used as the sole basis for treatment or other patient management decisions. Negative results must be combined with clinical observations, patient history, and epidemiological information. The expected result is Negative.  Fact Sheet for Patients: HairSlick.no  Fact Sheet for Healthcare Providers: quierodirigir.com  This test is not yet approved or cleared by the [place] FDA and  has been authorized for detection and/or diagnosis of SARS-CoV-2 by FDA under an Emergency Use Authorization (EUA). This EUA will remain  in effect (meaning this test can be used) for the duration of the COVID-19 declaration under Se ction 564(b)(1) of the Act, 21 U.S.C. section 360bbb-3(b)(1), unless the authorization is terminated or revoked sooner.  Performed at [facility], [address], [place], [place] [address]      Labs: BNP (last 3 results) Recent Labs    04/26/21 0701 04/29/21 0630  BNP 252.0* 526.3*   Basic Metabolic Panel: Recent Labs  Lab 05/04/21 1629 05/04/21 1930 05/04/21 2353 05/05/21 1231 05/07/21 0152 05/09/21 0941  NA 140  --  138 133* 134* 137  K 4.2  --  4.1 4.1 4.3 4.5  CL  --   --  103 100 101 104  CO2  --   --  27 25 27 25   GLUCOSE  --   --  152* 200* 154* 189*  BUN  --   --  9 11 11 11   CREATININE  --  1.07 1.13 1.20 1.35* 1.36*  CALCIUM  --   --  8.4* 8.5* 8.6* 9.0  MG  --   --   --  1.4* 2.0  --    Liver Function Tests: No results for input(s): AST, ALT, ALKPHOS, BILITOT, PROT, ALBUMIN in the last 168 hours. No results for input(s): LIPASE, AMYLASE in the last 168 hours. No results for input(s): AMMONIA in the last 168  hours. CBC: Recent Labs  Lab 05/04/21 1629 05/04/21 1930 05/04/21 2353 05/05/21 1231  WBC  --  10.6* 10.6* 10.0  NEUTROABS  --   --   --  8.0*  HGB 9.2* 9.3* 9.7* 9.5*  HCT 27.0* 28.1* 28.9* 27.9*  MCV  --  91.5 90.9 89.7  PLT  --  332 324 342   Cardiac Enzymes: No results for input(s): CKTOTAL, CKMB, CKMBINDEX, TROPONINI in the last 168 hours. BNP: Invalid input(s): POCBNP CBG: Recent Labs  Lab 05/08/21 0555 05/08/21 1103 05/08/21 1703 05/08/21 2115 05/09/21 0558  GLUCAP 109* 127* 79 151* 101*   D-Dimer No results for input(s): DDIMER in the last 72 hours. Hgb A1c No results for input(s): HGBA1C in the last 72 hours. Lipid Profile No results for input(s): CHOL, HDL, LDLCALC, TRIG, CHOLHDL, LDLDIRECT in the last 72 hours. Thyroid function studies No results for input(s): TSH, T4TOTAL, T3FREE, THYROIDAB in the last 72 hours.  Invalid input(s): FREET3 Anemia work up No results for input(s): VITAMINB12, FOLATE, FERRITIN, TIBC, IRON, RETICCTPCT in the last 72 hours. Urinalysis    Component Value Date/Time   COLORURINE YELLOW 04/24/2021 1029   APPEARANCEUR HAZY (A) 04/24/2021 1029   LABSPEC 1.018 04/24/2021 1029   PHURINE 5.0 04/24/2021 1029   GLUCOSEU >=500 (A) 04/24/2021 1029   HGBUR MODERATE (A) 04/24/2021 1029   BILIRUBINUR NEGATIVE 04/24/2021 1029   BILIRUBINUR neg 03/04/2020 1437   KETONESUR NEGATIVE 04/24/2021 1029   PROTEINUR 30 (A) 04/24/2021 1029   UROBILINOGEN negative (A) 03/04/2020 1437   NITRITE NEGATIVE 04/24/2021 1029   LEUKOCYTESUR NEGATIVE 04/24/2021 1029   Sepsis Labs Invalid input(s): PROCALCITONIN,  WBC,  LACTICIDVEN Microbiology Recent Results (from the past 240 hour(s))  Aerobic/Anaerobic Culture w Gram Stain (surgical/deep wound)     Status: None (Preliminary result)   Collection Time: 05/04/21  5:57 PM   Specimen: Foot, Left; Abscess  Result Value Ref Range Status   Specimen Description ABSCESS LEFT FOOT  Final   Special Requests  NONE  Final   Gram Stain   Final    RARE WBC PRESENT, PREDOMINANTLY MONONUCLEAR NO ORGANISMS SEEN Performed at [facility], [address] [place], [address] [place]    Culture   Final    FEW ENTEROCOCCUS FAECALIS VANCOMYCIN RESISTANT ENTEROCOCCUS NO ANAEROBES ISOLATED; CULTURE IN PROGRESS FOR 5 DAYS    Report Status PENDING  Incomplete   Organism ID, Bacteria ENTEROCOCCUS FAECALIS  Final      Susceptibility   Enterococcus faecalis - MIC*    AMPICILLIN <=2 SENSITIVE Sensitive     VANCOMYCIN >=32 RESISTANT Resistant     GENTAMICIN SYNERGY SENSITIVE Sensitive     LINEZOLID 2 SENSITIVE Sensitive     * FEW  ENTEROCOCCUS FAECALIS  Aerobic/Anaerobic Culture w Gram Stain (surgical/deep wound)     Status: None (Preliminary result)   Collection Time: 05/04/21  6:03 PM   Specimen: Bone; Tissue  Result Value Ref Range Status   Specimen Description BONE  Final   Special Requests LEFT RAY TOE  Final   Gram Stain   Final    RARE WBC PRESENT,BOTH PMN AND MONONUCLEAR NO ORGANISMS SEEN Performed at [facility], [address] [place], [place] [address]    Culture   Final    RARE ENTEROCOCCUS FAECALIS VANCOMYCIN RESISTANT ENTEROCOCCUS CRITICAL RESULT CALLED TO, READ BACK BY AND VERIFIED WITH: RN T.HARRIS AT 1422 ON 05/06/2021 BY T.SAAD. NO ANAEROBES ISOLATED; CULTURE IN PROGRESS FOR 5 DAYS    Report Status PENDING  Incomplete   Organism ID, Bacteria ENTEROCOCCUS FAECALIS  Final      Susceptibility   Enterococcus faecalis - MIC*    AMPICILLIN <=2 SENSITIVE Sensitive     VANCOMYCIN >=32 RESISTANT Resistant     GENTAMICIN SYNERGY SENSITIVE Sensitive     LINEZOLID 2 SENSITIVE Sensitive     * RARE ENTEROCOCCUS FAECALIS  SARS CORONAVIRUS 2 (TAT 6-24 HRS) Nasopharyngeal Nasopharyngeal Swab     Status: None   Collection Time: 05/08/21  3:11 PM   Specimen: Nasopharyngeal Swab  Result Value Ref Range Status   SARS Coronavirus 2 NEGATIVE NEGATIVE Final    Comment:  (NOTE) SARS-CoV-2 target nucleic acids are NOT DETECTED.  The SARS-CoV-2 RNA is generally detectable in upper and lower respiratory specimens during the acute phase of infection. Negative results do not preclude SARS-CoV-2 infection, do not rule out co-infections with other pathogens, and should not be used as the sole basis for treatment or other patient management decisions. Negative results must be combined with clinical observations, patient history, and epidemiological information. The expected result is Negative.  Fact Sheet for Patients: HairSlick.no  Fact Sheet for Healthcare Providers: quierodirigir.com  This test is not yet approved or cleared by the [place] FDA and  has been authorized for detection and/or diagnosis of SARS-CoV-2 by FDA under an Emergency Use Authorization (EUA). This EUA will remain  in effect (meaning this test can be used) for the duration of the COVID-19 declaration under Se ction 564(b)(1) of the Act, 21 U.S.C. section 360bbb-3(b)(1), unless the authorization is terminated or revoked sooner.  Performed at [facility], [address] [place], [place] [address]      Time coordinating discharge: Over 30 minutes  SIGNED:   Marinda Elk, MD  [facility] 05/09/2021, 10:21 AM Pager   If 7PM-7AM, please contact night-coverage [email or web address] Password [email or web address]

## 2021-05-09 NOTE — Progress Notes (Signed)
[facility] PROGRESS NOTE    Progress Note  Todd Zhang  LOV:[id number] DOB: 1946-04-03 DOA: 04/24/2021 PCP: [name], FNP     Brief Narrative:   Todd Zhang is an 75 y.o. male past medical history of uncontrolled diabetes mellitus, essential hypertension hyperlipidemia GERD with a recent visit to [facility] IR 04/21/2021 for encephalopathy who presents to the hospital and admitted for severe sepsis secondary to acute cellulitis and osteomyelitis of the left great toe with abscess blood culture grew staph group B started empirically on antibiotics, 2D echo and TEE was done that showed no vegetation.  ID has been consulted recommended to consult vascular surgery angiogram was done that showed complex peripheral vascular disease, underwent left lower extremity angioplasty and stenting and bypass graft currently on Plavix.  Patient is stable to be transferred to skilled nursing facility, awaiting insurance authorization  Assessment/Plan:   Sepsis secondary to diabetic foot cellulitis abscess, osteomyelitis and group B strep bacteremia: He is status post aortogram with stenting and bypass for revascularization left, femoral endarterectomy.  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  Vascular recommended to continue Plavix. 2D echo was done that showed no vegetation. ID is on board.  He has been transition to linezolid which she will continue for 10 days, has remained afebrile. Physical therapy evaluated the patient and recommended skilled nursing facility.  A. fib with RVR: Currently controlled on metoprolol and Cardizem. Go ahead and start him on oral Xarelto vascular agrees.  Diabetes mellitus type 2 with hyperglycemia: With an A1c of 8.3, currently well controlled on sliding scale insulin plus long-acting insulin.  COPD/chronic respiratory failure with hypoxia: Saturations have remained stable continue 2 L of oxygen to keep saturations  greater than 90%.  Hypovolemic hyponatremia: Slight drop today continue to monitor intermittently.  Chronic kidney disease stage IIIb: Creatinine appears to be at baseline.  Essential hypertension: Continue to hold ARB and ACE inhibitor's.  Coumadin check a basic metabolic panel.  Pressure is well controlled on diltiazem and metoprolol. I do not know at this point in time if his blood pressure will tolerate an ACE inhibitor  Deconditioning Physical therapy evaluated the patient the recommended skilled nursing facility.    DVT prophylaxis: lovenox Family Communication:none Status is: Inpatient  Remains inpatient appropriate because:Hemodynamically unstable  Dispo: The patient is from: Home              Anticipated d/c is to: SNF              Patient currently is not medically stable to d/c.   Difficult to place patient No    Code Status:     Code Status Orders  (From admission, onward)           Start     Ordered   04/24/21 1924  Full code  Continuous        04/24/21 1923           Code Status History     This patient has a current code status but no historical code status.         IV Access:   Peripheral IV   Procedures and diagnostic studies:   No results found.   Medical Consultants:   None.   Subjective:    Todd Zhang no complaints  Objective:    Vitals:   05/08/21 1704 05/08/21 2028 05/08/21 2345 05/09/21 0407  BP: 91/71 (!) 123/49 (!) 147/64 111/68  Pulse:  73 90 96 69  Resp: 20 18 17 19   Temp: 98.1 F (36.7 C) 98.7 F (37.1 C) 98.4 F (36.9 C) 98.8 F (37.1 C)  TempSrc: Oral Oral Oral Oral  SpO2: 94% 98% 94% 90%  Weight:      Height:       SpO2: 90 % O2 Flow Rate (L/min): 2 L/min   Intake/Output Summary (Last 24 hours) at 05/09/2021 05/11/2021 Last data filed at 05/09/2021 0700 Gross per 24 hour  Intake 240 ml  Output 2300 ml  Net -2060 ml    Filed Weights   04/25/21 2002 04/30/21 1432 05/01/21 0834  Weight:  102.6 kg 100.4 kg 100.4 kg    Exam: General exam: In no acute distress. Respiratory system: Good air movement and clear to auscultation. Cardiovascular system: S1 & S2 heard, RRR. No JVD. Gastrointestinal system: Abdomen is nondistended, soft and nontender.  Extremities: No pedal edema. Skin: No rashes, lesions or ulcers  Data Reviewed:    Labs: Basic Metabolic Panel: Recent Labs  Lab 05/04/21 0900 05/04/21 1629 05/04/21 1629 05/04/21 1930 05/04/21 2353 05/05/21 1231 05/07/21 0152  NA  --  140  --   --  138 133* 134*  K  --  4.2   < >  --  4.1 4.1 4.3  CL  --   --   --   --  103 100 101  CO2  --   --   --   --  27 25 27   GLUCOSE  --   --   --   --  152* 200* 154*  BUN  --   --   --   --  9 11 11   CREATININE 1.23  --   --  1.07 1.13 1.20 1.35*  CALCIUM  --   --   --   --  8.4* 8.5* 8.6*  MG  --   --   --   --   --  1.4* 2.0   < > = values in this interval not displayed.    GFR Estimated Creatinine Clearance: 58 mL/min (A) (by C-G formula based on SCr of 1.35 mg/dL (H)). Liver Function Tests: No results for input(s): AST, ALT, ALKPHOS, BILITOT, PROT, ALBUMIN in the last 168 hours.  No results for input(s): LIPASE, AMYLASE in the last 168 hours. No results for input(s): AMMONIA in the last 168 hours. Coagulation profile No results for input(s): INR, PROTIME in the last 168 hours. COVID-19 Labs  No results for input(s): DDIMER, FERRITIN, LDH, CRP in the last 72 hours.  Lab Results  Component Value Date   SARSCOV2NAA NEGATIVE 05/08/2021   SARSCOV2NAA NEGATIVE 04/24/2021    CBC: Recent Labs  Lab 05/04/21 1629 05/04/21 1930 05/04/21 2353 05/05/21 1231  WBC  --  10.6* 10.6* 10.0  NEUTROABS  --   --   --  8.0*  HGB 9.2* 9.3* 9.7* 9.5*  HCT 27.0* 28.1* 28.9* 27.9*  MCV  --  91.5 90.9 89.7  PLT  --  332 324 342    Cardiac Enzymes: No results for input(s): CKTOTAL, CKMB, CKMBINDEX, TROPONINI in the last 168 hours. BNP (last 3 results) No results for  input(s): PROBNP in the last 8760 hours. CBG: Recent Labs  Lab 05/08/21 0555 05/08/21 1103 05/08/21 1703 05/08/21 2115 05/09/21 0558  GLUCAP 109* 127* 79 151* 101*    D-Dimer: No results for input(s): DDIMER in the last 72 hours. Hgb A1c: No results for input(s): HGBA1C in the last  72 hours. Lipid Profile: No results for input(s): CHOL, HDL, LDLCALC, TRIG, CHOLHDL, LDLDIRECT in the last 72 hours.  Thyroid function studies: No results for input(s): TSH, T4TOTAL, T3FREE, THYROIDAB in the last 72 hours.  Invalid input(s): FREET3 Anemia work up: No results for input(s): VITAMINB12, FOLATE, FERRITIN, TIBC, IRON, RETICCTPCT in the last 72 hours. Sepsis Labs: Recent Labs  Lab 05/04/21 1930 05/04/21 2353 05/05/21 1231  WBC 10.6* 10.6* 10.0    Microbiology Recent Results (from the past 240 hour(s))  Aerobic/Anaerobic Culture w Gram Stain (surgical/deep wound)     Status: None (Preliminary result)   Collection Time: 05/04/21  5:57 PM   Specimen: Foot, Left; Abscess  Result Value Ref Range Status   Specimen Description ABSCESS LEFT FOOT  Final   Special Requests NONE  Final   Gram Stain   Final    RARE WBC PRESENT, PREDOMINANTLY MONONUCLEAR NO ORGANISMS SEEN Performed at [facility], [address] [place], [place] [address]    Culture   Final    FEW ENTEROCOCCUS FAECALIS VANCOMYCIN RESISTANT ENTEROCOCCUS NO ANAEROBES ISOLATED; CULTURE IN PROGRESS FOR 5 DAYS    Report Status PENDING  Incomplete   Organism ID, Bacteria ENTEROCOCCUS FAECALIS  Final      Susceptibility   Enterococcus faecalis - MIC*    AMPICILLIN <=2 SENSITIVE Sensitive     VANCOMYCIN >=32 RESISTANT Resistant     GENTAMICIN SYNERGY SENSITIVE Sensitive     LINEZOLID 2 SENSITIVE Sensitive     * FEW ENTEROCOCCUS FAECALIS  Aerobic/Anaerobic Culture w Gram Stain (surgical/deep wound)     Status: None (Preliminary result)   Collection Time: 05/04/21  6:03 PM   Specimen: Bone; Tissue  Result Value  Ref Range Status   Specimen Description BONE  Final   Special Requests LEFT RAY TOE  Final   Gram Stain   Final    RARE WBC PRESENT,BOTH PMN AND MONONUCLEAR NO ORGANISMS SEEN Performed at [facility], [address] [place], [place] [address]    Culture   Final    RARE ENTEROCOCCUS FAECALIS VANCOMYCIN RESISTANT ENTEROCOCCUS CRITICAL RESULT CALLED TO, READ BACK BY AND VERIFIED WITH: RN T.HARRIS AT 1422 ON 05/06/2021 BY T.SAAD. NO ANAEROBES ISOLATED; CULTURE IN PROGRESS FOR 5 DAYS    Report Status PENDING  Incomplete   Organism ID, Bacteria ENTEROCOCCUS FAECALIS  Final      Susceptibility   Enterococcus faecalis - MIC*    AMPICILLIN <=2 SENSITIVE Sensitive     VANCOMYCIN >=32 RESISTANT Resistant     GENTAMICIN SYNERGY SENSITIVE Sensitive     LINEZOLID 2 SENSITIVE Sensitive     * RARE ENTEROCOCCUS FAECALIS  SARS CORONAVIRUS 2 (TAT 6-24 HRS) Nasopharyngeal Nasopharyngeal Swab     Status: None   Collection Time: 05/08/21  3:11 PM   Specimen: Nasopharyngeal Swab  Result Value Ref Range Status   SARS Coronavirus 2 NEGATIVE NEGATIVE Final    Comment: (NOTE) SARS-CoV-2 target nucleic acids are NOT DETECTED.  The SARS-CoV-2 RNA is generally detectable in upper and lower respiratory specimens during the acute phase of infection. Negative results do not preclude SARS-CoV-2 infection, do not rule out co-infections with other pathogens, and should not be used as the sole basis for treatment or other patient management decisions. Negative results must be combined with clinical observations, patient history, and epidemiological information. The expected result is Negative.  Fact Sheet for Patients: HairSlick.nohttps://www.fda.gov/media/138098/download  Fact Sheet for Healthcare Providers: quierodirigir.comhttps://www.fda.gov/media/138095/download  This test is not yet approved  or cleared by the [place] and  has been authorized for detection and/or diagnosis of SARS-CoV-2 by FDA under an  Emergency Use Authorization (EUA). This EUA will remain  in effect (meaning this test can be used) for the duration of the COVID-19 declaration under Se ction 564(b)(1) of the Act, 21 U.S.C. section 360bbb-3(b)(1), unless the authorization is terminated or revoked sooner.  Performed at [facility], [address] [place], [place] [address]      Medications:    atorvastatin  40 mg Oral Daily   clopidogrel  75 mg Oral Daily   diltiazem  240 mg Oral Daily   docusate sodium  100 mg Oral Daily   fenofibrate  160 mg Oral Daily   gabapentin  300 mg Oral TID   guaiFENesin  1,200 mg Oral BID   heparin  5,000 Units Subcutaneous Q8H   insulin aspart  0-15 Units Subcutaneous TID WC   insulin aspart  0-5 Units Subcutaneous QHS   insulin aspart  3 Units Subcutaneous TID WC   insulin detemir  7 Units Subcutaneous BID   linezolid  600 mg Oral Q12H   loratadine  10 mg Oral Daily   metoprolol succinate  100 mg Oral Daily   multivitamin with minerals  1 tablet Oral Daily   pantoprazole  40 mg Oral Daily   polyethylene glycol  17 g Oral BID   sodium chloride flush  3 mL Intravenous Q12H   tamsulosin  0.4 mg Oral Daily   vitamin B-12  500 mcg Oral Daily   Continuous Infusions:  sodium chloride        LOS: 15 days   Marinda Elk  [facility]  05/09/2021, 8:22 AM

## 2021-05-10 LAB — AEROBIC/ANAEROBIC CULTURE W GRAM STAIN (SURGICAL/DEEP WOUND)

## 2021-05-11 ENCOUNTER — Telehealth: Payer: Self-pay

## 2021-05-11 NOTE — Telephone Encounter (Signed)
Returned [employer] ([facility]) to try to help her find pt's weight bearing status in his record. Pt has not been seen in office, as he was consulted in ED. Augusto Gamble is aware of this and verbalized understanding. No further questions/concerns at this time.

## 2021-05-14 ENCOUNTER — Encounter (HOSPITAL_COMMUNITY): Payer: Self-pay | Admitting: Vascular Surgery

## 2021-05-26 ENCOUNTER — Encounter: Payer: Medicare Other | Admitting: Vascular Surgery

## 2021-06-01 ENCOUNTER — Ambulatory Visit: Payer: Medicare Other | Admitting: Nurse Practitioner

## 2021-06-01 ENCOUNTER — Ambulatory Visit (INDEPENDENT_AMBULATORY_CARE_PROVIDER_SITE_OTHER): Payer: Medicare Other | Admitting: Licensed Clinical Social Worker

## 2021-06-01 DIAGNOSIS — J441 Chronic obstructive pulmonary disease with (acute) exacerbation: Secondary | ICD-10-CM

## 2021-06-01 DIAGNOSIS — Z794 Long term (current) use of insulin: Secondary | ICD-10-CM

## 2021-06-01 DIAGNOSIS — I739 Peripheral vascular disease, unspecified: Secondary | ICD-10-CM

## 2021-06-01 DIAGNOSIS — F411 Generalized anxiety disorder: Secondary | ICD-10-CM

## 2021-06-01 DIAGNOSIS — I10 Essential (primary) hypertension: Secondary | ICD-10-CM

## 2021-06-01 DIAGNOSIS — E1140 Type 2 diabetes mellitus with diabetic neuropathy, unspecified: Secondary | ICD-10-CM

## 2021-06-01 DIAGNOSIS — I482 Chronic atrial fibrillation, unspecified: Secondary | ICD-10-CM

## 2021-06-01 NOTE — Chronic Care Management (AMB) (Signed)
Chronic Care Management    Clinical Social Work Note  06/01/2021 Name: Todd Zhang MRN: [id number] DOB: 10/21/1945  Todd Zhang is a 75 y.o. year old male who is a primary care patient of [name], FNP. The CCM team was consulted to assist the patient with chronic disease management and/or care coordination needs related to: [name] .   Engaged with patient /brother of client, Todd Zhang, by telephone for follow up visit in response to provider referral for social work chronic care management and care coordination services.   Consent to Services:  The patient was given information about Chronic Care Management services, agreed to services, and gave verbal consent prior to initiation of services.  Please see initial visit note for detailed documentation.   Patient agreed to services and consent obtained.   Assessment: Review of patient past medical history, allergies, medications, and health status, including review of relevant consultants reports was performed today as part of a comprehensive evaluation and provision of chronic care management and care coordination services.     SDOH (Social Determinants of Health) assessments and interventions performed:  SDOH Interventions    Flowsheet Row Most Recent Value  SDOH Interventions   Physical Activity Interventions Other (Comments)  [walking challenges,  mobility issues]  Social Connections Interventions Other (Comment)  [tends to socially isolate from others occasionally]  Depression Interventions/Treatment  --  [informed client previously of LCSW support and of RNCM support through CCM program]        Advanced Directives Status: See Vynca application for related entries.  CCM Care Plan  Allergies  Allergen Reactions   Asa [Aspirin] Hives   Penicillins Hives    Hives + vomiting as an adult  - tolerated Ceftriaxone 04/2021    Outpatient Encounter Medications as of 06/01/2021  Medication Sig Note    alendronate (FOSAMAX) 70 MG tablet TAKE 1 TABLET BY MOUTH ONCE A WEEK. TAKE WITH A FULL GLASS OF WATER ON AN EMPTY STOMACH    atorvastatin (LIPITOR) 40 MG tablet Take 1 tablet (40 mg total) by mouth daily.    benazepril (LOTENSIN) 5 MG tablet TAKE 1 TABLET BY MOUTH EVERY DAY    cetirizine (ZYRTEC) 10 MG tablet Take 10 mg by mouth daily.     clopidogrel (PLAVIX) 75 MG tablet Take 1 tablet (75 mg total) by mouth daily.    diltiazem (CARDIZEM CD) 240 MG 24 hr capsule Take 1 capsule (240 mg total) by mouth daily.    fenofibrate (TRICOR) 145 MG tablet Take 1 tablet (145 mg total) by mouth daily.    gabapentin (NEURONTIN) 300 MG capsule Take 1 capsule (300 mg total) by mouth 4 (four) times daily. TAKE 1 CAPSULE BY MOUTH THREE TIMES DAILY FOR PAIN    glucose blood (ACCU-CHEK AVIVA PLUS) test strip CHECK BLOOD SUGAR FOUR TIMES DAILY Dx E11.40    HYDROcodone-acetaminophen (NORCO/VICODIN) 5-325 MG tablet Take 1 tablet by mouth every 4 (four) hours as needed.    insulin regular (HUMULIN R) 100 units/mL injection INJECT 10 UNITS INTO THE SKIN THREE TIMES DAILY BEFORE MEALS    Insulin Syringe-Needle U-100 (GLOBAL INJECT EASE INSULIN SYR) 31G X 5/16" 0.3 ML MISC USE WITH INSULIN EVERY DAY Dx E11.40    metFORMIN (GLUCOPHAGE) 500 MG tablet Take 1 tablet (500 mg total) by mouth 2 (two) times daily.    metoprolol succinate (TOPROL-XL) 50 MG 24 hr tablet TAKE 1 TABLET BY MOUTH DAILY, TAKE WITH FOOD OR IMMEDIATELY FOLLOWING A MEAL 04/24/2021: Pt  is not sure what time he took this med   Multiple Vitamin (MULTIVITAMIN) tablet Take 1 tablet by mouth daily.    mupirocin ointment (BACTROBAN) 2 % Apply 1 application topically daily. Left Great Toe    omeprazole (PRILOSEC) 20 MG capsule Take 1 capsule (20 mg total) by mouth daily.    tamsulosin (FLOMAX) 0.4 MG CAPS capsule Take 1 capsule (0.4 mg total) by mouth daily.    triamcinolone cream (KENALOG) 0.5 % APPLY TO THE AFFECTED AREA(S) THREE TIMES DAILY    vitamin B-12  (CYANOCOBALAMIN) 500 MCG tablet Take 500 mcg by mouth daily.    XARELTO 20 MG TABS tablet Take 1 tablet (20 mg total) by mouth daily. 04/24/2021: Pt does not remember what time he took this med   No facility-administered encounter medications on file as of 06/01/2021.    Patient Active Problem List   Diagnosis Date Noted   Sepsis due to group B Streptococcus (HCC) 04/26/2021   Cellulitis of great toe, left    Acute osteomyelitis of toe of left foot (HCC) 04/25/2021   Cellulitis and abscess of foot    Diabetic foot infection (HCC) 04/24/2021   Sepsis due to undetermined organism (HCC) 04/24/2021   Atrial fibrillation with RVR (HCC) 04/24/2021   CKD (chronic kidney disease) stage 3, GFR 30-59 ml/Todd (HCC) 04/24/2021   Pure hypercholesterolemia 07/16/2020   Peripheral artery disease (HCC)    COPD exacerbation (HCC) 05/14/2019   DDD (degenerative disc disease), cervical 11/06/2018   Cervical spinal stenosis 11/06/2018   Generalized anxiety disorder 11/29/2017   Benign prostatic hyperplasia with incomplete bladder emptying 11/28/2017   Gastroesophageal reflux disease without esophagitis 09/07/2017   Essential hypertension 09/01/2017   Onychomycosis 09/01/2017   Type 2 diabetes mellitus with diabetic neuropathy, with long-term current use of insulin (HCC) 06/19/2014   Back pain 06/19/2014   Neck pain 06/19/2014    Conditions to be addressed/monitored: monitor housing needs of client, monitor in home care needs of client; monitor client mobility challenges  Care Plan : LCSW Care plan  Updates made by Todd Blakes, LCSW since 06/01/2021 12:00 AM     Problem: Coping Skills (General Plan of Care)      Goal: Coping Skills Enhanced: manage housing needs of client   Start Date: 04/22/2021  Expected End Date: 08/20/2021  This Visit's Progress: On track  Recent Progress: Not on track  Priority: High  Note:   Current barriers:   Patient in need of assistance with connecting to  community resources for possible help with housing needs of client Patient is unable to independently navigate community resource options without care coordination support Mobility issues and pain issues Decreased energy Breathing challenges  Clinical Goals:   LCSW to call client in next 30 days to discuss housing needs and housing situation of client Client to call RNCM or LCSW as needed in next 30 days for CCM program support LCSW to communicate with client in next 30 days to discuss in home care support for client  Clinical Interventions:  Collaboration with [name], FNP regarding development and update of comprehensive plan of care as evidenced by provider attestation and co-signature Talked with Todd Zhang, brother of client, about client needs. Todd Zhang reported that client had received care at [facility] for about one and one half weeks and then client discharged home.  Todd Zhang said client had been receiving in home care support, two visits per week.  Todd Zhang was unsure if care was for PT or for wound  care needs of client Todd Zhang reported that client had new contact phone number of [phone].   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 LCSW collaborated today with RNCM, Todd Zhang about client status and also gave Todd Zhang the new phone number for client. Client was scheduled to have face to face appointment today with [name], FNP at [facility]  Patient Coping Skills: Attends scheduled medical appointments No transport needs Completes ADLs daily  Patient deficits:   Mobility issues Housing needs  Patient Goals:   Patient will call RNCM or LCSW as needed in next 30 days for CCM support Patient will attend all scheduled client medical appointments in next 30 days Patient will communicate regularly with brother, Todd Zhang, in next 30 days to discuss  ongoing needs of client -  Follow Up Plan: LCSW to call client or his brother, Todd Zhang on 07/14/21 at 2:00 PM to assess client needs at that time      Todd Zhang.Loel Betancur MSW, LCSW Licensed Clinical Social Worker [facility] Care Management [phone]

## 2021-06-01 NOTE — Patient Instructions (Signed)
Visit Information  PATIENT GOALS:  Goals Addressed             This Visit's Progress    Protect My Health and manage housing needs of client; client to talk with [facility] nursing staff about breathing challenges of client       Timeframe:  Short-Term Goal Priority:  High Progress:  On Track Start Date:           04/22/21                Expected End Date:         08/20/21            Follow Up Date 07/14/21 at 2:00 PM   Protect My Health (Patient)  Manage housing needs of client ; client to talk with [facility] nursing staff about breathing challenges of client   Why is this important?   Screening tests can find diseases early when they are easier to treat.  Your doctor or nurse will talk with you about which tests are important for you.  Getting shots for common diseases like the flu and shingles will help prevent them.     Patient Coping Skills: Attends scheduled medical appointments No transport needs Completes ADLs daily  Patient deficits:   Mobility issues Housing needs  Patient Goals:   Patient will call RNCM or LCSW as needed in next 30 days for CCM support Patient will attend all scheduled client medical appointments in next 30 days Patient will communicate regularly with his brother, Riley Lam, in next 30 days to discuss ongoing needs of client -  Follow Up Plan: LCSW to call client or his brother, Riley Lam, on 07/14/21 at 2:00 PM to assess client needs at that time      Kelton Pillar.Lillion Elbert MSW, LCSW Licensed Clinical Social Worker [facility] Care Management [phone]

## 2021-06-02 ENCOUNTER — Encounter: Payer: Self-pay | Admitting: Nurse Practitioner

## 2021-06-02 DIAGNOSIS — Z89412 Acquired absence of left great toe: Secondary | ICD-10-CM | POA: Insufficient documentation

## 2021-06-02 DIAGNOSIS — D649 Anemia, unspecified: Secondary | ICD-10-CM | POA: Insufficient documentation

## 2021-06-02 DIAGNOSIS — Z89422 Acquired absence of other left toe(s): Secondary | ICD-10-CM | POA: Insufficient documentation

## 2021-06-02 DIAGNOSIS — Z8619 Personal history of other infectious and parasitic diseases: Secondary | ICD-10-CM | POA: Insufficient documentation

## 2021-06-02 DIAGNOSIS — Z89421 Acquired absence of other right toe(s): Secondary | ICD-10-CM | POA: Insufficient documentation

## 2021-06-12 DIAGNOSIS — E1140 Type 2 diabetes mellitus with diabetic neuropathy, unspecified: Secondary | ICD-10-CM | POA: Diagnosis not present

## 2021-06-12 DIAGNOSIS — I10 Essential (primary) hypertension: Secondary | ICD-10-CM | POA: Diagnosis not present

## 2021-06-12 DIAGNOSIS — I482 Chronic atrial fibrillation, unspecified: Secondary | ICD-10-CM

## 2021-06-12 DIAGNOSIS — J441 Chronic obstructive pulmonary disease with (acute) exacerbation: Secondary | ICD-10-CM

## 2021-06-12 DIAGNOSIS — Z794 Long term (current) use of insulin: Secondary | ICD-10-CM

## 2021-06-16 ENCOUNTER — Encounter: Payer: Medicare Other | Admitting: Vascular Surgery

## 2021-06-18 ENCOUNTER — Telehealth: Payer: Self-pay | Admitting: *Deleted

## 2021-06-18 NOTE — Telephone Encounter (Signed)
Call with [facility] about pt's medications that were [facility] & new ones from hospital discharge I was able to go over these from the list from care everywhere Pharmacy does not have several of these Pt was made HFU appt for 06/19/21 with Kari Baars, FNP

## 2021-06-19 ENCOUNTER — Encounter: Payer: Self-pay | Admitting: Family Medicine

## 2021-06-19 ENCOUNTER — Other Ambulatory Visit: Payer: Self-pay

## 2021-06-19 ENCOUNTER — Ambulatory Visit (INDEPENDENT_AMBULATORY_CARE_PROVIDER_SITE_OTHER): Payer: Medicare Other | Admitting: Family Medicine

## 2021-06-19 VITALS — BP 120/67 | HR 95 | Temp 97.8°F | Ht 72.0 in | Wt 204.0 lb

## 2021-06-19 DIAGNOSIS — K219 Gastro-esophageal reflux disease without esophagitis: Secondary | ICD-10-CM

## 2021-06-19 DIAGNOSIS — I152 Hypertension secondary to endocrine disorders: Secondary | ICD-10-CM

## 2021-06-19 DIAGNOSIS — E1169 Type 2 diabetes mellitus with other specified complication: Secondary | ICD-10-CM

## 2021-06-19 DIAGNOSIS — Z794 Long term (current) use of insulin: Secondary | ICD-10-CM

## 2021-06-19 DIAGNOSIS — E785 Hyperlipidemia, unspecified: Secondary | ICD-10-CM

## 2021-06-19 DIAGNOSIS — E1159 Type 2 diabetes mellitus with other circulatory complications: Secondary | ICD-10-CM

## 2021-06-19 DIAGNOSIS — M869 Osteomyelitis, unspecified: Secondary | ICD-10-CM | POA: Insufficient documentation

## 2021-06-19 DIAGNOSIS — I739 Peripheral vascular disease, unspecified: Secondary | ICD-10-CM

## 2021-06-19 DIAGNOSIS — E1140 Type 2 diabetes mellitus with diabetic neuropathy, unspecified: Secondary | ICD-10-CM

## 2021-06-19 DIAGNOSIS — I480 Paroxysmal atrial fibrillation: Secondary | ICD-10-CM | POA: Diagnosis not present

## 2021-06-19 LAB — BAYER DCA HB A1C WAIVED: HB A1C (BAYER DCA - WAIVED): 6.2 % — ABNORMAL HIGH (ref 4.8–5.6)

## 2021-06-19 MED ORDER — RIVAROXABAN 15 MG PO TABS: 15.0000 mg | ORAL_TABLET | Freq: Every day | ORAL | 3 refills | Status: DC

## 2021-06-19 MED ORDER — PANTOPRAZOLE SODIUM 40 MG PO TBEC: 40.0000 mg | DELAYED_RELEASE_TABLET | Freq: Every day | ORAL | 3 refills | Status: DC

## 2021-06-19 MED ORDER — METOPROLOL SUCCINATE ER 25 MG PO TB24: 25.0000 mg | ORAL_TABLET | Freq: Every day | ORAL | 3 refills | Status: DC

## 2021-06-19 NOTE — Progress Notes (Signed)
Subjective:  Patient ID: Todd Zhang, male    DOB: September 12, 1946, 75 y.o.   MRN: [id number]  Patient Care Team: [name], FNP as PCP - General (Family Medicine) Garald Balding, MD as Consulting Physician (Orthopedic Surgery) Steffanie Rainwater, [place] as Consulting Physician (Podiatry) Shea Evans, [name], LCSW as Social Worker (Licensed Clinical Social Worker) Ilean China, RN as Case Manager   Chief Complaint:  Hospitalization Follow-up   HPI: Todd Zhang is a 75 y.o. male presenting on 06/19/2021 for Hospitalization Follow-up   Pt presents today for follow up after several months of being in and out of the hospital. He was seen in the ED at [facility] 04/21/2021 for heat exhaustion and encephalopathy. He was rehydrated with IV fluids and discharged home. He went back to the ED 04/24/2021 and was admitted for severe sepsis secondary to acute cellulitis and osteomyelitis of left great toe. He underwent left great toe amputation. Angiogram revealed complex PVD so pt underwent LLE angioplasty, stenting, and bypass graft of LLE, reports healing well since procedures. He was discharged home 05/09/2021 with home health follow up. He developed low blood pressure and dizziness. Called EMS and was taken back to ED at [facility] on 06/01/2021. Pt had symptomatic bradycardia and hypotension. He was transferred to [facility] for further management. His medications were optimized and he was discharged home 06/10/2021.  He states he has been dong ok since discharge home. Reports pain to left lower extremity and left foot since procedures and discharge home. He did have a wound vac to his left foot from great toe amputation / infection.  Pt is very vague with history and unable to recall current medication list. Reviewed discharge summary from [place] and updated medication list. He is requesting something for pain, has not tried tylenol as he states it does not work. He  denies fever, chills, weakness, focal neurological deficits, increased weakness or confusion, chest pain, palpitations, orthopnea, or syncope.     Relevant past medical, surgical, family, and social history reviewed and updated as indicated.  Allergies and medications reviewed and updated. Data reviewed: Chart in Epic.   Past Medical History:  Diagnosis Date   Atrial fibrillation ([place])    Cervical spinal stenosis    DDD (degenerative disc disease), cervical    DDD (degenerative disc disease), lumbar    Diabetes mellitus without complication ([place])    Hyperlipidemia    Hypertension    Peripheral artery disease ([place])     Past Surgical History:  Procedure Laterality Date   ABDOMINAL AORTOGRAM W/LOWER EXTREMITY N/A 04/30/2021   Procedure: ABDOMINAL AORTOGRAM W/LOWER EXTREMITY;  Surgeon: Cherre Robins, MD;  Location: [place] [facility];  Service: Cardiovascular;  Laterality: N/A;   AMPUTATION TOE     AMPUTATION TOE Left 05/04/2021   Procedure: LEFT GREAT TOE AMPUTATION;  Surgeon: Marty Heck, MD;  Location: [place];  Service: Vascular;  Laterality: Left;   BYPASS GRAFT FEMORAL-PERONEAL Left 05/04/2021   Procedure: LEFT COMMON FEMORAL-TIBIOPERONEAL TRUNK BYPASS;  Surgeon: Marty Heck, MD;  Location: [place];  Service: Vascular;  Laterality: Left;   ENDARTERECTOMY FEMORAL Left 05/04/2021   Procedure: LEFT COMMON FEMORAL ENDARTERECTOMY WITH BOVINE PATCH;  Surgeon: Marty Heck, MD;  Location: [place];  Service: Vascular;  Laterality: Left;   ENDARTERECTOMY TIBIOPERONEAL Left 05/04/2021   Procedure: LEFT BELOW KNEE [place] WITH  TIBIOPERONEAL ENDARTERECTOMY;  Surgeon: Marty Heck, MD;  Location: [place];  Service:  Vascular;  Laterality: Left;   ENDOVEIN HARVEST OF GREATER SAPHENOUS VEIN  05/04/2021   Procedure: HARVEST OF LEFT GREATER SAPHENOUS VEIN;  Surgeon: Marty Heck, MD;  Location: [place];  Service: Vascular;;   INSERTION OF ILIAC STENT Left 05/04/2021    Procedure: LEFT COMMON ILLIAC ATERY ANGIOPLASTY WITH STENT;  Surgeon: Marty Heck, MD;  Location: [place];  Service: Vascular;  Laterality: Left;   INTRAOPERATIVE ARTERIOGRAM Left 05/04/2021   Procedure: LEFT ARTERY ARTERIOGRAM;  Surgeon: Marty Heck, MD;  Location: [place];  Service: Vascular;  Laterality: Left;   IRRIGATION AND DEBRIDEMENT FOOT Left 05/04/2021   Procedure: IRRIGATION AND DEBRIDEMENT FOOT;  Surgeon: Marty Heck, MD;  Location: [place];  Service: Vascular;  Laterality: Left;   KNEE SURGERY Left    NOSE SURGERY     TEE WITHOUT CARDIOVERSION N/A 05/01/2021   Procedure: TRANSESOPHAGEAL ECHOCARDIOGRAM (TEE);  Surgeon: Skeet Latch, MD;  Location: [place];  Service: Cardiovascular;  Laterality: N/A;    Social History   Socioeconomic History   Marital status: Divorced    Spouse name: Not on file   Number of children: 2   Years of education: 11   Highest education level: 11th grade  Occupational History   Occupation: Disabled  Tobacco Use   Smoking status: Former    Packs/day: 1.00    Types: Cigarettes    Start date: 06/19/1966    Quit date: 06/20/2011    Years since quitting: 10.0   Smokeless tobacco: Never  Vaping Use   Vaping Use: Never used  Substance and Sexual Activity   Alcohol use: No    Comment: quit drinking in 1982   Drug use: Not Currently    Comment: marijuana and cocaine in the past, quit in 2012   Sexual activity: Not Currently  Other Topics Concern   Not on file  Social History Narrative   Not on file   Social Determinants of Health   Financial Resource Strain: Not on file  Food Insecurity: Not on file  Transportation Needs: No Transportation Needs   Lack of Transportation (Medical): No   Lack of Transportation (Non-Medical): No  Physical Activity: Inactive   Days of Exercise per Week: 0 days   Minutes of Exercise per Session: 0 min  Stress: Stress Concern Present   Feeling of Stress : Rather much  Social  Connections: Socially Isolated   Frequency of Communication with Friends and Family: Once a week   Frequency of Social Gatherings with Friends and Family: Once a week   Attends Religious Services: Never   Marine scientist or Organizations: No   Attends Archivist Meetings: Never   Marital Status: Widowed  Intimate Partner Violence: Not on file    Outpatient Encounter Medications as of 06/19/2021  Medication Sig   alendronate (FOSAMAX) 70 MG tablet TAKE 1 TABLET BY MOUTH ONCE A WEEK. TAKE WITH A FULL GLASS OF WATER ON AN EMPTY STOMACH   atorvastatin (LIPITOR) 40 MG tablet Take 1 tablet (40 mg total) by mouth daily.   benazepril (LOTENSIN) 5 MG tablet TAKE 1 TABLET BY MOUTH EVERY DAY   cetirizine (ZYRTEC) 10 MG tablet Take 10 mg by mouth daily.    clopidogrel (PLAVIX) 75 MG tablet Take 1 tablet (75 mg total) by mouth daily.   diltiazem (CARDIZEM CD) 240 MG 24 hr capsule Take 1 capsule (240 mg total) by mouth daily.   fenofibrate (TRICOR) 145 MG tablet Take 1 tablet (145 mg  total) by mouth daily.   gabapentin (NEURONTIN) 300 MG capsule Take 1 capsule (300 mg total) by mouth 4 (four) times daily. TAKE 1 CAPSULE BY MOUTH THREE TIMES DAILY FOR PAIN   glucose blood (ACCU-CHEK AVIVA PLUS) test strip CHECK BLOOD SUGAR FOUR TIMES DAILY Dx E11.40   insulin regular (HUMULIN R) 100 units/mL injection INJECT 10 UNITS INTO THE SKIN THREE TIMES DAILY BEFORE MEALS   Insulin Syringe-Needle U-100 (GLOBAL INJECT EASE INSULIN SYR) 31G X 5/16" 0.3 ML MISC USE WITH INSULIN EVERY DAY Dx E11.40   metFORMIN (GLUCOPHAGE) 500 MG tablet Take 1 tablet (500 mg total) by mouth 2 (two) times daily.   metoprolol succinate (TOPROL-XL) 25 MG 24 hr tablet Take 1 tablet (25 mg total) by mouth daily.   Multiple Vitamin (MULTIVITAMIN) tablet Take 1 tablet by mouth daily.   mupirocin ointment (BACTROBAN) 2 % Apply 1 application topically daily. Left Great Toe   pantoprazole (PROTONIX) 40 MG tablet Take 1 tablet (40  mg total) by mouth daily.   Rivaroxaban (XARELTO) 15 MG TABS tablet Take 1 tablet (15 mg total) by mouth daily with supper.   tamsulosin (FLOMAX) 0.4 MG CAPS capsule Take 1 capsule (0.4 mg total) by mouth daily.   triamcinolone cream (KENALOG) 0.5 % APPLY TO THE AFFECTED AREA(S) THREE TIMES DAILY   vitamin B-12 (CYANOCOBALAMIN) 500 MCG tablet Take 500 mcg by mouth daily.   [DISCONTINUED] HYDROcodone-acetaminophen (NORCO/VICODIN) 5-325 MG tablet Take 1 tablet by mouth every 4 (four) hours as needed.   [DISCONTINUED] metoprolol succinate (TOPROL-XL) 50 MG 24 hr tablet TAKE 1 TABLET BY MOUTH DAILY, TAKE WITH FOOD OR IMMEDIATELY FOLLOWING A MEAL   [DISCONTINUED] omeprazole (PRILOSEC) 20 MG capsule Take 1 capsule (20 mg total) by mouth daily.   [DISCONTINUED] XARELTO 20 MG TABS tablet Take 1 tablet (20 mg total) by mouth daily.   No facility-administered encounter medications on file as of 06/19/2021.    Allergies  Allergen Reactions   Asa [Aspirin] Hives   Penicillins Hives    Hives + vomiting as an adult  - tolerated Ceftriaxone 04/2021    Review of Systems  Constitutional:  Positive for activity change. Negative for appetite change, chills, diaphoresis, fatigue, fever and unexpected weight change.  HENT: Negative.    Eyes: Negative.   Respiratory:  Negative for cough, chest tightness and shortness of breath.   Cardiovascular:  Negative for chest pain, palpitations and leg swelling.  Gastrointestinal:  Negative for abdominal pain, blood in stool, constipation, diarrhea, nausea and vomiting.  Endocrine: Negative.   Genitourinary:  Negative for decreased urine volume, difficulty urinating, dysuria, frequency, penile swelling and urgency.  Musculoskeletal:  Positive for arthralgias, gait problem and myalgias. Negative for back pain, joint swelling, neck pain and neck stiffness.  Skin: Negative.   Allergic/Immunologic: Negative.   Neurological:  Negative for tremors, seizures, syncope, facial  asymmetry, speech difficulty, weakness, light-headedness, numbness and headaches.  Hematological: Negative.   Psychiatric/Behavioral:  Negative for confusion, hallucinations, sleep disturbance and suicidal ideas.   All other systems reviewed and are negative.      Objective:  BP 120/67   Pulse 95   Temp 97.8 F (36.6 C)   Ht 6' (1.829 m)   Wt 204 lb (92.5 kg)   SpO2 97%   BMI 27.67 kg/m    Wt Readings from Last 3 Encounters:  06/19/21 204 lb (92.5 kg)  05/01/21 221 lb 5.5 oz (100.4 kg)  01/28/21 229 lb (103.9 kg)    Physical Exam  Vitals and nursing note reviewed.  Constitutional:      General: He is not in acute distress.    Appearance: Normal appearance. He is well-developed and well-groomed. He is morbidly obese. He is not ill-appearing, toxic-appearing or diaphoretic.  HENT:     Head: Normocephalic and atraumatic.     Jaw: There is normal jaw occlusion.     Right Ear: Hearing normal.     Left Ear: Hearing normal.     Nose: Nose normal.     Mouth/Throat:     Lips: Pink.     Mouth: Mucous membranes are moist.     Pharynx: Oropharynx is clear. Uvula midline.  Eyes:     General: Lids are normal.     Extraocular Movements: Extraocular movements intact.     Conjunctiva/sclera: Conjunctivae normal.     Pupils: Pupils are equal, round, and reactive to light.  Neck:     Thyroid: No thyroid mass, thyromegaly or thyroid tenderness.     Vascular: No carotid bruit or JVD.     Trachea: Trachea and phonation normal.  Cardiovascular:     Rate and Rhythm: Normal rate. Rhythm irregularly irregular.     Chest Wall: PMI is not displaced.     Pulses: Normal pulses.     Heart sounds: Normal heart sounds. No murmur heard.   No friction rub. No gallop.  Pulmonary:     Effort: Pulmonary effort is normal. No respiratory distress.     Breath sounds: Normal breath sounds. No stridor. No wheezing, rhonchi or rales.  Chest:     Chest wall: No tenderness.  Abdominal:     General:  Bowel sounds are normal. There is no distension or abdominal bruit.     Palpations: Abdomen is soft. There is no hepatomegaly or splenomegaly.     Tenderness: There is no abdominal tenderness. There is no right CVA tenderness or left CVA tenderness.     Hernia: No hernia is present.  Musculoskeletal:        General: Normal range of motion.     Cervical back: Normal range of motion and neck supple.     Right lower leg: No edema.     Left lower leg: No edema.     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 (one toe amputation)    Left Lower Extremity: Left leg is amputated below ankle. (Two toes amputated) Feet:     Comments: Two toes amputated from left foot and one toe amputated from right foot. Left food bandaged and in post op shoe Lymphadenopathy:     Cervical: No cervical adenopathy.  Skin:    General: Skin is warm and dry.     Capillary Refill: Capillary refill takes less than 2 seconds.     Coloration: Skin is not cyanotic, jaundiced or pale.     Findings: Erythema and wound present. No rash.  Neurological:     General: No focal deficit present.     Mental Status: He is alert and oriented to person, place, and time.     Cranial Nerves: Cranial nerves are intact.     Sensory: Sensation is intact.     Motor: Motor function is intact.     Coordination: Coordination is intact.     Gait: Gait abnormal (antalgic, uses walker).     Deep Tendon Reflexes: Reflexes are normal and symmetric.  Psychiatric:        Attention and Perception: Attention and perception normal.  Mood and Affect: Mood and affect normal.        Speech: Speech normal.        Behavior: Behavior normal. Behavior is cooperative.        Thought Content: Thought content normal.        Cognition and Memory: Cognition and memory normal.        Judgment: Judgment normal.    Results for orders placed or performed in visit on 06/19/21  Bayer DCA Hb A1c Waived  Result Value Ref Range   HB A1C  (BAYER DCA - WAIVED) 6.2 (H) 4.8 - 5.6 %       Pertinent labs & imaging results that were available during my care of the patient were reviewed by me and considered in my medical decision making.  Assessment & Plan:  Vada was seen today for hospitalization follow-up.  Diagnoses and all orders for this visit:  Type 2 diabetes mellitus with diabetic neuropathy, with long-term current use of insulin (HCC) A1C 6.2 today, well controlled. Continue current regimen. Diet and exercise encouraged. Labs pending. Follow up with PCP in 1 month.  -     CMP14+EGFR -     CBC with Differential/Platelet -     Bayer DCA Hb A1c Waived -     Thyroid Panel With TSH -     Lipid panel  Osteomyelitis of left foot, unspecified type (HCC) Toe amputation as noted. Wound care by homehealth nurse.   Peripheral artery disease (HCC) On plavix and Xarelto. Has not filled Xarelto since discharge from hospital, will resend today.  -     Rivaroxaban (XARELTO) 15 MG TABS tablet; Take 1 tablet (15 mg total) by mouth daily with supper.  Paroxysmal atrial fibrillation (HCC) On plavix and xarelto. Rate controlled. Follow up with cardiology as planned.  -     Rivaroxaban (XARELTO) 15 MG TABS tablet; Take 1 tablet (15 mg total) by mouth daily with supper.  Hyperlipidemia associated with type 2 diabetes mellitus ([place]) Will check labs today. Diet and exercise encouraged. Continue current regimen.  -     Lipid panel  Hypertension associated with diabetes ([place]) Well controlled, labs pending, DASH diet and exercise encouraged. Continue current regimen.  -     CMP14+EGFR -     CBC with Differential/Platelet -     Thyroid Panel With TSH -     Lipid panel -     metoprolol succinate (TOPROL-XL) 25 MG 24 hr tablet; Take 1 tablet (25 mg total) by mouth daily.  Gastroesophageal reflux disease without esophagitis Was to switch from omeprazole to protonix at time of last discharge, will send in today.  -     pantoprazole  (PROTONIX) 40 MG tablet; Take 1 tablet (40 mg total) by mouth daily.     Continue all other maintenance medications.  Follow up plan: Return in about 1 month (around 07/20/2021) for with PCP.   Continue healthy lifestyle choices, including diet (rich in fruits, vegetables, and lean proteins, and low in salt and simple carbohydrates) and exercise (at least 30 minutes of moderate physical activity daily).   The above assessment and management plan was discussed with the patient. The patient verbalized understanding of and has agreed to the management plan. Patient is aware to call the clinic if they develop any new symptoms or if symptoms persist or worsen. Patient is aware when to return to the clinic for a follow-up visit. Patient educated on when it is appropriate to go to the  emergency department.   Monia Pouch, FNP-C [facility] [phone]

## 2021-06-19 NOTE — Patient Instructions (Signed)
See PCP in 1 month

## 2021-06-20 LAB — CMP14+EGFR
ALT: 11 IU/L (ref 0–44)
AST: 19 IU/L (ref 0–40)
Albumin/Globulin Ratio: 1.4 (ref 1.2–2.2)
Albumin: 4.2 g/dL (ref 3.7–4.7)
Alkaline Phosphatase: 48 IU/L (ref 44–121)
BUN/Creatinine Ratio: 15 (ref 10–24)
BUN: 28 mg/dL — ABNORMAL HIGH (ref 8–27)
Bilirubin Total: 0.6 mg/dL (ref 0.0–1.2)
CO2: 22 mmol/L (ref 20–29)
Calcium: 10 mg/dL (ref 8.6–10.2)
Chloride: 98 mmol/L (ref 96–106)
Creatinine, Ser: 1.88 mg/dL — ABNORMAL HIGH (ref 0.76–1.27)
Globulin, Total: 2.9 g/dL (ref 1.5–4.5)
Glucose: 104 mg/dL — ABNORMAL HIGH (ref 70–99)
Potassium: 4.5 mmol/L (ref 3.5–5.2)
Sodium: 136 mmol/L (ref 134–144)
Total Protein: 7.1 g/dL (ref 6.0–8.5)
eGFR: 37 mL/min/{1.73_m2} — ABNORMAL LOW (ref >59)

## 2021-06-20 LAB — THYROID PANEL WITH TSH
Free Thyroxine Index: 1.8 (ref 1.2–4.9)
T3 Uptake Ratio: 25 % (ref 24–39)
T4, Total: 7.3 ug/dL (ref 4.5–12.0)
TSH: 1.67 u[IU]/mL (ref 0.450–4.500)

## 2021-06-20 LAB — CBC WITH DIFFERENTIAL/PLATELET
Basophils Absolute: 0 10*3/uL (ref 0.0–0.2)
Basos: 0 %
EOS (ABSOLUTE): 0.1 10*3/uL (ref 0.0–0.4)
Eos: 1 %
Hematocrit: 33.9 % — ABNORMAL LOW (ref 37.5–51.0)
Hemoglobin: 11.5 g/dL — ABNORMAL LOW (ref 13.0–17.7)
Immature Grans (Abs): 0 10*3/uL (ref 0.0–0.1)
Immature Granulocytes: 0 %
Lymphocytes Absolute: 2.5 10*3/uL (ref 0.7–3.1)
Lymphs: 21 %
MCH: 30.2 pg (ref 26.6–33.0)
MCHC: 33.9 g/dL (ref 31.5–35.7)
MCV: 89 fL (ref 79–97)
Monocytes Absolute: 0.9 10*3/uL (ref 0.1–0.9)
Monocytes: 8 %
Neutrophils Absolute: 8.3 10*3/uL — ABNORMAL HIGH (ref 1.4–7.0)
Neutrophils: 70 %
Platelets: 280 10*3/uL (ref 150–450)
RBC: 3.81 x10E6/uL — ABNORMAL LOW (ref 4.14–5.80)
RDW: 14.4 % (ref 11.6–15.4)
WBC: 11.8 10*3/uL — ABNORMAL HIGH (ref 3.4–10.8)

## 2021-06-20 LAB — LIPID PANEL
Chol/HDL Ratio: 2.9 ratio (ref 0.0–5.0)
Cholesterol, Total: 160 mg/dL (ref 100–199)
HDL: 56 mg/dL (ref >39)
LDL Chol Calc (NIH): 83 mg/dL (ref 0–99)
Triglycerides: 120 mg/dL (ref 0–149)
VLDL Cholesterol Cal: 21 mg/dL (ref 5–40)

## 2021-06-22 ENCOUNTER — Other Ambulatory Visit: Payer: Self-pay | Admitting: Family Medicine

## 2021-06-23 NOTE — Addendum Note (Signed)
Addended by: Angela Nevin D on: 06/23/2021 10:53 AM   Modules accepted: Orders

## 2021-06-30 ENCOUNTER — Ambulatory Visit (INDEPENDENT_AMBULATORY_CARE_PROVIDER_SITE_OTHER): Payer: Medicare Other | Admitting: Vascular Surgery

## 2021-06-30 ENCOUNTER — Encounter: Payer: Self-pay | Admitting: Vascular Surgery

## 2021-06-30 ENCOUNTER — Other Ambulatory Visit: Payer: Self-pay

## 2021-06-30 ENCOUNTER — Other Ambulatory Visit: Payer: Medicare Other

## 2021-06-30 VITALS — BP 134/72 | HR 103 | Temp 98.0°F | Resp 20 | Ht 72.0 in | Wt 207.0 lb

## 2021-06-30 DIAGNOSIS — E1159 Type 2 diabetes mellitus with other circulatory complications: Secondary | ICD-10-CM

## 2021-06-30 DIAGNOSIS — I739 Peripheral vascular disease, unspecified: Secondary | ICD-10-CM

## 2021-06-30 LAB — BAYER DCA HB A1C WAIVED: HB A1C (BAYER DCA - WAIVED): 6.3 % — ABNORMAL HIGH (ref 4.8–5.6)

## 2021-06-30 MED ORDER — COLLAGENASE 250 UNIT/GM EX OINT: 1.0000 "application " | TOPICAL_OINTMENT | Freq: Every day | CUTANEOUS | 1 refills | Status: DC

## 2021-06-30 NOTE — Progress Notes (Signed)
Patient name: Todd Zhang MRN: [id number] DOB: 1946/02/16 Sex: male  REASON FOR VISIT: Postop check  HPI: Todd Zhang is a 75 y.o. male with multiple medical comorbidities that presents for postop check.  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  We have not seen him since discharge and he has missed previous appointments.  States all of his incisions have healed.  He is putting some ointment on his toe amputation but is unclear.  States this was given to him during a recent hospitalization at an outside hospital.  Past Medical History:  Diagnosis Date   Atrial fibrillation (HCC)    Cervical spinal stenosis    DDD (degenerative disc disease), cervical    DDD (degenerative disc disease), lumbar    Diabetes mellitus without complication (HCC)    Hyperlipidemia    Hypertension    Peripheral artery disease (HCC)     Past Surgical History:  Procedure Laterality Date   ABDOMINAL AORTOGRAM W/LOWER EXTREMITY N/A 04/30/2021   Procedure: ABDOMINAL AORTOGRAM W/LOWER EXTREMITY;  Surgeon: Leonie Douglas, MD;  Location: [facility];  Service: Cardiovascular;  Laterality: N/A;   AMPUTATION TOE     AMPUTATION TOE Left 05/04/2021   Procedure: LEFT GREAT TOE AMPUTATION;  Surgeon: [name], MD;  Location: [facility] OR;  Service: Vascular;  Laterality: Left;   BYPASS GRAFT FEMORAL-PERONEAL Left 05/04/2021   Procedure: LEFT COMMON FEMORAL-TIBIOPERONEAL TRUNK BYPASS;  Surgeon: [name], MD;  Location: [facility] OR;  Service: Vascular;  Laterality: Left;   ENDARTERECTOMY FEMORAL Left 05/04/2021   Procedure: LEFT COMMON FEMORAL ENDARTERECTOMY WITH BOVINE PATCH;  Surgeon: [name], MD;  Location: [facility] OR;  Service: Vascular;  Laterality: Left;   ENDARTERECTOMY TIBIOPERONEAL Left  05/04/2021   Procedure: LEFT BELOW KNEE POPITEAL WITH  TIBIOPERONEAL ENDARTERECTOMY;  Surgeon: [name], MD;  Location: [facility] OR;  Service: Vascular;  Laterality: Left;   ENDOVEIN HARVEST OF GREATER SAPHENOUS VEIN  05/04/2021   Procedure: HARVEST OF LEFT GREATER SAPHENOUS VEIN;  Surgeon: [name], MD;  Location: [facility] OR;  Service: Vascular;;   INSERTION OF ILIAC STENT Left 05/04/2021   Procedure: LEFT COMMON ILLIAC ATERY ANGIOPLASTY WITH STENT;  Surgeon: [name], MD;  Location: [facility] OR;  Service: Vascular;  Laterality: Left;   INTRAOPERATIVE ARTERIOGRAM Left 05/04/2021   Procedure: LEFT ARTERY ARTERIOGRAM;  Surgeon: [name], MD;  Location: [facility] OR;  Service: Vascular;  Laterality: Left;   IRRIGATION AND DEBRIDEMENT FOOT Left 05/04/2021   Procedure: IRRIGATION AND DEBRIDEMENT FOOT;  Surgeon: [name], MD;  Location: [facility] OR;  Service: Vascular;  Laterality: Left;   KNEE SURGERY Left    NOSE SURGERY     TEE WITHOUT CARDIOVERSION N/A 05/01/2021   Procedure: TRANSESOPHAGEAL ECHOCARDIOGRAM (TEE);  Surgeon: Chilton Si, MD;  Location: [facility] ENDOSCOPY;  Service: Cardiovascular;  Laterality: N/A;    Family History  Problem Relation Age of Onset   Stroke Mother    Heart disease Mother        CABG   Cancer Father     SOCIAL HISTORY: Social History   Tobacco Use   Smoking status: Former    Packs/day: 1.00    Types: Cigarettes    Start date: 06/19/1966    Quit date: 06/20/2011  Years since quitting: 10.0   Smokeless tobacco: Never  Substance Use Topics   Alcohol use: No    Comment: quit drinking in 1982    Allergies  Allergen Reactions   Asa [Aspirin] Hives   Penicillins Hives    Hives + vomiting as an adult  - tolerated Ceftriaxone 04/2021    Current Outpatient Medications  Medication Sig Dispense Refill   alendronate (FOSAMAX) 70 MG tablet TAKE 1 TABLET BY MOUTH ONCE A WEEK. TAKE WITH A FULL GLASS OF WATER ON AN EMPTY STOMACH 12  tablet 1   atorvastatin (LIPITOR) 40 MG tablet Take 1 tablet (40 mg total) by mouth daily. 90 tablet 1   benazepril (LOTENSIN) 5 MG tablet TAKE 1 TABLET BY MOUTH EVERY DAY 90 tablet 1   cetirizine (ZYRTEC) 10 MG tablet Take 10 mg by mouth daily.      clopidogrel (PLAVIX) 75 MG tablet TAKE 1 TABLET BY MOUTH EVERY DAY 30 tablet 0   fenofibrate (TRICOR) 145 MG tablet Take 1 tablet (145 mg total) by mouth daily. 90 tablet 1   gabapentin (NEURONTIN) 300 MG capsule Take 1 capsule (300 mg total) by mouth 4 (four) times daily. TAKE 1 CAPSULE BY MOUTH THREE TIMES DAILY FOR PAIN 120 capsule 5   glucose blood (ACCU-CHEK AVIVA PLUS) test strip CHECK BLOOD SUGAR FOUR TIMES DAILY Dx E11.40 400 strip 3   insulin regular (HUMULIN R) 100 units/mL injection INJECT 10 UNITS INTO THE SKIN THREE TIMES DAILY BEFORE MEALS 10 mL 5   Insulin Syringe-Needle U-100 (GLOBAL INJECT EASE INSULIN SYR) 31G X 5/16" 0.3 ML MISC USE WITH INSULIN EVERY DAY Dx E11.40 100 each 3   metFORMIN (GLUCOPHAGE) 500 MG tablet Take 1 tablet (500 mg total) by mouth 2 (two) times daily. 180 tablet 1   metoprolol succinate (TOPROL-XL) 25 MG 24 hr tablet Take 1 tablet (25 mg total) by mouth daily. 90 tablet 3   Multiple Vitamin (MULTIVITAMIN) tablet Take 1 tablet by mouth daily.     mupirocin ointment (BACTROBAN) 2 % Apply 1 application topically daily. Left Great Toe     pantoprazole (PROTONIX) 40 MG tablet Take 1 tablet (40 mg total) by mouth daily. 30 tablet 3   Rivaroxaban (XARELTO) 15 MG TABS tablet Take 1 tablet (15 mg total) by mouth daily with supper. 30 tablet 3   tamsulosin (FLOMAX) 0.4 MG CAPS capsule Take 1 capsule (0.4 mg total) by mouth daily. 90 capsule 1   triamcinolone cream (KENALOG) 0.5 % APPLY TO THE AFFECTED AREA(S) THREE TIMES DAILY     vitamin B-12 (CYANOCOBALAMIN) 500 MCG tablet Take 500 mcg by mouth daily.     No current facility-administered medications for this visit.    REVIEW OF SYSTEMS:  [X]  denotes positive  finding, [ ]  denotes negative finding Cardiac  Comments:  Chest pain or chest pressure:    Shortness of breath upon exertion:    Short of breath when lying flat:    Irregular heart rhythm:        Vascular    Pain in calf, thigh, or hip brought on by ambulation:    Pain in feet at night that wakes you up from your sleep:     Blood clot in your veins:    Leg swelling:         Pulmonary    Oxygen at home:    Productive cough:     Wheezing:         Neurologic  Sudden weakness in arms or legs:     Sudden numbness in arms or legs:     Sudden onset of difficulty speaking or slurred speech:    Temporary loss of vision in one eye:     Problems with dizziness:         Gastrointestinal    Blood in stool:     Vomited blood:         Genitourinary    Burning when urinating:     Blood in urine:        Psychiatric    Major depression:         Hematologic    Bleeding problems:    Problems with blood clotting too easily:        Skin    Rashes or ulcers:        Constitutional    Fever or chills:      PHYSICAL EXAM: Vitals:   06/30/21 0842  BP: 134/72  Pulse: (!) 103  Resp: 20  Temp: 98 F (36.7 C)  SpO2: 92%  Weight: 207 lb (93.9 kg)  Height: 6' (1.829 m)    GENERAL: The patient is a well-nourished male, in no acute distress. The vital signs are documented above. CARDIAC: There is a regular rate and rhythm.  VASCULAR:  Left femoral pulse palpable Left groin and leg incisions healed Left PT palpable Open ray amputation of left great toe granulating as pictured below     DATA:   None Assessment/Plan:  75 year old male status post left common iliac stenting with a left common femoral endarterectomy and bovine patch and then a left common femoral to TP trunk bypass with vein including an open ray amputation of the left great toe and foot debridement on 05/04/2021.  His left groin and leg incision leg incisions are healing nicely.  He has a palpable PT pulse at  the left ankle.  Very pleased with his progress given we have not seen him since his discharge from the hospital.  His open ray amputation does appear to be granulating in but needs some additional wound care.  I offered a referral to the wound clinic which he declined and states he has no transportation.  Instead I discussed washing this with soap and water with Dial soap and then placing Santyl cream on it.  I bring him back in a month for wound check and for noninvasive studies to check on the iliac stent and bypass.   [name], MD [facility] Office: [phone]

## 2021-07-01 LAB — CBC WITH DIFFERENTIAL/PLATELET
Basophils Absolute: 0 10*3/uL (ref 0.0–0.2)
Basos: 0 %
EOS (ABSOLUTE): 0.1 10*3/uL (ref 0.0–0.4)
Eos: 1 %
Hematocrit: 32.4 % — ABNORMAL LOW (ref 37.5–51.0)
Hemoglobin: 11.3 g/dL — ABNORMAL LOW (ref 13.0–17.7)
Immature Grans (Abs): 0 10*3/uL (ref 0.0–0.1)
Immature Granulocytes: 0 %
Lymphocytes Absolute: 2.6 10*3/uL (ref 0.7–3.1)
Lymphs: 25 %
MCH: 29.8 pg (ref 26.6–33.0)
MCHC: 34.9 g/dL (ref 31.5–35.7)
MCV: 86 fL (ref 79–97)
Monocytes Absolute: 0.6 10*3/uL (ref 0.1–0.9)
Monocytes: 6 %
Neutrophils Absolute: 7 10*3/uL (ref 1.4–7.0)
Neutrophils: 68 %
Platelets: 312 10*3/uL (ref 150–450)
RBC: 3.79 x10E6/uL — ABNORMAL LOW (ref 4.14–5.80)
RDW: 13.7 % (ref 11.6–15.4)
WBC: 10.3 10*3/uL (ref 3.4–10.8)

## 2021-07-01 LAB — CMP14+EGFR
ALT: 10 IU/L (ref 0–44)
AST: 12 IU/L (ref 0–40)
Albumin/Globulin Ratio: 1.4 (ref 1.2–2.2)
Albumin: 4 g/dL (ref 3.7–4.7)
Alkaline Phosphatase: 51 IU/L (ref 44–121)
BUN/Creatinine Ratio: 17 (ref 10–24)
BUN: 28 mg/dL — ABNORMAL HIGH (ref 8–27)
Bilirubin Total: 0.2 mg/dL (ref 0.0–1.2)
CO2: 21 mmol/L (ref 20–29)
Calcium: 9.2 mg/dL (ref 8.6–10.2)
Chloride: 102 mmol/L (ref 96–106)
Creatinine, Ser: 1.62 mg/dL — ABNORMAL HIGH (ref 0.76–1.27)
Globulin, Total: 2.9 g/dL (ref 1.5–4.5)
Glucose: 137 mg/dL — ABNORMAL HIGH (ref 70–99)
Potassium: 4.2 mmol/L (ref 3.5–5.2)
Sodium: 139 mmol/L (ref 134–144)
Total Protein: 6.9 g/dL (ref 6.0–8.5)
eGFR: 44 mL/min/{1.73_m2} — ABNORMAL LOW (ref >59)

## 2021-07-03 ENCOUNTER — Other Ambulatory Visit: Payer: Self-pay

## 2021-07-03 ENCOUNTER — Telehealth: Payer: Self-pay

## 2021-07-03 DIAGNOSIS — I739 Peripheral vascular disease, unspecified: Secondary | ICD-10-CM

## 2021-07-03 NOTE — Telephone Encounter (Signed)
Matthias Hughs from [facility] called to confirm new wound care orders for patient. Advised wash with dial soap and water and apply Santyl appointment.

## 2021-07-08 ENCOUNTER — Ambulatory Visit (INDEPENDENT_AMBULATORY_CARE_PROVIDER_SITE_OTHER): Payer: Medicare Other | Admitting: *Deleted

## 2021-07-08 DIAGNOSIS — E1140 Type 2 diabetes mellitus with diabetic neuropathy, unspecified: Secondary | ICD-10-CM

## 2021-07-08 DIAGNOSIS — Z794 Long term (current) use of insulin: Secondary | ICD-10-CM

## 2021-07-08 DIAGNOSIS — M869 Osteomyelitis, unspecified: Secondary | ICD-10-CM

## 2021-07-08 DIAGNOSIS — E1159 Type 2 diabetes mellitus with other circulatory complications: Secondary | ICD-10-CM
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
## 2021-07-13 DIAGNOSIS — E1140 Type 2 diabetes mellitus with diabetic neuropathy, unspecified: Secondary | ICD-10-CM

## 2021-07-13 DIAGNOSIS — I152 Hypertension secondary to endocrine disorders: Secondary | ICD-10-CM

## 2021-07-13 DIAGNOSIS — Z794 Long term (current) use of insulin: Secondary | ICD-10-CM | POA: Diagnosis not present

## 2021-07-13 DIAGNOSIS — E1159 Type 2 diabetes mellitus with other circulatory complications: Secondary | ICD-10-CM | POA: Diagnosis not present

## 2021-07-14 ENCOUNTER — Ambulatory Visit (INDEPENDENT_AMBULATORY_CARE_PROVIDER_SITE_OTHER): Payer: Medicare Other | Admitting: Licensed Clinical Social Worker

## 2021-07-14 DIAGNOSIS — I739 Peripheral vascular disease, unspecified: Secondary | ICD-10-CM

## 2021-07-14 DIAGNOSIS — J441 Chronic obstructive pulmonary disease with (acute) exacerbation: Secondary | ICD-10-CM

## 2021-07-14 DIAGNOSIS — I152 Hypertension secondary to endocrine disorders: Secondary | ICD-10-CM

## 2021-07-14 DIAGNOSIS — F411 Generalized anxiety disorder: Secondary | ICD-10-CM

## 2021-07-14 DIAGNOSIS — I480 Paroxysmal atrial fibrillation: Secondary | ICD-10-CM

## 2021-07-14 DIAGNOSIS — E1159 Type 2 diabetes mellitus with other circulatory complications: Secondary | ICD-10-CM

## 2021-07-14 DIAGNOSIS — E1140 Type 2 diabetes mellitus with diabetic neuropathy, unspecified: Secondary | ICD-10-CM

## 2021-07-14 NOTE — Patient Instructions (Addendum)
Visit Information  Patient  Goals:Protect My Health (patient). Manage housing needs of client; client to talk with [facility] nursing staff about breathing challenges of client.   Timeframe:  Short-Term Goal Priority:  High Progress:  On Track Start Date:         07/14/21                Expected End Date:       10/12/21            Follow Up Date 09/09/21 at 11:15 AM   Protect My Health (Patient)  Manage housing needs of client ; client to talk with [facility] nursing staff about breathing challenges of client   Why is this important?   Screening tests can find diseases early when they are easier to treat.  Your doctor or nurse will talk with you about which tests are important for you.  Getting shots for common diseases like the flu and shingles will help prevent them.     Patient Coping Skills: Attends scheduled medical appointments No transport needs Completes ADLs daily  Patient deficits:   Mobility issues Housing needs  Patient Goals:   Patient will call RNCM or LCSW as needed in next 30 days for CCM support Patient will attend all scheduled client medical appointments in next 30 days Patient will communicate regularly with his brother, Riley Lam, in next 30 days to discuss ongoing needs of client -  Follow Up Plan: LCSW to call client or his brother, Riley Lam, on 09/09/21 at 11:15 AM to assess client needs at that time    Kelton Pillar.Shaye Elling MSW, LCSW Licensed Clinical Social Worker [facility] Care Management [phone]

## 2021-07-14 NOTE — Chronic Care Management (AMB) (Signed)
Chronic Care Management    Clinical Social Work Note  07/14/2021 Name: Todd Zhang MRN: [id number] DOB: [birth year]  Todd Zhang is a 75 y.o. year old male who is a primary care patient of [name], [place]. The CCM team was consulted to assist the patient with chronic disease management and/or care coordination needs related to: [employer] .   Engaged with patient by telephone for follow up visit in response to provider referral for social work chronic care management and care coordination services.   Consent to Services:  The patient was given information about Chronic Care Management services, agreed to services, and gave verbal consent prior to initiation of services.  Please see initial visit note for detailed documentation.   Patient agreed to services and consent obtained.   Assessment: Review of patient past medical history, allergies, medications, and health status, including review of relevant consultants reports was performed today as part of a comprehensive evaluation and provision of chronic care management and care coordination services.     SDOH (Social Determinants of Health) assessments and interventions performed:  SDOH Interventions    Flowsheet Row Most Recent Value  SDOH Interventions   Physical Activity Interventions Other (Comments)  [walking challenges,  uses a cane or a walker to help him ambulate]  Stress Interventions Provide Counseling  [client has stress related to managing in home care needs. client has stress related to managing medical conditions]  Depression Interventions/Treatment  --  [informed client of LCSW support and of RNCM support]        Advanced Directives Status: See Vynca application for related entries.  CCM Care Plan  Allergies  Allergen Reactions   Asa [Aspirin] Hives   Penicillins Hives    Hives + vomiting as an adult  - tolerated Ceftriaxone 04/2021    Outpatient Encounter Medications as of 07/14/2021   Medication Sig   alendronate (FOSAMAX) 70 MG tablet TAKE 1 TABLET BY MOUTH ONCE A WEEK. TAKE WITH A FULL GLASS OF WATER ON AN EMPTY STOMACH   atorvastatin (LIPITOR) 40 MG tablet Take 1 tablet (40 mg total) by mouth daily.   benazepril (LOTENSIN) 5 MG tablet TAKE 1 TABLET BY MOUTH EVERY DAY   cetirizine (ZYRTEC) 10 MG tablet Take 10 mg by mouth daily.    clopidogrel (PLAVIX) 75 MG tablet TAKE 1 TABLET BY MOUTH EVERY DAY   collagenase (SANTYL) ointment Apply 1 application topically daily.   fenofibrate (TRICOR) 145 MG tablet Take 1 tablet (145 mg total) by mouth daily.   gabapentin (NEURONTIN) 300 MG capsule Take 1 capsule (300 mg total) by mouth 4 (four) times daily. TAKE 1 CAPSULE BY MOUTH THREE TIMES DAILY FOR PAIN   glucose blood (ACCU-CHEK AVIVA PLUS) test strip CHECK BLOOD SUGAR FOUR TIMES DAILY Dx E11.40   insulin regular (HUMULIN R) 100 units/mL injection INJECT 10 UNITS INTO THE SKIN THREE TIMES DAILY BEFORE MEALS   Insulin Syringe-Needle U-100 (GLOBAL INJECT EASE INSULIN SYR) 31G X 5/16" 0.3 ML MISC USE WITH INSULIN EVERY DAY Dx E11.40   metFORMIN (GLUCOPHAGE) 500 MG tablet Take 1 tablet (500 mg total) by mouth 2 (two) times daily.   metoprolol succinate (TOPROL-XL) 25 MG 24 hr tablet Take 1 tablet (25 mg total) by mouth daily.   Multiple Vitamin (MULTIVITAMIN) tablet Take 1 tablet by mouth daily.   mupirocin ointment (BACTROBAN) 2 % Apply 1 application topically daily. Left Great Toe   pantoprazole (PROTONIX) 40 MG tablet Take 1 tablet (40 mg total) by  mouth daily.   Rivaroxaban (XARELTO) 15 MG TABS tablet Take 1 tablet (15 mg total) by mouth daily with supper.   tamsulosin (FLOMAX) 0.4 MG CAPS capsule Take 1 capsule (0.4 mg total) by mouth daily.   triamcinolone cream (KENALOG) 0.5 % APPLY TO THE AFFECTED AREA(S) THREE TIMES DAILY   vitamin B-12 (CYANOCOBALAMIN) 500 MCG tablet Take 500 mcg by mouth daily.   No facility-administered encounter medications on file as of 07/14/2021.     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 ([place]) 06/02/2021   History of sepsis 06/02/2021   S/P amputation of lesser toe, right ([place]) 06/02/2021   Status post amputation of lesser toe of left foot ([place]) 06/02/2021   Acquired absence of other left toe(s) ([place]) 05/08/2021   Sepsis due to group B Streptococcus ([place]) 04/26/2021   Cellulitis of great toe, left    Acute osteomyelitis of toe of left foot ([place]) 04/25/2021   Cellulitis and abscess of foot    Diabetic foot infection ([place]) 04/24/2021   Sepsis due to undetermined organism ([place]) 04/24/2021   Atrial fibrillation with RVR ([place]) 04/24/2021   CKD (chronic kidney disease) stage 3, GFR 30-59 ml/min (HCC) 04/24/2021   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 09/13/2020   Pure hypercholesterolemia 07/16/2020   Peripheral artery disease ([place])    COPD exacerbation ([place]) 05/14/2019   DDD (degenerative disc disease), cervical 11/06/2018   Cervical spinal stenosis 11/06/2018   Generalized anxiety disorder 11/29/2017   Benign prostatic hyperplasia with incomplete bladder emptying 11/28/2017   Gastroesophageal reflux disease without esophagitis 09/07/2017   Essential hypertension 09/01/2017   Onychomycosis 09/01/2017   History of cardioversion 08/28/2015   Type 2 diabetes mellitus with diabetic neuropathy, with long-term current use of insulin ([place]) 06/19/2014   Back pain 06/19/2014   Neck pain 06/19/2014    Conditions to be addressed/monitored: monitor in home care needs of client; monitor housing needs of client; monitor mobility of client  Care Plan : LCSW Care plan  Updates made by Katha Cabal, LCSW since 07/14/2021 12:00 AM     Problem: Coping Skills (General Plan of Care)      Goal: Coping Skills Enhanced: manage housing needs of client; manage home care needs   Start Date: 07/14/2021   Expected End Date: 10/12/2021  This Visit's Progress: On track  Recent Progress: On track  Priority: High  Note:   Current barriers:   Patient in need of assistance with connecting to community resources for possible help with housing needs of client Patient is unable to independently navigate community resource options without care coordination support Mobility issues and pain issues Decreased energy Breathing challenges In home care needs  Clinical Goals:   LCSW to call client in next 30 days to discuss housing needs and housing situation of client Client to call RNCM or LCSW as needed in next 30 days for CCM program support LCSW to communicate with client in next 30 days to discuss in home care support for client Client to call RNCM as needed in next 30 days for CCM nursing support  Clinical Interventions:  Collaboration with [name], [place] regarding development and update of comprehensive plan of care as evidenced by provider attestation and co-signature Discussed with client his current needs, including sleeping issues of client, appetite of client, and pain issues of client Discussed with client transport  needs of client. 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 He said his sister often helps him in driving for medical appointments in [place], [place] LCSW collaborated today with RNCM, [name] about client status and about client nursing needs.  Reviewed with client his recent hospitalization at [facility]. Client said he has follow up appointment with surgeon on November 29,2022 Reviewed equipment needs with client Reviewed medication procurement with client Reviewed ambulation needs of client. Client said he uses a cane or a walker to help him ambulate  Patient Coping Skills: Attends scheduled medical appointments No transport needs Completes ADLs daily  Patient deficits:   Mobility issues Housing needs  Patient Goals:    Patient will call RNCM or LCSW as needed in next 30 days for CCM support Patient will attend all scheduled client medical appointments in next 30 days Patient will communicate regularly with brother, Riley Lam, in next 30 days to discuss ongoing needs of client -  Follow Up Plan: LCSW to call client or his brother, Riley Lam on 09/09/21 at 11:15 AM to assess client needs at that time      Kelton Pillar.Amel Gianino MSW, LCSW Licensed Clinical Social Worker [facility] Care Management [phone]

## 2021-07-16 LAB — HM DIABETES EYE EXAM

## 2021-07-20 ENCOUNTER — Other Ambulatory Visit: Payer: Self-pay

## 2021-07-20 ENCOUNTER — Ambulatory Visit (INDEPENDENT_AMBULATORY_CARE_PROVIDER_SITE_OTHER): Payer: Medicare Other | Admitting: Nurse Practitioner

## 2021-07-20 ENCOUNTER — Telehealth: Payer: Self-pay | Admitting: Nurse Practitioner

## 2021-07-20 ENCOUNTER — Encounter: Payer: Self-pay | Admitting: Nurse Practitioner

## 2021-07-20 VITALS — BP 123/74 | HR 95 | Temp 97.7°F | Resp 20 | Ht 72.0 in | Wt 210.0 lb

## 2021-07-20 DIAGNOSIS — I4891 Unspecified atrial fibrillation: Secondary | ICD-10-CM

## 2021-07-20 DIAGNOSIS — I10 Essential (primary) hypertension: Secondary | ICD-10-CM | POA: Diagnosis not present

## 2021-07-20 DIAGNOSIS — I739 Peripheral vascular disease, unspecified: Secondary | ICD-10-CM

## 2021-07-20 DIAGNOSIS — J441 Chronic obstructive pulmonary disease with (acute) exacerbation: Secondary | ICD-10-CM | POA: Diagnosis not present

## 2021-07-20 DIAGNOSIS — K219 Gastro-esophageal reflux disease without esophagitis: Secondary | ICD-10-CM

## 2021-07-20 DIAGNOSIS — E7800 Pure hypercholesterolemia, unspecified: Secondary | ICD-10-CM

## 2021-07-20 DIAGNOSIS — G8929 Other chronic pain: Secondary | ICD-10-CM

## 2021-07-20 DIAGNOSIS — N401 Enlarged prostate with lower urinary tract symptoms: Secondary | ICD-10-CM

## 2021-07-20 DIAGNOSIS — Z794 Long term (current) use of insulin: Secondary | ICD-10-CM

## 2021-07-20 DIAGNOSIS — M5030 Other cervical disc degeneration, unspecified cervical region: Secondary | ICD-10-CM

## 2021-07-20 DIAGNOSIS — M545 Low back pain, unspecified: Secondary | ICD-10-CM

## 2021-07-20 DIAGNOSIS — N1832 Chronic kidney disease, stage 3b: Secondary | ICD-10-CM

## 2021-07-20 DIAGNOSIS — Z6828 Body mass index (BMI) 28.0-28.9, adult: Secondary | ICD-10-CM

## 2021-07-20 DIAGNOSIS — R3914 Feeling of incomplete bladder emptying: Secondary | ICD-10-CM

## 2021-07-20 DIAGNOSIS — E1140 Type 2 diabetes mellitus with diabetic neuropathy, unspecified: Secondary | ICD-10-CM

## 2021-07-20 MED ORDER — CLOPIDOGREL BISULFATE 75 MG PO TABS: 75.0000 mg | ORAL_TABLET | Freq: Every day | ORAL | 1 refills | Status: DC

## 2021-07-20 MED ORDER — INSULIN REGULAR HUMAN 100 UNIT/ML IJ SOLN: INTRAMUSCULAR | 1 refills | Status: DC

## 2021-07-20 MED ORDER — FENOFIBRATE 145 MG PO TABS: 145.0000 mg | ORAL_TABLET | Freq: Every day | ORAL | 1 refills | Status: DC

## 2021-07-20 MED ORDER — BENAZEPRIL HCL 5 MG PO TABS: 5.0000 mg | ORAL_TABLET | Freq: Every day | ORAL | 1 refills | Status: DC

## 2021-07-20 MED ORDER — GABAPENTIN 300 MG PO CAPS: 300.0000 mg | ORAL_CAPSULE | Freq: Four times a day (QID) | ORAL | 5 refills | Status: DC

## 2021-07-20 MED ORDER — ATORVASTATIN CALCIUM 40 MG PO TABS: 40.0000 mg | ORAL_TABLET | Freq: Every day | ORAL | 1 refills | Status: DC

## 2021-07-20 MED ORDER — METOPROLOL SUCCINATE ER 25 MG PO TB24: 25.0000 mg | ORAL_TABLET | Freq: Every day | ORAL | 1 refills | Status: DC

## 2021-07-20 MED ORDER — RIVAROXABAN 15 MG PO TABS: 15.0000 mg | ORAL_TABLET | Freq: Every day | ORAL | 1 refills | Status: DC

## 2021-07-20 MED ORDER — TAMSULOSIN HCL 0.4 MG PO CAPS: 0.4000 mg | ORAL_CAPSULE | Freq: Every day | ORAL | 1 refills | Status: DC

## 2021-07-20 MED ORDER — PANTOPRAZOLE SODIUM 40 MG PO TBEC: 40.0000 mg | DELAYED_RELEASE_TABLET | Freq: Every day | ORAL | 1 refills | Status: DC

## 2021-07-20 MED ORDER — METFORMIN HCL 500 MG PO TABS: 500.0000 mg | ORAL_TABLET | Freq: Two times a day (BID) | ORAL | 1 refills | Status: DC

## 2021-07-20 NOTE — Progress Notes (Signed)
Subjective:    Patient ID: Todd Zhang, male    DOB: [birth year], 75 y.o.   MRN: [id number]  Chief Complaint: Medical Management of Chronic Issues    HPI:  1. Essential hypertension No c/o chest pain, sob or headache. Does not check blood pressure at home. BP Readings from Last 3 Encounters:  07/20/21 123/74  06/30/21 134/72  06/19/21 120/67     2. 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  3. Atrial fibrillation with RVR ([place]) He denies heart racing or palpitations. He is xeralto with no bleeding problems.  4. COPD exacerbation ([place]) He is on no inhalers. Denies any breathing problems.  5. Gastroesophageal reflux disease without esophagitis Is on protonix daly for his gastric reflux.  6. Type 2 diabetes mellitus with diabetic neuropathy, with long-term current use of insulin (HCC) Fasting blood sugars are running around 120-160. Occasional around 200.  Lab Results  Component Value Date   HGBA1C 6.3 (H) 06/30/2021     7. DDD (degenerative disc disease), cervical Still back pain. He is not taking anything. Rates pain 7/10 most of the time. Activity increases pain and sitting helps a little.  8. Benign prostatic hyperplasia with incomplete bladder emptying Has trouble getting stream started sometimes. He is on flomax. Says he has never seen urology.  9. Stage 3b chronic kidney disease ([place]) Lab Results  Component Value Date   CREATININE 1.62 (H) 06/30/2021     10. Pure hypercholesterolemia Does try to watch diet but does no dedicated exercise. Lab Results  Component Value Date   CHOL 160 06/19/2021   HDL 56 06/19/2021   LDLCALC 83 06/19/2021   TRIG 120 06/19/2021   CHOLHDL 2.9 06/19/2021   11. BMI 28.0-28.9  No recent weight changes Wt Readings from Last 3 Encounters:  07/20/21 210 lb (95.3 kg)  06/30/21 207 lb (93.9 kg)  06/19/21 204 lb (92.5 kg)    BMI Readings from Last 3 Encounters:  07/20/21 28.48 kg/m  06/30/21 28.07 kg/m  06/19/21 27.67 kg/m      Outpatient Encounter Medications as of 07/20/2021  Medication Sig   alendronate (FOSAMAX) 70 MG tablet TAKE 1 TABLET BY MOUTH ONCE A WEEK. TAKE WITH A FULL GLASS OF WATER ON AN EMPTY STOMACH   atorvastatin (LIPITOR) 40 MG tablet Take 1 tablet (40 mg total) by mouth daily.   benazepril (LOTENSIN) 5 MG tablet TAKE 1 TABLET BY MOUTH EVERY DAY   cetirizine (ZYRTEC) 10 MG tablet Take 10 mg by mouth daily.    clopidogrel (PLAVIX) 75 MG tablet TAKE 1 TABLET BY MOUTH EVERY DAY   collagenase (SANTYL) ointment Apply 1 application topically daily.   fenofibrate (TRICOR) 145 MG tablet Take 1 tablet (145 mg total) by mouth daily.   gabapentin (NEURONTIN) 300 MG capsule Take 1 capsule (300 mg total) by mouth 4 (four) times daily. TAKE 1 CAPSULE BY MOUTH THREE TIMES DAILY FOR PAIN   glucose blood (ACCU-CHEK AVIVA PLUS) test strip CHECK BLOOD SUGAR FOUR TIMES DAILY Dx E11.40   insulin regular (HUMULIN R) 100 units/mL injection INJECT 10 UNITS INTO THE SKIN THREE TIMES DAILY BEFORE MEALS   Insulin Syringe-Needle U-100 (GLOBAL INJECT EASE INSULIN SYR) 31G X 5/16" 0.3 ML MISC USE WITH INSULIN EVERY DAY Dx E11.40   metFORMIN (GLUCOPHAGE) 500 MG tablet Take 1 tablet (500 mg total) by mouth 2 (two) times daily.  metoprolol succinate (TOPROL-XL) 25 MG 24 hr tablet Take 1 tablet (25 mg total) by mouth daily.   Multiple Vitamin (MULTIVITAMIN) tablet Take 1 tablet by mouth daily.   mupirocin ointment (BACTROBAN) 2 % Apply 1 application topically daily. Left Great Toe   pantoprazole (PROTONIX) 40 MG tablet Take 1 tablet (40 mg total) by mouth daily.   tamsulosin (FLOMAX) 0.4 MG CAPS capsule Take 1 capsule (0.4 mg total) by mouth daily.   triamcinolone cream (KENALOG) 0.5 % APPLY TO THE AFFECTED AREA(S) THREE TIMES DAILY   vitamin B-12 (CYANOCOBALAMIN) 500 MCG tablet Take 500 mcg by mouth daily.    Rivaroxaban (XARELTO) 15 MG TABS tablet Take 1 tablet (15 mg total) by mouth daily with supper.   No facility-administered encounter medications on file as of 07/20/2021.    Past Surgical History:  Procedure Laterality Date   ABDOMINAL AORTOGRAM W/LOWER EXTREMITY N/A 04/30/2021   Procedure: ABDOMINAL AORTOGRAM W/LOWER EXTREMITY;  Surgeon: Cherre Robins, MD;  Location: [place] [facility];  Service: Cardiovascular;  Laterality: N/A;   AMPUTATION TOE     AMPUTATION TOE Left 05/04/2021   Procedure: LEFT GREAT TOE AMPUTATION;  Surgeon: Marty Heck, MD;  Location: [place];  Service: Vascular;  Laterality: Left;   BYPASS GRAFT FEMORAL-PERONEAL Left 05/04/2021   Procedure: LEFT COMMON FEMORAL-TIBIOPERONEAL TRUNK BYPASS;  Surgeon: Marty Heck, MD;  Location: [place];  Service: Vascular;  Laterality: Left;   ENDARTERECTOMY FEMORAL Left 05/04/2021   Procedure: LEFT COMMON FEMORAL ENDARTERECTOMY WITH BOVINE PATCH;  Surgeon: Marty Heck, MD;  Location: [place];  Service: Vascular;  Laterality: Left;   ENDARTERECTOMY TIBIOPERONEAL Left 05/04/2021   Procedure: LEFT BELOW KNEE POPITEAL WITH  TIBIOPERONEAL ENDARTERECTOMY;  Surgeon: Marty Heck, MD;  Location: [place];  Service: Vascular;  Laterality: Left;   ENDOVEIN HARVEST OF GREATER SAPHENOUS VEIN  05/04/2021   Procedure: HARVEST OF LEFT GREATER SAPHENOUS VEIN;  Surgeon: Marty Heck, MD;  Location: [place];  Service: Vascular;;   INSERTION OF ILIAC STENT Left 05/04/2021   Procedure: LEFT COMMON ILLIAC ATERY ANGIOPLASTY WITH STENT;  Surgeon: Marty Heck, MD;  Location: [place];  Service: Vascular;  Laterality: Left;   INTRAOPERATIVE ARTERIOGRAM Left 05/04/2021   Procedure: LEFT ARTERY ARTERIOGRAM;  Surgeon: Marty Heck, MD;  Location: [place];  Service: Vascular;  Laterality: Left;   IRRIGATION AND DEBRIDEMENT FOOT Left 05/04/2021   Procedure: IRRIGATION AND DEBRIDEMENT FOOT;  Surgeon: Marty Heck, MD;   Location: [place];  Service: Vascular;  Laterality: Left;   KNEE SURGERY Left    NOSE SURGERY     TEE WITHOUT CARDIOVERSION N/A 05/01/2021   Procedure: TRANSESOPHAGEAL ECHOCARDIOGRAM (TEE);  Surgeon: Skeet Latch, MD;  Location: [facility] ENDOSCOPY;  Service: Cardiovascular;  Laterality: N/A;    Family History  Problem Relation Age of Onset   Stroke Mother    Heart disease Mother        CABG   Cancer Father     New complaints: None today  Social history: Lives by hisself  Controlled substance contract: n/a     Review of Systems  Constitutional:  Negative for diaphoresis.  Eyes:  Negative for pain.  Respiratory:  Negative for shortness of breath.   Cardiovascular:  Negative for chest pain, palpitations and leg swelling.  Gastrointestinal:  Negative for abdominal pain.  Endocrine: Negative for polydipsia.  Skin:  Negative for rash.  Neurological:  Negative for dizziness, weakness and headaches.  Hematological:  Does not  bruise/bleed easily.  All other systems reviewed and are negative.     Objective:   Physical Exam Vitals and nursing note reviewed.  Constitutional:      Appearance: Normal appearance. He is well-developed.  HENT:     Head: Normocephalic.     Nose: Nose normal.  Eyes:     Pupils: Pupils are equal, round, and reactive to light.  Neck:     Thyroid: No thyroid mass or thyromegaly.     Vascular: No carotid bruit or JVD.     Trachea: Phonation normal.  Cardiovascular:     Rate and Rhythm: Normal rate. Rhythm irregular.  Pulmonary:     Effort: Pulmonary effort is normal. No respiratory distress.     Breath sounds: Normal breath sounds.  Abdominal:     General: Bowel sounds are normal.     Palpations: Abdomen is soft.     Tenderness: There is no abdominal tenderness.  Musculoskeletal:        General: Normal range of motion.     Cervical back: Normal range of motion and neck supple.     Comments: He has a bandage on his left foot that cannot be  removed until he follow up with surgeon.  Lymphadenopathy:     Cervical: No cervical adenopathy.  Skin:    General: Skin is warm and dry.  Neurological:     Mental Status: He is alert and oriented to person, place, and time.  Psychiatric:        Behavior: Behavior normal.        Thought Content: Thought content normal.        Judgment: Judgment normal.      BP 123/74   Pulse 95   Temp 97.7 F (36.5 C) (Temporal)   Resp 20   Ht 6' (1.829 m)   Wt 210 lb (95.3 kg)   SpO2 96%   BMI 28.48 kg/m      Assessment & Plan:  Todd Zhang comes in today with chief complaint of Medical Management of Chronic Issues   Diagnosis and orders addressed:  1. Essential hypertension Low sodium diet  2. Peripheral artery disease (HCC) - Rivaroxaban (XARELTO) 15 MG TABS tablet; Take 1 tablet (15 mg total) by mouth daily with supper.  Dispense: 90 tablet; Refill: 1  3. Atrial fibrillation with RVR (HCC) Avoid caffeine Report heart racing to cardiology - metoprolol succinate (TOPROL-XL) 25 MG 24 hr tablet; Take 1 tablet (25 mg total) by mouth daily.  Dispense: 90 tablet; Refill: 1 - Rivaroxaban (XARELTO) 15 MG TABS tablet; Take 1 tablet (15 mg total) by mouth daily with supper.  Dispense: 90 tablet; Refill: 1  4. COPD exacerbation ([place]) If developed coaugh let us know  5. Gastroesophageal reflux disease without esophagitis Avoid spicy foods Do not eat 2 hours prior to bedtime  - pantoprazole (PROTONIX) 40 MG tablet; Take 1 tablet (40 mg total) by mouth daily.  Dispense: 90 tablet; Refill: 1  6. Type 2 diabetes mellitus with diabetic neuropathy, with long-term current use of insulin (HCC) Watch carbs on diet - metFORMIN (GLUCOPHAGE) 500 MG tablet; Take 1 tablet (500 mg total) by mouth 2 (two) times daily.  Dispense: 180 tablet; Refill: 1 - insulin regular (HUMULIN R) 100 units/mL injection; INJECT 10 UNITS INTO THE SKIN THREE TIMES DAILY BEFORE MEALS  Dispense: 30 mL; Refill: 1  7.  DDD (degenerative disc disease), cervical Moist heat No heavy lifting - gabapentin (NEURONTIN) 300 MG capsule; Take 1  capsule (300 mg total) by mouth 4 (four) times daily. TAKE 1 CAPSULE BY MOUTH THREE TIMES DAILY FOR PAIN  Dispense: 120 capsule; Refill: 5  8. Benign prostatic hyperplasia with incomplete bladder emptying Doe snot want bto see urology right now - tamsulosin (FLOMAX) 0.4 MG CAPS capsule; Take 1 capsule (0.4 mg total) by mouth daily.  Dispense: 90 capsule; Refill: 1  9. Stage 3b chronic kidney disease (HCC) Lab results discussed  10. Pure hypercholesterolemia Low fat diet - atorvastatin (LIPITOR) 40 MG tablet; Take 1 tablet (40 mg total) by mouth daily.  Dispense: 90 tablet; Refill: 1 - fenofibrate (TRICOR) 145 MG tablet; Take 1 tablet (145 mg total) by mouth daily.  Dispense: 90 tablet; Refill: 1  11. Chronic low back pain without sciatica, unspecified back pain laterality - gabapentin (NEURONTIN) 300 MG capsule; Take 1 capsule (300 mg total) by mouth 4 (four) times daily. TAKE 1 CAPSULE BY MOUTH THREE TIMES DAILY FOR PAIN  Dispense: 120 capsule; Refill: 5  12. BMI 28.0-28.9,adult Discussed diet and exercise for person with BMI >25 Will recheck weight in 3-6 months    Labs pending Health Maintenance reviewed Diet and exercise encouraged  Follow up plan: 3 months   [name] Daphine Deutscher, FNP

## 2021-07-20 NOTE — Telephone Encounter (Signed)
Left detailed message on pharmacy voicemail to fill what patient has been getting in the past. Advised to contact the office with any further questions.

## 2021-07-20 NOTE — Patient Instructions (Signed)

## 2021-08-04 ENCOUNTER — Telehealth: Payer: Self-pay | Admitting: *Deleted

## 2021-08-04 ENCOUNTER — Telehealth: Payer: Medicare Other | Admitting: *Deleted

## 2021-08-04 NOTE — Telephone Encounter (Signed)
  Care Management   Follow Up Note   08/04/2021 Name: Todd Zhang MRN: [id number] DOB: December 10, 1945   Referred by: [name], FNP Reason for referral : Chronic Care Management (Unsuccessful outreach)   An unsuccessful telephone outreach was attempted today. The patient was referred to the case management team for assistance with care management and care coordination.   Follow Up Plan: A HIPPA compliant phone message was left for the patient providing contact information and requesting a return call. Forwarding to [facility] Care Guides for outreach and rescheduling.  [name], BSN, RN-BC Embedded Chronic Care Manager [facility] Care Management Direct Dial: [phone]

## 2021-08-11 ENCOUNTER — Ambulatory Visit (INDEPENDENT_AMBULATORY_CARE_PROVIDER_SITE_OTHER)
Admission: RE | Admit: 2021-08-11 | Discharge: 2021-08-11 | Disposition: A | Discharge status: Home / Self Care | Payer: Medicare Other | Source: Ambulatory Visit | Attending: Vascular Surgery | Admitting: Vascular Surgery

## 2021-08-11 ENCOUNTER — Encounter: Payer: Self-pay | Admitting: Vascular Surgery

## 2021-08-11 ENCOUNTER — Other Ambulatory Visit: Payer: Self-pay

## 2021-08-11 ENCOUNTER — Ambulatory Visit (HOSPITAL_COMMUNITY)
Admission: RE | Admit: 2021-08-11 | Discharge: 2021-08-11 | Disposition: A | Discharge status: Home / Self Care | Payer: Medicare Other | Source: Ambulatory Visit | Attending: Vascular Surgery | Admitting: Vascular Surgery

## 2021-08-11 ENCOUNTER — Ambulatory Visit (INDEPENDENT_AMBULATORY_CARE_PROVIDER_SITE_OTHER): Payer: Medicare Other | Admitting: Vascular Surgery

## 2021-08-11 VITALS — BP 147/82 | HR 91 | Temp 97.8°F | Resp 18 | Ht 72.0 in | Wt 211.5 lb

## 2021-08-11 DIAGNOSIS — I739 Peripheral vascular disease, unspecified: Secondary | ICD-10-CM

## 2021-08-11 NOTE — Progress Notes (Signed)
Patient name: Todd Zhang MRN: [id number] DOB: [birth year] Sex: male  REASON FOR VISIT: 1 month wound check  HPI: Todd Zhang is a 75 y.o. male with multiple medical comorbidities that presents for 1 month wound check.  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  On last follow-up he had a palpable pulse at the ankle but still had an open wound at the ray amputation site.  We discussed wound clinic but he deferred and wanted to do wound care on his own.  He has been doing Midwife with Santyl.  No new concerns today.  Did get noninvasive studies.  Past Medical History:  Diagnosis Date   Atrial fibrillation ([place])    Cervical spinal stenosis    DDD (degenerative disc disease), cervical    DDD (degenerative disc disease), lumbar    Diabetes mellitus without complication ([place])    Hyperlipidemia    Hypertension    Peripheral artery disease ([place])     Past Surgical History:  Procedure Laterality Date   ABDOMINAL AORTOGRAM W/LOWER EXTREMITY N/A 04/30/2021   Procedure: ABDOMINAL AORTOGRAM W/LOWER EXTREMITY;  Surgeon: Cherre Robins, MD;  Location: [place] [facility];  Service: Cardiovascular;  Laterality: N/A;   AMPUTATION TOE     AMPUTATION TOE Left 05/04/2021   Procedure: LEFT GREAT TOE AMPUTATION;  Surgeon: Marty Heck, MD;  Location: [place];  Service: Vascular;  Laterality: Left;   BYPASS GRAFT FEMORAL-PERONEAL Left 05/04/2021   Procedure: LEFT COMMON FEMORAL-TIBIOPERONEAL TRUNK BYPASS;  Surgeon: Marty Heck, MD;  Location: [place];  Service: Vascular;  Laterality: Left;   ENDARTERECTOMY FEMORAL Left 05/04/2021   Procedure: LEFT COMMON FEMORAL ENDARTERECTOMY WITH BOVINE PATCH;  Surgeon: Marty Heck, MD;  Location: [place];  Service: Vascular;  Laterality: Left;    ENDARTERECTOMY TIBIOPERONEAL Left 05/04/2021   Procedure: LEFT BELOW KNEE POPITEAL WITH  TIBIOPERONEAL ENDARTERECTOMY;  Surgeon: Marty Heck, MD;  Location: [place];  Service: Vascular;  Laterality: Left;   ENDOVEIN HARVEST OF GREATER SAPHENOUS VEIN  05/04/2021   Procedure: HARVEST OF LEFT GREATER SAPHENOUS VEIN;  Surgeon: Marty Heck, MD;  Location: [place];  Service: Vascular;;   INSERTION OF ILIAC STENT Left 05/04/2021   Procedure: LEFT COMMON ILLIAC ATERY ANGIOPLASTY WITH STENT;  Surgeon: Marty Heck, MD;  Location: [place];  Service: Vascular;  Laterality: Left;   INTRAOPERATIVE ARTERIOGRAM Left 05/04/2021   Procedure: LEFT ARTERY ARTERIOGRAM;  Surgeon: Marty Heck, MD;  Location: [place];  Service: Vascular;  Laterality: Left;   IRRIGATION AND DEBRIDEMENT FOOT Left 05/04/2021   Procedure: IRRIGATION AND DEBRIDEMENT FOOT;  Surgeon: Marty Heck, MD;  Location: [place];  Service: Vascular;  Laterality: Left;   KNEE SURGERY Left    NOSE SURGERY     TEE WITHOUT CARDIOVERSION N/A 05/01/2021   Procedure: TRANSESOPHAGEAL ECHOCARDIOGRAM (TEE);  Surgeon: Skeet Latch, MD;  Location: [facility] ENDOSCOPY;  Service: Cardiovascular;  Laterality: N/A;    Family History  Problem Relation Age of Onset   Stroke Mother    Heart disease Mother        CABG   Cancer Father     SOCIAL HISTORY: Social History   Tobacco Use   Smoking status: Former    Packs/day: 1.00    Types: Cigarettes  Start date: 06/19/1966    Quit date: 06/20/2011    Years since quitting: 10.1   Smokeless tobacco: Never  Substance Use Topics   Alcohol use: No    Comment: quit drinking in 1982    Allergies  Allergen Reactions   Asa [Aspirin] Hives   Penicillins Hives    Hives + vomiting as an adult  - tolerated Ceftriaxone 04/2021    Current Outpatient Medications  Medication Sig Dispense Refill   alendronate (FOSAMAX) 70 MG tablet TAKE 1 TABLET BY MOUTH ONCE A WEEK. TAKE WITH A FULL GLASS  OF WATER ON AN EMPTY STOMACH 12 tablet 1   atorvastatin (LIPITOR) 40 MG tablet Take 1 tablet (40 mg total) by mouth daily. 90 tablet 1   benazepril (LOTENSIN) 5 MG tablet Take 1 tablet (5 mg total) by mouth daily. 90 tablet 1   cetirizine (ZYRTEC) 10 MG tablet Take 10 mg by mouth daily.      clopidogrel (PLAVIX) 75 MG tablet Take 1 tablet (75 mg total) by mouth daily. 90 tablet 1   collagenase (SANTYL) ointment Apply 1 application topically daily. 30 g 1   fenofibrate (TRICOR) 145 MG tablet Take 1 tablet (145 mg total) by mouth daily. 90 tablet 1   gabapentin (NEURONTIN) 300 MG capsule Take 1 capsule (300 mg total) by mouth 4 (four) times daily. TAKE 1 CAPSULE BY MOUTH THREE TIMES DAILY FOR PAIN 120 capsule 5   glucose blood (ACCU-CHEK AVIVA PLUS) test strip CHECK BLOOD SUGAR FOUR TIMES DAILY Dx E11.40 400 strip 3   insulin regular (HUMULIN R) 100 units/mL injection INJECT 10 UNITS INTO THE SKIN THREE TIMES DAILY BEFORE MEALS 30 mL 1   Insulin Syringe-Needle U-100 (GLOBAL INJECT EASE INSULIN SYR) 31G X 5/16" 0.3 ML MISC USE WITH INSULIN EVERY DAY Dx E11.40 100 each 3   metFORMIN (GLUCOPHAGE) 500 MG tablet Take 1 tablet (500 mg total) by mouth 2 (two) times daily. 180 tablet 1   metoprolol succinate (TOPROL-XL) 25 MG 24 hr tablet Take 1 tablet (25 mg total) by mouth daily. 90 tablet 1   Multiple Vitamin (MULTIVITAMIN) tablet Take 1 tablet by mouth daily.     mupirocin ointment (BACTROBAN) 2 % Apply 1 application topically daily. Left Great Toe     pantoprazole (PROTONIX) 40 MG tablet Take 1 tablet (40 mg total) by mouth daily. 90 tablet 1   Rivaroxaban (XARELTO) 15 MG TABS tablet Take 1 tablet (15 mg total) by mouth daily with supper. 90 tablet 1   tamsulosin (FLOMAX) 0.4 MG CAPS capsule Take 1 capsule (0.4 mg total) by mouth daily. 90 capsule 1   triamcinolone cream (KENALOG) 0.5 % APPLY TO THE AFFECTED AREA(S) THREE TIMES DAILY     vitamin B-12 (CYANOCOBALAMIN) 500 MCG tablet Take 500 mcg by  mouth daily.     No current facility-administered medications for this visit.    REVIEW OF SYSTEMS:  [X]  denotes positive finding, [ ]  denotes negative finding Cardiac  Comments:  Chest pain or chest pressure:    Shortness of breath upon exertion:    Short of breath when lying flat:    Irregular heart rhythm:        Vascular    Pain in calf, thigh, or hip brought on by ambulation:    Pain in feet at night that wakes you up from your sleep:     Blood clot in your veins:    Leg swelling:  Pulmonary    Oxygen at home:    Productive cough:     Wheezing:         Neurologic    Sudden weakness in arms or legs:     Sudden numbness in arms or legs:     Sudden onset of difficulty speaking or slurred speech:    Temporary loss of vision in one eye:     Problems with dizziness:         Gastrointestinal    Blood in stool:     Vomited blood:         Genitourinary    Burning when urinating:     Blood in urine:        Psychiatric    Major depression:         Hematologic    Bleeding problems:    Problems with blood clotting too easily:        Skin    Rashes or ulcers:        Constitutional    Fever or chills:      PHYSICAL EXAM: Vitals:   08/11/21 1037  BP: (!) 147/82  Pulse: 91  Resp: 18  Temp: 97.8 F (36.6 C)  TempSrc: Temporal  SpO2: 97%  Weight: 211 lb 8 oz (95.9 kg)  Height: 6' (1.829 m)    GENERAL: The patient is a well-nourished male, in no acute distress. The vital signs are documented above. CARDIAC: There is a regular rate and rhythm.  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  His open ray amputation has healed nicely over the last month with Dial soap and Santyl.  I discussed he can stop the Santyl and just do Dial soap to keep it clean.  His noninvasive imaging today shows widely patent iliac stent with a patent bypass.  He has a palpable PT pulse at the ankle.  Very pleased with his progress.  We will arrange follow-up in 6 months with noninvasive imaging in the [facility] clinic.   [name], MD [facility] Office: [phone]

## 2021-08-14 ENCOUNTER — Other Ambulatory Visit: Payer: Self-pay

## 2021-08-14 DIAGNOSIS — I739 Peripheral vascular disease, unspecified: Secondary | ICD-10-CM

## 2021-08-17 ENCOUNTER — Other Ambulatory Visit: Payer: Self-pay

## 2021-08-17 ENCOUNTER — Ambulatory Visit (INDEPENDENT_AMBULATORY_CARE_PROVIDER_SITE_OTHER): Payer: Medicare Other

## 2021-08-17 DIAGNOSIS — I129 Hypertensive chronic kidney disease with stage 1 through stage 4 chronic kidney disease, or unspecified chronic kidney disease: Secondary | ICD-10-CM

## 2021-08-17 DIAGNOSIS — Z7902 Long term (current) use of antithrombotics/antiplatelets: Secondary | ICD-10-CM

## 2021-08-17 DIAGNOSIS — Z4781 Encounter for orthopedic aftercare following surgical amputation: Secondary | ICD-10-CM

## 2021-08-17 DIAGNOSIS — E785 Hyperlipidemia, unspecified: Secondary | ICD-10-CM

## 2021-08-17 DIAGNOSIS — Z87891 Personal history of nicotine dependence: Secondary | ICD-10-CM

## 2021-08-17 DIAGNOSIS — Z89412 Acquired absence of left great toe: Secondary | ICD-10-CM | POA: Diagnosis not present

## 2021-08-17 DIAGNOSIS — E1142 Type 2 diabetes mellitus with diabetic polyneuropathy: Secondary | ICD-10-CM

## 2021-08-17 DIAGNOSIS — Z95828 Presence of other vascular implants and grafts: Secondary | ICD-10-CM

## 2021-08-17 DIAGNOSIS — Z794 Long term (current) use of insulin: Secondary | ICD-10-CM

## 2021-08-17 DIAGNOSIS — E1151 Type 2 diabetes mellitus with diabetic peripheral angiopathy without gangrene: Secondary | ICD-10-CM | POA: Diagnosis not present

## 2021-08-17 DIAGNOSIS — K219 Gastro-esophageal reflux disease without esophagitis: Secondary | ICD-10-CM

## 2021-08-17 DIAGNOSIS — Z7901 Long term (current) use of anticoagulants: Secondary | ICD-10-CM

## 2021-08-17 DIAGNOSIS — N1832 Chronic kidney disease, stage 3b: Secondary | ICD-10-CM

## 2021-08-17 DIAGNOSIS — Z955 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: Secondary | ICD-10-CM

## 2021-08-17 DIAGNOSIS — J449 Chronic obstructive pulmonary disease, unspecified: Secondary | ICD-10-CM

## 2021-08-19 ENCOUNTER — Other Ambulatory Visit: Payer: Self-pay | Admitting: Vascular Surgery

## 2021-09-09 ENCOUNTER — Ambulatory Visit (INDEPENDENT_AMBULATORY_CARE_PROVIDER_SITE_OTHER): Payer: Medicare Other | Admitting: Licensed Clinical Social Worker

## 2021-09-09 DIAGNOSIS — I4891 Unspecified atrial fibrillation: Secondary | ICD-10-CM

## 2021-09-09 DIAGNOSIS — I739 Peripheral vascular disease, unspecified: Secondary | ICD-10-CM

## 2021-09-09 DIAGNOSIS — I10 Essential (primary) hypertension: Secondary | ICD-10-CM

## 2021-09-09 DIAGNOSIS — E1140 Type 2 diabetes mellitus with diabetic neuropathy, unspecified: Secondary | ICD-10-CM

## 2021-09-09 DIAGNOSIS — J441 Chronic obstructive pulmonary disease with (acute) exacerbation: Secondary | ICD-10-CM

## 2021-09-09 DIAGNOSIS — F411 Generalized anxiety disorder: Secondary | ICD-10-CM

## 2021-09-09 DIAGNOSIS — Z794 Long term (current) use of insulin: Secondary | ICD-10-CM

## 2021-09-09 NOTE — Patient Instructions (Addendum)
Visit Information  Patient Goals:  Protect My Health (Patient). Manage housing needs of  client.  Client to talk with [facility] nursing staff about breathing challenges of client. Manage mobility needs  Timeframe:  Short-Term Goal Priority:  Medium Progress:  On Track Start Date:         09/09/21               Expected End Date:       12/07/21            Follow Up Date  11/09/21 at 10:00 AM   Protect My Health (Patient)  Manage housing needs of client . Client to talk with [facility] nursing staff about breathing challenges of client. Manage mobility needs   Why is this important?   Screening tests can find diseases early when they are easier to treat.  Your doctor or nurse will talk with you about which tests are important for you.  Getting shots for common diseases like the flu and shingles will help prevent them.     Patient Coping Skills: Attends scheduled medical appointments No transport needs Completes ADLs daily  Patient deficits:   Mobility issues Housing needs  Patient Goals:   Patient will call RNCM or LCSW as needed in next 30 days for CCM support Patient will attend all scheduled client medical appointments in next 30 days Patient will communicate regularly with his brother, Riley Lam, in next 30 days to discuss ongoing needs of client -  Follow Up Plan: LCSW to call client or his sister, [name], on 11/09/21 at 10:00 AM to assess client needs at that time    Kelton Pillar.Burns Timson MSW, LCSW Licensed Clinical Social Worker [facility] Care Management [phone]

## 2021-09-09 NOTE — Chronic Care Management (AMB) (Signed)
Chronic Care Management    Clinical Social Work Note  09/09/2021 Name: Todd Zhang MRN: [id number] DOB: 07-15-1946  Todd Zhang is a 75 y.o. year old male who is a primary care patient of Todd Pierini, FNP. The CCM team was consulted to assist the patient with chronic disease management and/or care coordination needs related to: [name] .   Engaged with patient by telephone for follow up visit in response to provider referral for social work chronic care management and care coordination services.   Consent to Services:  The patient was given information about Chronic Care Management services, agreed to services, and gave verbal consent prior to initiation of services.  Please see initial visit note for detailed documentation.   Patient agreed to services and consent obtained.   Assessment: Review of patient past medical history, allergies, medications, and health status, including review of relevant consultants reports was performed today as part of a comprehensive evaluation and provision of chronic care management and care coordination services.     SDOH (Social Determinants of Health) assessments and interventions performed:  SDOH Interventions    Flowsheet Row Most Recent Value  SDOH Interventions   Physical Activity Interventions Other (Comments)  [walking challenges.  uses a cane to help him walk]  Stress Interventions Provide Counseling  [client has stress related to managing medical needs]  Depression Interventions/Treatment  --  [informed client of LCSW support and of RNCM support]        Advanced Directives Status: See Vynca application for related entries.  CCM Care Plan  Allergies  Allergen Reactions   Asa [Aspirin] Hives   Penicillins Hives    Hives + vomiting as an adult  - tolerated Ceftriaxone 04/2021    Outpatient Encounter Medications as of 09/09/2021  Medication Sig   alendronate (FOSAMAX) 70 MG tablet TAKE 1 TABLET BY MOUTH ONCE  A WEEK. TAKE WITH A FULL GLASS OF WATER ON AN EMPTY STOMACH   atorvastatin (LIPITOR) 40 MG tablet Take 1 tablet (40 mg total) by mouth daily.   benazepril (LOTENSIN) 5 MG tablet Take 1 tablet (5 mg total) by mouth daily.   cetirizine (ZYRTEC) 10 MG tablet Take 10 mg by mouth daily.    clopidogrel (PLAVIX) 75 MG tablet Take 1 tablet (75 mg total) by mouth daily.   collagenase (SANTYL) ointment Apply 1 application topically daily.   fenofibrate (TRICOR) 145 MG tablet Take 1 tablet (145 mg total) by mouth daily.   gabapentin (NEURONTIN) 300 MG capsule Take 1 capsule (300 mg total) by mouth 4 (four) times daily. TAKE 1 CAPSULE BY MOUTH THREE TIMES DAILY FOR PAIN   glucose blood (ACCU-CHEK AVIVA PLUS) test strip CHECK BLOOD SUGAR FOUR TIMES DAILY Dx E11.40   insulin regular (HUMULIN R) 100 units/mL injection INJECT 10 UNITS INTO THE SKIN THREE TIMES DAILY BEFORE MEALS   Insulin Syringe-Needle U-100 (GLOBAL INJECT EASE INSULIN SYR) 31G X 5/16" 0.3 ML MISC USE WITH INSULIN EVERY DAY Dx E11.40   metFORMIN (GLUCOPHAGE) 500 MG tablet Take 1 tablet (500 mg total) by mouth 2 (two) times daily.   metoprolol succinate (TOPROL-XL) 25 MG 24 hr tablet Take 1 tablet (25 mg total) by mouth daily.   Multiple Vitamin (MULTIVITAMIN) tablet Take 1 tablet by mouth daily.   mupirocin ointment (BACTROBAN) 2 % Apply 1 application topically daily. Left Great Toe   pantoprazole (PROTONIX) 40 MG tablet Take 1 tablet (40 mg total) by mouth daily.   Rivaroxaban (XARELTO) 15 MG TABS  tablet Take 1 tablet (15 mg total) by mouth daily with supper.   tamsulosin (FLOMAX) 0.4 MG CAPS capsule Take 1 capsule (0.4 mg total) by mouth daily.   triamcinolone cream (KENALOG) 0.5 % APPLY TO THE AFFECTED AREA(S) THREE TIMES DAILY   vitamin B-12 (CYANOCOBALAMIN) 500 MCG tablet Take 500 mcg by mouth daily.   No facility-administered encounter medications on file as of 09/09/2021.    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 09/13/2020   Pure hypercholesterolemia 07/16/2020   Peripheral artery disease ([place])    COPD exacerbation ([place]) 05/14/2019   DDD (degenerative disc disease), cervical 11/06/2018   Generalized anxiety disorder 11/29/2017   Benign prostatic hyperplasia with incomplete bladder emptying 11/28/2017   Gastroesophageal reflux disease without esophagitis 09/07/2017   Essential hypertension 09/01/2017   Onychomycosis 09/01/2017   History of cardioversion 08/28/2015   Type 2 diabetes mellitus with diabetic neuropathy, with long-term current use of insulin ([place]) 06/19/2014   Back pain 06/19/2014   Neck pain 06/19/2014    Conditions to be addressed/monitored: Monitor housing needs of client. Monitor client completion of ADLs. Monitor client mobility issues  Care Plan : LCSW Care plan  Updates made by Katha Cabal, LCSW since 09/09/2021 12:00 AM     Problem: Coping Skills (General Plan of Care)      Goal: Coping Skills Enhanced: manage housing needs of client; manage home care needs   Start Date: 09/09/2021  Expected End Date: 12/07/2021  This Visit's Progress: On track  Recent Progress: On track  Priority: Medium  Note:   Current barriers:   Patient in need of assistance with connecting to community resources for possible help with housing needs of client Patient is unable to independently navigate community resource options without care coordination support Mobility issues and pain  issues Decreased energy Breathing challenges In home care needs  Clinical Goals:   LCSW to call client in next 30 days to discuss housing needs and housing situation of client Client to call RNCM or LCSW as needed in next 30 days for CCM program support LCSW to communicate with client in next 30 days to discuss in home care support for client Client to call RNCM as needed in next 30 days for CCM nursing support  Clinical Interventions:  Collaboration with [name], FNP regarding development and update of comprehensive plan of care as evidenced by provider attestation and co-signature Discussed with client his current needs, including sleeping issues of client, appetite of client, and pain issues of client Discussed with client transport needs of client. 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 He said his sister often helps him in driving for medical appointments in [place], [place] Reviewed ambulation needs of client. Client said he uses a cane or a walker to help him ambulate Discussed recent surgery for client. He said he is recovering well since surgery.  He is caring for surgery site on his foot. He said his foot  is healing well Discussed housing needs. He continues to reside at apartment in complex where his brother also resides.  He enjoys living in close proximity to his brother in the same apartment complex Reviewed pain issues. He spoke of back pain issues and he spoke of leg pain issues. He said that at night when he tries to sleep that leg pain issue may keep him from sleeping Encouraged Eddie Dibbles to call [facility] as needed for CCM nursing support Encouraged [place] to call LCSW as needed for CCM social work support  Patient Coping Skills: Has support from his sister, Nonie Hoyer Has no transport needs Attends scheduled medical appointments  Patient deficits:   Mobility issues Pain issues Depression issues  Patient Goals:   Patient will call RNCM or LCSW  as needed in next 30 days for CCM support Patient will attend all scheduled client medical appointments in next 30 days Patient will communicate regularly with sister, Nonie Hoyer, in next 30 days to discuss ongoing needs of client -  Follow Up Plan: LCSW to call client or his sister, Nonie Hoyer, on 11/09/21 at 10:00 AM to assess client needs at that time      [name].Arieh Bogue MSW, LCSW Licensed Clinical Social Worker [facility] Care Management [phone]

## 2021-09-12 DIAGNOSIS — J441 Chronic obstructive pulmonary disease with (acute) exacerbation: Secondary | ICD-10-CM

## 2021-09-12 DIAGNOSIS — I10 Essential (primary) hypertension: Secondary | ICD-10-CM

## 2021-09-12 DIAGNOSIS — I4891 Unspecified atrial fibrillation: Secondary | ICD-10-CM

## 2021-09-12 DIAGNOSIS — E1140 Type 2 diabetes mellitus with diabetic neuropathy, unspecified: Secondary | ICD-10-CM

## 2021-09-12 DIAGNOSIS — Z794 Long term (current) use of insulin: Secondary | ICD-10-CM

## 2021-09-15 ENCOUNTER — Ambulatory Visit (INDEPENDENT_AMBULATORY_CARE_PROVIDER_SITE_OTHER): Payer: Medicare Other | Admitting: *Deleted

## 2021-09-15 DIAGNOSIS — I10 Essential (primary) hypertension: Secondary | ICD-10-CM

## 2021-09-15 DIAGNOSIS — I739 Peripheral vascular disease, unspecified: Secondary | ICD-10-CM

## 2021-09-15 DIAGNOSIS — E1140 Type 2 diabetes mellitus with diabetic neuropathy, unspecified: Secondary | ICD-10-CM

## 2021-09-21 NOTE — Patient Instructions (Signed)
Visit Information  Patient Goals/Self-Care Activities: Patient will self administer medications as prescribed Patient will attend all scheduled provider appointments Patient will continue to perform ADL's independently Patient will call provider office for new concerns or questions Patient will not use illicit drugs Patient will check and record blood pressure daily and call PCP with any readings outside of recommended range Patient will call RN Care Manager as needed [phone] Patient will check and record blood sugar 3 times daily and call PCP with any readings outside of recommended range Patient will follow-up with vascular surgeon as planned regarding recent partial foot amputation Patient will clean and dress foot wound as instructed  Patient will seek appropriate medical attention for any new or worsening foot symptoms  The patient verbalized understanding of instructions, educational materials, and care plan provided today and declined offer to receive copy of patient instructions, educational materials, and care plan.   Plan:Telephone follow up appointment with care management team member scheduled for:  10/22/21 with RNCM The patient has been provided with contact information for the care management team and has been advised to call with any health related questions or concerns.   Chong Sicilian, BSN, RN-BC Embedded Chronic Care Manager [facility] / [facility] Care Management Direct Dial: [phone]   [place] / [place] Management Direct Dial: [phone]

## 2021-09-21 NOTE — Chronic Care Management (AMB) (Signed)
Chronic Care Management   CCM RN Visit Note  09/15/2021 Name: Todd Zhang MRN: [id number] DOB: Apr 20, 1946  Subjective: Todd Zhang is a 76 y.o. year old male who is a primary care patient of [name], [place]. The care management team was consulted for assistance with disease management and care coordination needs.    Engaged with patient by telephone for follow up visit in response to provider referral for case management and/or care coordination services.   Consent to Services:  The patient was given information about Chronic Care Management services, agreed to services, and gave verbal consent prior to initiation of services.  Please see initial visit note for detailed documentation.   Patient agreed to services and verbal consent obtained.   Assessment: Review of patient past medical history, allergies, medications, health status, including review of consultants reports, laboratory and other test data, was performed as part of comprehensive evaluation and provision of chronic care management services.   SDOH (Social Determinants of Health) assessments and interventions performed:    CCM Care Plan  Allergies  Allergen Reactions   Asa [Aspirin] Hives   Penicillins Hives    Hives + vomiting as an adult  - tolerated Ceftriaxone 04/2021    Outpatient Encounter Medications as of 09/15/2021  Medication Sig   alendronate (FOSAMAX) 70 MG tablet TAKE 1 TABLET BY MOUTH ONCE A WEEK. TAKE WITH A FULL GLASS OF WATER ON AN EMPTY STOMACH   atorvastatin (LIPITOR) 40 MG tablet Take 1 tablet (40 mg total) by mouth daily.   benazepril (LOTENSIN) 5 MG tablet Take 1 tablet (5 mg total) by mouth daily.   cetirizine (ZYRTEC) 10 MG tablet Take 10 mg by mouth daily.    clopidogrel (PLAVIX) 75 MG tablet Take 1 tablet (75 mg total) by mouth daily.   collagenase (SANTYL) ointment Apply 1 application topically daily.   fenofibrate (TRICOR) 145 MG tablet Take 1 tablet (145 mg total) by mouth  daily.   gabapentin (NEURONTIN) 300 MG capsule Take 1 capsule (300 mg total) by mouth 4 (four) times daily. TAKE 1 CAPSULE BY MOUTH THREE TIMES DAILY FOR PAIN   glucose blood (ACCU-CHEK AVIVA PLUS) test strip CHECK BLOOD SUGAR FOUR TIMES DAILY Dx E11.40   insulin regular (HUMULIN R) 100 units/mL injection INJECT 10 UNITS INTO THE SKIN THREE TIMES DAILY BEFORE MEALS   Insulin Syringe-Needle U-100 (GLOBAL INJECT EASE INSULIN SYR) 31G X 5/16" 0.3 ML MISC USE WITH INSULIN EVERY DAY Dx E11.40   metFORMIN (GLUCOPHAGE) 500 MG tablet Take 1 tablet (500 mg total) by mouth 2 (two) times daily.   metoprolol succinate (TOPROL-XL) 25 MG 24 hr tablet Take 1 tablet (25 mg total) by mouth daily.   Multiple Vitamin (MULTIVITAMIN) tablet Take 1 tablet by mouth daily.   mupirocin ointment (BACTROBAN) 2 % Apply 1 application topically daily. Left Great Toe   pantoprazole (PROTONIX) 40 MG tablet Take 1 tablet (40 mg total) by mouth daily.   Rivaroxaban (XARELTO) 15 MG TABS tablet Take 1 tablet (15 mg total) by mouth daily with supper.   tamsulosin (FLOMAX) 0.4 MG CAPS capsule Take 1 capsule (0.4 mg total) by mouth daily.   triamcinolone cream (KENALOG) 0.5 % APPLY TO THE AFFECTED AREA(S) THREE TIMES DAILY   vitamin B-12 (CYANOCOBALAMIN) 500 MCG tablet Take 500 mcg by mouth daily.   No facility-administered encounter medications on file as of 09/15/2021.    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 09/13/2020    Pure hypercholesterolemia 07/16/2020   Peripheral artery disease ([place])    COPD exacerbation ([place]) 05/14/2019   DDD (degenerative disc disease), cervical 11/06/2018   Generalized anxiety disorder 11/29/2017   Benign prostatic hyperplasia with incomplete bladder emptying 11/28/2017   Gastroesophageal reflux disease without esophagitis 09/07/2017   Essential hypertension 09/01/2017   Onychomycosis 09/01/2017   History of cardioversion 08/28/2015   Type 2 diabetes mellitus with diabetic neuropathy, with long-term current use of insulin ([place]) 06/19/2014   Back pain 06/19/2014   Neck pain 06/19/2014    Conditions to be addressed/monitored:HTN and DMII  Care Plan : [facility] Care Plan     Problem: Chronic Disease Management Needs   Priority: High     Long-Range Goal: Care Management and Care Coordination Needs Associated with HTN, DM and COPD   This Visit's Progress: On track  Recent Progress: On track  Priority: High  Note:   Current Barriers:  Care Coordination needs related to Transportation  Chronic Disease Management support and education needs related to HTN, COPD, and DMII Financial Constraints.  Literacy barriers Transportation barriers  RNCM Clinical Goal(s):  Patient will verbalize understanding of plan for management of HTN, COPD, and DMII through collaboration with RN Care manager, provider, and care team.   Interventions: 1:1 collaboration with primary care provider regarding development and update of comprehensive plan of care as evidenced by provider attestation and co-signature Inter-disciplinary care team collaboration (see longitudinal plan of care) Evaluation of current treatment plan related to  self management and patient's adherence to plan as established by provider  SDOH Barriers (Status: Condition stable. Not addressed this visit.)  Patient interviewed and SDOH assessment performed Patient interviewed and appropriate assessments performed Provided patient  with information about [facility] Discussed plans with patient for ongoing care management follow up and provided patient with direct contact information for care management team   COPD: (Status: Goal on Track (progressing): YES.) Discussed the importance of adequate rest and management of fatigue with COPD; Assessed social determinant of health barriers;  Discussed current symptoms and management Discussed mobility and ability to perform ADLs Encouraged to monitor for symptoms of exacerbation    Diabetes:  (Status: Goal on track: YES.) Lab Results  Component Value Date   HGBA1C 6.3 (H) 06/30/2021   HGBA1C 6.2 (H) 06/19/2021   HGBA1C 8.3 (H) 04/24/2021   Lab Results  Component Value Date   LDLCALC 83 06/19/2021   CREATININE 1.62 (H) 06/30/2021  Assessed patient's understanding of A1c goal: <8% Chart reviewed including relevant office notes and lab results Discussed home blood sugar testing and encouraged to continue testing and recording blood sugar 3 times a day Advised to call PCP with any readings outside of recommended range Discussed diet Trying to avoid fried/fatty foods and simple carbs Advised to follow provider's recommendations for cleaning and dressing the wound and to avoid harsh soaps and cleansers. Reasoning provided Questions answered Reviewed upcoming appointments Discussed foot surgery " On 05/04/2021 he underwent left common iliac stenting with  a left common femoral endarterectomy/bovine patch and then a left common femoral to TP trunk bypass with vein and an open ray amputation of the left great toe for a foot abscess.  We have not seen him since discharge and he has missed previous appointments.  States all of his incisions have healed.  He is putting some ointment on his toe amputation but is unclear.  States this was given to him during a recent hospitalization at an outside hospital."   Hypertension: (Status: Goal on track: YES.) Last practice  recorded BP readings:  BP Readings from Last 3 Encounters:  08/11/21 (!) 147/82  07/20/21 123/74  06/30/21 134/72  Reviewed symptoms of hypotension and patient is asymptomatic at this time Encouraged patient to check and record blood pressure daily and to call PCP with any readings outside of recommended range Advised to take blood pressure log to PCP and cardio visits Reviewed upcoming appointments Verified that home health nurse is coming out at least weekly and she checks his blood pressure too Reviewed medications and encouraged compliance  Patient Goals/Self-Care Activities: Patient will self administer medications as prescribed Patient will attend all scheduled provider appointments Patient will continue to perform ADL's independently Patient will call provider office for new concerns or questions Patient will not use illicit drugs Patient will check and record blood pressure daily and call PCP with any readings outside of recommended range Patient will call RN Care Manager as needed [phone] Patient will check and record blood sugar 3 times daily and call PCP with any readings outside of recommended range Patient will follow-up with vascular surgeon as planned regarding recent partial foot amputation Patient will clean and dress foot wound as instructed  Patient will seek appropriate medical attention for any new or worsening foot symptoms  Plan:Telephone follow up appointment with care management team member scheduled for:  10/22/21 with RNCM The patient has been provided with contact information for the care management team and has been advised to call with any health related questions or concerns.   Chong Sicilian, BSN, RN-BC Embedded Chronic Care Manager [facility] / [place] Management Direct Dial: [phone]

## 2021-10-13 DIAGNOSIS — I10 Essential (primary) hypertension: Secondary | ICD-10-CM

## 2021-10-13 DIAGNOSIS — E1140 Type 2 diabetes mellitus with diabetic neuropathy, unspecified: Secondary | ICD-10-CM

## 2021-10-13 DIAGNOSIS — Z794 Long term (current) use of insulin: Secondary | ICD-10-CM

## 2021-10-19 ENCOUNTER — Encounter: Payer: Self-pay | Admitting: Nurse Practitioner

## 2021-10-19 ENCOUNTER — Other Ambulatory Visit: Payer: Self-pay | Admitting: Family Medicine

## 2021-10-19 ENCOUNTER — Ambulatory Visit (INDEPENDENT_AMBULATORY_CARE_PROVIDER_SITE_OTHER): Payer: Medicare Other | Admitting: Nurse Practitioner

## 2021-10-19 DIAGNOSIS — R051 Acute cough: Secondary | ICD-10-CM

## 2021-10-19 DIAGNOSIS — R5081 Fever presenting with conditions classified elsewhere: Secondary | ICD-10-CM

## 2021-10-19 NOTE — Progress Notes (Signed)
Virtual Visit  Note Due to COVID-19 pandemic this visit was conducted virtually. This visit type was conducted due to national recommendations for restrictions regarding the COVID-19 Pandemic (e.g. social distancing, sheltering in place) in an effort to limit this patient's exposure and mitigate transmission in our community. All issues noted in this document were discussed and addressed.  A physical exam was not performed with this format.  I connected with Todd Zhang on 10/19/21 at 11:15 by telephone and verified that I am speaking with the correct person using two identifiers. Todd Zhang is currently located at home and no one is currently with him during visit. The provider, [name] Daphine Deutscher, FNP is located in their office at time of visit.  I discussed the limitations, risks, security and privacy concerns of performing an evaluation and management service by telephone and the availability of in person appointments. I also discussed with the patient that there may be a patient responsible charge related to this service. The patient expressed understanding and agreed to proceed.   History and Present Illness:  URI  This is a new problem. The current episode started in the past 7 days. The problem has been gradually worsening. The maximum temperature recorded prior to his arrival was 101 - 101.9 F. The fever has been present for 1 to 2 days. Associated symptoms include congestion, coughing, headaches and rhinorrhea. Pertinent negatives include no sinus pain or sore throat. He has tried nothing for the symptoms. The treatment provided mild relief. Home covid test negative.    Review of Systems  HENT:  Positive for congestion and rhinorrhea. Negative for sinus pain and sore throat.   Respiratory:  Positive for cough.   Neurological:  Positive for headaches.    Observations/Objective: Alert and oriented- answers all questions appropriately No distress [place]  voice cough  Assessment and Plan: Todd Zhang in today with chief complaint of No chief complaint on file.   1. Fever in other diseases 2. Acute cough 1. Take meds as prescribed 2. Use a cool mist humidifier especially during the winter months and when heat has been humid. 3. Use saline nose sprays frequently 4. Saline irrigations of the nose can be very helpful if done frequently.  * 4X daily for 1 week*  * Use of a nettie pot can be helpful with this. Follow directions with this* 5. Drink plenty of fluids 6. Keep thermostat turn down low 7.For any cough or congestion- delsym 8. For fever or aces or pains- take tylenol appropriate for age and weight.  * for fevers greater than 101 orally you may alternate ibuprofen and tylenol every  3 hours.   To late for flu treatment- has been greater then 48 hours    Follow Up Instructions: prn    I discussed the assessment and treatment plan with the patient. The patient was provided an opportunity to ask questions and all were answered. The patient agreed with the plan and demonstrated an understanding of the instructions.   The patient was advised to call back or seek an in-person evaluation if the symptoms worsen or if the condition fails to improve as anticipated.  The above assessment and management plan was discussed with the patient. The patient verbalized understanding of and has agreed to the management plan. Patient is aware to call the clinic if symptoms persist or worsen. Patient is aware when to return to the clinic for a follow-up visit. Patient educated on when it is appropriate to go  to the emergency department.   Time call ended:  11:28  I provided 13 minutes of  non face-to-face time during this encounter.    [name] Daphine Deutscher, FNP

## 2021-10-19 NOTE — Patient Instructions (Signed)

## 2021-10-20 ENCOUNTER — Ambulatory Visit: Payer: Medicare Other | Admitting: Nurse Practitioner

## 2021-10-22 ENCOUNTER — Ambulatory Visit (INDEPENDENT_AMBULATORY_CARE_PROVIDER_SITE_OTHER): Payer: Medicare Other | Admitting: *Deleted

## 2021-10-22 DIAGNOSIS — E1140 Type 2 diabetes mellitus with diabetic neuropathy, unspecified: Secondary | ICD-10-CM

## 2021-10-22 DIAGNOSIS — I10 Essential (primary) hypertension: Secondary | ICD-10-CM

## 2021-10-22 DIAGNOSIS — Z794 Long term (current) use of insulin: Secondary | ICD-10-CM

## 2021-10-22 NOTE — Chronic Care Management (AMB) (Signed)
Chronic Care Management   CCM RN Visit Note  10/22/2021 Name: Todd Zhang MRN: [id number] DOB: 1945/12/26  Subjective: Todd Zhang is a 76 y.o. year old male who is a primary care patient of [name], [place]. The care management team was consulted for assistance with disease management and care coordination needs.    Engaged with patient by telephone for follow up visit in response to provider referral for case management and/or care coordination services.   Consent to Services:  The patient was given information about Chronic Care Management services, agreed to services, and gave verbal consent prior to initiation of services.  Please see initial visit note for detailed documentation.   Patient agreed to services and verbal consent obtained.   Assessment: Review of patient past medical history, allergies, medications, health status, including review of consultants reports, laboratory and other test data, was performed as part of comprehensive evaluation and provision of chronic care management services.   SDOH (Social Determinants of Health) assessments and interventions performed:    CCM Care Plan  Allergies  Allergen Reactions   Asa [Aspirin] Hives   Penicillins Hives    Hives + vomiting as an adult  - tolerated Ceftriaxone 04/2021    Outpatient Encounter Medications as of 10/22/2021  Medication Sig   alendronate (FOSAMAX) 70 MG tablet TAKE 1 TABLET BY MOUTH ONCE A WEEK. TAKE WITH A FULL GLASS OF WATER ON AN EMPTY STOMACH   atorvastatin (LIPITOR) 40 MG tablet Take 1 tablet (40 mg total) by mouth daily.   benazepril (LOTENSIN) 5 MG tablet Take 1 tablet (5 mg total) by mouth daily.   cetirizine (ZYRTEC) 10 MG tablet Take 10 mg by mouth daily.    clopidogrel (PLAVIX) 75 MG tablet Take 1 tablet (75 mg total) by mouth daily.   collagenase (SANTYL) ointment Apply 1 application topically daily.   fenofibrate (TRICOR) 145 MG tablet Take 1 tablet (145 mg total) by mouth  daily.   gabapentin (NEURONTIN) 300 MG capsule Take 1 capsule (300 mg total) by mouth 4 (four) times daily. TAKE 1 CAPSULE BY MOUTH THREE TIMES DAILY FOR PAIN   glucose blood (ACCU-CHEK AVIVA PLUS) test strip CHECK BLOOD SUGAR FOUR TIMES DAILY Dx E11.40   insulin regular (HUMULIN R) 100 units/mL injection INJECT 10 UNITS INTO THE SKIN THREE TIMES DAILY BEFORE MEALS   Insulin Syringe-Needle U-100 (GLOBAL EASY GLIDE INSULIN SYR) 31G X 5/16" 0.3 ML MISC USE WITH INSULIN EVERY DAY Dx E11.40   metFORMIN (GLUCOPHAGE) 500 MG tablet Take 1 tablet (500 mg total) by mouth 2 (two) times daily.   metoprolol succinate (TOPROL-XL) 25 MG 24 hr tablet Take 1 tablet (25 mg total) by mouth daily.   Multiple Vitamin (MULTIVITAMIN) tablet Take 1 tablet by mouth daily.   mupirocin ointment (BACTROBAN) 2 % Apply 1 application topically daily. Left Great Toe   pantoprazole (PROTONIX) 40 MG tablet Take 1 tablet (40 mg total) by mouth daily.   Rivaroxaban (XARELTO) 15 MG TABS tablet Take 1 tablet (15 mg total) by mouth daily with supper.   tamsulosin (FLOMAX) 0.4 MG CAPS capsule Take 1 capsule (0.4 mg total) by mouth daily.   triamcinolone cream (KENALOG) 0.5 % APPLY TO THE AFFECTED AREA(S) THREE TIMES DAILY   vitamin B-12 (CYANOCOBALAMIN) 500 MCG tablet Take 500 mcg by mouth daily.   No facility-administered encounter medications on file as of 10/22/2021.    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 09/13/2020    Pure hypercholesterolemia 07/16/2020   Peripheral artery disease ([place])    COPD exacerbation ([place]) 05/14/2019   DDD (degenerative disc disease), cervical 11/06/2018   Generalized anxiety disorder 11/29/2017   Benign prostatic hyperplasia with incomplete bladder emptying 11/28/2017   Gastroesophageal reflux disease without esophagitis 09/07/2017   Essential hypertension 09/01/2017   Onychomycosis 09/01/2017   History of cardioversion 08/28/2015   Type 2 diabetes mellitus with diabetic neuropathy, with long-term current use of insulin ([place]) 06/19/2014   Back pain 06/19/2014   Neck pain 06/19/2014    Conditions to be addressed/monitored:HTN, COPD, DMII, and Level of Care Needs  Care Plan : [facility] Care Plan  Updates made by Ilean China, RN since 10/22/2021 12:00 AM     Problem: Chronic Disease Management Needs   Priority: High     Long-Range Goal: Care Management and Care Coordination Needs Associated with HTN, DM and COPD   This Visit's Progress: On track  Recent Progress: On track  Priority: High  Note:   Current Barriers:  Care Coordination needs related to Transportation  Chronic Disease Management support and education needs related to HTN, COPD, and DMII Financial Constraints.  Literacy barriers Transportation barriers  RNCM Clinical Goal(s):  Patient will verbalize understanding of plan for management of HTN, COPD, and DMII through collaboration with RN Care manager, provider, and care team.  Patient will work with CCM team and PCP office regarding application for PCS services  Interventions: 1:1 collaboration with primary care provider regarding development and update of comprehensive plan of care as evidenced by provider attestation and co-signature Inter-disciplinary care team collaboration (see longitudinal plan of care) Evaluation of current treatment plan related to  self management and patient's adherence to plan as established by provider  SDOH Barriers  (Status: Goal on Track (progressing): YES.)  Patient interviewed and SDOH assessment performed Patient interviewed and appropriate assessments performed Provided patient with information about PCS services through [place] with patient for ongoing care management follow up and provided patient with direct contact information for care management team Collaborated with Theadore Nan, LCSW regarding level of care need Patient is able to perform ADLs but tires easily and could use some help around the house with meal prep and clean-up Discussed that patient has stable housing at this time in an apartment complex. He reports that he will be moving to the 1st floor soon. Reviewed upcoming appointment with PCP and added appointment note to discuss PCS application Collaborated with PCP and Clinical Staff regarding PCS application   COPD: (Status: Goal on Track (progressing): YES.) Discussed the importance of adequate rest and management of fatigue with COPD; Assessed social determinant of health barriers;  Discussed current symptoms and management Discussed mobility and ability to perform ADLs Discussed recent URI and that symptoms have resolved Encouraged to monitor for symptoms of exacerbation   Diabetes:  (Status: Goal on track: YES.) Lab Results  Component Value Date   HGBA1C 6.3 (H) 06/30/2021   HGBA1C 6.2 (H) 06/19/2021  HGBA1C 8.3 (H) 04/24/2021   Lab Results  Component Value Date   LDLCALC 83 06/19/2021   CREATININE 1.62 (H) 06/30/2021  Assessed patient's understanding of A1c goal: <8% Chart reviewed including relevant office notes and lab results Discussed home blood sugar testing and encouraged to continue testing and recording blood sugar 3 times a day Patient was going to the pharmacy today to pickup a new glucometer Advised to call PCP with any readings outside of recommended range Discussed diet Trying to avoid fried/fatty foods and simple  carbs Reviewed upcoming appointments Discussed foot care and encouraged to check feet daily and to report any sores, cuts, calluses, etc to PCP Encouraged to wear well fitting socks and shoes Encouraged to follow-up with podiatrist as recommended   Hypertension: (Status: Goal on track: YES.) Last practice recorded BP readings:  BP Readings from Last 3 Encounters:  08/11/21 (!) 147/82  07/20/21 123/74  06/30/21 134/72   Reviewed symptoms of hypotension and patient is asymptomatic at this time Encouraged patient to check and record blood pressure daily and to call PCP with any readings outside of recommended range Advised to take blood pressure log to PCP and cardio visits Reviewed upcoming appointments Reviewed medications and encouraged compliance  Patient Goals/Self-Care Activities: Patient will self administer medications as prescribed Patient will attend all scheduled provider appointments Patient will continue to perform ADL's independently Patient will call provider office for new concerns or questions Patient will not use illicit drugs Patient will check and record blood pressure daily and call PCP with any readings outside of recommended range Patient will call RN Care Manager as needed [phone] Patient will check and record blood sugar 3 times daily and call PCP with any readings outside of recommended range Patient will clean and check feet daily for any cuts, sores, calluses, etc and will call PCP with any concerns Patient will wear clean and well fitting socks and shoes  Patient will work with PCP and CCM team regarding application for [place]  Plan:Telephone follow up appointment with care management team member scheduled for:  10/30/21 with RNCM The patient has been provided with contact information for the care management team and has been advised to call with any health related questions or concerns.   Chong Sicilian, BSN, RN-BC Embedded Chronic  Care Manager [facility] / [place] Management Direct Dial: [phone]

## 2021-10-22 NOTE — Patient Instructions (Signed)
Visit Information  Patient Goals/Self-Care Activities: Patient will self administer medications as prescribed Patient will attend all scheduled provider appointments Patient will continue to perform ADL's independently Patient will call provider office for new concerns or questions Patient will not use illicit drugs Patient will check and record blood pressure daily and call PCP with any readings outside of recommended range Patient will call RN Care Manager as needed [phone] Patient will check and record blood sugar 3 times daily and call PCP with any readings outside of recommended range Patient will clean and check feet daily for any cuts, sores, calluses, etc and will call PCP with any concerns Patient will wear clean and well fitting socks and shoes  Patient will work with PCP and CCM team regarding application for Personal Care Services   The patient verbalized understanding of instructions, educational materials, and care plan provided today and declined offer to receive copy of patient instructions, educational materials, and care plan.   Plan:Telephone follow up appointment with care management team member scheduled for:  10/30/21 with RNCM The patient has been provided with contact information for the care management team and has been advised to call with any health related questions or concerns.    [name], BSN, RN-BC Embedded Chronic Care Manager [facility] / [facility] Care Management Direct Dial: [phone]

## 2021-10-26 ENCOUNTER — Ambulatory Visit: Payer: Medicare Other | Admitting: Nurse Practitioner

## 2021-10-29 ENCOUNTER — Telehealth: Payer: Self-pay

## 2021-10-29 ENCOUNTER — Ambulatory Visit: Payer: Self-pay | Admitting: Licensed Clinical Social Worker

## 2021-10-29 ENCOUNTER — Other Ambulatory Visit: Payer: Self-pay | Admitting: Licensed Clinical Social Worker

## 2021-10-29 DIAGNOSIS — E1140 Type 2 diabetes mellitus with diabetic neuropathy, unspecified: Secondary | ICD-10-CM

## 2021-10-29 DIAGNOSIS — J441 Chronic obstructive pulmonary disease with (acute) exacerbation: Secondary | ICD-10-CM

## 2021-10-29 DIAGNOSIS — Z794 Long term (current) use of insulin: Secondary | ICD-10-CM

## 2021-10-29 DIAGNOSIS — I152 Hypertension secondary to endocrine disorders: Secondary | ICD-10-CM

## 2021-10-29 DIAGNOSIS — E1159 Type 2 diabetes mellitus with other circulatory complications: Secondary | ICD-10-CM

## 2021-10-29 DIAGNOSIS — F411 Generalized anxiety disorder: Secondary | ICD-10-CM

## 2021-10-29 DIAGNOSIS — I4891 Unspecified atrial fibrillation: Secondary | ICD-10-CM

## 2021-10-29 DIAGNOSIS — E1169 Type 2 diabetes mellitus with other specified complication: Secondary | ICD-10-CM

## 2021-10-29 NOTE — Patient Instructions (Addendum)
Visit Information  Patient Visit: Protect My Health . Manage Housing needs. Manage mobility needs  Time Frame:  Short Term Goal Priority:  High Progress:  On Track  Follow Up Date:  11/09/21 at 10:00 AM  Protect My Health. Manage Housing needs. Manage mobility needs  Patient Coping Skills: Has support from his sister, Nonie Hoyer Has no transport needs Attends scheduled medical appointments  Patient deficits:   Mobility issues Pain issues Depression issues  Patient Goals:   Patient will call RNCM or LCSW as needed in next 30 days for CCM support Patient will attend all scheduled client medical appointments in next 30 days Patient will communicate regularly with sister, Nonie Hoyer, in next 30 days to discuss ongoing needs of client -  Follow Up Plan: LCSW to call client or his sister, Nonie Hoyer, on 11/09/21 at 10:00 AM to assess client needs at that time    [name].Damali Broadfoot MSW, [place] representative [facility] Care Management [phone]

## 2021-10-29 NOTE — Telephone Encounter (Signed)
° °  Telephone encounter was:  Successful.  10/29/2021 Name: Todd Zhang MRN: [id number] DOB: June 26, 1946  Todd Zhang is a 76 y.o. year old male who is a primary care patient of [name], FNP . The community resource team was consulted for assistance with Food Insecurity  Care guide performed the following interventions: Patient provided with information about care guide support team and interviewed to confirm resource needs.Patient refused resources but im going to mail them incase he changes his mind. Patient had no intrest in talking to me but I was able to ask a few from SDOH   Follow Up Plan:  No further follow up planned at this time. The patient has been provided with needed resources.    Lenard Forth Care Guide, Embedded Care Coordination [facility], Care Management  [phone] [address] [place], [place], [place] [address] Phone: [phone] Email: [email or web address]

## 2021-10-29 NOTE — Progress Notes (Signed)
°  Chronic Care Management     Clinical Social Work Note   10/29/21 Name: Todd Zhang  MRN: [id number]       DOB: March 18, 1946   Todd Zhang is a 76 y.o. year old male who is a primary care patient of [name], FNP. The CCM team was consulted to assist the patient with chronic disease management and/or care coordination needs related to: [name] .    Received communication from [employer] regarding client food needs.  LCSW placed a referral today to [facility] Care Guides for food resources list for [facility], [place] to be mailed to client as soon as possible.      Consent to Services:  The patient was given information about Chronic Care Management services, agreed to services, and gave verbal consent prior to initiation of services.  Please see initial visit note for detailed documentation.    Patient agreed to services and consent obtained.    Assessment: Review of patient past medical history, allergies, medications, and health status, including review of relevant consultants reports was performed today as part of a comprehensive evaluation and provision of chronic care management and care coordination services.      SDOH (Social Determinants of Health) assessments and interventions performed:  SDOH Interventions     Flowsheet Row Most Recent Value  SDOH Interventions    Physical Activity Interventions Other (Comments)  [walking challenges.  uses a cane to help him walk]  Stress Interventions Provide Counseling  [client has stress related to managing medical needs]  Depression Interventions/Treatment  --  [informed client of LCSW support and of RNCM support]    Kelton Pillar.Zoua Caporaso MSW, LCSW Licensed Visual merchandiser [facility] Care Management [phone]

## 2021-10-29 NOTE — Telephone Encounter (Signed)
° °  Telephone encounter was:  Unsuccessful.  10/29/2021 Name: Todd Zhang MRN: [id number] DOB: Nov 25, 1945 Change address in the letter for mail   [facility] Guide, Embedded Care Coordination [facility], Care Management  [phone] [address] [place], [place], [place] [address] Phone: [phone] Email: [email or web address]

## 2021-10-30 ENCOUNTER — Encounter: Payer: Self-pay | Admitting: Nurse Practitioner

## 2021-10-30 ENCOUNTER — Ambulatory Visit (INDEPENDENT_AMBULATORY_CARE_PROVIDER_SITE_OTHER): Payer: Medicare Other | Admitting: Nurse Practitioner

## 2021-10-30 ENCOUNTER — Ambulatory Visit: Payer: Medicare Other | Admitting: *Deleted

## 2021-10-30 VITALS — BP 120/69 | HR 86 | Temp 98.3°F | Resp 20 | Ht 72.0 in | Wt 211.0 lb

## 2021-10-30 DIAGNOSIS — I739 Peripheral vascular disease, unspecified: Secondary | ICD-10-CM | POA: Diagnosis not present

## 2021-10-30 DIAGNOSIS — I4891 Unspecified atrial fibrillation: Secondary | ICD-10-CM

## 2021-10-30 DIAGNOSIS — I10 Essential (primary) hypertension: Secondary | ICD-10-CM

## 2021-10-30 DIAGNOSIS — M5030 Other cervical disc degeneration, unspecified cervical region: Secondary | ICD-10-CM

## 2021-10-30 DIAGNOSIS — E1140 Type 2 diabetes mellitus with diabetic neuropathy, unspecified: Secondary | ICD-10-CM

## 2021-10-30 DIAGNOSIS — Z794 Long term (current) use of insulin: Secondary | ICD-10-CM

## 2021-10-30 DIAGNOSIS — E7800 Pure hypercholesterolemia, unspecified: Secondary | ICD-10-CM

## 2021-10-30 DIAGNOSIS — K219 Gastro-esophageal reflux disease without esophagitis: Secondary | ICD-10-CM

## 2021-10-30 DIAGNOSIS — M545 Low back pain, unspecified: Secondary | ICD-10-CM

## 2021-10-30 DIAGNOSIS — E1169 Type 2 diabetes mellitus with other specified complication: Secondary | ICD-10-CM

## 2021-10-30 DIAGNOSIS — N1832 Chronic kidney disease, stage 3b: Secondary | ICD-10-CM

## 2021-10-30 DIAGNOSIS — J441 Chronic obstructive pulmonary disease with (acute) exacerbation: Secondary | ICD-10-CM

## 2021-10-30 DIAGNOSIS — R3914 Feeling of incomplete bladder emptying: Secondary | ICD-10-CM

## 2021-10-30 DIAGNOSIS — J411 Mucopurulent chronic bronchitis: Secondary | ICD-10-CM

## 2021-10-30 DIAGNOSIS — G8929 Other chronic pain: Secondary | ICD-10-CM

## 2021-10-30 DIAGNOSIS — F411 Generalized anxiety disorder: Secondary | ICD-10-CM

## 2021-10-30 DIAGNOSIS — N401 Enlarged prostate with lower urinary tract symptoms: Secondary | ICD-10-CM

## 2021-10-30 LAB — BAYER DCA HB A1C WAIVED: HB A1C (BAYER DCA - WAIVED): 7.4 % — ABNORMAL HIGH (ref 4.8–5.6)

## 2021-10-30 MED ORDER — FENOFIBRATE 145 MG PO TABS: 145.0000 mg | ORAL_TABLET | Freq: Every day | ORAL | 1 refills | Status: DC

## 2021-10-30 MED ORDER — METFORMIN HCL 500 MG PO TABS: 500.0000 mg | ORAL_TABLET | Freq: Two times a day (BID) | ORAL | 1 refills | Status: DC

## 2021-10-30 MED ORDER — INSULIN REGULAR HUMAN 100 UNIT/ML IJ SOLN: INTRAMUSCULAR | 1 refills | Status: DC

## 2021-10-30 MED ORDER — ALENDRONATE SODIUM 70 MG PO TABS: ORAL_TABLET | ORAL | 1 refills | Status: DC

## 2021-10-30 MED ORDER — GABAPENTIN 300 MG PO CAPS: 300.0000 mg | ORAL_CAPSULE | Freq: Four times a day (QID) | ORAL | 5 refills | Status: DC

## 2021-10-30 MED ORDER — PANTOPRAZOLE SODIUM 40 MG PO TBEC: 40.0000 mg | DELAYED_RELEASE_TABLET | Freq: Every day | ORAL | 1 refills | Status: DC

## 2021-10-30 MED ORDER — CLOPIDOGREL BISULFATE 75 MG PO TABS: 75.0000 mg | ORAL_TABLET | Freq: Every day | ORAL | 1 refills | Status: DC

## 2021-10-30 MED ORDER — RIVAROXABAN 15 MG PO TABS: 15.0000 mg | ORAL_TABLET | Freq: Every day | ORAL | 1 refills | Status: DC

## 2021-10-30 MED ORDER — TAMSULOSIN HCL 0.4 MG PO CAPS: 0.4000 mg | ORAL_CAPSULE | Freq: Every day | ORAL | 1 refills | Status: DC

## 2021-10-30 MED ORDER — ATORVASTATIN CALCIUM 40 MG PO TABS: 40.0000 mg | ORAL_TABLET | Freq: Every day | ORAL | 1 refills | Status: DC

## 2021-10-30 MED ORDER — BENAZEPRIL HCL 5 MG PO TABS: 5.0000 mg | ORAL_TABLET | Freq: Every day | ORAL | 1 refills | Status: DC

## 2021-10-30 NOTE — Patient Instructions (Signed)

## 2021-10-30 NOTE — Patient Instructions (Signed)
Visit Information  Patient Goals/Self-Care Activities: Patient will self administer medications as prescribed Patient will attend all scheduled provider appointments Patient will continue to perform ADL's independently Patient will call provider office for new concerns or questions Patient will not use illicit drugs Patient will check and record blood pressure daily and call PCP with any readings outside of recommended range Patient will call RN Care Manager as needed [phone] Patient will check and record blood sugar 3 times daily and call PCP with any readings outside of recommended range Patient will clean and check feet daily for any cuts, sores, calluses, etc and will call PCP with any concerns Patient will wear clean and well fitting socks and shoes  Patient will work with PCP and CCM team regarding application for Personal Care Services  The patient verbalized understanding of instructions, educational materials, and care plan provided today and declined offer to receive copy of patient instructions, educational materials, and care plan.   Plan:Telephone follow up appointment with care management team member scheduled for:  11/25/21 with RNCM The patient has been provided with contact information for the care management team and has been advised to call with any health related questions or concerns.   [name], BSN, RN-BC Embedded Chronic Care Manager [facility] / [facility] Care Management Direct Dial: [phone]

## 2021-10-30 NOTE — Chronic Care Management (AMB) (Signed)
Chronic Care Management   CCM RN Visit Note  10/30/2021 Name: Todd Zhang MRN: [id number] DOB: 16-Oct-1945  Subjective: Todd Zhang is a 76 y.o. year old male who is a primary care patient of [name], [place]. The care management team was consulted for assistance with disease management and care coordination needs.    Collaboration with PCP, [facility] Clinical Staff, and Theadore Nan, LCSW  for  care coordination  in response to provider referral for case management and/or care coordination services.   Consent to Services:  The patient was given information about Chronic Care Management services, agreed to services, and gave verbal consent prior to initiation of services.  Please see initial visit note for detailed documentation.   Patient agreed to services and verbal consent obtained.   Assessment: Review of patient past medical history, allergies, medications, health status, including review of consultants reports, laboratory and other test data, was performed as part of comprehensive evaluation and provision of chronic care management services.   SDOH (Social Determinants of Health) assessments and interventions performed:    CCM Care Plan  Allergies  Allergen Reactions   Asa [Aspirin] Hives   Penicillins Hives    Hives + vomiting as an adult  - tolerated Ceftriaxone 04/2021    Outpatient Encounter Medications as of 10/30/2021  Medication Sig   alendronate (FOSAMAX) 70 MG tablet TAKE 1 TABLET BY MOUTH ONCE A WEEK. TAKE WITH A FULL GLASS OF WATER ON AN EMPTY STOMACH   atorvastatin (LIPITOR) 40 MG tablet Take 1 tablet (40 mg total) by mouth daily.   benazepril (LOTENSIN) 5 MG tablet Take 1 tablet (5 mg total) by mouth daily.   cetirizine (ZYRTEC) 10 MG tablet Take 10 mg by mouth daily.  (Patient not taking: Reported on 10/30/2021)   clopidogrel (PLAVIX) 75 MG tablet Take 1 tablet (75 mg total) by mouth daily.   collagenase (SANTYL) ointment Apply 1 application topically  daily.   fenofibrate (TRICOR) 145 MG tablet Take 1 tablet (145 mg total) by mouth daily.   gabapentin (NEURONTIN) 300 MG capsule Take 1 capsule (300 mg total) by mouth 4 (four) times daily. TAKE 1 CAPSULE BY MOUTH THREE TIMES DAILY FOR PAIN   glucose blood (ACCU-CHEK AVIVA PLUS) test strip CHECK BLOOD SUGAR FOUR TIMES DAILY Dx E11.40   insulin regular (HUMULIN R) 100 units/mL injection INJECT 10 UNITS INTO THE SKIN THREE TIMES DAILY BEFORE MEALS   Insulin Syringe-Needle U-100 (GLOBAL EASY GLIDE INSULIN SYR) 31G X 5/16" 0.3 ML MISC USE WITH INSULIN EVERY DAY Dx E11.40   metFORMIN (GLUCOPHAGE) 500 MG tablet Take 1 tablet (500 mg total) by mouth 2 (two) times daily.   metoprolol succinate (TOPROL-XL) 25 MG 24 hr tablet Take 1 tablet (25 mg total) by mouth daily.   Multiple Vitamin (MULTIVITAMIN) tablet Take 1 tablet by mouth daily. (Patient not taking: Reported on 10/30/2021)   mupirocin ointment (BACTROBAN) 2 % Apply 1 application topically daily. Left Great Toe   pantoprazole (PROTONIX) 40 MG tablet Take 1 tablet (40 mg total) by mouth daily.   Rivaroxaban (XARELTO) 15 MG TABS tablet Take 1 tablet (15 mg total) by mouth daily with supper.   tamsulosin (FLOMAX) 0.4 MG CAPS capsule Take 1 capsule (0.4 mg total) by mouth daily.   triamcinolone cream (KENALOG) 0.5 % APPLY TO THE AFFECTED AREA(S) THREE TIMES DAILY   vitamin B-12 (CYANOCOBALAMIN) 500 MCG tablet Take 500 mcg by mouth daily. (Patient not taking: Reported on 10/30/2021)   No facility-administered  encounter medications on file as of 10/30/2021.    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 09/13/2020   Pure hypercholesterolemia 07/16/2020   Peripheral artery disease ([place])    COPD exacerbation ([place]) 05/14/2019   DDD (degenerative disc disease), cervical 11/06/2018   Generalized anxiety disorder 11/29/2017   Benign prostatic hyperplasia with incomplete bladder emptying 11/28/2017   Gastroesophageal reflux disease without esophagitis 09/07/2017   Essential hypertension 09/01/2017   Onychomycosis 09/01/2017   History of cardioversion 08/28/2015   Type 2 diabetes mellitus with diabetic neuropathy, with long-term current use of insulin ([place]) 06/19/2014   Back pain 06/19/2014   Neck pain 06/19/2014    Conditions to be addressed/monitored:HTN, COPD, and DMII  Care Plan : [place]  Updates made by Ilean China, RN since 10/30/2021 12:00 AM     Problem: Chronic Disease Management Needs   Priority: High     Long-Range Goal: Care Management and Care Coordination Needs Associated with HTN, DM and COPD   This Visit's Progress: On track  Recent Progress: On track  Priority: High  Note:   Current Barriers:  Care Coordination needs related to Transportation  Chronic Disease Management support and education needs related to HTN, COPD, and DMII Financial Constraints.  Literacy barriers Transportation barriers  RNCM Clinical Goal(s):  Patient will verbalize understanding of plan for management of HTN, COPD, and DMII through collaboration with RN Care manager, provider, and care team.  Patient will work with CCM team and PCP office regarding application for PCS services  Interventions: 1:1 collaboration with primary care provider regarding development and update of comprehensive plan of care as evidenced by provider attestation and co-signature Inter-disciplinary care team collaboration (see longitudinal plan of care) Evaluation of current treatment plan  related to  self management and patient's adherence to plan as established by provider  SDOH Barriers (Status: Goal on Track (progressing): YES.)  Patient interviewed and SDOH assessment performed Patient interviewed and appropriate assessments performed Provided patient with information about PCS services through [facility] with patient for ongoing care management follow up and provided patient with direct contact information for care management team Collaborated with Theadore Nan, LCSW regarding level of care need Patient is able to perform ADLs but tires easily and could use some help around the house with meal prep and clean-up Discussed that patient has moved down to the first floor apartment Reviewed office visit with PCP  Collaborated with PCP, Theadore Nan, LCSW and Clinical Staff regarding PCS application. LCSW to assist with application completion.   COPD: (Status: Condition stable. Not addressed this visit.) Discussed the importance of adequate rest and management of fatigue with COPD; Assessed social determinant of health barriers;  Discussed current symptoms and management Discussed mobility and ability to perform ADLs Discussed recent URI and that symptoms have resolved Encouraged to monitor for symptoms of exacerbation    Diabetes:  (Status: Condition stable. Not addressed this visit.) Lab Results  Component Value Date   HGBA1C 6.3 (H) 06/30/2021  HGBA1C 6.2 (H) 06/19/2021   HGBA1C 8.3 (H) 04/24/2021   Lab Results  Component Value Date   LDLCALC 83 06/19/2021   CREATININE 1.62 (H) 06/30/2021  Assessed patient's understanding of A1c goal: <8% Chart reviewed including relevant office notes and lab results Discussed home blood sugar testing and encouraged to continue testing and recording blood sugar 3 times a day Patient was going to the pharmacy today to pickup a new glucometer Advised to call PCP with any readings outside of recommended  range Discussed diet Trying to avoid fried/fatty foods and simple carbs Reviewed upcoming appointments Discussed foot care and encouraged to check feet daily and to report any sores, cuts, calluses, etc to PCP Encouraged to wear well fitting socks and shoes Encouraged to follow-up with podiatrist as recommended   Hypertension: (Status: Condition stable. Not addressed this visit.) Last practice recorded BP readings:  BP Readings from Last 3 Encounters:  08/11/21 (!) 147/82  07/20/21 123/74  06/30/21 134/72   Reviewed symptoms of hypotension and patient is asymptomatic at this time Encouraged patient to check and record blood pressure daily and to call PCP with any readings outside of recommended range Advised to take blood pressure log to PCP and cardio visits Reviewed upcoming appointments Reviewed medications and encouraged compliance  Patient Goals/Self-Care Activities: Patient will self administer medications as prescribed Patient will attend all scheduled provider appointments Patient will continue to perform ADL's independently Patient will call provider office for new concerns or questions Patient will not use illicit drugs Patient will check and record blood pressure daily and call PCP with any readings outside of recommended range Patient will call RN Care Manager as needed [phone] Patient will check and record blood sugar 3 times daily and call PCP with any readings outside of recommended range Patient will clean and check feet daily for any cuts, sores, calluses, etc and will call PCP with any concerns Patient will wear clean and well fitting socks and shoes  Patient will work with PCP and CCM team regarding application for [place]   Plan:Telephone follow up appointment with care management team member scheduled for:  11/25/21 with RNCM The patient has been provided with contact information for the care management team and has been advised to call  with any health related questions or concerns.   Chong Sicilian, BSN, RN-BC Embedded Chronic Care Manager [facility] Direct Dial: [phone]

## 2021-10-30 NOTE — Progress Notes (Addendum)
Subjective:    Patient ID: Todd Zhang, male    DOB: February 18, 1946, 76 y.o.   MRN: [id number]  Chief Complaint: medical management of chronic issues     HPI:  Todd Zhang is a 76 y.o. who identifies as a male who was assigned male at birth.   Social history: Lives with: by hisself Work history: retired   Scientist, forensic in today for follow up of the following chronic medical issues:  1. Essential hypertension No c/o chest p-ain, sob or headache. Does not check blood pressure at home. BP Readings from Last 3 Encounters:  08/11/21 (!) 147/82  07/20/21 123/74  06/30/21 134/72     2. Peripheral artery disease ([place]) Has had to have toe amputation due to infection that would not heal due poor circulation. Saw peripheral vascular surgeon on 08/11/21.he recently had femoral endarectomy and is doing well.  3. Atrial fibrillation with RVR [facility]) Last saw cardiology in August. Needs repeat follow up. Is doing well on xeralto  4. Type 2 diabetes mellitus with diabetic neuropathy, with long-term current use of insulin (HCC) Fasting blood sugars 110-160. He denies any low blood sugars. He really doe snot watch his diet very closely. Lab Results  Component Value Date   CHOL 160 06/19/2021   HDL 56 06/19/2021   LDLCALC 83 06/19/2021   TRIG 120 06/19/2021   CHOLHDL 2.9 06/19/2021     5. Pure hypercholesterolemia Does not watch diet and does no dedicated exercise. Lab Results  Component Value Date   CHOL 160 06/19/2021   HDL 56 06/19/2021   LDLCALC 83 06/19/2021   TRIG 120 06/19/2021   CHOLHDL 2.9 06/19/2021     6. Gastroesophageal reflux disease without esophagitis Is on protonix daily  7. COPD  ([place]) Is on no inhaler  8. Benign prostatic hyperplasia with incomplete bladder emptying No problems voiding. Has not seen urology. Lab Results  Component Value Date   PSA1 0.5 08/24/2019   PSA1 0.6 11/28/2017      9. Stage 3b chronic kidney disease ([place]) Lab Results   Component Value Date   CREATININE 1.62 (H) 06/30/2021     10. Generalized anxiety disorder Is on no prescription medication. GAD 7 : Generalized Anxiety Score 10/30/2021 06/19/2021 01/28/2021 12/22/2020  Nervous, Anxious, on Edge 0 0 1 1  Control/stop worrying 0 0 1 1  Worry too much - different things 0 0 0 0  Trouble relaxing 0 0 0 0  Restless 0 0 0 0  Easily annoyed or irritable 0 0 1 1  Afraid - awful might happen 0 0 0 0  Total GAD 7 Score 0 0 3 3  Anxiety Difficulty - Not difficult at all Somewhat difficult Somewhat difficult        New complaints: Patient states that he is in need of personal care services. He is not always able to perform his ADL on his own due to his problems with peripheral vascular disease.mainly needs help with meal prep and cleaning up. He says he gets very fatigued and unsteady on his feet. Cannot stand for long periodsof time.  Allergies  Allergen Reactions   Asa [Aspirin] Hives   Penicillins Hives    Hives + vomiting as an adult  - tolerated Ceftriaxone 04/2021   Outpatient Encounter Medications as of 10/30/2021  Medication Sig   alendronate (FOSAMAX) 70 MG tablet TAKE 1 TABLET BY MOUTH ONCE A WEEK. TAKE WITH A FULL GLASS OF WATER ON AN EMPTY STOMACH  atorvastatin (LIPITOR) 40 MG tablet Take 1 tablet (40 mg total) by mouth daily.   benazepril (LOTENSIN) 5 MG tablet Take 1 tablet (5 mg total) by mouth daily.   cetirizine (ZYRTEC) 10 MG tablet Take 10 mg by mouth daily.    clopidogrel (PLAVIX) 75 MG tablet Take 1 tablet (75 mg total) by mouth daily.   collagenase (SANTYL) ointment Apply 1 application topically daily.   fenofibrate (TRICOR) 145 MG tablet Take 1 tablet (145 mg total) by mouth daily.   gabapentin (NEURONTIN) 300 MG capsule Take 1 capsule (300 mg total) by mouth 4 (four) times daily. TAKE 1 CAPSULE BY MOUTH THREE TIMES DAILY FOR PAIN   glucose blood (ACCU-CHEK AVIVA PLUS) test strip CHECK BLOOD SUGAR FOUR TIMES DAILY Dx E11.40    insulin regular (HUMULIN R) 100 units/mL injection INJECT 10 UNITS INTO THE SKIN THREE TIMES DAILY BEFORE MEALS   Insulin Syringe-Needle U-100 (GLOBAL EASY GLIDE INSULIN SYR) 31G X 5/16" 0.3 ML MISC USE WITH INSULIN EVERY DAY Dx E11.40   metFORMIN (GLUCOPHAGE) 500 MG tablet Take 1 tablet (500 mg total) by mouth 2 (two) times daily.   metoprolol succinate (TOPROL-XL) 25 MG 24 hr tablet Take 1 tablet (25 mg total) by mouth daily.   Multiple Vitamin (MULTIVITAMIN) tablet Take 1 tablet by mouth daily.   mupirocin ointment (BACTROBAN) 2 % Apply 1 application topically daily. Left Great Toe   pantoprazole (PROTONIX) 40 MG tablet Take 1 tablet (40 mg total) by mouth daily.   Rivaroxaban (XARELTO) 15 MG TABS tablet Take 1 tablet (15 mg total) by mouth daily with supper.   tamsulosin (FLOMAX) 0.4 MG CAPS capsule Take 1 capsule (0.4 mg total) by mouth daily.   triamcinolone cream (KENALOG) 0.5 % APPLY TO THE AFFECTED AREA(S) THREE TIMES DAILY   vitamin B-12 (CYANOCOBALAMIN) 500 MCG tablet Take 500 mcg by mouth daily.   No facility-administered encounter medications on file as of 10/30/2021.    Past Surgical History:  Procedure Laterality Date   ABDOMINAL AORTOGRAM W/LOWER EXTREMITY N/A 04/30/2021   Procedure: ABDOMINAL AORTOGRAM W/LOWER EXTREMITY;  Surgeon: Cherre Robins, MD;  Location: [facility];  Service: Cardiovascular;  Laterality: N/A;   AMPUTATION TOE     AMPUTATION TOE Left 05/04/2021   Procedure: LEFT GREAT TOE AMPUTATION;  Surgeon: Marty Heck, MD;  Location: [facility];  Service: Vascular;  Laterality: Left;   BYPASS GRAFT FEMORAL-PERONEAL Left 05/04/2021   Procedure: LEFT COMMON FEMORAL-TIBIOPERONEAL TRUNK BYPASS;  Surgeon: Marty Heck, MD;  Location: [facility];  Service: Vascular;  Laterality: Left;   ENDARTERECTOMY FEMORAL Left 05/04/2021   Procedure: LEFT COMMON FEMORAL ENDARTERECTOMY WITH BOVINE PATCH;  Surgeon: Marty Heck, MD;  Location: [facility];  Service:  Vascular;  Laterality: Left;   ENDARTERECTOMY TIBIOPERONEAL Left 05/04/2021   Procedure: LEFT BELOW KNEE POPITEAL WITH  TIBIOPERONEAL ENDARTERECTOMY;  Surgeon: Marty Heck, MD;  Location: [facility];  Service: Vascular;  Laterality: Left;   ENDOVEIN HARVEST OF GREATER SAPHENOUS VEIN  05/04/2021   Procedure: HARVEST OF LEFT GREATER SAPHENOUS VEIN;  Surgeon: Marty Heck, MD;  Location: [facility];  Service: Vascular;;   INSERTION OF ILIAC STENT Left 05/04/2021   Procedure: LEFT COMMON ILLIAC ATERY ANGIOPLASTY WITH STENT;  Surgeon: Marty Heck, MD;  Location: [facility];  Service: Vascular;  Laterality: Left;   INTRAOPERATIVE ARTERIOGRAM Left 05/04/2021   Procedure: LEFT ARTERY ARTERIOGRAM;  Surgeon: Marty Heck, MD;  Location: [facility];  Service: Vascular;  Laterality: Left;   IRRIGATION AND DEBRIDEMENT FOOT Left 05/04/2021   Procedure: IRRIGATION AND DEBRIDEMENT FOOT;  Surgeon: Marty Heck, MD;  Location: [facility];  Service: Vascular;  Laterality: Left;   KNEE SURGERY Left    NOSE SURGERY     TEE WITHOUT CARDIOVERSION N/A 05/01/2021   Procedure: TRANSESOPHAGEAL ECHOCARDIOGRAM (TEE);  Surgeon: Skeet Latch, MD;  [facility];  Service: Cardiovascular;  Laterality: N/A;    Family History  Problem Relation Age of Onset   Stroke Mother    Heart disease Mother        CABG   Cancer Father       Controlled substance contract: n/a     Review of Systems  Constitutional:  Negative for diaphoresis.  Eyes:  Negative for pain.  Respiratory:  Negative for shortness of breath.   Cardiovascular:  Negative for chest pain, palpitations and leg swelling.  Gastrointestinal:  Negative for abdominal pain.  Endocrine: Negative for polydipsia.  Skin:  Negative for rash.  Neurological:  Negative for dizziness, weakness and headaches.  Hematological:  Does not bruise/bleed easily.  All other systems reviewed and are negative.     Objective:   Physical Exam Vitals  and nursing note reviewed.  Constitutional:      Appearance: Normal appearance. He is well-developed.  HENT:     Head: Normocephalic.     Nose: Nose normal.     Mouth/Throat:     Mouth: Mucous membranes are moist.     Pharynx: Oropharynx is clear.  Eyes:     Pupils: Pupils are equal, round, and reactive to light.  Neck:     Thyroid: No thyroid mass or thyromegaly.     Vascular: No carotid bruit or JVD.     Trachea: Phonation normal.  Cardiovascular:     Rate and Rhythm: Normal rate and regular rhythm.  Pulmonary:     Effort: Pulmonary effort is normal. No respiratory distress.     Breath sounds: Normal breath sounds.  Abdominal:     General: Bowel sounds are normal.     Palpations: Abdomen is soft.     Tenderness: There is no abdominal tenderness.  Musculoskeletal:        General: Normal range of motion.     Cervical back: Normal range of motion and neck supple.  Lymphadenopathy:     Cervical: No cervical adenopathy.  Skin:    General: Skin is warm and dry.     Comments: Till in walking shoe from toe amputation. Incision healing nicely. Wound edges well approximated - no drainage - left foot  Neurological:     Mental Status: He is alert and oriented to person, place, and time.  Psychiatric:        Behavior: Behavior normal.        Thought Content: Thought content normal.        Judgment: Judgment normal.    BP 120/69    Pulse 86    Temp 98.3 F (36.8 C) (Oral)    Resp 20    Ht 6' (1.829 m)    Wt 211 lb (95.7 kg)    SpO2 95%    BMI 28.62 kg/m   Hgba1c 7.4%     Assessment & Plan:   Todd Zhang comes in today with chief complaint of Medical Management of Chronic Issues   Diagnosis and orders addressed:  1. Essential hypertension Low sodium diet - Microalbumin / creatinine urine ratio - Bayer DCA Hb A1c Waived -  CBC with Differential/Platelet - CMP14+EGFR - Lipid panel - PSA, total and free - Thyroid Panel With TSH  2. Peripheral artery disease  ([place]) Keep follow up with peripheral vascular surgeon - Microalbumin / creatinine urine ratio - Bayer DCA Hb A1c Waived - CBC with Differential/Platelet - CMP14+EGFR - Lipid panel - PSA, total and free - Thyroid Panel With TSH - Rivaroxaban (XARELTO) 15 MG TABS tablet; Take 1 tablet (15 mg total) by mouth daily with supper.  Dispense: 90 tablet; Refill: 1  3. Atrial fibrillation with RVR ([place]) Report heart racing - Microalbumin / creatinine urine ratio - Bayer DCA Hb A1c Waived - CBC with Differential/Platelet - CMP14+EGFR - Lipid panel - PSA, total and free - Thyroid Panel With TSH - Rivaroxaban (XARELTO) 15 MG TABS tablet; Take 1 tablet (15 mg total) by mouth daily with supper.  Dispense: 90 tablet; Refill: 1  4. Type 2 diabetes mellitus with diabetic neuropathy, with long-term current use of insulin (HCC) Watch carbs in diet - metFORMIN (GLUCOPHAGE) 500 MG tablet; Take 1 tablet (500 mg total) by mouth 2 (two) times daily.  Dispense: 180 tablet; Refill: 1 - insulin regular (HUMULIN R) 100 units/mL injection; INJECT 10 UNITS INTO THE SKIN THREE TIMES DAILY BEFORE MEALS  Dispense: 30 mL; Refill: 1  5. Pure hypercholesterolemia Low fat diet - atorvastatin (LIPITOR) 40 MG tablet; Take 1 tablet (40 mg total) by mouth daily.  Dispense: 90 tablet; Refill: 1 - fenofibrate (TRICOR) 145 MG tablet; Take 1 tablet (145 mg total) by mouth daily.  Dispense: 90 tablet; Refill: 1  6. Gastroesophageal reflux disease without esophagitis Avoid spicy foods Do not eat 2 hours prior to bedtime - pantoprazole (PROTONIX) 40 MG tablet; Take 1 tablet (40 mg total) by mouth daily.  Dispense: 90 tablet; Refill: 1  7. Mucopurulent chronic bronchitis ([place])  8. Benign prostatic hyperplasia with incomplete bladder emptying Report any voiding problems - tamsulosin (FLOMAX) 0.4 MG CAPS capsule; Take 1 capsule (0.4 mg total) by mouth daily.  Dispense: 90 capsule; Refill: 1  9. Stage 3b chronic kidney  disease ([place]) Labs pending  10. Generalized anxiety disorder Stress manaegment  11. DDD (degenerative disc disease), cervical - gabapentin (NEURONTIN) 300 MG capsule; Take 1 capsule (300 mg total) by mouth 4 (four) times daily. TAKE 1 CAPSULE BY MOUTH THREE TIMES DAILY FOR PAIN  Dispense: 120 capsule; Refill: 5  12. Chronic low back pain without sciatica, unspecified back pain laterality - gabapentin (NEURONTIN) 300 MG capsule; Take 1 capsule (300 mg total) by mouth 4 (four) times daily. TAKE 1 CAPSULE BY MOUTH THREE TIMES DAILY FOR PAIN  Dispense: 120 capsule; Refill: 5  Application for PCS referred to [place] pending Health Maintenance reviewed Diet and exercise encouraged  Follow up plan: 3 months   [name] [name], FNP

## 2021-10-31 LAB — CBC WITH DIFFERENTIAL/PLATELET
Basophils Absolute: 0 10*3/uL (ref 0.0–0.2)
Basos: 0 %
EOS (ABSOLUTE): 0.1 10*3/uL (ref 0.0–0.4)
Eos: 1 %
Hematocrit: 40.9 % (ref 37.5–51.0)
Hemoglobin: 13.6 g/dL (ref 13.0–17.7)
Immature Grans (Abs): 0 10*3/uL (ref 0.0–0.1)
Immature Granulocytes: 0 %
Lymphocytes Absolute: 2.1 10*3/uL (ref 0.7–3.1)
Lymphs: 29 %
MCH: 29.7 pg (ref 26.6–33.0)
MCHC: 33.3 g/dL (ref 31.5–35.7)
MCV: 89 fL (ref 79–97)
Monocytes Absolute: 0.5 10*3/uL (ref 0.1–0.9)
Monocytes: 7 %
Neutrophils Absolute: 4.3 10*3/uL (ref 1.4–7.0)
Neutrophils: 63 %
Platelets: 207 10*3/uL (ref 150–450)
RBC: 4.58 x10E6/uL (ref 4.14–5.80)
RDW: 15.1 % (ref 11.6–15.4)
WBC: 7 10*3/uL (ref 3.4–10.8)

## 2021-10-31 LAB — CMP14+EGFR
ALT: 11 IU/L (ref 0–44)
AST: 16 IU/L (ref 0–40)
Albumin/Globulin Ratio: 2.1 (ref 1.2–2.2)
Albumin: 3.9 g/dL (ref 3.7–4.7)
Alkaline Phosphatase: 52 IU/L (ref 44–121)
BUN/Creatinine Ratio: 19 (ref 10–24)
BUN: 34 mg/dL — ABNORMAL HIGH (ref 8–27)
Bilirubin Total: 0.4 mg/dL (ref 0.0–1.2)
CO2: 22 mmol/L (ref 20–29)
Calcium: 9.4 mg/dL (ref 8.6–10.2)
Chloride: 104 mmol/L (ref 96–106)
Creatinine, Ser: 1.75 mg/dL — ABNORMAL HIGH (ref 0.76–1.27)
Globulin, Total: 1.9 g/dL (ref 1.5–4.5)
Glucose: 191 mg/dL — ABNORMAL HIGH (ref 70–99)
Potassium: 5 mmol/L (ref 3.5–5.2)
Sodium: 139 mmol/L (ref 134–144)
Total Protein: 5.8 g/dL — ABNORMAL LOW (ref 6.0–8.5)
eGFR: 40 mL/min/{1.73_m2} — ABNORMAL LOW (ref >59)

## 2021-10-31 LAB — PSA, TOTAL AND FREE
PSA, Free Pct: 38.6 %
PSA, Free: 0.27 ng/mL
Prostate Specific Ag, Serum: 0.7 ng/mL (ref 0.0–4.0)

## 2021-10-31 LAB — THYROID PANEL WITH TSH
Free Thyroxine Index: 1.6 (ref 1.2–4.9)
T3 Uptake Ratio: 25 % (ref 24–39)
T4, Total: 6.3 ug/dL (ref 4.5–12.0)
TSH: 2.31 u[IU]/mL (ref 0.450–4.500)

## 2021-10-31 LAB — MICROALBUMIN / CREATININE URINE RATIO
Creatinine, Urine: 58 mg/dL
Microalb/Creat Ratio: 30 mg/g creat — ABNORMAL HIGH (ref 0–29)
Microalbumin, Urine: 17.6 ug/mL

## 2021-10-31 LAB — LIPID PANEL
Chol/HDL Ratio: 4.5 ratio (ref 0.0–5.0)
Cholesterol, Total: 197 mg/dL (ref 100–199)
HDL: 44 mg/dL (ref >39)
LDL Chol Calc (NIH): 126 mg/dL — ABNORMAL HIGH (ref 0–99)
Triglycerides: 154 mg/dL — ABNORMAL HIGH (ref 0–149)
VLDL Cholesterol Cal: 27 mg/dL (ref 5–40)

## 2021-11-03 ENCOUNTER — Ambulatory Visit: Payer: Medicare Other

## 2021-11-03 DIAGNOSIS — I4891 Unspecified atrial fibrillation: Secondary | ICD-10-CM

## 2021-11-03 DIAGNOSIS — J441 Chronic obstructive pulmonary disease with (acute) exacerbation: Secondary | ICD-10-CM

## 2021-11-03 DIAGNOSIS — I10 Essential (primary) hypertension: Secondary | ICD-10-CM

## 2021-11-03 DIAGNOSIS — F411 Generalized anxiety disorder: Secondary | ICD-10-CM

## 2021-11-03 DIAGNOSIS — E7800 Pure hypercholesterolemia, unspecified: Secondary | ICD-10-CM

## 2021-11-03 DIAGNOSIS — I739 Peripheral vascular disease, unspecified: Secondary | ICD-10-CM

## 2021-11-03 NOTE — Chronic Care Management (AMB) (Signed)
Chronic Care Management    Clinical Social Work Note  11/03/2021 Name: Todd Zhang MRN: [id number] DOB: Oct 31, 1945  Todd Zhang is a 76 y.o. year old male who is a primary care patient of [name], [place]. The CCM team was consulted to assist the patient with chronic disease management and/or care coordination needs related to: [employer] .   Engaged with patient by telephone for follow up visit in response to provider referral for social work chronic care management and care coordination services.   Consent to Services:  The patient was given information about Chronic Care Management services, agreed to services, and gave verbal consent prior to initiation of services.  Please see initial visit note for detailed documentation.   Patient agreed to services and consent obtained.   Assessment: Review of patient past medical history, allergies, medications, and health status, including review of relevant consultants reports was performed today as part of a comprehensive evaluation and provision of chronic care management and care coordination services.     SDOH (Social Determinants of Health) assessments and interventions performed:  SDOH Interventions    Flowsheet Row Most Recent Value  SDOH Interventions   Physical Activity Interventions Other (Comments)  [has some walking challenges. uses a cane or walker as needed to help him walk]  Stress Interventions Provide Counseling  [client has stress related to managing medical needs]  Depression Interventions/Treatment  Counseling        Advanced Directives Status: See Vynca application for related entries.  CCM Care Plan  Allergies  Allergen Reactions   Asa [Aspirin] Hives   Penicillins Hives    Hives + vomiting as an adult  - tolerated Ceftriaxone 04/2021    Outpatient Encounter Medications as of 11/03/2021  Medication Sig   alendronate (FOSAMAX) 70 MG tablet TAKE 1 TABLET BY MOUTH ONCE A WEEK. TAKE WITH A  FULL GLASS OF WATER ON AN EMPTY STOMACH   atorvastatin (LIPITOR) 40 MG tablet Take 1 tablet (40 mg total) by mouth daily.   benazepril (LOTENSIN) 5 MG tablet Take 1 tablet (5 mg total) by mouth daily.   cetirizine (ZYRTEC) 10 MG tablet Take 10 mg by mouth daily.  (Patient not taking: Reported on 10/30/2021)   clopidogrel (PLAVIX) 75 MG tablet Take 1 tablet (75 mg total) by mouth daily.   collagenase (SANTYL) ointment Apply 1 application topically daily.   fenofibrate (TRICOR) 145 MG tablet Take 1 tablet (145 mg total) by mouth daily.   gabapentin (NEURONTIN) 300 MG capsule Take 1 capsule (300 mg total) by mouth 4 (four) times daily. TAKE 1 CAPSULE BY MOUTH THREE TIMES DAILY FOR PAIN   glucose blood (ACCU-CHEK AVIVA PLUS) test strip CHECK BLOOD SUGAR FOUR TIMES DAILY Dx E11.40   insulin regular (HUMULIN R) 100 units/mL injection INJECT 10 UNITS INTO THE SKIN THREE TIMES DAILY BEFORE MEALS   Insulin Syringe-Needle U-100 (GLOBAL EASY GLIDE INSULIN SYR) 31G X 5/16" 0.3 ML MISC USE WITH INSULIN EVERY DAY Dx E11.40   metFORMIN (GLUCOPHAGE) 500 MG tablet Take 1 tablet (500 mg total) by mouth 2 (two) times daily.   metoprolol succinate (TOPROL-XL) 25 MG 24 hr tablet Take 1 tablet (25 mg total) by mouth daily.   Multiple Vitamin (MULTIVITAMIN) tablet Take 1 tablet by mouth daily. (Patient not taking: Reported on 10/30/2021)   mupirocin ointment (BACTROBAN) 2 % Apply 1 application topically daily. Left Great Toe   pantoprazole (PROTONIX) 40 MG tablet Take 1 tablet (40 mg total) by mouth daily.  Rivaroxaban (XARELTO) 15 MG TABS tablet Take 1 tablet (15 mg total) by mouth daily with supper.   tamsulosin (FLOMAX) 0.4 MG CAPS capsule Take 1 capsule (0.4 mg total) by mouth daily.   triamcinolone cream (KENALOG) 0.5 % APPLY TO THE AFFECTED AREA(S) THREE TIMES DAILY   vitamin B-12 (CYANOCOBALAMIN) 500 MCG tablet Take 500 mcg by mouth daily. (Patient not taking: Reported on 10/30/2021)   No facility-administered  encounter medications on file as of 11/03/2021.    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 09/13/2020   Pure hypercholesterolemia 07/16/2020   Peripheral artery disease ([place])    COPD exacerbation ([place]) 05/14/2019   DDD (degenerative disc disease), cervical 11/06/2018   Generalized anxiety disorder 11/29/2017   Benign prostatic hyperplasia with incomplete bladder emptying 11/28/2017   Gastroesophageal reflux disease without esophagitis 09/07/2017   Essential hypertension 09/01/2017   Onychomycosis 09/01/2017   History of cardioversion 08/28/2015   Type 2 diabetes mellitus with diabetic neuropathy, with long-term current use of insulin ([place]) 06/19/2014   Back pain 06/19/2014   Neck pain 06/19/2014    Conditions to be addressed/monitored: monitor housing needs of client; monitor the in home care needs of client  Care Plan : LCSW Care plan  Updates made by Katha Cabal, LCSW since 11/03/2021 12:00 AM     Problem: Coping Skills (General Plan of Care)      Goal: Coping Skills Enhanced: manage housing needs of client; manage home care needs   Start Date: 11/03/2021  Expected End Date: 01/28/2022  This Visit's Progress: On track  Recent Progress: On track  Priority: Medium  Note:   Current barriers:   Patient in need of assistance with connecting to community resources for possible help with housing needs of client Patient is unable to independently navigate community resource options without care coordination  support Mobility issues and pain issues Decreased energy Breathing challenges In home care needs  Clinical Goals:   LCSW to call client in next 30 days to discuss housing needs and housing situation of client Client to call RNCM or LCSW as needed in next 30 days for CCM program support LCSW to communicate with client in next 30 days to discuss in home care support for client Client to call RNCM as needed in next 30 days for CCM nursing support  Clinical Interventions:  Collaboration with [name], FNP regarding development and update of comprehensive plan of care as evidenced by provider attestation and co-signature Discussed with client his current needs, including sleeping issues of client, appetite of client, and pain issues of client.  Client said he has pain issues; he said sometimes these pain issues keep him from sleeping well at night Reviewed ambulation needs of client. Client said he has a cane and a walker to use as needed to help him ambulate.  Discussed housing needs. He continues to reside at apartment . He said he is currently moving from second floor to first floor in apartment complex. This will allow him easier access  to an apartment. Discussed family support. Sadly, Daven reported that his brother died several months ago.  His brother had lived in same apartment complex where Rickey resided.  Encouraged Eddie Dibbles to call [facility] as needed for CCM nursing support Encouraged Jkobe to call LCSW as needed for CCM social work support Discussed medication procurement for client. Discussed skin care for client PCS has been suggested for client. LCSW completed today a portion of PCS application for [name]. Electronic copy sent to Howell Pringle LPN for her to complete some sections of PCS application for [place]. Estill Bamberg to forward this form to [name], FNP for her completion of medical assessment and signature. Howell Pringle to fax completed PCS form to [facility].  Patient Coping Skills: Has support from his sister, Nonie Hoyer Has no transport needs Attends scheduled medical appointments  Patient deficits:   Mobility issues Pain issues Depression issues  Patient Goals:   Patient will call RNCM or LCSW as needed in next 30 days for CCM support Patient will attend all scheduled client medical appointments in next 30 days Patient will communicate regularly with sister, Nonie Hoyer, in next 30 days to discuss ongoing needs of client -  Follow Up Plan: LCSW to call client or his sister, Nonie Hoyer, on 12/29/21 at 10:00 AM to assess client needs at that time      [name].Tyrisha Benninger MSW, [place] representative [facility] Care Management [phone]

## 2021-11-03 NOTE — Patient Instructions (Addendum)
Visit Information  Patient Goals:  Protect My Health (Patient).  Manage housing needs of client. Client to talk with [facility] nursing staff about breathing challenges of client. Manage mobility needs  Timeframe:  Short-Term Goal Priority:  Medium Progress:  On Track Start Date:         11/03/21               Expected End Date:       01/28/22            Follow Up Date  12/29/21 at 10:00 AM   Protect My Health (Patient)  Manage housing needs of client ; client to talk with [facility] nursing staff about breathing challenges of client. Manage mobility needs   Why is this important?   Screening tests can find diseases early when they are easier to treat.  Your doctor or nurse will talk with you about which tests are important for you.  Getting shots for common diseases like the flu and shingles will help prevent them.     Patient Coping Skills: Attends scheduled medical appointments No transport needs Completes ADLs daily  Patient deficits:   Mobility issues Housing needs  Patient Goals:   Patient will call RNCM or LCSW as needed in next 30 days for CCM support Patient will attend all scheduled client medical appointments in next 30 days Patient will communicate regularly with his brother, Nathaneil Canary, in next 30 days to discuss ongoing needs of client -  Follow Up Plan: LCSW to call client or his sister, Nonie Hoyer, on 12/29/21 at 10:00 AM to assess client needs at that time    [name].Aysen Shieh MSW, [place] Holiday representative [facility] Care Management [phone]

## 2021-11-09 ENCOUNTER — Telehealth: Payer: Medicare Other

## 2021-11-10 DIAGNOSIS — I10 Essential (primary) hypertension: Secondary | ICD-10-CM

## 2021-11-10 DIAGNOSIS — E1159 Type 2 diabetes mellitus with other circulatory complications: Secondary | ICD-10-CM

## 2021-11-10 DIAGNOSIS — I4891 Unspecified atrial fibrillation: Secondary | ICD-10-CM

## 2021-11-10 DIAGNOSIS — E1169 Type 2 diabetes mellitus with other specified complication: Secondary | ICD-10-CM

## 2021-11-10 DIAGNOSIS — I152 Hypertension secondary to endocrine disorders: Secondary | ICD-10-CM

## 2021-11-10 DIAGNOSIS — J441 Chronic obstructive pulmonary disease with (acute) exacerbation: Secondary | ICD-10-CM

## 2021-11-10 DIAGNOSIS — Z794 Long term (current) use of insulin: Secondary | ICD-10-CM

## 2021-11-10 DIAGNOSIS — E7800 Pure hypercholesterolemia, unspecified: Secondary | ICD-10-CM

## 2021-11-10 DIAGNOSIS — E1140 Type 2 diabetes mellitus with diabetic neuropathy, unspecified: Secondary | ICD-10-CM

## 2021-11-17 ENCOUNTER — Ambulatory Visit (INDEPENDENT_AMBULATORY_CARE_PROVIDER_SITE_OTHER): Payer: Medicare Other

## 2021-11-17 ENCOUNTER — Other Ambulatory Visit: Payer: Self-pay

## 2021-11-17 DIAGNOSIS — E1151 Type 2 diabetes mellitus with diabetic peripheral angiopathy without gangrene: Secondary | ICD-10-CM | POA: Diagnosis not present

## 2021-11-17 DIAGNOSIS — Z7902 Long term (current) use of antithrombotics/antiplatelets: Secondary | ICD-10-CM

## 2021-11-17 DIAGNOSIS — I129 Hypertensive chronic kidney disease with stage 1 through stage 4 chronic kidney disease, or unspecified chronic kidney disease: Secondary | ICD-10-CM

## 2021-11-17 DIAGNOSIS — K219 Gastro-esophageal reflux disease without esophagitis: Secondary | ICD-10-CM

## 2021-11-17 DIAGNOSIS — Z7901 Long term (current) use of anticoagulants: Secondary | ICD-10-CM

## 2021-11-17 DIAGNOSIS — T8789 Other complications of amputation stump: Secondary | ICD-10-CM

## 2021-11-17 DIAGNOSIS — J449 Chronic obstructive pulmonary disease, unspecified: Secondary | ICD-10-CM

## 2021-11-17 DIAGNOSIS — Z794 Long term (current) use of insulin: Secondary | ICD-10-CM

## 2021-11-17 DIAGNOSIS — E1165 Type 2 diabetes mellitus with hyperglycemia: Secondary | ICD-10-CM

## 2021-11-17 DIAGNOSIS — Z89412 Acquired absence of left great toe: Secondary | ICD-10-CM

## 2021-11-17 DIAGNOSIS — T8744 Infection of amputation stump, left lower extremity: Secondary | ICD-10-CM

## 2021-11-17 DIAGNOSIS — N1832 Chronic kidney disease, stage 3b: Secondary | ICD-10-CM

## 2021-11-17 DIAGNOSIS — E1142 Type 2 diabetes mellitus with diabetic polyneuropathy: Secondary | ICD-10-CM

## 2021-11-17 DIAGNOSIS — E785 Hyperlipidemia, unspecified: Secondary | ICD-10-CM

## 2021-11-17 DIAGNOSIS — E1122 Type 2 diabetes mellitus with diabetic chronic kidney disease: Secondary | ICD-10-CM

## 2021-11-19 ENCOUNTER — Ambulatory Visit (INDEPENDENT_AMBULATORY_CARE_PROVIDER_SITE_OTHER): Payer: Medicare Other | Admitting: Licensed Clinical Social Worker

## 2021-11-19 DIAGNOSIS — I739 Peripheral vascular disease, unspecified: Secondary | ICD-10-CM

## 2021-11-19 DIAGNOSIS — Z794 Long term (current) use of insulin: Secondary | ICD-10-CM

## 2021-11-19 DIAGNOSIS — F411 Generalized anxiety disorder: Secondary | ICD-10-CM

## 2021-11-19 DIAGNOSIS — E1140 Type 2 diabetes mellitus with diabetic neuropathy, unspecified: Secondary | ICD-10-CM

## 2021-11-19 DIAGNOSIS — I4891 Unspecified atrial fibrillation: Secondary | ICD-10-CM

## 2021-11-19 DIAGNOSIS — J441 Chronic obstructive pulmonary disease with (acute) exacerbation: Secondary | ICD-10-CM

## 2021-11-19 DIAGNOSIS — I10 Essential (primary) hypertension: Secondary | ICD-10-CM

## 2021-11-19 NOTE — Patient Instructions (Addendum)
Visit Information ? ?Patient Goals:  Protect My Health (Patient).  Manage housing needs of client. Client to talk with [facility] nursing staff about breathing challenges of client. ?Manage mobility needs ? ?Timeframe:  Short-Term Goal ?Priority:  Medium ?Progress:  On Track ?Start Date:         11/19/21                 ?Expected End Date:       02/17/22           ? ?Follow Up Date  01/12/22 at 11:00 AM ?  ?Protect My Health (Patient)  Manage housing needs of client ; client to talk with [facility] nursing staff about breathing challenges of client. Manage mobility needs ?  ?Why is this important?   ?Screening tests can find diseases early when they are easier to treat.  ?Your doctor or nurse will talk with you about which tests are important for you.  ?Getting shots for common diseases like the flu and shingles will help prevent them.   ?  ?Patient Coping Skills: ?Attends scheduled medical appointments ?No transport needs ?Completes ADLs daily ? ?Patient deficits:   ?Mobility issues ?Housing needs ? ?Patient Goals:  ? ?Patient will call RNCM or LCSW as needed in next 30 days for CCM support ?Patient will attend all scheduled client medical appointments in next 30 days ?Patient will communicate regularly with his brother, Nathaneil Canary, in next 30 days to discuss ongoing needs of client ?-  ?Follow Up Plan: LCSW to call client or his sister, Nonie Hoyer, on 01/12/22 at 11:00 AM to assess client needs at that time   ? ?[name].Shaquasia Caponigro MSW, LCSW ?Licensed Clinical Social Worker ?[place] Management ?[phone] ?

## 2021-11-19 NOTE — Chronic Care Management (AMB) (Signed)
Chronic Care Management    Clinical Social Work Note  11/19/2021 Name: Todd Zhang MRN: [id number] DOB: 12-13-1945  Todd Zhang is a 76 y.o. year old male who is a primary care patient of Todd Zhang, Todd Zhang. The CCM team was consulted to assist the patient with chronic disease management and/or care coordination needs related to: [employer] .   Engaged with patient by telephone for follow up visit in response to provider referral for social work chronic care management and care coordination services.   Consent to Services:  The patient was given information about Chronic Care Management services, agreed to services, and gave verbal consent prior to initiation of services.  Please see initial visit note for detailed documentation.   Patient agreed to services and consent obtained.   Assessment: Review of patient past medical history, allergies, medications, and health status, including review of relevant consultants reports was performed today as part of a comprehensive evaluation and provision of chronic care management and care coordination services.     SDOH (Social Determinants of Health) assessments and interventions performed:  SDOH Interventions    Flowsheet Row Most Recent Value  SDOH Interventions   Physical Activity Interventions Other (Comments)  [client cannot stand very long without having to sit down. He may need Diabetic Shoes to help him with ambulation]  Stress Interventions Provide Counseling  [client has stress related to managing medical needs]  Depression Interventions/Treatment  Counseling        Advanced Directives Status: See Vynca application for related entries.  CCM Care Plan  Allergies  Allergen Reactions   Asa [Aspirin] Hives   Penicillins Hives    Hives + vomiting as an adult  - tolerated Ceftriaxone 04/2021    Outpatient Encounter Medications as of 11/19/2021  Medication Sig   alendronate (FOSAMAX) 70 MG tablet TAKE 1 TABLET  BY MOUTH ONCE A WEEK. TAKE WITH A FULL GLASS OF WATER ON AN EMPTY STOMACH   atorvastatin (LIPITOR) 40 MG tablet Take 1 tablet (40 mg total) by mouth daily.   benazepril (LOTENSIN) 5 MG tablet Take 1 tablet (5 mg total) by mouth daily.   cetirizine (ZYRTEC) 10 MG tablet Take 10 mg by mouth daily.  (Patient not taking: Reported on 10/30/2021)   clopidogrel (PLAVIX) 75 MG tablet Take 1 tablet (75 mg total) by mouth daily.   collagenase (SANTYL) ointment Apply 1 application topically daily.   fenofibrate (TRICOR) 145 MG tablet Take 1 tablet (145 mg total) by mouth daily.   gabapentin (NEURONTIN) 300 MG capsule Take 1 capsule (300 mg total) by mouth 4 (four) times daily. TAKE 1 CAPSULE BY MOUTH THREE TIMES DAILY FOR PAIN   glucose blood (ACCU-CHEK AVIVA PLUS) test strip CHECK BLOOD SUGAR FOUR TIMES DAILY Dx E11.40   insulin regular (HUMULIN R) 100 units/mL injection INJECT 10 UNITS INTO THE SKIN THREE TIMES DAILY BEFORE MEALS   Insulin Syringe-Needle U-100 (GLOBAL EASY GLIDE INSULIN SYR) 31G X 5/16" 0.3 ML MISC USE WITH INSULIN EVERY DAY Dx E11.40   metFORMIN (GLUCOPHAGE) 500 MG tablet Take 1 tablet (500 mg total) by mouth 2 (two) times daily.   metoprolol succinate (TOPROL-XL) 25 MG 24 hr tablet Take 1 tablet (25 mg total) by mouth daily.   Multiple Vitamin (MULTIVITAMIN) tablet Take 1 tablet by mouth daily. (Patient not taking: Reported on 10/30/2021)   mupirocin ointment (BACTROBAN) 2 % Apply 1 application topically daily. Left Great Toe   pantoprazole (PROTONIX) 40 MG tablet Take 1 tablet (40  mg total) by mouth daily.   Rivaroxaban (XARELTO) 15 MG TABS tablet Take 1 tablet (15 mg total) by mouth daily with supper.   tamsulosin (FLOMAX) 0.4 MG CAPS capsule Take 1 capsule (0.4 mg total) by mouth daily.   triamcinolone cream (KENALOG) 0.5 % APPLY TO THE AFFECTED AREA(S) THREE TIMES DAILY   vitamin B-12 (CYANOCOBALAMIN) 500 MCG tablet Take 500 mcg by mouth daily. (Patient not taking: Reported on  10/30/2021)   No facility-administered encounter medications on file as of 11/19/2021.    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 09/13/2020   Pure hypercholesterolemia 07/16/2020   Peripheral artery disease ([place])    COPD exacerbation ([place]) 05/14/2019   DDD (degenerative disc disease), cervical 11/06/2018   Generalized anxiety disorder 11/29/2017   Benign prostatic hyperplasia with incomplete bladder emptying 11/28/2017   Gastroesophageal reflux disease without esophagitis 09/07/2017   Essential hypertension 09/01/2017   Onychomycosis 09/01/2017   History of cardioversion 08/28/2015   Type 2 diabetes mellitus with diabetic neuropathy, with long-term current use of insulin ([place]) 06/19/2014   Back pain 06/19/2014   Neck pain 06/19/2014    Conditions to be addressed/monitored: monitor housing needs of client. Monitor in home care needs of client  Care Plan : LCSW Care plan  Updates made by Katha Cabal, LCSW since 11/19/2021 12:00 AM     Problem: Coping Skills (General Plan of Care)      Goal: Coping Skills Enhanced: manage housing needs of client; manage home care needs   Start Date: 11/19/2021  Expected End Date: 02/18/2022  This Visit's Progress: On track  Recent Progress: On track  Priority: Medium  Note:   Current barriers:   Patient in need of assistance with connecting to community resources for possible help with housing needs of client Patient is unable to independently navigate community resource options  without care coordination support Mobility issues and pain issues Decreased energy Breathing challenges In home care needs  Clinical Goals:   LCSW to call client in next 30 days to discuss housing needs and housing situation of client Client to call RNCM or LCSW as needed in next 30 days for CCM program support LCSW to communicate with client in next 30 days to discuss in home care support for client Client to call RNCM as needed in next 30 days for CCM nursing support  Clinical Interventions:  Collaboration with Todd Pretty, FNP regarding development and update of comprehensive plan of care as evidenced by provider attestation and co-signature Discussed with client his current needs, including sleeping issues of client, appetite of client, and pain issues of client.  Client said he has pain issues; he said sometimes these pain issues keep him from sleeping well at night Reviewed ambulation needs of client. Client said he is walking without a device at present. He talked of Diabetic Shoes. LCSW collaborated today with [employer] regarding client request for Diabetic Shoes.  Discussed housing needs. He continues to reside at apartment . He said he recently moved to  a 1st floor apartment and is getting settled there. He likes his new 1st floor apartment. Encouraged Eddie Dibbles to call [facility] as needed for CCM nursing support Encouraged Daniil to call LCSW as needed for CCM social work support Discussed medication procurement for client. Discussed skin care for client PCS has been applied for related to [place].  Client is awaiting decision by [facility] regarding PCS application  Patient Coping Skills: Has support from his sister, Nonie Hoyer Has no transport needs Attends scheduled medical appointments  Patient deficits:   Mobility issues Pain issues Depression issues  Patient Goals:   Patient will call RNCM or LCSW as needed in next 30 days for CCM support Patient will  attend all scheduled client medical appointments in next 30 days Patient will communicate regularly with sister, Nonie Hoyer, in next 30 days to discuss ongoing needs of client -  Follow Up Plan: LCSW to call client or his sister, Nonie Hoyer, on 01/12/22 at 11:00 AM to assess client needs at that time      [name].Jacques Fife MSW, [place] representative [facility] Care Management [phone]

## 2021-11-25 ENCOUNTER — Ambulatory Visit: Payer: Medicare Other | Admitting: *Deleted

## 2021-11-25 DIAGNOSIS — J441 Chronic obstructive pulmonary disease with (acute) exacerbation: Secondary | ICD-10-CM

## 2021-11-25 DIAGNOSIS — I10 Essential (primary) hypertension: Secondary | ICD-10-CM

## 2021-11-25 DIAGNOSIS — E1140 Type 2 diabetes mellitus with diabetic neuropathy, unspecified: Secondary | ICD-10-CM

## 2021-11-25 NOTE — Patient Instructions (Signed)
Visit Information ? ?Patient Goals/Self-Care Activities: ?Patient will self administer medications as prescribed ?Patient will attend all scheduled provider appointments ?Patient will continue to perform ADL's independently ?Patient will call provider office for new concerns or questions ?Patient will not use illicit drugs ?Patient will check and record blood pressure daily and call PCP with any readings outside of recommended range ?Patient will call RN Care Manager as needed [phone] ?Patient will check and record blood sugar 3 times daily and call PCP with any readings outside of recommended range ?Patient will clean and check feet daily for any cuts, sores, calluses, etc and will call PCP with any concerns ?Patient will wear clean and well fitting socks and shoes  ?Patient will talk with PCP regarding need for diabetic shoes ?Patient will talk with CCM team about personal care service application and about food stamps  ? ?The patient verbalized understanding of instructions, educational materials, and care plan provided today and declined offer to receive copy of patient instructions, educational materials, and care plan.  ? ?Plan:Telephone follow up appointment with care management team member scheduled for:  12/09/21 with RNCM ?The patient has been provided with contact information for the care management team and has been advised to call with any health related questions or concerns.  ? ?[name], BSN, RN-BC ?Embedded Chronic Care Manager ?[facility] Care Management ?Direct Dial: [phone] ?  ?

## 2021-11-25 NOTE — Chronic Care Management (AMB) (Signed)
Chronic Care Management   CCM RN Visit Note  11/25/2021 Name: Todd Zhang MRN: [id number] DOB: 10-Nov-1945  Subjective: Todd Zhang is a 76 y.o. year old male who is a primary care patient of [name], [place]. The care management team was consulted for assistance with disease management and care coordination needs.    Engaged with patient by telephone for follow up visit in response to provider referral for case management and/or care coordination services.   Consent to Services:  The patient was given information about Chronic Care Management services, agreed to services, and gave verbal consent prior to initiation of services.  Please see initial visit note for detailed documentation.   Patient agreed to services and verbal consent obtained.   Assessment: Review of patient past medical history, allergies, medications, health status, including review of consultants reports, laboratory and other test data, was performed as part of comprehensive evaluation and provision of chronic care management services.   SDOH (Social Determinants of Health) assessments and interventions performed:    CCM Care Plan  Allergies  Allergen Reactions   Asa [Aspirin] Hives   Penicillins Hives    Hives + vomiting as an adult  - tolerated Ceftriaxone 04/2021    Outpatient Encounter Medications as of 11/25/2021  Medication Sig   alendronate (FOSAMAX) 70 MG tablet TAKE 1 TABLET BY MOUTH ONCE A WEEK. TAKE WITH A FULL GLASS OF WATER ON AN EMPTY STOMACH   atorvastatin (LIPITOR) 40 MG tablet Take 1 tablet (40 mg total) by mouth daily.   benazepril (LOTENSIN) 5 MG tablet Take 1 tablet (5 mg total) by mouth daily.   cetirizine (ZYRTEC) 10 MG tablet Take 10 mg by mouth daily.  (Patient not taking: Reported on 10/30/2021)   clopidogrel (PLAVIX) 75 MG tablet Take 1 tablet (75 mg total) by mouth daily.   collagenase (SANTYL) ointment Apply 1 application topically daily.   fenofibrate (TRICOR) 145 MG  tablet Take 1 tablet (145 mg total) by mouth daily.   gabapentin (NEURONTIN) 300 MG capsule Take 1 capsule (300 mg total) by mouth 4 (four) times daily. TAKE 1 CAPSULE BY MOUTH THREE TIMES DAILY FOR PAIN   glucose blood (ACCU-CHEK AVIVA PLUS) test strip CHECK BLOOD SUGAR FOUR TIMES DAILY Dx E11.40   insulin regular (HUMULIN R) 100 units/mL injection INJECT 10 UNITS INTO THE SKIN THREE TIMES DAILY BEFORE MEALS   Insulin Syringe-Needle U-100 (GLOBAL EASY GLIDE INSULIN SYR) 31G X 5/16" 0.3 ML MISC USE WITH INSULIN EVERY DAY Dx E11.40   metFORMIN (GLUCOPHAGE) 500 MG tablet Take 1 tablet (500 mg total) by mouth 2 (two) times daily.   metoprolol succinate (TOPROL-XL) 25 MG 24 hr tablet Take 1 tablet (25 mg total) by mouth daily.   Multiple Vitamin (MULTIVITAMIN) tablet Take 1 tablet by mouth daily. (Patient not taking: Reported on 10/30/2021)   mupirocin ointment (BACTROBAN) 2 % Apply 1 application topically daily. Left Great Toe   pantoprazole (PROTONIX) 40 MG tablet Take 1 tablet (40 mg total) by mouth daily.   Rivaroxaban (XARELTO) 15 MG TABS tablet Take 1 tablet (15 mg total) by mouth daily with supper.   tamsulosin (FLOMAX) 0.4 MG CAPS capsule Take 1 capsule (0.4 mg total) by mouth daily.   triamcinolone cream (KENALOG) 0.5 % APPLY TO THE AFFECTED AREA(S) THREE TIMES DAILY   vitamin B-12 (CYANOCOBALAMIN) 500 MCG tablet Take 500 mcg by mouth daily. (Patient not taking: Reported on 10/30/2021)   No facility-administered encounter medications on file as of 11/25/2021.  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 09/13/2020   Pure hypercholesterolemia 07/16/2020   Peripheral artery disease (HCC)    COPD exacerbation (HCC) 05/14/2019   DDD (degenerative disc disease), cervical 11/06/2018   Generalized anxiety disorder 11/29/2017   Benign prostatic hyperplasia with incomplete bladder emptying 11/28/2017   Gastroesophageal reflux disease without esophagitis 09/07/2017   Essential hypertension 09/01/2017   Onychomycosis 09/01/2017   History of cardioversion 08/28/2015   Type 2 diabetes mellitus with diabetic neuropathy, with long-term current use of insulin (HCC) 06/19/2014   Back pain 06/19/2014   Neck pain 06/19/2014    Conditions to be addressed/monitored:HTN, COPD, and DMII  [facility] : [facility]  Updates made by Gwenith DailyHudy, Dilyn Osoria N, RN since 11/25/2021 12:00 AM     Problem: Chronic Disease Management Needs   Priority: High     Long-Range Goal: Care Management and Care Coordination Needs Associated with HTN, DM and COPD   This Visit's Progress: On track  Recent Progress: On track  Priority: High  Note:   Current Barriers:  Care Coordination needs related to Transportation  Chronic Disease Management support and education needs related to HTN, COPD, and DMII Financial Constraints.  Literacy barriers Transportation barriers  RNCM Clinical Goal(s):  Patient will verbalize understanding of plan for management of HTN, COPD, and DMII through collaboration with RN Care manager, provider, and care team.  Patient will work with CCM team and PCP office regarding application for PCS services  Interventions: 1:1 collaboration with primary care provider regarding development and update of comprehensive plan of care as evidenced by provider attestation and co-signature Inter-disciplinary care team collaboration (see longitudinal plan of care) Evaluation of current treatment plan related to  self management and  patient's adherence to plan as established by provider  SDOH Barriers (Status: Goal on Track (progressing): YES.)  Patient interviewed and SDOH assessment performed Patient interviewed and appropriate assessments performed Provided patient with information about PCS services through [facility] Discussed plans with patient for ongoing care management follow up and provided patient with direct contact information for care management team Collaborated with Lorna FewScott Forrest, LCSW regarding level of care need Patient is able to perform ADLs but tires easily and could use some help around the house with meal prep and clean-up Discussed that patient has moved down to the first floor apartment. He still has some unpacking to do. [place] or do more outdoor activities, but agrees that an apartment is likely safer and easier to manage considering his age and medical conditions Discussed application for PCS services through [facility]. Verified that application has been submitted. Previously collaborated with LCSW and PCP. RNCM/LCSW to call [facility] and follow-up on application status per patient request Discussed food insecurity. War receiving food stamps but that stopped months ago and he isn't sure why. Encouraged to reach out to DSS but patient requests that RNCM/LCSW reach out on his behalf because he has trouble communicating his questions  and needs at times and understanding their explanation.    COPD: (Status: Goal on Track (progressing): YES.) Discussed the importance of adequate rest and management of fatigue with COPD; Assessed social determinant of health barriers;  Discussed current symptoms and management Discussed mobility and ability to perform ADLs Discussed recent URI and that symptoms have resolved Encouraged to monitor for symptoms of exacerbation    Diabetes:  (Status: Condition stable. Not addressed this visit.) Lab  Results  Component Value Date   HGBA1C 7.4 (H) 10/30/2021   HGBA1C 6.3 (H) 06/30/2021   HGBA1C 6.2 (H) 06/19/2021   Lab Results  Component Value Date   LDLCALC 126 (H) 10/30/2021   CREATININE 1.75 (H) 10/30/2021  Assessed patient's understanding of A1c goal: <8% Chart reviewed including relevant office notes and lab results Discussed home blood sugar testing and encouraged to continue testing and recording blood sugar 3 times a day Advised to call PCP with any readings outside of recommended range Discussed diet Trying to avoid fried/fatty foods and simple carbs Reviewed upcoming appointments Discussed foot care and encouraged to check feet daily and to report any sores, cuts, calluses, etc to PCP Encouraged to wear well fitting socks and shoes Encouraged to follow-up with podiatrist as recommended Collaborated with [facility] Clinical staff regarding need for diabetic shoes. Insurance covers once per calendar year so he is eligible for a new pair. Reminded of appt with PCP on 5/22 and added appt note to do a detailed foot exam and order diabetic shoes   Hypertension: (Status: Goal on Track (progressing): YES.) Last practice recorded BP readings:  BP Readings from Last 3 Encounters:  10/30/21 120/69  08/11/21 (!) 147/82  07/20/21 123/74   Reviewed symptoms of hypotension and patient is asymptomatic at this time Encouraged patient to check and record blood pressure daily and to call PCP with any readings outside of recommended range Advised to take blood pressure log to PCP and cardio visits Reviewed upcoming appointments Reviewed medications and encouraged compliance  Patient Goals/Self-Care Activities: Patient will self administer medications as prescribed Patient will attend all scheduled provider appointments Patient will continue to perform ADL's independently Patient will call provider office for new concerns or questions Patient will not use illicit drugs Patient will check  and record blood pressure daily and call PCP with any readings outside of recommended range Patient will call RN Care Manager as needed [phone] Patient will check and record blood sugar 3 times daily and call PCP with any readings outside of recommended range Patient will clean and check feet daily for any cuts, sores, calluses, etc and will call PCP with any concerns Patient will wear clean and well fitting socks and shoes  Patient will talk with PCP regarding need for diabetic shoes Patient will talk with CCM team about personal care service application and about food stamps   Plan:Telephone follow up appointment with care management team member scheduled for:  12/09/21 with RNCM The patient has been provided with contact information for the care management team and has been advised to call with any health related questions or concerns.   Chong Sicilian, BSN, RN-BC Embedded Chronic Care Manager [facility] [place] [facility] Direct Dial: [phone]

## 2021-11-27 NOTE — Patient Instructions (Signed)
Todd Zhang , Thank you for taking time to come for your Medicare Wellness Visit. I appreciate your ongoing commitment to your health goals. Please review the following plan we discussed and let me know if I can assist you in the future.   Screening recommendations/referrals: Colonoscopy: Done 06/19/2010 - recommended repeat in 10 years - patient declines Recommended yearly ophthalmology/optometry visit for glaucoma screening and checkup Recommended yearly dental visit for hygiene and checkup  Vaccinations: Influenza vaccine: Declined - recommended annually Pneumococcal vaccine: Done 05/12/2015 & 10/26/2017 Tdap vaccine: Done 05/12/2015 - Repeat in 10 years Shingles vaccine: Zostavax done 2016 - due for new vaccine - Shingrix is 2 doses 2-6 months apart and over 90% effective     Covid-19: Done 10/25/2019, 11/22/2019, & 07/23/2020 - for additional boosters, contact pharmacy  Advanced directives: Advance directive discussed with you today. Even though you declined this today, please call our office should you change your mind, and we can give you the proper paperwork for you to fill out.   Conditions/risks identified: Aim for 30 minutes of exercise or brisk walking, 6-8 glasses of water, and 5 servings of fruits and vegetables each day. A care guide will be in touch with you soon regarding helping you get diabetic shoes.  Next appointment: Follow up in one year for your annual wellness visit.   Preventive Care 75 Years and Older, Male  Preventive care refers to lifestyle choices and visits with your health care provider that can promote health and wellness. What does preventive care include? A yearly physical exam. This is also called an annual well check. Dental exams once or twice a year. Routine eye exams. Ask your health care provider how often you should have your eyes checked. Personal lifestyle choices, including: Daily care of your teeth and gums. Regular physical activity. Eating a  healthy diet. Avoiding tobacco and drug use. Limiting alcohol use. Practicing safe sex. Taking low doses of aspirin every day. Taking vitamin and mineral supplements as recommended by your health care provider. What happens during an annual well check? The services and screenings done by your health care provider during your annual well check will depend on your age, overall health, lifestyle risk factors, and family history of disease. Counseling  Your health care provider may ask you questions about your: Alcohol use. Tobacco use. Drug use. Emotional well-being. Home and relationship well-being. Sexual activity. Eating habits. History of falls. Memory and ability to understand (cognition). Work and work Statistician. Screening  You may have the following tests or measurements: Height, weight, and BMI. Blood pressure. Lipid and cholesterol levels. These may be checked every 5 years, or more frequently if you are over 44 years old. Skin check. Lung cancer screening. You may have this screening every year starting at age 53 if you have a 30-pack-year history of smoking and currently smoke or have quit within the past 15 years. Fecal occult blood test (FOBT) of the stool. You may have this test every year starting at age 37. Flexible sigmoidoscopy or colonoscopy. You may have a sigmoidoscopy every 5 years or a colonoscopy every 10 years starting at age 93. Prostate cancer screening. Recommendations will vary depending on your family history and other risks. Hepatitis C blood test. Hepatitis B blood test. Sexually transmitted disease (STD) testing. Diabetes screening. This is done by checking your blood sugar (glucose) after you have not eaten for a while (fasting). You may have this done every 1-3 years. Abdominal aortic aneurysm (AAA) screening. You may  need this if you are a current or former smoker. Osteoporosis. You may be screened starting at age 34 if you are at high risk. Talk  with your health care provider about your test results, treatment options, and if necessary, the need for more tests. Vaccines  Your health care provider may recommend certain vaccines, such as: Influenza vaccine. This is recommended every year. Tetanus, diphtheria, and acellular pertussis (Tdap, Td) vaccine. You may need a Td booster every 10 years. Zoster vaccine. You may need this after age 16. Pneumococcal 13-valent conjugate (PCV13) vaccine. One dose is recommended after age 75. Pneumococcal polysaccharide (PPSV23) vaccine. One dose is recommended after age 24. Talk to your health care provider about which screenings and vaccines you need and how often you need them. This information is not intended to replace advice given to you by your health care provider. Make sure you discuss any questions you have with your health care provider. Document Released: 09/26/2015 Document Revised: 05/19/2016 Document Reviewed: 07/01/2015 Elsevier Interactive Patient Education  2017 [employer] Prevention in the Home Falls can cause injuries. They can happen to people of all ages. There are many things you can do to make your home safe and to help prevent falls. What can I do on the outside of my home? Regularly fix the edges of walkways and driveways and fix any cracks. Remove anything that might make you trip as you walk through a door, such as a raised step or threshold. Trim any bushes or trees on the path to your home. Use bright outdoor lighting. Clear any walking paths of anything that might make someone trip, such as rocks or tools. Regularly check to see if handrails are loose or broken. Make sure that both sides of any steps have handrails. Any raised decks and porches should have guardrails on the edges. Have any leaves, snow, or ice cleared regularly. Use sand or salt on walking paths during winter. Clean up any spills in your garage right away. This includes oil or grease  spills. What can I do in the bathroom? Use night lights. Install grab bars by the toilet and in the tub and shower. Do not use towel bars as grab bars. Use non-skid mats or decals in the tub or shower. If you need to sit down in the shower, use a plastic, non-slip stool. Keep the floor dry. Clean up any water that spills on the floor as soon as it happens. Remove soap buildup in the tub or shower regularly. Attach bath mats securely with double-sided non-slip rug tape. Do not have throw rugs and other things on the floor that can make you trip. What can I do in the bedroom? Use night lights. Make sure that you have a light by your bed that is easy to reach. Do not use any sheets or blankets that are too big for your bed. They should not hang down onto the floor. Have a firm chair that has side arms. You can use this for support while you get dressed. Do not have throw rugs and other things on the floor that can make you trip. What can I do in the kitchen? Clean up any spills right away. Avoid walking on wet floors. Keep items that you use a lot in easy-to-reach places. If you need to reach something above you, use a strong step stool that has a grab bar. Keep electrical cords out of the way. Do not use floor polish or wax that makes  floors slippery. If you must use wax, use non-skid floor wax. Do not have throw rugs and other things on the floor that can make you trip. What can I do with my stairs? Do not leave any items on the stairs. Make sure that there are handrails on both sides of the stairs and use them. Fix handrails that are broken or loose. Make sure that handrails are as long as the stairways. Check any carpeting to make sure that it is firmly attached to the stairs. Fix any carpet that is loose or worn. Avoid having throw rugs at the top or bottom of the stairs. If you do have throw rugs, attach them to the floor with carpet tape. Make sure that you have a light switch at the  top of the stairs and the bottom of the stairs. If you do not have them, ask someone to add them for you. What else can I do to help prevent falls? Wear shoes that: Do not have high heels. Have rubber bottoms. Are comfortable and fit you well. Are closed at the toe. Do not wear sandals. If you use a stepladder: Make sure that it is fully opened. Do not climb a closed stepladder. Make sure that both sides of the stepladder are locked into place. Ask someone to hold it for you, if possible. Clearly mark and make sure that you can see: Any grab bars or handrails. First and last steps. Where the edge of each step is. Use tools that help you move around (mobility aids) if they are needed. These include: Canes. Walkers. Scooters. Crutches. Turn on the lights when you go into a dark area. Replace any light bulbs as soon as they burn out. Set up your furniture so you have a clear path. Avoid moving your furniture around. If any of your floors are uneven, fix them. If there are any pets around you, be aware of where they are. Review your medicines with your doctor. Some medicines can make you feel dizzy. This can increase your chance of falling. Ask your doctor what other things that you can do to help prevent falls. This information is not intended to replace advice given to you by your health care provider. Make sure you discuss any questions you have with your health care provider. Document Released: 06/26/2009 Document Revised: 02/05/2016 Document Reviewed: 10/04/2014 Elsevier Interactive Patient Education  2017 [employer].

## 2021-11-30 ENCOUNTER — Ambulatory Visit (INDEPENDENT_AMBULATORY_CARE_PROVIDER_SITE_OTHER): Payer: Medicare Other

## 2021-11-30 VITALS — Wt 208.0 lb

## 2021-11-30 DIAGNOSIS — Z89422 Acquired absence of other left toe(s): Secondary | ICD-10-CM | POA: Diagnosis not present

## 2021-11-30 DIAGNOSIS — Z89412 Acquired absence of left great toe: Secondary | ICD-10-CM

## 2021-11-30 DIAGNOSIS — Z794 Long term (current) use of insulin: Secondary | ICD-10-CM

## 2021-11-30 DIAGNOSIS — Z0000 Encounter for general adult medical examination without abnormal findings: Secondary | ICD-10-CM

## 2021-11-30 DIAGNOSIS — M869 Osteomyelitis, unspecified: Secondary | ICD-10-CM

## 2021-11-30 DIAGNOSIS — Z599 Problem related to housing and economic circumstances, unspecified: Secondary | ICD-10-CM

## 2021-11-30 DIAGNOSIS — Z0001 Encounter for general adult medical examination with abnormal findings: Secondary | ICD-10-CM | POA: Diagnosis not present

## 2021-11-30 DIAGNOSIS — E1140 Type 2 diabetes mellitus with diabetic neuropathy, unspecified: Secondary | ICD-10-CM

## 2021-11-30 NOTE — Progress Notes (Signed)
Subjective:   Todd Zhang is a 76 y.o. male who presents for Medicare Annual/Subsequent preventive examination.  Virtual Visit via Telephone Note  I connected with  Todd Zhang on 11/30/21 at  9:00 AM EDT by telephone and verified that I am speaking with the correct person using two identifiers.  Location: Patient: Home Provider: [facility] Persons participating in the virtual visit: patient/Nurse Health Advisor   I discussed the limitations, risks, security and privacy concerns of performing an evaluation and management service by telephone and the availability of in person appointments. The patient expressed understanding and agreed to proceed.  Interactive audio and video telecommunications were attempted between this nurse and patient, however failed, due to patient having technical difficulties OR patient did not have access to video capability.  We continued and completed visit with audio only.  Some vital signs may be absent or patient reported.   Todd Dimarco E Savannha Welle, LPN   Review of Systems     Cardiac Risk Factors include: advanced age (>28men, >52 women);diabetes mellitus;dyslipidemia;sedentary lifestyle;male gender;hypertension;Other (see comment), Risk factor comments: PAD, COPD, A.Fib, hx of osteomyelitis     Objective:    Today's Vitals   11/30/21 0903 11/30/21 0904  Weight: 208 lb (94.3 kg)   PainSc: 4  4    Body mass index is 28.21 kg/m.  Advanced Directives 11/30/2021 04/24/2021 11/27/2020 11/27/2019 11/22/2018 10/26/2017  Does Patient Have a Medical Advance Directive? No No No No No No  Would patient like information on creating a medical advance directive? No - Patient declined No - Patient declined No - Patient declined No - Patient declined Yes (MAU/Ambulatory/Procedural Areas - Information given) Yes (ED - Information included in AVS)    Current Medications (verified) Outpatient Encounter Medications as of 11/30/2021  Medication Sig   alendronate (FOSAMAX) 70 MG  tablet TAKE 1 TABLET BY MOUTH ONCE A WEEK. TAKE WITH A FULL GLASS OF WATER ON AN EMPTY STOMACH   atorvastatin (LIPITOR) 40 MG tablet Take 1 tablet (40 mg total) by mouth daily.   benazepril (LOTENSIN) 5 MG tablet Take 1 tablet (5 mg total) by mouth daily.   cetirizine (ZYRTEC) 10 MG tablet Take 10 mg by mouth daily.   clopidogrel (PLAVIX) 75 MG tablet Take 1 tablet (75 mg total) by mouth daily.   collagenase (SANTYL) ointment Apply 1 application topically daily.   fenofibrate (TRICOR) 145 MG tablet Take 1 tablet (145 mg total) by mouth daily.   gabapentin (NEURONTIN) 300 MG capsule Take 1 capsule (300 mg total) by mouth 4 (four) times daily. TAKE 1 CAPSULE BY MOUTH THREE TIMES DAILY FOR PAIN   glucose blood (ACCU-CHEK AVIVA PLUS) test strip CHECK BLOOD SUGAR FOUR TIMES DAILY Dx E11.40   insulin regular (HUMULIN R) 100 units/mL injection INJECT 10 UNITS INTO THE SKIN THREE TIMES DAILY BEFORE MEALS   Insulin Syringe-Needle U-100 (GLOBAL EASY GLIDE INSULIN SYR) 31G X 5/16" 0.3 ML MISC USE WITH INSULIN EVERY DAY Dx E11.40   metFORMIN (GLUCOPHAGE) 500 MG tablet Take 1 tablet (500 mg total) by mouth 2 (two) times daily.   metoprolol succinate (TOPROL-XL) 25 MG 24 hr tablet Take 1 tablet (25 mg total) by mouth daily.   Multiple Vitamin (MULTIVITAMIN) tablet Take 1 tablet by mouth daily.   mupirocin ointment (BACTROBAN) 2 % Apply 1 application topically daily. Left Great Toe   pantoprazole (PROTONIX) 40 MG tablet Take 1 tablet (40 mg total) by mouth daily.   Rivaroxaban (XARELTO) 15 MG TABS tablet Take  1 tablet (15 mg total) by mouth daily with supper.   tamsulosin (FLOMAX) 0.4 MG CAPS capsule Take 1 capsule (0.4 mg total) by mouth daily.   triamcinolone cream (KENALOG) 0.5 % APPLY TO THE AFFECTED AREA(S) THREE TIMES DAILY   vitamin B-12 (CYANOCOBALAMIN) 500 MCG tablet Take 500 mcg by mouth daily.   No facility-administered encounter medications on file as of 11/30/2021.    Allergies (verified) Asa  [aspirin] and Penicillins   History: Past Medical History:  Diagnosis Date   Atrial fibrillation ([place])    Cervical spinal stenosis    DDD (degenerative disc disease), cervical    DDD (degenerative disc disease), lumbar    Diabetes mellitus without complication ([place])    Hyperlipidemia    Hypertension    Peripheral artery disease ([place])    Past Surgical History:  Procedure Laterality Date   ABDOMINAL AORTOGRAM W/LOWER EXTREMITY N/A 04/30/2021   Procedure: ABDOMINAL AORTOGRAM W/LOWER EXTREMITY;  Surgeon: Cherre Robins, MD;  Location: [facility];  Service: Cardiovascular;  Laterality: N/A;   AMPUTATION TOE     AMPUTATION TOE Left 05/04/2021   Procedure: LEFT GREAT TOE AMPUTATION;  Surgeon: Marty Heck, MD;  Location: [facility];  Service: Vascular;  Laterality: Left;   BYPASS GRAFT FEMORAL-PERONEAL Left 05/04/2021   Procedure: LEFT COMMON FEMORAL-TIBIOPERONEAL TRUNK BYPASS;  Surgeon: Marty Heck, MD;  Location: [facility];  Service: Vascular;  Laterality: Left;   ENDARTERECTOMY FEMORAL Left 05/04/2021   Procedure: LEFT COMMON FEMORAL ENDARTERECTOMY WITH BOVINE PATCH;  Surgeon: Marty Heck, MD;  Location: [facility];  Service: Vascular;  Laterality: Left;   ENDARTERECTOMY TIBIOPERONEAL Left 05/04/2021   Procedure: LEFT BELOW KNEE POPITEAL WITH  TIBIOPERONEAL ENDARTERECTOMY;  Surgeon: Marty Heck, MD;  Location: [facility];  Service: Vascular;  Laterality: Left;   ENDOVEIN HARVEST OF GREATER SAPHENOUS VEIN  05/04/2021   Procedure: HARVEST OF LEFT GREATER SAPHENOUS VEIN;  Surgeon: Marty Heck, MD;  Location: [facility];  Service: Vascular;;   INSERTION OF ILIAC STENT Left 05/04/2021   Procedure: LEFT COMMON ILLIAC ATERY ANGIOPLASTY WITH STENT;  Surgeon: Marty Heck, MD;  Location: [place];  Service: Vascular;  Laterality: Left;   INTRAOPERATIVE ARTERIOGRAM Left 05/04/2021   Procedure: LEFT ARTERY ARTERIOGRAM;  Surgeon: Marty Heck, MD;  Location: [place];   Service: Vascular;  Laterality: Left;   IRRIGATION AND DEBRIDEMENT FOOT Left 05/04/2021   Procedure: IRRIGATION AND DEBRIDEMENT FOOT;  Surgeon: Marty Heck, MD;  Location: [facility];  Service: Vascular;  Laterality: Left;   KNEE SURGERY Left    NOSE SURGERY     TEE WITHOUT CARDIOVERSION N/A 05/01/2021   Procedure: TRANSESOPHAGEAL ECHOCARDIOGRAM (TEE);  Surgeon: Skeet Latch, MD;  Location: [facility];  Service: Cardiovascular;  Laterality: N/A;   Family History  Problem Relation Age of Onset   Stroke Mother    Heart disease Mother        CABG   Cancer Father    Social History   Socioeconomic History   Marital status: Divorced    Spouse name: Not on file   Number of children: 2   Years of education: 2   Highest education level: 11th grade  Occupational History   Occupation: Disabled  Tobacco Use   Smoking status: Former    Packs/day: 1.00    Types: Cigarettes    Start date: 06/19/1966    Quit date: 06/20/2011    Years since quitting: 10.4   Smokeless tobacco: Never  Vaping  Use   Vaping Use: Never used  Substance and Sexual Activity   Alcohol use: No    Comment: quit drinking in 1982   Drug use: Not Currently    Comment: marijuana and cocaine in the past, quit in 2012   Sexual activity: Not Currently  Other Topics Concern   Not on file  Social History Narrative   Lives alone - ground level apartment   Social Determinants of Health   Financial Resource Strain: Low Risk    Difficulty of Paying Living Expenses: Not very hard  Food Insecurity: Food Insecurity Present   Worried About [place] in the Last Year: Sometimes true   Ran Out of Food in the Last Year: Never true  Transportation Needs: No Transportation Needs   Lack of Transportation (Medical): No   Lack of Transportation (Non-Medical): No  Physical Activity: Insufficiently Active   Days of Exercise per Week: 7 days   Minutes of Exercise per Session: 20 min  Stress: Stress Concern  Present   Feeling of Stress : To some extent  Social Connections: Moderately Integrated   Frequency of Communication with Friends and Family: Twice a week   Frequency of Social Gatherings with Friends and Family: Twice a week   Attends Religious Services: More than 4 times per year   Active Member of [employer] or Organizations: Yes   Attends Archivist Meetings: More than 4 times per year   Marital Status: Widowed    Tobacco Counseling Counseling given: Not Answered   Clinical Intake:  Pre-visit preparation completed: Yes  Pain : 0-10 Pain Score: 4  Pain Type: Chronic pain Pain Location: Hip Pain Orientation: Right, Left Pain Radiating Towards: legs Pain Descriptors / Indicators: Aching, Discomfort Pain Onset: More than a month ago Pain Frequency: Intermittent     BMI - recorded: 28.21 Nutritional Status: BMI 25 -29 Overweight Nutritional Risks: None Diabetes: Yes CBG done?: No Did pt. bring in CBG monitor from home?: No  How often do you need to have someone help you when you read instructions, pamphlets, or other written materials from your doctor or pharmacy?: 4 - Often  Diabetic?Nutrition Risk Assessment:  Has the patient had any N/V/D within the last 2 months?  No  Does the patient have any non-healing wounds?   Yes, seeing specialist Has the patient had any unintentional weight loss or weight gain?  No   Diabetes:  Is the patient diabetic?  Yes  If diabetic, was a CBG obtained today?  No  Did the patient bring in their glucometer from home?  No  How often do you monitor your CBG's? Once daily fasting.   Financial Strains and Diabetes Management:  Are you having any financial strains with the device, your supplies or your medication? No .  Does the patient want to be seen by Chronic Care Management for management of their diabetes?  No  Would the patient like to be referred to a Nutritionist or for Diabetic Management?  No   Diabetic  Exams:  Diabetic Eye Exam: Completed 07/16/2021.   Diabetic Foot Exam: Completed 07/20/2021. Pt has been advised about the importance in completing this exam.  Interpreter Needed?: No  Information entered by :: Angi Goodell, LPN   Activities of Daily Living In your present state of health, do you have any difficulty performing the following activities: 11/30/2021 04/24/2021  Hearing? N N  Vision? N N  Difficulty concentrating or making decisions? N N  Walking or climbing  stairs? Y Y  Dressing or bathing? N Y  Doing errands, shopping? N N  Preparing Food and eating ? N -  Using the Toilet? N -  In the past six months, have you accidently leaked urine? N -  Do you have problems with loss of bowel control? N -  Managing your Medications? N -  Managing your Finances? N -  Housekeeping or managing your Housekeeping? N -  Some recent data might be hidden    Patient Care Team: [name], FNP as PCP - General (Family Medicine) Garald Balding, MD as Consulting Physician (Orthopedic Surgery) Steffanie Rainwater, [place] as Consulting Physician (Podiatry) Shea Evans, [name], LCSW as Social Worker (Licensed Clinical Social Worker) Ilean China, RN as Case Manager  Indicate any recent [place] you may have received from other than [facility] providers in the past year (date may be approximate).     Assessment:   This is a routine wellness examination for [place].  Hearing/Vision screen Hearing Screening - Comments:: C/o mild hearing difficulties  - no hearing aids Vision Screening - Comments:: Wears rx glasses - up to date with routine eye exams with Rosana Hoes at [facility] in [place] issues and exercise activities discussed: Current Exercise Habits: Home exercise routine, Type of exercise: walking;stretching, Time (Minutes): 20, Frequency (Times/Week): 7, Weekly Exercise (Minutes/Week): 140, Intensity: Mild, Exercise limited by: respiratory conditions(s);orthopedic  condition(s)   Goals Addressed               This Visit's Progress     "I need diabetic shoes" (pt-stated)        CARE PLAN ENTRY (see longitudinal plan of care for additional care plan information)  Current Barriers:  Care Coordination needs related to diabetic shoes in a patient with diabetes (disease states)  Nurse Case Manager Clinical Goal(s):  Over the next 30 days, patient will work with PCP to address needs related to diabetic shoes  Interventions:  Inter-disciplinary care team collaboration (see longitudinal plan of care) Chart reviewed Previously collaborated with [employer] [phone] Previously collaborated with Jamelle Haring, LPN regarding order Discussed upcoming appointment with [name], FNP RE: foot exam for diabetic shoe order [place] rejected previous order because insurance is requiring more documentation  Patient Self Care Activities:  Performs ADL's independently Performs IADL's independently  Please see past updates related to this goal by clicking on the "Past Updates" button in the selected goal   Has received diabetic shoes       AWV   Not on track     11/27/2020 AWV Goal: Fall Prevention  Over the next year, patient will decrease their risk for falls by: Using assistive devices, such as a cane or walker, as needed Identifying fall risks within their home and correcting them by: Removing throw rugs Adding handrails to stairs or ramps Removing clutter and keeping a clear pathway throughout the home Increasing light, especially at night Adding shower handles/bars Raising toilet seat Identifying potential personal risk factors for falls: Medication side effects Incontinence/urgency Vestibular dysfunction Hearing loss Musculoskeletal disorders Neurological disorders Orthostatic hypotension        DIET - REDUCE SUGAR INTAKE   On track     Continue to watch carbohydrate intake by avoiding baked goods and  candy as much as possible.       Manage My Emotions   On track      Depression Screen [facility] 2/9 Scores 11/30/2021 11/19/2021 11/03/2021 10/30/2021 09/09/2021 07/20/2021 07/14/2021  PHQ - 2 Score 1 2 2  0 2 0 2  PHQ- 9 Score 4 7 8  0 7 0 7    Fall Risk Fall Risk  11/30/2021 10/30/2021 07/20/2021 06/19/2021 01/28/2021  Falls in the past year? 1 1 0 1 1  Comment - - - - -  Number falls in past yr: 1 1 - 1 1  Injury with Fall? 0 0 - 0 0  Risk Factor Category  - - - - -  Risk for fall due to : History of fall(s);Impaired balance/gait;Orthopedic patient;Medication side effect History of fall(s);Impaired balance/gait - - History of fall(s)  Risk for fall due to: Comment - - - - -  Follow up Falls prevention discussed;Education provided Falls evaluation completed - - Education provided    FALL RISK PREVENTION PERTAINING TO THE HOME:  Any stairs in or around the home? No  If so, are there any without handrails? No  Home free of loose throw rugs in walkways, pet beds, electrical cords, etc? Yes  Adequate lighting in your home to reduce risk of falls? Yes   ASSISTIVE DEVICES UTILIZED TO PREVENT FALLS:  Life alert? No  Use of a cane, walker or w/c?  Cane prn Grab bars in the bathroom? Yes  Shower chair or bench in shower? Yes  Elevated toilet seat or a handicapped toilet? Yes   TIMED UP AND GO:  Was the test performed? No . Telephonic visit  Cognitive Function: Normal cognitive status assessed by direct observation by this Nurse Health Advisor. No abnormalities found. He does have trouble reading and writing, so testing is limited.   MMSE - Mini Mental State Exam 10/26/2017  Orientation to time 5  Orientation to Place 4  Registration 3  Attention/ Calculation 2  Recall 0  Language- name 2 objects 2  Language- repeat 1  Language- follow 3 step command 3  Language- read & follow direction 1  Write a sentence 1  Copy design 1  Total score 23     6CIT Screen 11/30/2021 11/27/2020 11/27/2019  11/22/2018  What Year? 0 points 0 points - 0 points  What month? 0 points 0 points - 0 points  What time? 0 points 0 points 0 points 0 points  Count back from 20 0 points 0 points 0 points 0 points  Months in reverse 2 points 0 points 2 points 0 points  Repeat phrase 4 points 6 points 10 points 4 points  Total Score 6 6 - 4    Immunizations Immunization History  Administered Date(s) Administered   Fluad Quad(high Dose 65+) 06/18/2020   Influenza Split 10/01/2016   Influenza, High Dose Seasonal PF 05/31/2017, 04/26/2019   Influenza,inj,Quad PF,6+ Mos 06/19/2014   Influenza,inj,quad, With Preservative 05/23/2018   Moderna Sars-Covid-2 Vaccination 10/25/2019, 11/22/2019, 07/23/2020   Pneumococcal Conjugate-13 05/12/2015   Pneumococcal Polysaccharide-23 10/26/2017   Tdap 05/12/2015   Zoster, Live 05/20/2015    TDAP status: Up to date  Flu Vaccine status: Declined, Education has been provided regarding the importance of this vaccine but patient still declined. Advised may receive this vaccine at local pharmacy or Health Dept. Aware to provide a copy of the vaccination record if obtained from local pharmacy or Health Dept. Verbalized acceptance and understanding.  Pneumococcal vaccine status: Up to date  Covid-19 vaccine status: Completed vaccines  Qualifies for Shingles Vaccine? Yes   Zostavax completed Yes   Shingrix Completed?: No.    Education has been provided regarding the importance of this vaccine. Patient  has been advised to call insurance company to determine out of pocket expense if they have not yet received this vaccine. Advised may also receive vaccine at local pharmacy or Health Dept. Verbalized acceptance and understanding.  Screening Tests Health Maintenance  Topic Date Due   COVID-19 Vaccine (4 - Booster for Moderna series) 09/17/2020   INFLUENZA VACCINE  12/11/2021 (Originally 04/13/2021)   Zoster Vaccines- Shingrix (1 of 2) 01/27/2022 (Originally 07/28/1965)    Hepatitis C Screening  10/30/2022 (Originally 07/29/1964)   HEMOGLOBIN A1C  04/29/2022   OPHTHALMOLOGY EXAM  07/16/2022   FOOT EXAM  07/20/2022   TETANUS/TDAP  05/11/2025   Pneumonia Vaccine 58+ Years old  Completed   HPV VACCINES  Aged Out   COLONOSCOPY (Pts 45-1yrs Insurance coverage will need to be confirmed)  Discontinued    Health Maintenance  Health Maintenance Due  Topic Date Due   COVID-19 Vaccine (4 - Booster for Moderna series) 09/17/2020    Colorectal cancer screening: No longer required.  Last done 2011 - declines repeat  Lung Cancer Screening: (Low Dose CT Chest recommended if Age 2-80 years, 30 pack-year currently smoking OR have quit w/in 15years.) does not qualify.  Additional Screening:  Hepatitis C Screening: does qualify; Due  Vision Screening: Recommended annual ophthalmology exams for early detection of glaucoma and other disorders of the eye. Is the patient up to date with their annual eye exam?  Yes  Who is the provider or what is the name of the office in which the patient attends annual eye exams? [facility] in [place], [place] If pt is not established with a provider, would they like to be referred to a provider to establish care? No .   Dental Screening: Recommended annual dental exams for proper oral hygiene  Community Resource Referral / Chronic Care Management: CRR required this visit?  No   CCM required this visit?  No      Plan:     I have personally reviewed and noted the following in the patient's chart:   Medical and social history Use of alcohol, tobacco or illicit drugs  Current medications and supplements including opioid prescriptions. Patient is not currently taking opioid prescriptions. Functional ability and status Nutritional status Physical activity Advanced directives List of other physicians Hospitalizations, surgeries, and ER visits in previous 12 months Vitals Screenings to include cognitive, depression, and  falls Referrals and appointments  In addition, I have reviewed and discussed with patient certain preventive protocols, quality metrics, and best practice recommendations. A written personalized care plan for preventive services as well as general preventive health recommendations were provided to patient.     Sandrea Hammond, LPN   [id number]   Nurse Notes: CRR sent to assist getting diabetic custom fit shoes

## 2021-12-02 ENCOUNTER — Telehealth: Payer: Self-pay

## 2021-12-02 NOTE — Telephone Encounter (Signed)
? ?  Telephone encounter was:  Unsuccessful.  12/02/2021 ?Name: Todd Zhang MRN: [id number] DOB: [birth year] ? ?Unsuccessful outbound call made today to assist with:   diabetic shoes ? ?Outreach Attempt:  1st Attempt ? ?A HIPAA compliant voice message was left requesting a return call.  Instructed patient to call back at [phone]. ? ?Tyrez Berrios, [place], CHC ?Care Guide  Embedded Care Coordination ?[facility]  Care Management  ?[address] ?[place], [place] [address] ???[email or web address]  ?? [phone]   ?[email or web address] ?  ?

## 2021-12-03 ENCOUNTER — Other Ambulatory Visit (INDEPENDENT_AMBULATORY_CARE_PROVIDER_SITE_OTHER): Payer: Self-pay

## 2021-12-03 DIAGNOSIS — E1140 Type 2 diabetes mellitus with diabetic neuropathy, unspecified: Secondary | ICD-10-CM

## 2021-12-04 ENCOUNTER — Telehealth: Payer: Self-pay

## 2021-12-04 NOTE — Telephone Encounter (Signed)
? ?  Telephone encounter was:  Successful.  ?12/04/2021 ?Name: SHAWNE ESKELSON MRN: [id number] DOB: 08/26/1946 ? ?NAOMI FITTON is a 76 y.o. year old male who is a primary care patient of [name], FNP . The community resource team was consulted for assistance with  diabetic shoes ? ?Care guide performed the following interventions: Spoke with patient advised him to follow up with his provider about a possible podiatry referral.  ? ?Follow Up Plan:  No further follow up planned at this time. The patient has been provided with needed resources. ? ?Rayetta Veith, AAS Paralegal, CHC ?Care Guide  Embedded Care Coordination ?[facility]  Care Management  ?[address] ?[place], [place] [address] ???[email or web address]  ?? [phone]   ?[email or web address] ?  ?

## 2021-12-09 ENCOUNTER — Ambulatory Visit: Payer: Medicare Other | Admitting: *Deleted

## 2021-12-09 DIAGNOSIS — Z794 Long term (current) use of insulin: Secondary | ICD-10-CM

## 2021-12-09 DIAGNOSIS — I10 Essential (primary) hypertension: Secondary | ICD-10-CM

## 2021-12-09 DIAGNOSIS — E1140 Type 2 diabetes mellitus with diabetic neuropathy, unspecified: Secondary | ICD-10-CM

## 2021-12-11 DIAGNOSIS — E1140 Type 2 diabetes mellitus with diabetic neuropathy, unspecified: Secondary | ICD-10-CM

## 2021-12-11 DIAGNOSIS — Z794 Long term (current) use of insulin: Secondary | ICD-10-CM

## 2021-12-11 DIAGNOSIS — J441 Chronic obstructive pulmonary disease with (acute) exacerbation: Secondary | ICD-10-CM

## 2021-12-11 DIAGNOSIS — I10 Essential (primary) hypertension: Secondary | ICD-10-CM

## 2021-12-11 DIAGNOSIS — I4891 Unspecified atrial fibrillation: Secondary | ICD-10-CM

## 2021-12-21 NOTE — Patient Instructions (Signed)
Visit Information ? ? ?Patient Goals/Self-Care Activities: ?Patient will self administer medications as prescribed ?Patient will attend all scheduled provider appointments ?Patient will continue to perform ADL's independently ?Patient will call provider office for new concerns or questions ?Patient will not use illicit drugs ?Patient will check and record blood pressure daily and call PCP with any readings outside of recommended range ?Patient will call RN Care Manager as needed [phone] ?Patient will check and record blood sugar 3 times daily and call PCP with any readings outside of recommended range ?Patient will clean and check feet daily for any cuts, sores, calluses, etc and will call PCP with any concerns ?Patient will wear clean and well fitting socks and shoes  ?Patient will talk with PCP regarding need for diabetic shoes ?Patient will talk with CCM team about personal care service application and about food stamps  ? ?The patient verbalized understanding of instructions, educational materials, and care plan provided today and declined offer to receive copy of patient instructions, educational materials, and care plan.  ? ?Plan:Telephone follow up appointment with care management team member scheduled for:  12/29/21 with LCSW ?The patient has been provided with contact information for the care management team and has been advised to call with any health related questions or concerns.  ? ?[name], BSN, RN-BC ?Embedded Chronic Care Manager ?[facility] Care Management ?Direct Dial: [phone] ?  ?

## 2021-12-21 NOTE — Chronic Care Management (AMB) (Signed)
Chronic Care Management   CCM RN Visit Note  12/09/2021 Name: Todd Zhang MRN: [id number] DOB: Mar 18, 1946  Subjective: Todd Zhang is a 76 y.o. year old male who is a primary care patient of [name], [place]. The care management team was consulted for assistance with disease management and care coordination needs.    Engaged with patient by telephone for follow up visit in response to provider referral for case management and/or care coordination services.   Consent to Services:  The patient was given information about Chronic Care Management services, agreed to services, and gave verbal consent prior to initiation of services.  Please see initial visit note for detailed documentation.   Patient agreed to services and verbal consent obtained.   Assessment: Review of patient past medical history, allergies, medications, health status, including review of consultants reports, laboratory and other test data, was performed as part of comprehensive evaluation and provision of chronic care management services.   SDOH (Social Determinants of Health) assessments and interventions performed:    CCM Care Plan  Allergies  Allergen Reactions   Asa [Aspirin] Hives   Penicillins Hives    Hives + vomiting as an adult  - tolerated Ceftriaxone 04/2021    Outpatient Encounter Medications as of 12/09/2021  Medication Sig   alendronate (FOSAMAX) 70 MG tablet TAKE 1 TABLET BY MOUTH ONCE A WEEK. TAKE WITH A FULL GLASS OF WATER ON AN EMPTY STOMACH   atorvastatin (LIPITOR) 40 MG tablet Take 1 tablet (40 mg total) by mouth daily.   benazepril (LOTENSIN) 5 MG tablet Take 1 tablet (5 mg total) by mouth daily.   cetirizine (ZYRTEC) 10 MG tablet Take 10 mg by mouth daily.   clopidogrel (PLAVIX) 75 MG tablet Take 1 tablet (75 mg total) by mouth daily.   collagenase (SANTYL) ointment Apply 1 application topically daily.   fenofibrate (TRICOR) 145 MG tablet Take 1 tablet (145 mg total) by mouth  daily.   gabapentin (NEURONTIN) 300 MG capsule Take 1 capsule (300 mg total) by mouth 4 (four) times daily. TAKE 1 CAPSULE BY MOUTH THREE TIMES DAILY FOR PAIN   glucose blood (ACCU-CHEK AVIVA PLUS) test strip CHECK BLOOD SUGAR FOUR TIMES DAILY Dx E11.40   insulin regular (HUMULIN R) 100 units/mL injection INJECT 10 UNITS INTO THE SKIN THREE TIMES DAILY BEFORE MEALS   Insulin Syringe-Needle U-100 (GLOBAL EASY GLIDE INSULIN SYR) 31G X 5/16" 0.3 ML MISC USE WITH INSULIN EVERY DAY Dx E11.40   metFORMIN (GLUCOPHAGE) 500 MG tablet Take 1 tablet (500 mg total) by mouth 2 (two) times daily.   metoprolol succinate (TOPROL-XL) 25 MG 24 hr tablet Take 1 tablet (25 mg total) by mouth daily.   Multiple Vitamin (MULTIVITAMIN) tablet Take 1 tablet by mouth daily.   mupirocin ointment (BACTROBAN) 2 % Apply 1 application topically daily. Left Great Toe   pantoprazole (PROTONIX) 40 MG tablet Take 1 tablet (40 mg total) by mouth daily.   Rivaroxaban (XARELTO) 15 MG TABS tablet Take 1 tablet (15 mg total) by mouth daily with supper.   tamsulosin (FLOMAX) 0.4 MG CAPS capsule Take 1 capsule (0.4 mg total) by mouth daily.   triamcinolone cream (KENALOG) 0.5 % APPLY TO THE AFFECTED AREA(S) THREE TIMES DAILY   vitamin B-12 (CYANOCOBALAMIN) 500 MCG tablet Take 500 mcg by mouth daily.   No facility-administered encounter medications on file as of 12/09/2021.    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 09/13/2020   Pure hypercholesterolemia 07/16/2020    Peripheral artery disease ([place])    COPD exacerbation ([place]) 05/14/2019   DDD (degenerative disc disease), cervical 11/06/2018   Generalized anxiety disorder 11/29/2017   Benign prostatic hyperplasia with incomplete bladder emptying 11/28/2017   Gastroesophageal reflux disease without esophagitis 09/07/2017   Essential hypertension 09/01/2017   Onychomycosis 09/01/2017   History of cardioversion 08/28/2015   Type 2 diabetes mellitus with diabetic neuropathy, with long-term current use of insulin ([place]) 06/19/2014   Back pain 06/19/2014   Neck pain 06/19/2014    Conditions to be addressed/monitored:HTN, COPD, and DMII  Care Plan : [facility] Care Plan     Problem: Chronic Disease Management Needs   Priority: High     Long-Range Goal: Care Management and Care Coordination Needs Associated with HTN, DM and COPD   This Visit's Progress: On track  Recent Progress: On track  Priority: High  Note:   Current Barriers:  Care Coordination needs related to Transportation  Chronic Disease Management support and education needs related to HTN, COPD, and DMII Financial Constraints.  Literacy barriers Transportation barriers  RNCM Clinical Goal(s):  Patient will verbalize understanding of plan for management of HTN, COPD, and DMII through collaboration with RN Care manager, provider, and care team.  Patient will work with CCM team and PCP office regarding application for PCS services  Interventions: 1:1 collaboration with primary care provider regarding development and update of comprehensive plan of care as evidenced by provider attestation and co-signature Inter-disciplinary care team collaboration (see longitudinal plan of care) Evaluation of current treatment plan related to  self management and patient's adherence to plan as established by provider  SDOH Barriers (Status: Goal on Track (progressing): YES.)  Patient interviewed and SDOH assessment performed Patient interviewed and  appropriate assessments performed Provided patient with information about PCS services through [place] with patient for ongoing care management follow up and provided patient with direct contact information for care management team Previously collaborated with Theadore Nan, LCSW regarding level of care need Patient is able to perform ADLs but tires easily and could use some help around the house with meal prep and clean-up Discussed application for PCS services through [employer]. Verified that application has been submitted. Previously collaborated with LCSW and PCP. Patient has not been contacted regarding application.   COPD: (Status: Condition stable. Not addressed this visit.) Discussed the importance of adequate rest and management of fatigue with COPD; Assessed social determinant of health barriers;  Discussed current symptoms and management Discussed mobility and ability to perform ADLs Discussed recent URI and that symptoms have resolved Encouraged to monitor for symptoms of exacerbation    Diabetes:  (Status: Goal on Track (progressing): YES.) Lab Results  Component Value Date   HGBA1C 7.4 (H) 10/30/2021   HGBA1C 6.3 (H) 06/30/2021   HGBA1C 6.2 (H) 06/19/2021   Lab Results  Component Value Date   LDLCALC 126 (H) 10/30/2021   CREATININE 1.75 (H) 10/30/2021  Assessed patient's understanding of A1c goal: <8% Chart reviewed including relevant office notes  and lab results Discussed home blood sugar testing and encouraged to continue testing and recording blood sugar 3 times a day Advised to call PCP with any readings outside of recommended range Discussed diet Trying to avoid fried/fatty foods and simple carbs Discussed foot care and encouraged to check feet daily and to report any sores, cuts, calluses, etc to PCP Encouraged to wear well fitting socks and shoes Encouraged to follow-up with podiatrist as recommended Reminded of appt with  PCP on 5/22 and added appt note to do a detailed foot exam and order diabetic shoes. Patient will talk with PCP about diabetic shoe order at that visit   Hypertension: (Status: Condition stable. Not addressed this visit.) Last practice recorded BP readings:  BP Readings from Last 3 Encounters:  10/30/21 120/69  08/11/21 (!) 147/82  07/20/21 123/74   Reviewed symptoms of hypotension and patient is asymptomatic at this time Encouraged patient to check and record blood pressure daily and to call PCP with any readings outside of recommended range Advised to take blood pressure log to PCP and cardio visits Reviewed upcoming appointments Reviewed medications and encouraged compliance  Patient Goals/Self-Care Activities: Patient will self administer medications as prescribed Patient will attend all scheduled provider appointments Patient will continue to perform ADL's independently Patient will call provider office for new concerns or questions Patient will not use illicit drugs Patient will check and record blood pressure daily and call PCP with any readings outside of recommended range Patient will call RN Care Manager as needed [phone] Patient will check and record blood sugar 3 times daily and call PCP with any readings outside of recommended range Patient will clean and check feet daily for any cuts, sores, calluses, etc and will call PCP with any concerns Patient will wear clean and well fitting socks and shoes  Patient will talk with PCP regarding need for diabetic shoes Patient will talk with CCM team about personal care service application and about food stamps   Plan:Telephone follow up appointment with care management team member scheduled for:  12/29/21 with LCSW The patient has been provided with contact information for the care management team and has been advised to call with any health related questions or concerns.   Chong Sicilian, BSN, RN-BC Embedded Chronic Care  Manager [facility] [place] Management Direct Dial: [phone]

## 2021-12-22 ENCOUNTER — Telehealth: Payer: Self-pay | Admitting: *Deleted

## 2021-12-22 ENCOUNTER — Other Ambulatory Visit: Payer: Self-pay | Admitting: *Deleted

## 2021-12-22 NOTE — Telephone Encounter (Signed)
Go ahead and do PA ?

## 2021-12-22 NOTE — Telephone Encounter (Signed)
They did not give alternative on formulary would you like to do PA? ?

## 2021-12-22 NOTE — Telephone Encounter (Signed)
[employer] approved a TEMP sully of Alendronate Tab 70mg  for the patient.  ?This drug is not on his formulary and will need to be changed for future fills.  ? ?Would you like to change the med (no alternative given)  ?Or would you like to try a PA  ? ?Pt will need something by 12/31/21. ? ?

## 2021-12-22 NOTE — Telephone Encounter (Signed)
I need to know what is on formulary ?

## 2021-12-23 NOTE — Telephone Encounter (Signed)
Alendronate Tab 70mg   PA started  ?(Key: [id number]) ? ?This medication or product is on your plan's list of covered drugs. Prior authorization is not required at this time ?

## 2021-12-29 ENCOUNTER — Ambulatory Visit (INDEPENDENT_AMBULATORY_CARE_PROVIDER_SITE_OTHER): Payer: Medicare Other | Admitting: Licensed Clinical Social Worker

## 2021-12-29 DIAGNOSIS — E1140 Type 2 diabetes mellitus with diabetic neuropathy, unspecified: Secondary | ICD-10-CM

## 2021-12-29 DIAGNOSIS — F411 Generalized anxiety disorder: Secondary | ICD-10-CM

## 2021-12-29 DIAGNOSIS — I10 Essential (primary) hypertension: Secondary | ICD-10-CM

## 2021-12-29 DIAGNOSIS — I480 Paroxysmal atrial fibrillation: Secondary | ICD-10-CM

## 2021-12-29 DIAGNOSIS — J441 Chronic obstructive pulmonary disease with (acute) exacerbation: Secondary | ICD-10-CM

## 2021-12-29 NOTE — Chronic Care Management (AMB) (Signed)
Chronic Care Management    Clinical Social Work Note  12/29/2021 Name: Todd Zhang MRN: [id number] DOB: 22-Sep-1945  Todd Zhang is a 76 y.o. year old male who is a primary care patient of [name], FNP. The CCM team was consulted to assist the patient with chronic disease management and/or care coordination needs related to: [name] .   Engaged with patient by telephone for follow up visit in response to provider referral for social work chronic care management and care coordination services.   Consent to Services:  The patient was given information about Chronic Care Management services, agreed to services, and gave verbal consent prior to initiation of services.  Please see initial visit note for detailed documentation.   Patient agreed to services and consent obtained.   Assessment: Review of patient past medical history, allergies, medications, and health status, including review of relevant consultants reports was performed today as part of a comprehensive evaluation and provision of chronic care management and care coordination services.     SDOH (Social Determinants of Health) assessments and interventions performed:  SDOH Interventions    Flowsheet Row Most Recent Value  SDOH Interventions   Food Insecurity Interventions Other (Comment)  [LCSW gave client the phone number for DSS to call for reapplying over the phone for Food Stamps]  Physical Activity Interventions Other (Comments)  [walking challenges. He uses a cane as needed to help him walk]  Stress Interventions Provide Counseling  [client has stress related to managing medical needs]        Advanced Directives Status: See [employer] application for related entries.  CCM Care Plan  Allergies  Allergen Reactions   Asa [Aspirin] Hives   Penicillins Hives    Hives + vomiting as an adult  - tolerated Ceftriaxone 04/2021    Outpatient Encounter Medications as of 12/29/2021  Medication Sig    alendronate (FOSAMAX) 70 MG tablet TAKE 1 TABLET BY MOUTH ONCE A WEEK. TAKE WITH A FULL GLASS OF WATER ON AN EMPTY STOMACH   atorvastatin (LIPITOR) 40 MG tablet Take 1 tablet (40 mg total) by mouth daily.   benazepril (LOTENSIN) 5 MG tablet Take 1 tablet (5 mg total) by mouth daily.   cetirizine (ZYRTEC) 10 MG tablet Take 10 mg by mouth daily.   clopidogrel (PLAVIX) 75 MG tablet Take 1 tablet (75 mg total) by mouth daily.   collagenase (SANTYL) ointment Apply 1 application topically daily.   fenofibrate (TRICOR) 145 MG tablet Take 1 tablet (145 mg total) by mouth daily.   gabapentin (NEURONTIN) 300 MG capsule Take 1 capsule (300 mg total) by mouth 4 (four) times daily. TAKE 1 CAPSULE BY MOUTH THREE TIMES DAILY FOR PAIN   glucose blood (ACCU-CHEK AVIVA PLUS) test strip CHECK BLOOD SUGAR FOUR TIMES DAILY Dx E11.40   insulin regular (HUMULIN R) 100 units/mL injection INJECT 10 UNITS INTO THE SKIN THREE TIMES DAILY BEFORE MEALS   Insulin Syringe-Needle U-100 (GLOBAL EASY GLIDE INSULIN SYR) 31G X 5/16" 0.3 ML MISC USE WITH INSULIN EVERY DAY Dx E11.40   metFORMIN (GLUCOPHAGE) 500 MG tablet Take 1 tablet (500 mg total) by mouth 2 (two) times daily.   metoprolol succinate (TOPROL-XL) 25 MG 24 hr tablet Take 1 tablet (25 mg total) by mouth daily.   Multiple Vitamin (MULTIVITAMIN) tablet Take 1 tablet by mouth daily.   mupirocin ointment (BACTROBAN) 2 % Apply 1 application topically daily. Left Great Toe   pantoprazole (PROTONIX) 40 MG tablet Take 1 tablet (40 mg  total) by mouth daily.   Rivaroxaban (XARELTO) 15 MG TABS tablet Take 1 tablet (15 mg total) by mouth daily with supper.   tamsulosin (FLOMAX) 0.4 MG CAPS capsule Take 1 capsule (0.4 mg total) by mouth daily.   triamcinolone cream (KENALOG) 0.5 % APPLY TO THE AFFECTED AREA(S) THREE TIMES DAILY   vitamin B-12 (CYANOCOBALAMIN) 500 MCG tablet Take 500 mcg by mouth daily.   No facility-administered encounter medications on file as of 12/29/2021.     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 09/13/2020   Pure hypercholesterolemia 07/16/2020   Peripheral artery disease ([place])    COPD exacerbation ([place]) 05/14/2019   DDD (degenerative disc disease), cervical 11/06/2018   Generalized anxiety disorder 11/29/2017   Benign prostatic hyperplasia with incomplete bladder emptying 11/28/2017   Gastroesophageal reflux disease without esophagitis 09/07/2017   Essential hypertension 09/01/2017   Onychomycosis 09/01/2017   History of cardioversion 08/28/2015   Type 2 diabetes mellitus with diabetic neuropathy, with long-term current use of insulin ([place]) 06/19/2014   Back pain 06/19/2014   Neck pain 06/19/2014    Conditions to be addressed/monitored: monitor housing needs of client. Monitor in home care needs of client  Care Plan : LCSW Care plan  Updates made by Katha Cabal, LCSW since 12/29/2021 12:00 AM     Problem: Coping Skills (General Plan of Care)      Goal: Coping Skills Enhanced: manage housing needs of client; manage home care needs   Start Date: 11/19/2021  Expected End Date: 03/29/2022  This Visit's Progress: On track  Recent Progress: On track  Priority: Medium  Note:   Current barriers:   Patient in need of assistance with connecting to community resources for possible help with housing needs of client Patient is unable to independently navigate community resource options without care coordination support Mobility issues and pain issues Decreased  energy In home care needs  Clinical Goals:   LCSW to call client in next 30 days to discuss housing needs and housing situation of client Client to call RNCM or LCSW as needed in next 30 days for CCM program support LCSW to communicate with client in next 30 days to discuss in home care support for client Client to call RNCM as needed in next 30 days for CCM nursing support  Clinical Interventions:  Collaboration with [name], [place] regarding development and update of comprehensive plan of care as evidenced by provider attestation and co-signature Discussed with client his current needs, including sleeping issues of client, appetite of client, and pain issues of client.  Client said he has pain issues; he said sometimes these pain issues keep him from sleeping well at night Reviewed ambulation needs of client. Client said he is walking without a device at present. He talked of Diabetic Shoes. Client has appointment in May of 2023 with [name] FNP at [facility]. He plans to talk with Shelah Lewandowsky at appointment about order for Diabetic Shoes for client.   LCSW collaborated with [employer] today  about needs of client Reviewed Food Stamps needs of client. His Food Stamps benefit expired in January of 2023. LCSW called [place] today and inquired about Food Stamps status for client. Representative said client would need to reapply for Food Stamps.  Number for client to use to reapply for Food Stamps is:  [id number], extension [phone].  LCSW gave [place] this number and extension today and encouraged him to call DSS to apply over the phone for Food Stamps benefit. Client said he would call number to reapply for Food Stamps benefit. Provided counseling support for client Encouraged [place] to call RNCM as needed for CCM nursing support Encouraged A[place] to call LCSW as needed for CCM social work support Discussed medication procurement for client. Client has applied for PCS services.   [facility] has not contacted client as yet.  LCSW called [facility] today and waited for 20 minutes via phone but was not able to speak with representative of client needs. Phone answering service did not take LCSW return number correctly and thus LCSW could not ask for return call. This information was shared with RNCM Chong Sicilian and with client, Luv Raska.  Patient Coping Skills: Has support from his sister, Nonie Hoyer Has no transport needs Attends scheduled medical appointments  Patient deficits:   Mobility issues Pain issues Depression issues  Patient Goals:   Patient will call RNCM or LCSW as needed in next 30 days for CCM support Patient will attend all scheduled client medical appointments in next 30 days Patient will communicate regularly with sister, Nonie Hoyer, in next 30 days to discuss ongoing needs of client -  Follow Up Plan: LCSW to call client on 02/16/22 at 3:00 PM to assess client needs at that time      [name].Jeroline Wolbert MSW, [place] [employer] representative [facility] Care Management [phone]

## 2021-12-29 NOTE — Patient Instructions (Addendum)
Visit Information ? ?Patient Goals: Protect My Health (Patient). Manage housing needs of client. Client to talk with [facility] nursing staff about breathing challenges of client. ?Manage mobility needs ? ?Timeframe:  Short-Term Goal ?Priority:  Medium ?Progress:  On Track ?Start Date:         11/19/21                 ?Expected End Date:       03/29/22            ? ?Follow Up Date  02/16/22 at 3:00 PM ?  ?Protect My Health (Patient)  Manage housing needs of client ; client to talk with [facility] nursing staff about breathing challenges of client. Manage mobility needs ?  ?Why is this important?   ?Screening tests can find diseases early when they are easier to treat.  ?Your doctor or nurse will talk with you about which tests are important for you.  ?Getting shots for common diseases like the flu and shingles will help prevent them.   ?  ?Patient Coping Skills: ?Attends scheduled medical appointments ?No transport needs ?Completes ADLs daily ? ?Patient deficits:   ?Mobility issues ?Housing needs ? ?Patient Goals:  ? ?Patient will call RNCM or LCSW as needed in next 30 days for CCM support ?Patient will attend all scheduled client medical appointments in next 30 days ?Patient will communicate regularly with his brother, Nathaneil Canary, in next 30 days to discuss ongoing needs of client ?-  ?Follow Up Plan: LCSW to call client on 02/16/22 at 3:00 PM to assess client needs at that time   ? ?[name].Willmer Fellers MSW, LCSW ?Licensed Clinical Social Worker ?[place] Management ?[phone]  ?

## 2022-01-10 DIAGNOSIS — Z794 Long term (current) use of insulin: Secondary | ICD-10-CM

## 2022-01-10 DIAGNOSIS — E1140 Type 2 diabetes mellitus with diabetic neuropathy, unspecified: Secondary | ICD-10-CM

## 2022-01-10 DIAGNOSIS — I480 Paroxysmal atrial fibrillation: Secondary | ICD-10-CM

## 2022-01-10 DIAGNOSIS — J441 Chronic obstructive pulmonary disease with (acute) exacerbation: Secondary | ICD-10-CM

## 2022-01-10 DIAGNOSIS — I10 Essential (primary) hypertension: Secondary | ICD-10-CM

## 2022-01-12 ENCOUNTER — Ambulatory Visit (INDEPENDENT_AMBULATORY_CARE_PROVIDER_SITE_OTHER): Payer: Medicare Other | Admitting: Licensed Clinical Social Worker

## 2022-01-12 DIAGNOSIS — F411 Generalized anxiety disorder: Secondary | ICD-10-CM

## 2022-01-12 DIAGNOSIS — E1140 Type 2 diabetes mellitus with diabetic neuropathy, unspecified: Secondary | ICD-10-CM

## 2022-01-12 DIAGNOSIS — J441 Chronic obstructive pulmonary disease with (acute) exacerbation: Secondary | ICD-10-CM

## 2022-01-12 DIAGNOSIS — I480 Paroxysmal atrial fibrillation: Secondary | ICD-10-CM

## 2022-01-12 DIAGNOSIS — I739 Peripheral vascular disease, unspecified: Secondary | ICD-10-CM

## 2022-01-12 DIAGNOSIS — I10 Essential (primary) hypertension: Secondary | ICD-10-CM

## 2022-01-12 NOTE — Chronic Care Management (AMB) (Signed)
Chronic Care Management    Clinical Social Work Note  01/12/2022 Name: Todd Zhang MRN: [id number] DOB: 1945/12/06  Todd Zhang is a 76 y.o. year old male who is a primary care patient of [name], [place]. The CCM team was consulted to assist the patient with chronic disease management and/or care coordination needs related to: [employer] .   Engaged with patient by telephone for follow up visit in response to provider referral for social work chronic care management and care coordination services.   Consent to Services:  The patient was given information about Chronic Care Management services, agreed to services, and gave verbal consent prior to initiation of services.  Please see initial visit note for detailed documentation.   Patient agreed to services and consent obtained.   Assessment: Review of patient past medical history, allergies, medications, and health status, including review of relevant consultants reports was performed today as part of a comprehensive evaluation and provision of chronic care management and care coordination services.     SDOH (Social Determinants of Health) assessments and interventions performed:  SDOH Interventions    Flowsheet Row Most Recent Value  SDOH Interventions   Physical Activity Interventions Other (Comments)  [client has some walking challenges]  Stress Interventions Provide Counseling  [client has stress related to managing medical needs]  Depression Interventions/Treatment  Counseling        Advanced Directives Status: See Vynca application for related entries.  CCM Care Plan  Allergies  Allergen Reactions   Asa [Aspirin] Hives   Penicillins Hives    Hives + vomiting as an adult  - tolerated Ceftriaxone 04/2021    Outpatient Encounter Medications as of 01/12/2022  Medication Sig   alendronate (FOSAMAX) 70 MG tablet TAKE 1 TABLET BY MOUTH ONCE A WEEK. TAKE WITH A FULL GLASS OF WATER ON AN EMPTY STOMACH    atorvastatin (LIPITOR) 40 MG tablet Take 1 tablet (40 mg total) by mouth daily.   benazepril (LOTENSIN) 5 MG tablet Take 1 tablet (5 mg total) by mouth daily.   cetirizine (ZYRTEC) 10 MG tablet Take 10 mg by mouth daily.   clopidogrel (PLAVIX) 75 MG tablet Take 1 tablet (75 mg total) by mouth daily.   collagenase (SANTYL) ointment Apply 1 application topically daily.   fenofibrate (TRICOR) 145 MG tablet Take 1 tablet (145 mg total) by mouth daily.   gabapentin (NEURONTIN) 300 MG capsule Take 1 capsule (300 mg total) by mouth 4 (four) times daily. TAKE 1 CAPSULE BY MOUTH THREE TIMES DAILY FOR PAIN   glucose blood (ACCU-CHEK AVIVA PLUS) test strip CHECK BLOOD SUGAR FOUR TIMES DAILY Dx E11.40   insulin regular (HUMULIN R) 100 units/mL injection INJECT 10 UNITS INTO THE SKIN THREE TIMES DAILY BEFORE MEALS   Insulin Syringe-Needle U-100 (GLOBAL EASY GLIDE INSULIN SYR) 31G X 5/16" 0.3 ML MISC USE WITH INSULIN EVERY DAY Dx E11.40   metFORMIN (GLUCOPHAGE) 500 MG tablet Take 1 tablet (500 mg total) by mouth 2 (two) times daily.   metoprolol succinate (TOPROL-XL) 25 MG 24 hr tablet Take 1 tablet (25 mg total) by mouth daily.   Multiple Vitamin (MULTIVITAMIN) tablet Take 1 tablet by mouth daily.   mupirocin ointment (BACTROBAN) 2 % Apply 1 application topically daily. Left Great Toe   pantoprazole (PROTONIX) 40 MG tablet Take 1 tablet (40 mg total) by mouth daily.   Rivaroxaban (XARELTO) 15 MG TABS tablet Take 1 tablet (15 mg total) by mouth daily with supper.   tamsulosin (FLOMAX)  0.4 MG CAPS capsule Take 1 capsule (0.4 mg total) by mouth daily.   triamcinolone cream (KENALOG) 0.5 % APPLY TO THE AFFECTED AREA(S) THREE TIMES DAILY   vitamin B-12 (CYANOCOBALAMIN) 500 MCG tablet Take 500 mcg by mouth daily.   No facility-administered encounter medications on file as of 01/12/2022.    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 09/13/2020   Pure hypercholesterolemia 07/16/2020   Peripheral artery disease ([place])    COPD exacerbation ([place]) 05/14/2019   DDD (degenerative disc disease), cervical 11/06/2018   Generalized anxiety disorder 11/29/2017   Benign prostatic hyperplasia with incomplete bladder emptying 11/28/2017   Gastroesophageal reflux disease without esophagitis 09/07/2017   Essential hypertension 09/01/2017   Onychomycosis 09/01/2017   History of cardioversion 08/28/2015   Type 2 diabetes mellitus with diabetic neuropathy, with long-term current use of insulin ([place]) 06/19/2014   Back pain 06/19/2014   Neck pain 06/19/2014    Conditions to be addressed/monitored: monitor the in home care needs of client  Care Plan : LCSW Care plan  Updates made by Katha Cabal, LCSW since 01/12/2022 12:00 AM     Problem: Coping Skills (General Plan of Care)      Goal: Coping Skills Enhanced: manage housing needs of client; manage in home care needs of client   Start Date: 11/19/2021  Expected End Date: 04/09/2022  This Visit's Progress: On track  Recent Progress: On track  Priority: Medium  Note:   Current barriers:   Patient in need of assistance with connecting to community resources for possible help with in home care needs of client Patient is unable to independently navigate community resource options without care coordination support Mobility issues and pain issues Decreased energy  Clinical Goals:   LCSW to call client in next 30 days to discuss housing needs and housing situation of client Client to call RNCM or  LCSW as needed in next 30 days for CCM program support LCSW to communicate with client in next 30 days to discuss in home care support for client Client to call RNCM as needed in next 30 days for CCM nursing support  Clinical Interventions:  Collaboration with [name], [place] regarding development and update of comprehensive plan of care as evidenced by provider attestation and co-signature Discussed with client his current needs, including sleeping issues of client, appetite of client, and pain issues of client.  Client said he has pain issues; he said sometimes these pain issues keep him from sleeping well at night Reviewed ambulation needs of client. Client said he is walking without a device at present. He talked of Diabetic Shoes. Client has appointment on Feb 01, 2022  at 9:45 AM with [name] FNP at [facility].  He plans to talk with Shelah Lewandowsky at appointment about order for Diabetic Shoes for client.   LCSW collaborated with [place] today about PCS application status of client Reviewed Food Stamps needs of client. His Food Stamps benefit expired in January of 2023. Bertis said he called  representative at [place] recently regarding Food Stamps benefit.  He said DSS representative was looking into situation related to Food Stamps benefit for him.   Provided counseling support for client Encouraged [place] to call [facility] as needed for CCM nursing support Discussed PCS application process for client.  LCSW informed client that LCSW called [facility] today and spoke with representative about PCS application status. [place] representative said [facility] never got PCS application on client.  LCSW shared this information with [place] today, as told by representative of [facility]. RNCM Chong Sicilian is looking into this matter and will talk with staff at [facility] to see if application could be re-faxed for client to [facility] (fax [phone]).   Discussed  medication procurement for client. Discussed skin care needs of client  Patient Coping Skills: Has support from his sister, Nonie Hoyer Has no transport needs Attends scheduled medical appointments  Patient deficits:   Mobility issues Pain issues Depression issues  Patient Goals:   Patient will call RNCM or LCSW as needed in next 30 days for CCM support Patient will attend all scheduled client medical appointments in next 30 days Patient will communicate regularly with sister, Nonie Hoyer, in next 30 days to discuss ongoing needs of client -  Follow Up Plan: LCSW to call client on 02/16/22 at 3:00 PM to assess client needs at that time       Todd Zhang MSW, [place] representative [facility] Care Management [phone]

## 2022-01-12 NOTE — Patient Instructions (Addendum)
Visit Information ? ?Patient Goals:  Protect My Health (Patient). Manage housing needs of client; client to talk with [facility] nursing staff about breathing challenges ?of client. Manage mobility needs. ? ?Timeframe:  Short-Term Goal ?Priority:  Medium ?Progress:  On Track ?Start Date:         11/19/21                 ?Expected End Date:       04/09/22              ? ?Follow Up Date  02/16/22 at 3:00 PM ?  ?Protect My Health (Patient)  Manage housing needs of client ; client to talk with [facility] nursing staff about breathing challenges of client. Manage mobility needs ?  ?Why is this important?   ?Screening tests can find diseases early when they are easier to treat.  ?Your doctor or nurse will talk with you about which tests are important for you.  ?Getting shots for common diseases like the flu and shingles will help prevent them.   ?  ?Patient Coping Skills: ?Attends scheduled medical appointments ?No transport needs ?Completes ADLs daily ? ?Patient deficits:   ?Mobility issues ?Housing needs ? ?Patient Goals:  ? ?Patient will call RNCM or LCSW as needed in next 30 days for CCM support ?Patient will attend all scheduled client medical appointments in next 30 days ?Patient will communicate regularly with his brother, Riley Lam, in next 30 days to discuss ongoing needs of client ?-  ?Follow Up Plan: LCSW to call client on 02/16/22 at 3:00 PM to assess client needs at that time. ? ?Kelton Pillar.Analiza Cowger MSW, LCSW ?Licensed Clinical Social Worker ?[facility] Care Management ?[phone] ?

## 2022-02-01 ENCOUNTER — Ambulatory Visit: Payer: Medicare Other | Admitting: Nurse Practitioner

## 2022-02-04 ENCOUNTER — Ambulatory Visit (INDEPENDENT_AMBULATORY_CARE_PROVIDER_SITE_OTHER): Payer: Medicare Other | Admitting: Nurse Practitioner

## 2022-02-04 ENCOUNTER — Encounter: Payer: Self-pay | Admitting: Nurse Practitioner

## 2022-02-04 VITALS — BP 109/63 | HR 93 | Temp 97.2°F | Resp 20 | Ht 72.0 in | Wt 212.0 lb

## 2022-02-04 DIAGNOSIS — N401 Enlarged prostate with lower urinary tract symptoms: Secondary | ICD-10-CM

## 2022-02-04 DIAGNOSIS — G8929 Other chronic pain: Secondary | ICD-10-CM

## 2022-02-04 DIAGNOSIS — I4891 Unspecified atrial fibrillation: Secondary | ICD-10-CM | POA: Diagnosis not present

## 2022-02-04 DIAGNOSIS — M5030 Other cervical disc degeneration, unspecified cervical region: Secondary | ICD-10-CM

## 2022-02-04 DIAGNOSIS — I739 Peripheral vascular disease, unspecified: Secondary | ICD-10-CM | POA: Diagnosis not present

## 2022-02-04 DIAGNOSIS — N1832 Chronic kidney disease, stage 3b: Secondary | ICD-10-CM

## 2022-02-04 DIAGNOSIS — I10 Essential (primary) hypertension: Secondary | ICD-10-CM | POA: Diagnosis not present

## 2022-02-04 DIAGNOSIS — J411 Mucopurulent chronic bronchitis: Secondary | ICD-10-CM | POA: Diagnosis not present

## 2022-02-04 DIAGNOSIS — R3914 Feeling of incomplete bladder emptying: Secondary | ICD-10-CM

## 2022-02-04 DIAGNOSIS — F411 Generalized anxiety disorder: Secondary | ICD-10-CM

## 2022-02-04 DIAGNOSIS — K219 Gastro-esophageal reflux disease without esophagitis: Secondary | ICD-10-CM

## 2022-02-04 DIAGNOSIS — M545 Low back pain, unspecified: Secondary | ICD-10-CM

## 2022-02-04 DIAGNOSIS — Z794 Long term (current) use of insulin: Secondary | ICD-10-CM

## 2022-02-04 DIAGNOSIS — E1140 Type 2 diabetes mellitus with diabetic neuropathy, unspecified: Secondary | ICD-10-CM

## 2022-02-04 DIAGNOSIS — E7800 Pure hypercholesterolemia, unspecified: Secondary | ICD-10-CM

## 2022-02-04 LAB — BAYER DCA HB A1C WAIVED: HB A1C (BAYER DCA - WAIVED): 7.1 % — ABNORMAL HIGH (ref 4.8–5.6)

## 2022-02-04 MED ORDER — RIVAROXABAN 15 MG PO TABS: 15.0000 mg | ORAL_TABLET | Freq: Every day | ORAL | 1 refills | Status: DC

## 2022-02-04 MED ORDER — BENAZEPRIL HCL 5 MG PO TABS: 5.0000 mg | ORAL_TABLET | Freq: Every day | ORAL | 1 refills | Status: DC

## 2022-02-04 MED ORDER — TAMSULOSIN HCL 0.4 MG PO CAPS: 0.4000 mg | ORAL_CAPSULE | Freq: Every day | ORAL | 1 refills | Status: DC

## 2022-02-04 MED ORDER — METFORMIN HCL 500 MG PO TABS: 500.0000 mg | ORAL_TABLET | Freq: Two times a day (BID) | ORAL | 1 refills | Status: DC

## 2022-02-04 MED ORDER — ATORVASTATIN CALCIUM 40 MG PO TABS: 40.0000 mg | ORAL_TABLET | Freq: Every day | ORAL | 1 refills | Status: DC

## 2022-02-04 MED ORDER — INSULIN REGULAR HUMAN 100 UNIT/ML IJ SOLN: INTRAMUSCULAR | 1 refills | Status: DC

## 2022-02-04 MED ORDER — FENOFIBRATE 145 MG PO TABS: 145.0000 mg | ORAL_TABLET | Freq: Every day | ORAL | 1 refills | Status: DC

## 2022-02-04 MED ORDER — GABAPENTIN 300 MG PO CAPS: 300.0000 mg | ORAL_CAPSULE | Freq: Four times a day (QID) | ORAL | 5 refills | Status: DC

## 2022-02-04 MED ORDER — METOPROLOL SUCCINATE ER 25 MG PO TB24: 25.0000 mg | ORAL_TABLET | Freq: Every day | ORAL | 1 refills | Status: DC

## 2022-02-04 MED ORDER — CLOPIDOGREL BISULFATE 75 MG PO TABS: 75.0000 mg | ORAL_TABLET | Freq: Every day | ORAL | 1 refills | Status: DC

## 2022-02-04 MED ORDER — PANTOPRAZOLE SODIUM 40 MG PO TBEC: 40.0000 mg | DELAYED_RELEASE_TABLET | Freq: Every day | ORAL | 1 refills | Status: DC

## 2022-02-04 NOTE — Progress Notes (Signed)
Subjective:    Patient ID: Todd Zhang, male    DOB: [birth year], 76 y.o.   MRN: [id number]   Chief Complaint: medical management of chronic issues     HPI:  Todd Zhang is a 76 y.o. who identifies as a male who was assigned male at birth.   Social history: Lives with: by hisself- he has no family that checks on him very  often Work history: retired   Scientist, forensic in today for follow up of the following chronic medical issues:  1. Essential hypertension No c/o chest pai, sob or headache. Doesnot check blood pressure at home. BP Readings from Last 3 Encounters:  10/30/21 120/69  08/11/21 (!) 147/82  07/20/21 123/74     2. Atrial fibrillation with RVR (HCC) Denis palpitations or heart racing. Ison xeralto daily and denies any bleeding issues.  3. Peripheral artery disease ([place]) Has not seen cardiology in several years  4. COPD  ([place]) Is on no inhalers and is doing well.  5. Gastroesophageal reflux disease without esophagitis Is on protonix daily and is doing well.  6. Type 2 diabetes mellitus with diabetic neuropathy, with long-term current use of insulin (HCC) Fasting blood sugars aare running around 140-160. Has had some readings as high as 300. Lab Results  Component Value Date   HGBA1C 7.4 (H) 10/30/2021     7. Pure hypercholesterolemia Does not watch diet and does no dedicated exercise.takes lipitor and tricor daily. Lab Results  Component Value Date   CHOL 197 10/30/2021   HDL 44 10/30/2021   LDLCALC 126 (H) 10/30/2021   TRIG 154 (H) 10/30/2021   CHOLHDL 4.5 10/30/2021     8. Generalized anxiety disorder Is on no prescription meds    02/04/2022   11:33 AM 10/30/2021    8:48 AM 06/19/2021    3:02 PM 01/28/2021   11:47 AM  GAD 7 : Generalized Anxiety Score  Nervous, Anxious, on Edge 0 0 0 1  Control/stop worrying 0 0 0 1  Worry too much - different things 0 0 0 0  Trouble relaxing 0 0 0 0  Restless 0 0 0 0  Easily annoyed or irritable 0 0 0 1   Afraid - awful might happen 0 0 0 0  Total GAD 7 Score 0 0 0 3  Anxiety Difficulty Not difficult at all  Not difficult at all Somewhat difficult      9. Benign prostatic hyperplasia with incomplete bladder emptying Denies any voiding issues. Lab Results  Component Value Date   PSA1 0.7 10/30/2021   PSA1 0.5 08/24/2019   PSA1 0.6 11/28/2017      10. Stage 3b chronic kidney disease ([place]) Lab Results  Component Value Date   CREATININE 1.75 (H) 10/30/2021     11. DDD (degenerative disc disease), cervical Chronic back pain. Rates pain 7/10 currently. He does not take anything for this   New complaints: None today  Allergies  Allergen Reactions   Asa [Aspirin] Hives   Penicillins Hives    Hives + vomiting as an adult  - tolerated Ceftriaxone 04/2021   Outpatient Encounter Medications as of 02/04/2022  Medication Sig   alendronate (FOSAMAX) 70 MG tablet TAKE 1 TABLET BY MOUTH ONCE A WEEK. TAKE WITH A FULL GLASS OF WATER ON AN EMPTY STOMACH   atorvastatin (LIPITOR) 40 MG tablet Take 1 tablet (40 mg total) by mouth daily.   benazepril (LOTENSIN) 5 MG tablet Take 1 tablet (5 mg total) by mouth  daily.   cetirizine (ZYRTEC) 10 MG tablet Take 10 mg by mouth daily.   clopidogrel (PLAVIX) 75 MG tablet Take 1 tablet (75 mg total) by mouth daily.   collagenase (SANTYL) ointment Apply 1 application topically daily.   fenofibrate (TRICOR) 145 MG tablet Take 1 tablet (145 mg total) by mouth daily.   gabapentin (NEURONTIN) 300 MG capsule Take 1 capsule (300 mg total) by mouth 4 (four) times daily. TAKE 1 CAPSULE BY MOUTH THREE TIMES DAILY FOR PAIN   glucose blood (ACCU-CHEK AVIVA PLUS) test strip CHECK BLOOD SUGAR FOUR TIMES DAILY Dx E11.40   insulin regular (HUMULIN R) 100 units/mL injection INJECT 10 UNITS INTO THE SKIN THREE TIMES DAILY BEFORE MEALS   Insulin Syringe-Needle U-100 (GLOBAL EASY GLIDE INSULIN SYR) 31G X 5/16" 0.3 ML MISC USE WITH INSULIN EVERY DAY Dx E11.40   metFORMIN  (GLUCOPHAGE) 500 MG tablet Take 1 tablet (500 mg total) by mouth 2 (two) times daily.   metoprolol succinate (TOPROL-XL) 25 MG 24 hr tablet Take 1 tablet (25 mg total) by mouth daily.   Multiple Vitamin (MULTIVITAMIN) tablet Take 1 tablet by mouth daily.   mupirocin ointment (BACTROBAN) 2 % Apply 1 application topically daily. Left Great Toe   pantoprazole (PROTONIX) 40 MG tablet Take 1 tablet (40 mg total) by mouth daily.   Rivaroxaban (XARELTO) 15 MG TABS tablet Take 1 tablet (15 mg total) by mouth daily with supper.   tamsulosin (FLOMAX) 0.4 MG CAPS capsule Take 1 capsule (0.4 mg total) by mouth daily.   triamcinolone cream (KENALOG) 0.5 % APPLY TO THE AFFECTED AREA(S) THREE TIMES DAILY   vitamin B-12 (CYANOCOBALAMIN) 500 MCG tablet Take 500 mcg by mouth daily.   No facility-administered encounter medications on file as of 02/04/2022.    Past Surgical History:  Procedure Laterality Date   ABDOMINAL AORTOGRAM W/LOWER EXTREMITY N/A 04/30/2021   Procedure: ABDOMINAL AORTOGRAM W/LOWER EXTREMITY;  Surgeon: Cherre Robins, MD;  Location: [place] [facility];  Service: Cardiovascular;  Laterality: N/A;   AMPUTATION TOE     AMPUTATION TOE Left 05/04/2021   Procedure: LEFT GREAT TOE AMPUTATION;  Surgeon: Marty Heck, MD;  Location: [place];  Service: Vascular;  Laterality: Left;   BYPASS GRAFT FEMORAL-PERONEAL Left 05/04/2021   Procedure: LEFT COMMON FEMORAL-TIBIOPERONEAL TRUNK BYPASS;  Surgeon: Marty Heck, MD;  Location: [place];  Service: Vascular;  Laterality: Left;   ENDARTERECTOMY FEMORAL Left 05/04/2021   Procedure: LEFT COMMON FEMORAL ENDARTERECTOMY WITH BOVINE PATCH;  Surgeon: Marty Heck, MD;  Location: [place];  Service: Vascular;  Laterality: Left;   ENDARTERECTOMY TIBIOPERONEAL Left 05/04/2021   Procedure: LEFT BELOW KNEE POPITEAL WITH  TIBIOPERONEAL ENDARTERECTOMY;  Surgeon: Marty Heck, MD;  Location: [place];  Service: Vascular;  Laterality: Left;   ENDOVEIN  HARVEST OF GREATER SAPHENOUS VEIN  05/04/2021   Procedure: HARVEST OF LEFT GREATER SAPHENOUS VEIN;  Surgeon: Marty Heck, MD;  Location: [place];  Service: Vascular;;   INSERTION OF ILIAC STENT Left 05/04/2021   Procedure: LEFT COMMON ILLIAC ATERY ANGIOPLASTY WITH STENT;  Surgeon: Marty Heck, MD;  Location: [place];  Service: Vascular;  Laterality: Left;   INTRAOPERATIVE ARTERIOGRAM Left 05/04/2021   Procedure: LEFT ARTERY ARTERIOGRAM;  Surgeon: Marty Heck, MD;  Location: [place];  Service: Vascular;  Laterality: Left;   IRRIGATION AND DEBRIDEMENT FOOT Left 05/04/2021   Procedure: IRRIGATION AND DEBRIDEMENT FOOT;  Surgeon: Marty Heck, MD;  Location: [place];  Service: Vascular;  Laterality: Left;   KNEE SURGERY Left    NOSE SURGERY     TEE WITHOUT CARDIOVERSION N/A 05/01/2021   Procedure: TRANSESOPHAGEAL ECHOCARDIOGRAM (TEE);  Surgeon: Skeet Latch, MD;  [facility];  Service: Cardiovascular;  Laterality: N/A;    Family History  Problem Relation Age of Onset   Stroke Mother    Heart disease Mother        CABG   Cancer Father       Controlled substance contract: n/a     Review of Systems  Constitutional:  Negative for diaphoresis.  Eyes:  Negative for pain.  Respiratory:  Negative for shortness of breath.   Cardiovascular:  Negative for chest pain, palpitations and leg swelling.  Gastrointestinal:  Negative for abdominal pain.  Endocrine: Negative for polydipsia.  Musculoskeletal:  Positive for back pain (chronic).  Skin:  Negative for rash.  Neurological:  Negative for dizziness, weakness and headaches.  Hematological:  Does not bruise/bleed easily.  All other systems reviewed and are negative.     Objective:   Physical Exam Vitals and nursing note reviewed.  Constitutional:      Appearance: Normal appearance. He is well-developed.  HENT:     Head: Normocephalic.     Nose: Nose normal.     Mouth/Throat:     Mouth: Mucous  membranes are moist.     Pharynx: Oropharynx is clear.  Eyes:     Pupils: Pupils are equal, round, and reactive to light.  Neck:     Thyroid: No thyroid mass or thyromegaly.     Vascular: No carotid bruit or JVD.     Trachea: Phonation normal.  Cardiovascular:     Rate and Rhythm: Normal rate and regular rhythm.  Pulmonary:     Effort: Pulmonary effort is normal. No respiratory distress.     Breath sounds: Normal breath sounds.  Abdominal:     General: Bowel sounds are normal.     Palpations: Abdomen is soft.     Tenderness: There is no abdominal tenderness.  Musculoskeletal:        General: Normal range of motion.     Cervical back: Normal range of motion and neck supple.     Comments: Rises slowly from sitting to standing Gait slow and steady.   Lymphadenopathy:     Cervical: No cervical adenopathy.  Skin:    General: Skin is warm and dry.  Neurological:     Mental Status: He is alert and oriented to person, place, and time.  Psychiatric:        Behavior: Behavior normal.        Thought Content: Thought content normal.        Judgment: Judgment normal.    BP 109/63   Pulse 93   Temp (!) 97.2 F (36.2 C) (Temporal)   Resp 20   Ht 6' (1.829 m)   Wt 212 lb (96.2 kg)   SpO2 95%   BMI 28.75 kg/m        Assessment & Plan:   Todd Zhang comes in today with chief complaint of Medical Management of Chronic Issues   Diagnosis and orders addressed:  1. Essential hypertension Low sodium diet - CBC with Differential/Platelet - CMP14+EGFR - benazepril (LOTENSIN) 5 MG tablet; Take 1 tablet (5 mg total) by mouth daily.  Dispense: 90 tablet; Refill: 1  2. Atrial fibrillation with RVR (HCC) Reportheart racing - Rivaroxaban (XARELTO) 15 MG TABS tablet; Take 1 tablet (15 mg total) by mouth  daily with supper.  Dispense: 90 tablet; Refill: 1 - metoprolol succinate (TOPROL-XL) 25 MG 24 hr tablet; Take 1 tablet (25 mg total) by mouth daily.  Dispense: 90 tablet; Refill:  1  3. Peripheral artery disease (HCC) Elevate legs when sitting - Rivaroxaban (XARELTO) 15 MG TABS tablet; Take 1 tablet (15 mg total) by mouth daily with supper.  Dispense: 90 tablet; Refill: 1 - clopidogrel (PLAVIX) 75 MG tablet; Take 1 tablet (75 mg total) by mouth daily.  Dispense: 90 tablet; Refill: 1  4. Mucopurulent chronic bronchitis ([place])  5. Gastroesophageal reflux disease without esophagitis Avoid spicy foods Do not eat 2 hours prior to bedtime - pantoprazole (PROTONIX) 40 MG tablet; Take 1 tablet (40 mg total) by mouth daily.  Dispense: 90 tablet; Refill: 1  6. Type 2 diabetes mellitus with diabetic neuropathy, with long-term current use of insulin (HCC) Continue to watch carbs in diet - Bayer DCA Hb A1c Waived - metFORMIN (GLUCOPHAGE) 500 MG tablet; Take 1 tablet (500 mg total) by mouth 2 (two) times daily.  Dispense: 180 tablet; Refill: 1 - insulin regular (HUMULIN R) 100 units/mL injection; INJECT 10 UNITS INTO THE SKIN THREE TIMES DAILY BEFORE MEALS  Dispense: 30 mL; Refill: 1  7. Pure hypercholesterolemia Low fat diet - Lipid panel - atorvastatin (LIPITOR) 40 MG tablet; Take 1 tablet (40 mg total) by mouth daily.  Dispense: 90 tablet; Refill: 1 - fenofibrate (TRICOR) 145 MG tablet; Take 1 tablet (145 mg total) by mouth daily.  Dispense: 90 tablet; Refill: 1  8. Generalized anxiety disorder Stress management  9. Benign prostatic hyperplasia with incomplete bladder emptying Report any voiding issues - tamsulosin (FLOMAX) 0.4 MG CAPS capsule; Take 1 capsule (0.4 mg total) by mouth daily.  Dispense: 90 capsule; Refill: 1  10. Stage 3b chronic kidney disease ([place]) Labs pending  11. DDD (degenerative disc disease), cervical Moist heat rest - gabapentin (NEURONTIN) 300 MG capsule; Take 1 capsule (300 mg total) by mouth 4 (four) times daily. TAKE 1 CAPSULE BY MOUTH THREE TIMES DAILY FOR PAIN  Dispense: 120 capsule; Refill: 5  12. Chronic low back pain without  sciatica, unspecified back pain laterality - gabapentin (NEURONTIN) 300 MG capsule; Take 1 capsule (300 mg total) by mouth 4 (four) times daily. TAKE 1 CAPSULE BY MOUTH THREE TIMES DAILY FOR PAIN  Dispense: 120 capsule; Refill: 5   Labs pending Health Maintenance reviewed Diet and exercise encouraged  Follow up plan: 3 months   [name] [name], FNP

## 2022-02-04 NOTE — Patient Instructions (Signed)

## 2022-02-05 LAB — LIPID PANEL
Chol/HDL Ratio: 5.1 ratio — ABNORMAL HIGH (ref 0.0–5.0)
Cholesterol, Total: 200 mg/dL — ABNORMAL HIGH (ref 100–199)
HDL: 39 mg/dL — ABNORMAL LOW (ref >39)
LDL Chol Calc (NIH): 127 mg/dL — ABNORMAL HIGH (ref 0–99)
Triglycerides: 190 mg/dL — ABNORMAL HIGH (ref 0–149)
VLDL Cholesterol Cal: 34 mg/dL (ref 5–40)

## 2022-02-05 LAB — CBC WITH DIFFERENTIAL/PLATELET
Basophils Absolute: 0 10*3/uL (ref 0.0–0.2)
Basos: 1 %
EOS (ABSOLUTE): 0.1 10*3/uL (ref 0.0–0.4)
Eos: 2 %
Hematocrit: 43.5 % (ref 37.5–51.0)
Hemoglobin: 14.7 g/dL (ref 13.0–17.7)
Immature Grans (Abs): 0 10*3/uL (ref 0.0–0.1)
Immature Granulocytes: 0 %
Lymphocytes Absolute: 1.9 10*3/uL (ref 0.7–3.1)
Lymphs: 30 %
MCH: 30.5 pg (ref 26.6–33.0)
MCHC: 33.8 g/dL (ref 31.5–35.7)
MCV: 90 fL (ref 79–97)
Monocytes Absolute: 0.8 10*3/uL (ref 0.1–0.9)
Monocytes: 13 %
Neutrophils Absolute: 3.5 10*3/uL (ref 1.4–7.0)
Neutrophils: 54 %
Platelets: 186 10*3/uL (ref 150–450)
RBC: 4.82 x10E6/uL (ref 4.14–5.80)
RDW: 13.3 % (ref 11.6–15.4)
WBC: 6.4 10*3/uL (ref 3.4–10.8)

## 2022-02-05 LAB — CMP14+EGFR
ALT: 11 IU/L (ref 0–44)
AST: 15 IU/L (ref 0–40)
Albumin/Globulin Ratio: 2 (ref 1.2–2.2)
Albumin: 4.2 g/dL (ref 3.7–4.7)
Alkaline Phosphatase: 50 IU/L (ref 44–121)
BUN/Creatinine Ratio: 12 (ref 10–24)
BUN: 25 mg/dL (ref 8–27)
Bilirubin Total: 0.3 mg/dL (ref 0.0–1.2)
CO2: 26 mmol/L (ref 20–29)
Calcium: 9.7 mg/dL (ref 8.6–10.2)
Chloride: 102 mmol/L (ref 96–106)
Creatinine, Ser: 2.01 mg/dL — ABNORMAL HIGH (ref 0.76–1.27)
Globulin, Total: 2.1 g/dL (ref 1.5–4.5)
Glucose: 161 mg/dL — ABNORMAL HIGH (ref 70–99)
Potassium: 5 mmol/L (ref 3.5–5.2)
Sodium: 139 mmol/L (ref 134–144)
Total Protein: 6.3 g/dL (ref 6.0–8.5)
eGFR: 34 mL/min/{1.73_m2} — ABNORMAL LOW (ref >59)

## 2022-02-10 DIAGNOSIS — E1140 Type 2 diabetes mellitus with diabetic neuropathy, unspecified: Secondary | ICD-10-CM

## 2022-02-10 DIAGNOSIS — Z794 Long term (current) use of insulin: Secondary | ICD-10-CM

## 2022-02-10 DIAGNOSIS — J441 Chronic obstructive pulmonary disease with (acute) exacerbation: Secondary | ICD-10-CM

## 2022-02-10 DIAGNOSIS — I480 Paroxysmal atrial fibrillation: Secondary | ICD-10-CM

## 2022-02-10 DIAGNOSIS — E118 Type 2 diabetes mellitus with unspecified complications: Secondary | ICD-10-CM

## 2022-02-10 DIAGNOSIS — I10 Essential (primary) hypertension: Secondary | ICD-10-CM

## 2022-02-16 ENCOUNTER — Telehealth: Payer: Self-pay | Admitting: Licensed Clinical Social Worker

## 2022-02-16 ENCOUNTER — Telehealth: Payer: Medicare Other

## 2022-02-16 NOTE — Telephone Encounter (Signed)
  Chronic Care Management     Clinical Social Work Note   02/16/22 Name: Todd Zhang  MRN: [id number]       DOB: [birth year]   Todd Zhang is a 76 y.o. year old male who is a primary care patient of [name], FNP. The CCM team was consulted to assist the patient with chronic disease management and/or care coordination needs related to: [name] .   Unsuccessful phone outreach by LCSW. LCSW called client phone numbers several times today but was unable to speak via phone with client today. LCSW did leave phone message for [name] asking him to please call LCSW at [phone]   Follow Up Plan:  LCSW to ask Care Guide scheduler to please re-schedule LCSW phone visit with client  Kelton Pillar.Tarrie Mcmichen MSW, LCSW Licensed Visual merchandiser [place] [facility] Care Management [phone]

## 2022-02-22 NOTE — Progress Notes (Deleted)
[facility] [place] HISTORY AND PHYSICAL   History of Present Illness:  Patient is a 77 y.o. year old male who presents for evaluation of PAD.  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 On his last visit he continues to have a palpable PT pulse on the left LE with biphasic wave form at 0.94 index and patent iliac stent.  He has been performing Dial soap soaks and Santyl dressing changes at home on his own.    He is medically managed on Lipitor, Plavix and Xarelto for Afib.          Past Medical History:  Diagnosis Date   Atrial fibrillation ([place])    Cervical spinal stenosis    DDD (degenerative disc disease), cervical    DDD (degenerative disc disease), lumbar    Diabetes mellitus without complication ([place])    Hyperlipidemia    Hypertension    Peripheral artery disease ([place])     Past Surgical History:  Procedure Laterality Date   ABDOMINAL AORTOGRAM W/LOWER EXTREMITY N/A 04/30/2021   Procedure: ABDOMINAL AORTOGRAM W/LOWER EXTREMITY;  Surgeon: Cherre Robins, MD;  Location: [facility];  Service: Cardiovascular;  Laterality: N/A;   AMPUTATION TOE     AMPUTATION TOE Left 05/04/2021   Procedure: LEFT GREAT TOE AMPUTATION;  Surgeon: Marty Heck, MD;  Location: [facility];  Service: Vascular;  Laterality: Left;   BYPASS GRAFT FEMORAL-PERONEAL Left 05/04/2021   Procedure: LEFT COMMON FEMORAL-TIBIOPERONEAL TRUNK BYPASS;  Surgeon: Marty Heck, MD;  Location: [facility];  Service: Vascular;  Laterality: Left;   ENDARTERECTOMY FEMORAL Left 05/04/2021   Procedure: LEFT COMMON FEMORAL ENDARTERECTOMY WITH BOVINE PATCH;  Surgeon: Marty Heck, MD;  Location: [facility];  Service: Vascular;  Laterality: Left;   ENDARTERECTOMY TIBIOPERONEAL Left 05/04/2021   Procedure:  LEFT BELOW KNEE POPITEAL WITH  TIBIOPERONEAL ENDARTERECTOMY;  Surgeon: Marty Heck, MD;  Location: [facility];  Service: Vascular;  Laterality: Left;   ENDOVEIN HARVEST OF GREATER SAPHENOUS VEIN  05/04/2021   Procedure: HARVEST OF LEFT GREATER SAPHENOUS VEIN;  Surgeon: Marty Heck, MD;  Location: [facility];  Service: Vascular;;   INSERTION OF ILIAC STENT Left 05/04/2021   Procedure: LEFT COMMON ILLIAC ATERY ANGIOPLASTY WITH STENT;  Surgeon: Marty Heck, MD;  Location: [facility];  Service: Vascular;  Laterality: Left;   INTRAOPERATIVE ARTERIOGRAM Left 05/04/2021   Procedure: LEFT ARTERY ARTERIOGRAM;  Surgeon: Marty Heck, MD;  Location: [facility];  Service: Vascular;  Laterality: Left;   IRRIGATION AND DEBRIDEMENT FOOT Left 05/04/2021   Procedure: IRRIGATION AND DEBRIDEMENT FOOT;  Surgeon: Marty Heck, MD;  Location: [facility];  Service: Vascular;  Laterality: Left;   KNEE SURGERY Left    NOSE SURGERY     TEE WITHOUT CARDIOVERSION N/A 05/01/2021   Procedure: TRANSESOPHAGEAL ECHOCARDIOGRAM (TEE);  Surgeon: Skeet Latch, MD;  Location: [facility];  Service: Cardiovascular;  Laterality: N/A;    ROS:   General:  No weight loss, Fever, chills  HEENT: No recent headaches, no nasal bleeding, no visual changes, no sore throat  Neurologic: No dizziness, blackouts, seizures. No recent symptoms of stroke or mini- stroke. No recent episodes of slurred speech, or temporary blindness.  Cardiac: No recent episodes of chest pain/pressure, no shortness of breath at rest.  No shortness of breath with exertion.  Denies history of atrial fibrillation or irregular heartbeat  Vascular: No history of rest pain in feet.  No history of claudication.  No history of non-healing ulcer, No history of DVT   Pulmonary: No home oxygen, no productive cough, no hemoptysis,  No asthma or wheezing  Musculoskeletal:  [ ]  Arthritis, [ ]  Low back pain,  [ ]  Joint pain  Hematologic:No history of  hypercoagulable state.  No history of easy bleeding.  No history of anemia  Gastrointestinal: No hematochezia or melena,  No gastroesophageal reflux, no trouble swallowing  Urinary: [ ]  chronic Kidney disease, [ ]  on HD - [ ]  MWF or [ ]  TTHS, [ ]  Burning with urination, [ ]  Frequent urination, [ ]  Difficulty urinating;   Skin: No rashes  Psychological: No history of anxiety,  No history of depression  Social History Social History   Tobacco Use   Smoking status: Former    Packs/day: 1.00    Types: Cigarettes    Start date: 06/19/1966    Quit date: 06/20/2011    Years since quitting: 10.6   Smokeless tobacco: Never  Vaping Use   Vaping Use: Never used  Substance Use Topics   Alcohol use: No    Comment: quit drinking in 1982   Drug use: Not Currently    Comment: marijuana and cocaine in the past, quit in 2012    Family History Family History  Problem Relation Age of Onset   Stroke Mother    Heart disease Mother        CABG   Cancer Father     Allergies  Allergies  Allergen Reactions   Asa [Aspirin] Hives   Penicillins Hives    Hives + vomiting as an adult  - tolerated Ceftriaxone 04/2021     Current Outpatient Medications  Medication Sig Dispense Refill   alendronate (FOSAMAX) 70 MG tablet TAKE 1 TABLET BY MOUTH ONCE A WEEK. TAKE WITH A FULL GLASS OF WATER ON AN EMPTY STOMACH 12 tablet 1   atorvastatin (LIPITOR) 40 MG tablet Take 1 tablet (40 mg total) by mouth daily. 90 tablet 1   benazepril (LOTENSIN) 5 MG tablet Take 1 tablet (5 mg total) by mouth daily. 90 tablet 1   cetirizine (ZYRTEC) 10 MG tablet Take 10 mg by mouth daily.     clopidogrel (PLAVIX) 75 MG tablet Take 1 tablet (75 mg total) by mouth daily. 90 tablet 1   collagenase (SANTYL) ointment Apply 1 application topically daily. 30 g 1   fenofibrate (TRICOR) 145 MG tablet Take 1 tablet (145 mg total) by mouth daily. 90 tablet 1   gabapentin (NEURONTIN) 300 MG capsule Take 1 capsule (300 mg total) by  mouth 4 (four) times daily. TAKE 1 CAPSULE BY MOUTH THREE TIMES DAILY FOR PAIN 120 capsule 5   glucose blood (ACCU-CHEK AVIVA PLUS) test strip CHECK BLOOD SUGAR FOUR TIMES DAILY Dx E11.40 400 strip 3   insulin regular (HUMULIN R) 100 units/mL injection INJECT 10 UNITS INTO THE SKIN THREE TIMES DAILY BEFORE MEALS 30 mL 1   Insulin Syringe-Needle U-100 (GLOBAL EASY GLIDE INSULIN SYR) 31G X 5/16" 0.3 ML MISC USE WITH INSULIN EVERY DAY Dx E11.40 100 each 3   metFORMIN (GLUCOPHAGE) 500 MG tablet Take 1 tablet (500 mg total) by mouth 2 (two) times daily. 180 tablet 1   metoprolol succinate (TOPROL-XL) 25 MG 24 hr tablet Take 1 tablet (25 mg total) by mouth daily. [place]  tablet 1   Multiple Vitamin (MULTIVITAMIN) tablet Take 1 tablet by mouth daily.     mupirocin ointment (BACTROBAN) 2 % Apply 1 application topically daily. Left Great Toe     pantoprazole (PROTONIX) 40 MG tablet Take 1 tablet (40 mg total) by mouth daily. 90 tablet 1   Rivaroxaban (XARELTO) 15 MG TABS tablet Take 1 tablet (15 mg total) by mouth daily with supper. 90 tablet 1   tamsulosin (FLOMAX) 0.4 MG CAPS capsule Take 1 capsule (0.4 mg total) by mouth daily. 90 capsule 1   triamcinolone cream (KENALOG) 0.5 % APPLY TO THE AFFECTED AREA(S) THREE TIMES DAILY     vitamin B-12 (CYANOCOBALAMIN) 500 MCG tablet Take 500 mcg by mouth daily.     No current facility-administered medications for this visit.    Physical Examination  There were no vitals filed for this visit.  There is no height or weight on file to calculate BMI.  General:  Alert and oriented, no acute distress HEENT: Normal Neck: No bruit or JVD Pulmonary: Clear to auscultation bilaterally Cardiac: Regular Rate and Rhythm without murmur Abdomen: Soft, non-tender, non-distended, no mass, no scars Skin: No rash Extremity Pulses:  2+ radial, brachial, femoral, dorsalis pedis, posterior tibial pulses bilaterally Musculoskeletal: No deformity or edema  Neurologic: Upper and  lower extremity motor 5/5 and symmetric  DATA: ***   ASSESSMENT: ***   PLAN: ***   Roxy Horseman PA-C [facility] [place] Office: [phone]  MD in clinic [place]

## 2022-02-23 ENCOUNTER — Encounter (HOSPITAL_COMMUNITY): Payer: Medicare Other

## 2022-02-23 ENCOUNTER — Ambulatory Visit: Payer: Medicare Other

## 2022-02-23 ENCOUNTER — Other Ambulatory Visit (HOSPITAL_COMMUNITY): Payer: Medicare Other

## 2022-03-02 ENCOUNTER — Ambulatory Visit (INDEPENDENT_AMBULATORY_CARE_PROVIDER_SITE_OTHER): Payer: Medicare Other | Admitting: Licensed Clinical Social Worker

## 2022-03-02 DIAGNOSIS — I4891 Unspecified atrial fibrillation: Secondary | ICD-10-CM

## 2022-03-02 DIAGNOSIS — E1140 Type 2 diabetes mellitus with diabetic neuropathy, unspecified: Secondary | ICD-10-CM

## 2022-03-02 DIAGNOSIS — I739 Peripheral vascular disease, unspecified: Secondary | ICD-10-CM

## 2022-03-02 DIAGNOSIS — J441 Chronic obstructive pulmonary disease with (acute) exacerbation: Secondary | ICD-10-CM

## 2022-03-02 DIAGNOSIS — F411 Generalized anxiety disorder: Secondary | ICD-10-CM

## 2022-03-02 DIAGNOSIS — I10 Essential (primary) hypertension: Secondary | ICD-10-CM

## 2022-03-02 NOTE — Chronic Care Management (AMB) (Signed)
Chronic Care Management    Clinical Social Work Note  03/02/2022 Name: Todd Zhang MRN: [id number] DOB: [birth year]  Todd Zhang is a 76 y.o. year old male who is a primary care patient of Todd Pierini, FNP. The CCM team was consulted to assist the patient with chronic disease management and/or care coordination needs related to: [name] .   Engaged with patient by telephone for follow up visit in response to provider referral for social work chronic care management and care coordination services.   Consent to Services:  The patient was given information about Chronic Care Management services, agreed to services, and gave verbal consent prior to initiation of services.  Please see initial visit note for detailed documentation.   Patient agreed to services and consent obtained.   Assessment: Review of patient past medical history, allergies, medications, and health status, including review of relevant consultants reports was performed today as part of a comprehensive evaluation and provision of chronic care management and care coordination services.     SDOH (Social Determinants of Health) assessments and interventions performed:  SDOH Interventions    Flowsheet Row Most Recent Value  SDOH Interventions   Physical Activity Interventions Other (Comments)  [walking challenges. Has difficulty in standing. Fatigues sometimes when walking]  Stress Interventions Provide Counseling  [client has stress related to managing medical needs]  Depression Interventions/Treatment  Counseling        Advanced Directives Status: See Vynca application for related entries.  CCM Care Plan  Allergies  Allergen Reactions   Asa [Aspirin] Hives   Penicillins Hives    Hives + vomiting as an adult  - tolerated Ceftriaxone 04/2021    Outpatient Encounter Medications as of 03/02/2022  Medication Sig   alendronate (FOSAMAX) 70 MG tablet TAKE 1 TABLET BY MOUTH ONCE A WEEK. TAKE WITH  A FULL GLASS OF WATER ON AN EMPTY STOMACH   atorvastatin (LIPITOR) 40 MG tablet Take 1 tablet (40 mg total) by mouth daily.   benazepril (LOTENSIN) 5 MG tablet Take 1 tablet (5 mg total) by mouth daily.   cetirizine (ZYRTEC) 10 MG tablet Take 10 mg by mouth daily.   clopidogrel (PLAVIX) 75 MG tablet Take 1 tablet (75 mg total) by mouth daily.   collagenase (SANTYL) ointment Apply 1 application topically daily.   fenofibrate (TRICOR) 145 MG tablet Take 1 tablet (145 mg total) by mouth daily.   gabapentin (NEURONTIN) 300 MG capsule Take 1 capsule (300 mg total) by mouth 4 (four) times daily. TAKE 1 CAPSULE BY MOUTH THREE TIMES DAILY FOR PAIN   glucose blood (ACCU-CHEK AVIVA PLUS) test strip CHECK BLOOD SUGAR FOUR TIMES DAILY Dx E11.40   insulin regular (HUMULIN R) 100 units/mL injection INJECT 10 UNITS INTO THE SKIN THREE TIMES DAILY BEFORE MEALS   Insulin Syringe-Needle U-100 (GLOBAL EASY GLIDE INSULIN SYR) 31G X 5/16" 0.3 ML MISC USE WITH INSULIN EVERY DAY Dx E11.40   metFORMIN (GLUCOPHAGE) 500 MG tablet Take 1 tablet (500 mg total) by mouth 2 (two) times daily.   metoprolol succinate (TOPROL-XL) 25 MG 24 hr tablet Take 1 tablet (25 mg total) by mouth daily.   Multiple Vitamin (MULTIVITAMIN) tablet Take 1 tablet by mouth daily.   mupirocin ointment (BACTROBAN) 2 % Apply 1 application topically daily. Left Great Toe   pantoprazole (PROTONIX) 40 MG tablet Take 1 tablet (40 mg total) by mouth daily.   Rivaroxaban (XARELTO) 15 MG TABS tablet Take 1 tablet (15 mg total) by mouth daily with  supper.   tamsulosin (FLOMAX) 0.4 MG CAPS capsule Take 1 capsule (0.4 mg total) by mouth daily.   triamcinolone cream (KENALOG) 0.5 % APPLY TO THE AFFECTED AREA(S) THREE TIMES DAILY   vitamin B-12 (CYANOCOBALAMIN) 500 MCG tablet Take 500 mcg by mouth daily.   No facility-administered encounter medications on file as of 03/02/2022.    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 09/13/2020   Pure hypercholesterolemia 07/16/2020   Peripheral artery disease (HCC)    COPD exacerbation (HCC) 05/14/2019   DDD (degenerative disc disease), cervical 11/06/2018   Generalized anxiety disorder 11/29/2017   Benign prostatic hyperplasia with incomplete bladder emptying 11/28/2017   Gastroesophageal reflux disease without esophagitis 09/07/2017   Essential hypertension 09/01/2017   Onychomycosis 09/01/2017   History of cardioversion 08/28/2015   Type 2 diabetes mellitus with diabetic neuropathy, with long-term current use of insulin (HCC) 06/19/2014    Conditions to be addressed/monitored: monitor in home care needs of client  Care Plan : LCSW Care plan  Updates made by Isaiah Blakes, LCSW since 03/02/2022 12:00 AM     Problem: Coping Skills (General Plan of Care)      Goal: Coping Skills Enhanced: manage housing needs of client; manage in home care needs of client   Start Date: 11/19/2021  Expected End Date: 06/04/2022  This Visit's Progress: On track  Recent Progress: On track  Priority: Medium  Note:   Current barriers:   Patient in need of assistance with connecting to community resources for possible help with in home care needs of client Patient is unable to independently navigate community resource options without care coordination support Mobility issues and pain issues Decreased energy  Clinical Goals:   LCSW to call client in next 30 days to discuss housing needs and housing situation of client Client to call RNCM or LCSW as needed in next 30 days for CCM program support LCSW to communicate with client in next 30 days to discuss in home care support for client Client  to call RNCM as needed in next 30 days for CCM nursing support  Clinical Interventions:  Collaboration with Todd Pierini, FNP regarding development and update of comprehensive plan of care as evidenced by provider attestation and co-signature Discussed with client his current needs, including sleeping issues of client, appetite of client, and pain issues of client.  Client said he has pain issues; he said sometimes these pain issues keep him from sleeping well at night Reviewed ambulation needs of client. Client said he is walking without a device at present. He previously talked about  Diabetic Shoes. Client had appointment on Feb 01, 2022  at 9:45 AM with Todd Pierini FNP at [facility]  He said she did not mention Diabetic Shoes or order for Diabetic Shoes. LCSW sent communication today to [facility] regarding Diabetic Shoes situation for client.  Reviewed Food Stamps needs of client. His Food Stamps benefit expired in January of 2023. Kyl said he called representative at DSS recently regarding Food Stamps benefit.  He said DSS representative contacted him and spoke with Renae Fickle about setting up Food Stamps benefit again for client. Client chose not to do this process  of re-applying for Food Stamps benefit Provided counseling support for client Encouraged [place] to call RNCM as needed for CCM nursing support Discussed medication procurement for client. Discussed skin care needs of client Discussed insurance of client. [place] has [employer] and also has Medicaid. He has Dual Complete coverage. He said he has [employer] U Card to use for food needs at present. LCSW informed him that he could also use U card to help with utility costs Discussed family support. He said he has support from his 3 sisters  Patient Coping Skills: Has support from his sister, Nonie Hoyer Has no transport needs Attends scheduled medical appointments  Patient deficits:   Mobility issues Pain issues Depression  issues  Patient Goals:   Patient will call RNCM or LCSW as needed in next 30 days for CCM support Patient will attend all scheduled client medical appointments in next 30 days Patient will communicate regularly with sister, Nonie Hoyer, in next 30 days to discuss ongoing needs of client -  Follow Up Plan: LCSW to call client on 04/12/22 at 11:00 AM      [name].Andreika Vandagriff MSW, [place] Holiday representative [facility] Care Management [phone]

## 2022-03-02 NOTE — Patient Instructions (Addendum)
Visit Information  Patient goals: Protect My Health (Patient).  Manage housing needs of client. Client to talk with [facility] nursing staff about breathing challenges of client. Manage mobility needs.  Timeframe:  Short-Term Goal Priority:  Medium Progress:  On Track Start Date:         11/19/21                 Expected End Date:       06/06/22                 Follow Up Date  04/12/22 at 11:00 AM   Protect My Health (Patient)  Manage housing needs of client ; client to talk with [facility] nursing staff about breathing challenges of client. Manage mobility needs   Why is this important?   Screening tests can find diseases early when they are easier to treat.  Your doctor or nurse will talk with you about which tests are important for you.  Getting shots for common diseases like the flu and shingles will help prevent them.     Patient Coping Skills: Attends scheduled medical appointments No transport needs Completes ADLs daily  Patient deficits:   Mobility issues Housing needs  Patient Goals:   Patient will call RNCM or LCSW as needed in next 30 days for CCM support Patient will attend all scheduled client medical appointments in next 30 days Patient will communicate regularly with his brother, Riley Lam, in next 30 days to discuss ongoing needs of client -  Follow Up Plan: LCSW to call client on 04/12/22 at 11:00 AM   Kelton Pillar.Shandon Matson MSW, LCSW Licensed Visual merchandiser [facility] Care Management [phone]

## 2022-03-12 DIAGNOSIS — E1140 Type 2 diabetes mellitus with diabetic neuropathy, unspecified: Secondary | ICD-10-CM

## 2022-03-12 DIAGNOSIS — I4891 Unspecified atrial fibrillation: Secondary | ICD-10-CM

## 2022-03-12 DIAGNOSIS — I10 Essential (primary) hypertension: Secondary | ICD-10-CM

## 2022-03-12 DIAGNOSIS — J441 Chronic obstructive pulmonary disease with (acute) exacerbation: Secondary | ICD-10-CM

## 2022-03-12 DIAGNOSIS — Z794 Long term (current) use of insulin: Secondary | ICD-10-CM

## 2022-03-22 ENCOUNTER — Ambulatory Visit: Payer: Self-pay | Admitting: *Deleted

## 2022-03-22 DIAGNOSIS — I10 Essential (primary) hypertension: Secondary | ICD-10-CM

## 2022-03-22 DIAGNOSIS — E1140 Type 2 diabetes mellitus with diabetic neuropathy, unspecified: Secondary | ICD-10-CM

## 2022-03-22 NOTE — Patient Instructions (Signed)
[name]  I have enjoyed working with you through the Chronic Care Management Program at [facility]. Due to program changes and no recent contact with the RN Care Manager, I am removing myself from your care team. If you are currently active with another CCM Team Member, you will remain active with them unless they reach out to you with additional information. If you feel that you need RN Care Management services in the future, please talk with your primary care provider to discuss re-engagement with the RN Care Manager.   Thank you for allowing me to participate in your your healthcare journey.  [name], BSN, RN-BC Embedded Chronic Care Manager [facility] Care Management Direct Dial: [phone]

## 2022-03-22 NOTE — Chronic Care Management (AMB) (Signed)
  Chronic Care Management   Note  03/22/2022 Name: Todd Zhang MRN: [id number] DOB: [birth year]   From a nursing perspective, patient was stable at last visit. Chart review does not indicate otherwise. Removing RN Care Manager from Care Team and closing RN Care Management Care Plans. If patient is currently engaged with another CCM team member I will forward this encounter to inform them of my case closure. Patient may be eligible for re-engagement with RN Care Manager in the future if necessary and can discuss this with their PCP.  [name], BSN, RN-BC Embedded Chronic Care Manager [facility] Care Management Direct Dial: [phone]

## 2022-03-25 ENCOUNTER — Other Ambulatory Visit: Payer: Self-pay | Admitting: Family Medicine

## 2022-04-12 ENCOUNTER — Ambulatory Visit (INDEPENDENT_AMBULATORY_CARE_PROVIDER_SITE_OTHER): Payer: Medicare Other | Admitting: Licensed Clinical Social Worker

## 2022-04-12 DIAGNOSIS — I10 Essential (primary) hypertension: Secondary | ICD-10-CM | POA: Diagnosis not present

## 2022-04-12 DIAGNOSIS — Z794 Long term (current) use of insulin: Secondary | ICD-10-CM

## 2022-04-12 DIAGNOSIS — E1140 Type 2 diabetes mellitus with diabetic neuropathy, unspecified: Secondary | ICD-10-CM

## 2022-04-12 DIAGNOSIS — F411 Generalized anxiety disorder: Secondary | ICD-10-CM

## 2022-04-12 DIAGNOSIS — J441 Chronic obstructive pulmonary disease with (acute) exacerbation: Secondary | ICD-10-CM | POA: Diagnosis not present

## 2022-04-12 DIAGNOSIS — I4891 Unspecified atrial fibrillation: Secondary | ICD-10-CM

## 2022-04-12 NOTE — Chronic Care Management (AMB) (Signed)
Chronic Care Management    Clinical Social Work Note  04/12/2022 Name: Todd Zhang MRN: [id number] DOB: [birth year]  Todd Zhang is a 76 y.o. year old male who is a primary care patient of Todd Pierini, FNP. The CCM team was consulted to assist the patient with chronic disease management and/or care coordination needs related to: [name] .   Engaged with patient by telephone for follow up visit in response to provider referral for social work chronic care management and care coordination services.   Consent to Services:  The patient was given information about Chronic Care Management services, agreed to services, and gave verbal consent prior to initiation of services.  Please see initial visit note for detailed documentation.   Patient agreed to services and consent obtained.   Assessment: Review of patient past medical history, allergies, medications, and health status, including review of relevant consultants reports was performed today as part of a comprehensive evaluation and provision of chronic care management and care coordination services.     SDOH (Social Determinants of Health) assessments and interventions performed:  SDOH Interventions    Flowsheet Row Most Recent Value  SDOH Interventions   Stress Interventions Provide Counseling  [client has stress related to finances. client has stress related to managing medical needs]  Depression Interventions/Treatment  Counseling        Advanced Directives Status: See Vynca application for related entries.  CCM Care Plan  Allergies  Allergen Reactions   Asa [Aspirin] Hives   Penicillins Hives    Hives + vomiting as an adult  - tolerated Ceftriaxone 04/2021    Outpatient Encounter Medications as of 04/12/2022  Medication Sig   alendronate (FOSAMAX) 70 MG tablet TAKE 1 TABLET BY MOUTH ONCE A WEEK. TAKE WITH A FULL GLASS OF WATER ON AN EMPTY STOMACH   atorvastatin (LIPITOR) 40 MG tablet Take 1 tablet  (40 mg total) by mouth daily.   benazepril (LOTENSIN) 5 MG tablet Take 1 tablet (5 mg total) by mouth daily.   cetirizine (ZYRTEC) 10 MG tablet Take 10 mg by mouth daily.   clopidogrel (PLAVIX) 75 MG tablet Take 1 tablet (75 mg total) by mouth daily.   collagenase (SANTYL) ointment Apply 1 application topically daily.   fenofibrate (TRICOR) 145 MG tablet Take 1 tablet (145 mg total) by mouth daily.   gabapentin (NEURONTIN) 300 MG capsule Take 1 capsule (300 mg total) by mouth 4 (four) times daily. TAKE 1 CAPSULE BY MOUTH THREE TIMES DAILY FOR PAIN   glucose blood (ACCU-CHEK AVIVA PLUS) test strip CHECK BLOOD SUGAR FOUR TIMES DAILY Dx E11.40   insulin regular (HUMULIN R) 100 units/mL injection INJECT 10 UNITS INTO THE SKIN THREE TIMES DAILY BEFORE MEALS   Insulin Syringe-Needle U-100 (GLOBAL INJECT EASE INSULIN SYR) 31G X 5/16" 0.3 ML MISC USE WITH INSULIN EVERY DAY Dx E11.40   metFORMIN (GLUCOPHAGE) 500 MG tablet Take 1 tablet (500 mg total) by mouth 2 (two) times daily.   metoprolol succinate (TOPROL-XL) 25 MG 24 hr tablet Take 1 tablet (25 mg total) by mouth daily.   Multiple Vitamin (MULTIVITAMIN) tablet Take 1 tablet by mouth daily.   mupirocin ointment (BACTROBAN) 2 % Apply 1 application topically daily. Left Great Toe   pantoprazole (PROTONIX) 40 MG tablet Take 1 tablet (40 mg total) by mouth daily.   Rivaroxaban (XARELTO) 15 MG TABS tablet Take 1 tablet (15 mg total) by mouth daily with supper.   tamsulosin (FLOMAX) 0.4 MG CAPS capsule Take 1  capsule (0.4 mg total) by mouth daily.   triamcinolone cream (KENALOG) 0.5 % APPLY TO THE AFFECTED AREA(S) THREE TIMES DAILY   vitamin B-12 (CYANOCOBALAMIN) 500 MCG tablet Take 500 mcg by mouth daily.   No facility-administered encounter medications on file as of 04/12/2022.    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 09/13/2020   Pure hypercholesterolemia 07/16/2020   Peripheral artery disease ([place])    COPD exacerbation ([place]) 05/14/2019   DDD (degenerative disc disease), cervical 11/06/2018   Generalized anxiety disorder 11/29/2017   Benign prostatic hyperplasia with incomplete bladder emptying 11/28/2017   Gastroesophageal reflux disease without esophagitis 09/07/2017   Essential hypertension 09/01/2017   Onychomycosis 09/01/2017   History of cardioversion 08/28/2015   Type 2 diabetes mellitus with diabetic neuropathy, with long-term current use of insulin ([place]) 06/19/2014    Conditions to be addressed/monitored: monitor client management of in home care needs  Care Plan : LCSW Care plan  Updates made by Katha Cabal, LCSW since 04/12/2022 12:00 AM     Problem: Coping Skills (General Plan of Care)      Goal: Coping Skills Enhanced: manage housing needs of client; manage in home care needs of client   Start Date: 11/19/2021  Expected End Date: 06/09/2022  This Visit's Progress: On track  Recent Progress: On track  Priority: Medium  Note:   Current barriers:   Patient in need of assistance with connecting to community resources for possible help with in home care needs of client Patient is unable to independently navigate community resource options without care coordination support Mobility issues and pain issues Decreased energy  Clinical Goals:   LCSW to call client in next 30 days to discuss housing needs and housing situation of client Client to call LCSW as needed in next 30 days for CCM program support LCSW to communicate with client in next 30 days to discuss in home care support for client Client to attend scheduled medical appointments in next 30 days   Clinical Interventions:   Collaboration with [name], [place] regarding development and update of comprehensive plan of care as evidenced by provider attestation and co-signature Discussed with client his current needs. Reviewed ambulation needs of client. Client said he is walking without a device at present. He previously talked about  Diabetic Shoes. Client  said he still has no diabetic shoes to wear.  LCSW contacted [facility] today and spoke with receptionist at [facility] and shared information. Receptionist at [facility] was going to give this information to Jamelle Haring, LPN to look into situation for client related to Diabetic shoes procurement Reviewed family support. Client has support from his 3 sisters Reviewed transport needs. He drives his truck as needed to appointments and to complete errands Client stated that he has appointment on April 20, 2022 in [place], [place] for medical tests. Reviewed medication procurement for client Reviewed client use of [employer]  . Client said he uses [employer] regularly and it is very helpful in obtaining needed food items for client. Provided counseling support for client  Patient Coping Skills: Has support from his sister, Nonie Hoyer Has no transport needs Attends scheduled medical appointments  Patient deficits:   Mobility issues Pain issues Depression issues  Patient Goals:   Patient will call LCSW as needed in next 30 days for CCM support Patient will attend all scheduled client medical appointments in next 30 days Patient will communicate regularly with sister, [name], in next 30 days to discuss ongoing needs of client -  Follow Up Plan: LCSW to call client on 05/05/22 at 10:00 AM      Kel[place] Pillar.Siniya Lichty MSW, LCSW Licensed Visual merchandiser [facility] Care Management [phone]

## 2022-04-12 NOTE — Patient Instructions (Addendum)
Visit Information  Patient goals:  Protect My Health (Patient).  Manage housing needs. Talk with [facility] nursing staff, as needed, about breathing challenges of client. Manage mobility needs  Timeframe:  Short-Term Goal Priority:  Medium Progress:  On Track Start Date:         11/19/21                 Expected End Date:       06/06/22                 Follow Up Date  05/05/22 at 10:00 AM   Protect My Health (Patient)  Manage housing needs of client ; client to talk with [facility] nursing staff about breathing challenges of client. Manage mobility needs   Why is this important?   Screening tests can find diseases early when they are easier to treat.  Your doctor or nurse will talk with you about which tests are important for you.  Getting shots for common diseases like the flu and shingles will help prevent them.     Patient Coping Skills: Attends scheduled medical appointments No transport needs Completes ADLs daily  Patient deficits:   Mobility issues Housing needs  Patient Goals:   Patient will call LCSW as needed in next 30 days for CCM support Patient will attend all scheduled client medical appointments in next 30 days Patient will communicate regularly with his brother, Riley Lam, in next 30 days to discuss ongoing needs of client -  Follow Up Plan: LCSW to call client on 05/05/22 at 10:00 AM    [place] Pillar.Jaceon Heiberger MSW, LCSW Licensed Visual merchandiser [facility] Care Management [phone]

## 2022-04-18 ENCOUNTER — Other Ambulatory Visit: Payer: Self-pay | Admitting: Nurse Practitioner

## 2022-04-20 ENCOUNTER — Other Ambulatory Visit (HOSPITAL_COMMUNITY): Payer: Medicare Other

## 2022-04-20 ENCOUNTER — Ambulatory Visit: Payer: Medicare Other

## 2022-04-20 ENCOUNTER — Encounter (HOSPITAL_COMMUNITY): Payer: Medicare Other

## 2022-05-05 ENCOUNTER — Telehealth: Payer: Medicare Other

## 2022-05-07 ENCOUNTER — Ambulatory Visit (INDEPENDENT_AMBULATORY_CARE_PROVIDER_SITE_OTHER): Payer: Medicare Other | Admitting: Nurse Practitioner

## 2022-05-07 ENCOUNTER — Encounter: Payer: Self-pay | Admitting: Nurse Practitioner

## 2022-05-07 VITALS — BP 109/59 | HR 84 | Temp 98.0°F | Resp 20 | Ht 72.0 in | Wt 217.0 lb

## 2022-05-07 DIAGNOSIS — N1832 Chronic kidney disease, stage 3b: Secondary | ICD-10-CM

## 2022-05-07 DIAGNOSIS — K219 Gastro-esophageal reflux disease without esophagitis: Secondary | ICD-10-CM

## 2022-05-07 DIAGNOSIS — J441 Chronic obstructive pulmonary disease with (acute) exacerbation: Secondary | ICD-10-CM | POA: Diagnosis not present

## 2022-05-07 DIAGNOSIS — I10 Essential (primary) hypertension: Secondary | ICD-10-CM | POA: Diagnosis not present

## 2022-05-07 DIAGNOSIS — E1140 Type 2 diabetes mellitus with diabetic neuropathy, unspecified: Secondary | ICD-10-CM

## 2022-05-07 DIAGNOSIS — R609 Edema, unspecified: Secondary | ICD-10-CM

## 2022-05-07 DIAGNOSIS — Z794 Long term (current) use of insulin: Secondary | ICD-10-CM

## 2022-05-07 DIAGNOSIS — I739 Peripheral vascular disease, unspecified: Secondary | ICD-10-CM

## 2022-05-07 DIAGNOSIS — R3914 Feeling of incomplete bladder emptying: Secondary | ICD-10-CM

## 2022-05-07 DIAGNOSIS — I4891 Unspecified atrial fibrillation: Secondary | ICD-10-CM | POA: Diagnosis not present

## 2022-05-07 DIAGNOSIS — E7800 Pure hypercholesterolemia, unspecified: Secondary | ICD-10-CM

## 2022-05-07 DIAGNOSIS — N401 Enlarged prostate with lower urinary tract symptoms: Secondary | ICD-10-CM

## 2022-05-07 DIAGNOSIS — F411 Generalized anxiety disorder: Secondary | ICD-10-CM

## 2022-05-07 LAB — CBC WITH DIFFERENTIAL/PLATELET
Basophils Absolute: 0 10*3/uL (ref 0.0–0.2)
Basos: 1 %
EOS (ABSOLUTE): 0.1 10*3/uL (ref 0.0–0.4)
Eos: 1 %
Hematocrit: 38.9 % (ref 37.5–51.0)
Hemoglobin: 13.6 g/dL (ref 13.0–17.7)
Immature Grans (Abs): 0 10*3/uL (ref 0.0–0.1)
Immature Granulocytes: 0 %
Lymphocytes Absolute: 2.1 10*3/uL (ref 0.7–3.1)
Lymphs: 32 %
MCH: 30.8 pg (ref 26.6–33.0)
MCHC: 35 g/dL (ref 31.5–35.7)
MCV: 88 fL (ref 79–97)
Monocytes Absolute: 0.4 10*3/uL (ref 0.1–0.9)
Monocytes: 7 %
Neutrophils Absolute: 3.8 10*3/uL (ref 1.4–7.0)
Neutrophils: 59 %
Platelets: 192 10*3/uL (ref 150–450)
RBC: 4.41 x10E6/uL (ref 4.14–5.80)
RDW: 12.8 % (ref 11.6–15.4)
WBC: 6.4 10*3/uL (ref 3.4–10.8)

## 2022-05-07 LAB — LIPID PANEL
Chol/HDL Ratio: 3.8 ratio (ref 0.0–5.0)
Cholesterol, Total: 186 mg/dL (ref 100–199)
HDL: 49 mg/dL (ref >39)
LDL Chol Calc (NIH): 109 mg/dL — ABNORMAL HIGH (ref 0–99)
Triglycerides: 160 mg/dL — ABNORMAL HIGH (ref 0–149)
VLDL Cholesterol Cal: 28 mg/dL (ref 5–40)

## 2022-05-07 LAB — CMP14+EGFR
ALT: 14 IU/L (ref 0–44)
AST: 20 IU/L (ref 0–40)
Albumin/Globulin Ratio: 1.9 (ref 1.2–2.2)
Albumin: 4.1 g/dL (ref 3.8–4.8)
Alkaline Phosphatase: 47 IU/L (ref 44–121)
BUN/Creatinine Ratio: 18 (ref 10–24)
BUN: 33 mg/dL — ABNORMAL HIGH (ref 8–27)
Bilirubin Total: 0.3 mg/dL (ref 0.0–1.2)
CO2: 23 mmol/L (ref 20–29)
Calcium: 9.8 mg/dL (ref 8.6–10.2)
Chloride: 103 mmol/L (ref 96–106)
Creatinine, Ser: 1.83 mg/dL — ABNORMAL HIGH (ref 0.76–1.27)
Globulin, Total: 2.2 g/dL (ref 1.5–4.5)
Glucose: 161 mg/dL — ABNORMAL HIGH (ref 70–99)
Potassium: 5.4 mmol/L — ABNORMAL HIGH (ref 3.5–5.2)
Sodium: 138 mmol/L (ref 134–144)
Total Protein: 6.3 g/dL (ref 6.0–8.5)
eGFR: 38 mL/min/{1.73_m2} — ABNORMAL LOW (ref >59)

## 2022-05-07 LAB — BAYER DCA HB A1C WAIVED: HB A1C (BAYER DCA - WAIVED): 7.6 % — ABNORMAL HIGH (ref 4.8–5.6)

## 2022-05-07 MED ORDER — FUROSEMIDE 20 MG PO TABS: 20.0000 mg | ORAL_TABLET | Freq: Every day | ORAL | 1 refills | Status: DC

## 2022-05-07 NOTE — Progress Notes (Signed)
Subjective:    Patient ID: Todd Zhang, male    DOB: 06-Aug-1946, 77 y.o.   MRN: [id number]   Chief Complaint: medical management of chronic issues     HPI:  Todd Zhang is a 76 y.o. who identifies as a male who was assigned male at birth.   Social history: Lives with: by himself Work history: retired   Scientist, forensic in today for follow up of the following chronic medical issues:  1. Essential hypertension No c/o chest pain, sob or headache. Does not check blood pressure at home. BP Readings from Last 3 Encounters:  02/04/22 109/63  10/30/21 120/69  08/11/21 (!) 147/82     2. Atrial fibrillation with RVR (HCC) Denies palpitations and or heart racing. Is on xarelto with no bleeding issues. Happens when he gets hot. Episodes can last for several minutes.  3. Peripheral artery disease ([place]) Has very poor lower ext circulation. Has had a toe amputated due  to sore that would not heal. He denies any lesions or sore on bil lower ext. Takes plavix daily.  4. COPD exacerbation ([place]) Is currently on no inhalers. Denis cough or SOB.  5. Gastroesophageal reflux disease without esophagitis Is on protonix daly and is ding well.  6. Type 2 diabetes mellitus with diabetic neuropathy, with long-term current use of insulin (HCC) Doe snot check blood pressure at  home. Lab Results  Component Value Date   HGBA1C 7.1 (H) 02/04/2022     7. Stage 3b chronic kidney disease (HCC) No voiding issues Lab Results  Component Value Date   CREATININE 2.01 (H) 02/04/2022     8. Benign prostatic hyperplasia with incomplete bladder emptying No voiding issues Lab Results  Component Value Date   PSA1 0.7 10/30/2021   PSA1 0.5 08/24/2019   PSA1 0.6 11/28/2017      9. Pure hypercholesterolemia Does not watch diet and does no dedicated exercise   Lab Results  Component Value Date   CHOL 200 (H) 02/04/2022   HDL 39 (L) 02/04/2022   LDLCALC 127 (H) 02/04/2022   TRIG 190 (H) 02/04/2022    CHOLHDL 5.1 (H) 02/04/2022  ]   10. Generalized anxiety disorder Stays stressed    05/07/2022   11:33 AM 02/04/2022   11:33 AM 10/30/2021    8:48 AM 06/19/2021    3:02 PM  GAD 7 : Generalized Anxiety Score  Nervous, Anxious, on Edge 1 0 0 0  Control/stop worrying 1 0 0 0  Worry too much - different things 1 0 0 0  Trouble relaxing 1 0 0 0  Restless 1 0 0 0  Easily annoyed or irritable 1 0 0 0  Afraid - awful might happen 1 0 0 0  Total GAD 7 Score 7 0 0 0  Anxiety Difficulty Not difficult at all Not difficult at all  Not difficult at all    11. BMI 29.0-29.9 No recent weight changes Wt Readings from Last 3 Encounters:  05/07/22 217 lb (98.4 kg)  02/04/22 212 lb (96.2 kg)  11/30/21 208 lb (94.3 kg)   BMI Readings from Last 3 Encounters:  05/07/22 29.43 kg/m  02/04/22 28.75 kg/m  11/30/21 28.21 kg/m     New complaints: Bil lower ext edema daily.  Allergies  Allergen Reactions   Asa [Aspirin] Hives   Penicillins Hives    Hives + vomiting as an adult  - tolerated Ceftriaxone 04/2021   Outpatient Encounter Medications as of 05/07/2022  Medication Sig  alendronate (FOSAMAX) 70 MG tablet TAKE 1 TABLET BY MOUTH ONCE WEEKLY ON SUNDAY ON AN EMPTY STOMACH WITH a full GLASS of water   atorvastatin (LIPITOR) 40 MG tablet Take 1 tablet (40 mg total) by mouth daily.   benazepril (LOTENSIN) 5 MG tablet Take 1 tablet (5 mg total) by mouth daily.   cetirizine (ZYRTEC) 10 MG tablet Take 10 mg by mouth daily.   clopidogrel (PLAVIX) 75 MG tablet Take 1 tablet (75 mg total) by mouth daily.   collagenase (SANTYL) ointment Apply 1 application topically daily.   fenofibrate (TRICOR) 145 MG tablet Take 1 tablet (145 mg total) by mouth daily.   gabapentin (NEURONTIN) 300 MG capsule Take 1 capsule (300 mg total) by mouth 4 (four) times daily. TAKE 1 CAPSULE BY MOUTH THREE TIMES DAILY FOR PAIN   glucose blood (ACCU-CHEK AVIVA PLUS) test strip CHECK BLOOD SUGAR FOUR TIMES DAILY Dx E11.40    insulin regular (HUMULIN R) 100 units/mL injection INJECT 10 UNITS INTO THE SKIN THREE TIMES DAILY BEFORE MEALS   Insulin Syringe-Needle U-100 (GLOBAL INJECT EASE INSULIN SYR) 31G X 5/16" 0.3 ML MISC USE WITH INSULIN EVERY DAY Dx E11.40   metFORMIN (GLUCOPHAGE) 500 MG tablet Take 1 tablet (500 mg total) by mouth 2 (two) times daily.   metoprolol succinate (TOPROL-XL) 25 MG 24 hr tablet Take 1 tablet (25 mg total) by mouth daily.   Multiple Vitamin (MULTIVITAMIN) tablet Take 1 tablet by mouth daily.   mupirocin ointment (BACTROBAN) 2 % Apply 1 application topically daily. Left Great Toe   pantoprazole (PROTONIX) 40 MG tablet Take 1 tablet (40 mg total) by mouth daily.   Rivaroxaban (XARELTO) 15 MG TABS tablet Take 1 tablet (15 mg total) by mouth daily with supper.   tamsulosin (FLOMAX) 0.4 MG CAPS capsule Take 1 capsule (0.4 mg total) by mouth daily.   triamcinolone cream (KENALOG) 0.5 % APPLY TO THE AFFECTED AREA(S) THREE TIMES DAILY   vitamin B-12 (CYANOCOBALAMIN) 500 MCG tablet Take 500 mcg by mouth daily.   No facility-administered encounter medications on file as of 05/07/2022.    Past Surgical History:  Procedure Laterality Date   ABDOMINAL AORTOGRAM W/LOWER EXTREMITY N/A 04/30/2021   Procedure: ABDOMINAL AORTOGRAM W/LOWER EXTREMITY;  Surgeon: Cherre Robins, MD;  Location: [facility];  Service: Cardiovascular;  Laterality: N/A;   AMPUTATION TOE     AMPUTATION TOE Left 05/04/2021   Procedure: LEFT GREAT TOE AMPUTATION;  Surgeon: Marty Heck, MD;  Location: [place];  Service: Vascular;  Laterality: Left;   BYPASS GRAFT FEMORAL-PERONEAL Left 05/04/2021   Procedure: LEFT COMMON FEMORAL-TIBIOPERONEAL TRUNK BYPASS;  Surgeon: Marty Heck, MD;  Location: [facility];  Service: Vascular;  Laterality: Left;   ENDARTERECTOMY FEMORAL Left 05/04/2021   Procedure: LEFT COMMON FEMORAL ENDARTERECTOMY WITH BOVINE PATCH;  Surgeon: Marty Heck, MD;  Location: [facility] [place];  Service:  Vascular;  Laterality: Left;   ENDARTERECTOMY TIBIOPERONEAL Left 05/04/2021   Procedure: LEFT BELOW KNEE [place] WITH  TIBIOPERONEAL ENDARTERECTOMY;  Surgeon: Marty Heck, MD;  Location: [place];  Service: Vascular;  Laterality: Left;   ENDOVEIN HARVEST OF GREATER SAPHENOUS VEIN  05/04/2021   Procedure: HARVEST OF LEFT GREATER SAPHENOUS VEIN;  Surgeon: Marty Heck, MD;  Location: [place];  Service: Vascular;;   INSERTION OF ILIAC STENT Left 05/04/2021   Procedure: LEFT COMMON ILLIAC ATERY ANGIOPLASTY WITH STENT;  Surgeon: Marty Heck, MD;  Location: [place];  Service: Vascular;  Laterality: Left;  INTRAOPERATIVE ARTERIOGRAM Left 05/04/2021   Procedure: LEFT ARTERY ARTERIOGRAM;  Surgeon: Marty Heck, MD;  Location: [place];  Service: Vascular;  Laterality: Left;   IRRIGATION AND DEBRIDEMENT FOOT Left 05/04/2021   Procedure: IRRIGATION AND DEBRIDEMENT FOOT;  Surgeon: Marty Heck, MD;  Location: [place];  Service: Vascular;  Laterality: Left;   KNEE SURGERY Left    NOSE SURGERY     TEE WITHOUT CARDIOVERSION N/A 05/01/2021   Procedure: TRANSESOPHAGEAL ECHOCARDIOGRAM (TEE);  Surgeon: Skeet Latch, MD;  Location[facility];  Service: Cardiovascular;  Laterality: N/A;    Family History  Problem Relation Age of Onset   Stroke Mother    Heart disease Mother        CABG   Cancer Father       Controlled substance contract: n/a     Review of Systems  Constitutional:  Negative for diaphoresis.  Eyes:  Negative for pain.  Respiratory:  Negative for shortness of breath.   Cardiovascular:  Positive for palpitations and leg swelling. Negative for chest pain.  Gastrointestinal:  Negative for abdominal pain.  Endocrine: Negative for polydipsia.  Skin:  Negative for rash.  Neurological:  Negative for dizziness, weakness and headaches.  Hematological:  Does not bruise/bleed easily.  All other systems reviewed and are negative.      Objective:    Physical Exam Vitals and nursing note reviewed.  Constitutional:      Appearance: Normal appearance. He is well-developed.  HENT:     Head: Normocephalic.     Nose: Nose normal.     Mouth/Throat:     Mouth: Mucous membranes are moist.     Pharynx: Oropharynx is clear.  Eyes:     Pupils: Pupils are equal, round, and reactive to light.  Neck:     Thyroid: No thyroid mass or thyromegaly.     Vascular: No carotid bruit or JVD.     Trachea: Phonation normal.  Cardiovascular:     Rate and Rhythm: Normal rate and regular rhythm.  Pulmonary:     Effort: Pulmonary effort is normal. No respiratory distress.     Breath sounds: Normal breath sounds.  Abdominal:     General: Bowel sounds are normal.     Palpations: Abdomen is soft.     Tenderness: There is no abdominal tenderness.  Musculoskeletal:        General: Normal range of motion.     Cervical back: Normal range of motion and neck supple.     Right lower leg: Edema (1+) present.     Left lower leg: Edema (1+) present.  Lymphadenopathy:     Cervical: No cervical adenopathy.  Skin:    General: Skin is warm and dry.  Neurological:     Mental Status: He is alert and oriented to person, place, and time.  Psychiatric:        Behavior: Behavior normal.        Thought Content: Thought content normal.        Judgment: Judgment normal.    HGBA1c 7.6%   BP (!) 109/59   Pulse 84   Temp 98 F (36.7 C) (Temporal)   Resp 20   Ht 6' (1.829 m)   Wt 217 lb (98.4 kg)   SpO2 96%   BMI 29.43 kg/m      Assessment & Plan:  Todd Zhang comes in today with chief complaint of Medical Management of Chronic Issues   Diagnosis and orders addressed:  1. Essential  hypertension Low sodium diet - CBC with Differential/Platelet - CMP14+EGFR  2. Atrial fibrillation with RVR [facility]) Referral to cardiology Avoid caffeine - Ambulatory referral to Cardiology  3. Peripheral artery disease ([place]) Needs to follow up with vascular  surgeon  4. COPD exacerbation ([place])  5. Gastroesophageal reflux disease without esophagitis Avoid spicy foods Do not eat 2 hours prior to bedtime  6. Type 2 diabetes mellitus with diabetic neuropathy, with long-term current use of insulin (HCC) Stricter crb coutning - Bayer DCA Hb A1c Waived - PR DIABETIC DELUXE SHOE  7. Stage 3b chronic kidney disease ([place]) Labs pending  8. Benign prostatic hyperplasia with incomplete bladder emptying Report any voiding issues  9. Pure hypercholesterolemia Low fat dit - Lipid panel  10. Generalized anxiety disorder Stress management  11. Peripheral edema Elevate legs when sitting - furosemide (LASIX) 20 MG tablet; Take 1 tablet (20 mg total) by mouth daily.  Dispense: 90 tablet; Refill: 1  12. Bmi 29.0-29.1 Discussed diet and exercise for person with BMI >25 Will recheck weight in 3-6 months  Labs pending Health Maintenance reviewed Diet and exercise encouraged  Follow up plan: 3 months   [name] [name], FNP

## 2022-05-07 NOTE — Patient Instructions (Signed)
Peripheral Edema  Peripheral edema is swelling that is caused by a buildup of fluid. Peripheral edema most often affects the lower legs, ankles, and feet. It can also develop in the arms, hands, and face. The area of the body that has peripheral edema will look swollen. It may also feel heavy or warm. Your clothes may start to feel tight. Pressing on the area may make a temporary dent in your skin (pitting edema). You may not be able to move your swollen arm or leg as much as usual. There are many causes of peripheral edema. It can happen because of a complication of other conditions such as heart failure, kidney disease, or a problem with your circulation. It also can be a side effect of certain medicines or happen because of an infection. It often happens to women during pregnancy. Sometimes, the cause is not known. Follow these instructions at home: Managing pain, stiffness, and swelling  Raise (elevate) your legs while you are sitting or lying down. Move around often to prevent stiffness and to reduce swelling. Do not sit or stand for long periods of time. Do not wear tight clothing. Do not wear garters on your upper legs. Exercise your legs to get your circulation going. This helps to move the fluid back into your blood vessels, and it may help the swelling go down. Wear compression stockings as told by your health care provider. These stockings help to prevent blood clots and reduce swelling in your legs. It is important that these are the correct size. These stockings should be prescribed by your doctor to prevent possible injuries. If elastic bandages or wraps are recommended, use them as told by your health care provider. Medicines Take over-the-counter and prescription medicines only as told by your health care provider. Your health care provider may prescribe medicine to help your body get rid of excess water (diuretic). Take this medicine if you are told to take it. General  instructions Eat a low-salt (low-sodium) diet as told by your health care provider. Sometimes, eating less salt may reduce swelling. Pay attention to any changes in your symptoms. Moisturize your skin daily to help prevent skin from cracking and draining. Keep all follow-up visits. This is important. Contact a health care provider if: You have a fever. You have swelling in only one leg. You have increased swelling, redness, or pain in one or both of your legs. You have drainage or sores at the area where you have edema. Get help right away if: You have edema that starts suddenly or is getting worse, especially if you are pregnant or have a medical condition. You develop shortness of breath, especially when you are lying down. You have pain in your chest or abdomen. You feel weak. You feel like you will faint. These symptoms may be an emergency. Get help right away. Call 911. Do not wait to see if the symptoms will go away. Do not drive yourself to the hospital. Summary Peripheral edema is swelling that is caused by a buildup of fluid. Peripheral edema most often affects the lower legs, ankles, and feet. Move around often to prevent stiffness and to reduce swelling. Do not sit or stand for long periods of time. Pay attention to any changes in your symptoms. Contact a health care provider if you have edema that starts suddenly or is getting worse, especially if you are pregnant or have a medical condition. Get help right away if you develop shortness of breath, especially when lying down.   This information is not intended to replace advice given to you by your health care provider. Make sure you discuss any questions you have with your health care provider. Document Revised: 05/04/2021 Document Reviewed: 05/04/2021 Elsevier Patient Education  2023 [employer]  

## 2022-05-12 ENCOUNTER — Other Ambulatory Visit: Payer: Self-pay | Admitting: Nurse Practitioner

## 2022-06-29 ENCOUNTER — Other Ambulatory Visit: Payer: Self-pay | Admitting: Nurse Practitioner

## 2022-07-08 ENCOUNTER — Encounter: Payer: Self-pay | Admitting: Internal Medicine

## 2022-07-09 ENCOUNTER — Ambulatory Visit: Payer: Medicare Other | Admitting: Cardiology

## 2022-07-19 ENCOUNTER — Other Ambulatory Visit: Payer: Self-pay | Admitting: Nurse Practitioner

## 2022-07-19 DIAGNOSIS — E1140 Type 2 diabetes mellitus with diabetic neuropathy, unspecified: Secondary | ICD-10-CM

## 2022-07-22 ENCOUNTER — Encounter: Payer: Self-pay | Admitting: *Deleted

## 2022-07-22 ENCOUNTER — Telehealth: Payer: Self-pay

## 2022-07-22 NOTE — Telephone Encounter (Signed)
Transition Care Management Follow-up Telephone Call Date of discharge and from where: 07/21/22 [facility]  How have you been since you were released from the hospital? Patient is still having a great deal of pain  Any questions or concerns? No  Items Reviewed: Did the pt receive and understand the discharge instructions provided? Yes  Medications obtained and verified? Yes  Other? Yes  Any new allergies since your discharge? No  Dietary orders reviewed? Yes Do you have support at home? Yes   Home Care and Equipment/Supplies: Were home health services ordered? not applicable If so, what is the name of the agency? na  Has the agency set up a time to come to the patient's home? not applicable Were any new equipment or medical supplies ordered?  No What is the name of the medical supply agency? na Were you able to get the supplies/equipment? not applicable Do you have any questions related to the use of the equipment or supplies? No  Functional Questionnaire: (I = Independent and D = Dependent) ADLs: I  Bathing/Dressing- I  Meal Prep- I  Eating- I  Maintaining continence- I  Transferring/Ambulation- I  Managing Meds- I  Follow up appointments reviewed:  PCP Hospital f/u appt confirmed? Yes  Scheduled to see DOD Lendon Colonel) on 07/23/2022 @ 220pm. Specialist [facility] f/u appt confirmed? Yes  Scheduled to see Ortho Surg Ricke Hey  on 07/26/2022  Are transportation arrangements needed? No  If their condition worsens, is the pt aware to call PCP or go to the Emergency Dept.? Yes Was the patient provided with contact information for the PCP's office or ED? Yes Was to pt encouraged to call back with questions or concerns? Yes

## 2022-07-23 ENCOUNTER — Ambulatory Visit (INDEPENDENT_AMBULATORY_CARE_PROVIDER_SITE_OTHER): Payer: Medicare Other | Admitting: Family

## 2022-07-23 ENCOUNTER — Encounter: Payer: Self-pay | Admitting: Family

## 2022-07-23 VITALS — BP 97/59 | HR 85 | Temp 97.6°F | Ht 72.0 in | Wt 226.0 lb

## 2022-07-23 DIAGNOSIS — S82142D Displaced bicondylar fracture of left tibia, subsequent encounter for closed fracture with routine healing: Secondary | ICD-10-CM

## 2022-07-23 DIAGNOSIS — S2232XD Fracture of one rib, left side, subsequent encounter for fracture with routine healing: Secondary | ICD-10-CM | POA: Diagnosis not present

## 2022-07-23 DIAGNOSIS — W19XXXD Unspecified fall, subsequent encounter: Secondary | ICD-10-CM | POA: Diagnosis not present

## 2022-07-23 DIAGNOSIS — N1832 Chronic kidney disease, stage 3b: Secondary | ICD-10-CM | POA: Diagnosis not present

## 2022-07-23 DIAGNOSIS — Y92009 Unspecified place in unspecified non-institutional (private) residence as the place of occurrence of the external cause: Secondary | ICD-10-CM

## 2022-07-23 NOTE — Patient Instructions (Signed)
Fall Prevention in the Home, Adult Falls can cause injuries and affect people of all ages. There are many simple things that you can do to make your home safe and to help prevent falls. Ask for help when making these changes, if needed. What actions can I take to prevent falls? General instructions Use good lighting in all rooms. Replace any light bulbs that burn out, turn on lights if it is dark, and use night-lights. Place frequently used items in easy-to-reach places. Lower the shelves around your home if necessary. Set up furniture so that there are clear paths around it. Avoid moving your furniture around. Remove throw rugs and other tripping hazards from the floor. Avoid walking on wet floors. Fix any uneven floor surfaces. Add color or contrast paint or tape to grab bars and handrails in your home. Place contrasting color strips on the first and last steps of staircases. When you use a stepladder, make sure that it is completely opened and that the sides and supports are firmly locked. Have someone hold the ladder while you are using it. Do not climb a closed stepladder. Know where your pets are when moving through your home. What can I do in the bathroom?     Keep the floor dry. Immediately clean up any water that is on the floor. Remove soap buildup in the tub or shower regularly. Use nonskid mats or decals on the floor of the tub or shower. Attach bath mats securely with double-sided, nonslip rug tape. If you need to sit down while you are in the shower, use a plastic, nonslip stool. Install grab bars by the toilet and in the tub and shower. Do not use towel bars as grab bars. What can I do in the bedroom? Make sure that a bedside light is easy to reach. Do not use oversized bedding that reaches the floor. Have a firm chair that has side arms to use for getting dressed. What can I do in the kitchen? Clean up any spills right away. If you need to reach for something above you,  use a sturdy step stool that has a grab bar. Keep electrical cables out of the way. Do not use floor polish or wax that makes floors slippery. If you must use wax, make sure that it is non-skid floor wax. What can I do with my stairs? Do not leave any items on the stairs. Make sure that you have a light switch at the top and the bottom of the stairs. Have them installed if you do not have them. Make sure that there are handrails on both sides of the stairs. Fix handrails that are broken or loose. Make sure that handrails are as long as the staircases. Install non-slip stair treads on all stairs in your home. Avoid having throw rugs at the top or bottom of stairs, or secure the rugs with carpet tape to prevent them from moving. Choose a carpet design that does not hide the edge of steps on the stairs. Check any carpeting to make sure that it is firmly attached to the stairs. Fix any carpet that is loose or worn. What can I do on the outside of my home? Use bright outdoor lighting. Regularly repair the edges of walkways and driveways and fix any cracks. Remove high doorway thresholds. Trim any shrubbery on the main path into your home. Regularly check that handrails are securely fastened and in good repair. Both sides of all steps should have handrails. Install guardrails along   the edges of any raised decks or porches. Clear walkways of debris and clutter, including tools and rocks. Have leaves, snow, and ice cleared regularly. Use sand or salt on walkways during winter months. In the garage, clean up any spills right away, including grease or oil spills. What other actions can I take? Wear closed-toe shoes that fit well and support your feet. Wear shoes that have rubber soles or low heels. Use mobility aids as needed, such as canes, walkers, scooters, and crutches. Review your medicines with your health care provider. Some medicines can cause dizziness or changes in blood pressure, which  increase your risk of falling. Talk with your health care provider about other ways that you can decrease your risk of falls. This may include working with a physical therapist or trainer to improve your strength, balance, and endurance. Where to find more information Centers for Disease Control and Prevention, [employer]: www.cdc.gov National Institute on Aging: www.nia.nih.gov Contact a health care provider if: You are afraid of falling at home. You feel weak, drowsy, or dizzy at home. You fall at home. Summary There are many simple things that you can do to make your home safe and to help prevent falls. Ways to make your home safe include removing tripping hazards and installing grab bars in the bathroom. Ask for help when making these changes in your home. This information is not intended to replace advice given to you by your health care provider. Make sure you discuss any questions you have with your health care provider. Document Revised: 06/01/2021 Document Reviewed: 04/02/2020 Elsevier Patient Education  2023 Elsevier Inc.  

## 2022-07-23 NOTE — Progress Notes (Signed)
Subjective:    Patient ID: Todd Zhang, male    DOB: 14-Aug-1946, 76 y.o.   MRN: [id number]  Chief Complaint  Patient presents with   ER Follow up     From a fall   PT presents to the office today for hospital follow up. He went to the ED on 07/21/22  after a fall and had chest wall pain, left knee pain, closed fracture of left tibial plateau, and  left rib fracture.   He reports he was walking and his "legs gave out" and fell on both knees. He reports he was on ground for 18 hours.   He reports his pain is a 9 out 10 in his knee. He has an appointment with Ortho next week.   His creatinine was elevated.  Fall The accident occurred 3 to 5 days ago. There was no blood loss. The point of impact was the left knee and right knee. The pain is at a severity of 9/10. The pain is moderate. Pertinent negatives include no bowel incontinence, fever, hematuria, loss of consciousness, numbness, tingling or visual change. He has tried NSAID and rest for the symptoms. The treatment provided mild relief.      Review of Systems  Constitutional:  Negative for fever.  Gastrointestinal:  Negative for bowel incontinence.  Genitourinary:  Negative for hematuria.  Neurological:  Negative for tingling, loss of consciousness and numbness.  All other systems reviewed and are negative.      Objective:   Physical Exam Vitals reviewed.  Constitutional:      General: He is not in acute distress.    Appearance: He is well-developed. He is obese.  HENT:     Head: Normocephalic.  Eyes:     General:        Right eye: No discharge.        Left eye: No discharge.     Pupils: Pupils are equal, round, and reactive to light.  Neck:     Thyroid: No thyromegaly.  Cardiovascular:     Rate and Rhythm: Normal rate and regular rhythm.     Heart sounds: Normal heart sounds. No murmur heard. Pulmonary:     Effort: Pulmonary effort is normal. No respiratory distress.     Breath sounds: Normal breath sounds.  No wheezing.  Abdominal:     General: Bowel sounds are normal. There is no distension.     Palpations: Abdomen is soft.     Tenderness: There is no abdominal tenderness.  Musculoskeletal:        General: No tenderness.     Cervical back: Normal range of motion and neck supple.  Skin:    General: Skin is warm and dry.     Findings: No erythema or rash.  Neurological:     Mental Status: He is alert and oriented to person, place, and time.     Cranial Nerves: No cranial nerve deficit.     Motor: Weakness present.     Gait: Gait abnormal.     Deep Tendon Reflexes: Reflexes are normal and symmetric.  Psychiatric:        Behavior: Behavior normal.        Thought Content: Thought content normal.        Judgment: Judgment normal.      BP (!) 97/59   Pulse 85   Temp 97.6 F (36.4 C) (Temporal)   Ht 6' (1.829 m)   Wt 226 lb (102.5 kg)   SpO2 98%  BMI 30.65 kg/m       Assessment & Plan:  Todd Zhang comes in today with chief complaint of ER Follow up  (From a fall)   Diagnosis and orders addressed:  1. Closed fracture of left tibial plateau with routine healing, subsequent encounter - CMP14+EGFR - CBC with Differential/Platelet - Ambulatory referral to [place]  2. Fall in home, subsequent encounter - CMP14+EGFR - CBC with Differential/Platelet - Ambulatory referral to [place]  3. Closed fracture of one rib of left side with routine healing, subsequent encounter - CMP14+EGFR - CBC with Differential/Platelet - Ambulatory referral to [place]  4. Stage 3b chronic kidney disease ([place]) - CMP14+EGFR - CBC with Differential/Platelet - Ambulatory referral to [place] pending Health Maintenance reviewed Diet and exercise encouraged  Follow up plan: Keep follow up with PCP and Home health referral placed. Keep Ortho follow up.    Evelina Dun, FNP

## 2022-07-24 LAB — CBC WITH DIFFERENTIAL/PLATELET
Basophils Absolute: 0 10*3/uL (ref 0.0–0.2)
Basos: 0 %
EOS (ABSOLUTE): 0.1 10*3/uL (ref 0.0–0.4)
Eos: 1 %
Hematocrit: 36.7 % — ABNORMAL LOW (ref 37.5–51.0)
Hemoglobin: 12.4 g/dL — ABNORMAL LOW (ref 13.0–17.7)
Immature Grans (Abs): 0 10*3/uL (ref 0.0–0.1)
Immature Granulocytes: 0 %
Lymphocytes Absolute: 1.9 10*3/uL (ref 0.7–3.1)
Lymphs: 25 %
MCH: 30.2 pg (ref 26.6–33.0)
MCHC: 33.8 g/dL (ref 31.5–35.7)
MCV: 90 fL (ref 79–97)
Monocytes Absolute: 0.6 10*3/uL (ref 0.1–0.9)
Monocytes: 7 %
Neutrophils Absolute: 5 10*3/uL (ref 1.4–7.0)
Neutrophils: 67 %
Platelets: 186 10*3/uL (ref 150–450)
RBC: 4.1 x10E6/uL — ABNORMAL LOW (ref 4.14–5.80)
RDW: 12.6 % (ref 11.6–15.4)
WBC: 7.6 10*3/uL (ref 3.4–10.8)

## 2022-07-24 LAB — CMP14+EGFR
ALT: 40 IU/L (ref 0–44)
AST: 55 IU/L — ABNORMAL HIGH (ref 0–40)
Albumin/Globulin Ratio: 1.6 (ref 1.2–2.2)
Albumin: 4 g/dL (ref 3.8–4.8)
Alkaline Phosphatase: 53 IU/L (ref 44–121)
BUN/Creatinine Ratio: 19 (ref 10–24)
BUN: 42 mg/dL — ABNORMAL HIGH (ref 8–27)
Bilirubin Total: 0.4 mg/dL (ref 0.0–1.2)
CO2: 21 mmol/L (ref 20–29)
Calcium: 9.3 mg/dL (ref 8.6–10.2)
Chloride: 98 mmol/L (ref 96–106)
Creatinine, Ser: 2.19 mg/dL — ABNORMAL HIGH (ref 0.76–1.27)
Globulin, Total: 2.5 g/dL (ref 1.5–4.5)
Glucose: 277 mg/dL — ABNORMAL HIGH (ref 70–99)
Potassium: 4.6 mmol/L (ref 3.5–5.2)
Sodium: 135 mmol/L (ref 134–144)
Total Protein: 6.5 g/dL (ref 6.0–8.5)
eGFR: 30 mL/min/{1.73_m2} — ABNORMAL LOW (ref >59)

## 2022-07-26 ENCOUNTER — Other Ambulatory Visit: Payer: Self-pay | Admitting: Family

## 2022-07-26 DIAGNOSIS — N1832 Chronic kidney disease, stage 3b: Secondary | ICD-10-CM

## 2022-07-27 ENCOUNTER — Telehealth: Payer: Self-pay

## 2022-07-27 LAB — HM DIABETES EYE EXAM

## 2022-07-27 NOTE — Telephone Encounter (Signed)
Transition Care Management Follow-up Telephone Call Date of discharge and from where: 07/24/2022 [facility] ED  How have you been since you were released from the hospital? Patient went back to ED as pain had gotten worse and hydrocodone was not helping. Patient was given Oxycodone #10 but states that he is still having a lot of pain in the rib area  Any questions or concerns? Patient will be out of pain medication before ortho appointment on 07/29/2022  Items Reviewed: Did the pt receive and understand the discharge instructions provided? Yes  Medications obtained and verified? Yes  Other? No   Any new allergies since your discharge? No  Dietary orders reviewed? Yes Do you have support at home? Yes   Home Care and Equipment/Supplies: Were home health services ordered? not applicable If so, what is the name of the agency? na  Has the agency set up a time to come to the patient's home? not applicable Were any new equipment or medical supplies ordered?  No What is the name of the medical supply agency? na Were you able to get the supplies/equipment? not applicable Do you have any questions related to the use of the equipment or supplies? No  Functional Questionnaire: (I = Independent and D = Dependent) ADLs: patient is still doing but is having difficulty with anything that her has to move   Bathing/Dressing- difficulty   Meal Prep- i  Eating- i  Maintaining continence- i  Transferring/Ambulation- difficulty   Managing Meds- i  Follow up appointments reviewed:  PCP Hospital f/u appt confirmed? Yes  Scheduled to see Daphine Deutscher on 08/03/2022 @ 1030am . Specialist [facility] f/u appt confirmed? Yes  Scheduled to see Ortho on 07/29/2022 and see Cardiology on 07/28/22 @ 930am  Are transportation arrangements needed? No  If their condition worsens, is the pt aware to call PCP or go to the Emergency Dept.? Yes Was the patient provided with contact information for the PCP's office or  ED? Yes Was to pt encouraged to call back with questions or concerns? Yes

## 2022-07-28 ENCOUNTER — Ambulatory Visit: Payer: Medicare Other | Attending: Cardiology | Admitting: Internal Medicine

## 2022-07-28 ENCOUNTER — Encounter: Payer: Self-pay | Admitting: *Deleted

## 2022-07-28 ENCOUNTER — Encounter: Payer: Self-pay | Admitting: Internal Medicine

## 2022-07-28 VITALS — BP 98/72 | HR 76 | Ht 72.0 in | Wt 225.8 lb

## 2022-07-28 DIAGNOSIS — R0789 Other chest pain: Secondary | ICD-10-CM

## 2022-07-28 DIAGNOSIS — E7849 Other hyperlipidemia: Secondary | ICD-10-CM | POA: Diagnosis not present

## 2022-07-28 DIAGNOSIS — E785 Hyperlipidemia, unspecified: Secondary | ICD-10-CM | POA: Insufficient documentation

## 2022-07-28 DIAGNOSIS — I10 Essential (primary) hypertension: Secondary | ICD-10-CM | POA: Diagnosis not present

## 2022-07-28 MED ORDER — FUROSEMIDE 40 MG PO TABS: 40.0000 mg | ORAL_TABLET | Freq: Two times a day (BID) | ORAL | 3 refills | Status: DC

## 2022-07-28 MED ORDER — METOPROLOL TARTRATE 25 MG PO TABS: 25.0000 mg | ORAL_TABLET | Freq: Two times a day (BID) | ORAL | 3 refills | Status: DC

## 2022-07-28 MED ORDER — LISINOPRIL 2.5 MG PO TABS: 2.5000 mg | ORAL_TABLET | Freq: Every day | ORAL | 3 refills | Status: DC

## 2022-07-28 MED ORDER — NITROGLYCERIN 0.4 MG SL SUBL: 0.4000 mg | SUBLINGUAL_TABLET | SUBLINGUAL | 2 refills | Status: DC | PRN

## 2022-07-28 NOTE — Progress Notes (Signed)
Cardiology Office Note  Date: 07/28/2022   ID: Todd Zhang, DOB 10/18/1945, MRN [id number]  PCP:  [name], FNP  Cardiologist:  None Electrophysiologist:  None   Reason for Office Visit: Evaluation of A-fib at the request of Todd Deutscher, FNP  History of Present Illness: Todd Zhang is a 76 y.o. 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  Patient reported daily palpitations for almost 1 year associated with worsening DOE.  EKG today showed A-fib with RVR, HR 111. He also reported having dull chest discomfort lasting for an hour or so, occurring at random times resolving spontaneously. Frequency has been once a month. Otherwise denied other symptoms like dizziness, syncope.  No family history of premature ASCVD.  Past Medical History:  Diagnosis Date   Atrial fibrillation (HCC)    Cervical spinal stenosis    DDD (degenerative disc disease), cervical    DDD (degenerative disc disease), lumbar    Diabetes mellitus without complication (HCC)    Hyperlipidemia    Hypertension    Peripheral artery disease (HCC)     Past Surgical History:  Procedure Laterality Date   ABDOMINAL AORTOGRAM W/LOWER EXTREMITY N/A 04/30/2021   Procedure: ABDOMINAL AORTOGRAM W/LOWER EXTREMITY;  Surgeon: Leonie Douglas, MD;  Location: [facility] INVASIVE [facility];  Service: Cardiovascular;  Laterality: N/A;   AMPUTATION TOE     AMPUTATION TOE Left 05/04/2021   Procedure: LEFT GREAT TOE AMPUTATION;  Surgeon: [name], MD;  Location: [facility] OR;  Service: Vascular;  Laterality: Left;   BYPASS GRAFT FEMORAL-PERONEAL Left 05/04/2021   Procedure: LEFT COMMON FEMORAL-TIBIOPERONEAL TRUNK BYPASS;  Surgeon:  [name], MD;  Location: [facility] OR;  Service: Vascular;  Laterality: Left;   ENDARTERECTOMY FEMORAL Left 05/04/2021   Procedure: LEFT COMMON FEMORAL ENDARTERECTOMY WITH BOVINE PATCH;  Surgeon: [name], MD;  Location: [facility] OR;  Service: Vascular;  Laterality: Left;   ENDARTERECTOMY TIBIOPERONEAL Left 05/04/2021   Procedure: LEFT BELOW KNEE POPITEAL WITH  TIBIOPERONEAL ENDARTERECTOMY;  Surgeon: [name], MD;  Location: [facility] OR;  Service: Vascular;  Laterality: Left;   ENDOVEIN HARVEST OF GREATER SAPHENOUS VEIN  05/04/2021   Procedure: HARVEST OF LEFT GREATER SAPHENOUS VEIN;  Surgeon: [name], MD;  Location: [facility] OR;  Service: Vascular;;   INSERTION OF ILIAC STENT Left 05/04/2021   Procedure: LEFT COMMON ILLIAC ATERY ANGIOPLASTY WITH STENT;  Surgeon: [name], MD;  Location: [facility] OR;  Service: Vascular;  Laterality: Left;   INTRAOPERATIVE ARTERIOGRAM Left 05/04/2021   Procedure: LEFT ARTERY ARTERIOGRAM;  Surgeon: [name], MD;  Location: [facility] OR;  Service: Vascular;  Laterality: Left;   IRRIGATION AND DEBRIDEMENT FOOT Left 05/04/2021   Procedure: IRRIGATION AND DEBRIDEMENT FOOT;  Surgeon: [name], MD;  Location: [facility] OR;  Service: Vascular;  Laterality: Left;   KNEE SURGERY Left    NOSE SURGERY     TEE WITHOUT CARDIOVERSION N/A 05/01/2021   Procedure: TRANSESOPHAGEAL ECHOCARDIOGRAM (TEE);  Surgeon: Chilton Si, MD;  Location: [facility] ENDOSCOPY;  Service: Cardiovascular;  Laterality: N/A;    Current Outpatient Medications  Medication Sig Dispense Refill   alendronate (FOSAMAX) 70 MG tablet TAKE 1 TABLET BY MOUTH ONCE  WEEKLY ON SUNDAY ON ON AN EMPTY STOMACH with full glass of water 12 tablet 0   atorvastatin (LIPITOR) 40 MG tablet Take 1 tablet (40 mg total) by mouth daily. 90 tablet 1   benazepril (LOTENSIN) 5 MG tablet Take 1 tablet (5 mg total) by mouth daily. 90 tablet 1   cetirizine (ZYRTEC) 10 MG tablet Take 10 mg by mouth  daily.     clopidogrel (PLAVIX) 75 MG tablet Take 1 tablet (75 mg total) by mouth daily. 90 tablet 1   collagenase (SANTYL) ointment Apply 1 application topically daily. 30 g 1   fenofibrate (TRICOR) 145 MG tablet Take 1 tablet (145 mg total) by mouth daily. 90 tablet 1   furosemide (LASIX) 20 MG tablet Take 1 tablet (20 mg total) by mouth daily. 90 tablet 1   gabapentin (NEURONTIN) 300 MG capsule Take 1 capsule (300 mg total) by mouth 4 (four) times daily. TAKE 1 CAPSULE BY MOUTH THREE TIMES DAILY FOR PAIN 120 capsule 5   glucose blood (ACCU-CHEK AVIVA PLUS) test strip CHECK BLOOD SUGAR FOUR TIMES DAILY Dx E11.40 400 strip 3   insulin regular (HUMULIN R) 100 units/mL injection INJECT 10 UNITS INTO THE SKIN THREE TIMES DAILY BEFORE MEALS 30 mL 1   Insulin Syringe-Needle U-100 (GLOBAL INJECT EASE INSULIN SYR) 31G X 5/16" 0.3 ML MISC USE WITH INSULIN EVERY DAY Dx E11.40 100 each 3   metFORMIN (GLUCOPHAGE) 500 MG tablet TAKE 1 TABLET BY MOUTH TWICE DAILY 180 tablet 0   metoprolol succinate (TOPROL-XL) 25 MG 24 hr tablet Take 1 tablet (25 mg total) by mouth daily. 90 tablet 1   Multiple Vitamin (MULTIVITAMIN) tablet Take 1 tablet by mouth daily.     mupirocin ointment (BACTROBAN) 2 % Apply 1 application topically daily. Left Great Toe     pantoprazole (PROTONIX) 40 MG tablet Take 1 tablet (40 mg total) by mouth daily. 90 tablet 1   Rivaroxaban (XARELTO) 15 MG TABS tablet Take 1 tablet (15 mg total) by mouth daily with supper. 90 tablet 1   tamsulosin (FLOMAX) 0.4 MG CAPS capsule Take 1 capsule (0.4 mg total) by mouth daily. 90 capsule 1   triamcinolone cream (KENALOG) 0.5 % APPLY TO THE AFFECTED AREA(S) THREE TIMES DAILY     vitamin B-12 (CYANOCOBALAMIN) 500 MCG tablet Take 500 mcg by mouth daily.     No current facility-administered medications for this visit.   Allergies:  Asa [aspirin], Penicillins, and Tetanus toxoid   Social History: The patient  reports that he quit smoking about 11 years  ago. His smoking use included cigarettes. He started smoking about 56 years ago. He smoked an average of 1 pack per day. He has never used smokeless tobacco. He reports that he does not currently use drugs. He reports that he does not drink alcohol.   Family History: The patient's family history includes Cancer in his father; Heart disease in his mother; Stroke in his mother.   ROS:  Please see the history of present illness. Otherwise, complete review of systems is positive for none.  All other systems are reviewed and negative.   Physical Exam: VS:  There were no vitals taken for this visit., BMI There is no height or weight on file to calculate BMI.  Wt Readings from Last 3 Encounters:  07/23/22 226 lb (102.5 kg)  05/07/22 217 lb (98.4 kg)  02/04/22 212 lb (96.2 kg)    General: Patient appears comfortable at rest. HEENT: Conjunctiva and lids normal,  oropharynx clear with moist mucosa. Neck: Supple, no elevated JVP or carotid bruits, no thyromegaly. Lungs: Clear to auscultation, nonlabored breathing at rest. Cardiac: Regular rate and rhythm, no S3 or significant systolic murmur, no pericardial rub. Abdomen: Soft, nontender, no hepatomegaly, bowel sounds present, no guarding or rebound. Extremities: No pitting edema, distal pulses 2+. Skin: Warm and dry. Musculoskeletal: No kyphosis. Neuropsychiatric: Alert and oriented x3, affect grossly appropriate.  ECG:  An ECG dated 07/28/2022 was personally reviewed today and demonstrated:  Normal sinus rhythm  Recent Labwork: 10/30/2021: TSH 2.310 07/23/2022: ALT 40; AST 55; BUN 42; Creatinine, Ser 2.19; Hemoglobin 12.4; Platelets 186; Potassium 4.6; Sodium 135     Component Value Date/Time   CHOL 186 05/07/2022 1142   TRIG 160 (H) 05/07/2022 1142   HDL 49 05/07/2022 1142   CHOLHDL 3.8 05/07/2022 1142   CHOLHDL 5.4 05/04/2021 2353   VLDL 34 05/04/2021 2353   LDLCALC 109 (H) 05/07/2022 1142    Other Studies Reviewed Today: Echo from  2022 LVEF 60 to 65% Diastology could not be evaluated No valvular abnormalities  Assessment and Plan: Patient is a 76 year old M known to have paroxysmal A-fib status post DCCV in 2016, 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  #Paroxysmal A-fib with RVR s/p DCCV in 2016 -EKG today showed A-fib with RVR, HR 111 associated with palpitations and DOE. Blood pressure is soft and I strongly recommended ER visit for management of A-fib with RVR.  Patient refused ER/hospital visit at this time as he has appointments lined up in the next 1 week to 10 days and does not want to miss them. Start metoprolol tartrate 25 mg twice daily and continue Xarelto 15 mg once daily with supper. I increased his Lasix dose from 20 mg once daily to 40 mg twice daily for DOE. -I will schedule him for TEE guided DCCV in 2 weeks. The risks, benefits and alternatives for the procedure (TEE guided DCCV )were discussed and the patient comprehended these risks.  Risks include, but are not limited to, cough, sore throat, vomiting, nausea, somnolence, esophageal and stomach trauma or perforation, bleeding, low blood pressure, aspiration, pneumonia, infection, trauma to the teeth, embolic stroke, cardiac arrest and PPM implantation. Patient verbalized understanding and is willing to proceed. Patient is scheduled for TEE guided DCCV on 08/11/2022 at 9:30 AM.  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 Goal LDL less than 70. -Follow-up with  vascular surgery  #HLD, not at goal (LDL 109 in 08/23) -Continue atorvastatin 40 mg nightly -Continue fenofibrate 145 mg once daily. No myalgia with statins. -Add Zetia 10 mg once daily as patient is currently not at goal.  #HTN -Increase Lasix from 20 once daily to 40 mg twice a day -Start metoprolol tartrate 25 mg twice a day -Switch benazepril 5 mg to lisinopril 2.5 mg once daily  I have spent a total of 45 minutes with patient reviewing chart, EKGs, labs and examining patient as well as establishing an assessment and plan that was discussed  with the patient.  > 50% of time was spent in direct patient care.    Medication Adjustments/Labs and Tests Ordered: Current medicines are reviewed at length with the patient today.  Concerns regarding medicines are outlined above.   Tests Ordered: No orders of the defined types were placed in this encounter.   Medication Changes: No orders of the defined types were placed in this encounter.   Disposition:  Follow up  6 months  Signed Guss Farruggia Verne Spurr, MD, 07/28/2022 9:05 AM    [facility] at [facility] [address] [place], [place], [place] [address]

## 2022-07-28 NOTE — Patient Instructions (Addendum)
Medication Instructions:  Your physician has recommended you make the following change in your medication:  Stop benazepril Stop metoprolol succinate Start lisinopril 2.5 mg daily Start metoprolol tartrate 25 mg twice daily Increase furosemide to 40 mg twice daily Nitroglycerin 0.4 mg SL tablets sent. Dissolve one under tongue for chest pain every 5 minutes up to 3 doses. If no relief, proceed to ED. Continue other medications the same  Labwork: none  Testing/Procedures: Your physician has requested that you have a TEE/Cardioversion. During a TEE, sound waves are used to create images of your heart. It provides your doctor with information about the size and shape of your heart and how well your heart's chambers and valves are working. In this test, a transducer is attached to the end of a flexible tube that is guided down you throat and into your esophagus (the tube leading from your mouth to your stomach) to get a more detailed image of your heart. Once the TEE has determined that a blood clot is not present, the cardioversion begins. Electrical Cardioversion uses a jolt of electricity to your heart either through paddles or wired patches attached to your chest. This is a controlled, usually prescheduled, procedure. This procedure is done at the hospital and you are not awake during the procedure. You usually go home the day of the procedure. Please see the instruction sheet given to you today for more information.  Follow-Up: Your physician recommends that you schedule a follow-up appointment in: 1 month  Any Other Special Instructions Will Be Listed Below (If Applicable).  If you need a refill on your cardiac medications before your next appointment, please call your pharmacy.

## 2022-07-30 ENCOUNTER — Other Ambulatory Visit: Payer: Self-pay | Admitting: Family

## 2022-07-30 DIAGNOSIS — R296 Repeated falls: Secondary | ICD-10-CM

## 2022-07-30 DIAGNOSIS — R531 Weakness: Secondary | ICD-10-CM

## 2022-07-30 DIAGNOSIS — R0789 Other chest pain: Secondary | ICD-10-CM | POA: Insufficient documentation

## 2022-07-30 MED ORDER — EZETIMIBE 10 MG PO TABS: 10.0000 mg | ORAL_TABLET | Freq: Every day | ORAL | 3 refills | Status: DC

## 2022-07-30 NOTE — Progress Notes (Signed)
Home Health was denies because of insurance. Referral to PT placed. Please make sure he has a follow up with PCP.

## 2022-07-30 NOTE — Progress Notes (Signed)
Lmtcb.

## 2022-08-03 ENCOUNTER — Ambulatory Visit (INDEPENDENT_AMBULATORY_CARE_PROVIDER_SITE_OTHER): Payer: Medicare Other | Admitting: Nurse Practitioner

## 2022-08-03 ENCOUNTER — Encounter: Payer: Self-pay | Admitting: Nurse Practitioner

## 2022-08-03 VITALS — BP 133/77 | HR 86 | Temp 97.8°F | Resp 20 | Ht 72.0 in | Wt 219.0 lb

## 2022-08-03 DIAGNOSIS — I10 Essential (primary) hypertension: Secondary | ICD-10-CM

## 2022-08-03 DIAGNOSIS — N401 Enlarged prostate with lower urinary tract symptoms: Secondary | ICD-10-CM

## 2022-08-03 DIAGNOSIS — N1832 Chronic kidney disease, stage 3b: Secondary | ICD-10-CM

## 2022-08-03 DIAGNOSIS — Z6829 Body mass index (BMI) 29.0-29.9, adult: Secondary | ICD-10-CM

## 2022-08-03 DIAGNOSIS — E7800 Pure hypercholesterolemia, unspecified: Secondary | ICD-10-CM

## 2022-08-03 DIAGNOSIS — F411 Generalized anxiety disorder: Secondary | ICD-10-CM

## 2022-08-03 DIAGNOSIS — I4891 Unspecified atrial fibrillation: Secondary | ICD-10-CM | POA: Diagnosis not present

## 2022-08-03 DIAGNOSIS — M5030 Other cervical disc degeneration, unspecified cervical region: Secondary | ICD-10-CM

## 2022-08-03 DIAGNOSIS — J441 Chronic obstructive pulmonary disease with (acute) exacerbation: Secondary | ICD-10-CM

## 2022-08-03 DIAGNOSIS — Z794 Long term (current) use of insulin: Secondary | ICD-10-CM

## 2022-08-03 DIAGNOSIS — E1140 Type 2 diabetes mellitus with diabetic neuropathy, unspecified: Secondary | ICD-10-CM

## 2022-08-03 DIAGNOSIS — G8929 Other chronic pain: Secondary | ICD-10-CM

## 2022-08-03 DIAGNOSIS — M545 Low back pain, unspecified: Secondary | ICD-10-CM

## 2022-08-03 DIAGNOSIS — I739 Peripheral vascular disease, unspecified: Secondary | ICD-10-CM

## 2022-08-03 DIAGNOSIS — R3914 Feeling of incomplete bladder emptying: Secondary | ICD-10-CM

## 2022-08-03 DIAGNOSIS — E7849 Other hyperlipidemia: Secondary | ICD-10-CM

## 2022-08-03 DIAGNOSIS — K219 Gastro-esophageal reflux disease without esophagitis: Secondary | ICD-10-CM

## 2022-08-03 LAB — CBC WITH DIFFERENTIAL/PLATELET
Basophils Absolute: 0.1 10*3/uL (ref 0.0–0.2)
Basos: 1 %
EOS (ABSOLUTE): 0.1 10*3/uL (ref 0.0–0.4)
Eos: 2 %
Hematocrit: 40.1 % (ref 37.5–51.0)
Hemoglobin: 13.6 g/dL (ref 13.0–17.7)
Immature Grans (Abs): 0 10*3/uL (ref 0.0–0.1)
Immature Granulocytes: 1 %
Lymphocytes Absolute: 1.8 10*3/uL (ref 0.7–3.1)
Lymphs: 23 %
MCH: 30.5 pg (ref 26.6–33.0)
MCHC: 33.9 g/dL (ref 31.5–35.7)
MCV: 90 fL (ref 79–97)
Monocytes Absolute: 0.4 10*3/uL (ref 0.1–0.9)
Monocytes: 6 %
Neutrophils Absolute: 5.4 10*3/uL (ref 1.4–7.0)
Neutrophils: 67 %
Platelets: 317 10*3/uL (ref 150–450)
RBC: 4.46 x10E6/uL (ref 4.14–5.80)
RDW: 12.5 % (ref 11.6–15.4)
WBC: 7.9 10*3/uL (ref 3.4–10.8)

## 2022-08-03 LAB — CMP14+EGFR
ALT: 18 IU/L (ref 0–44)
AST: 21 IU/L (ref 0–40)
Albumin/Globulin Ratio: 1.7 (ref 1.2–2.2)
Albumin: 4.4 g/dL (ref 3.8–4.8)
Alkaline Phosphatase: 51 IU/L (ref 44–121)
BUN/Creatinine Ratio: 22 (ref 10–24)
BUN: 37 mg/dL — ABNORMAL HIGH (ref 8–27)
Bilirubin Total: 0.3 mg/dL (ref 0.0–1.2)
CO2: 25 mmol/L (ref 20–29)
Calcium: 9.8 mg/dL (ref 8.6–10.2)
Chloride: 100 mmol/L (ref 96–106)
Creatinine, Ser: 1.68 mg/dL — ABNORMAL HIGH (ref 0.76–1.27)
Globulin, Total: 2.6 g/dL (ref 1.5–4.5)
Glucose: 203 mg/dL — ABNORMAL HIGH (ref 70–99)
Potassium: 5.7 mmol/L — ABNORMAL HIGH (ref 3.5–5.2)
Sodium: 136 mmol/L (ref 134–144)
Total Protein: 7 g/dL (ref 6.0–8.5)
eGFR: 42 mL/min/{1.73_m2} — ABNORMAL LOW (ref >59)

## 2022-08-03 LAB — LIPID PANEL
Chol/HDL Ratio: 5 ratio (ref 0.0–5.0)
Cholesterol, Total: 218 mg/dL — ABNORMAL HIGH (ref 100–199)
HDL: 44 mg/dL (ref >39)
LDL Chol Calc (NIH): 137 mg/dL — ABNORMAL HIGH (ref 0–99)
Triglycerides: 204 mg/dL — ABNORMAL HIGH (ref 0–149)
VLDL Cholesterol Cal: 37 mg/dL (ref 5–40)

## 2022-08-03 LAB — BAYER DCA HB A1C WAIVED: HB A1C (BAYER DCA - WAIVED): 8.5 % — ABNORMAL HIGH (ref 4.8–5.6)

## 2022-08-03 MED ORDER — FUROSEMIDE 40 MG PO TABS: 40.0000 mg | ORAL_TABLET | Freq: Two times a day (BID) | ORAL | 3 refills | Status: DC

## 2022-08-03 MED ORDER — CLOPIDOGREL BISULFATE 75 MG PO TABS: 75.0000 mg | ORAL_TABLET | Freq: Every day | ORAL | 1 refills | Status: DC

## 2022-08-03 MED ORDER — PANTOPRAZOLE SODIUM 40 MG PO TBEC: 40.0000 mg | DELAYED_RELEASE_TABLET | Freq: Every day | ORAL | 1 refills | Status: DC

## 2022-08-03 MED ORDER — TAMSULOSIN HCL 0.4 MG PO CAPS: 0.4000 mg | ORAL_CAPSULE | Freq: Every day | ORAL | 1 refills | Status: DC

## 2022-08-03 MED ORDER — FENOFIBRATE 145 MG PO TABS: 145.0000 mg | ORAL_TABLET | Freq: Every day | ORAL | 1 refills | Status: DC

## 2022-08-03 MED ORDER — METFORMIN HCL 500 MG PO TABS: 500.0000 mg | ORAL_TABLET | Freq: Two times a day (BID) | ORAL | 0 refills | Status: DC

## 2022-08-03 MED ORDER — METOPROLOL TARTRATE 25 MG PO TABS: 25.0000 mg | ORAL_TABLET | Freq: Two times a day (BID) | ORAL | 3 refills | Status: DC

## 2022-08-03 MED ORDER — GABAPENTIN 300 MG PO CAPS: 300.0000 mg | ORAL_CAPSULE | Freq: Four times a day (QID) | ORAL | 5 refills | Status: DC

## 2022-08-03 MED ORDER — RIVAROXABAN 15 MG PO TABS: 15.0000 mg | ORAL_TABLET | Freq: Every day | ORAL | 1 refills | Status: DC

## 2022-08-03 MED ORDER — EZETIMIBE 10 MG PO TABS: 10.0000 mg | ORAL_TABLET | Freq: Every day | ORAL | 3 refills | Status: DC

## 2022-08-03 MED ORDER — LISINOPRIL 2.5 MG PO TABS: 2.5000 mg | ORAL_TABLET | Freq: Every day | ORAL | 3 refills | Status: DC

## 2022-08-03 MED ORDER — ATORVASTATIN CALCIUM 40 MG PO TABS: 40.0000 mg | ORAL_TABLET | Freq: Every day | ORAL | 1 refills | Status: DC

## 2022-08-03 NOTE — Patient Instructions (Signed)

## 2022-08-03 NOTE — Progress Notes (Signed)
Subjective:    Patient ID: Todd Zhang, male    DOB: [birth year], 76 y.o.   MRN: [id number]   Chief Complaint: medical management of chronic issues     HPI:  Todd Zhang is a 76 y.o. who identifies as a male who was assigned male at birth.   Social history: Lives with: by hisself Work history: disability   Comes in today for follow up of the following chronic medical issues:  1. Essential hypertension No c/o chest pain, sob or headache. Does not check blood pressure at home. BP Readings from Last 3 Encounters:  07/28/22 98/72  07/23/22 (!) 97/59  05/07/22 (!) 109/59     2. Peripheral artery disease ([place]) Has lost a toe due to such poor circulation.  3. Atrial fibrillation with RVR (HCC) No c/o palpitations or heart racing. He has had some episodes. He saw cardiology and is scheduled for echo cardiogram in 2 weeks.  4. COPD exacerbation ([place]) Doing well currently had a cough several weeks ago and went to the hospital.  5. Gastroesophageal reflux disease without esophagitis Is on protonix and is doing well.  6. Type 2 diabetes mellitus with diabetic neuropathy, with long-term current use of insulin (HCC) Fasting blood sugars are running around 120-200. Eats alot of fruit which makes his blood sugars go up..he is on insulin . Suppose to take 10u 3x a day but most days will only do 1 shot. Lab Results  Component Value Date   HGBA1C 7.6 (H) 05/07/2022    -- 7. Other hyperlipidemia Does not watch diet and does no dedicated exercise  8. Stage 3b chronic kidney disease ([place]) No voiding issues.  Lab Results  Component Value Date   CREATININE 2.19 (H) 07/23/2022     9. Benign prostatic hyperplasia with incomplete bladder emptying Again no voiding issues Lab Results  Component Value Date   PSA1 0.7 10/30/2021   PSA1 0.5 08/24/2019   PSA1 0.6 11/28/2017      10. Generalized anxiety disorder    08/03/2022   10:29 AM 05/07/2022   11:33 AM 02/04/2022    11:33 AM 10/30/2021    8:48 AM  GAD 7 : Generalized Anxiety Score  Nervous, Anxious, on Edge 0 1 0 0  Control/stop worrying 0 1 0 0  Worry too much - different things 0 1 0 0  Trouble relaxing 0 1 0 0  Restless 0 1 0 0  Easily annoyed or irritable 0 1 0 0  Afraid - awful might happen 0 1 0 0  Total GAD 7 Score 0 7 0 0  Anxiety Difficulty Not difficult at all Not difficult at all Not difficult at all        New complaints: Has a rib fracture which causes pain when takes a deep breath.  Allergies  Allergen Reactions   Asa [Aspirin] Hives   Penicillins Hives    Hives + vomiting as an adult  - tolerated Ceftriaxone 04/2021   Tetanus Toxoid    Outpatient Encounter Medications as of 08/03/2022  Medication Sig   alendronate (FOSAMAX) 70 MG tablet TAKE 1 TABLET BY MOUTH ONCE WEEKLY ON SUNDAY ON ON AN EMPTY STOMACH with full glass of water   atorvastatin (LIPITOR) 40 MG tablet Take 1 tablet (40 mg total) by mouth daily.   cetirizine (ZYRTEC) 10 MG tablet Take 10 mg by mouth daily. (Patient not taking: Reported on 07/28/2022)   clopidogrel (PLAVIX) 75 MG tablet Take 1 tablet (75 mg  total) by mouth daily.   collagenase (SANTYL) ointment Apply 1 application topically daily. (Patient not taking: Reported on 07/28/2022)   ezetimibe (ZETIA) 10 MG tablet Take 1 tablet (10 mg total) by mouth daily.   fenofibrate (TRICOR) 145 MG tablet Take 1 tablet (145 mg total) by mouth daily.   furosemide (LASIX) 40 MG tablet Take 1 tablet (40 mg total) by mouth 2 (two) times daily.   gabapentin (NEURONTIN) 300 MG capsule Take 1 capsule (300 mg total) by mouth 4 (four) times daily. TAKE 1 CAPSULE BY MOUTH THREE TIMES DAILY FOR PAIN   glucose blood (ACCU-CHEK AVIVA PLUS) test strip CHECK BLOOD SUGAR FOUR TIMES DAILY Dx E11.40   insulin regular (HUMULIN R) 100 units/mL injection INJECT 10 UNITS INTO THE SKIN THREE TIMES DAILY BEFORE MEALS (Patient taking differently: INJECT 10 UNITS INTO THE SKIN THREE TIMES  DAILY BEFORE MEALS AS NEEDED)   Insulin Syringe-Needle U-100 (GLOBAL INJECT EASE INSULIN SYR) 31G X 5/16" 0.3 ML MISC USE WITH INSULIN EVERY DAY Dx E11.40   lisinopril (ZESTRIL) 2.5 MG tablet Take 1 tablet (2.5 mg total) by mouth daily.   metFORMIN (GLUCOPHAGE) 500 MG tablet TAKE 1 TABLET BY MOUTH TWICE DAILY   metoprolol tartrate (LOPRESSOR) 25 MG tablet Take 1 tablet (25 mg total) by mouth 2 (two) times daily.   Multiple Vitamin (MULTIVITAMIN) tablet Take 1 tablet by mouth daily. (Patient not taking: Reported on 07/28/2022)   mupirocin ointment (BACTROBAN) 2 % Apply 1 application topically daily. Left Great Toe (Patient not taking: Reported on 07/28/2022)   nitroGLYCERIN (NITROSTAT) 0.4 MG SL tablet Place 1 tablet (0.4 mg total) under the tongue every 5 (five) minutes x 3 doses as needed for chest pain (if no relief after 3rd dose, proceed to ED or call 911).   pantoprazole (PROTONIX) 40 MG tablet Take 1 tablet (40 mg total) by mouth daily.   Rivaroxaban (XARELTO) 15 MG TABS tablet Take 1 tablet (15 mg total) by mouth daily with supper.   tamsulosin (FLOMAX) 0.4 MG CAPS capsule Take 1 capsule (0.4 mg total) by mouth daily.   triamcinolone cream (KENALOG) 0.5 % APPLY TO THE AFFECTED AREA(S) THREE TIMES DAILY (Patient not taking: Reported on 07/28/2022)   vitamin B-12 (CYANOCOBALAMIN) 500 MCG tablet Take 500 mcg by mouth daily. (Patient not taking: Reported on 07/28/2022)   No facility-administered encounter medications on file as of 08/03/2022.    Past Surgical History:  Procedure Laterality Date   ABDOMINAL AORTOGRAM W/LOWER EXTREMITY N/A 04/30/2021   Procedure: ABDOMINAL AORTOGRAM W/LOWER EXTREMITY;  Surgeon: Hawken, Thomas N, MD;  Location: [facility];  Service: Cardiovascular;  Laterality: N/A;   AMPUTATION TOE     AMPUTATION TOE Left 05/04/2021   Procedure: LEFT GREAT TOE AMPUTATION;  Surgeon: Clark, Christopher J, MD;  Location: [facility] OR;  Service: Vascular;  Laterality: Left;    BYPASS GRAFT FEMORAL-PERONEAL Left 05/04/2021   Procedure: LEFT COMMON FEMORAL-TIBIOPERONEAL TRUNK BYPASS;  Surgeon: Clark, Christopher J, MD;  Location: [facility];  Service: Vascular;  Laterality: Left;   ENDARTERECTOMY FEMORAL Left 05/04/2021   Procedure: LEFT COMMON FEMORAL ENDARTERECTOMY WITH BOVINE PATCH;  Surgeon: Clark, Christopher J, MD;  Location: [facility];  Service: Vascular;  Laterality: Left;   ENDARTERECTOMY TIBIOPERONEAL Left 05/04/2021   Procedure: LEFT BELOW KNEE POPITEAL WITH  TIBIOPERONEAL ENDARTERECTOMY;  Surgeon: Clark, Christopher J, MD;  Location: [facility];  Service: Vascular;  Laterality: Left;   ENDOVEIN HARVEST OF GREATER SAPHENOUS VEIN  05/04/2021   Procedure: HARVEST   OF LEFT GREATER SAPHENOUS VEIN;  Surgeon: Clark, Christopher J, MD;  Location: [facility];  Service: Vascular;;   INSERTION OF ILIAC STENT Left 05/04/2021   Procedure: LEFT COMMON ILLIAC ATERY ANGIOPLASTY WITH STENT;  Surgeon: Clark, Christopher J, MD;  Location: [facility];  Service: Vascular;  Laterality: Left;   INTRAOPERATIVE ARTERIOGRAM Left 05/04/2021   Procedure: LEFT ARTERY ARTERIOGRAM;  Surgeon: Clark, Christopher J, MD;  Location: [facility];  Service: Vascular;  Laterality: Left;   IRRIGATION AND DEBRIDEMENT FOOT Left 05/04/2021   Procedure: IRRIGATION AND DEBRIDEMENT FOOT;  Surgeon: Clark, Christopher J, MD;  Location: [facility];  Service: Vascular;  Laterality: Left;   KNEE SURGERY Left    NOSE SURGERY     TEE WITHOUT CARDIOVERSION N/A 05/01/2021   Procedure: TRANSESOPHAGEAL ECHOCARDIOGRAM (TEE);  Surgeon: [place], Tiffany, MD;  Location: [facility];  Service: Cardiovascular;  Laterality: N/A;    Family History  Problem Relation Age of Onset   Stroke Mother    Heart disease Mother        CABG   Cancer Father       Controlled substance contract: n/a     Review of Systems  Constitutional:  Negative for diaphoresis.  Eyes:  Negative for pain.  Respiratory:  Negative for shortness of breath.   Cardiovascular:   Negative for chest pain, palpitations and leg swelling.  Gastrointestinal:  Negative for abdominal pain.  Endocrine: Negative for polydipsia.  Skin:  Negative for rash.  Neurological:  Negative for dizziness, weakness and headaches.  Hematological:  Does not bruise/bleed easily.  All other systems reviewed and are negative.      Objective:   Physical Exam Vitals and nursing note reviewed.  Constitutional:      Appearance: Normal appearance. He is well-developed.  HENT:     Head: Normocephalic.     Nose: Nose normal.     Mouth/Throat:     Mouth: Mucous membranes are moist.     Pharynx: Oropharynx is clear.  Eyes:     Pupils: Pupils are equal, round, and reactive to light.  Neck:     Thyroid: No thyroid mass or thyromegaly.     Vascular: No carotid bruit or JVD.     Trachea: Phonation normal.  Cardiovascular:     Rate and Rhythm: Normal rate and regular rhythm.  Pulmonary:     Effort: Pulmonary effort is normal. No respiratory distress.     Breath sounds: Normal breath sounds.  Abdominal:     General: Bowel sounds are normal.     Palpations: Abdomen is soft.     Tenderness: There is no abdominal tenderness.  Musculoskeletal:        General: Normal range of motion.     Cervical back: Normal range of motion and neck supple.     Comments: Right great toe and second toe missing  Left middle toe missing  Lymphadenopathy:     Cervical: No cervical adenopathy.  Skin:    General: Skin is warm and dry.  Neurological:     Mental Status: He is alert and oriented to person, place, and time.  Psychiatric:        Behavior: Behavior normal.        Thought Content: Thought content normal.        Judgment: Judgment normal.    BP 133/77   Pulse 86   Temp 97.8 F (36.6 C) (Temporal)   Resp 20   Ht 6' (1.829 m)   Wt 219 lb (  99.3 kg)   SpO2 100%   BMI 29.70 kg/m    Hgba1c 8.5%     Assessment & Plan:   Hulen C Bors comes in today with chief complaint of Medical  Management of Chronic Issues   Diagnosis and orders addressed:  1. Essential hypertension Low sodium diet - CBC with Differential/Platelet - CMP14+EGFR - furosemide (LASIX) 40 MG tablet; Take 1 tablet (40 mg total) by mouth 2 (two) times daily.  Dispense: 60 tablet; Refill: 3 - lisinopril (ZESTRIL) 2.5 MG tablet; Take 1 tablet (2.5 mg total) by mouth daily.  Dispense: 30 tablet; Refill: 3 - metoprolol tartrate (LOPRESSOR) 25 MG tablet; Take 1 tablet (25 mg total) by mouth 2 (two) times daily.  Dispense: 60 tablet; Refill: 3  2. Peripheral artery disease (HCC) Continue current meds - Rivaroxaban (XARELTO) 15 MG TABS tablet; Take 1 tablet (15 mg total) by mouth daily with supper.  Dispense: 90 tablet; Refill: 1 - clopidogrel (PLAVIX) 75 MG tablet; Take 1 tablet (75 mg total) by mouth daily.  Dispense: 90 tablet; Refill: 1  3. Atrial fibrillation with RVR (HCC) Keep appointment for echo cardiogram - Rivaroxaban (XARELTO) 15 MG TABS tablet; Take 1 tablet (15 mg total) by mouth daily with supper.  Dispense: 90 tablet; Refill: 1  4. COPD exacerbation (HCC)  5. Gastroesophageal reflux disease without esophagitis Avoid spicy foods Do not eat 2 hours prior to bedtime - pantoprazole (PROTONIX) 40 MG tablet; Take 1 tablet (40 mg total) by mouth daily.  Dispense: 90 tablet; Refill: 1  6. Type 2 diabetes mellitus with diabetic neuropathy, with long-term current use of insulin (HCC) Continue to watch carbs in diet Only uses insulin prn Will make appoitmnet with Clinical pharmacist - Bayer DCA Hb A1c Waived - metFORMIN (GLUCOPHAGE) 500 MG tablet; Take 1 tablet (500 mg total) by mouth 2 (two) times daily.  Dispense: 180 tablet; Refill: 0  7. Other hyperlipidemia Low fat diet - Lipid panel - ezetimibe (ZETIA) 10 MG tablet; Take 1 tablet (10 mg total) by mouth daily.  Dispense: 90 tablet; Refill: 3  8. Stage 3b chronic kidney disease (HCC) Labs pending  9. Benign prostatic hyperplasia with  incomplete bladder emptying - tamsulosin (FLOMAX) 0.4 MG CAPS capsule; Take 1 capsule (0.4 mg total) by mouth daily.  Dispense: 90 capsule; Refill: 1  10. Generalized anxiety disorder Stress management  11. BMI 29.0-29.9,adult Discussed diet and exercise for person with BMI >25 Will recheck weight in 3-6 months   12. DDD (degenerative disc disease), cervical Moist heat rest - gabapentin (NEURONTIN) 300 MG capsule; Take 1 capsule (300 mg total) by mouth 4 (four) times daily. TAKE 1 CAPSULE BY MOUTH THREE TIMES DAILY FOR PAIN  Dispense: 120 capsule; Refill: 5  13. Chronic low back pain without sciatica, unspecified back pain laterality - gabapentin (NEURONTIN) 300 MG capsule; Take 1 capsule (300 mg total) by mouth 4 (four) times daily. TAKE 1 CAPSULE BY MOUTH THREE TIMES DAILY FOR PAIN  Dispense: 120 capsule; Refill: 5  14. Pure hypercholesterolemia - atorvastatin (LIPITOR) 40 MG tablet; Take 1 tablet (40 mg total) by mouth daily.  Dispense: 90 tablet; Refill: 1 - fenofibrate (TRICOR) 145 MG tablet; Take 1 tablet (145 mg total) by mouth daily.  Dispense: 90 tablet; Refill: 1   Labs pending Health Maintenance reviewed Diet and exercise encouraged  Follow up plan: 3 months   [name] Martin, FNP  

## 2022-08-09 ENCOUNTER — Telehealth: Payer: Self-pay

## 2022-08-09 NOTE — Progress Notes (Signed)
  Chronic Care Management   Note  08/09/2022 Name: [name] MRN: [id number] DOB: March 22, 1946  Todd Zhang is a 76 y.o. year old male who is a primary care patient of [name], FNP. I reached out to [name] by phone today in response to a referral sent by Mr. KAYLA WEEKES PCP.  Mr. KINGSTIN HEIMS  agreedto scheduling an appointment with the CCM RN Case Manager   Follow up plan: Patient agreed to scheduled appointment with RN Case Manager on 08/30/2022(date/time).   Penne Lash, RMA Care Guide [facility]  [place], [place] [address] Direct Dial: [phone] [email or web address]

## 2022-08-09 NOTE — Patient Instructions (Signed)
Todd Zhang  08/09/2022     @PREFPERIOPPHARMACY @   Your procedure is scheduled on  08/11/2022.   Report to 08/13/2022 at  0730  A.M.   Call this number if you have problems the morning of surgery:  [phone]  If you experience any cold or flu symptoms such as cough, fever, chills, shortness of breath, etc. between now and your scheduled surgery, please notify [phone] at the above number.   Remember:  Do not eat or drink after midnight.      Take these medicines the morning of surgery with A SIP OF WATER               zyrtec, gabapentin, protonix, flomax.      Do not wear jewelry, make-up or nail polish.  Do not wear lotions, powders, or perfumes, or deodorant.  Do not shave 48 hours prior to surgery.  Men may shave face and neck.  Do not bring valuables to the hospital.  [facility] is not responsible for any belongings or valuables.  Contacts, dentures or bridgework may not be worn into surgery.  Leave your suitcase in the car.  After surgery it may be brought to your room.  For patients admitted to the hospital, discharge time will be determined by your treatment team.  Patients discharged the day of surgery will not be allowed to drive home and must have someone with them for 24 hours.    Special instructions:   DO NOT smoke tobacco or vape for 24 hours before your procedure.  Please read over the following fact sheets that you were given. Anesthesia Post-op Instructions and Care and Recovery After Surgery      Transesophageal Echocardiogram Transesophageal echocardiogram (TEE) is a test that uses sound waves to take pictures of your heart. TEE is done by passing a small probe attached to a flexible tube down the part of the body that moves food from your mouth to your stomach (esophagus). The pictures give clear images of your heart. This can help your doctor see if there are problems with your heart. Tell a doctor about: Any allergies you  have. All medicines you are taking. This includes vitamins, herbs, eye drops, creams, and over-the-counter medicines. Any problems you or family members have had with anesthetic medicines. Any blood disorders you have. Any surgeries you have had. Any medical conditions you have. Any swallowing problems. Whether you have or have had a blockage in the part of the body that moves food from your mouth to your stomach. Whether you are pregnant or may be pregnant. What are the risks? In general, this is a safe procedure. But, problems may occur, such as: Damage to nearby structures or organs. A tear in the part of the body that moves food from your mouth to your stomach. Irregular heartbeat. Hoarse voice or trouble swallowing. Bleeding. What happens before the procedure? Medicines Ask your doctor about changing or stopping: Your normal medicines. Vitamins, herbs, and supplements. Over-the-counter medicines. Do not take aspirin or ibuprofen unless you are told to. General instructions Follow instructions from your doctor about what you cannot eat or drink. You will take out any dentures or dental retainers. Plan to have a responsible adult take you home from the hospital or clinic. Plan to have a responsible adult care for you for the time you are told after you leave the hospital or clinic. This is important. What happens during the procedure?  An IV will be put into one of your veins. You may be given: A sedative. This medicine helps you relax. A medicine to numb the back of your throat. This may be sprayed or gargled. Your blood pressure, heart rate, and breathing will be watched. You may be asked to lie on your left side. A bite block will be placed in your mouth. This keeps you from biting the tube. The tip of the probe will be placed into the back of your mouth. You will be asked to swallow. Your doctor will take pictures of your heart. The probe and bite block will be taken  out after the test is done. The procedure may vary among doctors and hospitals. What can I expect after the procedure? You will be monitored until you leave the hospital or clinic. This includes checking your blood pressure, heart rate, breathing rate, and blood oxygen level. Your throat may feel sore and numb. This will get better over time. You will not be allowed to eat or drink until the numbness has gone away. It is common to have a sore throat for a day or two. It is up to you to get the results of your procedure. Ask how to get your results when they are ready. Follow these instructions at home: If you were given a sedative during your procedure, do not drive or use machines until your doctor says that it is safe. Return to your normal activities when your doctor says that it is safe. Keep all follow-up visits. Summary TEE is a test that uses sound waves to take pictures of your heart. You will be given a medicine to help you relax. Do not drive or use machines until your doctor says that it is safe. This information is not intended to replace advice given to you by your health care provider. Make sure you discuss any questions you have with your health care provider. Document Revised: 05/13/2021 Document Reviewed: 04/22/2020 Elsevier Patient Education  2023 [employer] Writerlectrical Cardioversion Electrical cardioversion is the delivery of a jolt of electricity to restore a normal rhythm to the heart. A rhythm that is too fast or is not regular keeps the heart from pumping well. There are two kinds of cardioversion. Pharmacologic (chemical) cardioversion is when your health care provider gives you one or more medicines to bring back your regular heartbeat. The second way to restore regular rhythms is by sending an electrical shock to the heart. This is called electrical cardioversion. Electrical cardioversion is done as a scheduled procedure for irregular or fast heart rhythms that are  not immediately life-threatening. Electrical cardioversion may also be done in an emergency for sudden life-threatening arrhythmias. Tell a health care provider about: Any allergies you have. All medicines you are taking, including vitamins, herbs, eye drops, creams, and over-the-counter medicines. Any problems you or family members have had with anesthetic medicines. Any bleeding problems you have. Any surgeries you have had, including a pacemaker, defibrillator, or other implanted device. Any medical conditions you have. Whether you are pregnant or may be pregnant. What are the risks? Your health care provider will talk with you about risks. These include: Allergic reactions to medicines. Irritation to the skin on your chest or back where the electrodes were applied during electrical cardioversion. A blood clot that breaks free and travels to other parts of your body, such as your brain. The possible return of a worse abnormal heart rhythm that will need to be treated with medicines, a  pacemaker, or an implantable cardioverter defibrillator (ICD). What happens before the procedure? Medicines Your health care provider may have you start taking: Blood-thinning medicines (anticoagulants) so your blood does not clot as easily. If your doctor gives you this medicine, you may need to take it for 3 to 4 weeks before the procedure. Medicines to help stabilize your heart rate and rhythm. Ask your health care provider about: Changing or stopping your regular medicines. These include any diabetes medicines or blood thinners you take. Taking medicines such as aspirin and ibuprofen. These medicines can thin your blood. Do not take them unless your health care provider tells you to. Taking over-the-counter medicines, vitamins, herbs, and supplements. General instructions Follow instructions from your health care provider about what you may eat and drink. Do not put any lotions, powders, or ointments on  your chest and back for 24 hours before the procedure. They can cause problems with the paddles used to deliver electricity to your heart. Do not wear jewelry as this can interfere with delivering electricity to your heart. If you will be going home right after the procedure, plan to have a responsible adult: Take you home from the hospital or clinic. You will not be allowed to drive. Care for you for the time you are told. Tests You may have an exam or testing. A transesophageal echocardiogram (TEE) may be performed if necessary (or preferred) to rule out left atrial blood clot. What happens during the procedure?     An IV will be inserted into one of your veins. Sticky patches (electrodes) or metal paddles may be placed on your chest. The electrodes or paddles will be placed in one of these ways: One on your right chest, the other on the left ribs. One may be placed on your chest and the other on your back. Or both can be placed on your chest. You may be given a sedative. This helps you relax. An electrical shock will be delivered. The shock briefly stops (resets) your heart rhythm. Your health care provider will check to see if your heartbeat is regular. Some people need only one shock. Some need more to restore a regular heartbeat. The procedure may vary among health care providers and hospitals. What happens after the procedure? Your blood pressure, heart rate, breathing rate, and blood oxygen level will be monitored until you leave the hospital or clinic. Your heart rhythm will be watched to make sure it does not change. Summary Electrical cardioversion is the delivery of a jolt of electricity to restore a normal rhythm to the heart. This procedure may be done as a scheduled procedure if the condition is not an emergency. You may have irritation to the skin on your chest or back where the electrodes were applied during the procedure. Your health care provider will check to see if  your heartbeat is regular. Some people need only one shock. Some need more to restore a regular heartbeat. Your blood pressure, heart rate, breathing rate, and blood oxygen level will be monitored until you leave the hospital or clinic. This information is not intended to replace advice given to you by your health care provider. Make sure you discuss any questions you have with your health care provider. Document Revised: 12/22/2021 Document Reviewed: 12/24/2021 Elsevier Patient Education  2023 [employer] Monitored Anesthesia Care, Care After The following information offers guidance on how to care for yourself after your procedure. Your health care provider may also give you more specific instructions. If you  have problems or questions, contact your health care provider. What can I expect after the procedure? After the procedure, it is common to have: Tiredness. Little or no memory about what happened during or after the procedure. Impaired judgment when it comes to making decisions. Nausea or vomiting. Some trouble with balance. Follow these instructions at home: For the time period you were told by your health care provider:  Rest. Do not participate in activities where you could fall or become injured. Do not drive or use machinery. Do not drink alcohol. Do not take sleeping pills or medicines that cause drowsiness. Do not make important decisions or sign legal documents. Do not take care of children on your own. Medicines Take over-the-counter and prescription medicines only as told by your health care provider. If you were prescribed antibiotics, take them as told by your health care provider. Do not stop using the antibiotic even if you start to feel better. Eating and drinking Follow instructions from your health care provider about what you may eat and drink. Drink enough fluid to keep your urine pale yellow. If you vomit: Drink clear fluids slowly and in small amounts as  you are able. Clear fluids include water, ice chips, low-calorie sports drinks, and fruit juice that has water added to it (diluted fruit juice). Eat light and bland foods in small amounts as you are able. These foods include bananas, applesauce, rice, lean meats, toast, and crackers. General instructions  Have a responsible adult stay with you for the time you are told. It is important to have someone help care for you until you are awake and alert. If you have sleep apnea, surgery and some medicines can increase your risk for breathing problems. Follow instructions from your health care provider about wearing your sleep device: When you are sleeping. This includes during daytime naps. While taking prescription pain medicines, sleeping medicines, or medicines that make you drowsy. Do not use any products that contain nicotine or tobacco. These products include cigarettes, chewing tobacco, and vaping devices, such as e-cigarettes. If you need help quitting, ask your health care provider. Contact a health care provider if: You feel nauseous or vomit every time you eat or drink. You feel light-headed. You are still sleepy or having trouble with balance after 24 hours. You get a rash. You have a fever. You have redness or swelling around the IV site. Get help right away if: You have trouble breathing. You have new confusion after you get home. These symptoms may be an emergency. Get help right away. Call 911. Do not wait to see if the symptoms will go away. Do not drive yourself to the hospital. This information is not intended to replace advice given to you by your health care provider. Make sure you discuss any questions you have with your health care provider. Document Revised: 01/25/2022 Document Reviewed: 01/25/2022 Elsevier Patient Education  2023 [employer].

## 2022-08-10 ENCOUNTER — Encounter (HOSPITAL_COMMUNITY)
Admission: RE | Admit: 2022-08-10 | Discharge: 2022-08-10 | Disposition: A | Discharge status: Home / Self Care | Payer: Medicare Other | Source: Ambulatory Visit | Attending: Internal Medicine | Admitting: Internal Medicine

## 2022-08-10 ENCOUNTER — Encounter (HOSPITAL_COMMUNITY): Payer: Self-pay

## 2022-08-10 ENCOUNTER — Encounter (HOSPITAL_COMMUNITY): Payer: Self-pay | Admitting: Anesthesiology

## 2022-08-10 VITALS — Ht 72.0 in | Wt 219.0 lb

## 2022-08-10 DIAGNOSIS — Z01818 Encounter for other preprocedural examination: Secondary | ICD-10-CM | POA: Insufficient documentation

## 2022-08-10 DIAGNOSIS — E1140 Type 2 diabetes mellitus with diabetic neuropathy, unspecified: Secondary | ICD-10-CM | POA: Diagnosis not present

## 2022-08-10 DIAGNOSIS — N1832 Chronic kidney disease, stage 3b: Secondary | ICD-10-CM | POA: Diagnosis not present

## 2022-08-10 DIAGNOSIS — Z794 Long term (current) use of insulin: Secondary | ICD-10-CM | POA: Diagnosis not present

## 2022-08-10 HISTORY — DX: Dyspnea, unspecified: R06.00

## 2022-08-10 LAB — BASIC METABOLIC PANEL
Anion gap: 6 (ref 5–15)
BUN: 35 mg/dL — ABNORMAL HIGH (ref 8–23)
CO2: 24 mmol/L (ref 22–32)
Calcium: 9.2 mg/dL (ref 8.9–10.3)
Chloride: 104 mmol/L (ref 98–111)
Creatinine, Ser: 1.55 mg/dL — ABNORMAL HIGH (ref 0.61–1.24)
GFR, Estimated: 46 mL/min — ABNORMAL LOW (ref >60)
Glucose, Bld: 153 mg/dL — ABNORMAL HIGH (ref 70–99)
Potassium: 4.4 mmol/L (ref 3.5–5.1)
Sodium: 134 mmol/L — ABNORMAL LOW (ref 135–145)

## 2022-08-11 ENCOUNTER — Encounter (HOSPITAL_COMMUNITY)
Admission: RE | Disposition: A | Discharge status: Home / Self Care | Payer: Self-pay | Source: Home / Self Care | Attending: Internal Medicine

## 2022-08-11 ENCOUNTER — Telehealth: Payer: Self-pay | Admitting: *Deleted

## 2022-08-11 ENCOUNTER — Ambulatory Visit (HOSPITAL_COMMUNITY)
Admission: RE | Admit: 2022-08-11 | Discharge: 2022-08-11 | Disposition: A | Discharge status: Home / Self Care | Payer: Medicare Other | Attending: Internal Medicine | Admitting: Internal Medicine

## 2022-08-11 DIAGNOSIS — I4891 Unspecified atrial fibrillation: Secondary | ICD-10-CM

## 2022-08-11 SURGERY — CARDIOVERSION
Anesthesia: Monitor Anesthesia Care

## 2022-08-11 MED ORDER — CHLORHEXIDINE GLUCONATE 0.12 % MT SOLN: 15.0000 mL | Freq: Once | OROMUCOSAL | Status: DC

## 2022-08-11 MED ORDER — LACTATED RINGERS IV SOLN: INTRAVENOUS | Status: DC

## 2022-08-11 MED ORDER — SODIUM CHLORIDE 0.9 % IV SOLN: INTRAVENOUS | Status: DC

## 2022-08-11 MED ORDER — ORAL CARE MOUTH RINSE: 15.0000 mL | Freq: Once | OROMUCOSAL | Status: DC

## 2022-08-11 NOTE — Telephone Encounter (Signed)
Patient informed and verbalized understanding of plan. 

## 2022-08-11 NOTE — Telephone Encounter (Signed)
-----   Message from Marjo Bicker, MD sent at 08/11/2022  9:33 AM EST ----- Regarding: Schedule [facility] and obtain 2D Echo I canceled his TEE guided DCCV procedure today.  Please schedule him to be seen by APP or me in [facility] heart care clinic next week.  His insurance will benefit from August 13, 2022.  Please also obtain 2D echocardiogram.  Thank you.

## 2022-08-11 NOTE — Progress Notes (Addendum)
    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 He stated he stopped taking all his medications approximately a week or 2 weeks ago following which he felt significantly better. Today he is in A-fib and his heart rate was ranging from 90-100s at rest.  His insurance was also changed recently and will be in complete effect from August 13, 2022. I will cancel his TEE guided DCCV procedure today and schedule him in [facility] clinic at [facility] next week with me or APP to start low-dose of metoprolol and Xarelto.  I will obtain 2D echocardiogram for LVEF assessment. Patient verbalized understanding of the above plan. In addition, he said he cannot read or write and also does not prefer to come to [facility] for any procedure unless absolutely needed. He will need case management referral who can reach out to the insurance company to send prescriptions in pillboxes. Nurse in preop area to call case management if they can see him before he leaves.  Todd Fiser Verne Spurr, MD [place]  [facility]  9:30 AM

## 2022-08-17 ENCOUNTER — Ambulatory Visit (HOSPITAL_COMMUNITY): Payer: Medicare Other | Attending: Nurse Practitioner | Admitting: Nurse Practitioner

## 2022-08-17 ENCOUNTER — Encounter: Payer: Self-pay | Admitting: Nurse Practitioner

## 2022-08-17 VITALS — BP 125/80 | HR 83 | Ht 72.0 in | Wt 222.8 lb

## 2022-08-17 DIAGNOSIS — Z79899 Other long term (current) drug therapy: Secondary | ICD-10-CM | POA: Diagnosis not present

## 2022-08-17 DIAGNOSIS — R296 Repeated falls: Secondary | ICD-10-CM

## 2022-08-17 DIAGNOSIS — I739 Peripheral vascular disease, unspecified: Secondary | ICD-10-CM

## 2022-08-17 DIAGNOSIS — Z91148 Patient's other noncompliance with medication regimen for other reason: Secondary | ICD-10-CM

## 2022-08-17 DIAGNOSIS — I4891 Unspecified atrial fibrillation: Secondary | ICD-10-CM | POA: Diagnosis not present

## 2022-08-17 DIAGNOSIS — Z794 Long term (current) use of insulin: Secondary | ICD-10-CM

## 2022-08-17 DIAGNOSIS — I10 Essential (primary) hypertension: Secondary | ICD-10-CM | POA: Diagnosis not present

## 2022-08-17 DIAGNOSIS — Z550 Illiteracy and low-level literacy: Secondary | ICD-10-CM

## 2022-08-17 DIAGNOSIS — E785 Hyperlipidemia, unspecified: Secondary | ICD-10-CM

## 2022-08-17 DIAGNOSIS — E1140 Type 2 diabetes mellitus with diabetic neuropathy, unspecified: Secondary | ICD-10-CM

## 2022-08-17 NOTE — Progress Notes (Addendum)
Cardiology Office Note:    Date:  08/17/22  ID:  Todd Zhang, DOB [birth year], MRN [id number]  PCP:  [name], [place] [facility] Cardiologist:  Chalmers Guest, MD     Referring MD: [name], Todd Zhang, *   CC: Here for follow-up  History of Present Illness:    Todd Zhang is a 76 y.o. male with a hx of the following:  Paroxysmal A-fib PAD Hyperlipidemia Hypertension Type 2 diabetes  Previously was a patient of Dr. Almira Coaster of [facility] health cardiology.  Past medical cardiovascular history includes paroxysmal A-fib and underwent cardioversion in 2016.  History of PAD manifested by CLI and had ulceration and is status post left CFA endarterectomy, L below-knee popliteal with TP endarterectomy, left CFA-TP trunk bypass, and left great toe amputation in 2022.  Follows vascular surgery.  Was referred to cardiology services for evaluation of A-fib at the request of his PCP.  Establish care with Dr. Dellia Cloud on July 28, 2022.  At office visit that day he noted palpitations for nearly 1 year, had associated symptom of worsening dyspnea on exertion.  EKG in office that day revealed A-fib with RVR, heart rate 111.  Noted dull chest discomfort, lasting an hour or so, random in occurrence and resolving spontaneously, typically occurring about once per month.  Denied any family history of cardiovascular disease, and also denied any other cardiac complaints or concerns.  Blood pressure was soft in office that day, Dr. Dellia Cloud recommended to go to ER for evaluation; however patient refused at this time.  Was started on metoprolol tartrate 25 mg twice daily and Xarelto 15 mg daily was continued.  Lasix was increased from 20 mg daily to 40 mg twice daily for dyspnea on exertion.  Was scheduled for TEE with cardioversion in 2 weeks.  Also started on Zetia 10 mg daily for his history of hyperlipidemia.  Benazepril was switched to lisinopril 2.5 mg  once daily.   While presenting for elective TEE with guided cardioversion, he stated he stopped taking all of his medicines around a week to 2 weeks prior and stated he felt better.  It was found he was in A-fib heart rate ranging from 90-100s.  His insurance had changed at that time it would not be in complete effect until the next day, therefore his TEE guided with cardioversion was canceled and scheduled for outpatient follow-up visit the following week to start low-dose metoprolol and Xarelto.  He noted at that time that he cannot read or write and prefer not to come to [facility] for any procedures unless absolutely necessary.  Dr. Dellia Cloud stated he will need a case management referral to reach out to insurance company to send prescriptions in pillboxes.  Echocardiogram scheduled for a few weeks after.  Today he presents for follow-up evaluation for A-fib. He is here alone. Pt is a difficult historian. He says overall he is doing well and says he feels better after stopping all of his medications. He is currently not taking any medications at this time. He has brought in his pill packs and pill bottles, but says he can't read.  He is only able to identify pills via color, texture, and size.  He does not monitor his blood pressure at home and says he thinks he owns a cuff, but does not know how to use this.  Denies any chest pain, shortness of breath, syncope, presyncope, dizziness, orthopnea, PND, swelling, significant weight changes, or claudication. Does admit  to what sounds like occasional palpitations, but denies chest pain. Denies any stroke-like symptoms. Does have hx of frequent falls, says his knees lock up and has broken fingers from landing on his hands when he falls, denies any acute injuries. Denies any other questions or concerns today.   Past Medical History:  Diagnosis Date   Atrial fibrillation ([name])    Cervical spinal stenosis    DDD (degenerative disc disease), cervical    DDD  (degenerative disc disease), lumbar    Diabetes mellitus without complication ([name])    Dyspnea    Hyperlipidemia    Hypertension    Peripheral artery disease ([name])     Past Surgical History:  Procedure Laterality Date   ABDOMINAL AORTOGRAM W/LOWER EXTREMITY N/A 04/30/2021   Procedure: ABDOMINAL AORTOGRAM W/LOWER EXTREMITY;  Surgeon: Cherre Robins, MD;  Location: [facility];  Service: Cardiovascular;  Laterality: N/A;   AMPUTATION TOE     AMPUTATION TOE Left 05/04/2021   Procedure: LEFT GREAT TOE AMPUTATION;  Surgeon: Marty Heck, MD;  Location: [facility] [place];  Service: Vascular;  Laterality: Left;   BYPASS GRAFT FEMORAL-PERONEAL Left 05/04/2021   Procedure: LEFT COMMON FEMORAL-TIBIOPERONEAL TRUNK BYPASS;  Surgeon: Marty Heck, MD;  Location: [place];  Service: Vascular;  Laterality: Left;   ENDARTERECTOMY FEMORAL Left 05/04/2021   Procedure: LEFT COMMON FEMORAL ENDARTERECTOMY WITH BOVINE PATCH;  Surgeon: Marty Heck, MD;  Location: [place];  Service: Vascular;  Laterality: Left;   ENDARTERECTOMY TIBIOPERONEAL Left 05/04/2021   Procedure: LEFT BELOW KNEE POPITEAL WITH  TIBIOPERONEAL ENDARTERECTOMY;  Surgeon: Marty Heck, MD;  Location: [place];  Service: Vascular;  Laterality: Left;   ENDOVEIN HARVEST OF GREATER SAPHENOUS VEIN  05/04/2021   Procedure: HARVEST OF LEFT GREATER SAPHENOUS VEIN;  Surgeon: Marty Heck, MD;  Location: [place];  Service: Vascular;;   INSERTION OF ILIAC STENT Left 05/04/2021   Procedure: LEFT COMMON ILLIAC ATERY ANGIOPLASTY WITH STENT;  Surgeon: Marty Heck, MD;  Location: [place];  Service: Vascular;  Laterality: Left;   INTRAOPERATIVE ARTERIOGRAM Left 05/04/2021   Procedure: LEFT ARTERY ARTERIOGRAM;  Surgeon: Marty Heck, MD;  Location: [place];  Service: Vascular;  Laterality: Left;   IRRIGATION AND DEBRIDEMENT FOOT Left 05/04/2021   Procedure: IRRIGATION AND DEBRIDEMENT FOOT;  Surgeon: Marty Heck, MD;   Location: [place] [place];  Service: Vascular;  Laterality: Left;   KNEE SURGERY Left    NOSE SURGERY     TEE WITHOUT CARDIOVERSION N/A 05/01/2021   Procedure: TRANSESOPHAGEAL ECHOCARDIOGRAM (TEE);  Surgeon: Skeet Latch, MD;  Location: [facility];  Service: Cardiovascular;  Laterality: N/A;    Current Medications: Not taking any medications  Allergies:   Asa [aspirin], Penicillins, and Tetanus toxoid   Social History   Socioeconomic History   Marital status: Divorced    Spouse name: Not on file   Number of children: 2   Years of education: 11   Highest education level: 11th grade  Occupational History   Occupation: Disabled  Tobacco Use   Smoking status: Former    Packs/day: 1.00    Types: Cigarettes    Start date: 06/19/1966    Quit date: 06/20/2011    Years since quitting: 11.1    Passive exposure: Never   Smokeless tobacco: Never  Vaping Use   Vaping Use: Never used  Substance and Sexual Activity   Alcohol use: No    Comment: quit drinking in 1982   Drug use: Not Currently  Comment: marijuana and cocaine in the past, quit in 2012   Sexual activity: Not Currently  Other Topics Concern   Not on file  Social History Narrative   Lives alone - ground level apartment   Social Determinants of Health   Financial Resource Strain: Low Risk  (11/30/2021)   Overall Financial Resource Strain (CARDIA)    Difficulty of Paying Living Expenses: Not very hard  Food Insecurity: Food Insecurity Present (12/29/2021)   Hunger Vital Sign    Worried About Running Out of Food in the Last Year: Sometimes true    Ran Out of Food in the Last Year: Sometimes true  Transportation Needs: No Transportation Needs (11/30/2021)   PRAPARE - Hydrologist (Medical): No    Lack of Transportation (Non-Medical): No  Physical Activity: Inactive (03/02/2022)   Exercise Vital Sign    Days of Exercise per Week: 0 days    Minutes of Exercise per Session: 0 min  Stress: Stress  Concern Present (04/12/2022)   [place]    Feeling of Stress : To some extent  Social Connections: Moderately Integrated (11/30/2021)   Social Connection and Isolation Panel [NHANES]    Frequency of Communication with Friends and Family: Twice a week    Frequency of Social Gatherings with Friends and Family: Twice a week    Attends Religious Services: More than 4 times per year    Active Member of [employer] or Organizations: Yes    Attends Archivist Meetings: More than 4 times per year    Marital Status: Widowed     Family History: The patient's family history includes Cancer in his father; Heart disease in his mother; Stroke in his mother.  ROS:   Review of Systems  Constitutional: Negative.   HENT: Negative.    Eyes: Negative.   Cardiovascular: Negative.   Gastrointestinal: Negative.   Genitourinary: Negative.   Musculoskeletal:  Positive for falls. Negative for back pain, joint pain, myalgias and neck pain.  Skin: Negative.   Neurological:  Positive for weakness. Negative for dizziness, tingling, tremors, sensory change, speech change, focal weakness, seizures, loss of consciousness and headaches.  Endo/Heme/Allergies: Negative.   Psychiatric/Behavioral: Negative.      Please see the history of present illness.    All other systems reviewed and are negative.  EKGs/Labs/Other Studies Reviewed:    The following studies were reviewed today:   EKG:  EKG is ordered today.  The ekg ordered today demonstrates A-fib, 97 bpm, IVC delay, otherwise nothing acute.   TEE on 05/01/2021:  1. Left ventricular ejection fraction, by estimation, is 60 to 65%. The  left ventricle has normal function. The left ventricle has no regional  wall motion abnormalities.   2. Right ventricular systolic function is normal. The right ventricular  size is normal.   3. No left atrial/left atrial appendage thrombus was detected.    4. The mitral valve is normal in structure. Mild mitral valve  regurgitation. No evidence of mitral stenosis.   5. 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 Functionally  bicuspid. The aortic valve is calcified. There is moderate calcification  of the aortic valve. There is moderate thickening of the aortic valve.  Aortic valve regurgitation is not  visualized. Mild to moderate aortic valve sclerosis/calcification is  present, without any evidence of aortic stenosis. Aortic valve mean  gradient measures 8.0 mmHg. Aortic valve Vmax measures 1.96 m/s.   6.  There is Moderate (Grade III) layered plaque involving the descending  aorta.   7. The inferior vena cava is normal in size with greater than 50%  respiratory variability, suggesting right atrial pressure of 3 mmHg.   Conclusion(s)/Recommendation(s): No evidence of vegetation/infective  endocarditis on this transesophageal  echocardiogram.  2D Echo complete on 04/26/2021: 1. Left ventricular ejection fraction, by estimation, is 60 to 65%. The  left ventricle has normal function. The left ventricle has no regional  wall motion abnormalities. Left ventricular diastolic function could not  be evaluated.   2. Right ventricular systolic function is normal. The right ventricular  size is normal.   3. The mitral valve is normal in structure. No evidence of mitral valve  regurgitation. No evidence of mitral stenosis.   4. The aortic valve has an indeterminant number of cusps. Aortic valve  regurgitation is not visualized. Mild aortic valve stenosis.   5. The inferior vena cava is dilated in size with >50% respiratory  variability, suggesting right atrial pressure of 8 mmHg.  Recent Labs: 10/30/2021: TSH 2.310 08/03/2022: ALT 18; Hemoglobin 13.6; Platelets 317 08/10/2022: BUN 35; Creatinine, Ser 1.55; Potassium 4.4; Sodium 134  Recent Lipid Panel    Component Value Date/Time   CHOL 218 (H) 08/03/2022 1030   TRIG 204 (H)  08/03/2022 1030   HDL 44 08/03/2022 1030   CHOLHDL 5.0 08/03/2022 1030   CHOLHDL 5.4 05/04/2021 2353   VLDL 34 05/04/2021 2353   LDLCALC 137 (H) 08/03/2022 1030     Risk Assessment/Calculations:    CHA2DS2-VASc Score = 5  This indicates a 7.2% annual risk of stroke. The patient's score is based upon: CHF History: 0 HTN History: 1 Diabetes History: 1 Stroke History: 0 Vascular Disease History: 1 Age Score: 2 Gender Score: 0   Physical Exam:    VS:  BP 125/80 (BP Location: Left Arm, Patient Position: Sitting, Cuff Size: Normal)   Pulse 83   Ht 6' (1.829 m)   Wt 222 lb 12.8 oz (101.1 kg)   SpO2 94%   BMI 30.22 kg/m     Wt Readings from Last 3 Encounters:  08/17/22 222 lb 12.8 oz (101.1 kg)  08/10/22 219 lb (99.3 kg)  08/03/22 219 lb (99.3 kg)     GEN: Obese, 76 y.o. male in no acute distress HEENT: Normal NECK: No JVD; No carotid bruits CARDIAC: S1/S2, irregular rhythm and regular rate, no murmurs, rubs, gallops; 2+ peripheral pulses throughout, strong and equal bilaterally RESPIRATORY:  Clear and diminished to auscultation without rales, wheezing or rhonchi  MUSCULOSKELETAL:  No edema; some broken fingers, otherwise no deformity  SKIN: Warm and dry NEUROLOGIC:  Alert and oriented x 3 PSYCHIATRIC:  Normal affect, distracted  ASSESSMENT:    1. Atrial fibrillation, unspecified type (HCC)   2. H/O medication noncompliance   3. Medication management   4. Essential hypertension   5. PAD (peripheral artery disease) (HCC)   6. Hyperlipidemia, unspecified hyperlipidemia type   7. Type 2 diabetes mellitus with diabetic neuropathy, with long-term current use of insulin (HCC)   8. Frequent falls   9. Illiteracy    PLAN:    In order of problems listed above:  A-fib, medication non-compliance, medication management Does admit to what seems like palpitations, but pt is a difficult historian. EKG today reveals A-fib, HR in upper 90's to low 100s. Has stopped all his  medications. Cousulted patient's cardiologist, Dr. Jenene Slicker and DOD Dr. Diona Browner regarding patient's case. Will restart Xarelto 15 mg  daily for CrCl of 49 mL/min and Plavix. Pt cannot tolerate ASA (has documented allergy). Will restart Lopressor 25 mg BID, Xarelto 15 mg daily, and Plavix 75 mg daily. Pt will need new pill packs for this and will refer pt for assistance with [facility] Care Management due to health illiteracy (see below). Will obtain CBC and BMET prior to next OV. Heart healthy diet and regular cardiovascular exercise as tolerated encouraged.   HTN BP on arrival 158/70. Repeat BP 125/80. Does not check BP at home, says he thinks he has a BP cuff but not sure where it is, and says he does not know how to use it and will need help. Does not have anyone nearby to assist him. Discussed with him to bring his cuff to next office visit and will show him how or even schedule a nurse visit in the future to assist him. Placing a referral for [facility] Care Management as mentioned above. Re-initiate Lopressor 25 mg BID and Lasix 40 mg BID. Recommended he goes to local pharmacy to get his pill packets arranged, reflecting these changes. Will obtain CBC and BMET prior to next OV. If BP stable at next OV, re-initiate Lisinopril 2.5 mg daily. Heart healthy diet and regular cardiovascular exercise as tolerated encouraged.   PAD Denies any claudication symptoms. Will re-initiate Plavix (cannot tolerate ASA), in addition to Xarelto as mentioned above.  Continue to follow-up with [facility] and establish care with PCP. Will re-initiate Lipitor 40 mg daily, Zetia 10 mg daily, and Tricor 145 mg daily. Will route note to [facility] in regards to Plavix use as this was not mentioned at last OV dated 08/11/2021. Heart healthy diet and regular cardiovascular exercise as tolerated encouraged.   Addendum August 21, 2022: Dr. Carlis Abbott (patient's vascular surgeon) recommended to continue Plavix.   HLD Most recent labs from November 2023  revealed total cholesterol 218, HDL 44, LDL 137, and triglycerides 204.  He is currently not at goal most likely due to medication noncompliance.  Will reinitiate Lipitor, Zetia, and Tricor as mentioned above. Heart healthy diet and regular cardiovascular exercise as tolerated encouraged.   T2DM Hemoglobin A1c in November 2023 was 8.5%.  Recommended he establish care with PCP. Heart healthy, diabetic diet and regular cardiovascular exercise encouraged.  PCP to manage.  Hx of frequent falls  Stated he has fallen 3-4 times in the month of November, not related to syncope or dizziness but related to the fact that his knees buckle.  Typically lands on his hands, has some broken fingers. Recommended he sees a PCP. If does not see a PCP by the time of next OV, will refer him to one as well as consider hand specialist. Discussed case with Dr. Dellia Cloud and discussed my concerns about re-starting Xarelto; however, etiology of falls may be d/t possible low BP, she recommended to initiate Xarelto at 15 mg daily and reinitiate Plavix 75 mg daily. I offered to refer him to physical therapy to reduce his falls; however, he politely declined.   Illiteracy Cannot read or write. Does not have anyone nearby to help assist with his medications. Referring to [facility] Management as mentioned above and will also loop in LCSW with this case to see if there is any other assistance to provide.   Disposition: Follow-up with me or Dr. Dellia Cloud in 2 weeks, and will route note to LCSW for assistance regarding patient's case.   I spent 60 minutes with patient and on case, consulting with patient's cardiologist, Dr.  Mallipeddi and DOD, Dr. Domenic Polite.   Medication Adjustments/Labs and Tests Ordered: Current medicines are reviewed at length with the patient today.  Concerns regarding medicines are outlined above.  Orders Placed This Encounter  Procedures   CBC   Basic metabolic panel   AMB Referral to [facility] Management    EKG 12-Lead   No orders of the defined types were placed in this encounter.   Patient Instructions  Medication Instructions:  Restart your Metoprolol (Lopressor) 25mg  twice a day   Restart your Xarelto 15mg  daily  Restart your Plavix 75mg  daily Restart your Lipitor 40mg  daily Restart your Zetia 10mg  daily  Restart your Tricor 145mg  daily Restart your Lasix 40mg  twice a day    Labwork: CBC, BMET - orders given today  Please do just prior to next visit.   Testing/Procedures: none  Follow-Up: 2-3 weeks  Any Other Special Instructions Will Be Listed Below (If Applicable). You have been referred to [facility] to assist with medication management    If you need a refill on your cardiac medications before your next appointment, please call your pharmacy.    SignedFinis Bud, NP  08/18/2022 11:12 AM    [place]

## 2022-08-17 NOTE — Patient Instructions (Addendum)
Medication Instructions:  Restart your Metoprolol (Lopressor) 25mg  twice a day   Restart your Xarelto 15mg  daily  Restart your Plavix 75mg  daily Restart your Lipitor 40mg  daily Restart your Zetia 10mg  daily  Restart your Tricor 145mg  daily Restart your Lasix 40mg  twice a day    Labwork: CBC, BMET - orders given today  Please do just prior to next visit.   Testing/Procedures: none  Follow-Up: 2-3 weeks  Any Other Special Instructions Will Be Listed Below (If Applicable). You have been referred to [facility] to assist with medication management    If you need a refill on your cardiac medications before your next appointment, please call your pharmacy.

## 2022-08-18 ENCOUNTER — Telehealth: Payer: Self-pay

## 2022-08-18 NOTE — Progress Notes (Signed)
  Chronic Care Management   Note  08/18/2022 Name: Todd Zhang MRN: [id number] DOB: 09/14/1945  Todd Zhang is a 76 y.o. year old male who is a primary care patient of [name], FNP. I reached out to [name] by phone today in response to a referral sent by Todd Zhang PCP.  Todd Zhang was not successfully contacted today. A HIPAA compliant voice message was left requesting a return call.   Follow up plan: Additional outreach attempts will be made.  Penne Lash, RMA Care Guide [facility]  [place], [place] [address] Direct Dial: [phone] [email or web address]

## 2022-08-25 ENCOUNTER — Ambulatory Visit: Payer: Medicare Other | Attending: Internal Medicine

## 2022-08-25 DIAGNOSIS — I4891 Unspecified atrial fibrillation: Secondary | ICD-10-CM | POA: Diagnosis not present

## 2022-08-25 LAB — ECHOCARDIOGRAM COMPLETE
AR max vel: 1.97 cm2
AV Area VTI: 1.92 cm2
AV Area mean vel: 1.9 cm2
AV Mean grad: 10.5 mmHg
AV Peak grad: 17.5 mmHg
Ao pk vel: 2.09 m/s
Area-P 1/2: 3.69 cm2
Calc EF: 59.1 %
MV M vel: 2.64 m/s
MV Peak grad: 27.9 mmHg
S' Lateral: 2.8 cm
Single Plane A2C EF: 60.9 %
Single Plane A4C EF: 56.8 %

## 2022-08-25 MED ORDER — PERFLUTREN LIPID MICROSPHERE
1.0000 mL | INTRAVENOUS | Status: AC | PRN
  Administered 2022-08-25: 2 mL via INTRAVENOUS

## 2022-08-27 ENCOUNTER — Ambulatory Visit (INDEPENDENT_AMBULATORY_CARE_PROVIDER_SITE_OTHER): Payer: Medicare Other | Admitting: Internal Medicine

## 2022-08-27 VITALS — BP 138/76 | HR 80 | Ht 72.0 in | Wt 215.6 lb

## 2022-08-27 DIAGNOSIS — I4891 Unspecified atrial fibrillation: Secondary | ICD-10-CM

## 2022-08-27 MED ORDER — METOPROLOL TARTRATE 50 MG PO TABS: 50.0000 mg | ORAL_TABLET | Freq: Two times a day (BID) | ORAL | 1 refills | Status: DC

## 2022-08-27 NOTE — Telephone Encounter (Signed)
Transition Care Management Unsuccessful Follow-up Telephone Call  Date of discharge and from where:  [facility] 08/26/2022  Attempts:  1st Attempt  Reason for unsuccessful TCM follow-up call:  Unable to reach patient

## 2022-08-27 NOTE — Patient Instructions (Addendum)
Medication Instructions:  Your physician has recommended you make the following change in your medication:  Stop xarelto  Increase lopressor to 50 mg twice daily Continue other medications the same [facility] aware that pill packs will be returned for repackaging  Labwork: none  Testing/Procedures: none  Follow-Up: Your physician recommends that you schedule a follow-up appointment in: 2 weeks with Lanora Manis Your physician recommends that you schedule a follow-up appointment in: 1 month with Dr. Jenene Slicker  Any Other Special Instructions Will Be Listed Below (If Applicable).  If you need a refill on your cardiac medications before your next appointment, please call your pharmacy.

## 2022-08-27 NOTE — Progress Notes (Signed)
Cardiology Office Note  Date: 08/27/2022   ID: Todd Zhang, [name] [birth year], MRN [id number]  PCP:  [name], FNP  Cardiologist:  Marjo Bicker, MD Electrophysiologist:  None   Reason for Office Visit: Evaluation of A-fib at the request of Todd Deutscher, FNP  History of Present Illness: Todd Zhang is a 76 y.o. male known to have paroxysmal A-fib status post DCCV in 2016, PAD manifested by CLI with ulceration s/p L CFA endarterectomy, L CFA-TP trunk bypass, L CIA stent, L below-knee popliteal with TP endarterectomy and L great toe amputation in 2022, HTN, HLD, DM2 presented to cardiology clinic for follow-up visit.  Patient was initially scheduled for TEE guided DCCV in 07/2022 but patient stopped taking his Xarelto and metoprolol due to which the procedure had to be canceled. He was scheduled to be seen in the APP clinic in 08/2022 after which metoprolol and Xarelto were restarted.  He presented today for follow-up visit.  He had ER visit to [facility] on 08/26/2022 and was prescribed ciprofloxacin and diverticulitis for colitis infection.  He continues to have DOE but not as bad as before, denied any angina, syncope. He continues to have falls, 3 falls in the last 1 month and he even hit his head. He started to have falls few months ago due to neuropathy in his legs. He stopped taking gabapentin a few months ago as someone at the [facility] rec pharmacy told him that gabapentin could cause as much dementia.  Patient is also being forgetful, stated that he sometimes forgets which destination to go after he got into the car, forgets what to pick up in the grocery store after he is in the grocery store and even forgets conversations he has with her provider/physician after the appointment.  He is not married and has no kids.  He has no family support and told me his sister/brother have their own health problems to deal with.  Past Medical History:  Diagnosis Date   Atrial fibrillation (HCC)     Cervical spinal stenosis    DDD (degenerative disc disease), cervical    DDD (degenerative disc disease), lumbar    Diabetes mellitus without complication (HCC)    Dyspnea    Hyperlipidemia    Hypertension    Peripheral artery disease (HCC)     Past Surgical History:  Procedure Laterality Date   ABDOMINAL AORTOGRAM W/LOWER EXTREMITY N/A 04/30/2021   Procedure: ABDOMINAL AORTOGRAM W/LOWER EXTREMITY;  Surgeon: Leonie Douglas, MD;  Location: [facility] INVASIVE [facility];  Service: Cardiovascular;  Laterality: N/A;   AMPUTATION TOE     AMPUTATION TOE Left 05/04/2021   Procedure: LEFT GREAT TOE AMPUTATION;  Surgeon: [name], MD;  Location: [facility] OR;  Service: Vascular;  Laterality: Left;   BYPASS GRAFT FEMORAL-PERONEAL Left 05/04/2021   Procedure: LEFT COMMON FEMORAL-TIBIOPERONEAL TRUNK BYPASS;  Surgeon: [name], MD;  Location: [facility] OR;  Service: Vascular;  Laterality: Left;   ENDARTERECTOMY FEMORAL Left 05/04/2021   Procedure: LEFT COMMON FEMORAL ENDARTERECTOMY WITH BOVINE PATCH;  Surgeon: [name], MD;  Location: [facility] OR;  Service: Vascular;  Laterality: Left;   ENDARTERECTOMY TIBIOPERONEAL Left 05/04/2021   Procedure: LEFT BELOW KNEE POPITEAL WITH  TIBIOPERONEAL ENDARTERECTOMY;  Surgeon: [name], MD;  Location: [facility] OR;  Service: Vascular;  Laterality: Left;   ENDOVEIN HARVEST OF GREATER SAPHENOUS VEIN  05/04/2021   Procedure: HARVEST OF LEFT GREATER SAPHENOUS VEIN;  Surgeon: [name], MD;  Location: [facility] OR;  Service: Vascular;;   INSERTION OF ILIAC STENT Left 05/04/2021   Procedure: LEFT COMMON ILLIAC ATERY ANGIOPLASTY WITH STENT;  Surgeon: Marty Heck, MD;  Location: [place];  Service: Vascular;  Laterality: Left;   INTRAOPERATIVE ARTERIOGRAM Left 05/04/2021   Procedure: LEFT ARTERY ARTERIOGRAM;  Surgeon: Marty Heck, MD;  Location: [place];  Service: Vascular;  Laterality: Left;   IRRIGATION AND DEBRIDEMENT FOOT Left 05/04/2021    Procedure: IRRIGATION AND DEBRIDEMENT FOOT;  Surgeon: Marty Heck, MD;  Location: [place];  Service: Vascular;  Laterality: Left;   KNEE SURGERY Left    NOSE SURGERY     TEE WITHOUT CARDIOVERSION N/A 05/01/2021   Procedure: TRANSESOPHAGEAL ECHOCARDIOGRAM (TEE);  Surgeon: Skeet Latch, MD;  Location: [facility] ENDOSCOPY;  Service: Cardiovascular;  Laterality: N/A;    Current Outpatient Medications  Medication Sig Dispense Refill   alendronate (FOSAMAX) 70 MG tablet TAKE 1 TABLET BY MOUTH ONCE WEEKLY ON SUNDAY ON ON AN EMPTY STOMACH with full glass of water 12 tablet 0   atorvastatin (LIPITOR) 40 MG tablet Take 1 tablet (40 mg total) by mouth daily. 90 tablet 1   cetirizine (ZYRTEC) 10 MG tablet Take 10 mg by mouth daily.     ciprofloxacin (CIPRO) 500 MG tablet Take by mouth.     clopidogrel (PLAVIX) 75 MG tablet Take 1 tablet (75 mg total) by mouth daily. 90 tablet 1   collagenase (SANTYL) ointment Apply 1 application topically daily. 30 g 1   ezetimibe (ZETIA) 10 MG tablet Take 1 tablet (10 mg total) by mouth daily. 90 tablet 3   fenofibrate (TRICOR) 145 MG tablet Take 1 tablet (145 mg total) by mouth daily. 90 tablet 1   furosemide (LASIX) 40 MG tablet Take 1 tablet (40 mg total) by mouth 2 (two) times daily. 60 tablet 3   gabapentin (NEURONTIN) 300 MG capsule Take 1 capsule (300 mg total) by mouth 4 (four) times daily. TAKE 1 CAPSULE BY MOUTH THREE TIMES DAILY FOR PAIN 120 capsule 5   glucose blood (ACCU-CHEK AVIVA PLUS) test strip CHECK BLOOD SUGAR FOUR TIMES DAILY Dx E11.40 400 strip 3   insulin regular (HUMULIN R) 100 units/mL injection INJECT 10 UNITS INTO THE SKIN THREE TIMES DAILY BEFORE MEALS 30 mL 1   Insulin Syringe-Needle U-100 (GLOBAL INJECT EASE INSULIN SYR) 31G X 5/16" 0.3 ML MISC USE WITH INSULIN EVERY DAY Dx E11.40 100 each 3   lisinopril (ZESTRIL) 2.5 MG tablet Take 1 tablet (2.5 mg total) by mouth daily. 30 tablet 3   metFORMIN (GLUCOPHAGE) 500 MG tablet Take 1 tablet  (500 mg total) by mouth 2 (two) times daily. 180 tablet 0   metoprolol tartrate (LOPRESSOR) 25 MG tablet Take 1 tablet (25 mg total) by mouth 2 (two) times daily. 60 tablet 3   metroNIDAZOLE (FLAGYL) 500 MG tablet Take by mouth.     Multiple Vitamin (MULTIVITAMIN) tablet Take 1 tablet by mouth daily.     mupirocin ointment (BACTROBAN) 2 % Apply 1 application  topically daily. Left Great Toe     nitroGLYCERIN (NITROSTAT) 0.4 MG SL tablet Place 1 tablet (0.4 mg total) under the tongue every 5 (five) minutes x 3 doses as needed for chest pain (if no relief after 3rd dose, proceed to ED or call 911). 25 tablet 2   pantoprazole (PROTONIX) 40 MG tablet Take 1 tablet (40 mg total) by mouth daily. 90 tablet 1   Rivaroxaban (XARELTO) 15 MG TABS tablet Take 1 tablet (15 mg  total) by mouth daily with supper. 90 tablet 1   tamsulosin (FLOMAX) 0.4 MG CAPS capsule Take 1 capsule (0.4 mg total) by mouth daily. 90 capsule 1   triamcinolone cream (KENALOG) 0.5 %      vitamin B-12 (CYANOCOBALAMIN) 500 MCG tablet Take 500 mcg by mouth daily.     No current facility-administered medications for this visit.   Allergies:  Asa [aspirin], Penicillins, and Tetanus toxoid   Social History: The patient  reports that he quit smoking about 11 years ago. His smoking use included cigarettes. He started smoking about 56 years ago. He smoked an average of 1 pack per day. He has never been exposed to tobacco smoke. He has never used smokeless tobacco. He reports that he does not currently use drugs. He reports that he does not drink alcohol.   Family History: The patient's family history includes Cancer in his father; Heart disease in his mother; Stroke in his mother.   ROS:  Please see the history of present illness. Otherwise, complete review of systems is positive for none.  All other systems are reviewed and negative.   Physical Exam: VS:  Ht 6' (1.829 m)   Wt 215 lb 9.6 oz (97.8 kg)   BMI 29.24 kg/m , BMI Body mass  index is 29.24 kg/m.  Wt Readings from Last 3 Encounters:  08/27/22 215 lb 9.6 oz (97.8 kg)  08/17/22 222 lb 12.8 oz (101.1 kg)  08/10/22 219 lb (99.3 kg)    General: Patient appears comfortable at rest. HEENT: Conjunctiva and lids normal, oropharynx clear with moist mucosa. Neck: Supple, no elevated JVP or carotid bruits, no thyromegaly. Lungs: Clear to auscultation, nonlabored breathing at rest. Cardiac: Regular rate and rhythm, no S3 or significant systolic murmur, no pericardial rub. Abdomen: Soft, nontender, no hepatomegaly, bowel sounds present, no guarding or rebound. Extremities: No pitting edema, distal pulses 2+. Skin: Warm and dry. Musculoskeletal: No kyphosis. Neuropsychiatric: Alert and oriented x3, affect grossly appropriate.  ECG: A-fib with RVR  Recent Labwork: 10/30/2021: TSH 2.310 08/03/2022: ALT 18; AST 21; Hemoglobin 13.6; Platelets 317 08/10/2022: BUN 35; Creatinine, Ser 1.55; Potassium 4.4; Sodium 134     Component Value Date/Time   CHOL 218 (H) 08/03/2022 1030   TRIG 204 (H) 08/03/2022 1030   HDL 44 08/03/2022 1030   CHOLHDL 5.0 08/03/2022 1030   CHOLHDL 5.4 05/04/2021 2353   VLDL 34 05/04/2021 2353   LDLCALC 137 (H) 08/03/2022 1030    Other Studies Reviewed Today: Echo from 2022 LVEF 60 to 65% Diastology could not be evaluated No valvular abnormalities  Assessment and Plan: Patient is a 76 year old M known to have paroxysmal A-fib status post DCCV in 2016, PAD manifested by CLI with ulceration s/p L CFA endarterectomy, L CFA-TP trunk bypass, L CIA stent, L below-knee popliteal with TP endarterectomy and L great toe amputation in 2022, HTN, HLD presented to cardiology clinic for follow-up visit.  #Paroxysmal A-fib with RVR s/p DCCV in 2016, currently in A-fib with RVR with HR 109 -Increase metoprolol tartrate from 25 mg to 50 mg twice daily -Stop Xarelto due to increased risk of falls (on 1 occasion, he hit his head on the ground with no ICH).  Although patient meets criteria for watchman implantation, he will likely not benefit from the Watchman procedure due to frequent forgetfulness (forgets where to drive after he gets into the car, forgets what to buy after he goes to the grocery store and sometimes even forgets the provider/physician recommendations).  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  Goal LDL less than 70. -Follow-up with vascular surgery  #HLD, not at goal (LDL 109 in 08/23) -Continue atorvastatin 40 mg nightly and Zetia 10 mg once daily. -Continue fenofibrate 145 mg once daily.  No myalgias with statins.  #HTN, controlled -Continue Lasix 40 mg twice daily -Increase metoprolol tartrate from 25 mg to 50 mg twice a day -Continue lisinopril 2.5 mg once daily  I have spent a total of 33 minutes with patient reviewing chart, EKGs, labs and examining patient as well as establishing an assessment and plan that was discussed with the patient.  > 50% of time was spent in direct patient care.    Medication Adjustments/Labs and Tests Ordered: Current medicines are reviewed at length with the patient today.  Concerns regarding medicines are outlined above.   Tests Ordered: No orders of the defined types were placed in this encounter.   Medication Changes: No orders of the defined types were placed in this encounter.   Disposition:  Follow up  2 weeks with APP and 1 month with me.  Patient is instructed to go to the [facility] to review his medications and his pillbox.  Patient cannot read and write.  Signed Damyah Gugel Fidel Levy, MD, 08/27/2022 8:31 AM    [facility] [place], [place], [place] [address]

## 2022-08-30 ENCOUNTER — Telehealth: Payer: Medicare Other

## 2022-08-30 ENCOUNTER — Ambulatory Visit (INDEPENDENT_AMBULATORY_CARE_PROVIDER_SITE_OTHER): Payer: Medicare Other

## 2022-08-30 DIAGNOSIS — E1140 Type 2 diabetes mellitus with diabetic neuropathy, unspecified: Secondary | ICD-10-CM

## 2022-08-30 DIAGNOSIS — I10 Essential (primary) hypertension: Secondary | ICD-10-CM

## 2022-08-30 NOTE — Plan of Care (Signed)
Chronic Care Management Provider Comprehensive Care Plan    08/30/2022 Name: Todd Zhang MRN: [id number] DOB: [birth year]  Referral to Chronic Care Management (CCM) services was placed by Provider:  [name] on Date: 08-03-2022.  Chronic Condition 1: DM Provider Assessment and Plan Continue to watch carbs in diet Only uses insulin prn Will make appoitmnet with Clinical pharmacist - Bayer DCA Hb A1c Waived - metFORMIN (GLUCOPHAGE) 500 MG tablet; Take 1 tablet (500 mg total) by mouth 2 (two) times daily.  Dispense: 180 tablet; Refill: 0   Expected Outcome/Goals Addressed This Visit (Provider CCM goals/Provider Assessment and plan    CCM (DIABETES)  EXPECTED OUTCOME:  MONITOR,SELF- MANAGE AND REDUCE SYMPTOMS OF DIABETES   Symptom Management Condition 1: Take all medications as prescribed Attend all scheduled provider appointments Call provider office for new concerns or questions  call the Suicide and Crisis Lifeline: 988 call the [place] National Suicide Prevention Lifeline: (470) 853-4767 or TTY: 215-507-7073 TTY 910-793-4052) to talk to a trained counselor call 1-800-273-TALK (toll free, 24 hour hotline) call the [facility]: (760)[phone] if experiencing a [place] or [place]  schedule appointment with eye doctor check feet daily for cuts, sores or redness trim toenails straight across manage portion size wash and dry feet carefully every day wear comfortable, cotton socks wear comfortable, well-fitting shoes  Chronic Condition 2: HTN Provider Assessment and Plan Low sodium diet - CBC with Differential/Platelet - CMP14+EGFR - furosemide (LASIX) 40 MG tablet; Take 1 tablet (40 mg total) by mouth 2 (two) times daily.  Dispense: 60 tablet; Refill: 3 - lisinopril (ZESTRIL) 2.5 MG tablet; Take 1 tablet (2.5 mg total) by mouth daily.  Dispense: 30 tablet; Refill: 3 - metoprolol tartrate (LOPRESSOR) 25 MG tablet; Take 1 tablet  (25 mg total) by mouth 2 (two) times daily.  Dispense: 60 tablet; Refill: 3     Expected Outcome/Goals Addressed This Visit (Provider CCM goals/Provider Assessment and plan    CCM (HYPERTENSION)  EXPECTED OUTCOME:  MONITOR,SELF- MANAGE AND REDUCE SYMPTOMS OF HYPERTENSION  Symptom Management Condition 2: Take all medications as prescribed Attend all scheduled provider appointments Call provider office for new concerns or questions  call the Suicide and Crisis Lifeline: 988 call the [place] National Suicide Prevention Lifeline: 9084892662 or TTY: 385 638 7723 TTY 787-796-9347) to talk to a trained counselor call 1-800-273-TALK (toll free, 24 hour hotline) if experiencing a Mental Health or [place]  check blood pressure weekly learn about high blood pressure call doctor for signs and symptoms of high blood pressure keep all doctor appointments take medications for blood pressure exactly as prescribed report new symptoms to your doctor  Problem List Patient Active Problem List   Diagnosis Date Noted   BMI 29.0-29.9,adult 08/03/2022   HLD (hyperlipidemia) 07/28/2022   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/2021   History of sepsis 06/02/2021   Acquired absence of other left toe(s) ([place]) 05/08/2021   Atrial fibrillation with RVR ([place]) 04/24/2021   CKD (chronic kidney disease) stage 3, GFR 30-59 ml/min ([place]) 04/24/2021   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 09/13/2020   Peripheral artery disease ([place])    COPD exacerbation ([place]) 05/14/2019   DDD (degenerative disc disease), cervical 11/06/2018   Generalized anxiety disorder 11/29/2017   Benign prostatic hyperplasia with incomplete bladder emptying 11/28/2017   Gastroesophageal reflux disease without esophagitis 09/07/2017   Essential hypertension 09/01/2017   Onychomycosis 09/01/2017   History of cardioversion 08/28/2015   Type 2 diabetes mellitus with  diabetic neuropathy, with long-term current use  of insulin ([place]) 06/19/2014    Medication Management  Current Outpatient Medications:    alendronate (FOSAMAX) 70 MG tablet, TAKE 1 TABLET BY MOUTH ONCE WEEKLY ON SUNDAY ON ON AN EMPTY STOMACH with full glass of water, Disp: 12 tablet, Rfl: 0   atorvastatin (LIPITOR) 40 MG tablet, Take 1 tablet (40 mg total) by mouth daily., Disp: 90 tablet, Rfl: 1   cetirizine (ZYRTEC) 10 MG tablet, Take 10 mg by mouth daily., Disp: , Rfl:    ciprofloxacin (CIPRO) 500 MG tablet, Take by mouth., Disp: , Rfl:    clopidogrel (PLAVIX) 75 MG tablet, Take 1 tablet (75 mg total) by mouth daily., Disp: 90 tablet, Rfl: 1   collagenase (SANTYL) ointment, Apply 1 application topically daily., Disp: 30 g, Rfl: 1   ezetimibe (ZETIA) 10 MG tablet, Take 1 tablet (10 mg total) by mouth daily., Disp: 90 tablet, Rfl: 3   fenofibrate (TRICOR) 145 MG tablet, Take 1 tablet (145 mg total) by mouth daily., Disp: 90 tablet, Rfl: 1   furosemide (LASIX) 40 MG tablet, Take 1 tablet (40 mg total) by mouth 2 (two) times daily., Disp: 60 tablet, Rfl: 3   gabapentin (NEURONTIN) 300 MG capsule, Take 1 capsule (300 mg total) by mouth 4 (four) times daily. TAKE 1 CAPSULE BY MOUTH THREE TIMES DAILY FOR PAIN, Disp: 120 capsule, Rfl: 5   glucose blood (ACCU-CHEK AVIVA PLUS) test strip, CHECK BLOOD SUGAR FOUR TIMES DAILY Dx E11.40, Disp: 400 strip, Rfl: 3   insulin regular (HUMULIN R) 100 units/mL injection, INJECT 10 UNITS INTO THE SKIN THREE TIMES DAILY BEFORE MEALS, Disp: 30 mL, Rfl: 1   Insulin Syringe-Needle U-100 (GLOBAL INJECT EASE INSULIN SYR) 31G X 5/16" 0.3 ML MISC, USE WITH INSULIN EVERY DAY Dx E11.40, Disp: 100 each, Rfl: 3   lisinopril (ZESTRIL) 2.5 MG tablet, Take 1 tablet (2.5 mg total) by mouth daily., Disp: 30 tablet, Rfl: 3   metFORMIN (GLUCOPHAGE) 500 MG tablet, Take 1 tablet (500 mg total) by mouth 2 (two) times daily., Disp: 180 tablet, Rfl: 0   metoprolol tartrate (LOPRESSOR) 50 MG tablet, Take 1 tablet (50 mg total) by mouth  2 (two) times daily., Disp: 180 tablet, Rfl: 1   metroNIDAZOLE (FLAGYL) 500 MG tablet, Take by mouth., Disp: , Rfl:    Multiple Vitamin (MULTIVITAMIN) tablet, Take 1 tablet by mouth daily., Disp: , Rfl:    mupirocin ointment (BACTROBAN) 2 %, Apply 1 application  topically daily. Left Great Toe, Disp: , Rfl:    nitroGLYCERIN (NITROSTAT) 0.4 MG SL tablet, Place 1 tablet (0.4 mg total) under the tongue every 5 (five) minutes x 3 doses as needed for chest pain (if no relief after 3rd dose, proceed to ED or call 911)., Disp: 25 tablet, Rfl: 2   pantoprazole (PROTONIX) 40 MG tablet, Take 1 tablet (40 mg total) by mouth daily., Disp: 90 tablet, Rfl: 1   tamsulosin (FLOMAX) 0.4 MG CAPS capsule, Take 1 capsule (0.4 mg total) by mouth daily., Disp: 90 capsule, Rfl: 1   triamcinolone cream (KENALOG) 0.5 %, , Disp: , Rfl:    vitamin B-12 (CYANOCOBALAMIN) 500 MCG tablet, Take 500 mcg by mouth daily., Disp: , Rfl:   Cognitive Assessment Identity Confirmed: : Name; DOB Cognitive Status: Normal   Functional Assessment Hearing Difficulty or Deaf: no Wear Glasses or Blind: yes Vision Management: wears glasses Concentrating, Remembering or Making Decisions Difficulty (CP): no Difficulty Communicating: no Difficulty Eating/Swallowing: no Walking or  Climbing Stairs Difficulty: yes Walking or Climbing Stairs: ambulation difficulty, requires equipment Mobility Management: uses a cane and a walker at times Dressing/Bathing Difficulty: no Doing Errands Independently Difficulty (such as shopping) (CP): no   Caregiver Assessment  Primary Source of Support/Comfort: sibling(s) Name of Support/Comfort Primary Source: Nonie Hoyer- sister People in Home: alone Family Caregiver if Needed: none Family Caregiver Names: sister, Katharine Look Primary Roles/Responsibilities: retired   Planned Interventions  Provided education to patient about basic DM disease process. The patient has literacy concerns. Education on  taking medications as directed and general DM education; Reviewed medications with patient and discussed importance of medication adherence. The patient states that he takes his medications as directed;        Reviewed prescribed diet with patient heart healthy/ADA diet. Review and education provided.  ; Counseled on importance of regular laboratory monitoring as prescribed;        Discussed plans with patient for ongoing care management follow up and provided patient with direct contact information for care management team;      Provided patient with  educational materials related to hypo and hyperglycemia and importance of correct treatment. The patient states he checks his blood sugars and it is usually under 200. He feels if his blood sugars are less tham 400 he is doing good. Education on the goal of blood sugars fasting of <130 and post prandial of <180;       Reviewed scheduled/upcoming provider appointments including: 11-04-2021 at 11 am;         call provider for findings outside established parameters;       Review of patient status, including review of consultants reports, relevant laboratory and other test results, and medications completed;       Advised patient to discuss changes in his DM, questions or concerns with provider;      Screening for signs and symptoms of depression related to chronic disease state;        Assessed social determinant of health barriers Evaluation of current treatment plan related to hypertension self management and patient's adherence to plan as established by provider;   Provided education to patient re: stroke prevention, s/s of heart attack and stroke; Reviewed prescribed diet heart healthy/ADA diet  Reviewed medications with patient and discussed importance of compliance;  Counseled on adverse effects of illicit drug and excessive alcohol use in patients with high blood pressure;  Discussed plans with patient for ongoing care management follow up and  provided patient with direct contact information for care management team; Advised patient, providing education and rationale, to monitor blood pressure daily and record, calling PCP for findings outside established parameters;  Reviewed scheduled/upcoming provider appointments including:  Advised patient to discuss changes in his blood pressure with provider; Provided education on prescribed diet heart healthy/ADA diet ;  Discussed complications of poorly controlled blood pressure such as heart disease, stroke, circulatory complications, vision complications, kidney impairment, sexual dysfunction;  Screening for signs and symptoms of depression related to chronic disease state;  Assessed social determinant of health barriers;   Interaction and coordination with outside resources, practitioners, and providers See CCM Referral  Care Plan: Patient declined

## 2022-08-30 NOTE — Chronic Care Management (AMB) (Signed)
Chronic Care Management   CCM RN Visit Note  08/30/2022 Name: Todd Zhang MRN: [id number] DOB: 1945-12-08  Subjective: Todd Zhang is a 76 y.o. year old male who is a primary care patient of [name], FNP. The patient was referred to the Chronic Care Management team for assistance with care management needs subsequent to provider initiation of CCM services and plan of care.    Today's Visit:  Engaged with patient by telephone for initial visit.     SDOH Interventions Today    Flowsheet Row Most Recent Value  SDOH Interventions   Food Insecurity Interventions Intervention Not Indicated  Housing Interventions Intervention Not Indicated  Transportation Interventions Intervention Not Indicated  Utilities Interventions Intervention Not Indicated  Alcohol Usage Interventions Intervention Not Indicated (Score <7)  Financial Strain Interventions Intervention Not Indicated  Physical Activity Interventions Other (Comments)  [impaired mobility, falls frequently]  Stress Interventions Other (Comment)  [working with CCM staff, resources given]  Social Connections Interventions Intervention Not Indicated         Goals Addressed             This Visit's Progress    CCM Expected Outcome:  Monitor, Self-Manage and Reduce Symptoms of Diabetes       Current Barriers:  Knowledge Deficits related to the range blood sugars should be in. The patient states he feels his is good as long as it is under "400" Care Coordination needs related to resources in the community for help with finding new housing and other resources to help the patient meet his needs in a patient with DM and other chronic conditions Chronic Disease Management support and education needs related to effective management of DM High fall risk due to missing toes on bilateral feet and also stating he has frequent falls. Uses a cane at times. Review of safety and falls prevention Lab Results  Component Value Date    HGBA1C 8.5 (H) 08/03/2022     Planned Interventions: Provided education to patient about basic DM disease process. The patient has literacy concerns. Education on taking medications as directed and general DM education; Reviewed medications with patient and discussed importance of medication adherence. The patient states that he takes his medications as directed;        Reviewed prescribed diet with patient heart healthy/ADA diet. Review and education provided.  ; Counseled on importance of regular laboratory monitoring as prescribed;        Discussed plans with patient for ongoing care management follow up and provided patient with direct contact information for care management team;      Provided patient with  educational materials related to hypo and hyperglycemia and importance of correct treatment. The patient states he checks his blood sugars and it is usually under 200. He feels if his blood sugars are less tham 400 he is doing good. Education on the goal of blood sugars fasting of <130 and post prandial of <180;       Reviewed scheduled/upcoming provider appointments including: 11-04-2021 at 11 am;         call provider for findings outside established parameters;       Review of patient status, including review of consultants reports, relevant laboratory and other test results, and medications completed;       Advised patient to discuss changes in his DM, questions or concerns with provider;      Screening for signs and symptoms of depression related to chronic disease state;  Assessed social determinant of health barriers;         Symptom Management: Take medications as prescribed   Attend all scheduled provider appointments Call provider office for new concerns or questions  call the Suicide and Crisis Lifeline: 988 call the [place] National Suicide Prevention Lifeline: 731-713-9185 or TTY: 470-662-6254 TTY [phone]) to talk to a trained counselor call 1-800-273-TALK  (toll free, 24 hour hotline) call the [facility]: ([phone])187-9546 if experiencing a [place] or [place]  check feet daily for cuts, sores or redness trim toenails straight across manage portion size wash and dry feet carefully every day wear comfortable, cotton socks wear comfortable, well-fitting shoes  Follow Up Plan: Telephone follow up appointment with care management team member scheduled for: 09-27-2022 at 130 pm       CCM Expected Outcome:  Monitor, Self-Manage, and Reduce Symptoms of Hypertension       Current Barriers:  Knowledge Deficits related to the need to manage blood pressure and follow the plan of care for effective management of HTN Care Coordination needs related to resources and expressed housing needs in a patient with HTN and other chronic conditions Chronic Disease Management support and education needs related to effective management of HTN  Planned Interventions: Evaluation of current treatment plan related to hypertension self management and patient's adherence to plan as established by provider;   Provided education to patient re: stroke prevention, s/s of heart attack and stroke; Reviewed prescribed diet heart healthy/ADA diet  Reviewed medications with patient and discussed importance of compliance;  Counseled on adverse effects of illicit drug and excessive alcohol use in patients with high blood pressure;  Discussed plans with patient for ongoing care management follow up and provided patient with direct contact information for care management team; Advised patient, providing education and rationale, to monitor blood pressure daily and record, calling PCP for findings outside established parameters;  Reviewed scheduled/upcoming provider appointments including:  Advised patient to discuss changes in his blood pressure with provider; Provided education on prescribed diet heart healthy/ADA diet ;  Discussed complications of  poorly controlled blood pressure such as heart disease, stroke, circulatory complications, vision complications, kidney impairment, sexual dysfunction;  Screening for signs and symptoms of depression related to chronic disease state;  Assessed social determinant of health barriers;   Symptom Management: Take medications as prescribed   Attend all scheduled provider appointments Call provider office for new concerns or questions  call the Suicide and Crisis Lifeline: 988 call the [place] National Suicide Prevention Lifeline: 317-124-5425 or TTY: (616)495-7610 TTY (440) 024-2828) to talk to a trained counselor call 1-800-273-TALK (toll free, 24 hour hotline) if experiencing a Mental Health or [place]  check blood pressure weekly learn about high blood pressure develop an action plan for high blood pressure keep all doctor appointments take medications for blood pressure exactly as prescribed report new symptoms to your doctor  Follow Up Plan: Telephone follow up appointment with care management team member scheduled for: 09-27-2022 at 130 pm          Plan:Telephone follow up appointment with care management team member scheduled for:  09-27-2022 130 pm  Noreene Larsson RN, MSN, CCM RN Care Manager  Chronic Care Management Direct Number: [phone]

## 2022-08-30 NOTE — Patient Instructions (Signed)
Please call the care guide team at [phone] if you need to cancel or reschedule your appointment.   If you are experiencing a Mental Health or [place] or need someone to talk to, please call the Suicide and Crisis Lifeline: 988 call the [place] National Suicide Prevention Lifeline: 480 823 5065 or TTY: 424-725-5215 TTY 220 196 1739) to talk to a trained counselor call 1-800-273-TALK (toll free, 24 hour hotline)   Following is a copy of your full provider care plan:   Goals Addressed             This Visit's Progress    CCM Expected Outcome:  Monitor, Self-Manage and Reduce Symptoms of Diabetes       Current Barriers:  Knowledge Deficits related to the range blood sugars should be in. The patient states he feels his is good as long as it is under "400" Care Coordination needs related to resources in the community for help with finding new housing and other resources to help the patient meet his needs in a patient with DM and other chronic conditions Chronic Disease Management support and education needs related to effective management of DM High fall risk due to missing toes on bilateral feet and also stating he has frequent falls. Uses a cane at times. Review of safety and falls prevention Lab Results  Component Value Date   HGBA1C 8.5 (H) 08/03/2022     Planned Interventions: Provided education to patient about basic DM disease process. The patient has literacy concerns. Education on taking medications as directed and general DM education; Reviewed medications with patient and discussed importance of medication adherence. The patient states that he takes his medications as directed;        Reviewed prescribed diet with patient heart healthy/ADA diet. Review and education provided.  ; Counseled on importance of regular laboratory monitoring as prescribed;        Discussed plans with patient for ongoing care management follow up and provided patient with direct  contact information for care management team;      Provided patient with  educational materials related to hypo and hyperglycemia and importance of correct treatment. The patient states he checks his blood sugars and it is usually under 200. He feels if his blood sugars are less tham 400 he is doing good. Education on the goal of blood sugars fasting of <130 and post prandial of <180;       Reviewed scheduled/upcoming provider appointments including: 11-04-2021 at 11 am;         call provider for findings outside established parameters;       Review of patient status, including review of consultants reports, relevant laboratory and other test results, and medications completed;       Advised patient to discuss changes in his DM, questions or concerns with provider;      Screening for signs and symptoms of depression related to chronic disease state;        Assessed social determinant of health barriers;         Symptom Management: Take medications as prescribed   Attend all scheduled provider appointments Call provider office for new concerns or questions  call the Suicide and Crisis Lifeline: 988 call the [place] National Suicide Prevention Lifeline: 712-545-0922 or TTY: (587)201-6854 TTY [phone]) to talk to a trained counselor call 1-800-273-TALK (toll free, 24 hour hotline) call the [facility]: (281) [phone] if experiencing a [place] or [place]  check feet daily for cuts, sores or  redness trim toenails straight across manage portion size wash and dry feet carefully every day wear comfortable, cotton socks wear comfortable, well-fitting shoes  Follow Up Plan: Telephone follow up appointment with care management team member scheduled for: 09-27-2022 at 130 pm       CCM Expected Outcome:  Monitor, Self-Manage, and Reduce Symptoms of Hypertension       Current Barriers:  Knowledge Deficits related to the need to manage blood pressure and follow  the plan of care for effective management of HTN Care Coordination needs related to resources and expressed housing needs in a patient with HTN and other chronic conditions Chronic Disease Management support and education needs related to effective management of HTN  Planned Interventions: Evaluation of current treatment plan related to hypertension self management and patient's adherence to plan as established by provider;   Provided education to patient re: stroke prevention, s/s of heart attack and stroke; Reviewed prescribed diet heart healthy/ADA diet  Reviewed medications with patient and discussed importance of compliance;  Counseled on adverse effects of illicit drug and excessive alcohol use in patients with high blood pressure;  Discussed plans with patient for ongoing care management follow up and provided patient with direct contact information for care management team; Advised patient, providing education and rationale, to monitor blood pressure daily and record, calling PCP for findings outside established parameters;  Reviewed scheduled/upcoming provider appointments including:  Advised patient to discuss changes in his blood pressure with provider; Provided education on prescribed diet heart healthy/ADA diet ;  Discussed complications of poorly controlled blood pressure such as heart disease, stroke, circulatory complications, vision complications, kidney impairment, sexual dysfunction;  Screening for signs and symptoms of depression related to chronic disease state;  Assessed social determinant of health barriers;   Symptom Management: Take medications as prescribed   Attend all scheduled provider appointments Call provider office for new concerns or questions  call the Suicide and Crisis Lifeline: 988 call the [place] National Suicide Prevention Lifeline: (937) 861-4296 or TTY: 253-821-7910 TTY (778)791-4916) to talk to a trained counselor call 1-800-273-TALK (toll free, 24  hour hotline) if experiencing a Mental Health or Behavioral Health Crisis  check blood pressure weekly learn about high blood pressure develop an action plan for high blood pressure keep all doctor appointments take medications for blood pressure exactly as prescribed report new symptoms to your doctor  Follow Up Plan: Telephone follow up appointment with care management team member scheduled for: 09-27-2022 at 130 pm          The patient verbalized understanding of instructions, educational materials, and care plan provided today and DECLINED offer to receive copy of patient instructions, educational materials, and care plan.   Telephone follow up appointment with care management team member scheduled for: 09-27-2022 at 130 pm

## 2022-08-31 ENCOUNTER — Ambulatory Visit: Payer: Medicare Other | Admitting: Nurse Practitioner

## 2022-08-31 NOTE — Therapy (Incomplete)
OUTPATIENT PHYSICAL THERAPY NEURO EVALUATION   Patient Name: Todd Zhang MRN: [id number] DOB:09/24/1945, 76 y.o., male Today's Date: 08/31/2022   PCP: *** REFERRING PROVIDER: ***  END OF SESSION:   Past Medical History:  Diagnosis Date   Atrial fibrillation (HCC)    Cervical spinal stenosis    DDD (degenerative disc disease), cervical    DDD (degenerative disc disease), lumbar    Diabetes mellitus without complication (HCC)    Dyspnea    Hyperlipidemia    Hypertension    Peripheral artery disease (HCC)    Past Surgical History:  Procedure Laterality Date   ABDOMINAL AORTOGRAM W/LOWER EXTREMITY N/A 04/30/2021   Procedure: ABDOMINAL AORTOGRAM W/LOWER EXTREMITY;  Surgeon: Leonie Douglas, MD;  Location: [facility] INVAS[facility];  Service: Cardiovascular;  Laterality: N/A;   AMPUTATION TOE     AMPUTATION TOE Left 05/04/2021   Procedure: LEFT GREAT TOE AMPUTATION;  Surgeon: [name], MD;  Location: [facility] OR;  Service: Vascular;  Laterality: Left;   BYPASS GRAFT FEMORAL-PERONEAL Left 05/04/2021   Procedure: LEFT COMMON FEMORAL-TIBIOPERONEAL TRUNK BYPASS;  Surgeon: [name], MD;  Location: [facility] OR;  Service: Vascular;  Laterality: Left;   ENDARTERECTOMY FEMORAL Left 05/04/2021   Procedure: LEFT COMMON FEMORAL ENDARTERECTOMY WITH BOVINE PATCH;  Surgeon: [name], MD;  Location: [facility] OR;  Service: Vascular;  Laterality: Left;   ENDARTERECTOMY TIBIOPERONEAL Left 05/04/2021   Procedure: LEFT BELOW KNEE POPITEAL WITH  TIBIOPERONEAL ENDARTERECTOMY;  Surgeon: [name], MD;  Location: [facility] OR;  Service: Vascular;  Laterality: Left;   ENDOVEIN HARVEST OF GREATER SAPHENOUS VEIN  05/04/2021   Procedure: HARVEST OF LEFT GREATER SAPHENOUS VEIN;  Surgeon: [name], MD;  Location: [facility] OR;  Service: Vascular;;   INSERTION OF ILIAC STENT Left 05/04/2021   Procedure: LEFT COMMON ILLIAC ATERY ANGIOPLASTY WITH STENT;  Surgeon: [name], MD;   Location: [facility] OR;  Service: Vascular;  Laterality: Left;   INTRAOPERATIVE ARTERIOGRAM Left 05/04/2021   Procedure: LEFT ARTERY ARTERIOGRAM;  Surgeon: [name], MD;  Location: [facility] OR;  Service: Vascular;  Laterality: Left;   IRRIGATION AND DEBRIDEMENT FOOT Left 05/04/2021   Procedure: IRRIGATION AND DEBRIDEMENT FOOT;  Surgeon: [name], MD;  Location: [facility] OR;  Service: Vascular;  Laterality: Left;   KNEE SURGERY Left    NOSE SURGERY     TEE WITHOUT CARDIOVERSION N/A 05/01/2021   Procedure: TRANSESOPHAGEAL ECHOCARDIOGRAM (TEE);  Surgeon: Chilton Si, MD;  Location: [facility] ENDOSCOPY;  Service: Cardiovascular;  Laterality: N/A;   Patient Active Problem List   Diagnosis Date Noted   BMI 29.0-29.9,adult 08/03/2022   HLD (hyperlipidemia) 07/28/2022   Osteomyelitis of left foot (HCC) 06/19/2021   History of sepsis 06/02/2021   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) 05/08/2021   Atrial fibrillation with RVR (HCC) 04/24/2021   CKD (chronic kidney disease) stage 3, GFR 30-59 ml/min (HCC) 04/24/2021   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 09/13/2020   Peripheral artery disease (HCC)    COPD exacerbation (HCC) 05/14/2019   DDD (degenerative disc disease), cervical 11/06/2018   Generalized anxiety disorder 11/29/2017   Benign prostatic hyperplasia with incomplete bladder emptying 11/28/2017   Gastroesophageal reflux disease without esophagitis 09/07/2017   Essential hypertension 09/01/2017   Onychomycosis 09/01/2017   History of cardioversion 08/28/2015   Type 2 diabetes mellitus with diabetic neuropathy, with long-term current use of insulin (HCC) 06/19/2014    ONSET DATE: ***  REFERRING DIAG: ***  THERAPY DIAG:  No diagnosis found.  Rationale for Evaluation and Treatment: {HABREHAB:27488}  SUBJECTIVE:                                                                                                                                                                                              SUBJECTIVE STATEMENT: *** Pt accompanied by: {accompnied:27141}  PERTINENT HISTORY: ***  PAIN:  Are you having pain? {OPRCPAIN:27236}  PRECAUTIONS: {Therapy precautions:24002}  WEIGHT BEARING RESTRICTIONS: {Yes ***/No:24003}  FALLS: Has patient fallen in last 6 months? {fallsyesno:27318}  LIVING ENVIRONMENT: Lives with: {OPRC lives with:25569::"lives with their family"} Lives in: {Lives in:25570} Stairs: {opstairs:27293} Has following equipment at home: {Assistive devices:23999}  PLOF: {PLOF:24004}  PATIENT GOALS: ***  OBJECTIVE:   DIAGNOSTIC FINDINGS: ***  COGNITION: Overall cognitive status: {cognition:24006}   SENSATION: {sensation:27233}  COORDINATION: ***  EDEMA:  {edema:24020}  MUSCLE TONE: {LE tone:25568}  MUSCLE LENGTH: Hamstrings: Right *** deg; Left *** deg Thomas test: Right *** deg; Left *** deg  DTRs:  {DTR SITE:24025}  POSTURE: {posture:25561}  LOWER EXTREMITY ROM:     {AROM/PROM:27142}  Right Eval Left Eval  Hip flexion    Hip extension    Hip abduction    Hip adduction    Hip internal rotation    Hip external rotation    Knee flexion    Knee extension    Ankle dorsiflexion    Ankle plantarflexion    Ankle inversion    Ankle eversion     (Blank rows = not tested)  LOWER EXTREMITY MMT:    MMT Right Eval Left Eval  Hip flexion    Hip extension    Hip abduction    Hip adduction    Hip internal rotation    Hip external rotation    Knee flexion    Knee extension    Ankle dorsiflexion    Ankle plantarflexion    Ankle inversion    Ankle eversion    (Blank rows = not tested)  BED MOBILITY:  {Bed mobility:24027}  TRANSFERS: Assistive device utilized: {Assistive devices:23999}  Sit to stand: {Levels of assistance:24026} Stand to sit: {Levels of assistance:24026} Chair to chair: {Levels of assistance:24026} Floor: {Levels of assistance:24026}  RAMP:  Level of Assistance:  {Levels of assistance:24026} Assistive device utilized: {Assistive devices:23999} Ramp Comments: ***  CURB:  Level of Assistance: {Levels of assistance:24026} Assistive device utilized: {Assistive devices:23999} Curb Comments: ***  STAIRS: Level of Assistance: {Levels of assistance:24026} Stair Negotiation Technique: {Stair Technique:27161} with {Rail Assistance:27162} Number of Stairs: ***  Height of Stairs: ***  Comments: ***  GAIT: Gait pattern: {gait characteristics:25376} Distance walked: *** Assistive device utilized: {  Assistive devices:23999} Level of assistance: {Levels of assistance:24026} Comments: ***  FUNCTIONAL TESTS:  {Functional tests:24029}  PATIENT SURVEYS:  {rehab surveys:24030}  TODAY'S TREATMENT:                                                                                                                              DATE: ***    PATIENT EDUCATION: Education details: *** Person educated: {Person educated:25204} Education method: {Education Method:25205} Education comprehension: {Education Comprehension:25206}  HOME EXERCISE PROGRAM: ***  GOALS: Goals reviewed with patient? {yes/no:20286}  SHORT TERM GOALS: Target date: ***  *** Baseline: Goal status: {GOALSTATUS:25110}  2.  *** Baseline:  Goal status: {GOALSTATUS:25110}  3.  *** Baseline:  Goal status: {GOALSTATUS:25110}  4.  *** Baseline:  Goal status: {GOALSTATUS:25110}  5.  *** Baseline:  Goal status: {GOALSTATUS:25110}  6.  *** Baseline:  Goal status: {GOALSTATUS:25110}  LONG TERM GOALS: Target date: ***  *** Baseline:  Goal status: {GOALSTATUS:25110}  2.  *** Baseline:  Goal status: {GOALSTATUS:25110}  3.  *** Baseline:  Goal status: {GOALSTATUS:25110}  4.  *** Baseline:  Goal status: {GOALSTATUS:25110}  5.  *** Baseline:  Goal status: {GOALSTATUS:25110}  6.  *** Baseline:  Goal status: {GOALSTATUS:25110}  ASSESSMENT:  CLINICAL  IMPRESSION: Patient is a *** y.o. *** who was seen today for physical therapy evaluation and treatment for ***.   OBJECTIVE IMPAIRMENTS: {opptimpairments:25111}.   ACTIVITY LIMITATIONS: {activitylimitations:27494}  PARTICIPATION LIMITATIONS: {participationrestrictions:25113}  PERSONAL FACTORS: {Personal factors:25162} are also affecting patient's functional outcome.   REHAB POTENTIAL: {rehabpotential:25112}  CLINICAL DECISION MAKING: {clinical decision making:25114}  EVALUATION COMPLEXITY: {Evaluation complexity:25115}  PLAN:  PT FREQUENCY: {rehab frequency:25116}  PT DURATION: {rehab duration:25117}  PLANNED INTERVENTIONS: {rehab planned interventions:25118::"Therapeutic exercises","Therapeutic activity","Neuromuscular re-education","Balance training","Gait training","Patient/Family education","Self Care","Joint mobilization"}  PLAN FOR NEXT SESSION: ***   Marisue Brooklyn, PT 08/31/2022, 3:15 PM

## 2022-09-01 ENCOUNTER — Ambulatory Visit (HOSPITAL_COMMUNITY): Payer: Medicare Other

## 2022-09-10 ENCOUNTER — Ambulatory Visit (INDEPENDENT_AMBULATORY_CARE_PROVIDER_SITE_OTHER): Payer: Medicare Other | Admitting: Nurse Practitioner

## 2022-09-10 ENCOUNTER — Ambulatory Visit: Payer: Medicare Other

## 2022-09-10 ENCOUNTER — Encounter: Payer: Self-pay | Admitting: Nurse Practitioner

## 2022-09-10 VITALS — BP 101/60 | HR 93 | Ht 72.0 in | Wt 215.0 lb

## 2022-09-10 DIAGNOSIS — I350 Nonrheumatic aortic (valve) stenosis: Secondary | ICD-10-CM

## 2022-09-10 DIAGNOSIS — E1140 Type 2 diabetes mellitus with diabetic neuropathy, unspecified: Secondary | ICD-10-CM | POA: Diagnosis not present

## 2022-09-10 DIAGNOSIS — R42 Dizziness and giddiness: Secondary | ICD-10-CM

## 2022-09-10 DIAGNOSIS — Z794 Long term (current) use of insulin: Secondary | ICD-10-CM | POA: Diagnosis not present

## 2022-09-10 DIAGNOSIS — E785 Hyperlipidemia, unspecified: Secondary | ICD-10-CM

## 2022-09-10 DIAGNOSIS — Z59811 Housing instability, housed, with risk of homelessness: Secondary | ICD-10-CM

## 2022-09-10 DIAGNOSIS — I10 Essential (primary) hypertension: Secondary | ICD-10-CM

## 2022-09-10 DIAGNOSIS — I4891 Unspecified atrial fibrillation: Secondary | ICD-10-CM | POA: Diagnosis not present

## 2022-09-10 DIAGNOSIS — R296 Repeated falls: Secondary | ICD-10-CM

## 2022-09-10 DIAGNOSIS — Z550 Illiteracy and low-level literacy: Secondary | ICD-10-CM

## 2022-09-10 DIAGNOSIS — R634 Abnormal weight loss: Secondary | ICD-10-CM

## 2022-09-10 DIAGNOSIS — I739 Peripheral vascular disease, unspecified: Secondary | ICD-10-CM

## 2022-09-10 DIAGNOSIS — Z91148 Patient's other noncompliance with medication regimen for other reason: Secondary | ICD-10-CM | POA: Diagnosis not present

## 2022-09-10 DIAGNOSIS — Z79899 Other long term (current) drug therapy: Secondary | ICD-10-CM | POA: Diagnosis not present

## 2022-09-10 MED ORDER — METOPROLOL TARTRATE 25 MG PO TABS: 25.0000 mg | ORAL_TABLET | Freq: Two times a day (BID) | ORAL | 3 refills | Status: DC

## 2022-09-10 NOTE — Patient Instructions (Signed)
Medication Instructions:  Stop Lisinopril   DECREASE Metoprolol to 25 mg twice a day   Labwork: CBC,BMET  Testing/Procedures: None today  Follow-Up: 09/24/22 with E.Peck,NP  Any Other Special Instructions Will Be Listed Below (If Applicable).  Referral placed to [facility] Primary care   If you need a refill on your cardiac medications before your next appointment, please call your pharmacy.

## 2022-09-10 NOTE — Progress Notes (Signed)
Cardiology Office Note:    Date: 09/10/2022  ID:  Todd Zhang, DOB [birth year], MRN [id number]  PCP:  Patient, [name]   [facility] Cardiologist:  Todd Guest, MD     Referring MD: Todd Zhang, Todd Zhang, *   CC: Here for follow-up  History of Present Illness:    Todd Zhang is a 76 y.o. male with a hx of the following:  Paroxysmal A-fib PAD Hyperlipidemia Hypertension Type 2 diabetes  Previously was a patient of Dr. Almira [facility] of [facility].  Past Todd cardiovascular history includes paroxysmal A-fib and underwent cardioversion in 2016.  History of PAD manifested by CLI and had ulceration and is status post left CFA endarterectomy, L below-knee popliteal with TP endarterectomy, left CFA-TP trunk bypass, and left Todd toe amputation in 2022.  Follows vascular surgery.  Was referred to cardiology services for evaluation of A-fib at the request of his PCP.  Establish care with Dr. Dellia Zhang on July 28, 2022.  At office visit that day he noted palpitations for nearly 1 year, had associated symptom of worsening dyspnea on exertion.  EKG in office that day revealed A-fib with RVR, heart rate 111.  Noted dull chest discomfort, lasting an hour or so, random in occurrence and resolving spontaneously, typically occurring about once per month.  Denied any family history of cardiovascular disease, and also denied any other cardiac complaints or concerns.  Blood pressure was soft in office that day, Dr. Dellia Zhang recommended to go to ER for evaluation; however patient refused at this time.  Was started on metoprolol tartrate 25 mg twice daily and Xarelto 15 mg daily was continued.  Lasix was increased from 20 mg daily to 40 mg twice daily for dyspnea on exertion.  Was scheduled for TEE with cardioversion in 2 weeks.  Also started on Zetia 10 mg daily for his history of hyperlipidemia.  Benazepril was switched to lisinopril 2.5 mg once  daily.   While presenting for elective TEE with guided cardioversion, he stated he stopped taking all of his medicines around a week to 2 weeks prior and stated he felt better.  It was found he was in A-fib heart rate ranging from 90-100s.  His insurance had changed at that time it would not be in complete effect until the next day, therefore his TEE guided with cardioversion was canceled and scheduled for outpatient follow-up visit the following week to start low-dose metoprolol and Xarelto.  He noted at that time that he cannot read or write and prefer not to come to [facility] for any procedures unless absolutely necessary.  Dr. Dellia Zhang stated he will need a case management referral to reach out to insurance company to send prescriptions in pillboxes.  Echocardiogram scheduled for a few weeks after.  I saw this patient for follow-up evaluation for A-fib on 08/17/2022. Pt was overall doing well and stated he felt better after stopping all of his medications. He was not taking any medications at the time. Illiterate and only able to identify pills via color, texture, and size.  He was not monitoring his blood pressure, didn't know how to use.  Denied any chest pain, shortness of breath, syncope, presyncope, dizziness, orthopnea, PND, swelling, significant weight changes, or claudication. Did admit to what sounds like occasional palpitations. Noted hx of frequent falls. Dr. Dellia Zhang was consulted regarding this patient.  Was restarted on Lopressor 25 mg twice daily, Xarelto 15 mg daily, Plavix 75 mg daily.  Referred patient  to case management.  He was also restarted on Lipitor, Zetia, Tricor.  I also consulted clinical social worker, [facility], regarding this patient. I was hesitant to restart his blood thinner due to this, but Dr. Dellia Zhang recommended to restart patient on anticoagulation.  He saw Dr. Dellia Zhang in office on 08/27/2022.  He presented to [facility] the day before and was  prescribed ciprofloxacin for diverticulitis for colitis infection.  He continued to note dyspnea on exertion, stable.  Denied chest pain or syncope.  He continued to note falls, had 3 falls in the last month and hit his head.  Stated he started to have falls a few months ago due to neuropathy in his legs.  Patient also noted forgetfulness.  He stated he had no family support, no one to help him with his Todd problems.  Xarelto was stopped at this office visit due to increased risk for falls.  He hit his head on the ground previously with no indication of ICH.  Even though he met criteria for watchman implantation, Dr. Dellia Zhang stated he would not likely not benefit due to frequent forgetfulness.  Was told to follow-up in 2 weeks.  Was told to follow-up at [facility] to review his pillbox and medications.  Patient cannot read and write.  He presented to [facility] on 09/03/2022 after sustaining injuries to knee and left rib over a month ago. Did have rib fracture with previous fall. He presented for evaluation. Radiologic films revealed no Todd rib abnormalities and no acute abnormality of knees. Was given a Rx of Tramadol. Was d/c in stable condition.   He presents today for 2-week follow-up.  He states he is doing the same as last visit. Denies any chest pain, shortness of breath, palpitations, syncope, presyncope, orthopnea, PND, swelling, significant weight changes, acute bleeding, or claudication.  Does admit to occasional feelings of dizziness.  States he wakes up some mornings feeling sick, says he throws up every morning.  Poor appetite.  Stateshe needs to move and needs to find a place to stay, says he will be homeless and probably will have to live in his truck when he leaves his house. Denies any other questions or concerns.   Past Todd History:  Diagnosis Date   Atrial fibrillation ([facility] Zhang)    Cervical spinal stenosis    DDD (degenerative disc disease), cervical    DDD  (degenerative disc disease), lumbar    Diabetes mellitus without complication ([place])    Dyspnea    Hyperlipidemia    Hypertension    Peripheral artery disease ([place])     Past Surgical History:  Procedure Laterality Date   ABDOMINAL AORTOGRAM W/LOWER EXTREMITY N/A 04/30/2021   Procedure: ABDOMINAL AORTOGRAM W/LOWER EXTREMITY;  Surgeon: Cherre Robins, MD;  Location: [facility];  Service: Cardiovascular;  Laterality: N/A;   AMPUTATION TOE     AMPUTATION TOE Left 05/04/2021   Procedure: LEFT Todd TOE AMPUTATION;  Surgeon: Marty Heck, MD;  Location: [facility];  Service: Vascular;  Laterality: Left;   BYPASS GRAFT FEMORAL-PERONEAL Left 05/04/2021   Procedure: LEFT COMMON FEMORAL-TIBIOPERONEAL TRUNK BYPASS;  Surgeon: Marty Heck, MD;  Location: [facility];  Service: Vascular;  Laterality: Left;   ENDARTERECTOMY FEMORAL Left 05/04/2021   Procedure: LEFT COMMON FEMORAL ENDARTERECTOMY WITH BOVINE PATCH;  Surgeon: Marty Heck, MD;  Location: [facility];  Service: Vascular;  Laterality: Left;   ENDARTERECTOMY TIBIOPERONEAL Left 05/04/2021   Procedure: LEFT BELOW KNEE POPITEAL WITH  TIBIOPERONEAL ENDARTERECTOMY;  Surgeon: Marty Heck, MD;  Location: [facility];  Service: Vascular;  Laterality: Left;   ENDOVEIN HARVEST OF GREATER SAPHENOUS VEIN  05/04/2021   Procedure: HARVEST OF LEFT GREATER SAPHENOUS VEIN;  Surgeon: Marty Heck, MD;  Location: [facility];  Service: Vascular;;   INSERTION OF ILIAC STENT Left 05/04/2021   Procedure: LEFT COMMON ILLIAC ATERY ANGIOPLASTY WITH STENT;  Surgeon: Marty Heck, MD;  Location: [facility];  Service: Vascular;  Laterality: Left;   INTRAOPERATIVE ARTERIOGRAM Left 05/04/2021   Procedure: LEFT ARTERY ARTERIOGRAM;  Surgeon: Marty Heck, MD;  Location: [facility];  Service: Vascular;  Laterality: Left;   IRRIGATION AND DEBRIDEMENT FOOT Left 05/04/2021   Procedure: IRRIGATION AND DEBRIDEMENT FOOT;  Surgeon: Marty Heck, MD;   Location: [facility];  Service: Vascular;  Laterality: Left;   KNEE SURGERY Left    NOSE SURGERY     TEE WITHOUT CARDIOVERSION N/A 05/01/2021   Procedure: TRANSESOPHAGEAL ECHOCARDIOGRAM (TEE);  Surgeon: Skeet Latch, MD;  Location: [facility];  Service: Cardiovascular;  Laterality: N/A;    Current Medications: Not taking any medications  Allergies:   Asa [aspirin], Penicillins, and Tetanus toxoid   Social History   Socioeconomic History   Marital status: Divorced    Spouse name: Not on file   Number of children: 2   Years of education: 11   Highest education level: 11th grade  Occupational History   Occupation: Disabled  Tobacco Use   Smoking status: Former    Packs/day: 1.00    Types: Cigarettes    Start date: 06/19/1966    Quit date: 06/20/2011    Years since quitting: 11.2    Passive exposure: Never   Smokeless tobacco: Never  Vaping Use   Vaping Use: Never used  Substance and Sexual Activity   Alcohol use: No    Comment: quit drinking in 1982   Drug use: Not Currently    Comment: marijuana and cocaine in the past, quit in 2012   Sexual activity: Not Currently  Other Topics Concern   Not on file  Social History Narrative   Lives alone - ground level apartment   Social Determinants of Health   Financial Resource Strain: [place]  (08/30/2022)   Overall Financial Resource Strain (CARDIA)    Difficulty of Paying Living Expenses: Not very hard  Food Insecurity: No Food Insecurity (08/30/2022)   Hunger Vital Sign    Worried About Running Out of Food in the Last Year: Never true    [place] in the Last Year: Never true  Transportation Needs: No Transportation Needs (08/30/2022)   PRAPARE - Hydrologist (Todd): No    Lack of Transportation (Non-Todd): No  Physical Activity: Inactive (08/30/2022)   Exercise Vital Sign    Days of Exercise per Week: 0 days    Minutes of Exercise per Session: 0 min  Stress: Stress Concern  Present (08/30/2022)   [place]    Feeling of Stress : To some extent  Social Connections: Moderately Integrated (08/30/2022)   Social Connection and Isolation Panel [NHANES]    Frequency of Communication with Friends and Family: Three times a week    Frequency of Social Gatherings with Friends and Family: More than three times a week    Attends Religious Services: More than 4 times per year    Active Member of Clubs or Organizations: Yes  Attends Music therapist: More than 4 times per year    Marital Status: Divorced     Family History: The patient's family history includes Cancer in his father; Heart disease in his mother; Stroke in his mother.  ROS:   Review of Systems  Constitutional:  Positive for weight loss. Negative for chills, diaphoresis, fever and malaise/fatigue.  HENT: Negative.    Eyes: Negative.   Respiratory: Negative.    Cardiovascular: Negative.   Gastrointestinal: Negative.   Genitourinary: Negative.   Musculoskeletal: Negative.   Skin: Negative.   Neurological:  Positive for dizziness and weakness. Negative for tingling, tremors, sensory change, speech change, focal weakness, seizures, loss of consciousness and headaches.  Endo/Heme/Allergies: Negative.   Psychiatric/Behavioral: Negative.       Please see the history of present illness.    All other systems reviewed and are negative.  EKGs/Labs/Other Studies Reviewed:    The following studies were reviewed today:   EKG:  EKG is ordered today.  The ekg ordered today demonstrates A-fib, 93 bpm, otherwise nothing acute.   Echocardiogram on 09/04/2022:  1. Left ventricular ejection fraction, by estimation, is 55 to 60%. The  left ventricle has normal function. The left ventricle has no regional  wall motion abnormalities. There is mild left ventricular hypertrophy.  Left ventricular diastolic function  could not be  evaluated due to atrial fibrillation.   2. Right ventricular systolic function was not well visualized. The right  ventricular size is normal.   3. Left atrial size was mildly dilated.   4. The mitral valve is grossly normal. No evidence of mitral valve  regurgitation. No evidence of mitral stenosis.   5. The aortic valve is abnormal. There is moderate calcification of the  aortic valve. Aortic valve regurgitation is not visualized. Mild aortic  valve stenosis.   6. The inferior vena cava is normal in size with greater than 50%  respiratory variability, suggesting right atrial pressure of 3 mmHg.   Comparison(s): Changes from prior study are noted. Stable mild AS.  TEE on 05/01/2021:  1. Left ventricular ejection fraction, by estimation, is 60 to 65%. The  left ventricle has normal function. The left ventricle has no regional  wall motion abnormalities.   2. Right ventricular systolic function is normal. The right ventricular  size is normal.   3. No left atrial/left atrial appendage thrombus was detected.   4. The mitral valve is normal in structure. Mild mitral valve  regurgitation. No evidence of mitral stenosis.   5. 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 Functionally  bicuspid. The aortic valve is calcified. There is moderate calcification  of the aortic valve. There is moderate thickening of the aortic valve.  Aortic valve regurgitation is not  visualized. Mild to moderate aortic valve sclerosis/calcification is  present, without any evidence of aortic stenosis. Aortic valve mean  gradient measures 8.0 mmHg. Aortic valve Vmax measures 1.96 m/s.   6. There is Moderate (Grade III) layered plaque involving the descending  aorta.   7. The inferior vena cava is normal in size with greater than 50%  respiratory variability, suggesting right atrial pressure of 3 mmHg.   Conclusion(s)/Recommendation(s): No evidence of vegetation/infective  endocarditis on this  transesophageal  echocardiogram.  2D Echo complete on 04/26/2021: 1. Left ventricular ejection fraction, by estimation, is 60 to 65%. The  left ventricle has normal function. The left ventricle has no regional  wall motion abnormalities. Left ventricular diastolic function could not  be evaluated.   2. Right ventricular systolic function is normal. The right ventricular  size is normal.   3. The mitral valve is normal in structure. No evidence of mitral valve  regurgitation. No evidence of mitral stenosis.   4. The aortic valve has an indeterminant number of cusps. Aortic valve  regurgitation is not visualized. Mild aortic valve stenosis.   5. The inferior vena cava is dilated in size with >50% respiratory  variability, suggesting right atrial pressure of 8 mmHg.  Recent Labs: 10/30/2021: TSH 2.310 08/03/2022: ALT 18; Hemoglobin 13.6; Platelets 317 08/10/2022: BUN 35; Creatinine, Ser 1.55; Potassium 4.4; Sodium 134  Recent Lipid Panel    Component Value Date/Time   CHOL 218 (H) 08/03/2022 1030   TRIG 204 (H) 08/03/2022 1030   HDL 44 08/03/2022 1030   CHOLHDL 5.0 08/03/2022 1030   CHOLHDL 5.4 05/04/2021 2353   VLDL 34 05/04/2021 2353   LDLCALC 137 (H) 08/03/2022 1030     Risk Assessment/Calculations:    CHA2DS2-VASc Score = 5  This indicates a 7.2% annual risk of stroke. The patient's score is based upon: CHF History: 0 HTN History: 1 Diabetes History: 1 Stroke History: 0 Vascular Disease History: 1 Age Score: 2 Gender Score: 0   Physical Exam:    VS:  BP 101/60 (BP Location: Left Arm, Patient Position: Sitting, Cuff Size: Normal)   Pulse 93   Ht 6' (1.829 m)   Wt 215 lb (97.5 kg)   SpO2 100%   BMI 29.16 kg/m     Wt Readings from Last 3 Encounters:  09/10/22 215 lb (97.5 kg)  08/27/22 215 lb 9.6 oz (97.8 kg)  08/17/22 222 lb 12.8 oz (101.1 kg)     GEN: 76 y.o. male in no acute distress HEENT: Normal NECK: No JVD; No carotid bruits CARDIAC: S1/S2,  irregular rhythm and regular rate, no murmurs, rubs, gallops; 2+ peripheral pulses throughout, strong and equal bilaterally RESPIRATORY:  Clear and diminished to auscultation without rales, wheezing or rhonchi  MUSCULOSKELETAL:  No edema; some broken fingers, otherwise no deformity  SKIN: Warm and dry NEUROLOGIC:  Alert and oriented x 3 PSYCHIATRIC:  Normal affect, distracted  ASSESSMENT:    1. Atrial fibrillation, unspecified type ([place])   2. H/O medication noncompliance   3. Medication management   4. Aortic valve stenosis, etiology of cardiac valve disease unspecified   5. Hypertension, unspecified type   6. PAD (peripheral artery disease) ([place])   7. Hyperlipidemia, unspecified hyperlipidemia type   8. Type 2 diabetes mellitus with diabetic neuropathy, with long-term current use of insulin ([place])   9. Frequent falls   10. Dizziness   11. Illiteracy   12. Housing instability, currently housed, at risk for homelessness   13. Weight loss    PLAN:    In order of problems listed above:  A-fib, medication non-compliance, medication management Denies palpitations or tachycarida, but pt is a difficult historian. EKG today reveals rate controlled A-fib. Hx of self-discontinuing medication. Continue Lopressor.  Xarelto previously discontinued due to history of frequent falls.  Not a Watchman device candidate due to frequent forgetfulness.  Reduce Lopressor to 25 mg BID d/t BP and dizziness (see below). Heart healthy diet and regular cardiovascular exercise encouraged.  Previously placed [facility] care management referral d/t health literacy.  Recommended patient go to [facility] to have pill packs rearranged for him.  He verbalized understanding.  Will recheck CBC.  2. Aortic valve stenosis TTE 08/2022 revealed mild aortic valve stenosis.  Asymptomatic. Plan to update TTE within the next year or sooner if needed. Heart healthy diet recommended.   3. HTN BP on arrival 98/62, repeat BP 101/60.  Brings in BP cuff. Demonstrated and instructed patient how to perform BP monitoring. Suspect low/soft BP is adding to frequent falls. Stopping lisinopril and reducing Lopressor as mentioned above. Placed a referral for [facility] at last office visit. Recommended he goes to local pharmacy to get his pill packets arranged, reflecting these changes. Heart healthy diet and regular cardiovascular exercise as tolerated encouraged. Will recheck BMET.   4. PAD Denies any claudication symptoms. Continue Plavix (cannot tolerate ASA).  Continue to follow-up with [facility] and establish care with PCP, will refer him to [facility] PCP. Continue current medication regimen. Heart healthy diet and regular cardiovascular exercise as tolerated encouraged.   5. HLD Most recent labs from November 2023 revealed total cholesterol 218, HDL 44, LDL 137, and triglycerides 204.  He is currently not at goal most likely due to medication noncompliance.  Continue current medication regimen. Heart healthy diet and regular cardiovascular exercise as tolerated encouraged.   6. T2DM Hemoglobin A1c in November 2023 was 8.5%.  Recommended he establish care with PCP, will make referral per his request. Heart healthy, diabetic diet and regular cardiovascular exercise encouraged.  PCP to manage.  7. Hx of frequent falls, dizziness Etiology multifactorial. No longer on Xarelto d/t hx of frequent falls. I previously offered to refer him to physical therapy to reduce his falls; however, he politely declined. Adjusting meds as mentioned above d/t low/soft BP. I discussed SNF to patient and he declines going to SNF for therapy or for long term stay. Plan to arrange orthostatics at next OV if dizziness does not resolve.   8. Illiteracy, Housing instability Cannot read or write. Does not have anyone nearby to help assist with his medications. Previously referred to [facility]. Will loop in LCSW with this case to see if there is any other  assistance to provide as he mentions about recent housing instability. As mentioned above, he declines SNF placement.    9. Weight loss Per our records, he is down 7 lbs from earlier this month. Most likely d/t poor appetite and vomiting per his report. Recommended follow up with PCP, will place referral to [facility]CP for him. Will check labs as mentioned above.   10. Disposition: Follow-up with me or Dr. Dellia Zhang in 2 weeks, and will route note to LCSW for assistance regarding patient's case.    Medication Adjustments/Labs and Tests Ordered: Current medicines are reviewed at length with the patient today.  Concerns regarding medicines are outlined above.  Orders Placed This Encounter  Procedures   CBC   Basic metabolic panel   Ambulatory Referral to Primary Care   EKG 12-Lead   Meds ordered this encounter  Medications   metoprolol tartrate (LOPRESSOR) 25 MG tablet    Sig: Take 1 tablet (25 mg total) by mouth 2 (two) times daily.    Dispense:  180 tablet    Refill:  3    09/10/22  decreased to 25 mg bid    Patient Instructions  Medication Instructions:  Stop Lisinopril   DECREASE Metoprolol to 25 mg twice a day   Labwork: CBC,BMET  Testing/Procedures: None today  Follow-Up: 09/24/22 with E.Berl Bonfanti,NP  Any Other Special Instructions Will Be Listed Below (If Applicable).  Referral placed to [facility]   If you need a refill on your cardiac medications before  your next appointment, please call your pharmacy.    Signed, Finis Bud, NP  09/13/2022 7:02 PM    [place]

## 2022-09-12 DIAGNOSIS — E1140 Type 2 diabetes mellitus with diabetic neuropathy, unspecified: Secondary | ICD-10-CM

## 2022-09-12 DIAGNOSIS — I10 Essential (primary) hypertension: Secondary | ICD-10-CM

## 2022-09-12 DIAGNOSIS — Z794 Long term (current) use of insulin: Secondary | ICD-10-CM

## 2022-09-13 ENCOUNTER — Encounter: Payer: Self-pay | Admitting: Nurse Practitioner

## 2022-09-15 ENCOUNTER — Telehealth: Payer: Self-pay | Admitting: *Deleted

## 2022-09-15 NOTE — Progress Notes (Signed)
  Care Coordination   Note   09/15/2022 Name: SHANNAN GARFINKEL MRN: [id number] DOB: [birth year]  TRISTON SKARE is a 77 y.o. year old male who sees Patient, [name] for primary care. I reached out to [name] by phone today to offer care coordination services.  Mr. Bollard was given information about Care Coordination services today including:   The Care Coordination services include support from the care team which includes your Nurse Coordinator, Clinical Social Worker, or Pharmacist.  The Care Coordination team is here to help remove barriers to the health concerns and goals most important to you. Care Coordination services are voluntary, and the patient may decline or stop services at any time by request to their care team member.   Care Coordination Consent Status: Patient agreed to services and verbal consent obtained.   Follow up plan:  Telephone appointment with care coordination team member scheduled for:  09/23/22  Encounter Outcome:  Pt. Scheduled  [place]  Direct Dial: [phone]

## 2022-09-21 ENCOUNTER — Ambulatory Visit: Payer: Self-pay | Admitting: *Deleted

## 2022-09-21 ENCOUNTER — Encounter: Payer: Self-pay | Admitting: *Deleted

## 2022-09-21 NOTE — Patient Instructions (Signed)
Visit Information  Thank you for taking time to visit with me today. Please don't hesitate to contact me if I can be of assistance to you.   Following are the goals we discussed today:   Goals Addressed               This Visit's Progress     Receive Assistance Obtaining Housing Resources. (pt-stated)   On track     Care Coordination Interventions:  Assessed Social Determinant of Health Barriers. Discussed Plans with Patient for Ongoing Care Management Follow Up. Provided Patient with Direct Contact Information for Care Management Team Members. Screened Patient for Signs & Symptoms of Depression Related to Chronic Disease State.  LYY5/KPT4 Depression Screen Completed & Results Reviewed with Patient.  Suicidal Ideation & Homicidal Ideation Assessed - None Present. Solution-Focused Strategies Employed.  Problem Solving Interventions Implemented. Task-Centered Solutions Developed.   Active Listening & Reflection Utilized.  Verbalization of Feelings Encouraged. Emotional Support Provided. Psychoeducation for Mental Health Concerns Initiated. Reviewed Prescription Medications & Discussed Importance of Compliance. Quality of Sleep Assessed & Sleep Hygiene Techniques Promoted. Participation in Counseling Encouraged. Discussed Referral to Psychiatrist for Psychotropic Medication Management. Discussed Referral to Therapist for Psychotherapeutic Counseling Services. Crisis Resource Information, Agencies & Resources Provided. Please Review the Following List of Housing Resources with Your Sister, [name], Mailed to Your Home on 09/21/2021 & Begin Contacting Resources of Interest At Your Earliest Convenience: ~ [employer] Section 8 Housing Information (Hold Off on Geneticist, molecular Until Further Notice) ~ Community education officer ~ [employer] in [place], [place] ~ Low-Income Housing in [place], [place] ~ [employer] ~ [employer] ~ Solicitor  ~ Section 8 Statistician ~ International aid/development worker in [place], [place] CSW Collaboration with Sister, [name] to Request Assistance with Review & Completion of Applications for Safe, Affordable, Permanent & Low-Income Housing. CSW Energy manager from [employer] [phone]), to [employer] About Section 8 Housing Application Submission & Obtained the Following Information: ~ Important Note #1: Currently Not Accepting Any Applications for Section 8 (Housing Choice Voucher Program) at The Present Time & Don't Expect to For the [facility] Future.  Presently, Only Psychologist, educational for [employer] at [employer], in [place], [place]. ~ Important Note #2:  Be Advised That Due to Demand, Wait Time to Obtain Housing May Be At [facility] 6 - 8 MONTHS or LONGER, Depending on The Size of Your Household. ~ Important Note #3:  You Do Not Need to Print, Mail or Personally Take Any Documents to The Office.  At This Stage, All Application Information Will Be Provided Online Only. Please Contact CSW Directly (# [phone]), If You Have Questions or Need Assistance with Application Completion & Submission.       Our next appointment is by telephone on 10/01/2022 at 1:30 pm.  Please call the care guide team at [phone] if you need to cancel or reschedule your appointment.   If you are experiencing a Mental Health or Behavioral Health Crisis or need someone to talk to, please call the Suicide and Crisis Lifeline: 988 call the [place] National Suicide Prevention Lifeline: 6392745910 or TTY: 217 785 5629 TTY (803) 371-4919) to talk to a trained counselor call 1-800-273-TALK (toll free, 24 hour hotline) go to [facility] [address], [place] [phone]) call the [facility] Crisis Line:  (618)573-7729 call 911  Patient verbalizes understanding of  instructions and care plan provided today and agrees to view in [place]. Active MyChart status and patient understanding of how to access instructions and care plan via MyChart confirmed with patient.     Telephone follow up appointment with care management team member scheduled for:  10/01/2022 at 1:30 pm.    Nat Christen, BSW, MSW, [place]  Licensed Clinical Social Worker  [place]  Mailing [place]. [address] [place], [place] [address] Physical Address-[address] [place], [place] [address] Toll Free Main # [phone] Fax # [phone] Cell # [phone] [email or web address]

## 2022-09-21 NOTE — Patient Outreach (Signed)
Care Coordination   Initial Visit Note   09/21/2022  Name: MILIND RAETHER MRN: [id number] DOB: [birth year]  MONTRAY KLIEBERT is a 77 y.o. year old male who sees Patient, [name] for primary care. I spoke with [name] by phone today.  What matters to the patients health and wellness today?  Receive Assistance Obtaining Housing Resources.    Goals Addressed               This Visit's Progress     Receive Assistance Obtaining Housing Resources. (pt-stated)   On track     Care Coordination Interventions:  Assessed Social Determinant of Health Barriers. Discussed Plans with Patient for Ongoing Care Management Follow Up. Provided Patient with Direct Contact Information for Care Management Team Members. Screened Patient for Signs & Symptoms of Depression Related to Chronic Disease State.  KWI0/XBD5 Depression Screen Completed & Results Reviewed with Patient.  Suicidal Ideation & Homicidal Ideation Assessed - None Present. Solution-Focused Strategies Employed.  Problem Solving Interventions Implemented. Task-Centered Solutions Developed.   Active Listening & Reflection Utilized.  Verbalization of Feelings Encouraged. Emotional Support Provided. Psychoeducation for Mental Health Concerns Initiated. Reviewed Prescription Medications & Discussed Importance of Compliance. Quality of Sleep Assessed & Sleep Hygiene Techniques Promoted. Participation in Counseling Encouraged. Discussed Referral to Psychiatrist for Psychotropic Medication Management. Discussed Referral to Therapist for Psychotherapeutic Counseling Services. Crisis Resource Information, Agencies & Resources Provided. Please Review the Following List of Housing Resources with Your Sister, Nonie Hoyer, Mailed to Your Home on 09/21/2021 & Begin Contacting Resources of Interest At Your Earliest Convenience: ~ [employer] (Hold Off on Nutritional therapist Until  Further Notice) ~ Doctor, general practice ~ [employer] in [place], [place] ~ [place] in [place], [place] ~ [place] ~ [employer] ~ Chief Strategy Officer  ~ [place] ~ Higher education careers adviser in [place], [place] with Sister, Nonie Hoyer to [place] with Review & Completion of Applications for Safe, Affordable, Center. CSW Environmental education officer from [employer] [phone]), to [place] the Following Information: ~ Important Note #1: Currently Not Accepting Any Applications for Section 8 (Housing Choice Voucher Program) at The Present Time & Don't Expect to For the [facility] Future.  Presently, Only Recruitment consultant for [employer] at [employer], in [place], [place]. ~ Important Note #2:  Be Advised That Due to Demand, Wait Time to [place] 6 - 8 MONTHS or LONGER, Depending on The Size of Your Household. ~ Important Note #3:  You Do Not Need to Print, Mail or Personally Take Any Documents to The Office.  At This Stage, All Application Information Will Be Provided Online Only. Please Contact CSW Directly (# [phone]), If You Have Questions or Need Assistance with Application Completion & Submission.         SDOH assessments and interventions completed:  Yes.  SDOH Interventions Today    Flowsheet Row Most Recent Value  SDOH Interventions   Food Insecurity Interventions Intervention Not Indicated  Housing Interventions Other (Comment)  [place] Application Provided.]  Transportation Interventions Intervention Not Indicated, Patient Resources (Friends/Family), Payor Benefit  Utilities Interventions Intervention Not Indicated  Alcohol Usage  Interventions Intervention Not Indicated (Score <7)  Financial Strain Interventions Intervention Not Indicated  Physical Activity Interventions Patient Refused  Stress Interventions Offered [employer], Provide Counseling, Patient Refused  Social Connections Interventions Intervention Not Indicated     Care Coordination Interventions:  Yes, provided.   Follow up plan: Follow up call scheduled for 10/01/2022 at 1:30 pm.  Encounter Outcome:  Pt. Visit Completed.   Danford Bad, BSW, MSW, LCSW  Licensed Restaurant manager, fast food [facility]  Mailing [place] [place], [place] [address] Physical Address-[address] [place], [place] [address] Toll Free Main # [phone] Fax # [phone] Cell # [phone] [email or web address]

## 2022-09-23 ENCOUNTER — Encounter: Payer: Self-pay | Admitting: *Deleted

## 2022-09-24 ENCOUNTER — Encounter: Payer: Self-pay | Admitting: Nurse Practitioner

## 2022-09-24 ENCOUNTER — Ambulatory Visit: Payer: Medicare HMO | Attending: Nurse Practitioner | Admitting: Nurse Practitioner

## 2022-09-24 VITALS — BP 122/84 | HR 87 | Ht 72.0 in | Wt 213.2 lb

## 2022-09-24 DIAGNOSIS — Z59811 Housing instability, housed, with risk of homelessness: Secondary | ICD-10-CM

## 2022-09-24 DIAGNOSIS — I10 Essential (primary) hypertension: Secondary | ICD-10-CM

## 2022-09-24 DIAGNOSIS — I350 Nonrheumatic aortic (valve) stenosis: Secondary | ICD-10-CM | POA: Diagnosis not present

## 2022-09-24 DIAGNOSIS — Z9181 History of falling: Secondary | ICD-10-CM

## 2022-09-24 DIAGNOSIS — Z91148 Patient's other noncompliance with medication regimen for other reason: Secondary | ICD-10-CM

## 2022-09-24 DIAGNOSIS — I4891 Unspecified atrial fibrillation: Secondary | ICD-10-CM

## 2022-09-24 DIAGNOSIS — E785 Hyperlipidemia, unspecified: Secondary | ICD-10-CM | POA: Diagnosis not present

## 2022-09-24 DIAGNOSIS — I739 Peripheral vascular disease, unspecified: Secondary | ICD-10-CM | POA: Diagnosis not present

## 2022-09-24 DIAGNOSIS — M7989 Other specified soft tissue disorders: Secondary | ICD-10-CM

## 2022-09-24 DIAGNOSIS — Z550 Illiteracy and low-level literacy: Secondary | ICD-10-CM | POA: Diagnosis not present

## 2022-09-24 DIAGNOSIS — Z794 Long term (current) use of insulin: Secondary | ICD-10-CM

## 2022-09-24 DIAGNOSIS — R634 Abnormal weight loss: Secondary | ICD-10-CM

## 2022-09-24 DIAGNOSIS — E1140 Type 2 diabetes mellitus with diabetic neuropathy, unspecified: Secondary | ICD-10-CM

## 2022-09-24 DIAGNOSIS — N1832 Chronic kidney disease, stage 3b: Secondary | ICD-10-CM

## 2022-09-24 NOTE — Progress Notes (Unsigned)
Cardiology Office Note:    Date: 09/24/2022  ID:  AYYAN SITES, DOB 03/21/1946, MRN [id number]  PCP:  Patient, [name]   [facility] Providers Cardiologist:  Marjo Bicker, MD     Referring MD: No ref. provider found   CC: Here for follow-up  History of Present Illness:    KAIO KUHLMAN is a 77 y.o. male with a hx of the following:  Paroxysmal A-fib PAD Hyperlipidemia Hypertension Type 2 diabetes CKD  Previously was a patient of Dr. Abelardo Diesel of [facility] health cardiology.  Past medical cardiovascular history includes paroxysmal A-fib and underwent cardioversion in 2016.  History of PAD manifested by CLI and had ulceration and is status post left CFA endarterectomy, L below-knee popliteal with TP endarterectomy, left CFA-TP trunk bypass, and left great toe amputation in 2022.  Follows vascular surgery.  Was referred to cardiology services for evaluation of A-fib at the request of his PCP.  Establish care with Dr. Jenene Slicker on July 28, 2022.  At office visit that day he noted palpitations for nearly 1 year, had associated symptom of worsening dyspnea on exertion.  EKG in office that day revealed A-fib with RVR, heart rate 111.  Noted dull chest discomfort, lasting an hour or so, random in occurrence and resolving spontaneously, typically occurring about once per month.  Denied any family history of cardiovascular disease, and also denied any other cardiac complaints or concerns.  Blood pressure was soft in office that day, Dr. Jenene Slicker recommended to go to ER for evaluation; however patient refused at this time.  Was started on metoprolol tartrate 25 mg twice daily and Xarelto 15 mg daily was continued.  Lasix was increased from 20 mg daily to 40 mg twice daily for dyspnea on exertion.  Was scheduled for TEE with cardioversion in 2 weeks.  Also started on Zetia 10 mg daily for his history of hyperlipidemia.  Benazepril was switched to lisinopril 2.5 mg once  daily.   While presenting for elective TEE with guided cardioversion, he stated he stopped taking all of his medicines around a week to 2 weeks prior and stated he felt better.  It was found he was in A-fib heart rate ranging from 90-100s.  His insurance had changed at that time it would not be in complete effect until the next day, therefore his TEE guided with cardioversion was canceled and scheduled for outpatient follow-up visit the following week to start low-dose metoprolol and Xarelto.  He noted at that time that he cannot read or write and prefer not to come to [facility] for any procedures unless absolutely necessary.  Dr. Jenene Slicker stated he will need a case management referral to reach out to insurance company to send prescriptions in pillboxes.  Echocardiogram scheduled for a few weeks after.  I saw this patient for follow-up evaluation for A-fib on 08/17/2022. Pt was overall doing well and stated he felt better after stopping all of his medications. He was not taking any medications at the time. Illiterate and only able to identify pills via color, texture, and size.  He was not monitoring his blood pressure, didn't know how to use.  Denied any chest pain, shortness of breath, syncope, presyncope, dizziness, orthopnea, PND, swelling, significant weight changes, or claudication. Did admit to what sounds like occasional palpitations. Noted hx of frequent falls. Dr. Jenene Slicker was consulted regarding this patient.  Was restarted on Lopressor 25 mg twice daily, Xarelto 15 mg daily, Plavix 75 mg daily.  Referred patient to case management.  He was also restarted on Lipitor, Zetia, Tricor.  I also consulted clinical social worker, Octavio Graves, regarding this patient. I was hesitant to restart his blood thinner due to this, but Dr. Jenene Slicker recommended to restart patient on anticoagulation.  He saw Dr. Jenene Slicker in office on 08/27/2022.  He presented to [facility] the day before and was  prescribed ciprofloxacin for diverticulitis for colitis infection.  He continued to note dyspnea on exertion, stable.  Denied chest pain or syncope.  He continued to note falls, had 3 falls in the last month and hit his head.  Stated he started to have falls a few months ago due to neuropathy in his legs.  Patient also noted forgetfulness.  He stated he had no family support, no one to help him with his medical problems.  Xarelto was stopped at this office visit due to increased risk for falls.  He hit his head on the ground previously with no indication of ICH.  Even though he met criteria for watchman implantation, Dr. Jenene Slicker stated he would not likely not benefit due to frequent forgetfulness.  Was told to follow-up in 2 weeks.  Was told to follow-up at [facility] pharmacy drugstore to review his pillbox and medications.  Patient cannot read and write.  He presented to [facility] on 09/03/2022 after sustaining injuries to knee and left rib over a month ago. Did have rib fracture with previous fall. He presented for evaluation. Radiologic films revealed no new rib abnormalities and no acute abnormality of knees. Was given a Rx of Tramadol. Was d/c in stable condition.   He presents today for 2-week follow-up.  He states he is doing the same as last visit. Denies any chest pain, shortness of breath, palpitations, syncope, presyncope, orthopnea, PND, swelling, significant weight changes, acute bleeding, or claudication.  Does admit to occasional feelings of dizziness.  States he wakes up some mornings feeling sick, says he throws up every morning.  Poor appetite.  Stateshe needs to move and needs to find a place to stay, says he will be homeless and probably will have to live in his truck when he leaves his house. Denies any other questions or concerns.   Past Medical History:  Diagnosis Date   Atrial fibrillation (HCC)    Cervical spinal stenosis    DDD (degenerative disc disease), cervical    DDD  (degenerative disc disease), lumbar    Diabetes mellitus without complication (HCC)    Dyspnea    Hyperlipidemia    Hypertension    Peripheral artery disease (HCC)     Past Surgical History:  Procedure Laterality Date   ABDOMINAL AORTOGRAM W/LOWER EXTREMITY N/A 04/30/2021   Procedure: ABDOMINAL AORTOGRAM W/LOWER EXTREMITY;  Surgeon: Leonie Douglas, MD;  Location: [facility];  Service: Cardiovascular;  Laterality: N/A;   AMPUTATION TOE     AMPUTATION TOE Left 05/04/2021   Procedure: LEFT GREAT TOE AMPUTATION;  Surgeon: [name], MD;  Location: [facility] OR;  Service: Vascular;  Laterality: Left;   BYPASS GRAFT FEMORAL-PERONEAL Left 05/04/2021   Procedure: LEFT COMMON FEMORAL-TIBIOPERONEAL TRUNK BYPASS;  Surgeon: [name], MD;  Location: [facility] OR;  Service: Vascular;  Laterality: Left;   ENDARTERECTOMY FEMORAL Left 05/04/2021   Procedure: LEFT COMMON FEMORAL ENDARTERECTOMY WITH BOVINE PATCH;  Surgeon: [name], MD;  Location: [facility] OR;  Service: Vascular;  Laterality: Left;   ENDARTERECTOMY TIBIOPERONEAL Left 05/04/2021   Procedure: LEFT BELOW KNEE POPITEAL  WITH  TIBIOPERONEAL ENDARTERECTOMY;  Surgeon: Marty Heck, MD;  Location: [place];  Service: Vascular;  Laterality: Left;   ENDOVEIN HARVEST OF GREATER SAPHENOUS VEIN  05/04/2021   Procedure: HARVEST OF LEFT GREATER SAPHENOUS VEIN;  Surgeon: Marty Heck, MD;  Location: [place];  Service: Vascular;;   INSERTION OF ILIAC STENT Left 05/04/2021   Procedure: LEFT COMMON ILLIAC ATERY ANGIOPLASTY WITH STENT;  Surgeon: Marty Heck, MD;  Location: [place];  Service: Vascular;  Laterality: Left;   INTRAOPERATIVE ARTERIOGRAM Left 05/04/2021   Procedure: LEFT ARTERY ARTERIOGRAM;  Surgeon: Marty Heck, MD;  Location: [place];  Service: Vascular;  Laterality: Left;   IRRIGATION AND DEBRIDEMENT FOOT Left 05/04/2021   Procedure: IRRIGATION AND DEBRIDEMENT FOOT;  Surgeon: Marty Heck, MD;   Location: [place];  Service: Vascular;  Laterality: Left;   KNEE SURGERY Left    NOSE SURGERY     TEE WITHOUT CARDIOVERSION N/A 05/01/2021   Procedure: TRANSESOPHAGEAL ECHOCARDIOGRAM (TEE);  Surgeon: Skeet Latch, MD;  Location: [place];  Service: Cardiovascular;  Laterality: N/A;    Current Medications: Not taking any medications  Allergies:   Asa [aspirin], Penicillins, and Tetanus toxoid   Social History   Socioeconomic History   Marital status: Divorced    Spouse name: Not on file   Number of children: 2   Years of education: 11   Highest education level: 11th grade  Occupational History   Occupation: Disabled  Tobacco Use   Smoking status: Former    Packs/day: 1.00    Types: Cigarettes    Start date: 06/19/1966    Quit date: 06/20/2011    Years since quitting: 11.2    Passive exposure: Past   Smokeless tobacco: Never  Vaping Use   Vaping Use: Never used  Substance and Sexual Activity   Alcohol use: No    Comment: quit drinking in 1982   Drug use: Not Currently    Comment: marijuana and cocaine in the past, quit in 2012   Sexual activity: Not Currently  Other Topics Concern   Not on file  Social History Narrative   Lives alone - ground level apartment   Social Determinants of Health   Financial Resource Strain: [place]  (09/21/2022)   Overall Financial Resource Strain (CARDIA)    Difficulty of Paying Living Expenses: Not very hard  Food Insecurity: No Food Insecurity (09/21/2022)   Hunger Vital Sign    Worried About Running Out of Food in the Last Year: Never true    [place] in the Last Year: Never true  Transportation Needs: No Transportation Needs (09/21/2022)   PRAPARE - Hydrologist (Medical): No    Lack of Transportation (Non-Medical): No  Physical Activity: Inactive (09/21/2022)   Exercise Vital Sign    Days of Exercise per Week: 0 days    Minutes of Exercise per Session: 0 min  Stress: Stress Concern Present  (09/21/2022)   [place]    Feeling of Stress : To some extent  Social Connections: Moderately Integrated (09/21/2022)   Social Connection and Isolation Panel [NHANES]    Frequency of Communication with Friends and Family: More than three times a week    Frequency of Social Gatherings with Friends and Family: More than three times a week    Attends Religious Services: More than 4 times per year    Active Member of [employer] or Organizations:  Yes    Attends Banker Meetings: More than 4 times per year    Marital Status: Divorced     Family History: The patient's family history includes Cancer in his father; Heart disease in his mother; Stroke in his mother.  ROS:   Review of Systems  Constitutional:  Positive for weight loss. Negative for chills, diaphoresis, fever and malaise/fatigue.  HENT: Negative.    Eyes: Negative.   Respiratory: Negative.    Cardiovascular: Negative.   Gastrointestinal: Negative.   Genitourinary: Negative.   Musculoskeletal: Negative.   Skin: Negative.   Neurological:  Positive for dizziness and weakness. Negative for tingling, tremors, sensory change, speech change, focal weakness, seizures, loss of consciousness and headaches.  Endo/Heme/Allergies: Negative.   Psychiatric/Behavioral: Negative.       Please see the history of present illness.    All other systems reviewed and are negative.  EKGs/Labs/Other Studies Reviewed:    The following studies were reviewed today:   EKG:  EKG is ordered today.  The ekg ordered today demonstrates A-fib, 87 bpm, nonspecific st segment, artifact, consider echo... ***  Echocardiogram on 09/04/2022:  1. Left ventricular ejection fraction, by estimation, is 55 to 60%. The  left ventricle has normal function. The left ventricle has no regional  wall motion abnormalities. There is mild left ventricular hypertrophy.  Left ventricular diastolic  function  could not be evaluated due to atrial fibrillation.   2. Right ventricular systolic function was not well visualized. The right  ventricular size is normal.   3. Left atrial size was mildly dilated.   4. The mitral valve is grossly normal. No evidence of mitral valve  regurgitation. No evidence of mitral stenosis.   5. The aortic valve is abnormal. There is moderate calcification of the  aortic valve. Aortic valve regurgitation is not visualized. Mild aortic  valve stenosis.   6. The inferior vena cava is normal in size with greater than 50%  respiratory variability, suggesting right atrial pressure of 3 mmHg.   Comparison(s): Changes from prior study are noted. Stable mild AS.  TEE on 05/01/2021:  1. Left ventricular ejection fraction, by estimation, is 60 to 65%. The  left ventricle has normal function. The left ventricle has no regional  wall motion abnormalities.   2. Right ventricular systolic function is normal. The right ventricular  size is normal.   3. No left atrial/left atrial appendage thrombus was detected.   4. The mitral valve is normal in structure. Mild mitral valve  regurgitation. No evidence of mitral stenosis.   5. 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 Functionally  bicuspid. The aortic valve is calcified. There is moderate calcification  of the aortic valve. There is moderate thickening of the aortic valve.  Aortic valve regurgitation is not  visualized. Mild to moderate aortic valve sclerosis/calcification is  present, without any evidence of aortic stenosis. Aortic valve mean  gradient measures 8.0 mmHg. Aortic valve Vmax measures 1.96 m/s.   6. There is Moderate (Grade III) layered plaque involving the descending  aorta.   7. The inferior vena cava is normal in size with greater than 50%  respiratory variability, suggesting right atrial pressure of 3 mmHg.   Conclusion(s)/Recommendation(s): No evidence of vegetation/infective   endocarditis on this transesophageal  echocardiogram.  2D Echo complete on 04/26/2021: 1. Left ventricular ejection fraction, by estimation, is 60 to 65%. The  left ventricle has normal function. The left ventricle has no regional  wall motion abnormalities.  Left ventricular diastolic function could not  be evaluated.   2. Right ventricular systolic function is normal. The right ventricular  size is normal.   3. The mitral valve is normal in structure. No evidence of mitral valve  regurgitation. No evidence of mitral stenosis.   4. The aortic valve has an indeterminant number of cusps. Aortic valve  regurgitation is not visualized. Mild aortic valve stenosis.   5. The inferior vena cava is dilated in size with >50% respiratory  variability, suggesting right atrial pressure of 8 mmHg.  Recent Labs: 10/30/2021: TSH 2.310 08/03/2022: ALT 18; Hemoglobin 13.6; Platelets 317 08/10/2022: BUN 35; Creatinine, Ser 1.55; Potassium 4.4; Sodium 134  Recent Lipid Panel    Component Value Date/Time   CHOL 218 (H) 08/03/2022 1030   TRIG 204 (H) 08/03/2022 1030   HDL 44 08/03/2022 1030   CHOLHDL 5.0 08/03/2022 1030   CHOLHDL 5.4 05/04/2021 2353   VLDL 34 05/04/2021 2353   LDLCALC 137 (H) 08/03/2022 1030     Risk Assessment/Calculations:    CHA2DS2-VASc Score = 5  This indicates a 7.2% annual risk of stroke. The patient's score is based upon: CHF History: 0 HTN History: 1 Diabetes History: 1 Stroke History: 0 Vascular Disease History: 1 Age Score: 2 Gender Score: 0   Physical Exam:    VS:  There were no vitals taken for this visit.    Wt Readings from Last 3 Encounters:  09/10/22 215 lb (97.5 kg)  08/27/22 215 lb 9.6 oz (97.8 kg)  08/17/22 222 lb 12.8 oz (101.1 kg)     GEN: 77 y.o. male in no acute distress HEENT: Normal NECK: No JVD; No carotid bruits CARDIAC: S1/S2, irregular rhythm and regular rate, no murmurs, rubs, gallops; 2+ peripheral pulses throughout, strong and  equal bilaterally RESPIRATORY:  Clear and diminished to auscultation without rales, wheezing or rhonchi  MUSCULOSKELETAL:  No edema; some broken fingers, otherwise no deformity  SKIN: Warm and dry NEUROLOGIC:  Alert and oriented x 3 PSYCHIATRIC:  Normal affect, distracted  ASSESSMENT:    No diagnosis found.  PLAN:    In order of problems listed above:  A-fib, medication non-compliance, medication management Denies palpitations or tachycarida, but pt is a difficult historian. EKG today reveals rate controlled A-fib. Hx of self-discontinuing medication. Continue Lopressor.  Xarelto previously discontinued due to history of frequent falls.  Not a Watchman device candidate due to frequent forgetfulness.  Reduce Lopressor to 25 mg BID d/t BP and dizziness (see below). Heart healthy diet and regular cardiovascular exercise encouraged.  Previously placed [facility] care management referral d/t health literacy.  Recommended patient go to [facility] to have pill packs rearranged for him.  He verbalized understanding.  Will recheck CBC.  2. Aortic valve stenosis TTE 08/2022 revealed mild aortic valve stenosis. Asymptomatic. Plan to update TTE within the next year or sooner if needed. Heart healthy diet recommended.   3. HTN BP on arrival 98/62, repeat BP 101/60. Brings in BP cuff. Demonstrated and instructed patient how to perform BP monitoring. Suspect low/soft BP is adding to frequent falls. Stopping lisinopril and reducing Lopressor as mentioned above. Placed a referral for [facility] at last office visit. Recommended he goes to local pharmacy to get his pill packets arranged, reflecting these changes. Heart healthy diet and regular cardiovascular exercise as tolerated encouraged. Will recheck BMET.   4. PAD Denies any claudication symptoms. Continue Plavix (cannot tolerate ASA).  Continue to follow-up with [facility] and establish care with  PCP, will refer him to new PCP. Continue current medication  regimen. Heart healthy diet and regular cardiovascular exercise as tolerated encouraged.   5. HLD Most recent labs from November 2023 revealed total cholesterol 218, HDL 44, LDL 137, and triglycerides 204.  He is currently not at goal most likely due to medication noncompliance.  Continue current medication regimen. Heart healthy diet and regular cardiovascular exercise as tolerated encouraged.   6. T2DM Hemoglobin A1c in November 2023 was 8.5%.  Recommended he establish care with PCP, will make referral per his request. Heart healthy, diabetic diet and regular cardiovascular exercise encouraged.  PCP to manage.  7. Hx of frequent falls, dizziness Etiology multifactorial. No longer on Xarelto d/t hx of frequent falls. I previously offered to refer him to physical therapy to reduce his falls; however, he politely declined. Adjusting meds as mentioned above d/t low/soft BP. I discussed SNF to patient and he declines going to SNF for therapy or for long term stay. Plan to arrange orthostatics at next OV if dizziness does not resolve.   8. Illiteracy, Housing instability Cannot read or write. Does not have anyone nearby to help assist with his medications. Previously referred to [facility] Care Management. Will loop in LCSW with this case to see if there is any other assistance to provide as he mentions about recent housing instability. As mentioned above, he declines SNF placement.    9. Weight loss Per our records, he is down 7 lbs from earlier this month. Most likely d/t poor appetite and vomiting per his report. Recommended follow up with PCP, will place referral to new PCP for him. Will check labs as mentioned above.   10. Disposition: Follow-up with me or Dr. Jenene Slicker in 2 weeks, and will route note to LCSW for assistance regarding patient's case.     Hasn't fall lately, near falls. Taking things slow. No chest pain. Generalized pains. Leg is swelling all the time. Left side. (Doppler).   No  appetite, sleep all the time. Feel wore out. No energy. Losing another toe?  Moving out. No where to go. Been busy.      Got move    Office  call and make otday.  Within next week.   SW and [facility] are following.      Glorial zarwolo fnp.       Medication Adjustments/Labs and Tests Ordered: Current medicines are reviewed at length with the patient today.  Concerns regarding medicines are outlined above.  No orders of the defined types were placed in this encounter.  No orders of the defined types were placed in this encounter.   There are no Patient Instructions on file for this visit.    SignedSharlene Dory, NP  09/24/2022 7:42 AM    [facility]

## 2022-09-24 NOTE — Patient Instructions (Addendum)
Medication Instructions:  Your physician recommends that you continue on your current medications as directed. Please refer to the Current Medication list given to you today.  Labwork: None  Testing/Procedures: None  Follow-Up: Keep scheduled follow up with Dr. Dellia Cloud on October 04, 2022.   Any Other Special Instructions Will Be Listed Below (If Applicable).  You have been scheduled with [facility] on 10/27/2022 @ 3:20 pm.   [facility] [address] [place] [address] [phone]   If you need a refill on your cardiac medications before your next appointment, please call your pharmacy.

## 2022-09-24 NOTE — Progress Notes (Signed)
  Chronic Care Management   Note  09/24/2022 Name: JOVONTAE BANKO MRN: [id number] DOB: Oct 30, 1945  TROYCE GIESKE is a 76 y.o. year old male who is a primary care patient of Patient, [name]. I reached out to [name] by phone today in response to a referral sent by Mr. FLOYD WADE PCP.  Mr. Luiz was given information about Chronic Care Management services today including:  CCM service includes personalized support from designated clinical staff supervised by the physician, including individualized plan of care and coordination with other care providers 24/7 contact phone numbers for assistance for urgent and routine care needs. Service will only be billed when office clinical staff spend 20 minutes or more in a month to coordinate care. Only one practitioner may furnish and bill the service in a calendar month. The patient may stop CCM services at amy time (effective at the end of the month) by phone call to the office staff. The patient will be responsible for cost sharing (co-pay) or up to 20% of the service fee (after annual deductible is met)  Mr. PAXTEN APPELT  declinedto scheduling an appointment with the CCM Pharmacist   Follow up plan: Patient did not agree to scheduling an appointment with the Pharmacist. The ordering provider has been notified.

## 2022-09-27 ENCOUNTER — Ambulatory Visit: Payer: Medicare Other | Admitting: *Deleted

## 2022-09-27 DIAGNOSIS — E1140 Type 2 diabetes mellitus with diabetic neuropathy, unspecified: Secondary | ICD-10-CM

## 2022-09-27 DIAGNOSIS — I10 Essential (primary) hypertension: Secondary | ICD-10-CM

## 2022-09-27 NOTE — Chronic Care Management (AMB) (Signed)
Chronic Care Management   CCM RN Visit Note  09/27/2022 Name: Todd Zhang MRN: [id number] DOB: [birth year]  Subjective: Todd Zhang is a 77 y.o. year old male who is a primary care patient of Patient, [name]. The patient was referred to the Chronic Care Management team for assistance with care management needs subsequent to provider initiation of CCM services and plan of care.    Today's Visit:  Engaged with patient by telephone for follow up visit.        Goals Addressed             This Visit's Progress    CCM Expected Outcome:  Monitor, Self-Manage and Reduce Symptoms of Diabetes       Current Barriers:  Knowledge Deficits related to the range blood sugars should be in. The patient states he feels his is good as long as it is under "400" Care Coordination needs related to resources in the community for help with finding new housing and other resources to help the patient meet his needs in a patient with DM and other chronic conditions Chronic Disease Management support and education needs related to effective management of DM High fall risk due to missing toes on bilateral feet and also stating he has frequent falls. Uses a cane at times. Review of safety and falls prevention, pt states no falls since last outreach. Patient reports fasting CBG is in 100's range, at lunch 100's range, in the evening 280-290's range and hs 100's range, continues checking CBG 3-4 x per day LCSW continues working with patient on finding new housing Lab Results  Component Value Date   HGBA1C 8.5 (H) 08/03/2022     Planned Interventions: Reinforced education to patient about basic DM disease process. The patient has literacy concerns. Reviewed importance of taking medications as prescribed     Reinforced prescribed diet with patient heart healthy/ADA diet.  Counseled on importance of regular laboratory monitoring as prescribed;        Discussed plans with patient for ongoing care management  follow up and provided patient with direct contact information for care management team;        Reviewed scheduled/upcoming provider appointments including: 10/01/22 with LCSW         Call provider for findings outside established parameters;       Review of patient status, including review of consultants reports, relevant laboratory and other test results, and medications completed;       Advised patient to discuss changes in his DM, questions or concerns with provider;     Reviewed safety precautions       Symptom Management: Take medications as prescribed   Attend all scheduled provider appointments Call pharmacy for medication refills 3-7 days in advance of running out of medications Perform all self care activities independently  Perform IADL's (shopping, preparing meals, housekeeping, managing finances) independently Call provider office for new concerns or questions  Work with the social worker to address care coordination needs and will continue to work with the clinical team to address health care and disease management related needs check blood sugar at prescribed times: per doctor's order  check feet daily for cuts, sores or redness enter blood sugar readings and medication or insulin into daily log take the blood sugar log to all doctor visits take the blood sugar meter to all doctor visits trim toenails straight across fill half of plate with vegetables manage portion size wash and dry feet carefully every day wear  comfortable, cotton socks wear comfortable, well-fitting shoes fall prevention strategies: change position slowly, use assistive device such as walker or cane (per provider recommendations) when walking, keep walkways clear, have good lighting in room. It is important to contact your provider if you have any falls, maintain muscle strength/tone by exercise per provider recommendations.  Follow Up Plan: Telephone follow up appointment with care management team member  scheduled for:  11/05/22 at 215 pm       CCM Expected Outcome:  Monitor, Self-Manage, and Reduce Symptoms of Hypertension       Current Barriers:  Knowledge Deficits related to the need to manage blood pressure and follow the plan of care for effective management of HTN Care Coordination needs related to resources and expressed housing needs in a patient with HTN and other chronic conditions Chronic Disease Management support and education needs related to effective management of HTN Patient reports he has new blood pressure cuff and is monitoring blood pressure every other day and states " so far, been good"  Planned Interventions: Evaluation of current treatment plan related to hypertension self management and patient's adherence to plan as established by provider;   Reviewed prescribed diet low sodium Reviewed medications with patient and discussed importance of compliance;  Discussed plans with patient for ongoing care management follow up and provided patient with direct contact information for care management team; Advised patient, providing education and rationale, to monitor blood pressure daily and record, calling PCP for findings outside established parameters;  Reviewed scheduled/upcoming provider appointments including:  Advised patient to discuss any issues with blood pressure, medications with provider; Provided education on prescribed diet low sodium;  Discussed complications of poorly controlled blood pressure such as heart disease, stroke, circulatory complications, vision complications, kidney impairment, sexual dysfunction;   Symptom Management: Take medications as prescribed   Attend all scheduled provider appointments Call pharmacy for medication refills 3-7 days in advance of running out of medications Perform all self care activities independently  Perform IADL's (shopping, preparing meals, housekeeping, managing finances) independently Call provider office for new  concerns or questions  check blood pressure weekly write blood pressure results in a log or diary learn about high blood pressure keep a blood pressure log take blood pressure log to all doctor appointments develop an action plan for high blood pressure keep all doctor appointments take medications for blood pressure exactly as prescribed report new symptoms to your doctor eat more whole grains, fruits and vegetables, lean meats and healthy fats Continue checking blood pressure, keep a log,  keep up the good work!  Follow Up Plan: Telephone follow up appointment with care management team member scheduled for: 11/05/22 at 215 pm          Plan:Telephone follow up appointment with care management team member scheduled for:  11/05/22 at 215 pm  Jacqlyn Larsen [facility], BSN RN Case Manager [place] [phone]

## 2022-09-27 NOTE — Patient Instructions (Signed)
Please call the care guide team at [phone] if you need to cancel or reschedule your appointment.   If you are experiencing a Mental Health or [place] or need someone to talk to, please call the Suicide and Crisis Lifeline: 988 call the [place] National Suicide Prevention Lifeline: 424 080 4119 or TTY: (979)122-2316 TTY (973) 657-5198) to talk to a trained counselor call 1-800-273-TALK (toll free, 24 hour hotline) go to [facility] [address], [place] [phone]) call the [facility]: [phone] call 911   Following is a copy of the CCM Program Consent:  CCM service includes personalized support from designated clinical staff supervised by the physician, including individualized plan of care and coordination with other care providers 24/7 contact phone numbers for assistance for urgent and routine care needs. Service will only be billed when office clinical staff spend 20 minutes or more in a month to coordinate care. Only one practitioner may furnish and bill the service in a calendar month. The patient may stop CCM services at amy time (effective at the end of the month) by phone call to the office staff. The patient will be responsible for cost sharing (co-pay) or up to 20% of the service fee (after annual deductible is met)  Following is a copy of your full provider care plan:   Goals Addressed             This Visit's Progress    CCM Expected Outcome:  Monitor, Self-Manage and Reduce Symptoms of Diabetes       Current Barriers:  Knowledge Deficits related to the range blood sugars should be in. The patient states he feels his is good as long as it is under "400" Care Coordination needs related to resources in the community for help with finding new housing and other resources to help the patient meet his needs in a patient with DM and other chronic conditions Chronic Disease Management support and  education needs related to effective management of DM High fall risk due to missing toes on bilateral feet and also stating he has frequent falls. Uses a cane at times. Review of safety and falls prevention, pt states no falls since last outreach. Patient reports fasting CBG is in 100's range, at lunch 100's range, in the evening 280-290's range and hs 100's range, continues checking CBG 3-4 x per day LCSW continues working with patient on finding new housing Lab Results  Component Value Date   HGBA1C 8.5 (H) 08/03/2022     Planned Interventions: Reinforced education to patient about basic DM disease process. The patient has literacy concerns. Reviewed importance of taking medications as prescribed     Reinforced prescribed diet with patient heart healthy/ADA diet.  Counseled on importance of regular laboratory monitoring as prescribed;        Discussed plans with patient for ongoing care management follow up and provided patient with direct contact information for care management team;        Reviewed scheduled/upcoming provider appointments including: 10/01/22 with LCSW         Call provider for findings outside established parameters;       Review of patient status, including review of consultants reports, relevant laboratory and other test results, and medications completed;       Advised patient to discuss changes in his DM, questions or concerns with provider;     Reviewed safety precautions       Symptom Management: Take medications as prescribed  Attend all scheduled provider appointments Call pharmacy for medication refills 3-7 days in advance of running out of medications Perform all self care activities independently  Perform IADL's (shopping, preparing meals, housekeeping, managing finances) independently Call provider office for new concerns or questions  Work with the social worker to address care coordination needs and will continue to work with the clinical team to address  health care and disease management related needs check blood sugar at prescribed times: per doctor's order  check feet daily for cuts, sores or redness enter blood sugar readings and medication or insulin into daily log take the blood sugar log to all doctor visits take the blood sugar meter to all doctor visits trim toenails straight across fill half of plate with vegetables manage portion size wash and dry feet carefully every day wear comfortable, cotton socks wear comfortable, well-fitting shoes fall prevention strategies: change position slowly, use assistive device such as walker or cane (per provider recommendations) when walking, keep walkways clear, have good lighting in room. It is important to contact your provider if you have any falls, maintain muscle strength/tone by exercise per provider recommendations.  Follow Up Plan: Telephone follow up appointment with care management team member scheduled for:  11/05/22 at 215 pm       CCM Expected Outcome:  Monitor, Self-Manage, and Reduce Symptoms of Hypertension       Current Barriers:  Knowledge Deficits related to the need to manage blood pressure and follow the plan of care for effective management of HTN Care Coordination needs related to resources and expressed housing needs in a patient with HTN and other chronic conditions Chronic Disease Management support and education needs related to effective management of HTN Patient reports he has new blood pressure cuff and is monitoring blood pressure every other day and states " so far, been good"  Planned Interventions: Evaluation of current treatment plan related to hypertension self management and patient's adherence to plan as established by provider;   Reviewed prescribed diet low sodium Reviewed medications with patient and discussed importance of compliance;  Discussed plans with patient for ongoing care management follow up and provided patient with direct contact  information for care management team; Advised patient, providing education and rationale, to monitor blood pressure daily and record, calling PCP for findings outside established parameters;  Reviewed scheduled/upcoming provider appointments including:  Advised patient to discuss any issues with blood pressure, medications with provider; Provided education on prescribed diet low sodium;  Discussed complications of poorly controlled blood pressure such as heart disease, stroke, circulatory complications, vision complications, kidney impairment, sexual dysfunction;   Symptom Management: Take medications as prescribed   Attend all scheduled provider appointments Call pharmacy for medication refills 3-7 days in advance of running out of medications Perform all self care activities independently  Perform IADL's (shopping, preparing meals, housekeeping, managing finances) independently Call provider office for new concerns or questions  check blood pressure weekly write blood pressure results in a log or diary learn about high blood pressure keep a blood pressure log take blood pressure log to all doctor appointments develop an action plan for high blood pressure keep all doctor appointments take medications for blood pressure exactly as prescribed report new symptoms to your doctor eat more whole grains, fruits and vegetables, lean meats and healthy fats Continue checking blood pressure, keep a log,  keep up the good work!  Follow Up Plan: Telephone follow up appointment with care management team member scheduled for: 11/05/22 at 215 pm  The patient verbalized understanding of instructions, educational materials, and care plan provided today and DECLINED offer to receive copy of patient instructions, educational materials, and care plan.   Telephone follow up appointment with care management team member scheduled for:   11/05/22 at 215 pm

## 2022-09-28 ENCOUNTER — Other Ambulatory Visit: Payer: Self-pay | Admitting: Internal Medicine

## 2022-10-01 ENCOUNTER — Ambulatory Visit: Payer: Self-pay | Admitting: *Deleted

## 2022-10-01 ENCOUNTER — Encounter: Payer: Self-pay | Admitting: *Deleted

## 2022-10-01 NOTE — Patient Instructions (Signed)
Visit Information  Thank you for taking time to visit with me today. Please don't hesitate to contact me if I can be of assistance to you.   Following are the goals we discussed today:   Goals Addressed               This Visit's Progress     COMPLETED: Receive Assistance Obtaining Housing Resources. (pt-stated)   On track     Care Coordination Interventions:  Solution-Focused Strategies Activated.  Problem Solving Interventions Employed. Task-Centered Solutions Implemented.   Active Listening & Reflection Utilized.  Verbalization of Feelings Encouraged. Emotional Support Provided. Participation in Counseling Emphasized. Continued Discussion of Referral to Psychiatrist for Psychotropic Medication Management. Continued Discussion of Referral to Therapist for Psychotherapeutic Counseling Services. Crisis Resource Information, Agencies & Resources Reviewed. Confirmed Receipt of The Following List of [employer], [place], to Ensure Understanding: ~ [employer] [place]  ~ Doctor, general practice ~ [employer] in [place], [place] ~ [place] in [place], [place] ~ [employer] ~ [employer] ~ Chief Strategy Officer  ~ [place] ~ Higher education careers adviser in [place], [place] Encouraged to [employer], [employer] of Interest, in [place] to [employer], [place]. CSW Collaboration with Sister, Nonie Hoyer to [place] with Review & Completion of Applications for Housing Assistance. Please Contact CSW Directly (# [phone]), If You Have Questions or Need Assistance with Application Completion & Submission.       Please call the care guide team at [phone] if you need to cancel or reschedule your appointment.    If you are experiencing a Mental Health or [place] or need someone to talk to, please call the Suicide and Crisis Lifeline: 988 call the [place] National Suicide Prevention Lifeline: (901)729-8757 or TTY: (854)176-5680 TTY (628)231-4697) to talk to a trained counselor call 1-800-273-TALK (toll free, 24 hour hotline) go to [facility] [address], [place] [phone]) call the [place]: [phone] call 911  Patient verbalizes understanding of instructions and care plan provided today and agrees to view in [place]. Active MyChart status and patient understanding of how to access instructions and care plan via MyChart confirmed with patient.     No further follow up required.   Nat Christen, BSW, MSW, LCSW  Licensed Education officer, environmental Health System  Mailing [place] [address] [place], [place] [address] Physical Address-[address] [place], [place] [address] Toll Free Main # [phone] Fax # [phone] Cell # [phone] [email or web address]

## 2022-10-01 NOTE — Patient Outreach (Signed)
  Care Coordination   Follow Up Visit Note   10/01/2022  Name: Todd Zhang MRN: [id number] DOB: 1945/09/20  Todd Zhang is a 77 y.o. year old male who sees Patient, [name] for primary care. I spoke with [name] by phone today.  What matters to the patients health and wellness today?  Receive Assistance Obtaining Housing Resources.    Goals Addressed               This Visit's Progress     COMPLETED: Receive Assistance Obtaining Housing Resources. (pt-stated)   On track     Care Coordination Interventions:  Solution-Focused Strategies Activated.  Problem Solving Interventions Employed. Task-Centered Solutions Implemented.   Active Listening & Reflection Utilized.  Verbalization of Feelings Encouraged. Emotional Support Provided. Participation in Counseling Emphasized. Continued Discussion of Referral to Psychiatrist for Psychotropic Medication Management. Continued Discussion of Referral to Therapist for Psychotherapeutic Counseling Services. Crisis Resource Information, Agencies & Resources Reviewed. Confirmed Receipt of The Following List of [employer], [place], to Ensure Understanding: ~ [employer] [place]  ~ Doctor, general practice ~ [employer] in [place], [place] ~ [place] in [place], [place] ~ [employer] ~ [employer] ~ Chief Strategy Officer  ~ [place] ~ Higher education careers adviser in [place], [place] Encouraged to [employer], [employer] of Interest, in [place] to [employer], [place]. CSW Collaboration with Sister, Nonie Hoyer to [place] with Review & Completion of Applications for Housing Assistance. Please Contact CSW Directly (# [phone]), If You Have Questions or  Need Assistance with Application Completion & Submission.       SDOH assessments and interventions completed:  Yes.  Care Coordination Interventions:  Yes, provided.   Follow up plan: No further intervention required.   Encounter Outcome:  Pt. Visit Completed.   Nat Christen, BSW, MSW, LCSW  Licensed Education officer, environmental Health System  Mailing [place] [address] [place], [place] [address] Physical Address-[address], [place],  [address] Toll Free Main # [phone] Fax # [phone] Cell # [phone] [email or web address]

## 2022-10-04 ENCOUNTER — Ambulatory Visit: Payer: Medicaid Other | Attending: Nurse Practitioner | Admitting: Internal Medicine

## 2022-10-04 ENCOUNTER — Encounter: Payer: Self-pay | Admitting: Internal Medicine

## 2022-10-04 VITALS — BP 102/60 | HR 64 | Ht 72.0 in | Wt 218.4 lb

## 2022-10-04 DIAGNOSIS — I4819 Other persistent atrial fibrillation: Secondary | ICD-10-CM | POA: Diagnosis not present

## 2022-10-04 DIAGNOSIS — I350 Nonrheumatic aortic (valve) stenosis: Secondary | ICD-10-CM | POA: Diagnosis not present

## 2022-10-04 DIAGNOSIS — I739 Peripheral vascular disease, unspecified: Secondary | ICD-10-CM | POA: Diagnosis not present

## 2022-10-04 DIAGNOSIS — N1832 Chronic kidney disease, stage 3b: Secondary | ICD-10-CM

## 2022-10-04 DIAGNOSIS — I4891 Unspecified atrial fibrillation: Secondary | ICD-10-CM

## 2022-10-04 DIAGNOSIS — L089 Local infection of the skin and subcutaneous tissue, unspecified: Secondary | ICD-10-CM

## 2022-10-04 NOTE — Patient Instructions (Addendum)
Medication Instructions:  Your physician recommends that you continue on your current medications as directed. Please refer to the Current Medication list given to you today.  Labwork: none  Testing/Procedures: none  Follow-Up: Your physician recommends that you schedule a follow-up appointment in: 6 months  Any Other Special Instructions Will Be Listed Below (If Applicable). You have been referred to Nephrology You have been referred to Podiatry You have been referred to Vascular Surgery  If you need a refill on your cardiac medications before your next appointment, please call your pharmacy.

## 2022-10-04 NOTE — Progress Notes (Addendum)
Cardiology Office Note  Date: 10/04/2022   ID: Mattox, Schorr [birth year], MRN [id number]  PCP:  Patient, [name]  Cardiologist:  Marjo Bicker, MD Electrophysiologist:  None   Reason for Office Visit: Follow-up for A-fib   History of Present Illness: Todd Zhang is a 77 y.o. male known to have persistent A-fib s/p DCCV in 2016, PAD manifested by CLI with ulceration s/p L CFA endarterectomy, L CFA-TP trunk bypass, L CIA stent, L below-knee popliteal with TP endarterectomy and L great toe amputation in 2022, HTN, HLD, DM 2 presented to cardiology clinic for follow-up visit of A-fib.  Patient was initially scheduled for TEE guided DCCV in 07/2022 but had to be canceled as patient stopped taking all his medications. He was eventually seen by myself and Sharlene Dory, APP and currently he is rate controlled on metoprolol tartrate 25 mg twice daily and not on AC due to frequent falls. Last fall was 1 month ago.  He is here for follow-up visit.  He denies any symptoms of angina, DOE, dizziness, lightness, syncope but has palpitations once in a while. He reports having daily pain in his left lower extremity especially in his foot every at nighttime and his second toe is giving him trouble. He is currently on Plavix 75 mg once daily (per vascular surgery).  Past Medical History:  Diagnosis Date   Atrial fibrillation (HCC)    Cervical spinal stenosis    DDD (degenerative disc disease), cervical    DDD (degenerative disc disease), lumbar    Diabetes mellitus without complication (HCC)    Dyspnea    Hyperlipidemia    Hypertension    Peripheral artery disease (HCC)     Past Surgical History:  Procedure Laterality Date   ABDOMINAL AORTOGRAM W/LOWER EXTREMITY N/A 04/30/2021   Procedure: ABDOMINAL AORTOGRAM W/LOWER EXTREMITY;  Surgeon: Leonie Douglas, MD;  [facility]: [facility];  Service: Cardiovascular;  Laterality: N/A;   AMPUTATION TOE     AMPUTATION TOE Left  05/04/2021   Procedure: LEFT GREAT TOE AMPUTATION;  Surgeon: [name], MD;  Location[facility];  Service: Vascular;  Laterality: Left;   BYPASS GRAFT FEMORAL-PERONEAL Left 05/04/2021   Procedure: LEFT COMMON FEMORAL-TIBIOPERONEAL TRUNK BYPASS;  Surgeon: [name], MD;  Location: [facility] OR;  Service: Vascular;  Laterality: Left;   ENDARTERECTOMY FEMORAL Left 05/04/2021   Procedure: LEFT COMMON FEMORAL ENDARTERECTOMY WITH BOVINE PATCH;  Surgeon: [name], MD;  Location: [facility] OR;  Service: Vascular;  Laterality: Left;   ENDARTERECTOMY TIBIOPERONEAL Left 05/04/2021   Procedure: LEFT BELOW KNEE POPITEAL WITH  TIBIOPERONEAL ENDARTERECTOMY;  Surgeon: [name], MD;  Location: [facility] OR;  Service: Vascular;  Laterality: Left;   ENDOVEIN HARVEST OF GREATER SAPHENOUS VEIN  05/04/2021   Procedure: HARVEST OF LEFT GREATER SAPHENOUS VEIN;  Surgeon: [name], MD;  Location: [facility] OR;  Service: Vascular;;   INSERTION OF ILIAC STENT Left 05/04/2021   Procedure: LEFT COMMON ILLIAC ATERY ANGIOPLASTY WITH STENT;  Surgeon: [name], MD;  Location: [facility] OR;  Service: Vascular;  Laterality: Left;   INTRAOPERATIVE ARTERIOGRAM Left 05/04/2021   Procedure: LEFT ARTERY ARTERIOGRAM;  Surgeon: [name], MD;  Location: [facility] OR;  Service: Vascular;  Laterality: Left;   IRRIGATION AND DEBRIDEMENT FOOT Left 05/04/2021   Procedure: IRRIGATION AND DEBRIDEMENT FOOT;  Surgeon: [name], MD;  Location: [facility] OR;  Service: Vascular;  Laterality: Left;   KNEE SURGERY Left  NOSE SURGERY     TEE WITHOUT CARDIOVERSION N/A 05/01/2021   Procedure: TRANSESOPHAGEAL ECHOCARDIOGRAM (TEE);  Surgeon: Skeet Latch, MD;  Location: [facility] ENDOSCOPY;  Service: Cardiovascular;  Laterality: N/A;    Current Outpatient Medications  Medication Sig Dispense Refill   alendronate (FOSAMAX) 70 MG tablet TAKE 1 TABLET BY MOUTH ONCE WEEKLY ON SUNDAY ON ON AN EMPTY STOMACH with full glass of  water 12 tablet 0   atorvastatin (LIPITOR) 40 MG tablet Take 1 tablet (40 mg total) by mouth daily. 90 tablet 1   clopidogrel (PLAVIX) 75 MG tablet Take 1 tablet (75 mg total) by mouth daily. 90 tablet 1   ezetimibe (ZETIA) 10 MG tablet Take 1 tablet (10 mg total) by mouth daily. 90 tablet 3   fenofibrate (TRICOR) 145 MG tablet Take 1 tablet (145 mg total) by mouth daily. 90 tablet 1   furosemide (LASIX) 40 MG tablet Take 1 tablet (40 mg total) by mouth 2 (two) times daily. 60 tablet 3   gabapentin (NEURONTIN) 300 MG capsule Take 1 capsule (300 mg total) by mouth 4 (four) times daily. TAKE 1 CAPSULE BY MOUTH THREE TIMES DAILY FOR PAIN 120 capsule 5   insulin regular (HUMULIN R) 100 units/mL injection INJECT 10 UNITS INTO THE SKIN THREE TIMES DAILY BEFORE MEALS 30 mL 1   metFORMIN (GLUCOPHAGE) 500 MG tablet Take 1 tablet (500 mg total) by mouth 2 (two) times daily. 180 tablet 0   metoprolol tartrate (LOPRESSOR) 25 MG tablet Take 1 tablet (25 mg total) by mouth 2 (two) times daily. 180 tablet 3   nitroGLYCERIN (NITROSTAT) 0.4 MG SL tablet DISSOLVE 1 TABLET UNDER THE TONGUE EVERY 5 MINUTES AS NEEDED FOR CHEST PAIN. DO NOT EXCEED A TOTAL OF 3 DOSES IN 15 MINUTES. 25 tablet 2   pantoprazole (PROTONIX) 40 MG tablet Take 1 tablet (40 mg total) by mouth daily. 90 tablet 1   tamsulosin (FLOMAX) 0.4 MG CAPS capsule Take 1 capsule (0.4 mg total) by mouth daily. 90 capsule 1   glucose blood (ACCU-CHEK AVIVA PLUS) test strip CHECK BLOOD SUGAR FOUR TIMES DAILY Dx E11.40 400 strip 3   Insulin Syringe-Needle U-100 (GLOBAL INJECT EASE INSULIN SYR) 31G X 5/16" 0.3 ML MISC USE WITH INSULIN EVERY DAY Dx E11.40 100 each 3   No current facility-administered medications for this visit.   Allergies:  Asa [aspirin], Penicillins, and Tetanus toxoid   Social History: The patient  reports that he quit smoking about 11 years ago. His smoking use included cigarettes. He started smoking about 56 years ago. He smoked an average  of 1 pack per day. He has been exposed to tobacco smoke. He has never used smokeless tobacco. He reports that he does not currently use drugs. He reports that he does not drink alcohol.   Family History: The patient's family history includes Cancer in his father; Heart disease in his mother; Stroke in his mother.   ROS:  Please see the history of present illness. Otherwise, complete review of systems is positive for none.  All other systems are reviewed and negative.   Physical Exam: VS:  BP 102/60   Pulse 64   Ht 6' (1.829 m)   Wt 218 lb 6.4 oz (99.1 kg)   SpO2 98%   BMI 29.62 kg/m , BMI Body mass index is 29.62 kg/m.  Wt Readings from Last 3 Encounters:  10/04/22 218 lb 6.4 oz (99.1 kg)  09/24/22 213 lb 3.2 oz (96.7 kg)  09/10/22 215 lb (  97.5 kg)    General: Patient appears comfortable at rest. HEENT: Conjunctiva and lids normal, oropharynx clear with moist mucosa. Neck: Supple, no elevated JVP or carotid bruits, no thyromegaly. Lungs: Clear to auscultation, nonlabored breathing at rest. Cardiac: Regular rate and rhythm, no S3 or significant systolic murmur, no pericardial rub. Abdomen: Soft, nontender, no hepatomegaly, bowel sounds present, no guarding or rebound. Extremities: 1+ pitting edema in left lower extremity, distal pulses 2+. Skin: Left second toe erythematous and with distal ulcer Musculoskeletal: No kyphosis. Neuropsychiatric: Alert and oriented x3, affect grossly appropriate.  ECG:  An ECG dated 10/04/2022 was personally reviewed today and demonstrated:  Atrial fibrillation, rate controlled  Recent Labwork: 10/30/2021: TSH 2.310 08/03/2022: ALT 18; AST 21; Hemoglobin 13.6; Platelets 317 08/10/2022: BUN 35; Creatinine, Ser 1.55; Potassium 4.4; Sodium 134     Component Value Date/Time   CHOL 218 (H) 08/03/2022 1030   TRIG 204 (H) 08/03/2022 1030   HDL 44 08/03/2022 1030   CHOLHDL 5.0 08/03/2022 1030   CHOLHDL 5.4 05/04/2021 2353   VLDL 34 05/04/2021 2353    LDLCALC 137 (H) 08/03/2022 1030    Other Studies Reviewed Today:   Assessment and Plan: Patient is a 77 year old M known to have persistent A-fib s/p DCCV in 2016, PAD manifested by CLI with ulceration s/p L CFA endarterectomy, L CFA-TP trunk bypass, L CIA stent, L below-knee popliteal with TP endarterectomy and L great toe amputation in 2022, HTN, HLD, DM 2 presented to cardiology clinic for follow-up visit of A-fib.  # Possible infection of left second toe, palpable DP pulses -Podiatry referral  # Persistent A-fib s/p DCCV in 2016 EKG today showed rate controlled atrial fibrillation, HR 84 -Continue metoprolol tartrate 25 mg twice daily -Not on anticoagulation due to frequent falls. Last fall was 1 month ago. He is probably not a watchman candidate due to frequent forgetfulness.  # PAD manifested by CLI with ulceration s/p L CFA endarterectomy, L CFA-TP trunk bypass, L CIA stent, L below-knee popliteal with TP endarterectomy and L great toe amputation in 2022  -Continue Plavix 75 mg once daily -Continue high intensity statin -Follow-up with vascular surgery (patient missed appointment in 01/2022 with no follow-up)  # HLD -Continue fenofibrate 145 mg once daily, atorvastatin 40 mg once daily and ezetimibe 10 mg once daily. Goal LDL less than 70. LDL from 07/2022 was 137. Patient was also not on medications for a few weeks and hence will need to repeat lipid panel in the future.  # Chronic kidney disease stage IIIb -Nephrology referral  # Mild aortic valve stenosis -Obtain echocardiogram in 3 years, next echo will be in 2026.  I have spent a total of 30 minutes with patient reviewing chart , EKGs, labs and examining patient as well as establishing an assessment and plan that was discussed with the patient.  > 50% of time was spent in direct patient care.   Medication Adjustments/Labs and Tests Ordered: Current medicines are reviewed at length with the patient today.  Concerns  regarding medicines are outlined above.   Tests Ordered: Orders Placed This Encounter  Procedures   Ambulatory referral to Podiatry   Ambulatory referral to Nephrology   Ambulatory referral to Vascular Surgery   EKG 12-Lead    Medication Changes: No orders of the defined types were placed in this encounter.   Disposition:  Follow up  6 months  Signed Fredis Malkiewicz Fidel Levy, MD, 10/04/2022 12:35 PM    [place] [facility]  [address] [place], [place], [place] [address]

## 2022-10-20 ENCOUNTER — Other Ambulatory Visit: Payer: Self-pay | Admitting: Internal Medicine

## 2022-10-21 ENCOUNTER — Encounter (HOSPITAL_COMMUNITY): Payer: Self-pay

## 2022-10-21 ENCOUNTER — Telehealth: Payer: Self-pay | Admitting: Podiatry

## 2022-10-21 DIAGNOSIS — M79675 Pain in left toe(s): Secondary | ICD-10-CM | POA: Diagnosis not present

## 2022-10-21 DIAGNOSIS — E1122 Type 2 diabetes mellitus with diabetic chronic kidney disease: Secondary | ICD-10-CM | POA: Diagnosis not present

## 2022-10-21 DIAGNOSIS — R609 Edema, unspecified: Secondary | ICD-10-CM | POA: Diagnosis not present

## 2022-10-21 DIAGNOSIS — M79672 Pain in left foot: Secondary | ICD-10-CM | POA: Diagnosis not present

## 2022-10-21 DIAGNOSIS — N289 Disorder of kidney and ureter, unspecified: Secondary | ICD-10-CM | POA: Diagnosis not present

## 2022-10-21 DIAGNOSIS — N189 Chronic kidney disease, unspecified: Secondary | ICD-10-CM | POA: Diagnosis not present

## 2022-10-21 DIAGNOSIS — Z7984 Long term (current) use of oral hypoglycemic drugs: Secondary | ICD-10-CM | POA: Diagnosis not present

## 2022-10-21 DIAGNOSIS — Z886 Allergy status to analgesic agent status: Secondary | ICD-10-CM | POA: Diagnosis not present

## 2022-10-21 DIAGNOSIS — M7989 Other specified soft tissue disorders: Secondary | ICD-10-CM | POA: Diagnosis not present

## 2022-10-21 DIAGNOSIS — I129 Hypertensive chronic kidney disease with stage 1 through stage 4 chronic kidney disease, or unspecified chronic kidney disease: Secondary | ICD-10-CM | POA: Diagnosis not present

## 2022-10-21 DIAGNOSIS — I96 Gangrene, not elsewhere classified: Secondary | ICD-10-CM | POA: Diagnosis not present

## 2022-10-21 DIAGNOSIS — Z87891 Personal history of nicotine dependence: Secondary | ICD-10-CM | POA: Diagnosis not present

## 2022-10-21 DIAGNOSIS — Z880 Allergy status to penicillin: Secondary | ICD-10-CM | POA: Diagnosis not present

## 2022-10-21 NOTE — Telephone Encounter (Signed)
Received call from [facility] regarding this patient with gangrene/ulcers. All surgeons at the hospital have declined this patient and [facility] with admission to the hospitalitis service if podiatry agrees. Although we only do assigned consults in the hospital I will see the patient tomorrow upon transfer. Once transferred please obtained x-ray/MRI and recommend vascular consult.

## 2022-10-22 ENCOUNTER — Inpatient Hospital Stay (HOSPITAL_COMMUNITY)
Admission: AD | Admit: 2022-10-22 | Discharge: 2022-10-28 | DRG: 239 | Disposition: A | Discharge status: Home / Self Care | Payer: Medicare HMO | Attending: Internal Medicine | Admitting: Internal Medicine

## 2022-10-22 ENCOUNTER — Encounter (HOSPITAL_COMMUNITY): Payer: Medicare HMO

## 2022-10-22 ENCOUNTER — Inpatient Hospital Stay (HOSPITAL_COMMUNITY): Payer: Medicare HMO

## 2022-10-22 DIAGNOSIS — N1832 Chronic kidney disease, stage 3b: Secondary | ICD-10-CM | POA: Diagnosis present

## 2022-10-22 DIAGNOSIS — Z888 Allergy status to other drugs, medicaments and biological substances status: Secondary | ICD-10-CM | POA: Diagnosis not present

## 2022-10-22 DIAGNOSIS — E1140 Type 2 diabetes mellitus with diabetic neuropathy, unspecified: Secondary | ICD-10-CM | POA: Diagnosis not present

## 2022-10-22 DIAGNOSIS — E11621 Type 2 diabetes mellitus with foot ulcer: Secondary | ICD-10-CM | POA: Diagnosis present

## 2022-10-22 DIAGNOSIS — I998 Other disorder of circulatory system: Secondary | ICD-10-CM | POA: Diagnosis not present

## 2022-10-22 DIAGNOSIS — L97529 Non-pressure chronic ulcer of other part of left foot with unspecified severity: Secondary | ICD-10-CM | POA: Diagnosis present

## 2022-10-22 DIAGNOSIS — Z886 Allergy status to analgesic agent status: Secondary | ICD-10-CM

## 2022-10-22 DIAGNOSIS — E1169 Type 2 diabetes mellitus with other specified complication: Secondary | ICD-10-CM | POA: Diagnosis not present

## 2022-10-22 DIAGNOSIS — L03032 Cellulitis of left toe: Secondary | ICD-10-CM | POA: Diagnosis present

## 2022-10-22 DIAGNOSIS — J441 Chronic obstructive pulmonary disease with (acute) exacerbation: Secondary | ICD-10-CM | POA: Diagnosis not present

## 2022-10-22 DIAGNOSIS — E1122 Type 2 diabetes mellitus with diabetic chronic kidney disease: Secondary | ICD-10-CM | POA: Diagnosis present

## 2022-10-22 DIAGNOSIS — Z7983 Long term (current) use of bisphosphonates: Secondary | ICD-10-CM

## 2022-10-22 DIAGNOSIS — E876 Hypokalemia: Secondary | ICD-10-CM | POA: Diagnosis present

## 2022-10-22 DIAGNOSIS — E871 Hypo-osmolality and hyponatremia: Secondary | ICD-10-CM | POA: Diagnosis not present

## 2022-10-22 DIAGNOSIS — I10 Essential (primary) hypertension: Secondary | ICD-10-CM | POA: Diagnosis not present

## 2022-10-22 DIAGNOSIS — Z79899 Other long term (current) drug therapy: Secondary | ICD-10-CM

## 2022-10-22 DIAGNOSIS — E1165 Type 2 diabetes mellitus with hyperglycemia: Secondary | ICD-10-CM | POA: Diagnosis present

## 2022-10-22 DIAGNOSIS — Z823 Family history of stroke: Secondary | ICD-10-CM

## 2022-10-22 DIAGNOSIS — A480 Gas gangrene: Secondary | ICD-10-CM | POA: Diagnosis not present

## 2022-10-22 DIAGNOSIS — Z809 Family history of malignant neoplasm, unspecified: Secondary | ICD-10-CM

## 2022-10-22 DIAGNOSIS — I70222 Atherosclerosis of native arteries of extremities with rest pain, left leg: Secondary | ICD-10-CM | POA: Diagnosis present

## 2022-10-22 DIAGNOSIS — E785 Hyperlipidemia, unspecified: Secondary | ICD-10-CM | POA: Diagnosis present

## 2022-10-22 DIAGNOSIS — I480 Paroxysmal atrial fibrillation: Secondary | ICD-10-CM | POA: Diagnosis present

## 2022-10-22 DIAGNOSIS — I129 Hypertensive chronic kidney disease with stage 1 through stage 4 chronic kidney disease, or unspecified chronic kidney disease: Secondary | ICD-10-CM | POA: Diagnosis not present

## 2022-10-22 DIAGNOSIS — Z9889 Other specified postprocedural states: Secondary | ICD-10-CM | POA: Diagnosis not present

## 2022-10-22 DIAGNOSIS — I96 Gangrene, not elsewhere classified: Secondary | ICD-10-CM | POA: Diagnosis not present

## 2022-10-22 DIAGNOSIS — M65872 Other synovitis and tenosynovitis, left ankle and foot: Secondary | ICD-10-CM | POA: Diagnosis not present

## 2022-10-22 DIAGNOSIS — J449 Chronic obstructive pulmonary disease, unspecified: Secondary | ICD-10-CM | POA: Diagnosis not present

## 2022-10-22 DIAGNOSIS — Z7984 Long term (current) use of oral hypoglycemic drugs: Secondary | ICD-10-CM | POA: Diagnosis not present

## 2022-10-22 DIAGNOSIS — Z95828 Presence of other vascular implants and grafts: Secondary | ICD-10-CM

## 2022-10-22 DIAGNOSIS — Z880 Allergy status to penicillin: Secondary | ICD-10-CM

## 2022-10-22 DIAGNOSIS — E1152 Type 2 diabetes mellitus with diabetic peripheral angiopathy with gangrene: Secondary | ICD-10-CM | POA: Diagnosis not present

## 2022-10-22 DIAGNOSIS — M86172 Other acute osteomyelitis, left ankle and foot: Secondary | ICD-10-CM | POA: Diagnosis not present

## 2022-10-22 DIAGNOSIS — M86179 Other acute osteomyelitis, unspecified ankle and foot: Secondary | ICD-10-CM | POA: Diagnosis not present

## 2022-10-22 DIAGNOSIS — D631 Anemia in chronic kidney disease: Secondary | ICD-10-CM | POA: Diagnosis not present

## 2022-10-22 DIAGNOSIS — Z89412 Acquired absence of left great toe: Secondary | ICD-10-CM

## 2022-10-22 DIAGNOSIS — I739 Peripheral vascular disease, unspecified: Secondary | ICD-10-CM | POA: Diagnosis present

## 2022-10-22 DIAGNOSIS — M86672 Other chronic osteomyelitis, left ankle and foot: Secondary | ICD-10-CM | POA: Diagnosis not present

## 2022-10-22 DIAGNOSIS — Z7902 Long term (current) use of antithrombotics/antiplatelets: Secondary | ICD-10-CM

## 2022-10-22 DIAGNOSIS — L089 Local infection of the skin and subcutaneous tissue, unspecified: Secondary | ICD-10-CM | POA: Diagnosis not present

## 2022-10-22 DIAGNOSIS — Z794 Long term (current) use of insulin: Secondary | ICD-10-CM

## 2022-10-22 DIAGNOSIS — N289 Disorder of kidney and ureter, unspecified: Secondary | ICD-10-CM | POA: Diagnosis not present

## 2022-10-22 DIAGNOSIS — M79675 Pain in left toe(s): Secondary | ICD-10-CM | POA: Diagnosis not present

## 2022-10-22 DIAGNOSIS — R600 Localized edema: Secondary | ICD-10-CM | POA: Diagnosis not present

## 2022-10-22 DIAGNOSIS — M869 Osteomyelitis, unspecified: Secondary | ICD-10-CM | POA: Diagnosis not present

## 2022-10-22 DIAGNOSIS — Z89422 Acquired absence of other left toe(s): Secondary | ICD-10-CM

## 2022-10-22 DIAGNOSIS — N183 Chronic kidney disease, stage 3 unspecified: Secondary | ICD-10-CM | POA: Diagnosis present

## 2022-10-22 DIAGNOSIS — Z87891 Personal history of nicotine dependence: Secondary | ICD-10-CM

## 2022-10-22 DIAGNOSIS — L0390 Cellulitis, unspecified: Secondary | ICD-10-CM | POA: Diagnosis not present

## 2022-10-22 DIAGNOSIS — Z8249 Family history of ischemic heart disease and other diseases of the circulatory system: Secondary | ICD-10-CM

## 2022-10-22 DIAGNOSIS — Z7901 Long term (current) use of anticoagulants: Secondary | ICD-10-CM

## 2022-10-22 DIAGNOSIS — I70245 Atherosclerosis of native arteries of left leg with ulceration of other part of foot: Secondary | ICD-10-CM | POA: Diagnosis not present

## 2022-10-22 DIAGNOSIS — I160 Hypertensive urgency: Secondary | ICD-10-CM | POA: Diagnosis not present

## 2022-10-22 DIAGNOSIS — M19072 Primary osteoarthritis, left ankle and foot: Secondary | ICD-10-CM | POA: Diagnosis not present

## 2022-10-22 LAB — BASIC METABOLIC PANEL
Anion gap: 14 (ref 5–15)
BUN: 28 mg/dL — ABNORMAL HIGH (ref 8–23)
CO2: 27 mmol/L (ref 22–32)
Calcium: 9.2 mg/dL (ref 8.9–10.3)
Chloride: 94 mmol/L — ABNORMAL LOW (ref 98–111)
Creatinine, Ser: 1.88 mg/dL — ABNORMAL HIGH (ref 0.61–1.24)
GFR, Estimated: 36 mL/min — ABNORMAL LOW (ref >60)
Glucose, Bld: 183 mg/dL — ABNORMAL HIGH (ref 70–99)
Potassium: 3.9 mmol/L (ref 3.5–5.1)
Sodium: 135 mmol/L (ref 135–145)

## 2022-10-22 LAB — HEMOGLOBIN A1C
Hgb A1c MFr Bld: 8.8 % — ABNORMAL HIGH (ref 4.8–5.6)
Mean Plasma Glucose: 205.86 mg/dL

## 2022-10-22 LAB — GLUCOSE, CAPILLARY
Glucose-Capillary: 188 mg/dL — ABNORMAL HIGH (ref 70–99)
Glucose-Capillary: 239 mg/dL — ABNORMAL HIGH (ref 70–99)

## 2022-10-22 MED ORDER — METOPROLOL TARTRATE 25 MG PO TABS
25.0000 mg | ORAL_TABLET | Freq: Two times a day (BID) | ORAL | Status: DC
  Administered 2022-10-22 – 2022-10-28 (×13): 25 mg via ORAL
  Filled 2022-10-22 (×13): qty 1

## 2022-10-22 MED ORDER — FUROSEMIDE 40 MG PO TABS
40.0000 mg | ORAL_TABLET | Freq: Two times a day (BID) | ORAL | Status: DC
  Administered 2022-10-22 – 2022-10-24 (×4): 40 mg via ORAL
  Filled 2022-10-22 (×4): qty 1

## 2022-10-22 MED ORDER — BISACODYL 5 MG PO TBEC
5.0000 mg | DELAYED_RELEASE_TABLET | Freq: Every day | ORAL | Status: DC | PRN
  Administered 2022-10-24: 5 mg via ORAL
  Filled 2022-10-22: qty 1

## 2022-10-22 MED ORDER — FENOFIBRATE 54 MG PO TABS
54.0000 mg | ORAL_TABLET | Freq: Every day | ORAL | Status: DC
  Administered 2022-10-24 – 2022-10-28 (×5): 54 mg via ORAL
  Filled 2022-10-22 (×7): qty 1

## 2022-10-22 MED ORDER — PANTOPRAZOLE SODIUM 40 MG PO TBEC
40.0000 mg | DELAYED_RELEASE_TABLET | Freq: Every day | ORAL | Status: DC
  Administered 2022-10-22 – 2022-10-28 (×7): 40 mg via ORAL
  Filled 2022-10-22 (×7): qty 1

## 2022-10-22 MED ORDER — VANCOMYCIN HCL 1500 MG/300ML IV SOLN
1500.0000 mg | Freq: Once | INTRAVENOUS | Status: AC
  Administered 2022-10-22: 1500 mg via INTRAVENOUS
  Filled 2022-10-22: qty 300

## 2022-10-22 MED ORDER — INSULIN ASPART 100 UNIT/ML IJ SOLN
10.0000 [IU] | Freq: Three times a day (TID) | INTRAMUSCULAR | Status: DC
  Administered 2022-10-23 – 2022-10-27 (×6): 10 [IU] via SUBCUTANEOUS

## 2022-10-22 MED ORDER — BACID PO TABS: 2.0000 | ORAL_TABLET | Freq: Three times a day (TID) | ORAL | Status: DC

## 2022-10-22 MED ORDER — CLOPIDOGREL BISULFATE 75 MG PO TABS
75.0000 mg | ORAL_TABLET | Freq: Every day | ORAL | Status: DC
  Administered 2022-10-22 – 2022-10-28 (×7): 75 mg via ORAL
  Filled 2022-10-22 (×7): qty 1

## 2022-10-22 MED ORDER — OXYCODONE HCL 5 MG PO TABS
5.0000 mg | ORAL_TABLET | ORAL | Status: DC | PRN
  Administered 2022-10-22 – 2022-10-27 (×10): 5 mg via ORAL
  Filled 2022-10-22 (×11): qty 1

## 2022-10-22 MED ORDER — TAMSULOSIN HCL 0.4 MG PO CAPS
0.4000 mg | ORAL_CAPSULE | Freq: Every day | ORAL | Status: DC
  Administered 2022-10-22 – 2022-10-28 (×7): 0.4 mg via ORAL
  Filled 2022-10-22 (×7): qty 1

## 2022-10-22 MED ORDER — GABAPENTIN 300 MG PO CAPS
300.0000 mg | ORAL_CAPSULE | Freq: Four times a day (QID) | ORAL | Status: DC
  Administered 2022-10-22 – 2022-10-25 (×3): 300 mg via ORAL
  Filled 2022-10-22 (×15): qty 1

## 2022-10-22 MED ORDER — INSULIN ASPART 100 UNIT/ML IJ SOLN
0.0000 [IU] | Freq: Every day | INTRAMUSCULAR | Status: DC
  Administered 2022-10-22: 2 [IU] via SUBCUTANEOUS

## 2022-10-22 MED ORDER — VANCOMYCIN HCL 1250 MG/250ML IV SOLN
1250.0000 mg | INTRAVENOUS | Status: DC
  Administered 2022-10-23 – 2022-10-24 (×2): 1250 mg via INTRAVENOUS
  Filled 2022-10-22 (×2): qty 250

## 2022-10-22 MED ORDER — ATORVASTATIN CALCIUM 40 MG PO TABS
40.0000 mg | ORAL_TABLET | Freq: Every day | ORAL | Status: DC
  Administered 2022-10-22 – 2022-10-28 (×7): 40 mg via ORAL
  Filled 2022-10-22 (×7): qty 1

## 2022-10-22 MED ORDER — RISAQUAD PO CAPS
1.0000 | ORAL_CAPSULE | Freq: Three times a day (TID) | ORAL | Status: DC
  Administered 2022-10-22 – 2022-10-28 (×17): 1 via ORAL
  Filled 2022-10-22 (×17): qty 1

## 2022-10-22 MED ORDER — METRONIDAZOLE 500 MG/100ML IV SOLN
500.0000 mg | Freq: Two times a day (BID) | INTRAVENOUS | Status: DC
  Administered 2022-10-22 – 2022-10-28 (×11): 500 mg via INTRAVENOUS
  Filled 2022-10-22 (×12): qty 100

## 2022-10-22 MED ORDER — SODIUM CHLORIDE 0.9 % IV SOLN
2.0000 g | Freq: Two times a day (BID) | INTRAVENOUS | Status: DC
  Administered 2022-10-22 – 2022-10-28 (×12): 2 g via INTRAVENOUS
  Filled 2022-10-22 (×12): qty 12.5

## 2022-10-22 MED ORDER — INSULIN ASPART 100 UNIT/ML IJ SOLN
0.0000 [IU] | Freq: Three times a day (TID) | INTRAMUSCULAR | Status: DC
  Administered 2022-10-23 – 2022-10-24 (×3): 2 [IU] via SUBCUTANEOUS
  Administered 2022-10-24: 1 [IU] via SUBCUTANEOUS
  Administered 2022-10-25 (×2): 2 [IU] via SUBCUTANEOUS
  Administered 2022-10-26 – 2022-10-28 (×5): 1 [IU] via SUBCUTANEOUS
  Administered 2022-10-28: 2 [IU] via SUBCUTANEOUS

## 2022-10-22 MED ORDER — EZETIMIBE 10 MG PO TABS
10.0000 mg | ORAL_TABLET | Freq: Every day | ORAL | Status: DC
  Administered 2022-10-22 – 2022-10-28 (×7): 10 mg via ORAL
  Filled 2022-10-22 (×7): qty 1

## 2022-10-22 MED ORDER — HEPARIN SODIUM (PORCINE) 5000 UNIT/ML IJ SOLN
5000.0000 [IU] | Freq: Three times a day (TID) | INTRAMUSCULAR | Status: DC
  Administered 2022-10-22 – 2022-10-28 (×17): 5000 [IU] via SUBCUTANEOUS
  Filled 2022-10-22 (×17): qty 1

## 2022-10-22 MED ORDER — ACETAMINOPHEN 650 MG RE SUPP: 650.0000 mg | Freq: Four times a day (QID) | RECTAL | Status: DC | PRN

## 2022-10-22 MED ORDER — ACETAMINOPHEN 325 MG PO TABS
650.0000 mg | ORAL_TABLET | Freq: Four times a day (QID) | ORAL | Status: DC | PRN
  Filled 2022-10-22: qty 2

## 2022-10-22 NOTE — Consult Note (Signed)
Reason for Consult: Gangrene left foot Referring Physician: Dr. Wynetta Fines, MD  Todd Zhang is an 77 y.o. male.  HPI:  77 y.o. male with medical history significant of IDDM, HTN, PVD with status post left common iliac stenting and left common femoral enterectomy/bovine patch and left femoral to TP trunk bypass in 2022, chronic left second toe ulcer, mild aortic stenosis, PAF s/p DCCV in 2016 Xarelto, CKD stage IIIb, came to [facility] ED for evaluation worsening of left second toe discoloration and rash and swelling.   Patient has been doing this for the last several months has been on 2 rounds of oral antibiotics.  States the toe has continued to worsen and is turned dark.,  Denies fevers or chills.    Past Medical History:  Diagnosis Date   Atrial fibrillation ([employer])    Cervical spinal stenosis    DDD (degenerative disc disease), cervical    DDD (degenerative disc disease), lumbar    Diabetes mellitus without complication ([place])    Dyspnea    Hyperlipidemia    Hypertension    Peripheral artery disease ([place])     Past Surgical History:  Procedure Laterality Date   ABDOMINAL AORTOGRAM W/LOWER EXTREMITY N/A 04/30/2021   Procedure: ABDOMINAL AORTOGRAM W/LOWER EXTREMITY;  Surgeon: Cherre Robins, MD;  Location: [place] [facility];  Service: Cardiovascular;  Laterality: N/A;   AMPUTATION TOE     AMPUTATION TOE Left 05/04/2021   Procedure: LEFT GREAT TOE AMPUTATION;  Surgeon: Marty Heck, MD;  Location: [place];  Service: Vascular;  Laterality: Left;   BYPASS GRAFT FEMORAL-PERONEAL Left 05/04/2021   Procedure: LEFT COMMON FEMORAL-TIBIOPERONEAL TRUNK BYPASS;  Surgeon: Marty Heck, MD;  Location: [place];  Service: Vascular;  Laterality: Left;   ENDARTERECTOMY FEMORAL Left 05/04/2021   Procedure: LEFT COMMON FEMORAL ENDARTERECTOMY WITH BOVINE PATCH;  Surgeon: Marty Heck, MD;  Location: [place];  Service: Vascular;  Laterality: Left;   ENDARTERECTOMY TIBIOPERONEAL  Left 05/04/2021   Procedure: LEFT BELOW KNEE POPITEAL WITH  TIBIOPERONEAL ENDARTERECTOMY;  Surgeon: Marty Heck, MD;  Location: [place];  Service: Vascular;  Laterality: Left;   ENDOVEIN HARVEST OF GREATER SAPHENOUS VEIN  05/04/2021   Procedure: HARVEST OF LEFT GREATER SAPHENOUS VEIN;  Surgeon: Marty Heck, MD;  Location: [place];  Service: Vascular;;   INSERTION OF ILIAC STENT Left 05/04/2021   Procedure: LEFT COMMON ILLIAC ATERY ANGIOPLASTY WITH STENT;  Surgeon: Marty Heck, MD;  Location: [place];  Service: Vascular;  Laterality: Left;   INTRAOPERATIVE ARTERIOGRAM Left 05/04/2021   Procedure: LEFT ARTERY ARTERIOGRAM;  Surgeon: Marty Heck, MD;  Location: [place];  Service: Vascular;  Laterality: Left;   IRRIGATION AND DEBRIDEMENT FOOT Left 05/04/2021   Procedure: IRRIGATION AND DEBRIDEMENT FOOT;  Surgeon: Marty Heck, MD;  Location: [place];  Service: Vascular;  Laterality: Left;   KNEE SURGERY Left    NOSE SURGERY     TEE WITHOUT CARDIOVERSION N/A 05/01/2021   Procedure: TRANSESOPHAGEAL ECHOCARDIOGRAM (TEE);  Surgeon: Skeet Latch, MD;  Location: [facility] ENDOSCOPY;  Service: Cardiovascular;  Laterality: N/A;    Family History  Problem Relation Age of Onset   Stroke Mother    Heart disease Mother        CABG   Cancer Father     Social History:  reports that he quit smoking about 11 years ago. His smoking use included cigarettes. He started smoking about 56 years ago. He smoked an average of 1 pack  per day. He has been exposed to tobacco smoke. He has never used smokeless tobacco. He reports that he does not currently use drugs. He reports that he does not drink alcohol.  Allergies:  Allergies  Allergen Reactions   Asa [Aspirin] Hives   Penicillins Hives    Hives + vomiting as an adult  - tolerated Ceftriaxone 04/2021   Tetanus Toxoid     Medications: I have reviewed the patient's current medications.  Results for orders placed or performed during  the hospital encounter of 10/22/22 (from the past 48 hour(s))  Hemoglobin A1c     Status: Abnormal   Collection Time: 10/22/22  1:22 PM  Result Value Ref Range   Hgb A1c MFr Bld 8.8 (H) 4.8 - 5.6 %    Comment: (NOTE) Pre diabetes:          5.7%-6.4%  Diabetes:              >6.4%  Glycemic control for   <7.0% adults with diabetes    Mean Plasma Glucose 205.86 mg/dL    Comment: Performed at [facility], [place] [address] [place], [place] [id number]  Basic metabolic panel     Status: Abnormal   Collection Time: 10/22/22  1:33 PM  Result Value Ref Range   Sodium 135 135 - 145 mmol/L   Potassium 3.9 3.5 - 5.1 mmol/L   Chloride 94 (L) 98 - 111 mmol/L   CO2 27 22 - 32 mmol/L   Glucose, Bld 183 (H) 70 - 99 mg/dL    Comment: Glucose reference range applies only to samples taken after fasting for at least 8 hours.   BUN 28 (H) 8 - 23 mg/dL   Creatinine, Ser 1.88 (H) 0.61 - 1.24 mg/dL   Calcium 9.2 8.9 - 10.3 mg/dL   GFR, Estimated 36 (L) >60 mL/min    Comment: (NOTE) Calculated using the CKD-EPI Creatinine Equation (2021)    Anion gap 14 5 - 15    Comment: Performed at [facility] [address], [place], [place] [address]  Glucose, capillary     Status: Abnormal   Collection Time: 10/22/22  5:13 PM  Result Value Ref Range   Glucose-Capillary 188 (H) 70 - 99 mg/dL    Comment: Glucose reference range applies only to samples taken after fasting for at least 8 hours.    No results found.  Review of Systems Blood pressure 124/71, pulse 100, temperature 98.3 F (36.8 C), temperature source Oral, resp. rate 18, SpO2 96 %. Physical Exam General: AAO x3, NAD  Dermatological: Gangrenous changes present to the left second toe. The medial aspect there is an area of fluctuation.  I was able open Very minimal purulence was obtained mostly necrotic tissue.  Edema erythema present.  No crepitation.      Vascular: Difficult to palpate pulses  Neruologic: Sensation  decreased  Musculoskeletal: No pain on exam   Assessment/Plan: Gangrene left foot  -MRI is pending.  Continue broad-spectrum antibiotics.  He is in the least a toe amputation.  Discussed with vascular surgery as well.  Plan interpretation of the weekend versus after vascular intervention. -On the area that there appear to be fluid I opened this up with a suture scissor.  There is minimal drainage mostly just necrotic tissue.  Dressing applied. -Patient is well aware of need for amputation.  Discussed findings of this.   Trula Slade 10/22/2022, 8:30 PM

## 2022-10-22 NOTE — Consult Note (Addendum)
Hospital Consult    Reason for Consult:  left 2nd toe discoloration Requesting Physician:  Roosevelt Locks MRN #:  [id number]  History of Present Illness: This is a 77 y.o. 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  He is known to our practice with hx of left CIA angioplasty and stent placement, left CFA endarterectomy with profundoplasty and bovine pericardial patch angioplasty, left BK  popliteal artery TP trunk endarterectomy and left CFA to TPT bypass with ipsilateral non reversed GSV, ray amputation of left great toe and debridement of left foot infection with dorsal foot abscess on 05/04/2021 by Dr. Carlis Abbott.  He was seen back in the office 08/11/2021 at which time he had a palpable pulse at the ankle but still had an open wound at the ray amputation site.  He had deferred wound clinic and wanted to do wound care on his own.  He had been using dial soap and santyl.  His ABI that day was 0.94 on the left with biphasic waveforms and patent iliac stent and bypass.  He was scheduled for 6 month follow up.    He was seen at outside hospital for left 2nd toe ulceration that has gotten bigger and the toe has turned black.  He has been following with ortho surgeon and had a couple rounds of abx.    Vascular surgery has been consulted for evaluation.  He states he has had an issue with his toe for 2-3 months.  He has pain in his foot at night.  It feels better to be up but then his foot swells.  He does not have any issues with the right leg.  He denies claudication.  He denies any fever or chills.   Pt also has PMH significant for afib, HTN, HLD, DM, DDD, CKD.  His creatinine in November 2023 was 1.55.  BMP currently pending.    The pt is on a statin for cholesterol management.  The pt is not on a daily aspirin.   Other AC:  Plavix The pt is on BB for hypertension.   The pt is diabetic.   Tobacco hx:  former  Past Medical History:  Diagnosis Date   Atrial fibrillation ([place])     Cervical spinal stenosis    DDD (degenerative disc disease), cervical    DDD (degenerative disc disease), lumbar    Diabetes mellitus without complication ([place])    Dyspnea    Hyperlipidemia    Hypertension    Peripheral artery disease ([place])     Past Surgical History:  Procedure Laterality Date   ABDOMINAL AORTOGRAM W/LOWER EXTREMITY N/A 04/30/2021   Procedure: ABDOMINAL AORTOGRAM W/LOWER EXTREMITY;  Surgeon: Cherre Robins, MD;  Location: [place] [facility];  Service: Cardiovascular;  Laterality: N/A;   AMPUTATION TOE     AMPUTATION TOE Left 05/04/2021   Procedure: LEFT GREAT TOE AMPUTATION;  Surgeon: Marty Heck, MD;  Location: [place];  Service: Vascular;  Laterality: Left;   BYPASS GRAFT FEMORAL-PERONEAL Left 05/04/2021   Procedure: LEFT COMMON FEMORAL-TIBIOPERONEAL TRUNK BYPASS;  Surgeon: Marty Heck, MD;  Location: [place];  Service: Vascular;  Laterality: Left;   ENDARTERECTOMY FEMORAL Left 05/04/2021   Procedure: LEFT COMMON FEMORAL ENDARTERECTOMY WITH BOVINE PATCH;  Surgeon: Marty Heck, MD;  Location: [place];  Service: Vascular;  Laterality: Left;   ENDARTERECTOMY TIBIOPERONEAL Left 05/04/2021   Procedure: LEFT BELOW KNEE POPITEAL WITH  TIBIOPERONEAL ENDARTERECTOMY;  Surgeon: Marty Heck, MD;  Location: [facility]  OR;  Service: Vascular;  Laterality: Left;   ENDOVEIN HARVEST OF GREATER SAPHENOUS VEIN  05/04/2021   Procedure: HARVEST OF LEFT GREATER SAPHENOUS VEIN;  Surgeon: Marty Heck, MD;  Location: [place];  Service: Vascular;;   INSERTION OF ILIAC STENT Left 05/04/2021   Procedure: LEFT COMMON ILLIAC ATERY ANGIOPLASTY WITH STENT;  Surgeon: Marty Heck, MD;  Location: [place];  Service: Vascular;  Laterality: Left;   INTRAOPERATIVE ARTERIOGRAM Left 05/04/2021   Procedure: LEFT ARTERY ARTERIOGRAM;  Surgeon: Marty Heck, MD;  Location: [place];  Service: Vascular;  Laterality: Left;   IRRIGATION AND DEBRIDEMENT FOOT Left 05/04/2021    Procedure: IRRIGATION AND DEBRIDEMENT FOOT;  Surgeon: Marty Heck, MD;  Location: [place];  Service: Vascular;  Laterality: Left;   KNEE SURGERY Left    NOSE SURGERY     TEE WITHOUT CARDIOVERSION N/A 05/01/2021   Procedure: TRANSESOPHAGEAL ECHOCARDIOGRAM (TEE);  Surgeon: Skeet Latch, MD;  Location: [facility]OS[facility];  Service: Cardiovascular;  Laterality: N/A;    Allergies  Allergen Reactions   Asa [Aspirin] Hives   Penicillins Hives    Hives + vomiting as an adult  - tolerated Ceftriaxone 04/2021   Tetanus Toxoid     Prior to Admission medications   Medication Sig Start Date End Date Taking? Authorizing Provider  alendronate (FOSAMAX) 70 MG tablet TAKE 1 TABLET BY MOUTH ONCE WEEKLY ON SUNDAY ON ON AN EMPTY STOMACH with full glass of water 06/29/22   [name], [name], FNP  atorvastatin (LIPITOR) 40 MG tablet Take 1 tablet (40 mg total) by mouth daily. 08/03/22   [name], [name], FNP  clopidogrel (PLAVIX) 75 MG tablet Take 1 tablet (75 mg total) by mouth daily. 08/03/22   [name], [name], FNP  ezetimibe (ZETIA) 10 MG tablet Take 1 tablet (10 mg total) by mouth daily. 08/03/22 07/29/23  [name], [name], FNP  fenofibrate (TRICOR) 145 MG tablet Take 1 tablet (145 mg total) by mouth daily. 08/03/22   [name], [name], FNP  furosemide (LASIX) 40 MG tablet Take 1 tablet (40 mg total) by mouth 2 (two) times daily. 08/03/22   [name], [name], FNP  gabapentin (NEURONTIN) 300 MG capsule Take 1 capsule (300 mg total) by mouth 4 (four) times daily. TAKE 1 CAPSULE BY MOUTH THREE TIMES DAILY FOR PAIN 08/03/22   [name], FNP  glucose blood (ACCU-CHEK AVIVA PLUS) test strip CHECK BLOOD SUGAR FOUR TIMES DAILY Dx E11.40 05/12/22   [name], FNP  insulin regular (HUMULIN R) 100 units/mL injection INJECT 10 UNITS INTO THE SKIN THREE TIMES DAILY BEFORE MEALS 02/04/22   [name], [name], FNP  Insulin Syringe-Needle U-100 (GLOBAL INJECT EASE  INSULIN SYR) 31G X 5/16" 0.3 ML MISC USE WITH INSULIN EVERY DAY Dx E11.40 03/25/22   Dettinger, Fransisca Kaufmann, MD  metFORMIN (GLUCOPHAGE) 500 MG tablet Take 1 tablet (500 mg total) by mouth 2 (two) times daily. 08/03/22   [name] [name], FNP  metoprolol tartrate (LOPRESSOR) 25 MG tablet Take 1 tablet (25 mg total) by mouth 2 (two) times daily. 09/10/22 09/05/23  Finis Bud, NP  nitroGLYCERIN (NITROSTAT) 0.4 MG SL tablet DISSOLVE 1 TABLET UNDER THE TONGUE EVERY 5 MINUTES AS NEEDED FOR CHEST PAIN. DO NOT EXCEED A TOTAL OF 3 DOSES IN 15 MINUTES. 09/28/22   Mallipeddi, Vishnu P, MD  pantoprazole (PROTONIX) 40 MG tablet Take 1 tablet (40 mg total) by mouth daily. 08/03/22   [name] [name], FNP  tamsulosin (FLOMAX) 0.4 MG CAPS capsule Take 1 capsule (0.4 mg total) by mouth  daily. 08/03/22   [name], FNP    Social History   Socioeconomic History   Marital status: Divorced    Spouse name: Not on file   Number of children: 2   Years of education: 11   Highest education level: 11th grade  Occupational History   Occupation: Disabled  Tobacco Use   Smoking status: Former    Packs/day: 1.00    Types: Cigarettes    Start date: 06/19/1966    Quit date: 06/20/2011    Years since quitting: 11.3    Passive exposure: Past   Smokeless tobacco: Never  Vaping Use   Vaping Use: Never used  Substance and Sexual Activity   Alcohol use: No    Comment: quit drinking in 1982   Drug use: Not Currently    Comment: marijuana and cocaine in the past, quit in 2012   Sexual activity: Not Currently  Other Topics Concern   Not on file  Social History Narrative   Lives alone - ground level apartment   Social Determinants of Health   Financial Resource Strain: Low Risk  (09/21/2022)   Overall Financial Resource Strain (CARDIA)    Difficulty of Paying Living Expenses: Not very hard  Food Insecurity: No Food Insecurity (09/21/2022)   Hunger Vital Sign    Worried About Running Out of Food  in the Last Year: Never true    [place] in the Last Year: Never true  Transportation Needs: No Transportation Needs (09/21/2022)   PRAPARE - Hydrologist (Medical): No    Lack of Transportation (Non-Medical): No  Physical Activity: Inactive (09/21/2022)   Exercise Vital Sign    Days of Exercise per Week: 0 days    Minutes of Exercise per Session: 0 min  Stress: Stress Concern Present (09/21/2022)   [place]    Feeling of Stress : To some extent  Social Connections: Moderately Integrated (09/21/2022)   Social Connection and Isolation Panel [NHANES]    Frequency of Communication with Friends and Family: More than three times a week    Frequency of Social Gatherings with Friends and Family: More than three times a week    Attends Religious Services: More than 4 times per year    Active Member of [employer] or Organizations: Yes    Attends Music therapist: More than 4 times per year    Marital Status: Divorced  Intimate Partner Violence: Not At Risk (09/21/2022)   Humiliation, Afraid, Rape, and Kick questionnaire    Fear of Current or Ex-Partner: No    Emotionally Abused: No    Physically Abused: No    Sexually Abused: No     Family History  Problem Relation Age of Onset   Stroke Mother    Heart disease Mother        CABG   Cancer Father     ROS: [x]$  Positive   [ ]$  Negative   [ ]$  All sytems reviewed and are negative  Cardiac: []$  chest pain/pressure []$  hx MI []$  SOB   Vascular: []$  pain in legs while walking [x]$  pain in legs at rest [x]$  pain in legs at night [x]$  non-healing ulcers []$  hx of DVT []$  swelling in legs  Pulmonary: []$  asthma/wheezing []$  home O2  Neurologic: []$  hx of CVA []$  mini stroke   Hematologic: []$  hx of cancer  Endocrine:   [x]$  diabetes []$  thyroid disease  GI []$   GERD  GU: []$  CKD/renal failure []$  HD--[]$  M/W/F or []$   T/T/S  Psychiatric: []$  anxiety []$  depression  Musculoskeletal: []$  arthritis []$  joint pain  Integumentary: []$  rashes []$  ulcers  Constitutional: []$  fever  []$  chills  Physical Examination  Vitals:   10/22/22 1219  BP: (!) 145/80  Pulse: 100  Resp: 20  Temp: 98 F (36.7 C)  SpO2: 98%   There is no height or weight on file to calculate BMI.  General:  WDWN in NAD Gait: Not observed HENT: WNL, normocephalic Pulmonary: normal non-labored breathing Cardiac: regular Abdomen:  soft, NT; aortic pulse is not palpable Skin: without rashes Vascular Exam/Pulses:  Right Left  Radial 2+ (normal) 2+ (normal)  Femoral absent 2+ (normal)  DP Unable to palpate Unable to palpate  PT Unable to palpate 2+ (normal)   Extremities: left foot is warm and well perfused with gangrene of left 2nd toe.  Right foot without wounds     Musculoskeletal: no muscle wasting or atrophy  Neurologic: A&O X 3 Psychiatric:  The pt has Normal affect.   CBC    Component Value Date/Time   WBC 7.9 08/03/2022 1030   WBC 10.0 05/05/2021 1231   RBC 4.46 08/03/2022 1030   RBC 3.11 (L) 05/05/2021 1231   HGB 13.6 08/03/2022 1030   HCT 40.1 08/03/2022 1030   PLT 317 08/03/2022 1030   MCV 90 08/03/2022 1030   MCH 30.5 08/03/2022 1030   MCH 30.5 05/05/2021 1231   MCHC 33.9 08/03/2022 1030   MCHC 34.1 05/05/2021 1231   RDW 12.5 08/03/2022 1030   LYMPHSABS 1.8 08/03/2022 1030   MONOABS 0.5 05/05/2021 1231   EOSABS 0.1 08/03/2022 1030   BASOSABS 0.1 08/03/2022 1030    BMET    Component Value Date/Time   NA 134 (L) 08/10/2022 1136   NA 136 08/03/2022 1030   K 4.4 08/10/2022 1136   CL 104 08/10/2022 1136   CO2 24 08/10/2022 1136   GLUCOSE 153 (H) 08/10/2022 1136   BUN 35 (H) 08/10/2022 1136   BUN 37 (H) 08/03/2022 1030   CREATININE 1.55 (H) 08/10/2022 1136   CALCIUM 9.2 08/10/2022 1136   GFRNONAA 46 (L) 08/10/2022 1136   GFRAA 44 (L) 10/17/2020 1434    COAGS: Lab Results  Component  Value Date   INR 1.4 (H) 04/24/2021     Non-Invasive Vascular Imaging:   ABI/TBI 08/11/2021: Right:  0.54/0.52 Great toe pressure: 76 Left:  0.94/amp great toe pressure n/a  LLE arterial duplex 08/11/2021: Left Graft #1: Common femoral to TPT  +--------------------+--------+--------+--------+--------+                     PSV cm/sStenosisWaveformComments  +--------------------+--------+--------+--------+--------+  Inflow             85              biphasic          +--------------------+--------+--------+--------+--------+  Proximal Anastomosis128             biphasic          +--------------------+--------+--------+--------+--------+  Proximal Graft      110             biphasic          +--------------------+--------+--------+--------+--------+  Mid Graft           92              biphasic          +--------------------+--------+--------+--------+--------+  Distal Graft        146             biphasic          +--------------------+--------+--------+--------+--------+  Distal Anastomosis  121             biphasic          +--------------------+--------+--------+--------+--------+  Outflow            136             biphasic          +--------------------+--------+--------+--------+--------+   Aortoiliac duplex 08/11/2021: Abdominal Aorta Findings:  +-------------+-------+----------+----------+--------+--------+--------+  Location    AP (cm)Trans (cm)PSV (cm/s)WaveformThrombusComments  +-------------+-------+----------+----------+--------+--------+--------+  Distal                       178                                 +-------------+-------+----------+----------+--------+--------+--------+  LT CIA Prox                   154       biphasic                  +-------------+-------+----------+----------+--------+--------+--------+  LT CIA Mid                    131       biphasic                   +-------------+-------+----------+----------+--------+--------+--------+  LT CIA Distal                 129       biphasic                  +-------------+-------+----------+----------+--------+--------+--------+  LT EIA Prox                   114       biphasic                  +-------------+-------+----------+----------+--------+--------+--------+  LT EIA Mid                    103       biphasic                  +-------------+-------+----------+----------+--------+--------+--------+  LT EIA Distal                 103       biphasic                  +--    ASSESSMENT/PLAN: This is a 77 y.o. male  with hx of left CIA angioplasty and stent placement, left CFA endarterectomy with profundoplasty and bovine pericardial patch angioplasty, left BK  popliteal artery TP trunk endarterectomy and left CFA to TPT bypass with ipsilateral non reversed GSV, ray amputation of left great toe and debridement of left foot infection with dorsal foot abscess on 05/04/2021 by Dr. Carlis Abbott.    -pt with palpable left PT pulse -pt has been afebrile -will get ABI and LLE arterial duplex to start.  -Dr. Trula Slade to evaluate pt and determine further plan   Leontine Locket, PA-C [facility] [phone]   I agree with the above.  I have seen and evaluated the patient.  The patient has a history of a left  common iliac artery stent, left femoral endarterectomy with bovine pericardial patch angioplasty and left below-knee popliteal and tibial peroneal trunk endarterectomy and subsequent ipsilateral nonreversed saphenous vein bypass graft.  At the time he also had amputation of the left great toe and debridement of the left foot infection for a dorsal foot abscess.  This was performed on 05/04/2021.  He was last seen in the office on 08/11/2021 with a patent bypass graft.  He has developed a new  toe ulceration/gangrene.  This has been treated with antibiotics.  He has been  seen by Dr. Jacqualyn Posey with podiatry who plans on digital amputation.  On exam, the patient has a faintly palpable left posterior tibial pulse.  I suspect that his bypass graft remains patent however we will check noninvasive imaging.  If something comes up abnormal he will need angiography.  If he needs to have the toe removed over the weekend that would be reasonable.  Further plans will be made based off of the ultrasound results.  Annamarie Major

## 2022-10-22 NOTE — Progress Notes (Signed)
Pharmacy Antibiotic Note  DOT HENDEL is a 77 y.o. male admitted on 10/22/2022 with  wound infection .  Pharmacy has been consulted for vanco and cefepime dosing.  Plan: Vancomycin 1500 mg iv x1 as LD f/b 1250 mg IV every 24 hours.  eAUC 496 mcg*hr/mL (Scr utilized 1.55). Cefepime 2 grams iv q12h Monitor renal function and adjust doses as able. F/up on cultures and opportunities to de-escalate. F/up on LOT     Temp (24hrs), Avg:98 F (36.7 C), Min:98 F (36.7 C), Max:98 F (36.7 C)  No results for input(s): "WBC", "CREATININE", "LATICACIDVEN", "VANCOTROUGH", "VANCOPEAK", "VANCORANDOM", "GENTTROUGH", "GENTPEAK", "GENTRANDOM", "TOBRATROUGH", "TOBRAPEAK", "TOBRARND", "AMIKACINPEAK", "AMIKACINTROU", "AMIKACIN" in the last 168 hours.  CrCl cannot be calculated (Patient's most recent lab result is older than the maximum 21 days allowed.).    Allergies  Allergen Reactions   Asa [Aspirin] Hives   Penicillins Hives    Hives + vomiting as an adult  - tolerated Ceftriaxone 04/2021   Tetanus Toxoid     Antimicrobials this admission: cefepime 2/9 >>   vanco 2/9 >>   Flagyl 2/9 >>  Thank you for allowing pharmacy to be a part of this patient's care.  Vaughan Basta BS, PharmD, BCPS Clinical Pharmacist 10/22/2022 1:45 PM  Contact: [phone] after 3 PM  "Be curious, not judgmental..." -Jamal Maes

## 2022-10-22 NOTE — Plan of Care (Signed)

## 2022-10-22 NOTE — Progress Notes (Signed)
This is a 77 year old male with past medical history of atrial fibrillation, hypertension, dyslipidemia, diabetes mellitus.  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  He follows vascular surgery Dr. Monica Martinez and cardiology [facility].   Patient presented to [facility] with darkening of the left second toe.  He has a chronic wound on the left foot.  CT extremity done at [facility] shows: Gangrenous left second digit, with soft tissue swelling, subcutaneous gas, and bony destruction of the second middle and distal phalanges as above. He also has diffuse soft tissue swelling throughout the left foot and ankle. No other foci of subcutaneous gas.   He was started on vancomycin, cefepime and Flagyl.  His creatinine is 2.46.  His baseline creatinine last done here on 08/02/2022 was 1.55 with a GFR of 46.  This is close to baseline.  His white blood count is 18.5.  He is alert and oriented x 3.  Surgeon at [facility] was asked to evaluate the patient, he recommended patient transferred to [facility]  Podiatry on-call Mayo Ao has graciously agreed to see patient once transferred to the hospital.  He request x-ray and MRI and vascular consult on arrival.

## 2022-10-22 NOTE — H&P (Signed)
History and Physical    Todd Zhang [id number] DOB: 1945/12/05 DOA: 10/22/2022  PCP: Patient, [name] (Confirm with patient/family/NH records and if not entered, this has to be entered at [facility] point of entry) Patient coming from: Home  I have personally briefly reviewed patient's old medical records in [place]  Chief Complaint: Left second toe discoloration and swelling  HPI: Todd Zhang is a 77 y.o. male with medical history significant of IDDM, HTN, PVD with status post left common iliac stenting and left common femoral enterectomy/bovine patch and left femoral to TP trunk bypass in 2022, chronic left second toe ulcer, mild aortic stenosis, PAF s/p DCCV in 2016 Xarelto, CKD stage IIIb, came to [facility] ED for evaluation worsening of left second toe discoloration and rash and swelling.  Patient reported that he has a chronic diabetic ulcer wound on left second toe for at least 3-4 months for which he has been following with the local orthopedic surgeon for wound care and received at least 2 rounds of p.o. antibiotics including 1 round of antibiotics about 2 weeks ago.  Despite, he noticed that last 2 weeks, the ulcer on left second toe has grown larger and increasing rash and swelling of the left foot.  Denies any fever chills and no significant discharge noticed.  This week, the color of the left second toe had turned black but no severe pain  ED Course: Patient is afebrile, blood pressure 119/60, borderline tachycardia pulse 100, PC 18, hemoglobin 12, 133, chloride 92, BUN 42, creatinine 2.4 about his baseline, glucose 300  CT showed gangrene of left second digit with soft tissue swelling and subcutaneous gas and will need extraction of second mid and distal phalanges.  Podiatry consulted on the phone and patient was started on vancomycin cefepime and Flagyl and transferred to [facility].  Review of Systems: As per HPI otherwise 14 point review of systems negative.    Past  Medical History:  Diagnosis Date   Atrial fibrillation ([place])    Cervical spinal stenosis    DDD (degenerative disc disease), cervical    DDD (degenerative disc disease), lumbar    Diabetes mellitus without complication ([place])    Dyspnea    Hyperlipidemia    Hypertension    Peripheral artery disease ([place])     Past Surgical History:  Procedure Laterality Date   ABDOMINAL AORTOGRAM W/LOWER EXTREMITY N/A 04/30/2021   Procedure: ABDOMINAL AORTOGRAM W/LOWER EXTREMITY;  Surgeon: Cherre Robins, MD;  Location: [place] [facility];  Service: Cardiovascular;  Laterality: N/A;   AMPUTATION TOE     AMPUTATION TOE Left 05/04/2021   Procedure: LEFT GREAT TOE AMPUTATION;  Surgeon: Marty Heck, MD;  Location: [place];  Service: Vascular;  Laterality: Left;   BYPASS GRAFT FEMORAL-PERONEAL Left 05/04/2021   Procedure: LEFT COMMON FEMORAL-TIBIOPERONEAL TRUNK BYPASS;  Surgeon: Marty Heck, MD;  Location: [place];  Service: Vascular;  Laterality: Left;   ENDARTERECTOMY FEMORAL Left 05/04/2021   Procedure: LEFT COMMON FEMORAL ENDARTERECTOMY WITH BOVINE PATCH;  Surgeon: Marty Heck, MD;  Location: [place];  Service: Vascular;  Laterality: Left;   ENDARTERECTOMY TIBIOPERONEAL Left 05/04/2021   Procedure: LEFT BELOW KNEE [place] WITH  TIBIOPERONEAL ENDARTERECTOMY;  Surgeon: Marty Heck, MD;  Location: [place];  Service: Vascular;  Laterality: Left;   ENDOVEIN HARVEST OF GREATER SAPHENOUS VEIN  05/04/2021   Procedure: HARVEST OF LEFT GREATER SAPHENOUS VEIN;  Surgeon: Marty Heck, MD;  Location: [place];  Service: Vascular;;   INSERTION OF ILIAC STENT Left 05/04/2021   Procedure: LEFT COMMON ILLIAC ATERY ANGIOPLASTY WITH STENT;  Surgeon: Marty Heck, MD;  Location: [place];  Service: Vascular;  Laterality: Left;   INTRAOPERATIVE ARTERIOGRAM Left 05/04/2021   Procedure: LEFT ARTERY ARTERIOGRAM;  Surgeon: Marty Heck, MD;  Location: [place];  Service: Vascular;   Laterality: Left;   IRRIGATION AND DEBRIDEMENT FOOT Left 05/04/2021   Procedure: IRRIGATION AND DEBRIDEMENT FOOT;  Surgeon: Marty Heck, MD;  Location: [place];  Service: Vascular;  Laterality: Left;   KNEE SURGERY Left    NOSE SURGERY     TEE WITHOUT CARDIOVERSION N/A 05/01/2021   Procedure: TRANSESOPHAGEAL ECHOCARDIOGRAM (TEE);  Surgeon: Skeet Latch, MD;  Location: [facility] ENDOSCOPY;  Service: Cardiovascular;  Laterality: N/A;     reports that he quit smoking about 11 years ago. His smoking use included cigarettes. He started smoking about 56 years ago. He smoked an average of 1 pack per day. He has been exposed to tobacco smoke. He has never used smokeless tobacco. He reports that he does not currently use drugs. He reports that he does not drink alcohol.  Allergies  Allergen Reactions   Asa [Aspirin] Hives   Penicillins Hives    Hives + vomiting as an adult  - tolerated Ceftriaxone 04/2021   Tetanus Toxoid     Family History  Problem Relation Age of Onset   Stroke Mother    Heart disease Mother        CABG   Cancer Father      Prior to Admission medications   Medication Sig Start Date End Date Taking? Authorizing Provider  alendronate (FOSAMAX) 70 MG tablet TAKE 1 TABLET BY MOUTH ONCE WEEKLY ON SUNDAY ON ON AN EMPTY STOMACH with full glass of water 06/29/22   [name], [name], FNP  atorvastatin (LIPITOR) 40 MG tablet Take 1 tablet (40 mg total) by mouth daily. 08/03/22   [name], [name], FNP  clopidogrel (PLAVIX) 75 MG tablet Take 1 tablet (75 mg total) by mouth daily. 08/03/22   [name], [name], FNP  ezetimibe (ZETIA) 10 MG tablet Take 1 tablet (10 mg total) by mouth daily. 08/03/22 07/29/23  [name], [name], FNP  fenofibrate (TRICOR) 145 MG tablet Take 1 tablet (145 mg total) by mouth daily. 08/03/22   [name], [name], FNP  furosemide (LASIX) 40 MG tablet Take 1 tablet (40 mg total) by mouth 2 (two) times daily. 08/03/22   [name],  [name], FNP  gabapentin (NEURONTIN) 300 MG capsule Take 1 capsule (300 mg total) by mouth 4 (four) times daily. TAKE 1 CAPSULE BY MOUTH THREE TIMES DAILY FOR PAIN 08/03/22   [name], FNP  glucose blood (ACCU-CHEK AVIVA PLUS) test strip CHECK BLOOD SUGAR FOUR TIMES DAILY Dx E11.40 05/12/22   [name], FNP  insulin regular (HUMULIN R) 100 units/mL injection INJECT 10 UNITS INTO THE SKIN THREE TIMES DAILY BEFORE MEALS 02/04/22   [name], [name], FNP  Insulin Syringe-Needle U-100 (GLOBAL INJECT EASE INSULIN SYR) 31G X 5/16" 0.3 ML MISC USE WITH INSULIN EVERY DAY Dx E11.40 03/25/22   Dettinger, Fransisca Kaufmann, MD  metFORMIN (GLUCOPHAGE) 500 MG tablet Take 1 tablet (500 mg total) by mouth 2 (two) times daily. 08/03/22   [name] [name], FNP  metoprolol tartrate (LOPRESSOR) 25 MG tablet Take 1 tablet (25 mg total) by mouth 2 (two) times daily. 09/10/22 09/05/23  Finis Bud, NP  nitroGLYCERIN (NITROSTAT) 0.4 MG SL tablet DISSOLVE 1 TABLET UNDER THE TONGUE EVERY 5  MINUTES AS NEEDED FOR CHEST PAIN. DO NOT EXCEED A TOTAL OF 3 DOSES IN 15 MINUTES. 09/28/22   Mallipeddi, Vishnu P, MD  pantoprazole (PROTONIX) 40 MG tablet Take 1 tablet (40 mg total) by mouth daily. 08/03/22   [name] [name], FNP  tamsulosin (FLOMAX) 0.4 MG CAPS capsule Take 1 capsule (0.4 mg total) by mouth daily. 08/03/22   [name], FNP    Physical Exam: Vitals:   10/22/22 1219  BP: (!) 145/80  Pulse: 100  Resp: 20  Temp: 98 F (36.7 C)  TempSrc: Oral  SpO2: 98%    Constitutional: NAD, calm, comfortable Vitals:   10/22/22 1219  BP: (!) 145/80  Pulse: 100  Resp: 20  Temp: 98 F (36.7 C)  TempSrc: Oral  SpO2: 98%   Eyes: PERRL, lids and conjunctivae normal ENMT: Mucous membranes are moist. Posterior pharynx clear of any exudate or lesions.Normal dentition.  Neck: normal, supple, no masses, no thyromegaly Respiratory: clear to auscultation bilaterally, no wheezing,  no crackles. Normal respiratory effort. No accessory muscle use.  Cardiovascular: Regular rate and rhythm, no murmurs / rubs / gallops. No extremity edema. 2+ pedal pulses. No carotid bruits.  Abdomen: no tenderness, no masses palpated. No hepatosplenomegaly. Bowel sounds positive.  Musculoskeletal: Significant discoloration of left second toe with extensive soft tissue swelling of second toe and soft tissue of the forefoot no significant tenderness or discharge.  Soft tissue swelling and edema to the mid shin level on left Skin: no rashes, lesions, ulcers. No induration Neurologic: CN 2-12 grossly intact. Sensation intact, DTR normal. Strength 5/5 in all 4.  Psychiatric: Normal judgment and insight. Alert and oriented x 3. Normal mood.    Labs on Admission: I have personally reviewed following labs and imaging studies  CBC: No results for input(s): "WBC", "NEUTROABS", "HGB", "HCT", "MCV", "PLT" in the last 168 hours. Basic Metabolic Panel: No results for input(s): "NA", "K", "CL", "CO2", "GLUCOSE", "BUN", "CREATININE", "CALCIUM", "MG", "PHOS" in the last 168 hours. GFR: CrCl cannot be calculated (Patient's most recent lab result is older than the maximum 21 days allowed.). Liver Function Tests: No results for input(s): "AST", "ALT", "ALKPHOS", "BILITOT", "PROT", "ALBUMIN" in the last 168 hours. No results for input(s): "LIPASE", "AMYLASE" in the last 168 hours. No results for input(s): "AMMONIA" in the last 168 hours. Coagulation Profile: No results for input(s): "INR", "PROTIME" in the last 168 hours. Cardiac Enzymes: No results for input(s): "CKTOTAL", "CKMB", "CKMBINDEX", "TROPONINI" in the last 168 hours. BNP (last 3 results) No results for input(s): "PROBNP" in the last 8760 hours. HbA1C: No results for input(s): "HGBA1C" in the last 72 hours. CBG: No results for input(s): "GLUCAP" in the last 168 hours. Lipid Profile: No results for input(s): "CHOL", "HDL", "LDLCALC", "TRIG",  "CHOLHDL", "LDLDIRECT" in the last 72 hours. Thyroid Function Tests: No results for input(s): "TSH", "T4TOTAL", "FREET4", "T3FREE", "THYROIDAB" in the last 72 hours. Anemia Panel: No results for input(s): "VITAMINB12", "FOLATE", "FERRITIN", "TIBC", "IRON", "RETICCTPCT" in the last 72 hours. Urine analysis:    Component Value Date/Time   COLORURINE YELLOW 04/24/2021 1029   APPEARANCEUR HAZY (A) 04/24/2021 1029   LABSPEC 1.018 04/24/2021 1029   PHURINE 5.0 04/24/2021 1029   GLUCOSEU >=500 (A) 04/24/2021 1029   HGBUR MODERATE (A) 04/24/2021 1029   BILIRUBINUR NEGATIVE 04/24/2021 1029   BILIRUBINUR neg 03/04/2020 1437   KETONESUR NEGATIVE 04/24/2021 1029   PROTEINUR 30 (A) 04/24/2021 1029   UROBILINOGEN negative (A) 03/04/2020 1437   NITRITE NEGATIVE 04/24/2021 1029  LEUKOCYTESUR NEGATIVE 04/24/2021 1029    Radiological Exams on Admission: No results found.  EKG: Independently reviewed. Ordered  Assessment/Plan Principal Problem:   Osteomyelitis (HCC) Active Problems:   Type 2 diabetes mellitus with diabetic neuropathy, with long-term current use of insulin (HCC)   COPD exacerbation (HCC)   Peripheral artery disease (HCC)   CKD (chronic kidney disease) stage 3, GFR 30-59 ml/min (HCC)  (please populate well all problems here in Problem List. (For example, if patient is on BP meds at home and you resume or decide to hold them, it is a problem that needs to be her. Same for CAD, COPD, HLD and so on)  Left second toe dry gangrene, gas gangrene and osteomyelitis -Prognosis poor likely amputation to follow.  Discussed case with on-call vascular surgeon Dr. Trula Slade.  Podiatry Dr. Jacqualyn Posey is on board. -New broad-spectrum antibiotics vancomycin cefepime and Flagyl -Left foot MRI without contrast  Leukocytosis -Likely secondary to the left foot infection and gangrene, lactic acid within normal limits, blood culture sent at [facility] ED, will follow.  Antibiotics as above.  PVD  status post left leg stenting bypass -As mentioned in the HPI, of extensive vascular intervention in 2022 -Vascular surgeon consulted -Continue Plavix and statin  PAF -Given that patient will need podiatry/vascular intervention, hold off Xarelto -Right now patient is in sinus rhythm and risk of stroke is comparably low.  Will not initiate systemic anticoagulation until after surgical intervention completed.  Start chemical DVT prophylaxis.  CKD stage IIIb -Euvolemic, creatinine level stable, can continue Lasix  COPD -Stable, no acute concerns  HTN -Stable, continue Lasix and metoprolol  IDDM with hyperglycemia -Continue Humulin R 10 units 3 times daily AC -Add sliding scale -Discontinue metformin given his CKD stage IIIb status  DVT prophylaxis: Heparin subcu Code Status: Full code Family Communication: None at bedside Disposition Plan: Patient is sick with left foot osteomyelitis and gangrene, expect surgical intervention of amputation, consider PT evaluation after podiatry/vascular surgery intervention, expect more than 2 midnight hospital stay. Consults called: Podiatry, vascular surgery Admission status: MedSurg admission   Lequita Halt MD [facility] Pager [phone]  10/22/2022, 1:31 PM

## 2022-10-23 ENCOUNTER — Inpatient Hospital Stay (HOSPITAL_COMMUNITY): Payer: Medicare HMO

## 2022-10-23 ENCOUNTER — Encounter (HOSPITAL_COMMUNITY): Payer: Self-pay | Admitting: Internal Medicine

## 2022-10-23 ENCOUNTER — Other Ambulatory Visit: Payer: Self-pay

## 2022-10-23 DIAGNOSIS — I160 Hypertensive urgency: Secondary | ICD-10-CM | POA: Diagnosis not present

## 2022-10-23 DIAGNOSIS — L0390 Cellulitis, unspecified: Secondary | ICD-10-CM

## 2022-10-23 DIAGNOSIS — M869 Osteomyelitis, unspecified: Secondary | ICD-10-CM | POA: Diagnosis not present

## 2022-10-23 DIAGNOSIS — I96 Gangrene, not elsewhere classified: Secondary | ICD-10-CM

## 2022-10-23 LAB — CBC
HCT: 30.3 % — ABNORMAL LOW (ref 39.0–52.0)
Hemoglobin: 10.6 g/dL — ABNORMAL LOW (ref 13.0–17.0)
MCH: 30.8 pg (ref 26.0–34.0)
MCHC: 35 g/dL (ref 30.0–36.0)
MCV: 88.1 fL (ref 80.0–100.0)
Platelets: 315 10*3/uL (ref 150–400)
RBC: 3.44 MIL/uL — ABNORMAL LOW (ref 4.22–5.81)
RDW: 12.6 % (ref 11.5–15.5)
WBC: 12.8 10*3/uL — ABNORMAL HIGH (ref 4.0–10.5)
nRBC: 0 % (ref 0.0–0.2)

## 2022-10-23 LAB — BASIC METABOLIC PANEL
Anion gap: 12 (ref 5–15)
BUN: 26 mg/dL — ABNORMAL HIGH (ref 8–23)
CO2: 25 mmol/L (ref 22–32)
Calcium: 8.5 mg/dL — ABNORMAL LOW (ref 8.9–10.3)
Chloride: 95 mmol/L — ABNORMAL LOW (ref 98–111)
Creatinine, Ser: 1.75 mg/dL — ABNORMAL HIGH (ref 0.61–1.24)
GFR, Estimated: 40 mL/min — ABNORMAL LOW (ref >60)
Glucose, Bld: 156 mg/dL — ABNORMAL HIGH (ref 70–99)
Potassium: 3.6 mmol/L (ref 3.5–5.1)
Sodium: 132 mmol/L — ABNORMAL LOW (ref 135–145)

## 2022-10-23 LAB — GLUCOSE, CAPILLARY
Glucose-Capillary: 199 mg/dL — ABNORMAL HIGH (ref 70–99)
Glucose-Capillary: 189 mg/dL — ABNORMAL HIGH (ref 70–99)
Glucose-Capillary: 95 mg/dL (ref 70–99)
Glucose-Capillary: 190 mg/dL — ABNORMAL HIGH (ref 70–99)

## 2022-10-23 LAB — VAS US ABI WITH/WO TBI
Left ABI: 0.65
Right ABI: 0.52

## 2022-10-23 MED ORDER — SENNOSIDES-DOCUSATE SODIUM 8.6-50 MG PO TABS
1.0000 | ORAL_TABLET | Freq: Two times a day (BID) | ORAL | Status: DC
  Administered 2022-10-23 – 2022-10-28 (×8): 1 via ORAL
  Filled 2022-10-23 (×11): qty 1

## 2022-10-23 NOTE — Progress Notes (Signed)
PROGRESS NOTE Todd Zhang  [id number] DOB: Sep 22, 1945 DOA: 10/22/2022 PCP: Todd Zhang, Todd Zhang   Brief Narrative/Hospital Course: 77 y.o. male with medical history significant of IDDM, HTN, PVD with status post left common iliac stenting and left common femoral enterectomy/bovine patch and left femoral to TP trunk bypass in 2022, chronic left second toe ulcer, mild aortic stenosis, PAF s/p DCCV in 2016 Xarelto, CKD stage IIIb, came to [facility] ED for evaluation worsening of left second toe discoloration and rash and swelling. Todd Zhang reported that he has a chronic diabetic ulcer wound on left second toe for at least 3-4 months for which he has been following with the local orthopedic surgeon for wound care and received at least 2 rounds of p.o. antibiotics including 1 round of antibiotics about 2 weeks ago.  Despite, he noticed that last 2 weeks, the ulcer on left second toe has grown larger and increasing rash and swelling of the left foot.  Denies any fever chills and Todd significant discharge noticed.  This week, the color of the left second toe had turned black but Todd severe pain   ED Course: Todd Zhang is afebrile, blood pressure 119/60, borderline tachycardia pulse 100, PC 18, hemoglobin 12, 133, chloride 92, BUN 42, creatinine 2.4 about his baseline, glucose 300 CT showed gangrene of left second digit with soft tissue swelling and subcutaneous gas and will need extraction of second mid and distal phalanges.  Podiatry consulted on the phone and Todd Zhang was started on vancomycin cefepime and Flagyl and transferred to [facility]  Subjective: Seen and examined this morning Todd Zhang reports he feels much better lower leg edema has improved. Asking for pain medication Overnight afebrile, normotensive, on room air Labs showed mild hypokalemia 132 creatinine 1.7 from 1.8, labs with leukocytosis 12.8 anemia 10.6 g.   Assessment and Plan: Principal Problem:   Osteomyelitis ([place]) Active Problems:    Type 2 diabetes mellitus with diabetic neuropathy, with long-term current use of insulin (HCC)   COPD exacerbation (HCC)   Peripheral artery disease (HCC)   CKD (chronic kidney disease) stage 3, GFR 30-59 ml/min (HCC)   Gangrene of the left foot Acute osteomyelitis throughout the second toe, first and third metatarsal heads with early acute osteomyelitis PVD/vasculopath who has had extensive procedure: Vascular surgery podiatry following on cefepime, Flagyl vancomycin along w/ Plavix Lipitor.  Likely needs amputation.  MRI 2/9-showed acute osteomyelitis of second toe extending to the second MTP joint, and other early acute osteomyelitis  History of left common iliac artery stent, left femoral endarterectomy with bovine pericardial patch angioplasty and left below-knee popliteal and tibial peroneal trunk endarterectomy and subsequently ipsilateral nonreversed saphenous vein bypass graft Also had amputation of left great toe and deepening of the left foot infection for an dorsal foot abscess 05/04/2021.  Now with new 2 ulceration/gangrene followed by vascular and podiatry with plan for distal amputation.  Todd Zhang is fairly palpable left posterior tibial pulse and felt that bypass graft remains patent, noninvasive imaging- ABI/VAS [place] ordered to ensure vascular flow  Leukocytosis in the setting of above PAF on Xarelto currently on hold in anticipation of surgery.  Continue to monitor. CKD stage IIIb: Baseline creatinine~1.5-1.8 COPD: Stable Todd exacerbation Hypertension on Lasix and metoprolol.  BP controlled IDDM with uncontrolled hyperglycemia on Humulin R 10 units 3 times daily, SSI metformin on hold.  Continue Neurontin for neuropathy Recent Labs  Lab 10/22/22 1322 10/22/22 1713 10/22/22 2106 10/23/22 0723  GLUCAP  --  188* 239* 199*  HGBA1C  8.8*  --   --   --     Normocytic anemia likely from CKD.  Monitor Recent Labs  Lab 10/23/22 0429  HGB 10.6*  HCT 30.3*   Mild  hyponatremia-monitor  HLD continue fibrates/statin  DVT prophylaxis: heparin injection 5,000 Units Start: 10/22/22 1400 Code Status:   Code Status: Full Code Family Communication: plan of care discussed with Todd Zhang/WIFE ON PHONE Todd Zhang status is: Inpatient because of toe gangrene Level of care: Med-Surg   Dispo: The Todd Zhang is from: HOME W/ WIFE            Anticipated disposition: TBD  Objective: Vitals last 24 hrs: Vitals:   10/22/22 1954 10/23/22 0449 10/23/22 0721 10/23/22 0958  BP: 124/71 128/64 (!) 110/54 116/64  Pulse: 100 98 100   Resp: 18 16 19   $ Temp: 98.3 F (36.8 C) 98.6 F (37 C) 98 F (36.7 C)   TempSrc: Oral Oral Oral   SpO2: 96% 94% 97%    Weight change:   Physical Examination: General exam: alert awake, older than stated age HEENT:Oral mucosa moist, Ear/Nose WNL grossly Respiratory system: bilaterally clear BS, Todd use of accessory muscle Cardiovascular system: S1 & S2 +, Todd JVD. Gastrointestinal system: Abdomen soft,NT,ND, BS+ Nervous System:Alert, awake, moving extremities. Extremities: LE edema neg, distal foot warm-see gangrenous toe as below Skin: Todd rashes,Todd icterus. MSK: Normal muscle bulk,tone, power  Medications reviewed:  Scheduled Meds:  acidophilus  1 capsule Oral TID   atorvastatin  40 mg Oral Daily   clopidogrel  75 mg Oral Daily   ezetimibe  10 mg Oral Daily   fenofibrate  54 mg Oral Daily   furosemide  40 mg Oral BID   gabapentin  300 mg Oral QID   heparin  5,000 Units Subcutaneous Q8H   insulin aspart  0-5 Units Subcutaneous QHS   insulin aspart  0-9 Units Subcutaneous TID WC   insulin aspart  10 Units Subcutaneous TID WC   metoprolol tartrate  25 mg Oral BID   pantoprazole  40 mg Oral Daily   senna-docusate  1 tablet Oral BID   tamsulosin  0.4 mg Oral Daily   Continuous Infusions:  ceFEPime (MAXIPIME) IV 2 g (10/23/22 0200)   metronidazole 500 mg (10/23/22 0536)   vancomycin        Diet Order             Diet heart  healthy/carb modified Room service appropriate? Yes; Fluid consistency: Thin  Diet effective now                  Intake/Output Summary (Last 24 hours) at 10/23/2022 1120 Last data filed at 10/23/2022 [id number] Gross Zhang 24 hour  Intake 320 ml  Output 1150 ml  Net -830 ml   Net IO Since Admission: -830 mL [10/23/22 1120]  Wt Readings from Last 3 Encounters:  10/04/22 99.1 kg  09/24/22 96.7 kg  09/10/22 97.5 kg     Unresulted Labs (From admission, onward)     Start     Ordered   10/24/22 [id number]  Basic metabolic panel  Daily,   R     Question:  Specimen collection method  Answer:  Lab=Lab collect   10/23/22 0758   10/24/22 0500  CBC  Daily,   R     Question:  Specimen collection method  Answer:  Lab=Lab collect   10/23/22 0758          Data Reviewed: I have personally reviewed following labs  and imaging studies CBC: Recent Labs  Lab 10/23/22 0429  WBC 12.8*  HGB 10.6*  HCT 30.3*  MCV 88.1  PLT 123456   Basic Metabolic Panel: Recent Labs  Lab 10/22/22 1333 10/23/22 0429  NA 135 132*  K 3.9 3.6  CL 94* 95*  CO2 27 25  GLUCOSE 183* 156*  BUN 28* 26*  CREATININE 1.88* 1.75*  CALCIUM 9.2 8.5*   Recent Labs    10/22/22 1322  HGBA1C 8.8*  CBG: Recent Labs  Lab 10/22/22 1713 10/22/22 2106 10/23/22 0723  GLUCAP 188* 239* 199*  Todd results found for this or any previous visit (from the past 240 hour(s)).  Antimicrobials: Anti-infectives (From admission, onward)    Start     Dose/Rate Route Frequency Ordered Stop   10/23/22 1600  vancomycin (VANCOREADY) IVPB 1250 mg/250 mL        1,250 mg 166.7 mL/hr over 90 Minutes Intravenous Every 24 hours 10/22/22 1343     10/22/22 1600  metroNIDAZOLE (FLAGYL) IVPB 500 mg        500 mg 100 mL/hr over 60 Minutes Intravenous Every 12 hours 10/22/22 1334     10/22/22 1430  ceFEPIme (MAXIPIME) 2 g in sodium chloride 0.9 % 100 mL IVPB        2 g 200 mL/hr over 30 Minutes Intravenous Every 12 hours 10/22/22 1339     10/22/22  1430  vancomycin (VANCOREADY) IVPB 1500 mg/300 mL        1,500 mg 150 mL/hr over 120 Minutes Intravenous  Once 10/22/22 1340 10/22/22 1854      Culture/Microbiology    Component Value Date/Time   SDES BONE 05/04/2021 1803   SPECREQUEST LEFT RAY TOE 05/04/2021 1803   CULT  05/04/2021 1803    RARE VANCOMYCIN RESISTANT ENTEROCOCCUS CRITICAL RESULT CALLED TO, READ BACK BY AND VERIFIED WITH: RN T.HARRIS AT [id number] ON 05/06/2021 BY T.SAAD. Todd ANAEROBES ISOLATED Performed at [facility], [place] [address], [place], [place] [address]    REPTSTATUS 05/10/2021 FINAL 05/04/2021 1803  Radiology Studies: MR FOOT LEFT WO CONTRAST  Result Date: 10/22/2022 CLINICAL DATA:  Osteomyelitis, foot EXAM: MRI OF THE LEFT FOOT WITHOUT CONTRAST TECHNIQUE: Multiplanar, multisequence MR imaging of the left forefoot was performed. Todd intravenous contrast was administered. COMPARISON:  X-ray 10/21/2022 FINDINGS: Bones/Joint/Cartilage Destructive changes of the middle and distal phalanx of the second toe are compatible with acute osteomyelitis. Extensive bone marrow edema with intermediate to low T1 signal throughout the proximal phalanx of the second toe is also compatible with osteomyelitis. Mild subchondral marrow edema within the second metatarsal head may be degenerative or reflect early changes of osteomyelitis. Prior great toe and third toe amputation at the MTP joint level. Bone marrow edema within the first and third metatarsal heads without definite confluent low T1 signal change, nonspecific but may represent early acute osteomyelitis. There is also bone marrow edema within the distal phalanx of the fourth toe which may reflect contusion, reactive osteitis or early osteomyelitis. Todd fracture or dislocation. Advanced degenerative changes of the second MTP joint. Ligaments Todd acute collateral ligament injury. Muscles and Tendons Amputation changes of the musculotendinous structures. Diffuse changes of denervation  and/or myositis. Mild flexor tenosynovitis of the second toe. Soft tissues Prominent soft tissue swelling with irregularity at the second toe distally. There is also prominent soft tissue swelling distal to the first metatarsal head. Todd organized or drainable fluid collections. IMPRESSION: 1. Acute osteomyelitis throughout the second toe extending to the second  MTP joint level. Mild subchondral marrow edema within the second metatarsal head may be degenerative or reflect early changes of osteomyelitis. 2. Bone marrow edema within the first and third metatarsal heads are nonspecific but may represent early acute osteomyelitis. 3. Bone marrow edema within the distal phalanx of the fourth toe which may reflect contusion, reactive osteitis, or early osteomyelitis. 4. Mild flexor tenosynovitis of the second toe. Electronically Signed   By: Davina Poke D.O.   On: 10/22/2022 20:35    LOS: 1 day  Antonieta Pert, MD [facility]  10/23/2022, 11:20 AM

## 2022-10-23 NOTE — Progress Notes (Addendum)
   PODIATRY PROGRESS NOTE  NAME Todd Zhang MRN [id number] DOB 07-01-1946 DOA 10/22/2022   Patient seen at bedside this afternoon.  Currently the ischemia to the toe with surrounding cellulitis appears very stable.  Vascular studies are pending.  From a podiatry standpoint and in regards to amputation, will plan to wait until vascular has fully evaluated ultrasound studies and determined that the patient has been optimized vascularly prior to any amputation.  Patient understands and is amenable for this plan.  No emergent need for amputation over the weekend.  Will continue to follow and schedule amputation surgery after vascular optimization.   Please contact me directly via secure chat with any questions or concerns.     Edrick Kins, DPM [facility]  Dr. Edrick Kins, DPM    [address], [place] [address]                Office [phone]  Fax [phone]

## 2022-10-23 NOTE — Hospital Course (Addendum)
77 y.o. male with medical history significant of IDDM, HTN, PVD with status post left common iliac stenting and left common femoral enterectomy/bovine patch and left femoral to TP trunk bypass in 2022, chronic left second toe ulcer, mild aortic stenosis, PAF s/p DCCV in 2016 Xarelto, CKD stage IIIb, came to [facility] ED for evaluation worsening of left second toe discoloration and rash and swelling. Patient reported that he has a chronic diabetic ulcer wound on left second toe for at least 3-4 months for which he has been following with the local orthopedic surgeon for wound care and received at least 2 rounds of p.o. antibiotics including 1 round of antibiotics about 2 weeks ago.  Despite, he noticed that last 2 weeks, the ulcer on left second toe has grown larger and increasing rash and swelling of the left foot.  Denies any fever chills and no significant discharge noticed.  This week, the color of the left second toe had turned black but no severe pain   ED Course: Patient is afebrile, blood pressure 119/60, borderline tachycardia pulse 100, PC 18, hemoglobin 12, 133, chloride 92, BUN 42, creatinine 2.4 about his baseline, glucose 300, CT showed gangrene of left second digit with soft tissue swelling and subcutaneous gas and osteomyelitis of second mid and distal phalanges.  Podiatry/vascular consulted, admitted on vancomycin cefepime and Flagyl and transferred to [facility] for  gangrenous changes to the single left toe.  2/10: S/p bypass duplex revealed monophasic waveform and a stenosis at the distal anastomosis and vascular surgery recommended angiography 2/12, podiatry following closely for possible amputation after vascular optimization.

## 2022-10-23 NOTE — Progress Notes (Signed)
VASCULAR LAB    ABI has been performed.  See CV proc for preliminary results.   Faith Patricelli, RVT 10/23/2022, 4:18 PM

## 2022-10-24 DIAGNOSIS — M869 Osteomyelitis, unspecified: Secondary | ICD-10-CM | POA: Diagnosis not present

## 2022-10-24 LAB — CBC
HCT: 31.4 % — ABNORMAL LOW (ref 39.0–52.0)
Hemoglobin: 10.7 g/dL — ABNORMAL LOW (ref 13.0–17.0)
MCH: 30.1 pg (ref 26.0–34.0)
MCHC: 34.1 g/dL (ref 30.0–36.0)
MCV: 88.2 fL (ref 80.0–100.0)
Platelets: 307 10*3/uL (ref 150–400)
RBC: 3.56 MIL/uL — ABNORMAL LOW (ref 4.22–5.81)
RDW: 12.5 % (ref 11.5–15.5)
WBC: 9.6 10*3/uL (ref 4.0–10.5)
nRBC: 0 % (ref 0.0–0.2)

## 2022-10-24 LAB — GLUCOSE, CAPILLARY
Glucose-Capillary: 132 mg/dL — ABNORMAL HIGH (ref 70–99)
Glucose-Capillary: 120 mg/dL — ABNORMAL HIGH (ref 70–99)
Glucose-Capillary: 161 mg/dL — ABNORMAL HIGH (ref 70–99)
Glucose-Capillary: 131 mg/dL — ABNORMAL HIGH (ref 70–99)

## 2022-10-24 LAB — BASIC METABOLIC PANEL
Anion gap: 11 (ref 5–15)
BUN: 26 mg/dL — ABNORMAL HIGH (ref 8–23)
CO2: 25 mmol/L (ref 22–32)
Calcium: 8.7 mg/dL — ABNORMAL LOW (ref 8.9–10.3)
Chloride: 97 mmol/L — ABNORMAL LOW (ref 98–111)
Creatinine, Ser: 1.65 mg/dL — ABNORMAL HIGH (ref 0.61–1.24)
GFR, Estimated: 43 mL/min — ABNORMAL LOW (ref >60)
Glucose, Bld: 136 mg/dL — ABNORMAL HIGH (ref 70–99)
Potassium: 3.8 mmol/L (ref 3.5–5.1)
Sodium: 133 mmol/L — ABNORMAL LOW (ref 135–145)

## 2022-10-24 MED ORDER — HYDROXYZINE HCL 10 MG PO TABS: 10.0000 mg | ORAL_TABLET | Freq: Three times a day (TID) | ORAL | Status: DC | PRN

## 2022-10-24 MED ORDER — HYDROCORTISONE 1 % EX CREA
TOPICAL_CREAM | Freq: Two times a day (BID) | CUTANEOUS | Status: DC | PRN
  Filled 2022-10-24 (×2): qty 28

## 2022-10-24 MED ORDER — SODIUM CHLORIDE 0.9 % IV SOLN: INTRAVENOUS | Status: AC

## 2022-10-24 MED ORDER — FUROSEMIDE 40 MG PO TABS
40.0000 mg | ORAL_TABLET | Freq: Two times a day (BID) | ORAL | Status: DC
  Administered 2022-10-26 – 2022-10-28 (×5): 40 mg via ORAL
  Filled 2022-10-24 (×5): qty 1

## 2022-10-24 NOTE — Progress Notes (Addendum)
Todd Zhang  [id number] DOB: 02-25-1946 DOA: 10/22/2022 PCP: Patient, [name]   Brief Narrative/Hospital Course: 77 y.o. male with medical history significant of IDDM, HTN, PVD with status post left common iliac stenting and left common femoral enterectomy/bovine patch and left femoral to TP trunk bypass in 2022, chronic left second toe ulcer, mild aortic stenosis, PAF s/p DCCV in 2016 Xarelto, CKD stage IIIb, came to [facility] ED for evaluation worsening of left second toe discoloration and rash and swelling. Patient reported that he has a chronic diabetic ulcer wound on left second toe for at least 3-4 months for which he has been following with the local orthopedic surgeon for wound care and received at least 2 rounds of p.o. antibiotics including 1 round of antibiotics about 2 weeks ago.  Despite, he noticed that last 2 weeks, the ulcer on left second toe has grown larger and increasing rash and swelling of the left foot.  Denies any fever chills and no significant discharge noticed.  This week, the color of the left second toe had turned black but no severe pain   ED Course: Patient is afebrile, blood pressure 119/60, borderline tachycardia pulse 100, PC 18, hemoglobin 12, 133, chloride 92, BUN 42, creatinine 2.4 about his baseline, glucose 300 CT showed gangrene of left second digit with soft tissue swelling and subcutaneous gas and will need extraction of second mid and distal phalanges.  Podiatry consulted on the phone and patient was started on vancomycin cefepime and Flagyl and transferred to [facility]  Subjective: Patient seen and examined this morning. Asking for hydrocortisone and medication for itching No new complaints Overnight patient has been afebrile, normotensive, labs showed creatinine slightly better 1.6 leukocytosis resolved   Assessment and Plan: Principal Problem:   Osteomyelitis ([place]) Active Problems:   Type 2 diabetes mellitus with diabetic  neuropathy, with long-term current use of insulin (HCC)   COPD exacerbation (HCC)   Peripheral artery disease (HCC)   CKD (chronic kidney disease) stage 3, GFR 30-59 ml/min (HCC)   Gangrene of the left  single toe Acute osteomyelitis-second toe, first and third metatarsal heads with early acute osteomyelitis PVD/vasculopath who has had extensive procedure: Vascular surgery podiatry following.MRI 2/9-showed acute osteomyelitis of second toe extending to the second MTP joint, and other early acute osteomyelitis.  Lower extremity ABI and arterial duplex shows resting right ABI w/ moderate right lower extremity arterial disease, right toe brachial index abnormal, resting left ABI w/ moderate left lower extremity arterial disease-await vascular surgery input. Cont cefepime, Flagyl and vancomycin. Cont  Plavix Lipitor. He will likley  need amputation.[name]  PVD w/ history of left common iliac artery stent, left femoral endarterectomy with bovine pericardial patch angioplasty and left below-knee popliteal and tibial peroneal trunk endarterectomy and subsequently ipsilateral nonreversed saphenous vein bypass graft Also had amputation of left great toe -see above.  Leukocytosis in the setting of above-resolved. PAF on Xarelto currently on hold in anticipation of surgery.cont metoprolol Continue metoprolol, anxietyCKD stage IIIb: Baseline creatinine~1.5-1.8-stable-improving. Recent Labs    10/30/21 0849 02/04/22 1351 05/07/22 1142 07/23/22 1506 08/03/22 1030 08/10/22 1136 10/22/22 1333 10/23/22 0429 10/24/22 0338  BUN 34* 25 33* 42* 37* 35* 28* 26* 26*  CREATININE 1.75* 2.01* 1.83* 2.19* 1.68* 1.55* 1.88* 1.75* 1.65*    COPD: Stable not in exacerbation  Hypertension: Well-controlled on Lasix 40 twice daily, And metoprolol.  BP controlled  CKD IIIb:b/l creat ~ 1.5-1.8, renal function about the same add gentle IV rehydration in  preparation for angiogram Recent Labs    10/30/21 0849  02/04/22 1351 05/07/22 1142 07/23/22 1506 08/03/22 1030 08/10/22 1136 10/22/22 1333 10/23/22 0429 10/24/22 0338  BUN 34* 25 33* 42* 37* 35* 28* 26* 26*  CREATININE 1.75* 2.01* 1.83* 2.19* 1.68* 1.55* 1.88* 1.75* 1.65*    IDDM with uncontrolled hyperglycemia on Humulin R 10 units 3 times daily, SSI metformin on hold.  Continue Neurontin for neuropathy Recent Labs  Lab 10/22/22 1322 10/22/22 1713 10/22/22 2106 10/23/22 0723 10/23/22 1127 10/23/22 1711 10/23/22 1937  GLUCAP  --    < > 239* 199* 189* 95 190*  HGBA1C 8.8*  --   --   --   --   --   --    < > = values in this interval not displayed.     Normocytic anemia likely from CKD.  Monitor Mild hyponatremia-monitor  HLD continue fibrates/statin  DVT prophylaxis: heparin injection 5,000 Units Start: 10/22/22 1400 Code Status:   Code Status: Full Code Family Communication: plan of care discussed with patient/WIFE ON PHONE 2/10 Patient status is: Inpatient because of toe gangrene Level of care: Med-Surg   Dispo: The patient is from: HOME W/ WIFE            Anticipated disposition: TBD  Objective: Vitals last 24 hrs: Vitals:   10/23/22 1300 10/23/22 1357 10/23/22 2005 10/24/22 0321  BP:  130/67 127/71 125/75  Pulse:  77 97 98  Resp:  18 17 18  $ Temp:  98 F (36.7 C) 98.1 F (36.7 C) 97.9 F (36.6 C)  TempSrc:  Oral Oral   SpO2:  96% 100% 97%  Weight: 99.5 kg      Weight change:   Physical Examination: General exam: AA O X.3, obese, pleasant, weak,older appearing HEENT:Oral mucosa moist, Ear/Nose WNL grossly, dentition normal. Respiratory system: bilaterally clear BS,no use of accessory muscle Cardiovascular system: S1 & S2 +, regular rate. Gastrointestinal system: Abdomen soft, NT,ND,BS+ Nervous System:Alert, awake, moving extremities and grossly nonfocal Extremities: LE ankle edema neg, foot toe w/ black discoloration see picture Skin: No rashes,no icterus. MSK: Normal muscle bulk,tone, power    Medications reviewed:  Scheduled Meds:  acidophilus  1 capsule Oral TID   atorvastatin  40 mg Oral Daily   clopidogrel  75 mg Oral Daily   ezetimibe  10 mg Oral Daily   fenofibrate  54 mg Oral Daily   furosemide  40 mg Oral BID   gabapentin  300 mg Oral QID   heparin  5,000 Units Subcutaneous Q8H   insulin aspart  0-5 Units Subcutaneous QHS   insulin aspart  0-9 Units Subcutaneous TID WC   insulin aspart  10 Units Subcutaneous TID WC   metoprolol tartrate  25 mg Oral BID   pantoprazole  40 mg Oral Daily   senna-docusate  1 tablet Oral BID   tamsulosin  0.4 mg Oral Daily   Continuous Infusions:  ceFEPime (MAXIPIME) IV Stopped (10/24/22 0345)   metronidazole 500 mg (10/24/22 [id number])   vancomycin Stopped (10/23/22 2149)      Diet Order             Diet heart healthy/carb modified Room service appropriate? Yes; Fluid consistency: Thin  Diet effective now                  Intake/Output Summary (Last 24 hours) at 10/24/2022 0833 Last data filed at 10/24/2022 0345 Gross per 24 hour  Intake 643.07 ml  Output 1200 ml  Net -  556.93 ml    Net IO Since Admission: -1,386.93 mL [10/24/22 0833]  Wt Readings from Last 3 Encounters:  10/23/22 99.5 kg  10/04/22 99.1 kg  09/24/22 96.7 kg     Unresulted Labs (From admission, onward)     Start     Ordered   10/24/22 [id number]  Basic metabolic panel  Daily,   R     Question:  Specimen collection method  Answer:  Lab=Lab collect   10/23/22 0758   10/24/22 0500  CBC  Daily,   R     Question:  Specimen collection method  Answer:  Lab=Lab collect   10/23/22 0758          Data Reviewed: I have personally reviewed following labs and imaging studies CBC: Recent Labs  Lab 10/23/22 0429 10/24/22 0338  WBC 12.8* 9.6  HGB 10.6* 10.7*  HCT 30.3* 31.4*  MCV 88.1 88.2  PLT 315 [id number]    Basic Metabolic Panel: Recent Labs  Lab 10/22/22 1333 10/23/22 0429 10/24/22 0338  NA 135 132* 133*  K 3.9 3.6 3.8  CL 94* 95* 97*  CO2 27  25 25  $ GLUCOSE 183* 156* 136*  BUN 28* 26* 26*  CREATININE 1.88* 1.75* 1.65*  CALCIUM 9.2 8.5* 8.7*    Recent Labs    10/22/22 1322  HGBA1C 8.8*   CBG: Recent Labs  Lab 10/22/22 2106 10/23/22 0723 10/23/22 1127 10/23/22 1711 10/23/22 1937  GLUCAP 239* 199* 189* 95 190*   No results found for this or any previous visit (from the past 240 hour(s)).  Antimicrobials: Anti-infectives (From admission, onward)    Start     Dose/Rate Route Frequency Ordered Stop   10/23/22 1600  vancomycin (VANCOREADY) IVPB 1250 mg/250 mL        1,250 mg 166.7 mL/hr over 90 Minutes Intravenous Every 24 hours 10/22/22 1343     10/22/22 1600  metroNIDAZOLE (FLAGYL) IVPB 500 mg        500 mg 100 mL/hr over 60 Minutes Intravenous Every 12 hours 10/22/22 1334     10/22/22 1430  ceFEPIme (MAXIPIME) 2 g in sodium chloride 0.9 % 100 mL IVPB        2 g 200 mL/hr over 30 Minutes Intravenous Every 12 hours 10/22/22 1339     10/22/22 1430  vancomycin (VANCOREADY) IVPB 1500 mg/300 mL        1,500 mg 150 mL/hr over 120 Minutes Intravenous  Once 10/22/22 1340 10/22/22 1854      Culture/Microbiology    Component Value Date/Time   SDES BONE 05/04/2021 1803   SPECREQUEST LEFT RAY TOE 05/04/2021 1803   CULT  05/04/2021 1803    RARE VANCOMYCIN RESISTANT ENTEROCOCCUS CRITICAL RESULT CALLED TO, READ BACK BY AND VERIFIED WITH: RN T.HARRIS AT [id number] ON 05/06/2021 BY T.SAAD. NO ANAEROBES ISOLATED Performed at [facility], [place] [address] [place], [place] [address]    REPTSTATUS 05/10/2021 FINAL 05/04/2021 1803  Radiology Studies: VAS [place] LOWER EXTREMITY ARTERIAL DUPLEX  Result Date: 10/23/2022 LOWER EXTREMITY ARTERIAL DUPLEX STUDY Patient Name:  PARSON CARSON  Date of Exam:   10/23/2022 Medical Rec #: [id number]      Accession #:    [id number] Date of Birth: Nov 09, 1945     Patient Gender: M Patient Age:   12 years Exam Location:  [facility] Procedure:      VAS [place] LOWER EXTREMITY ARTERIAL DUPLEX  Referring Phys: Aldona Bar RHYNE --------------------------------------------------------------------------------  High Risk Factors: Hypertension, hyperlipidemia, Diabetes,  past history of                    smoking.  Vascular Interventions: 05/04/2021: Left common iliac artery angioplasty with                         stent placement. Left common femoral endarterectomy with                         profundaplasty and angioplasty. Left below-knee                         popliteal artery and TPT endarterectomy. Left                         common femoral to TPT bypass graft. Left great toe                         amputation. Current ABI:            R:0.52 L:0.65 Comparison Study: Prior study done 08/11/21 Performing Technologist: Sharion Dove RVS  Examination Guidelines: A complete evaluation includes B-mode imaging, spectral Doppler, color Doppler, and power Doppler as needed of all accessible portions of each vessel. Bilateral testing is considered an integral part of a complete examination. Limited examinations for reoccurring indications may be performed as noted.   Left Graft #1: Common femoral to TPT bypass graft +--------------------+--------+---------------+----------+--------------+                     PSV cm/sStenosis       Waveform  Comments       +--------------------+--------+---------------+----------+--------------+ Inflow              44                     monophasic               +--------------------+--------+---------------+----------+--------------+ Proximal Anastomosis123                    monophasic               +--------------------+--------+---------------+----------+--------------+ Proximal Graft      85                     monophasic               +--------------------+--------+---------------+----------+--------------+ Mid Graft           76                     monophasic               +--------------------+--------+---------------+----------+--------------+  Distal Graft        97                     monophasic               +--------------------+--------+---------------+----------+--------------+ Distal Anastomosis  193     50-70% stenosismonophasicturbulent flow +--------------------+--------+---------------+----------+--------------+ Outflow             131                    monophasic               +--------------------+--------+---------------+----------+--------------+   Summary: Left: Since prior study done  08/11/21, waveforms in the bypass graft have changed from biphasic to monophasic and demonstrates a 50-70% stenosis at the distal anastomosis.  See table(s) above for measurements and observations. Electronically signed by Harold Barban MD on 10/23/2022 at 4:51:08 PM.    Final    VAS [place] ABI WITH/WO TBI  Result Date: 10/23/2022  [place] STUDY Patient Name:  KELLE TRUBY  Date of Exam:   10/23/2022 Medical Rec #: [id number]      Accession #:    [id number] Date of Birth: Dec 22, 1945     Patient Gender: M Patient Age:   67 years Exam Location:  [facility] Procedure:      VAS [place] ABI WITH/WO TBI Referring Phys: Aldona Bar RHYNE --------------------------------------------------------------------------------  Indications: Ulceration, gangrene, and peripheral artery disease. High Risk         Hypertension, hyperlipidemia, Diabetes, past history of Factors:          smoking.  Vascular Interventions: 05/04/2021: Left common iliac artery angioplasty                         with stent placement. Left common femoral                         endarterectomy with profundaplasty and angioplasty. Left                         below-knee                         popliteal artery and TPT endarterectomy. Left common                         femoral to TPT bypass graft. Left great toe amputation. Limitations: Today's exam was limited due to arrythmia. Comparison Study: Prior ABI done 08/11/21 Performing Technologist: Sharion Dove RVS   Examination Guidelines: A complete evaluation includes at minimum, Doppler waveform signals and systolic blood pressure reading at the level of bilateral brachial, anterior tibial, and posterior tibial arteries, when vessel segments are accessible. Bilateral testing is considered an integral part of a complete examination. Photoelectric Plethysmograph (PPG) waveforms and toe systolic pressure readings are included as required and additional duplex testing as needed. Limited examinations for reoccurring indications may be performed as noted.  ABI Findings: +---------+------------------+-----+----------+--------+ Right    Rt Pressure (mmHg)IndexWaveform  Comment  +---------+------------------+-----+----------+--------+ Brachial 115                    triphasic          +---------+------------------+-----+----------+--------+ PTA      46                0.38 monophasic         +---------+------------------+-----+----------+--------+ DP       64                0.52 monophasic         +---------+------------------+-----+----------+--------+ Great Toe27                0.22                    +---------+------------------+-----+----------+--------+ +--------+------------------+-----+----------+-------+ Left    Lt Pressure (mmHg)IndexWaveform  Comment +--------+------------------+-----+----------+-------+ [id number]                    triphasic         +--------+------------------+-----+----------+-------+  PTA     79                0.65 monophasic        +--------+------------------+-----+----------+-------+ DP      66                0.54 monophasic        +--------+------------------+-----+----------+-------+ +-------+-----------+-----------+------------+------------+ ABI/TBIToday's ABIToday's TBIPrevious ABIPrevious TBI +-------+-----------+-----------+------------+------------+ Right  0.52       0.22       0.54        0.52          +-------+-----------+-----------+------------+------------+ Left   0.65       amputation 0.94        amputation   +-------+-----------+-----------+------------+------------+ Right TBI appears decreased and ABIs appear essentially unchanged compared to prior study on 08/11/21. Left ABIs appear decreased compared to prior study on 08/11/21.  Summary: Right: Resting right ankle-brachial index indicates moderate right lower extremity arterial disease. The right toe-brachial index is abnormal. Left: Resting left ankle-brachial index indicates moderate left lower extremity arterial disease. *See table(s) above for measurements and observations.  Electronically signed by Harold Barban MD on 10/23/2022 at 4:50:07 PM.    Final    MR FOOT LEFT WO CONTRAST  Result Date: 10/22/2022 CLINICAL DATA:  Osteomyelitis, foot EXAM: MRI OF THE LEFT FOOT WITHOUT CONTRAST TECHNIQUE: Multiplanar, multisequence MR imaging of the left forefoot was performed. No intravenous contrast was administered. COMPARISON:  X-ray 10/21/2022 FINDINGS: Bones/Joint/Cartilage Destructive changes of the middle and distal phalanx of the second toe are compatible with acute osteomyelitis. Extensive bone marrow edema with intermediate to low T1 signal throughout the proximal phalanx of the second toe is also compatible with osteomyelitis. Mild subchondral marrow edema within the second metatarsal head may be degenerative or reflect early changes of osteomyelitis. Prior great toe and third toe amputation at the MTP joint level. Bone marrow edema within the first and third metatarsal heads without definite confluent low T1 signal change, nonspecific but may represent early acute osteomyelitis. There is also bone marrow edema within the distal phalanx of the fourth toe which may reflect contusion, reactive osteitis or early osteomyelitis. No fracture or dislocation. Advanced degenerative changes of the second MTP joint. Ligaments No acute collateral  ligament injury. Muscles and Tendons Amputation changes of the musculotendinous structures. Diffuse changes of denervation and/or myositis. Mild flexor tenosynovitis of the second toe. Soft tissues Prominent soft tissue swelling with irregularity at the second toe distally. There is also prominent soft tissue swelling distal to the first metatarsal head. No organized or drainable fluid collections. IMPRESSION: 1. Acute osteomyelitis throughout the second toe extending to the second MTP joint level. Mild subchondral marrow edema within the second metatarsal head may be degenerative or reflect early changes of osteomyelitis. 2. Bone marrow edema within the first and third metatarsal heads are nonspecific but may represent early acute osteomyelitis. 3. Bone marrow edema within the distal phalanx of the fourth toe which may reflect contusion, reactive osteitis, or early osteomyelitis. 4. Mild flexor tenosynovitis of the second toe. Electronically Signed   By: Davina Poke D.O.   On: 10/22/2022 20:35    LOS: 2 days  Antonieta Pert, MD [facility]  10/24/2022, 8:33 AM

## 2022-10-24 NOTE — Progress Notes (Addendum)
Progress Note    10/24/2022 8:31 AM Hospital Day 2  Subjective:  says he has been elevating his foot to help with swelling and feels that is better.  Only hurts every now and then to elevate.   afebrile  Vitals:   10/23/22 2005 10/24/22 0321  BP: 127/71 125/75  Pulse: 97 98  Resp: 17 18  Temp: 98.1 F (36.7 C) 97.9 F (36.6 C)  SpO2: 100% 97%    Physical Exam: General:  sleeping, wakes easily; no distress Lungs:  non labored Extremities:  unable to palpate left PT pulse today but left foot is warm and well perfused with sensory in tact.  He is able to wiggle the 4th and 5th toes.    CBC    Component Value Date/Time   WBC 9.6 10/24/2022 0338   RBC 3.56 (L) 10/24/2022 0338   HGB 10.7 (L) 10/24/2022 0338   HGB 13.6 08/03/2022 1030   HCT 31.4 (L) 10/24/2022 0338   HCT 40.1 08/03/2022 1030   PLT 307 10/24/2022 0338   PLT 317 08/03/2022 1030   MCV 88.2 10/24/2022 0338   MCV 90 08/03/2022 1030   MCH 30.1 10/24/2022 0338   MCHC 34.1 10/24/2022 0338   RDW 12.5 10/24/2022 0338   RDW 12.5 08/03/2022 1030   LYMPHSABS 1.8 08/03/2022 1030   MONOABS 0.5 05/05/2021 1231   EOSABS 0.1 08/03/2022 1030   BASOSABS 0.1 08/03/2022 1030    BMET    Component Value Date/Time   NA 133 (L) 10/24/2022 0338   NA 136 08/03/2022 1030   K 3.8 10/24/2022 0338   CL 97 (L) 10/24/2022 0338   CO2 25 10/24/2022 0338   GLUCOSE 136 (H) 10/24/2022 0338   BUN 26 (H) 10/24/2022 0338   BUN 37 (H) 08/03/2022 1030   CREATININE 1.65 (H) 10/24/2022 0338   CALCIUM 8.7 (L) 10/24/2022 0338   GFRNONAA 43 (L) 10/24/2022 0338   GFRAA 44 (L) 10/17/2020 1434    INR    Component Value Date/Time   INR 1.4 (H) 04/24/2021 1041     Intake/Output Summary (Last 24 hours) at 10/24/2022 0831 Last data filed at 10/24/2022 0345 Gross per 24 hour  Intake 643.07 ml  Output 1200 ml  Net -556.93 ml   LLE arterial duplex and ABI 10/23/2022: Left Graft #1: Common femoral to TPT bypass graft   +--------------------+--------+---------------+----------+--------------+                     PSV cm/sStenosis       Waveform  Comments        +--------------------+--------+---------------+----------+--------------+  Inflow             44                     monophasic                +--------------------+--------+---------------+----------+--------------+  Proximal Anastomosis123                    monophasic                +--------------------+--------+---------------+----------+--------------+  Proximal Graft      85                     monophasic                +--------------------+--------+---------------+----------+--------------+  Mid Graft           76  monophasic                +--------------------+--------+---------------+----------+--------------+  Distal Graft        97                     monophasic                +--------------------+--------+---------------+----------+--------------+  Distal Anastomosis  193     50-70% stenosismonophasicturbulent flow  +--------------------+--------+---------------+----------+--------------+  Outflow            131                    monophasic                +--------------------+--------+---------------+----------+--------------+   Summary:  Left: Since prior study done 08/11/21, waveforms in the bypass graft have  changed from biphasic to monophasic and demonstrates a 50-70% stenosis at the distal anastomosis.   Right:  0.52/0.22 Great toe pressure: 27 Left:  0.65/amp great toe pressure amp  monophasic waveforms   Assessment/Plan:  77 y.o. male with hx of left CIA angioplasty and stent placement, left CFA endarterectomy with profundoplasty and bovine pericardial patch angioplasty, left BK popliteal artery TP trunk endarterectomy and left CFA to TPT bypass with ipsilateral non reversed GSV, ray amputation of left great toe and debridement of left foot infection with  dorsal foot abscess on 05/04/2021 by Dr. Carlis Abbott now with gangrenous changes to left 3rd toe  Hospital Day 2  -pt with gangrenous changes to left 3rd toe.  Bypass duplex yesterday reveals monophasic waveforms and a stenosis at the distal anastomosis.  Most likely will require angiogram of LLE with possible intervention.   -will need toe amputation with podiatry.   -Dr. Trula Slade will be by to see pt and discuss plan.   Leontine Locket, PA-C [facility] [phone] 10/24/2022 8:31 AM  I agree with the above.  I have seen and evaluated the patient.  He has gangrenous changes to a left toe.  Duplex shows monophasic waveforms and stenosis of the distal anastomosis of his tibioperoneal trunk bypass graft.  I have recommended that we proceed with angiography tomorrow via a right femoral approach and intervention if indicated.  He will be n.p.o. after midnight.  Procedure was discussed in detail.  All questions answered.  Podiatry is managing the toe.  Annamarie Major

## 2022-10-25 ENCOUNTER — Encounter (HOSPITAL_COMMUNITY)
Admission: AD | Disposition: A | Discharge status: Home / Self Care | Payer: Self-pay | Source: Home / Self Care | Attending: Internal Medicine

## 2022-10-25 DIAGNOSIS — Z9889 Other specified postprocedural states: Secondary | ICD-10-CM

## 2022-10-25 DIAGNOSIS — I70222 Atherosclerosis of native arteries of extremities with rest pain, left leg: Secondary | ICD-10-CM

## 2022-10-25 DIAGNOSIS — Z95828 Presence of other vascular implants and grafts: Secondary | ICD-10-CM

## 2022-10-25 DIAGNOSIS — M869 Osteomyelitis, unspecified: Secondary | ICD-10-CM | POA: Diagnosis not present

## 2022-10-25 DIAGNOSIS — I998 Other disorder of circulatory system: Secondary | ICD-10-CM

## 2022-10-25 HISTORY — PX: PERIPHERAL VASCULAR INTERVENTION: CATH118257

## 2022-10-25 HISTORY — PX: ABDOMINAL AORTOGRAM W/LOWER EXTREMITY: CATH118223

## 2022-10-25 LAB — CBC
HCT: 32.9 % — ABNORMAL LOW (ref 39.0–52.0)
Hemoglobin: 11.4 g/dL — ABNORMAL LOW (ref 13.0–17.0)
MCH: 30.6 pg (ref 26.0–34.0)
MCHC: 34.7 g/dL (ref 30.0–36.0)
MCV: 88.2 fL (ref 80.0–100.0)
Platelets: 348 10*3/uL (ref 150–400)
RBC: 3.73 MIL/uL — ABNORMAL LOW (ref 4.22–5.81)
RDW: 12.5 % (ref 11.5–15.5)
WBC: 7.8 10*3/uL (ref 4.0–10.5)
nRBC: 0 % (ref 0.0–0.2)

## 2022-10-25 LAB — BASIC METABOLIC PANEL
Anion gap: 11 (ref 5–15)
BUN: 24 mg/dL — ABNORMAL HIGH (ref 8–23)
CO2: 24 mmol/L (ref 22–32)
Calcium: 8.7 mg/dL — ABNORMAL LOW (ref 8.9–10.3)
Chloride: 98 mmol/L (ref 98–111)
Creatinine, Ser: 1.53 mg/dL — ABNORMAL HIGH (ref 0.61–1.24)
GFR, Estimated: 47 mL/min — ABNORMAL LOW (ref >60)
Glucose, Bld: 210 mg/dL — ABNORMAL HIGH (ref 70–99)
Potassium: 3.9 mmol/L (ref 3.5–5.1)
Sodium: 133 mmol/L — ABNORMAL LOW (ref 135–145)

## 2022-10-25 LAB — POCT ACTIVATED CLOTTING TIME: Activated Clotting Time: 250 seconds

## 2022-10-25 LAB — GLUCOSE, CAPILLARY
Glucose-Capillary: 159 mg/dL — ABNORMAL HIGH (ref 70–99)
Glucose-Capillary: 159 mg/dL — ABNORMAL HIGH (ref 70–99)
Glucose-Capillary: 117 mg/dL — ABNORMAL HIGH (ref 70–99)
Glucose-Capillary: 189 mg/dL — ABNORMAL HIGH (ref 70–99)

## 2022-10-25 LAB — VANCOMYCIN, RANDOM: Vancomycin Rm: 13 ug/mL

## 2022-10-25 SURGERY — ABDOMINAL AORTOGRAM W/LOWER EXTREMITY
Anesthesia: LOCAL

## 2022-10-25 MED ORDER — FENTANYL CITRATE (PF) 100 MCG/2ML IJ SOLN
INTRAMUSCULAR | Status: DC | PRN
  Administered 2022-10-25: 25 ug via INTRAVENOUS

## 2022-10-25 MED ORDER — SODIUM CHLORIDE 0.9% FLUSH: 3.0000 mL | INTRAVENOUS | Status: DC | PRN

## 2022-10-25 MED ORDER — ONDANSETRON HCL 4 MG/2ML IJ SOLN: 4.0000 mg | Freq: Four times a day (QID) | INTRAMUSCULAR | Status: DC | PRN

## 2022-10-25 MED ORDER — ACETAMINOPHEN 325 MG PO TABS: 650.0000 mg | ORAL_TABLET | ORAL | Status: DC | PRN

## 2022-10-25 MED ORDER — HEPARIN (PORCINE) IN NACL 1000-0.9 UT/500ML-% IV SOLN
INTRAVENOUS | Status: DC | PRN
  Administered 2022-10-25 (×2): 500 mL

## 2022-10-25 MED ORDER — MIDAZOLAM HCL 2 MG/2ML IJ SOLN
INTRAMUSCULAR | Status: AC
  Filled 2022-10-25: qty 2

## 2022-10-25 MED ORDER — LABETALOL HCL 5 MG/ML IV SOLN: 10.0000 mg | INTRAVENOUS | Status: DC | PRN

## 2022-10-25 MED ORDER — HYDRALAZINE HCL 20 MG/ML IJ SOLN: 5.0000 mg | INTRAMUSCULAR | Status: DC | PRN

## 2022-10-25 MED ORDER — SODIUM CHLORIDE 0.9 % IV SOLN: 250.0000 mL | INTRAVENOUS | Status: DC | PRN

## 2022-10-25 MED ORDER — HEPARIN (PORCINE) IN NACL 1000-0.9 UT/500ML-% IV SOLN
INTRAVENOUS | Status: AC
  Filled 2022-10-25: qty 1000

## 2022-10-25 MED ORDER — IODIXANOL 320 MG/ML IV SOLN
INTRAVENOUS | Status: DC | PRN
  Administered 2022-10-25: 70 mL

## 2022-10-25 MED ORDER — HEPARIN SODIUM (PORCINE) 1000 UNIT/ML IJ SOLN
INTRAMUSCULAR | Status: DC | PRN
  Administered 2022-10-25: 10000 [IU] via INTRAVENOUS

## 2022-10-25 MED ORDER — SODIUM CHLORIDE 0.9 % IV SOLN: INTRAVENOUS | Status: AC

## 2022-10-25 MED ORDER — LIDOCAINE HCL (PF) 1 % IJ SOLN
INTRAMUSCULAR | Status: AC
  Filled 2022-10-25: qty 30

## 2022-10-25 MED ORDER — FENTANYL CITRATE (PF) 100 MCG/2ML IJ SOLN
INTRAMUSCULAR | Status: AC
  Filled 2022-10-25: qty 2

## 2022-10-25 MED ORDER — ONDANSETRON HCL 4 MG/2ML IJ SOLN
INTRAMUSCULAR | Status: AC
  Filled 2022-10-25: qty 2

## 2022-10-25 MED ORDER — MIDAZOLAM HCL 2 MG/2ML IJ SOLN
INTRAMUSCULAR | Status: DC | PRN
  Administered 2022-10-25: 1 mg via INTRAVENOUS

## 2022-10-25 MED ORDER — ONDANSETRON HCL 4 MG/2ML IJ SOLN
INTRAMUSCULAR | Status: DC | PRN
  Administered 2022-10-25: 4 mg via INTRAVENOUS

## 2022-10-25 MED ORDER — LIDOCAINE HCL (PF) 1 % IJ SOLN
INTRAMUSCULAR | Status: DC | PRN
  Administered 2022-10-25: 15 mL

## 2022-10-25 MED ORDER — SODIUM CHLORIDE 0.9% FLUSH
3.0000 mL | Freq: Two times a day (BID) | INTRAVENOUS | Status: DC
  Administered 2022-10-26 – 2022-10-27 (×4): 3 mL via INTRAVENOUS

## 2022-10-25 MED ORDER — HEPARIN SODIUM (PORCINE) 1000 UNIT/ML IJ SOLN
INTRAMUSCULAR | Status: AC
  Filled 2022-10-25: qty 10

## 2022-10-25 SURGICAL SUPPLY — 21 items
BALLN MUSTANG 6.0X40 135 (BALLOONS) ×2
BALLOON MUSTANG 6.0X40 135 (BALLOONS) IMPLANT
CATH OMNI FLUSH 5F 65CM (CATHETERS) IMPLANT
CATH STRAIGHT 5FR 65CM (CATHETERS) IMPLANT
KIT ANGIASSIST CO2 SYSTEM (KITS) IMPLANT
KIT ENCORE 26 ADVANTAGE (KITS) IMPLANT
KIT MICROPUNCTURE NIT STIFF (SHEATH) IMPLANT
KIT PV (KITS) ×2 IMPLANT
SHEATH CATAPULT 6FR 45 (SHEATH) IMPLANT
SHEATH PINNACLE 5F 10CM (SHEATH) IMPLANT
SHEATH PINNACLE 6F 10CM (SHEATH) IMPLANT
SHEATH PROBE COVER 6X72 (BAG) IMPLANT
STENT ELUVIA 7X40X130 (Permanent Stent) IMPLANT
STOPCOCK MORSE 400PSI 3WAY (MISCELLANEOUS) IMPLANT
SYR MEDRAD MARK 7 150ML (SYRINGE) ×2 IMPLANT
TRANSDUCER W/STOPCOCK (MISCELLANEOUS) ×2 IMPLANT
TRAY PV CATH (CUSTOM PROCEDURE TRAY) ×2 IMPLANT
TUBING CIL FLEX 10 FLL-RA (TUBING) IMPLANT
WIRE MICROINTRODUCER 60CM (WIRE) IMPLANT
WIRE ROSEN-J .035X260CM (WIRE) IMPLANT
WIRE STARTER BENTSON 035X150 (WIRE) IMPLANT

## 2022-10-25 NOTE — Progress Notes (Signed)
Patient documented as a moderate falls risk.  Educated patient on importance of calling nursing staff if needing to get up.  Patient got irritable at bed alarm.  Patient is stable on feet and ambulates independently outside of the facility.  Bed alarm removed and asked patient to call when needing to use the restroom.

## 2022-10-25 NOTE — Care Management Important Message (Signed)
Important Message  Patient Details  Name: Todd Zhang MRN: [id number] Date of Birth: [birth year]   Medicare Important Message Given:  Yes  Patient has a contact precaution order in place will mail to the patient home address  Orbie Pyo 10/25/2022, 2:48 PM

## 2022-10-25 NOTE — Progress Notes (Signed)
[facility]  Subjective  - no complaints.   Objective 127/71 81 98.6 F (37 C) (Oral) 18 99%  Intake/Output Summary (Last 24 hours) at 10/25/2022 1428 Last data filed at 10/25/2022 1100 Gross per 24 hour  Intake 1408.98 ml  Output 1250 ml  Net 158.98 ml      Laboratory Lab Results: Recent Labs    10/24/22 0338 10/25/22 0344  WBC 9.6 7.8  HGB 10.7* 11.4*  HCT 31.4* 32.9*  PLT 307 348   BMET Recent Labs    10/24/22 0338 10/25/22 0344  NA 133* 133*  K 3.8 3.9  CL 97* 98  CO2 25 24  GLUCOSE 136* 210*  BUN 26* 24*  CREATININE 1.65* 1.53*  CALCIUM 8.7* 8.7*    COAG Lab Results  Component Value Date   INR 1.4 (H) 04/24/2021   No results found for: "PTT"  Assessment/Planning:  Plan aortogram with lower extremity arteriogram with a focus on the left leg with possible intervention in the setting of previous bypass.  Marty Heck 10/25/2022 2:28 PM --

## 2022-10-25 NOTE — Progress Notes (Signed)
PROGRESS NOTE Todd Zhang  [id number] DOB: 09-16-1945 DOA: 10/22/2022 PCP: Patient, [name]   Brief Narrative/Hospital Course: 77 y.o. male with medical history significant of IDDM, HTN, PVD with status post left common iliac stenting and left common femoral enterectomy/bovine patch and left femoral to TP trunk bypass in 2022, chronic left second toe ulcer, mild aortic stenosis, PAF s/p DCCV in 2016 Xarelto, CKD stage IIIb, came to [facility] ED for evaluation worsening of left second toe discoloration and rash and swelling. Patient reported that he has a chronic diabetic ulcer wound on left second toe for at least 3-4 months for which he has been following with the local orthopedic surgeon for wound care and received at least 2 rounds of p.o. antibiotics including 1 round of antibiotics about 2 weeks ago.  Despite, he noticed that last 2 weeks, the ulcer on left second toe has grown larger and increasing rash and swelling of the left foot.  Denies any fever chills and no significant discharge noticed.  This week, the color of the left second toe had turned black but no severe pain   ED Course: Patient is afebrile, blood pressure 119/60, borderline tachycardia pulse 100, PC 18, hemoglobin 12, 133, chloride 92, BUN 42, creatinine 2.4 about his baseline, glucose 300, CT showed gangrene of left second digit with soft tissue swelling and subcutaneous gas and osteomyelitis of second mid and distal phalanges.  Podiatry/vascular consulted, admitted on vancomycin cefepime and Flagyl and transferred to [facility] for  gangrenous changes to the single left toe.  2/10: S/p bypass duplex revealed monophasic waveform and a stenosis at the distal anastomosis and vascular surgery recommended angiography 2/12, podiatry following closely for possible amputation after vascular optimization.   Subjective:  Seen and examined this morning.  Resting comfortably.   Npo for procedure today  Assessment and  Plan: Principal Problem:   Osteomyelitis ([place]) Active Problems:   Type 2 diabetes mellitus with diabetic neuropathy, with long-term current use of insulin (HCC)   COPD exacerbation (HCC)   Peripheral artery disease (HCC)   CKD (chronic kidney disease) stage 3, GFR 30-59 ml/min (HCC)   Gangrene of the left  single toe Acute osteomyelitis-second toe, first and third metatarsal heads with early acute osteomyelitis PVD/vasculopath who has had extensive procedure: Vascular surgery podiatry following.MRI 2/9-showed acute osteomyelitis of second toe extending to the second MTP joint, and other early acute osteomyelitis.  Lower extremity ABI and arterial duplex shows resting right ABI w/ moderate right lower extremity arterial disease, right toe brachial index abnormal, resting left ABI w/ moderate left lower extremity arterial disease-planning for angiography today per vascular optimization prior to amputation.Cont cefepime, Flagyl and vancomycin. Cont  Plavix Lipitor.  PVD w/ history of left common iliac artery stent, left femoral endarterectomy with bovine pericardial patch angioplasty and left below-knee popliteal and tibial peroneal trunk endarterectomy and subsequently ipsilateral nonreversed saphenous vein bypass graft Also had amputation of left great toe -see above. For amgiogram as above.  Leukocytosis in the setting of above-resolved. PAF on Xarelto currently on hold in anticipation of surgery. Cont on metoprolol CKD stage IIIb: b/l creat~1.5-1.8- stable.  On gentle IV hydration overnight for angiogram Recent Labs    10/30/21 0849 02/04/22 1351 05/07/22 1142 07/23/22 1506 08/03/22 1030 08/10/22 1136 10/22/22 1333 10/23/22 0429 10/24/22 0338 10/25/22 0344  BUN 34* 25 33* 42* 37* 35* 28* 26* 26* 24*  CREATININE 1.75* 2.01* 1.83* 2.19* 1.68* 1.55* 1.88* 1.75* 1.65* 1.53*   COPD: Stable not  in exacerbation.  Hypertension: Well-controlled blood pressure, continue metoprolol holding  Lasix today   IDDM with uncontrolled hyperglycemia on Humulin R 10 units 3 times daily, SSI metformin on hold.  Blood sugar controlled.  Continue Neurontin for neuropathy Recent Labs  Lab 10/22/22 1322 10/22/22 1713 10/24/22 0838 10/24/22 1144 10/24/22 1630 10/24/22 2137 10/25/22 0801  GLUCAP  --    < > 132* 120* 161* 131* 159*  HGBA1C 8.8*  --   --   --   --   --   --    < > = values in this interval not displayed.     Normocytic anemia likely from CKD.  Monitor Mild hyponatremia-monitor  HLD continue fibrates/statin  DVT prophylaxis: heparin injection 5,000 Units Start: 10/22/22 1400 Code Status:   Code Status: Full Code Family Communication: plan of care discussed with patient/WIFE ON PHONE 2/10 Patient status is: Inpatient because of toe gangrene Level of care: Med-Surg   Dispo: The patient is from: HOME W/ WIFE            Anticipated disposition: TBD  Objective: Vitals last 24 hrs: Vitals:   10/24/22 2045 10/25/22 0610 10/25/22 0802 10/25/22 1059  BP: 127/77 110/68 109/67 138/72  Pulse: 88 81 91 88  Resp: 18 18 18   $ Temp: 98.4 F (36.9 C) (!) 97.5 F (36.4 C) 97.9 F (36.6 C)   TempSrc: Oral Oral Oral   SpO2: 97% 98% 98%   Weight:       Weight change:   Physical Examination: General exam: AAox3, weak,older appearing HEENT:Oral mucosa moist, Ear/Nose WNL grossly, dentition normal. Respiratory system: bilaterally clear BS,no use of accessory muscle Cardiovascular system: S1 & S2 +, regular rate. Gastrointestinal system: Abdomen soft,NT,ND,BS+ Nervous System:Alert, awake, moving extremities and grossly nonfocal Extremities: with gangrene toe as below Skin: No rashes,no icterus. MSK: Normal muscle bulk,tone, power    Medications reviewed:  Scheduled Meds:  acidophilus  1 capsule Oral TID   atorvastatin  40 mg Oral Daily   clopidogrel  75 mg Oral Daily   ezetimibe  10 mg Oral Daily   fenofibrate  54 mg Oral Daily   [START ON 10/26/2022] furosemide  40  mg Oral BID   gabapentin  300 mg Oral QID   heparin  5,000 Units Subcutaneous Q8H   insulin aspart  0-5 Units Subcutaneous QHS   insulin aspart  0-9 Units Subcutaneous TID WC   insulin aspart  10 Units Subcutaneous TID WC   metoprolol tartrate  25 mg Oral BID   pantoprazole  40 mg Oral Daily   senna-docusate  1 tablet Oral BID   tamsulosin  0.4 mg Oral Daily  Continuous Infusions:  ceFEPime (MAXIPIME) IV Stopped (10/25/22 0304)   metronidazole 500 mg (10/25/22 [id number])   vancomycin Stopped (10/24/22 1750)    Diet Order             Diet NPO time specified  Diet effective midnight                  Intake/Output Summary (Last 24 hours) at 10/25/2022 1316 Last data filed at 10/25/2022 1100 Gross per 24 hour  Intake 1408.98 ml  Output 1250 ml  Net 158.98 ml    Net IO Since Admission: -1,227.95 mL [10/25/22 1316]  Wt Readings from Last 3 Encounters:  10/23/22 99.5 kg  10/04/22 99.1 kg  09/24/22 96.7 kg     Unresulted Labs (From admission, onward)     Start  Ordered   10/24/22 [id number]  Basic metabolic panel  Daily,   R     Question:  Specimen collection method  Answer:  Lab=Lab collect   10/23/22 0758   10/24/22 0500  CBC  Daily,   R     Question:  Specimen collection method  Answer:  Lab=Lab collect   10/23/22 0758          Data Reviewed: I have personally reviewed following labs and imaging studies CBC: Recent Labs  Lab 10/23/22 0429 10/24/22 0338 10/25/22 0344  WBC 12.8* 9.6 7.8  HGB 10.6* 10.7* 11.4*  HCT 30.3* 31.4* 32.9*  MCV 88.1 88.2 88.2  PLT 315 307 0000000    Basic Metabolic Panel: Recent Labs  Lab 10/22/22 1333 10/23/22 0429 10/24/22 0338 10/25/22 0344  NA 135 132* 133* 133*  K 3.9 3.6 3.8 3.9  CL 94* 95* 97* 98  CO2 27 25 25 24  $ GLUCOSE 183* 156* 136* 210*  BUN 28* 26* 26* 24*  CREATININE 1.88* 1.75* 1.65* 1.53*  CALCIUM 9.2 8.5* 8.7* 8.7*    Recent Labs    10/22/22 1322  HGBA1C 8.8*   CBG: Recent Labs  Lab 10/24/22 0838  10/24/22 1144 10/24/22 1630 10/24/22 2137 10/25/22 0801  GLUCAP 132* 120* 161* 131* 159*   No results found for this or any previous visit (from the past 240 hour(s)).  Antimicrobials: Anti-infectives (From admission, onward)    Start     Dose/Rate Route Frequency Ordered Stop   10/23/22 1600  vancomycin (VANCOREADY) IVPB 1250 mg/250 mL        1,250 mg 166.7 mL/hr over 90 Minutes Intravenous Every 24 hours 10/22/22 1343     10/22/22 1600  metroNIDAZOLE (FLAGYL) IVPB 500 mg        500 mg 100 mL/hr over 60 Minutes Intravenous Every 12 hours 10/22/22 1334     10/22/22 1430  ceFEPIme (MAXIPIME) 2 g in sodium chloride 0.9 % 100 mL IVPB        2 g 200 mL/hr over 30 Minutes Intravenous Every 12 hours 10/22/22 1339     10/22/22 1430  vancomycin (VANCOREADY) IVPB 1500 mg/300 mL        1,500 mg 150 mL/hr over 120 Minutes Intravenous  Once 10/22/22 1340 10/22/22 1854      Culture/Microbiology    Component Value Date/Time   SDES BONE 05/04/2021 1803   SPECREQUEST LEFT RAY TOE 05/04/2021 1803   CULT  05/04/2021 1803    RARE VANCOMYCIN RESISTANT ENTEROCOCCUS CRITICAL RESULT CALLED TO, READ BACK BY AND VERIFIED WITH: RN T.HARRIS AT [id number] ON 05/06/2021 BY T.SAAD. NO ANAEROBES ISOLATED Performed at [facility], [place] [address], [place], [place] [address]    REPTSTATUS 05/10/2021 FINAL 05/04/2021 1803  Radiology Studies: VAS [place] LOWER EXTREMITY ARTERIAL DUPLEX  Result Date: 10/23/2022 LOWER EXTREMITY ARTERIAL DUPLEX STUDY Patient Name:  SUMPTER KICE  Date of Exam:   10/23/2022 Medical Rec #: [id number]      Accession #:    [id number] Date of Birth: 1946-08-08     Patient Gender: M Patient Age:   43 years Exam Location:  [facility] Procedure:      VAS [place] LOWER EXTREMITY ARTERIAL DUPLEX Referring Phys: Aldona Bar RHYNE --------------------------------------------------------------------------------  High Risk Factors: Hypertension, hyperlipidemia, Diabetes, past history of                     smoking.  Vascular Interventions: 05/04/2021: Left common iliac artery angioplasty with  stent placement. Left common femoral endarterectomy with                         profundaplasty and angioplasty. Left below-knee                         popliteal artery and TPT endarterectomy. Left                         common femoral to TPT bypass graft. Left great toe                         amputation. Current ABI:            R:0.52 L:0.65 Comparison Study: Prior study done 08/11/21 Performing Technologist: Sharion Dove RVS  Examination Guidelines: A complete evaluation includes B-mode imaging, spectral Doppler, color Doppler, and power Doppler as needed of all accessible portions of each vessel. Bilateral testing is considered an integral part of a complete examination. Limited examinations for reoccurring indications may be performed as noted.   Left Graft #1: Common femoral to TPT bypass graft +--------------------+--------+---------------+----------+--------------+                     PSV cm/sStenosis       Waveform  Comments       +--------------------+--------+---------------+----------+--------------+ Inflow              44                     monophasic               +--------------------+--------+---------------+----------+--------------+ Proximal Anastomosis123                    monophasic               +--------------------+--------+---------------+----------+--------------+ Proximal Graft      85                     monophasic               +--------------------+--------+---------------+----------+--------------+ Mid Graft           76                     monophasic               +--------------------+--------+---------------+----------+--------------+ Distal Graft        97                     monophasic               +--------------------+--------+---------------+----------+--------------+ Distal Anastomosis  193     50-70%  stenosismonophasicturbulent flow +--------------------+--------+---------------+----------+--------------+ Outflow             131                    monophasic               +--------------------+--------+---------------+----------+--------------+   Summary: Left: Since prior study done 08/11/21, waveforms in the bypass graft have changed from biphasic to monophasic and demonstrates a 50-70% stenosis at the distal anastomosis.  See table(s) above for measurements and observations. Electronically signed by Harold Barban MD on 10/23/2022 at 4:51:08 PM.    Final    VAS [place] ABI WITH/WO TBI  Result Date: 10/23/2022  [place] STUDY Patient Name:  CRISTIEN JELLISON  Date of Exam:   10/23/2022 Medical Rec #: [id number]      Accession #:    [id number] Date of Birth: 18-Feb-1946     Patient Gender: M Patient Age:   12 years Exam Location:  [facility] Procedure:      VAS [place] ABI WITH/WO TBI Referring Phys: Aldona Bar RHYNE --------------------------------------------------------------------------------  Indications: Ulceration, gangrene, and peripheral artery disease. High Risk         Hypertension, hyperlipidemia, Diabetes, past history of Factors:          smoking.  Vascular Interventions: 05/04/2021: Left common iliac artery angioplasty                         with stent placement. Left common femoral                         endarterectomy with profundaplasty and angioplasty. Left                         below-knee                         popliteal artery and TPT endarterectomy. Left common                         femoral to TPT bypass graft. Left great toe amputation. Limitations: Today's exam was limited due to arrythmia. Comparison Study: Prior ABI done 08/11/21 Performing Technologist: Sharion Dove RVS  Examination Guidelines: A complete evaluation includes at minimum, Doppler waveform signals and systolic blood pressure reading at the level of bilateral brachial, anterior tibial, and  posterior tibial arteries, when vessel segments are accessible. Bilateral testing is considered an integral part of a complete examination. Photoelectric Plethysmograph (PPG) waveforms and toe systolic pressure readings are included as required and additional duplex testing as needed. Limited examinations for reoccurring indications may be performed as noted.  ABI Findings: +---------+------------------+-----+----------+--------+ Right    Rt Pressure (mmHg)IndexWaveform  Comment  +---------+------------------+-----+----------+--------+ Brachial 115                    triphasic          +---------+------------------+-----+----------+--------+ PTA      46                0.38 monophasic         +---------+------------------+-----+----------+--------+ DP       64                0.52 monophasic         +---------+------------------+-----+----------+--------+ Great Toe27                0.22                    +---------+------------------+-----+----------+--------+ +--------+------------------+-----+----------+-------+ Left    Lt Pressure (mmHg)IndexWaveform  Comment +--------+------------------+-----+----------+-------+ [id number]                    triphasic         +--------+------------------+-----+----------+-------+ PTA     79                0.65 monophasic        +--------+------------------+-----+----------+-------+ DP      66  0.54 monophasic        +--------+------------------+-----+----------+-------+ +-------+-----------+-----------+------------+------------+ ABI/TBIToday's ABIToday's TBIPrevious ABIPrevious TBI +-------+-----------+-----------+------------+------------+ Right  0.52       0.22       0.54        0.52         +-------+-----------+-----------+------------+------------+ Left   0.65       amputation 0.94        amputation   +-------+-----------+-----------+------------+------------+ Right TBI appears decreased  and ABIs appear essentially unchanged compared to prior study on 08/11/21. Left ABIs appear decreased compared to prior study on 08/11/21.  Summary: Right: Resting right ankle-brachial index indicates moderate right lower extremity arterial disease. The right toe-brachial index is abnormal. Left: Resting left ankle-brachial index indicates moderate left lower extremity arterial disease. *See table(s) above for measurements and observations.  Electronically signed by Harold Barban MD on 10/23/2022 at 4:50:07 PM.    Final     LOS: 3 days  Antonieta Pert, MD [facility]  10/25/2022, 1:16 PM

## 2022-10-25 NOTE — Progress Notes (Signed)
SITE AREA: right groin/femoral  SITE PRIOR TO REMOVAL:  LEVEL 0  PRESSURE APPLIED FOR: approximately 20 minutes  MANUAL: yes  PATIENT STATUS DURING PULL: stable  POST PULL SITE:  LEVEL 0  POST PULL INSTRUCTIONS GIVEN: yes, no running, jumping etc, take it easy on RLE, no soaking in water, hot tubs, bath tubs, etc, shower only for about 1 week, keep right groin area dry  POST PULL PULSES PRESENT: bilateral pedal pulses at +2  DRESSING APPLIED: gauze with tegaderm  BEDREST BEGINS @ 1851  COMMENTS:

## 2022-10-25 NOTE — Op Note (Addendum)
Patient name: Todd Zhang MRN: [id number] DOB: 11-19-1945 Sex: male  10/25/2022 Pre-operative Diagnosis: Critical limb ischemia of the left lower extremity with history of remote left lower extremity bypass and concern for bypass stenosis Post-operative diagnosis:  Same Surgeon:  Marty Heck, MD Procedure Performed: 1.  Ultrasound-guided access right common femoral artery 2.  CO2 aortogram with catheter selection of aorta 3.  Left lower extremity arteriogram with selection of third order branches 4.  Left external iliac artery angioplasty with stent placement (7 mm x 40 mm drug-coated Eluvia postdilated with a 6 mm Mustang) 5.  61 minutes of monitored moderate conscious sedation time  Indications: Patient is a 77 year old male well-known to vascular surgery that previously underwent a left common femoral endarterectomy with retrograde left common iliac stenting and a left common femoral to TP trunk bypass with saphenous vein for tissue loss on 05/04/21.  He was seen over the weekend in consultation by Dr. Trula Slade with new wound on his foot.  Noninvasive imaging was concerning for stenosis with monophasic flow down the bypass.  He presents after risks benefits discussed.  Findings:   Aortogram was initially done with CO2.  Due to significant bowel gas we had a hard time visualizing the left iliac stent and ultimately had to do a contrasted aortogram.  This showed that the left common iliac stent is widely patent.  He had a 60% stenosis in the left external iliac artery with two calcified plaques.  This was stented with a 7 mm x 40 mm drug-coated Eluvia postdilated with a 6 mm balloon.    Left lower extremity runoff showed a widely patent common femoral profunda as well as common femoral to TP trunk saphenous vein bypass graft that was also widely patent with two-vessel runoff in the peroneal and posterior tibial artery.  The distal anastomosis where there was concern for a 50 to 70%  stenosis on duplex did not show any significant disease on angiogram.   Procedure:  The patient was identified in the holding area and taken to room 8.  The patient was then placed supine on the table and prepped and draped in the usual sterile fashion.  A time out was called.  Patient received Versed and fentanyl for conscious moderate sedation.  I was present for all of sedation.  Vital signs were monitored including heart rate, respiratory rate, oxygenation and blood pressure.  Ultrasound was used to evaluate the right common femoral artery.  It was patent .  A digital ultrasound image was acquired.  A micropuncture needle was used to access the right common femoral artery under ultrasound guidance.  An 018 wire was advanced without resistance and a micropuncture sheath was placed.  The 018 wire was removed and a benson wire was placed.  The micropuncture sheath was exchanged for a 5 french sheath.  An omniflush catheter was advanced over the wire to the level of L-1.  An abdominal angiogram was obtained.  Next, using the omniflush catheter and a benson wire, the aortic bifurcation was crossed and the catheter was placed into theleft external iliac artery and left runoff was obtained.  Ultimately after evaluating images elected to intervene on his moderate left external iliac stenosis.  This inflow disease would explain the monophasic waveforms in his bypass.  We then used a Rosen wire down the left leg bypass and exchanged for a long 6 French  sheath in the right groin over the aortic bifurcation.  Patient was given 100  units/kg IV heparin.  I then got hand-injection with contrast to identify the left iliac lesion and this was stented with a 7 mm x 40 mm drug-coated Eluvia postdilated with a 6 mm angioplasty balloon.  Excellent femoral pulse at completion.  I did measure pullback pressure between the existing left common iliac stent and the new left external iliac stent where there was some disease adjacent to  the hypo.  The difference was only 10 mmHg and I elected not to stent this segment.  Wires and catheters were removed and a put a short 6 French sheath in the right common femoral artery.  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  Plan: Patient is optimized after left leg intervention today.  Can proceed with toe amputation.  Plavix statin.   Marty Heck, MD [facility] [place] Office: [phone]

## 2022-10-26 ENCOUNTER — Encounter (HOSPITAL_COMMUNITY)
Admission: AD | Disposition: A | Discharge status: Home / Self Care | Payer: Self-pay | Source: Home / Self Care | Attending: Internal Medicine

## 2022-10-26 ENCOUNTER — Inpatient Hospital Stay (HOSPITAL_COMMUNITY): Payer: Medicare HMO | Admitting: Certified Registered"

## 2022-10-26 ENCOUNTER — Inpatient Hospital Stay (HOSPITAL_COMMUNITY): Payer: Medicare HMO

## 2022-10-26 ENCOUNTER — Encounter (HOSPITAL_COMMUNITY): Payer: Self-pay | Admitting: Vascular Surgery

## 2022-10-26 DIAGNOSIS — E1169 Type 2 diabetes mellitus with other specified complication: Secondary | ICD-10-CM | POA: Diagnosis not present

## 2022-10-26 DIAGNOSIS — E1152 Type 2 diabetes mellitus with diabetic peripheral angiopathy with gangrene: Secondary | ICD-10-CM

## 2022-10-26 DIAGNOSIS — J449 Chronic obstructive pulmonary disease, unspecified: Secondary | ICD-10-CM

## 2022-10-26 DIAGNOSIS — M86672 Other chronic osteomyelitis, left ankle and foot: Secondary | ICD-10-CM | POA: Diagnosis not present

## 2022-10-26 DIAGNOSIS — Z7984 Long term (current) use of oral hypoglycemic drugs: Secondary | ICD-10-CM

## 2022-10-26 DIAGNOSIS — M869 Osteomyelitis, unspecified: Secondary | ICD-10-CM

## 2022-10-26 DIAGNOSIS — Z87891 Personal history of nicotine dependence: Secondary | ICD-10-CM

## 2022-10-26 HISTORY — PX: AMPUTATION TOE: SHX6595

## 2022-10-26 LAB — BASIC METABOLIC PANEL
Anion gap: 6 (ref 5–15)
BUN: 22 mg/dL (ref 8–23)
CO2: 26 mmol/L (ref 22–32)
Calcium: 8.6 mg/dL — ABNORMAL LOW (ref 8.9–10.3)
Chloride: 100 mmol/L (ref 98–111)
Creatinine, Ser: 1.54 mg/dL — ABNORMAL HIGH (ref 0.61–1.24)
GFR, Estimated: 46 mL/min — ABNORMAL LOW (ref >60)
Glucose, Bld: 211 mg/dL — ABNORMAL HIGH (ref 70–99)
Potassium: 3.8 mmol/L (ref 3.5–5.1)
Sodium: 132 mmol/L — ABNORMAL LOW (ref 135–145)

## 2022-10-26 LAB — POCT ACTIVATED CLOTTING TIME
Activated Clotting Time: 217 seconds
Activated Clotting Time: 179 seconds

## 2022-10-26 LAB — CBC
HCT: 32 % — ABNORMAL LOW (ref 39.0–52.0)
Hemoglobin: 11 g/dL — ABNORMAL LOW (ref 13.0–17.0)
MCH: 30.2 pg (ref 26.0–34.0)
MCHC: 34.4 g/dL (ref 30.0–36.0)
MCV: 87.9 fL (ref 80.0–100.0)
Platelets: 344 10*3/uL (ref 150–400)
RBC: 3.64 MIL/uL — ABNORMAL LOW (ref 4.22–5.81)
RDW: 12.9 % (ref 11.5–15.5)
WBC: 7.3 10*3/uL (ref 4.0–10.5)
nRBC: 0 % (ref 0.0–0.2)

## 2022-10-26 LAB — GLUCOSE, CAPILLARY
Glucose-Capillary: 131 mg/dL — ABNORMAL HIGH (ref 70–99)
Glucose-Capillary: 82 mg/dL (ref 70–99)
Glucose-Capillary: 97 mg/dL (ref 70–99)
Glucose-Capillary: 135 mg/dL — ABNORMAL HIGH (ref 70–99)
Glucose-Capillary: 143 mg/dL — ABNORMAL HIGH (ref 70–99)

## 2022-10-26 SURGERY — AMPUTATION, TOE
Anesthesia: Monitor Anesthesia Care | Site: Toe | Laterality: Left

## 2022-10-26 MED ORDER — CHLORHEXIDINE GLUCONATE 0.12 % MT SOLN
OROMUCOSAL | Status: AC
  Administered 2022-10-26: 15 mL via OROMUCOSAL
  Filled 2022-10-26: qty 15

## 2022-10-26 MED ORDER — PROPOFOL 10 MG/ML IV BOLUS
INTRAVENOUS | Status: AC
  Filled 2022-10-26: qty 20

## 2022-10-26 MED ORDER — FENTANYL CITRATE (PF) 250 MCG/5ML IJ SOLN
INTRAMUSCULAR | Status: AC
  Filled 2022-10-26: qty 5

## 2022-10-26 MED ORDER — LIDOCAINE 2% (20 MG/ML) 5 ML SYRINGE
INTRAMUSCULAR | Status: AC
  Filled 2022-10-26: qty 5

## 2022-10-26 MED ORDER — ORAL CARE MOUTH RINSE: 15.0000 mL | Freq: Once | OROMUCOSAL | Status: AC

## 2022-10-26 MED ORDER — CHLORHEXIDINE GLUCONATE CLOTH 2 % EX PADS
6.0000 | MEDICATED_PAD | Freq: Once | CUTANEOUS | Status: DC
  Administered 2022-10-26: 6 via TOPICAL

## 2022-10-26 MED ORDER — VANCOMYCIN HCL 1500 MG/300ML IV SOLN
1500.0000 mg | Freq: Once | INTRAVENOUS | Status: AC
  Administered 2022-10-26: 1500 mg via INTRAVENOUS
  Filled 2022-10-26: qty 300

## 2022-10-26 MED ORDER — FENTANYL CITRATE (PF) 250 MCG/5ML IJ SOLN
INTRAMUSCULAR | Status: DC | PRN
  Administered 2022-10-26: 50 ug via INTRAVENOUS

## 2022-10-26 MED ORDER — BUPIVACAINE HCL (PF) 0.5 % IJ SOLN
INTRAMUSCULAR | Status: AC
  Filled 2022-10-26: qty 30

## 2022-10-26 MED ORDER — VANCOMYCIN HCL 1250 MG/250ML IV SOLN
1250.0000 mg | INTRAVENOUS | Status: DC
  Filled 2022-10-26: qty 250

## 2022-10-26 MED ORDER — LACTATED RINGERS IV SOLN: INTRAVENOUS | Status: DC

## 2022-10-26 MED ORDER — BUPIVACAINE HCL 0.5 % IJ SOLN
INTRAMUSCULAR | Status: AC
  Filled 2022-10-26: qty 1

## 2022-10-26 MED ORDER — LIDOCAINE HCL (PF) 1 % IJ SOLN
INTRAMUSCULAR | Status: DC | PRN
  Administered 2022-10-26: 10 mL

## 2022-10-26 MED ORDER — CHLORHEXIDINE GLUCONATE 0.12 % MT SOLN: 15.0000 mL | Freq: Once | OROMUCOSAL | Status: AC

## 2022-10-26 MED ORDER — MIDAZOLAM HCL 2 MG/2ML IJ SOLN
INTRAMUSCULAR | Status: DC | PRN
  Administered 2022-10-26: 2 mg via INTRAVENOUS

## 2022-10-26 MED ORDER — VANCOMYCIN HCL 1250 MG/250ML IV SOLN
1250.0000 mg | INTRAVENOUS | Status: DC
  Administered 2022-10-27 – 2022-10-28 (×2): 1250 mg via INTRAVENOUS
  Filled 2022-10-26 (×2): qty 250

## 2022-10-26 MED ORDER — BACITRACIN-NEOMYCIN-POLYMYXIN OINTMENT TUBE
TOPICAL_OINTMENT | CUTANEOUS | Status: AC
  Filled 2022-10-26: qty 14.17

## 2022-10-26 MED ORDER — MIDAZOLAM HCL 2 MG/2ML IJ SOLN
INTRAMUSCULAR | Status: AC
  Filled 2022-10-26: qty 2

## 2022-10-26 MED ORDER — PROPOFOL 500 MG/50ML IV EMUL
INTRAVENOUS | Status: DC | PRN
  Administered 2022-10-26: 50 ug/kg/min via INTRAVENOUS

## 2022-10-26 MED ORDER — ACETAMINOPHEN 500 MG PO TABS: 1000.0000 mg | ORAL_TABLET | Freq: Once | ORAL | Status: DC

## 2022-10-26 MED ORDER — LIDOCAINE HCL (PF) 1 % IJ SOLN
INTRAMUSCULAR | Status: AC
  Filled 2022-10-26: qty 30

## 2022-10-26 MED ORDER — VANCOMYCIN HCL 1000 MG IV SOLR
INTRAVENOUS | Status: AC
  Filled 2022-10-26: qty 20

## 2022-10-26 MED ORDER — 0.9 % SODIUM CHLORIDE (POUR BTL) OPTIME
TOPICAL | Status: DC | PRN
  Administered 2022-10-26: 1000 mL

## 2022-10-26 SURGICAL SUPPLY — 42 items
BAG COUNTER SPONGE SURGICOUNT (BAG) ×1 IMPLANT
BAG SPNG CNTER NS LX DISP (BAG) ×1
BLADE LONG MED 31X9 (MISCELLANEOUS) IMPLANT
BNDG CMPR 9X4 STRL LF SNTH (GAUZE/BANDAGES/DRESSINGS) ×1
BNDG CONFORM 2 STRL LF (GAUZE/BANDAGES/DRESSINGS) ×1 IMPLANT
BNDG ELASTIC 3X5.8 VLCR STR LF (GAUZE/BANDAGES/DRESSINGS) ×1 IMPLANT
BNDG ELASTIC 4X5.8 VLCR STR LF (GAUZE/BANDAGES/DRESSINGS) IMPLANT
BNDG ESMARK 4X9 LF (GAUZE/BANDAGES/DRESSINGS) ×1 IMPLANT
BNDG GAUZE DERMACEA FLUFF 4 (GAUZE/BANDAGES/DRESSINGS) ×1 IMPLANT
BNDG GZE DERMACEA 4 6PLY (GAUZE/BANDAGES/DRESSINGS) ×1
BRUSH SCRUB EZ PLAIN DRY (MISCELLANEOUS) IMPLANT
CNTNR URN SCR LID CUP LEK RST (MISCELLANEOUS) IMPLANT
CONT SPEC 4OZ STRL OR WHT (MISCELLANEOUS) ×1
COVER SURGICAL LIGHT HANDLE (MISCELLANEOUS) IMPLANT
CUFF TOURN SGL QUICK 18X4 (TOURNIQUET CUFF) IMPLANT
DRSG EMULSION OIL 3X3 NADH (GAUZE/BANDAGES/DRESSINGS) ×1 IMPLANT
DURAPREP 26ML APPLICATOR (WOUND CARE) ×1 IMPLANT
ELECT REM PT RETURN 9FT ADLT (ELECTROSURGICAL) ×1
ELECTRODE REM PT RTRN 9FT ADLT (ELECTROSURGICAL) ×1 IMPLANT
GAUZE PAD ABD 8X10 STRL (GAUZE/BANDAGES/DRESSINGS) IMPLANT
GAUZE SPONGE 4X4 12PLY STRL (GAUZE/BANDAGES/DRESSINGS) ×1 IMPLANT
GLOVE BIO SURGEON STRL SZ8 (GLOVE) ×2 IMPLANT
GOWN STRL REUS W/ TWL LRG LVL3 (GOWN DISPOSABLE) ×1 IMPLANT
GOWN STRL REUS W/ TWL XL LVL3 (GOWN DISPOSABLE) ×1 IMPLANT
GOWN STRL REUS W/TWL LRG LVL3 (GOWN DISPOSABLE) ×1
GOWN STRL REUS W/TWL XL LVL3 (GOWN DISPOSABLE) ×1
KIT BASIN OR (CUSTOM PROCEDURE TRAY) ×1 IMPLANT
NDL HYPO 25X1 1.5 SAFETY (NEEDLE) ×1 IMPLANT
NEEDLE HYPO 25X1 1.5 SAFETY (NEEDLE) ×1 IMPLANT
NS IRRIG 1000ML POUR BTL (IV SOLUTION) IMPLANT
PACK ORTHO EXTREMITY (CUSTOM PROCEDURE TRAY) ×1 IMPLANT
SUCTION FRAZIER HANDLE 10FR (MISCELLANEOUS) ×1
SUCTION TUBE FRAZIER 10FR DISP (MISCELLANEOUS) ×1 IMPLANT
SUT ETHILON 3 0 FSL (SUTURE) IMPLANT
SUT PROLENE 3 0 PS 2 (SUTURE) ×1 IMPLANT
SWAB COLLECTION DEVICE MRSA (MISCELLANEOUS) IMPLANT
SWAB CULTURE ESWAB REG 1ML (MISCELLANEOUS) IMPLANT
SYR 10ML LL (SYRINGE) IMPLANT
SYR CONTROL 10ML LL (SYRINGE) IMPLANT
TUBE CONNECTING 12X1/4 (SUCTIONS) ×1 IMPLANT
UNDERPAD 30X36 HEAVY ABSORB (UNDERPADS AND DIAPERS) ×1 IMPLANT
YANKAUER SUCT BULB TIP NO VENT (SUCTIONS) IMPLANT

## 2022-10-26 NOTE — Anesthesia Postprocedure Evaluation (Signed)
Anesthesia Post Note  Patient: Todd Zhang  Procedure(s) Performed: AMPUTATION LEFT SECOND TOE (Left: Toe)     Patient location during evaluation: PACU Anesthesia Type: MAC Level of consciousness: awake and alert Pain management: pain level controlled Vital Signs Assessment: post-procedure vital signs reviewed and stable Respiratory status: spontaneous breathing, nonlabored ventilation, respiratory function stable and patient connected to nasal cannula oxygen Cardiovascular status: stable and blood pressure returned to baseline Postop Assessment: no apparent nausea or vomiting Anesthetic complications: no  No notable events documented.  Last Vitals:  Vitals:   10/26/22 1345 10/26/22 1400  BP: 106/64 116/72  Pulse: 79 79  Resp: 19 20  Temp:    SpO2: 98% 96%    Last Pain:  Vitals:   10/26/22 1330  TempSrc:   PainSc: [name]

## 2022-10-26 NOTE — Progress Notes (Signed)
PROGRESS NOTE Todd Zhang  [id number] DOB: 12/08/1945 DOA: 10/22/2022 PCP: Patient, [name]   Brief Narrative/Hospital Course: 77 y.o. male with medical history significant of IDDM, HTN, PVD with status post left common iliac stenting and left common femoral enterectomy/bovine patch and left femoral to TP trunk bypass in 2022, chronic left second toe ulcer, mild aortic stenosis, PAF s/p DCCV in 2016 Xarelto, CKD stage IIIb, came to [facility] ED for evaluation worsening of left second toe discoloration and rash and swelling. Patient reported that he has a chronic diabetic ulcer wound on left second toe for at least 3-4 months for which he has been following with the local orthopedic surgeon for wound care and received at least 2 rounds of p.o. antibiotics including 1 round of antibiotics about 2 weeks ago.  Despite, he noticed that last 2 weeks, the ulcer on left second toe has grown larger and increasing rash and swelling of the left foot.  Denies any fever chills and no significant discharge noticed.  This week, the color of the left second toe had turned black but no severe pain   ED Course: Patient is afebrile, blood pressure 119/60, borderline tachycardia pulse 100, PC 18, hemoglobin 12, 133, chloride 92, BUN 42, creatinine 2.4 about his baseline, glucose 300, CT showed gangrene of left second digit with soft tissue swelling and subcutaneous gas and osteomyelitis of second mid and distal phalanges.  Podiatry/vascular consulted, admitted on vancomycin cefepime and Flagyl and transferred to [facility] for  gangrenous changes to the single left toe.  2/10: S/p bypass duplex revealed monophasic waveform and a stenosis at the distal anastomosis and vascular surgery recommended angiography 2/12 and had left external iliac artery angioplasty with a stent placement.   Subjective: Seen and examined this morning resting comfortably.  He has no new complaints. Overnight patient has been afebrile, BP  stable Labs showed stable creatinine 1.5 CBC stable  Assessment and Plan: Principal Problem:   Osteomyelitis ([place]) Active Problems:   Type 2 diabetes mellitus with diabetic neuropathy, with long-term current use of insulin (HCC)   COPD exacerbation (HCC)   Peripheral artery disease (HCC)   CKD (chronic kidney disease) stage 3, GFR 30-59 ml/min (HCC)   Gangrene of the left  second toe Acute osteomyelitis-second toe, first and third metatarsal heads with early acute osteomyelitis PVD/vasculopath who has had extensive procedure: Vascular surgery podiatry following.MRI 2/9-showed acute osteomyelitis of second toe extending to the second MTP joint, and other early acute osteomyelitis.  Lower extremity ABI and arterial duplex shows resting right ABI w/ moderate right lower extremity arterial disease, right toe brachial index abnormal, resting left ABI w/ moderate left lower extremity arterial disease-s/p left external iliac artery angioplasty with a stent placement, . Cont plavix, statins. Cont cefepime, Flagyl and vancomycin per podiatry.  Plan for left second toe amputation this afternoon  PVD w/ history of left common iliac artery stent, left femoral endarterectomy with bovine pericardial patch angioplasty and left below-knee popliteal and tibial peroneal trunk endarterectomy and subsequently ipsilateral nonreversed saphenous vein bypass graft Also had amputation of left great toe -see above. Cont statiin, plavix  Leukocytosis in the setting of above-resolved. PAF on Xarelto currently on hold for surgery. Cont on metoprolol CKD stage IIIb: b/l creat~1.5-1.8- stable. S/p gentle IV hydration for angiogram Recent Labs    02/04/22 1351 05/07/22 1142 07/23/22 1506 08/03/22 1030 08/10/22 1136 10/22/22 1333 10/23/22 0429 10/24/22 0338 10/25/22 0344 10/26/22 0152  BUN 25 33* 42* 37* 35* 28*  26* 26* 24* 22  CREATININE 2.01* 1.83* 2.19* 1.68* 1.55* 1.88* 1.75* 1.65* 1.53* 1.54*   COPD: Stable  not in exacerbation.  Hypertension: Fairly controlled on Metoprolol.resume Lasix   IDDM with uncontrolled hyperglycemia and neuropathy: on Humulin R 10 units 3 times daily, SSI. Metformin on hold.  Blood sugar stable.  Continue Neurontin Recent Labs  Lab 10/22/22 1322 10/22/22 1713 10/24/22 2137 10/25/22 0801 10/25/22 1328 10/25/22 1611 10/25/22 2136  GLUCAP  --    < > 131* 159* 159* 117* 189*  HGBA1C 8.8*  --   --   --   --   --   --    < > = values in this interval not displayed.    Normocytic anemia likely from CKD.  Monitor Mild hyponatremia-monitor  HLD continue fibrates/statin  DVT prophylaxis: heparin injection 5,000 Units Start: 10/22/22 1400 Code Status:   Code Status: Full Code Family Communication: plan of care discussed with patient  Patient status is: Inpatient because of toe gangrene Level of care: Progressive Cardiac  Dispo: The patient is from: HOME W/ WIFE            Anticipated disposition: TBD  Objective: Vitals last 24 hrs: Vitals:   10/25/22 1915 10/25/22 2144 10/26/22 0100 10/26/22 0308  BP: (!) 154/62 116/77  (!) 145/79  Pulse: 84 91 90 84  Resp: 20   20  Temp: 98.1 F (36.7 C)   98.1 F (36.7 C)  TempSrc: Oral   Oral  SpO2: 97%  91% 95%  Weight:       Weight change:   Physical Examination: General exam: AA, weak,older appearing HEENT:Oral mucosa moist, Ear/Nose WNL grossly, dentition normal. Respiratory system: bilaterally clear BS, no use of accessory muscle Cardiovascular system: S1 & S2 +, regular rate. Gastrointestinal system: Abdomen soft, NT,ND,BS+ Nervous System:Alert, awake, moving extremities and grossly nonfocal Extremities: LE ankle edema neg, lower extremities warm, gangrenous left 2nd toe Skin: No rashes,no icterus. MSK: Normal muscle bulk,tone, power   Medications reviewed:  Scheduled Meds:  acidophilus  1 capsule Oral TID   atorvastatin  40 mg Oral Daily   clopidogrel  75 mg Oral Daily   ezetimibe  10 mg Oral Daily    fenofibrate  54 mg Oral Daily   furosemide  40 mg Oral BID   gabapentin  300 mg Oral QID   heparin  5,000 Units Subcutaneous Q8H   insulin aspart  0-5 Units Subcutaneous QHS   insulin aspart  0-9 Units Subcutaneous TID WC   insulin aspart  10 Units Subcutaneous TID WC   metoprolol tartrate  25 mg Oral BID   pantoprazole  40 mg Oral Daily   senna-docusate  1 tablet Oral BID   sodium chloride flush  3 mL Intravenous Q12H   tamsulosin  0.4 mg Oral Daily  Continuous Infusions:  sodium chloride     ceFEPime (MAXIPIME) IV 200 mL/hr at 10/26/22 0356   metronidazole 500 mg (10/26/22 0537)   vancomycin Stopped (10/24/22 1750)    Diet Order             Diet NPO time specified  Diet effective midnight                  Intake/Output Summary (Last 24 hours) at 10/26/2022 0753 Last data filed at 10/26/2022 0300 Gross per 24 hour  Intake 423.09 ml  Output 950 ml  Net -526.91 ml   Net IO Since Admission: -1,454.86 mL [10/26/22 0753]  Wt Readings  from Last 3 Encounters:  10/23/22 99.5 kg  10/04/22 99.1 kg  09/24/22 96.7 kg     Unresulted Labs (From admission, onward)     Start     Ordered   10/24/22 [id number]  Basic metabolic panel  Daily,   R     Question:  Specimen collection method  Answer:  Lab=Lab collect   10/23/22 0758   10/24/22 0500  CBC  Daily,   R     Question:  Specimen collection method  Answer:  Lab=Lab collect   10/23/22 0758          Data Reviewed: I have personally reviewed following labs and imaging studies CBC: Recent Labs  Lab 10/23/22 0429 10/24/22 0338 10/25/22 0344 10/26/22 0152  WBC 12.8* 9.6 7.8 7.3  HGB 10.6* 10.7* 11.4* 11.0*  HCT 30.3* 31.4* 32.9* 32.0*  MCV 88.1 88.2 88.2 87.9  PLT 315 307 348 [id number]   Basic Metabolic Panel: Recent Labs  Lab 10/22/22 1333 10/23/22 0429 10/24/22 0338 10/25/22 0344 10/26/22 0152  NA 135 132* 133* 133* 132*  K 3.9 3.6 3.8 3.9 3.8  CL 94* 95* 97* 98 100  CO2 27 25 25 24 26  $ GLUCOSE 183* 156* 136*  210* 211*  BUN 28* 26* 26* 24* 22  CREATININE 1.88* 1.75* 1.65* 1.53* 1.54*  CALCIUM 9.2 8.5* 8.7* 8.7* 8.6*   No results for input(s): "HGBA1C" in the last 72 hours. CBG: Recent Labs  Lab 10/24/22 2137 10/25/22 0801 10/25/22 1328 10/25/22 1611 10/25/22 2136  GLUCAP 131* 159* 159* 117* 189*  No results found for this or any previous visit (from the past 240 hour(s)).  Antimicrobials: Anti-infectives (From admission, onward)    Start     Dose/Rate Route Frequency Ordered Stop   10/23/22 1600  vancomycin (VANCOREADY) IVPB 1250 mg/250 mL        1,250 mg 166.7 mL/hr over 90 Minutes Intravenous Every 24 hours 10/22/22 1343     10/22/22 1600  metroNIDAZOLE (FLAGYL) IVPB 500 mg        500 mg 100 mL/hr over 60 Minutes Intravenous Every 12 hours 10/22/22 1334     10/22/22 1430  ceFEPIme (MAXIPIME) 2 g in sodium chloride 0.9 % 100 mL IVPB        2 g 200 mL/hr over 30 Minutes Intravenous Every 12 hours 10/22/22 1339     10/22/22 1430  vancomycin (VANCOREADY) IVPB 1500 mg/300 mL        1,500 mg 150 mL/hr over 120 Minutes Intravenous  Once 10/22/22 1340 10/22/22 1854      Culture/Microbiology    Component Value Date/Time   SDES BONE 05/04/2021 1803   SPECREQUEST LEFT RAY TOE 05/04/2021 1803   CULT  05/04/2021 1803    RARE VANCOMYCIN RESISTANT ENTEROCOCCUS CRITICAL RESULT CALLED TO, READ BACK BY AND VERIFIED WITH: RN T.HARRIS AT [id number] ON 05/06/2021 BY T.SAAD. NO ANAEROBES ISOLATED Performed at [facility], [place] [address] [place], [place] [address]    REPTSTATUS 05/10/2021 FINAL 05/04/2021 1803  Radiology Studies: PERIPHERAL VASCULAR CATHETERIZATION  Result Date: 10/25/2022 Images from the original result were not included.   Patient name: Todd Zhang   MRN: [id number]        DOB: 08-Feb-1946        Sex: male  10/25/2022 Pre-operative Diagnosis: Critical limb ischemia of the left lower extremity with history of remote left lower extremity bypass and concern for bypass  stenosis Post-operative diagnosis:  Same Surgeon:  Gwenyth Allegra.  Carlis Abbott, MD Procedure Performed: 1.  Ultrasound-guided access right common femoral artery 2.  CO2 aortogram with catheter selection of aorta 3.  Left lower extremity arteriogram with selection of third order branches 4.  Left external iliac artery angioplasty with stent placement (7 mm x 40 mm drug-coated Eluvia postdilated with a 6 mm Mustang) 5.  61 minutes of monitored moderate conscious sedation time  Indications: Patient is a 77 year old male well-known to vascular surgery that previously underwent a left common femoral endarterectomy with retrograde left common iliac stenting and a left common femoral to TP trunk bypass with saphenous vein for tissue loss on 05/04/21.  He was seen over the weekend in consultation by Dr. Trula Slade with new wound on his foot.  Noninvasive imaging was concerning for stenosis with monophasic flow down the bypass.  He presents after risks benefits discussed.  Findings:  Aortogram was initially done with CO2.  Due to significant bowel gas we had a hard time visualizing the left iliac stent and ultimately had to do a contrasted aortogram.  This showed that the left common iliac stent is widely patent.  He had a 60% stenosis in the left external iliac artery with two calcified plaques.  This was stented with a 7 mm x 40 mm drug-coated Eluvia postdilated with a 6 mm balloon.   Left lower extremity runoff showed a widely patent common femoral profunda as well as common femoral to TP trunk saphenous vein bypass graft that was also widely patent with two-vessel runoff in the peroneal and posterior tibial artery.             Procedure:  The patient was identified in the holding area and taken to room 8.  The patient was then placed supine on the table and prepped and draped in the usual sterile fashion.  A time out was called.  Patient received Versed and fentanyl for conscious moderate sedation.  I was present for all of  sedation.  Vital signs were monitored including heart rate, respiratory rate, oxygenation and blood pressure.  Ultrasound was used to evaluate the right common femoral artery.  It was patent .  A digital ultrasound image was acquired.  A micropuncture needle was used to access the right common femoral artery under ultrasound guidance.  An 018 wire was advanced without resistance and a micropuncture sheath was placed.  The 018 wire was removed and a benson wire was placed.  The micropuncture sheath was exchanged for a 5 french sheath.  An omniflush catheter was advanced over the wire to the level of L-1.  An abdominal angiogram was obtained.  Next, using the omniflush catheter and a benson wire, the aortic bifurcation was crossed and the catheter was placed into theleft external iliac artery and left runoff was obtained.  Ultimately after evaluating images elected to intervene on his moderate left external iliac stenosis.  This inflow disease would explain the monophasic waveforms in his bypass.  We then used a Rosen wire down the left leg bypass and exchanged for a long 6 French  sheath in the right groin over the aortic bifurcation.  Patient was given 100 units/kg IV heparin.  I then got hand-injection with contrast to identify the left iliac lesion and this was stented with a 7 mm x 40 mm drug-coated Eluvia postdilated with a 6 mm angioplasty balloon.  Excellent femoral pulse at completion.  I did measure pullback pressure between the existing left common iliac stent and the new left external iliac stent  where there was some disease adjacent to the hypo.  The difference was only 10 mmHg and I elected not to stent this segment.  Wires and catheters were removed and a put a short 6 French sheath in the right common femoral artery.  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  Plan: Patient is optimized after left leg intervention  today.  Can proceed with toe amputation.  Plavix statin.   Marty Heck, MD [facility] [place] Office: [phone]   VAS [place] LOWER EXTREMITY ARTERIAL DUPLEX  Result Date: 10/23/2022 LOWER EXTREMITY ARTERIAL DUPLEX STUDY Patient Name:  Todd Zhang  Date of Exam:   10/23/2022 Medical Rec #: [id number]      Accession #:    [id number] Date of Birth: 1946-06-29     Patient Gender: M Patient Age:   48 years Exam Location:  [facility] Procedure:      VAS [place] LOWER EXTREMITY ARTERIAL DUPLEX Referring Phys: Aldona Bar RHYNE --------------------------------------------------------------------------------  High Risk Factors: Hypertension, hyperlipidemia, Diabetes, past history of                    smoking.  Vascular Interventions: 05/04/2021: Left common iliac artery angioplasty with                         stent placement. Left common femoral endarterectomy with                         profundaplasty and angioplasty. Left below-knee                         popliteal artery and TPT endarterectomy. Left                         common femoral to TPT bypass graft. Left great toe                         amputation. Current ABI:            R:0.52 L:0.65 Comparison Study: Prior study done 08/11/21 Performing Technologist: Sharion Dove RVS  Examination Guidelines: A complete evaluation includes B-mode imaging, spectral Doppler, color Doppler, and power Doppler as needed of all accessible portions of each vessel. Bilateral testing is considered an integral part of a complete examination. Limited examinations for reoccurring indications may be performed as noted.   Left Graft #1: Common femoral to TPT bypass graft +--------------------+--------+---------------+----------+--------------+                     PSV cm/sStenosis       Waveform  Comments       +--------------------+--------+---------------+----------+--------------+ Inflow              44                     monophasic                +--------------------+--------+---------------+----------+--------------+ Proximal Anastomosis123                    monophasic               +--------------------+--------+---------------+----------+--------------+ Proximal Graft      85  monophasic               +--------------------+--------+---------------+----------+--------------+ Mid Graft           76                     monophasic               +--------------------+--------+---------------+----------+--------------+ Distal Graft        97                     monophasic               +--------------------+--------+---------------+----------+--------------+ Distal Anastomosis  193     50-70% stenosismonophasicturbulent flow +--------------------+--------+---------------+----------+--------------+ Outflow             131                    monophasic               +--------------------+--------+---------------+----------+--------------+   Summary: Left: Since prior study done 08/11/21, waveforms in the bypass graft have changed from biphasic to monophasic and demonstrates a 50-70% stenosis at the distal anastomosis.  See table(s) above for measurements and observations. Electronically signed by Harold Barban MD on 10/23/2022 at 4:51:08 PM.    Final     LOS: 4 days  Antonieta Pert, MD [facility]  10/26/2022, 7:53 AM

## 2022-10-26 NOTE — H&P (View-Only) (Signed)
Subjective:  Patient ID: Todd Zhang, male    DOB: [birth year],  MRN: [id number]  Patient seen at bedside this morning. Doing well. S/p left lower extremity angiogram and angioplasty. Ready to discuss surgery for toe amputation.   Past Medical History:  Diagnosis Date   Atrial fibrillation ([place])    Cervical spinal stenosis    DDD (degenerative disc disease), cervical    DDD (degenerative disc disease), lumbar    Diabetes mellitus without complication ([place])    Dyspnea    Hyperlipidemia    Hypertension    Peripheral artery disease ([place])      Past Surgical History:  Procedure Laterality Date   ABDOMINAL AORTOGRAM W/LOWER EXTREMITY N/A 04/30/2021   Procedure: ABDOMINAL AORTOGRAM W/LOWER EXTREMITY;  Surgeon: Cherre Robins, MD;  Location: [facility];  Service: Cardiovascular;  Laterality: N/A;   ABDOMINAL AORTOGRAM W/LOWER EXTREMITY N/A 10/25/2022   Procedure: ABDOMINAL AORTOGRAM W/LOWER EXTREMITY;  Surgeon: Marty Heck, MD;  Location: [facility];  Service: Cardiovascular;  Laterality: N/A;   AMPUTATION TOE     AMPUTATION TOE Left 05/04/2021   Procedure: LEFT GREAT TOE AMPUTATION;  Surgeon: Marty Heck, MD;  Location: [place];  Service: Vascular;  Laterality: Left;   BYPASS GRAFT FEMORAL-PERONEAL Left 05/04/2021   Procedure: LEFT COMMON FEMORAL-TIBIOPERONEAL TRUNK BYPASS;  Surgeon: Marty Heck, MD;  Location: [facility];  Service: Vascular;  Laterality: Left;   ENDARTERECTOMY FEMORAL Left 05/04/2021   Procedure: LEFT COMMON FEMORAL ENDARTERECTOMY WITH BOVINE PATCH;  Surgeon: Marty Heck, MD;  Location: [place];  Service: Vascular;  Laterality: Left;   ENDARTERECTOMY TIBIOPERONEAL Left 05/04/2021   Procedure: LEFT BELOW KNEE [place] WITH  TIBIOPERONEAL ENDARTERECTOMY;  Surgeon: Marty Heck, MD;  Location: [place];  Service: Vascular;  Laterality: Left;   ENDOVEIN HARVEST OF GREATER SAPHENOUS VEIN  05/04/2021   Procedure: HARVEST OF LEFT  GREATER SAPHENOUS VEIN;  Surgeon: Marty Heck, MD;  Location: [place];  Service: Vascular;;   INSERTION OF ILIAC STENT Left 05/04/2021   Procedure: LEFT COMMON ILLIAC ATERY ANGIOPLASTY WITH STENT;  Surgeon: Marty Heck, MD;  Location: [place];  Service: Vascular;  Laterality: Left;   INTRAOPERATIVE ARTERIOGRAM Left 05/04/2021   Procedure: LEFT ARTERY ARTERIOGRAM;  Surgeon: Marty Heck, MD;  Location: [place];  Service: Vascular;  Laterality: Left;   IRRIGATION AND DEBRIDEMENT FOOT Left 05/04/2021   Procedure: IRRIGATION AND DEBRIDEMENT FOOT;  Surgeon: Marty Heck, MD;  Location: [place];  Service: Vascular;  Laterality: Left;   KNEE SURGERY Left    NOSE SURGERY     PERIPHERAL VASCULAR INTERVENTION  10/25/2022   Procedure: PERIPHERAL VASCULAR INTERVENTION;  Surgeon: Marty Heck, MD;  Location: [facility];  Service: Cardiovascular;;   TEE WITHOUT CARDIOVERSION N/A 05/01/2021   Procedure: TRANSESOPHAGEAL ECHOCARDIOGRAM (TEE);  Surgeon: Skeet Latch, MD;  Location: [place];  Service: Cardiovascular;  Laterality: N/A;       Latest Ref Rng & Units 10/26/2022    1:52 AM 10/25/2022    3:44 AM 10/24/2022    3:38 AM  CBC  WBC 4.0 - 10.5 K/uL 7.3  7.8  9.6   Hemoglobin 13.0 - 17.0 g/dL 11.0  11.4  10.7   Hematocrit 39.0 - 52.0 % 32.0  32.9  31.4   Platelets 150 - 400 K/uL 344  348  307        Latest Ref Rng & Units 10/26/2022    1:52 AM  10/25/2022    3:44 AM 10/24/2022    3:38 AM  BMP  Glucose 70 - 99 mg/dL 211  210  136   BUN 8 - 23 mg/dL 22  24  26   $ Creatinine 0.61 - 1.24 mg/dL 1.54  1.53  1.65   Sodium 135 - 145 mmol/L 132  133  133   Potassium 3.5 - 5.1 mmol/L 3.8  3.9  3.8   Chloride 98 - 111 mmol/L 100  98  97   CO2 22 - 32 mmol/L 26  24  25   $ Calcium 8.9 - 10.3 mg/dL 8.6  8.7  8.7      Objective:   Vitals:   10/26/22 0100 10/26/22 0308  BP:  (!) 145/79  Pulse: 90 84  Resp:  20  Temp:  98.1 F (36.7 C)  SpO2: 91% 95%     General:AA&O x 3. Normal mood and affect   Vascular: DP and PT pulses 2/4 bilateral. Brisk capillary refill to all digits. Pedal hair present   Neruological. Epicritic sensation grossly intact.   Derm: Left second necrotic digit.   MSK: MMT 5/5 in dorsiflexion, plantar flexion, inversion and eversion. Normal joint ROM without pain or crepitus.        Assessment & Plan:  Patient was evaluated and treated and all questions answered.  DX: Left second toe osteomyelitis and gangrene.  S/p left lower extremity angioplasty Patient cleared by vascular to proceed with toe amputation.  Discussed with patient in detail procedure and will plan for left second toe amputation this afternoon.  NPO.   Lorenda Peck, DPM  Accessible via secure chat for questions or concerns.

## 2022-10-26 NOTE — Transfer of Care (Signed)
Immediate Anesthesia Transfer of Care Note  Patient: Todd Zhang  Procedure(s) Performed: AMPUTATION LEFT SECOND TOE (Left: Toe)  Patient Location: PACU  Anesthesia Type:MAC  Level of Consciousness: drowsy and patient cooperative  Airway & Oxygen Therapy: Patient Spontanous Breathing  Post-op Assessment: Report given to RN and Post -op Vital signs reviewed and stable  Post vital signs: Reviewed and stable  Last Vitals:  Vitals Value Taken Time  BP    Temp    Pulse 81 10/26/22 1330  Resp 17 10/26/22 1330  SpO2 93 % 10/26/22 1330  Vitals shown include unvalidated device data.  Last Pain:  Vitals:   10/26/22 1256  TempSrc: Oral  PainSc: 0-No pain      Patients Stated Pain Goal: 0 ([id number])  Complications: No notable events documented.

## 2022-10-26 NOTE — Anesthesia Preprocedure Evaluation (Addendum)
Anesthesia Evaluation  Patient identified by MRN, date of birth, ID band Patient awake    Reviewed: Allergy & Precautions, NPO status , Patient's Chart, lab work & pertinent test results  Airway Mallampati: III  TM Distance: >3 FB Neck ROM: Full    Dental  (+) Dental Advisory Given   Pulmonary sleep apnea , COPD, former smoker   breath sounds clear to auscultation       Cardiovascular hypertension, Pt. on medications + Peripheral Vascular Disease  + dysrhythmias Atrial Fibrillation  Rhythm:Regular Rate:Normal     Neuro/Psych negative neurological ROS     GI/Hepatic Neg liver ROS,GERD  ,,  Endo/Other  diabetes, Type 2, Oral Hypoglycemic Agents    Renal/GU Renal InsufficiencyRenal disease     Musculoskeletal  (+) Arthritis ,    Abdominal   Peds  Hematology  (+) Blood dyscrasia, anemia   Anesthesia Other Findings   Reproductive/Obstetrics                             Anesthesia Physical Anesthesia Plan  ASA: 3  Anesthesia Plan: MAC   Post-op Pain Management: Tylenol PO (pre-op)*   Induction:   PONV Risk Score and Plan: 1 and Ondansetron, Propofol infusion and Treatment may vary due to age or medical condition  Airway Management Planned: Natural Airway and Simple Face Mask  Additional Equipment:   Intra-op Plan:   Post-operative Plan:   Informed Consent: I have reviewed the patients History and Physical, chart, labs and discussed the procedure including the risks, benefits and alternatives for the proposed anesthesia with the patient or authorized representative who has indicated his/her understanding and acceptance.       Plan Discussed with: CRNA  Anesthesia Plan Comments:        Anesthesia Quick Evaluation

## 2022-10-26 NOTE — OR Nursing (Signed)
1240: Pt educated on the risks of keeping metal bracelet on his wrist during surgery. Pt stated that he did not want to take the bracelet off for surgery even though there is a risk of burns due to bovie use. Surgeon notified.

## 2022-10-26 NOTE — Op Note (Signed)
   OPERATIVE REPORT Patient name: Todd Zhang MRN: [id number] DOB: 30-Jun-1946  DOS:  10/26/22  Preop Dx: Left second digit gangrene and osteomyelitis.  Postop Dx: same  Procedure:  1. Left second digit amputation  Surgeon: Lorenda Peck, DPM  Anesthesia: 50-50 mixture of 2% lidocaine plain with 0.5% Marcaine plain totaling 10cc infiltrated in the patient's left lower extremity  Hemostasis: No TQ necessary   EBL: 15 mL Materials: N/a Injectables: 10 cc lidocaine plain 1% Pathology: Left second toe for pathology and culture   Condition: The patient tolerated the procedure and anesthesia well. No complications noted or reported   Justification for procedure: The patient is a 77 y.o. male for treatment of left second digit osteomyelitis. All conservative modalities of been unsuccessful in providing any sort of satisfactory alleviation of symptoms with the patient. The patient was told benefits as well as possible side effects of the surgery. The patient consented for surgical correction. The patient consent form was reviewed. All patient questions were answered. No guarantees were expressed or implied. The patient and the surgeon both signed the patient consent form with the witness present and placed in the patient's chart.   Procedure in Detail: The patient was brought to the operating room, placed in the operating table in the supine position at which time an aseptic scrub and drape were performed about the patient's respective lower extremity after anesthesia was induced as described above. Attention was then directed to the surgical area where procedure number one commenced.  Procedure #1:   Attention was directed to the left second digit were an incision was preformed encircling the base of the toe. Incision was made down to bone and digit was amputated at the level of the metatarsophalangeal joint. After the toe was disarticulated it was passed off to the back table to be sent  to pathology for further evaluation. The extensor and flexor tendons were identified and resected as far proximally as possible. Any necrotic tissue was removed to healthy bleeding tissue. The metatarsal head was identified and noted to be hard. The area was irrigated copiously sterile saline and residual bone was sent to microbiology. Any bleeders noted were cauterized as necessary. The skin was re-approximated utilizing 3-0 nylon suture.    Dry sterile compressive dressings were then applied to all previously mentioned incision sites about the patient's lower extremity. The patient was then transferred from the operating room to the recovery room having tolerated the procedure and anesthesia well. All vital signs are stable. After a brief stay in the recovery room the patient was readmitted to the floor.    Disposition:  Patient is ok for discharge from podiatry standpoint. Will keep dressing clean dry and intact until follow-up in podiatry clinic in about one week. Heel weightbearing in surgical shoe.  Will need two weeks of oral antibiotics upon discharge.    Lorenda Peck, DPM [facility]  Dr. Lorenda Peck, DPM    [address] [place], [place] [address]                Office [phone]  Fax [phone]

## 2022-10-26 NOTE — Progress Notes (Signed)
Pharmacy Antibiotic Note  Todd Zhang is a 77 y.o. male admitted on 10/22/2022 with  wound infection .  Pharmacy has been consulted for vanco and cefepime dosing.  Currently on day#6 of abx - WBC WNL, Scr stable at 1.54 (CrCl 49 mL/min). Vancomycin random yesterday came back at 13 - missed dose on 2/12 so will give slightly higher dose today then continue previous regimen (missed dose 2/12). Underwent L external iliac artery angioplasty and stent placement on 2/12 - plan for toe amputation today.   Plan: Vancomycin 1500 mg IV x1 today then continue 1250 mg IV every 24 hours.  eAUC 496 mcg*hr/mL (Scr utilized 1.55). Cefepime 2 grams iv q12h Monitor renal function and adjust doses as able. F/up on cultures and opportunities to de-escalate. F/up on LOT  Weight: 99.5 kg (219 lb 5.7 oz)  Temp (24hrs), Avg:98.3 F (36.8 C), Min:98.1 F (36.7 C), Max:98.6 F (37 C)  Recent Labs  Lab 10/22/22 1333 10/23/22 0429 10/24/22 0338 10/25/22 0344 10/25/22 1111 10/26/22 0152  WBC  --  12.8* 9.6 7.8  --  7.3  CREATININE 1.88* 1.75* 1.65* 1.53*  --  1.54*  VANCORANDOM  --   --   --   --  13  --     Estimated Creatinine Clearance: 49.1 mL/min (A) (by C-G formula based on SCr of 1.54 mg/dL (H)).    Allergies  Allergen Reactions   Asa [Aspirin] Hives   Penicillins Hives    Hives + vomiting as an adult  - tolerated Ceftriaxone 04/2021   Tetanus Toxoid     unknown    Antimicrobials this admission: Cefepime 2/9 >>   Vanco 2/9 >>   Flagyl 2/9 >>  Thank you for allowing pharmacy to be a part of this patient's care.  Antonietta Jewel, PharmD, [place] Clinical Pharmacist  Phone: [phone] 10/26/2022 11:49 AM  Please check AMION for all [place] phone numbers After 10:00 PM, call [place] [phone]

## 2022-10-26 NOTE — Interval H&P Note (Signed)
History and Physical Interval Note:  10/26/2022 11:58 AM  Todd Zhang  has presented today for surgery, with the diagnosis of Left second toe osteomyelitis and gangrene.  The various methods of treatment have been discussed with the patient and family. After consideration of risks, benefits and other options for treatment, the patient has consented to  Procedure(s): AMPUTATION TOE (Left) as a surgical intervention.  The patient's history has been reviewed, patient examined, no change in status, stable for surgery.  I have reviewed the patient's chart and labs.  Questions were answered to the patient's satisfaction.     Lorenda Peck

## 2022-10-26 NOTE — Progress Notes (Addendum)
  Progress Note    10/26/2022 7:58 AM 1 Day Post-Op  Subjective:  feeling good this morning. Denies pain    Vitals:   10/26/22 0100 10/26/22 0308  BP:  (!) 145/79  Pulse: 90 84  Resp:  20  Temp:  98.1 F (36.7 C)  SpO2: 91% 95%    Physical Exam: Cardiac:  regular rate and rhythm Lungs:  nonlabored Incisions:  R groin cath site soft without hematoma Extremities:  palpable L PT, L 2nd toe gangrene   CBC    Component Value Date/Time   WBC 7.3 10/26/2022 0152   RBC 3.64 (L) 10/26/2022 0152   HGB 11.0 (L) 10/26/2022 0152   HGB 13.6 08/03/2022 1030   HCT 32.0 (L) 10/26/2022 0152   HCT 40.1 08/03/2022 1030   PLT 344 10/26/2022 0152   PLT 317 08/03/2022 1030   MCV 87.9 10/26/2022 0152   MCV 90 08/03/2022 1030   MCH 30.2 10/26/2022 0152   MCHC 34.4 10/26/2022 0152   RDW 12.9 10/26/2022 0152   RDW 12.5 08/03/2022 1030   LYMPHSABS 1.8 08/03/2022 1030   MONOABS 0.5 05/05/2021 1231   EOSABS 0.1 08/03/2022 1030   BASOSABS 0.1 08/03/2022 1030    BMET    Component Value Date/Time   NA 132 (L) 10/26/2022 0152   NA 136 08/03/2022 1030   K 3.8 10/26/2022 0152   CL 100 10/26/2022 0152   CO2 26 10/26/2022 0152   GLUCOSE 211 (H) 10/26/2022 0152   BUN 22 10/26/2022 0152   BUN 37 (H) 08/03/2022 1030   CREATININE 1.54 (H) 10/26/2022 0152   CALCIUM 8.6 (L) 10/26/2022 0152   GFRNONAA 46 (L) 10/26/2022 0152   GFRAA 44 (L) 10/17/2020 1434    INR    Component Value Date/Time   INR 1.4 (H) 04/24/2021 1041     Intake/Output Summary (Last 24 hours) at 10/26/2022 0758 Last data filed at 10/26/2022 0300 Gross per 24 hour  Intake 423.09 ml  Output 950 ml  Net -526.91 ml      Assessment/Plan:  77 y.o. male is 1 day post op,s/p: L external iliac artery stenting   -Doing well this AM s/p L EIA stenting. R groin access site is soft without hematoma -He has an easily palpable L PT  -Gangrenous L 2nd toe. Patient is cleared from vascular standpoint for L 2nd toe  amputation today -Continue Plavix, Statin   Vicente Serene, PA-C [facility] [phone] 10/26/2022 7:58 AM   I have seen and evaluated the patient. I agree with the PA note as documented above.  Postop day 1 status post left external iliac stenting.  His previous left common iliac stent as well as his left femoral endarterectomy and left femoral to TP trunk bypass are all widely patent.  He has a palpable PT pulse at the ankle.  Optimized for toe amputation today with podiatry.  Right groin access site looks fine.  Plavix statin for risk reduction.  He has aspirin allergy.  Will arrange follow-up in 1 month with noninvasive imaging.  Marty Heck, MD [facility] [place] Office: [phone]

## 2022-10-26 NOTE — Brief Op Note (Signed)
10/22/2022 - 10/26/2022  11:59 AM  PATIENT:  Todd Zhang  77 y.o. male  PRE-OPERATIVE DIAGNOSIS:  Left second toe osteomyelitis and gangrene  POST-OPERATIVE DIAGNOSIS:  * No post-op diagnosis entered *  PROCEDURE:  Procedure(s): AMPUTATION TOE (Left)  SURGEON:  Surgeon(s) and Role:    * Lorenda Peck, DPM - Primary  PHYSICIAN ASSISTANT:   ASSISTANTS: none   ANESTHESIA:   local and MAC  EBL: 15 cc   BLOOD ADMINISTERED:none  DRAINS: none   LOCAL MEDICATIONS USED:  LIDOCAINE  and Amount: 10 ml  SPECIMEN:  Source of Specimen:  Left second toe  DISPOSITION OF SPECIMEN:   pathology and culture of residual wound.   COUNTS:  YES  TOURNIQUET:  * Missing tourniquet times found for documented tourniquets in log: [id number] *  DICTATION: .Note written in [place]: Admit to inpatient   PATIENT DISPOSITION:  PACU - hemodynamically stable.   Delay start of Pharmacological VTE agent (>24hrs) due to surgical blood loss or risk of bleeding: yes

## 2022-10-26 NOTE — Progress Notes (Signed)
Subjective:  Patient ID: Todd Zhang, male    DOB: 10-15-1945,  MRN: [id number]  Patient seen at bedside this morning. Doing well. S/p left lower extremity angiogram and angioplasty. Ready to discuss surgery for toe amputation.   Past Medical History:  Diagnosis Date   Atrial fibrillation ([place])    Cervical spinal stenosis    DDD (degenerative disc disease), cervical    DDD (degenerative disc disease), lumbar    Diabetes mellitus without complication ([place])    Dyspnea    Hyperlipidemia    Hypertension    Peripheral artery disease ([place])      Past Surgical History:  Procedure Laterality Date   ABDOMINAL AORTOGRAM W/LOWER EXTREMITY N/A 04/30/2021   Procedure: ABDOMINAL AORTOGRAM W/LOWER EXTREMITY;  Surgeon: Cherre Robins, MD;  Location: [place] [facility];  Service: Cardiovascular;  Laterality: N/A;   ABDOMINAL AORTOGRAM W/LOWER EXTREMITY N/A 10/25/2022   Procedure: ABDOMINAL AORTOGRAM W/LOWER EXTREMITY;  Surgeon: Marty Heck, MD;  Location: [place] [facility];  Service: Cardiovascular;  Laterality: N/A;   AMPUTATION TOE     AMPUTATION TOE Left 05/04/2021   Procedure: LEFT GREAT TOE AMPUTATION;  Surgeon: Marty Heck, MD;  Location: [place];  Service: Vascular;  Laterality: Left;   BYPASS GRAFT FEMORAL-PERONEAL Left 05/04/2021   Procedure: LEFT COMMON FEMORAL-TIBIOPERONEAL TRUNK BYPASS;  Surgeon: Marty Heck, MD;  Location: [place];  Service: Vascular;  Laterality: Left;   ENDARTERECTOMY FEMORAL Left 05/04/2021   Procedure: LEFT COMMON FEMORAL ENDARTERECTOMY WITH BOVINE PATCH;  Surgeon: Marty Heck, MD;  Location: [place];  Service: Vascular;  Laterality: Left;   ENDARTERECTOMY TIBIOPERONEAL Left 05/04/2021   Procedure: LEFT BELOW KNEE [place] WITH  TIBIOPERONEAL ENDARTERECTOMY;  Surgeon: Marty Heck, MD;  Location: [place];  Service: Vascular;  Laterality: Left;   ENDOVEIN HARVEST OF GREATER SAPHENOUS VEIN  05/04/2021   Procedure: HARVEST OF LEFT  GREATER SAPHENOUS VEIN;  Surgeon: Marty Heck, MD;  Location: [place];  Service: Vascular;;   INSERTION OF ILIAC STENT Left 05/04/2021   Procedure: LEFT COMMON ILLIAC ATERY ANGIOPLASTY WITH STENT;  Surgeon: Marty Heck, MD;  Location: [place];  Service: Vascular;  Laterality: Left;   INTRAOPERATIVE ARTERIOGRAM Left 05/04/2021   Procedure: LEFT ARTERY ARTERIOGRAM;  Surgeon: Marty Heck, MD;  Location: [place];  Service: Vascular;  Laterality: Left;   IRRIGATION AND DEBRIDEMENT FOOT Left 05/04/2021   Procedure: IRRIGATION AND DEBRIDEMENT FOOT;  Surgeon: Marty Heck, MD;  Location: [place];  Service: Vascular;  Laterality: Left;   KNEE SURGERY Left    NOSE SURGERY     PERIPHERAL VASCULAR INTERVENTION  10/25/2022   Procedure: PERIPHERAL VASCULAR INTERVENTION;  Surgeon: Marty Heck, MD;  Location: [place] [facility];  Service: Cardiovascular;;   TEE WITHOUT CARDIOVERSION N/A 05/01/2021   Procedure: TRANSESOPHAGEAL ECHOCARDIOGRAM (TEE);  Surgeon: Skeet Latch, MD;  Location: [place];  Service: Cardiovascular;  Laterality: N/A;       Latest Ref Rng & Units 10/26/2022    1:52 AM 10/25/2022    3:44 AM 10/24/2022    3:38 AM  CBC  WBC 4.0 - 10.5 K/uL 7.3  7.8  9.6   Hemoglobin 13.0 - 17.0 g/dL 11.0  11.4  10.7   Hematocrit 39.0 - 52.0 % 32.0  32.9  31.4   Platelets 150 - 400 K/uL 344  348  307        Latest Ref Rng & Units 10/26/2022    1:52 AM  10/25/2022    3:44 AM 10/24/2022    3:38 AM  BMP  Glucose 70 - 99 mg/dL 211  210  136   BUN 8 - 23 mg/dL 22  24  26   $ Creatinine 0.61 - 1.24 mg/dL 1.54  1.53  1.65   Sodium 135 - 145 mmol/L 132  133  133   Potassium 3.5 - 5.1 mmol/L 3.8  3.9  3.8   Chloride 98 - 111 mmol/L 100  98  97   CO2 22 - 32 mmol/L 26  24  25   $ Calcium 8.9 - 10.3 mg/dL 8.6  8.7  8.7      Objective:   Vitals:   10/26/22 0100 10/26/22 0308  BP:  (!) 145/79  Pulse: 90 84  Resp:  20  Temp:  98.1 F (36.7 C)  SpO2: 91% 95%     General:AA&O x 3. Normal mood and affect   Vascular: DP and PT pulses 2/4 bilateral. Brisk capillary refill to all digits. Pedal hair present   Neruological. Epicritic sensation grossly intact.   Derm: Left second necrotic digit.   MSK: MMT 5/5 in dorsiflexion, plantar flexion, inversion and eversion. Normal joint ROM without pain or crepitus.        Assessment & Plan:  Patient was evaluated and treated and all questions answered.  DX: Left second toe osteomyelitis and gangrene.  S/p left lower extremity angioplasty Patient cleared by vascular to proceed with toe amputation.  Discussed with patient in detail procedure and will plan for left second toe amputation this afternoon.  NPO.   Lorenda Peck, DPM  Accessible via secure chat for questions or concerns.

## 2022-10-26 NOTE — TOC Initial Note (Signed)
Transition of Care [facility]) - Initial/Assessment Note    Patient Details  Name: Todd Zhang MRN: [id number] Date of Birth: 12/23/1945  Transition of Care [facility]) CM/SW Contact:    Bethena Roys, RN Phone Number: 10/26/2022, 12:07 PM  Clinical Narrative: Patient presented as a transfer from [place]. Patient presented with left second toe osteomyelitis and gangrene- plan for amputation of left second toe today. PTA patient was from home alone. Patient states he has support of his sister and she drives him to appointments. Case Manager will continue to follow for transition of care needs as the patient progresses post surgery.                  Expected Discharge Plan: [place] Barriers to Discharge: Continued Medical Work up   Patient Goals and CMS Choice Patient states their goals for this hospitalization and ongoing recovery are:: patient wants to heal and go home.   Expected Discharge Plan and Services In-house Referral: NA Discharge Planning Services: CM Consult Post Acute Care Choice: Home Health Living arrangements for the past 2 months: Apartment                   DME Agency: NA   Prior Living Arrangements/Services Living arrangements for the past 2 months: Apartment Lives with:: Self Patient language and need for interpreter reviewed:: Yes        Need for Family Participation in Patient Care: Yes (Comment) Care giver support system in place?: Yes (comment)   Criminal Activity/Legal Involvement Pertinent to Current Situation/Hospitalization: No - Comment as needed  Activities of Daily Living Home Assistive Devices/Equipment: Cane (specify quad or straight), Eyeglasses ADL Screening (condition at time of admission) Patient's cognitive ability adequate to safely complete daily activities?: Yes Is the patient deaf or have difficulty hearing?: No Does the patient have difficulty seeing, even when wearing glasses/contacts?: No Does the patient  have difficulty concentrating, remembering, or making decisions?: No Patient able to express need for assistance with ADLs?: Yes Does the patient have difficulty dressing or bathing?: No Independently performs ADLs?: Yes (appropriate for developmental age) Does the patient have difficulty walking or climbing stairs?: No Weakness of Legs: None Weakness of Arms/Hands: None  Permission Sought/Granted Permission sought to share information with : Case Manager, Family Supports    Emotional Assessment Appearance:: Appears stated age Attitude/Demeanor/Rapport: Engaged Affect (typically observed): Appropriate Orientation: : Oriented to Situation, Oriented to  Time, Oriented to Place, Oriented to Self Alcohol / Substance Use: Not Applicable Psych Involvement: No (comment)  Admission diagnosis:  Osteomyelitis ([place]) [M86.9] Patient Active Problem List   Diagnosis Date Noted   Infection of toe 10/04/2022   Aortic valve stenosis 10/04/2022   BMI 29.0-29.9,adult 08/03/2022   HLD (hyperlipidemia) 07/28/2022   Osteomyelitis ([place]) 06/19/2021   History of sepsis 06/02/2021   Acquired absence of other left toe(s) ([place]) 05/08/2021   Afib ([place]) 04/24/2021   CKD (chronic kidney disease) stage 3, GFR 30-59 ml/min ([place]) 04/24/2021   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 09/13/2020   Peripheral artery disease ([place])    COPD exacerbation ([place]) 05/14/2019   DDD (degenerative disc disease), cervical 11/06/2018   Generalized anxiety disorder 11/29/2017   Benign prostatic hyperplasia with incomplete bladder emptying 11/28/2017   Gastroesophageal reflux disease without esophagitis 09/07/2017   Essential hypertension 09/01/2017   Onychomycosis 09/01/2017   History of cardioversion 08/28/2015   Type 2 diabetes mellitus with diabetic neuropathy, with long-term current use of insulin (  [place]) 06/19/2014   PCP:  [name], FNP Pharmacy:   [place], [place] [id number] [place] [id number] Phone: [phone] Fax: [phone]  Social Determinants of Health (SDOH) Social History: SDOH Screenings   Food Insecurity: No Food Insecurity (10/23/2022)  Housing: Low Risk  (10/23/2022)  Recent Concern: Housing - Medium Risk (09/21/2022)  Transportation Needs: No Transportation Needs (10/23/2022)  Utilities: Not At Risk (10/23/2022)  Alcohol Screen: Low Risk  (09/21/2022)  Depression (PHQ2-9): Low Risk  (09/21/2022)  Financial Resource Strain: Low Risk  (09/21/2022)  Physical Activity: Inactive (09/21/2022)  Social Connections: Moderately Integrated (09/21/2022)  Stress: Stress Concern Present (09/21/2022)  Tobacco Use: Medium Risk (10/26/2022)   Readmission Risk Interventions     No data to display

## 2022-10-27 ENCOUNTER — Encounter (HOSPITAL_COMMUNITY): Payer: Self-pay | Admitting: Podiatry

## 2022-10-27 ENCOUNTER — Ambulatory Visit: Payer: Medicare HMO | Admitting: Family Medicine

## 2022-10-27 DIAGNOSIS — M869 Osteomyelitis, unspecified: Secondary | ICD-10-CM | POA: Diagnosis not present

## 2022-10-27 LAB — SURGICAL PATHOLOGY

## 2022-10-27 LAB — BASIC METABOLIC PANEL
Anion gap: 10 (ref 5–15)
BUN: 19 mg/dL (ref 8–23)
CO2: 24 mmol/L (ref 22–32)
Calcium: 8.5 mg/dL — ABNORMAL LOW (ref 8.9–10.3)
Chloride: 101 mmol/L (ref 98–111)
Creatinine, Ser: 1.4 mg/dL — ABNORMAL HIGH (ref 0.61–1.24)
GFR, Estimated: 52 mL/min — ABNORMAL LOW (ref >60)
Glucose, Bld: 125 mg/dL — ABNORMAL HIGH (ref 70–99)
Potassium: 3.8 mmol/L (ref 3.5–5.1)
Sodium: 135 mmol/L (ref 135–145)

## 2022-10-27 LAB — CBC
HCT: 30.6 % — ABNORMAL LOW (ref 39.0–52.0)
Hemoglobin: 10.8 g/dL — ABNORMAL LOW (ref 13.0–17.0)
MCH: 31.2 pg (ref 26.0–34.0)
MCHC: 35.3 g/dL (ref 30.0–36.0)
MCV: 88.4 fL (ref 80.0–100.0)
Platelets: 339 10*3/uL (ref 150–400)
RBC: 3.46 MIL/uL — ABNORMAL LOW (ref 4.22–5.81)
RDW: 13.1 % (ref 11.5–15.5)
WBC: 8.4 10*3/uL (ref 4.0–10.5)
nRBC: 0 % (ref 0.0–0.2)

## 2022-10-27 LAB — GLUCOSE, CAPILLARY
Glucose-Capillary: 132 mg/dL — ABNORMAL HIGH (ref 70–99)
Glucose-Capillary: 83 mg/dL (ref 70–99)
Glucose-Capillary: 147 mg/dL — ABNORMAL HIGH (ref 70–99)
Glucose-Capillary: 152 mg/dL — ABNORMAL HIGH (ref 70–99)

## 2022-10-27 MED ORDER — INSULIN ASPART 100 UNIT/ML IJ SOLN
4.0000 [IU] | Freq: Three times a day (TID) | INTRAMUSCULAR | Status: DC
  Administered 2022-10-27 – 2022-10-28 (×2): 4 [IU] via SUBCUTANEOUS

## 2022-10-27 NOTE — Progress Notes (Signed)
PROGRESS NOTE RANALD Zhang  [id number] DOB: 11-19-1945 DOA: 10/22/2022 PCP: [name], FNP   Brief Narrative/Hospital Course: 77 y.o. male with medical history significant of IDDM, HTN, PVD with status post left common iliac stenting and left common femoral enterectomy/bovine patch and left femoral to TP trunk bypass in 2022, chronic left second toe ulcer, mild aortic stenosis, PAF s/p DCCV in 2016 Xarelto, CKD stage IIIb, came to [facility] ED for evaluation worsening of left second toe discoloration and rash and swelling. Patient reported that he has a chronic diabetic ulcer wound on left second toe for at least 3-4 months for which he has been following with the local orthopedic surgeon for wound care and received at least 2 rounds of p.o. antibiotics including 1 round of antibiotics about 2 weeks ago.  Despite, he noticed that last 2 weeks, the ulcer on left second toe has grown larger and increasing rash and swelling of the left foot.  Denies any fever chills and no significant discharge noticed.  This week, the color of the left second toe had turned black but no severe pain   ED Course: Patient is afebrile, blood pressure 119/60, borderline tachycardia pulse 100, PC 18, hemoglobin 12, 133, chloride 92, BUN 42, creatinine 2.4 about his baseline, glucose 300, CT showed gangrene of left second digit with soft tissue swelling and subcutaneous gas and osteomyelitis of second mid and distal phalanges.  Podiatry/vascular consulted, admitted on vancomycin cefepime and Flagyl and transferred to [facility] for  gangrenous changes to the single left toe.  2/10: S/p bypass duplex revealed monophasic waveform and a stenosis at the distal anastomosis and vascular surgery recommended angiography 2/12 and had left external iliac artery angioplasty with a stent placement. Subsequently underwent left second toe amputation 2/13, pain is well-controlled foot is wrapped.  Vascular arrange for outpatient  follow-up to continue Plavix and statin.  Podiatry is okay to discharge -advised to keep dressing clean dry and intact until follow-up in podiatry office- advised 2 wk of oral antibiotics.  Subjective: Seen and examined.  Resting comfortably. He states he wants to go to rehab Waiting for PT OT evaluation.  Assessment and Plan: Principal Problem:   Osteomyelitis ([place]) Active Problems:   Type 2 diabetes mellitus with diabetic neuropathy, with long-term current use of insulin (HCC)   COPD exacerbation (HCC)   Peripheral artery disease (HCC)   CKD (chronic kidney disease) stage 3, GFR 30-59 ml/min (HCC)   Gangrene of the left  second toe Acute osteomyelitis-second toe, first and third metatarsal heads with early acute osteomyelitis PVD/vasculopath who has had extensive procedure: Vascular surgery podiatry following.MRI 2/9-showed acute osteomyelitis of second toe extending to the second MTP joint, and other early acute osteomyelitis.  Lower extremity ABI and arterial duplex shows resting right ABI w/ moderate right lower extremity arterial disease, right toe brachial index abnormal, resting left ABI w/ moderate left lower extremity arterial disease-s/p left external iliac artery angioplasty with a stent placement 2/12 and had left external iliac artery angioplasty with a stent placement. Subsequently underwent left second toe amputation 2/13, pain is well-controlled foot is wrapped.  Vascular arrange for outpatient follow-up to continue Plavix and statin.  Podiatry advised to keep dressing clean dry and intact until follow-up in podiatry office- advised 14 days oral antibiotics.Cont plavix, statins.   PVD w/ history of left common iliac artery stent, left femoral endarterectomy with bovine pericardial patch angioplasty and left below-knee popliteal and tibial peroneal trunk endarterectomy and subsequently ipsilateral nonreversed saphenous  vein bypass graft Also had amputation of left great toe  -see above. Cont statiin, plavix   Leukocytosis in the setting of above-resolved. [id number] on metoprolol CKD stage IIIb: b/l creat~1.5-1.8- stable Recent Labs    05/07/22 1142 07/23/22 1506 08/03/22 1030 08/10/22 1136 10/22/22 1333 10/23/22 0429 10/24/22 0338 10/25/22 0344 10/26/22 0152 10/27/22 0233  BUN 33* 42* 37* 35* 28* 26* 26* 24* 22 19  CREATININE 1.83* 2.19* 1.68* 1.55* 1.88* 1.75* 1.65* 1.53* 1.54* 1.40*    COPD: Stable not in exacerbation. Hypertension: Fairly controlled on Metoprolol.resume Lasix  IDDM with uncontrolled hyperglycemia and neuropathy: cont home regimen follow-up with endocrinology Recent Labs  Lab 10/22/22 1322 10/22/22 1713 10/26/22 1331 10/26/22 1606 10/26/22 2211 10/27/22 0756 10/27/22 1220  GLUCAP  --    < > 97 135* 143* 132* 83  HGBA1C 8.8*  --   --   --   --   --   --    < > = values in this interval not displayed.    Normocytic anemia likely from CKD.  Monitor Mild hyponatremia-monitor  HLD continue fibrates/statin  DVT prophylaxis: heparin injection 5,000 Units Start: 10/22/22 1400 Code Status:   Code Status: Full Code Family Communication: plan of care discussed with patient  Patient status is: Inpatient because of toe gangrene Level of care: Progressive Cardiac  Dispo: The patient is from: HOME W/ WIFE            Anticipated disposition: ptot pending, he is interested to go to SNF   Objective: Vitals last 24 hrs: Vitals:   10/27/22 0000 10/27/22 0335 10/27/22 0400 10/27/22 0431  BP:  130/62  130/62  Pulse: (!) 56 (!) 34 89 89  Resp: 16   16  Temp: 98.2 F (36.8 C) 97.9 F (36.6 C)  97.9 F (36.6 C)  TempSrc: Oral Oral    SpO2: 95% 93% 96% 98%  Weight:       Weight change:   Physical Examination: General exam: AAox3, weak,older appearing HEENT:Oral mucosa moist, Ear/Nose WNL grossly, dentition normal. Respiratory system: bilaterally clear BS, no use of accessory muscle Cardiovascular system: S1 & S2 +, regular  rate. Gastrointestinal system: Abdomen soft, NT,ND,BS+ Nervous System:Alert, awake, moving extremities and grossly nonfocal Extremities: LE ankle edema neg, left foot with dressing in place Skin:No rashes,no icterus. [id number] muscle bulk,tone, power   Medications reviewed:  Scheduled Meds:  acidophilus  1 capsule Oral TID   atorvastatin  40 mg Oral Daily   clopidogrel  75 mg Oral Daily   ezetimibe  10 mg Oral Daily   fenofibrate  54 mg Oral Daily   furosemide  40 mg Oral BID   gabapentin  300 mg Oral QID   heparin  5,000 Units Subcutaneous Q8H   insulin aspart  0-5 Units Subcutaneous QHS   insulin aspart  0-9 Units Subcutaneous TID WC   insulin aspart  4 Units Subcutaneous TID WC   metoprolol tartrate  25 mg Oral BID   pantoprazole  40 mg Oral Daily   senna-docusate  1 tablet Oral BID   sodium chloride flush  3 mL Intravenous Q12H   tamsulosin  0.4 mg Oral Daily  Continuous Infusions:  sodium chloride     ceFEPime (MAXIPIME) IV Stopped (10/27/22 0251)   metronidazole 100 mL/hr at 10/27/22 0700   vancomycin 1,250 mg (10/27/22 0920)    Diet Order             Diet Carb Modified Fluid consistency: Thin; Room service  appropriate? Yes  Diet effective now                  Intake/Output Summary (Last 24 hours) at 10/27/2022 1414 Last data filed at 10/27/2022 0700 Gross per 24 hour  Intake 1265.98 ml  Output 500 ml  Net 765.98 ml    Net IO Since Admission: -443.88 mL [10/27/22 1414]  Wt Readings from Last 3 Encounters:  10/23/22 99.5 kg  10/04/22 99.1 kg  09/24/22 96.7 kg     Unresulted Labs (From admission, onward)     Start     Ordered   10/24/22 [id number]  Basic metabolic panel  Daily,   R     Question:  Specimen collection method  Answer:  Lab=Lab collect   10/23/22 0758   10/24/22 0500  CBC  Daily,   R     Question:  Specimen collection method  Answer:  Lab=Lab collect   10/23/22 0758          Data Reviewed: I have personally reviewed following labs and  imaging studies CBC: Recent Labs  Lab 10/23/22 0429 10/24/22 0338 10/25/22 0344 10/26/22 0152 10/27/22 0233  WBC 12.8* 9.6 7.8 7.3 8.4  HGB 10.6* 10.7* 11.4* 11.0* 10.8*  HCT 30.3* 31.4* 32.9* 32.0* 30.6*  MCV 88.1 88.2 88.2 87.9 88.4  PLT 315 307 348 344 [id number]    Basic Metabolic Panel: Recent Labs  Lab 10/23/22 0429 10/24/22 0338 10/25/22 0344 10/26/22 0152 10/27/22 0233  NA 132* 133* 133* 132* 135  K 3.6 3.8 3.9 3.8 3.8  CL 95* 97* 98 100 101  CO2 25 25 24 26 24  $ GLUCOSE 156* 136* 210* 211* 125*  BUN 26* 26* 24* 22 19  CREATININE 1.75* 1.65* 1.53* 1.54* 1.40*  CALCIUM 8.5* 8.7* 8.7* 8.6* 8.5*    No results for input(s): "HGBA1C" in the last 72 hours. CBG: Recent Labs  Lab 10/26/22 1331 10/26/22 1606 10/26/22 2211 10/27/22 0756 10/27/22 1220  GLUCAP 97 135* 143* 132* 83   Recent Results (from the past 240 hour(s))  Aerobic/Anaerobic Culture w Gram Stain (surgical/deep wound)     Status: None (Preliminary result)   Collection Time: 10/26/22  1:13 PM   Specimen: Wound  Result Value Ref Range Status   Specimen Description WOUND  Final   Special Requests NONE  Final   Gram Stain NO WBC SEEN NO ORGANISMS SEEN   Final   Culture   Final    CULTURE REINCUBATED FOR BETTER GROWTH Performed at [facility], [address] [place], [place] [address]    Report Status PENDING  Incomplete    Antimicrobials: Anti-infectives (From admission, onward)    Start     Dose/Rate Route Frequency Ordered Stop   10/27/22 1000  vancomycin (VANCOREADY) IVPB 1250 mg/250 mL        1,250 mg 166.7 mL/hr over 90 Minutes Intravenous Every 24 hours 10/26/22 1150     10/26/22 1300  vancomycin (VANCOREADY) IVPB 1250 mg/250 mL  Status:  Discontinued        1,250 mg 166.7 mL/hr over 90 Minutes Intravenous Every 24 hours 10/26/22 1146 10/26/22 1150   10/26/22 1245  vancomycin (VANCOREADY) IVPB 1500 mg/300 mL        1,500 mg 150 mL/hr over 120 Minutes Intravenous  Once 10/26/22  1150 10/26/22 1900   10/23/22 1600  vancomycin (VANCOREADY) IVPB 1250 mg/250 mL  Status:  Discontinued        1,250 mg 166.7 mL/hr over  90 Minutes Intravenous Every 24 hours 10/22/22 1343 10/26/22 1146   10/22/22 1600  metroNIDAZOLE (FLAGYL) IVPB 500 mg        500 mg 100 mL/hr over 60 Minutes Intravenous Every 12 hours 10/22/22 1334     10/22/22 1430  ceFEPIme (MAXIPIME) 2 g in sodium chloride 0.9 % 100 mL IVPB        2 g 200 mL/hr over 30 Minutes Intravenous Every 12 hours 10/22/22 1339     10/22/22 1430  vancomycin (VANCOREADY) IVPB 1500 mg/300 mL        1,500 mg 150 mL/hr over 120 Minutes Intravenous  Once 10/22/22 1340 10/22/22 1854      Culture/Microbiology    Component Value Date/Time   SDES WOUND 10/26/2022 1313   SPECREQUEST NONE 10/26/2022 1313   CULT  10/26/2022 1313    CULTURE REINCUBATED FOR BETTER GROWTH Performed at [facility] [address] [place], [place] [address]    REPTSTATUS PENDING 10/26/2022 1313  Radiology Studies: DG Foot Complete Left  Result Date: 10/26/2022 CLINICAL DATA:  Osteomyelitis of left foot EXAM: LEFT FOOT - COMPLETE 3 VIEW COMPARISON:  Radiograph of the left foot dated October 21, 2022 FINDINGS: Postsurgical changes of interval second toe resection. Prior resection of the great toe and third toe no osseous destruction. Mild degenerative changes midfoot. Soft tissue edema of the forefoot. IMPRESSION: 1. Postsurgical changes of interval second toe resection. 2. No osseous destruction to suggest osteomyelitis. Electronically Signed   By: Yetta Glassman M.D.   On: 10/26/2022 14:02   PERIPHERAL VASCULAR CATHETERIZATION  Result Date: 10/25/2022 Images from the original result were not included.   Patient name: Todd Zhang   MRN: [id number]        DOB: Jan 21, 1946        Sex: male  10/25/2022 Pre-operative Diagnosis: Critical limb ischemia of the left lower extremity with history of remote left lower extremity bypass and concern for bypass  stenosis Post-operative diagnosis:  Same Surgeon:  Marty Heck, MD Procedure Performed: 1.  Ultrasound-guided access right common femoral artery 2.  CO2 aortogram with catheter selection of aorta 3.  Left lower extremity arteriogram with selection of third order branches 4.  Left external iliac artery angioplasty with stent placement (7 mm x 40 mm drug-coated Eluvia postdilated with a 6 mm Mustang) 5.  61 minutes of monitored moderate conscious sedation time  Indications: Patient is a 77 year old male well-known to vascular surgery that previously underwent a left common femoral endarterectomy with retrograde left common iliac stenting and a left common femoral to TP trunk bypass with saphenous vein for tissue loss on 05/04/21.  He was seen over the weekend in consultation by Dr. Trula Slade with new wound on his foot.  Noninvasive imaging was concerning for stenosis with monophasic flow down the bypass.  He presents after risks benefits discussed.  Findings:  Aortogram was initially done with CO2.  Due to significant bowel gas we had a hard time visualizing the left iliac stent and ultimately had to do a contrasted aortogram.  This showed that the left common iliac stent is widely patent.  He had a 60% stenosis in the left external iliac artery with two calcified plaques.  This was stented with a 7 mm x 40 mm drug-coated Eluvia postdilated with a 6 mm balloon.   Left lower extremity runoff showed a widely patent common femoral profunda as well as common femoral to TP trunk saphenous vein bypass graft that was  also widely patent with two-vessel runoff in the peroneal and posterior tibial artery.             Procedure:  The patient was identified in the holding area and taken to room 8.  The patient was then placed supine on the table and prepped and draped in the usual sterile fashion.  A time out was called.  Patient received Versed and fentanyl for conscious moderate sedation.  I was present for all of  sedation.  Vital signs were monitored including heart rate, respiratory rate, oxygenation and blood pressure.  Ultrasound was used to evaluate the right common femoral artery.  It was patent .  A digital ultrasound image was acquired.  A micropuncture needle was used to access the right common femoral artery under ultrasound guidance.  An 018 wire was advanced without resistance and a micropuncture sheath was placed.  The 018 wire was removed and a benson wire was placed.  The micropuncture sheath was exchanged for a 5 french sheath.  An omniflush catheter was advanced over the wire to the level of L-1.  An abdominal angiogram was obtained.  Next, using the omniflush catheter and a benson wire, the aortic bifurcation was crossed and the catheter was placed into theleft external iliac artery and left runoff was obtained.  Ultimately after evaluating images elected to intervene on his moderate left external iliac stenosis.  This inflow disease would explain the monophasic waveforms in his bypass.  We then used a Rosen wire down the left leg bypass and exchanged for a long 6 French  sheath in the right groin over the aortic bifurcation.  Patient was given 100 units/kg IV heparin.  I then got hand-injection with contrast to identify the left iliac lesion and this was stented with a 7 mm x 40 mm drug-coated Eluvia postdilated with a 6 mm angioplasty balloon.  Excellent femoral pulse at completion.  I did measure pullback pressure between the existing left common iliac stent and the new left external iliac stent where there was some disease adjacent to the hypo.  The difference was only 10 mmHg and I elected not to stent this segment.  Wires and catheters were removed and a put a short 6 French sheath in the right common femoral artery.  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  Plan: Patient is optimized after left leg intervention  today.  Can proceed with toe amputation.  Plavix statin.   Marty Heck, MD [facility] Office: [phone]   VAS [place] LOWER EXTREMITY ARTERIAL DUPLEX  Result Date: 10/23/2022 LOWER EXTREMITY ARTERIAL DUPLEX STUDY Patient Name:  MICKENZIE SCHNEIDER  Date of Exam:   10/23/2022 Medical Rec #: [id number]      Accession #:    [id number] Date of Birth: 04-28-1946     Patient Gender: M Patient Age:   47 years Exam Location:  [facility] Procedure:      VAS [place] LOWER EXTREMITY ARTERIAL DUPLEX Referring Phys: Aldona Bar RHYNE --------------------------------------------------------------------------------  High Risk Factors: Hypertension, hyperlipidemia, Diabetes, past history of                    smoking.  Vascular Interventions: 05/04/2021: Left common iliac artery angioplasty with                         stent  placement. Left common femoral endarterectomy with                         profundaplasty and angioplasty. Left below-knee                         popliteal artery and TPT endarterectomy. Left                         common femoral to TPT bypass graft. Left great toe                         amputation. Current ABI:            R:0.52 L:0.65 Comparison Study: Prior study done 08/11/21 Performing Technologist: Sharion Dove RVS  Examination Guidelines: A complete evaluation includes B-mode imaging, spectral Doppler, color Doppler, and power Doppler as needed of all accessible portions of each vessel. Bilateral testing is considered an integral part of a complete examination. Limited examinations for reoccurring indications may be performed as noted.   Left Graft #1: Common femoral to TPT bypass graft +--------------------+--------+---------------+----------+--------------+                     PSV cm/sStenosis       Waveform  Comments       +--------------------+--------+---------------+----------+--------------+ Inflow              44                     monophasic                +--------------------+--------+---------------+----------+--------------+ Proximal Anastomosis123                    monophasic               +--------------------+--------+---------------+----------+--------------+ Proximal Graft      85                     monophasic               +--------------------+--------+---------------+----------+--------------+ Mid Graft           76                     monophasic               +--------------------+--------+---------------+----------+--------------+ Distal Graft        97                     monophasic               +--------------------+--------+---------------+----------+--------------+ Distal Anastomosis  193     50-70% stenosismonophasicturbulent flow +--------------------+--------+---------------+----------+--------------+ Outflow             131                    monophasic               +--------------------+--------+---------------+----------+--------------+   Summary: Left: Since prior study done 08/11/21, waveforms in the bypass graft have changed from biphasic to monophasic and demonstrates a 50-70% stenosis at the distal anastomosis.  See table(s) above for measurements and observations. Electronically signed by Harold Barban MD on 10/23/2022 at 4:51:08 PM.    Final     LOS: 5 days  Antonieta Pert, MD [facility]  10/27/2022, 2:14 PM

## 2022-10-27 NOTE — Evaluation (Signed)
Occupational Therapy Evaluation Patient Details Name: Todd Zhang MRN: [id number] DOB: 1946/03/01 Today's Date: 10/27/2022   History of Present Illness 77 y.o. 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 Past medical history of atrial fibrillation, hypertension, dyslipidemia, diabetes mellitus, s/p left CFA endarterectomy, L below-knee popliteal with TP endarterectomy, left CFA-TP trunk bypass, and left great toe amputation in 2022.   Clinical Impression   PTA pt lives alone independently. States he drives however his sister assists with taking him to doctor's appointments. Pt able to donn his [place] shoe, complete LB ADL and mobilize with minguard assist and ambulate in his room with minguard assist @ RW level. At the end of the session when having to place the chair alarm pad, pt became very agitated with this therapist and declined to answer further questions. Unsure of baseline cognition. Feel pt will most likely progress to DC home with [place] and a [facility] nurse for wound care, however need to confirm that sister can provide consistent intermittent support and assist with IADL tasks throughout the day to safely DC home. Discussed with PT. Recommend pt ambulate with staff and mobility specialist. Will follow up tomorrow to further assess.        Recommendations for follow up therapy are one component of a multi-disciplinary discharge planning process, led by the attending physician.  Recommendations may be updated based on patient status, additional functional criteria and insurance authorization.   Follow Up Recommendations  Home health OT pending level of support and ability to navigate stairs    Assistance Recommended at Discharge Intermittent Supervision/Assistance  Patient can return home with the following Assist for transportation;Assistance with cooking/housework;Two people to help with bathing/dressing/bathroom     Functional Status Assessment  Patient has had a recent decline in their functional status and demonstrates the ability to make significant improvements in function in a reasonable and predictable amount of time.  Equipment Recommendations  Tub/shower seat    Recommendations for Other Services       Precautions / Restrictions Precautions Precautions: Fall      Mobility Bed Mobility Overal bed mobility: Modified Independent                  Transfers Overall transfer level: Needs assistance Equipment used: Rolling walker (2 wheels) Transfers: Sit to/from Stand Sit to Stand: Min guard                  Balance Overall balance assessment: Needs assistance, History of Falls (last fall 1 year ago - tripped over a throw rug)   Sitting balance-Leahy Scale: Good       Standing balance-Leahy Scale: Fair Standing balance comment: feels "off balance" due to difference in shoe height                           ADL either performed or assessed with clinical judgement   ADL Overall ADL's : Needs assistance/impaired     Grooming: Set up   Upper Body Bathing: Set up   Lower Body Bathing: Min guard   Upper Body Dressing : Set up   Lower Body Dressing: Min guard   Toilet Transfer: Min guard;Ambulation;Rolling walker (2 wheels)   Toileting- Clothing Manipulation and Hygiene: Supervision/safety       Functional mobility during ADLs: Min guard;Cueing for safety;Rolling walker (2 wheels)       Vision Baseline Vision/History: 1 Wears  glasses       Perception     Praxis      Pertinent Vitals/Pain Pain Assessment Pain Assessment: Faces Faces Pain Scale: Hurts a little bit Pain Location: R LE Pain Descriptors / Indicators: Discomfort Pain Intervention(s): Limited activity within patient's tolerance, Premedicated before session     Hand Dominance     Extremity/Trunk Assessment Upper Extremity Assessment Upper Extremity Assessment: Overall  WFL for tasks assessed   Lower Extremity Assessment Lower Extremity Assessment: Defer to PT evaluation (previous toe amputations on L foot; new 2nd toe and previous great toe amputation L foot)   Cervical / Trunk Assessment Cervical / Trunk Assessment: Normal   Communication     Cognition Arousal/Alertness: Awake/alert Behavior During Therapy: Agitated, WFL for tasks assessed/performed Overall Cognitive Status: No family/caregiver present to determine baseline cognitive functioning                                 General Comments: cognition most likely at baseline; appropriate during session, however at the end of session, became agitated, cussing at this therapist and stating he should have just let his leg "rot off"; will further assess     General Comments       Exercises     Shoulder Instructions      Home Living Family/patient expects to be discharged to:: Private residence Living Arrangements: Alone Available Help at Discharge: Family;Available PRN/intermittently Type of Home: Apartment Home Access: Stairs to enter [employer] of Steps: pt declined to answer Entrance Stairs-Rails: Right Home Layout: One level     Bathroom Shower/Tub: Teacher, early years/pre: Standard Bathroom Accessibility: Yes How Accessible: Accessible via walker Home Equipment: Rolling Walker (2 wheels);Cane - single point;Grab bars - toilet;Grab bars - tub/shower          Prior Functioning/Environment Prior Level of Function : Independent/Modified Independent;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)             Mobility Comments: uses a cane as needed "one I found" ADLs Comments: does his own shopping; says sister takes thim and reminds him of appointments        OT Problem List: Impaired balance (sitting and/or standing);Decreased safety awareness;Impaired sensation;Pain      OT Treatment/Interventions: Self-care/ADL  training;DME and/or AE instruction;Therapeutic activities;Patient/family education;Balance training    OT Goals(Current goals can be found in the care plan section) Acute Rehab OT Goals Patient Stated Goal: none stated OT Goal Formulation:  (pt not agreeable at the moment ot goal setting) Time For Goal Achievement: 11/10/22 Potential to Achieve Goals: Good  OT Frequency: Min 2X/week    Co-evaluation              AM-PAC OT "6 Clicks" Daily Activity     Outcome Measure Help from another person eating meals?: None Help from another person taking care of personal grooming?: None Help from another person toileting, which includes using toliet, bedpan, or urinal?: A Little Help from another person bathing (including washing, rinsing, drying)?: A Little Help from another person to put on and taking off regular upper body clothing?: A Little Help from another person to put on and taking off regular lower body clothing?: A Little 6 Click Score: 20   End of Session Equipment Utilized During Treatment: Rolling walker (2 wheels);Gait belt Nurse Communication: Mobility status  Activity Tolerance: Patient tolerated treatment well;Other (comment) (became agitated at end of  session) Patient left: in chair;with call bell/phone within reach;with chair alarm set  OT Visit Diagnosis: Unsteadiness on feet (R26.81);Other abnormalities of gait and mobility (R26.89);Muscle weakness (generalized) (M62.81);Pain;History of falling (Z91.81) Pain - Right/Left: Right Pain - part of body: Ankle and joints of foot                Time: [id number] OT Time Calculation (min): 33 min Charges:  OT General Charges $OT Visit: 1 Visit OT Evaluation $OT Eval Moderate Complexity: 1 Mod OT Treatments $Self Care/Home Management : 8-22 mins  Maurie Boettcher, OT/L   Acute OT Clinical Specialist Acute Rehabilitation Services Pager [phone] Office [phone]   [facility] 10/27/2022, 2:28 PM

## 2022-10-27 NOTE — Discharge Summary (Signed)
Physician Discharge Summary  [name] [id number] DOB: 04-02-1946 DOA: 10/22/2022  PCP: [name], FNP  Admit date: 10/22/2022 Discharge date: 10/28/2022 Recommendations for Outpatient Follow-up:  Follow up with PCP in 1 weeks-call for appointment Follow-up with podiatry Dr. Blenda Mounts office in 1 week Please obtain BMP/CBC in one week  Discharge Dispo: Home.  He refused home health therapy despite explanation Discharge Condition: Stable Code Status:   Code Status: Full Code Diet recommendation:  Diet Order             Diet Carb Modified Fluid consistency: Thin; Room service appropriate? Yes  Diet effective now                    Brief/Interim Summary: 77 y.o. male with medical history significant of IDDM, HTN, PVD with status post left common iliac stenting and left common femoral enterectomy/bovine patch and left femoral to TP trunk bypass in 2022, chronic left second toe ulcer, mild aortic stenosis, PAF s/p DCCV in 2016 Xarelto, CKD stage IIIb, came to [facility] ED for evaluation worsening of left second toe discoloration and rash and swelling. Patient reported that he has a chronic diabetic ulcer wound on left second toe for at least 3-4 months for which he has been following with the local orthopedic surgeon for wound care and received at least 2 rounds of p.o. antibiotics including 1 round of antibiotics about 2 weeks ago.  Despite, he noticed that last 2 weeks, the ulcer on left second toe has grown larger and increasing rash and swelling of the left foot.  Denies any fever chills and no significant discharge noticed.  This week, the color of the left second toe had turned black but no severe pain   ED Course: Patient is afebrile, blood pressure 119/60, borderline tachycardia pulse 100, PC 18, hemoglobin 12, 133, chloride 92, BUN 42, creatinine 2.4 about his baseline, glucose 300, CT showed gangrene of left second digit with soft tissue swelling and subcutaneous  gas and osteomyelitis of second mid and distal phalanges.  Podiatry/vascular consulted, admitted on vancomycin cefepime and Flagyl and transferred to [facility] for  gangrenous changes to the single left toe.  2/10: S/p bypass duplex revealed monophasic waveform and a stenosis at the distal anastomosis and vascular surgery recommended angiography 2/12 and had left external iliac artery angioplasty with a stent placement. Subsequently underwent left second toe amputation 2/13, pain is well-controlled foot is wrapped.  Vascular arrange for outpatient follow-up to continue Plavix and statin.  Podiatry is okay to discharge -advised to keep dressing clean dry and intact until follow-up in podiatry office- advised 2 wk of oral antibiotics. Seen by PT OT-suggesting home health PT OT upon discharge.  Discharge Diagnoses:  Principal Problem:   Osteomyelitis ([place]) Active Problems:   Type 2 diabetes mellitus with diabetic neuropathy, with long-term current use of insulin (HCC)   COPD exacerbation (HCC)   Peripheral artery disease (HCC)   CKD (chronic kidney disease) stage 3, GFR 30-59 ml/min (HCC)  Gangrene of the left  second toe Acute osteomyelitis-second toe, first and third metatarsal heads with early acute osteomyelitis PVD/vasculopath who has had extensive procedure: Vascular surgery podiatry following.MRI 2/9-showed acute osteomyelitis of second toe extending to the second MTP joint, and other early acute osteomyelitis.  Lower extremity ABI and arterial duplex shows resting right ABI w/ moderate right lower extremity arterial disease, right toe brachial index abnormal, resting left ABI w/ moderate left lower extremity arterial disease-s/p left external iliac artery  angioplasty with a stent placement 2/12 and had left external iliac artery angioplasty with a stent placement. Subsequently underwent left second toe amputation 2/13, pain is well-controlled foot is wrapped.  Vascular arrange for outpatient  follow-up to continue Plavix and statin.  Podiatry is okay to discharge -advised to keep dressing clean dry and intact until follow-up in podiatry office- advised 2 wk of oral antibiotics.Cont plavix, statins.  Home health advised but he refused   PVD w/ history of left common iliac artery stent, left femoral endarterectomy with bovine pericardial patch angioplasty and left below-knee popliteal and tibial peroneal trunk endarterectomy and subsequently ipsilateral nonreversed saphenous vein bypass graft Also had amputation of left great toe -see above. Cont statiin, plavix   Leukocytosis in the setting of above-resolved. PAF on Xarelto currently on hold for surgery. Cont on metoprolol CKD stage IIIb: b/l creat~1.5-1.8- stable COPD: Stable not in exacerbation. Hypertension: Fairly controlled on Metoprolol.resume Lasix  IDDM with uncontrolled hyperglycemia and neuropathy: cont home regimen follow-up with endocrinology Normocytic anemia likely from CKD.  Monitor Mild hyponatremia-monitor  HLD continue fibrates/statin  Consults: Vvs, podiatry Subjective: Alert and oriented resting comfortably," I am ready to go home" "I need a ride", taxi has been arranged  Discharge Exam: Vitals:   10/28/22 0404 10/28/22 0826  BP: 124/67 (!) 145/85  Pulse: 83 80  Resp: 19 18  Temp: 98.1 F (36.7 C) 97.8 F (36.6 C)  SpO2: 96% 98%   General: Pt is alert, awake, not in acute distress Cardiovascular: RRR, S1/S2 +, no rubs, no gallops Respiratory: CTA bilaterally, no wheezing, no rhonchi Abdominal: Soft, NT, ND, bowel sounds + Extremities: no edema, no cyanosis, left foot with dressing in place  Discharge Instructions  Discharge Instructions     Discharge instructions   Complete by: As directed    Please call call MD or return to ER for similar or worsening recurring problem that brought you to hospital or if any fever,nausea/vomiting,abdominal pain, uncontrolled pain, chest pain,  shortness of  breath or any other alarming symptoms.  Please follow-up your doctor as instructed in a week time and call the office for appointment.  Please avoid alcohol, smoking, or any other illicit substance and maintain healthy habits including taking your regular medications as prescribed.  You were cared for by a hospitalist during your hospital stay. If you have any questions about your discharge medications or the care you received while you were in the hospital after you are discharged, you can call the unit and ask to speak with the hospitalist on call if the hospitalist that took care of you is not available.  Once you are discharged, your primary care physician will handle any further medical issues. Please note that NO REFILLS for any discharge medications will be authorized once you are discharged, as it is imperative that you return to your primary care physician (or establish a relationship with a primary care physician if you do not have one) for your aftercare needs so that they can reassess your need for medications and monitor your lab values   Increase activity slowly   Complete by: As directed       Allergies as of 10/28/2022       Reactions   Asa [aspirin] Hives   Penicillins Hives   Hives + vomiting as an adult  - tolerated Ceftriaxone 04/2021   Tetanus Toxoid    unknown        Medication List     TAKE these medications  Accu-Chek Aviva Plus test strip Generic drug: glucose blood CHECK BLOOD SUGAR FOUR TIMES DAILY Dx E11.40   alendronate 70 MG tablet Commonly known as: FOSAMAX TAKE 1 TABLET BY MOUTH ONCE WEEKLY ON SUNDAY ON ON AN EMPTY STOMACH with full glass of water What changed:  how much to take how to take this when to take this additional instructions   atorvastatin 40 MG tablet Commonly known as: Lipitor Take 1 tablet (40 mg total) by mouth daily.   cefadroxil 500 MG capsule Commonly known as: DURICEF Take 1 capsule (500 mg total) by mouth 2 (two)  times daily for 13 days.   clopidogrel 75 MG tablet Commonly known as: PLAVIX Take 1 tablet (75 mg total) by mouth daily.   doxycycline 100 MG tablet Commonly known as: ADOXA Take 1 tablet (100 mg total) by mouth 2 (two) times daily for 13 days.   ezetimibe 10 MG tablet Commonly known as: ZETIA Take 1 tablet (10 mg total) by mouth daily.   fenofibrate 145 MG tablet Commonly known as: TRICOR Take 1 tablet (145 mg total) by mouth daily.   furosemide 40 MG tablet Commonly known as: LASIX Take 1 tablet (40 mg total) by mouth 2 (two) times daily.   gabapentin 300 MG capsule Commonly known as: NEURONTIN Take 1 capsule (300 mg total) by mouth 4 (four) times daily. TAKE 1 CAPSULE BY MOUTH THREE TIMES DAILY FOR PAIN   insulin regular 100 units/mL injection Commonly known as: HumuLIN R INJECT 10 UNITS INTO THE SKIN THREE TIMES DAILY BEFORE MEALS What changed:  how much to take how to take this when to take this additional instructions   Insulin Syringe-Needle U-100 31G X 5/16" 0.3 ML Misc Commonly known as: Global Inject Ease Insulin Syr USE WITH INSULIN EVERY DAY Dx E11.40   metFORMIN 500 MG tablet Commonly known as: GLUCOPHAGE Take 1 tablet (500 mg total) by mouth 2 (two) times daily.   metoprolol tartrate 25 MG tablet Commonly known as: LOPRESSOR Take 1 tablet (25 mg total) by mouth 2 (two) times daily.   nitroGLYCERIN 0.4 MG SL tablet Commonly known as: NITROSTAT DISSOLVE 1 TABLET UNDER THE TONGUE EVERY 5 MINUTES AS NEEDED FOR CHEST PAIN. DO NOT EXCEED A TOTAL OF 3 DOSES IN 15 MINUTES. What changed: See the new instructions.   pantoprazole 40 MG tablet Commonly known as: PROTONIX Take 1 tablet (40 mg total) by mouth daily.   tamsulosin 0.4 MG Caps capsule Commonly known as: FLOMAX Take 1 capsule (0.4 mg total) by mouth daily.        Follow-up Information      [facility] Follow up in 1 month(s).   Specialty: Vascular  Surgery Why: Office will call to arrange your appt(s) (sent) Contact information: [address] [place], DPM Follow up in 1 week(s).   Specialty: Podiatry Contact information: [address] [place] [address] [phone]                Allergies  Allergen Reactions   Diona Fanti [Aspirin] Hives   Penicillins Hives    Hives + vomiting as an adult  - tolerated Ceftriaxone 04/2021   Tetanus Toxoid     unknown    The results of significant diagnostics from this hospitalization (including imaging, microbiology, ancillary and laboratory) are listed below for reference.    Microbiology: Recent Results (from the past  240 hour(s))  Aerobic/Anaerobic Culture w Gram Stain (surgical/deep wound)     Status: None (Preliminary result)   Collection Time: 10/26/22  1:13 PM   Specimen: Wound  Result Value Ref Range Status   Specimen Description WOUND  Final   Special Requests NONE  Final   Gram Stain NO WBC SEEN NO ORGANISMS SEEN   Final   Culture   Final    CULTURE REINCUBATED FOR BETTER GROWTH Performed at [facility], [address], [place], [place] [address]    Report Status PENDING  Incomplete    Procedures/Studies: DG Foot Complete Left  Result Date: 10/26/2022 CLINICAL DATA:  Osteomyelitis of left foot EXAM: LEFT FOOT - COMPLETE 3 VIEW COMPARISON:  Radiograph of the left foot dated October 21, 2022 FINDINGS: Postsurgical changes of interval second toe resection. Prior resection of the great toe and third toe no osseous destruction. Mild degenerative changes midfoot. Soft tissue edema of the forefoot. IMPRESSION: 1. Postsurgical changes of interval second toe resection. 2. No osseous destruction to suggest osteomyelitis. Electronically Signed   By: Yetta Glassman M.D.   On: 10/26/2022 14:02   PERIPHERAL VASCULAR CATHETERIZATION  Result Date: 10/25/2022 Images from the original  result were not included.   Patient name: Todd Zhang   MRN: [id number]        DOB: 06-Jun-1946        Sex: male  10/25/2022 Pre-operative Diagnosis: Critical limb ischemia of the left lower extremity with history of remote left lower extremity bypass and concern for bypass stenosis Post-operative diagnosis:  Same Surgeon:  Marty Heck, MD Procedure Performed: 1.  Ultrasound-guided access right common femoral artery 2.  CO2 aortogram with catheter selection of aorta 3.  Left lower extremity arteriogram with selection of third order branches 4.  Left external iliac artery angioplasty with stent placement (7 mm x 40 mm drug-coated Eluvia postdilated with a 6 mm Mustang) 5.  61 minutes of monitored moderate conscious sedation time  Indications: Patient is a 77 year old male well-known to vascular surgery that previously underwent a left common femoral endarterectomy with retrograde left common iliac stenting and a left common femoral to TP trunk bypass with saphenous vein for tissue loss on 05/04/21.  He was seen over the weekend in consultation by Dr. Trula Slade with new wound on his foot.  Noninvasive imaging was concerning for stenosis with monophasic flow down the bypass.  He presents after risks benefits discussed.  Findings:  Aortogram was initially done with CO2.  Due to significant bowel gas we had a hard time visualizing the left iliac stent and ultimately had to do a contrasted aortogram.  This showed that the left common iliac stent is widely patent.  He had a 60% stenosis in the left external iliac artery with two calcified plaques.  This was stented with a 7 mm x 40 mm drug-coated Eluvia postdilated with a 6 mm balloon.   Left lower extremity runoff showed a widely patent common femoral profunda as well as common femoral to TP trunk saphenous vein bypass graft that was also widely patent with two-vessel runoff in the peroneal and posterior tibial artery.             Procedure:  The patient was  identified in the holding area and taken to room 8.  The patient was then placed supine on the table and prepped and draped in the usual sterile fashion.  A time out was called.  Patient received Versed and fentanyl  for conscious moderate sedation.  I was present for all of sedation.  Vital signs were monitored including heart rate, respiratory rate, oxygenation and blood pressure.  Ultrasound was used to evaluate the right common femoral artery.  It was patent .  A digital ultrasound image was acquired.  A micropuncture needle was used to access the right common femoral artery under ultrasound guidance.  An 018 wire was advanced without resistance and a micropuncture sheath was placed.  The 018 wire was removed and a benson wire was placed.  The micropuncture sheath was exchanged for a 5 french sheath.  An omniflush catheter was advanced over the wire to the level of L-1.  An abdominal angiogram was obtained.  Next, using the omniflush catheter and a benson wire, the aortic bifurcation was crossed and the catheter was placed into theleft external iliac artery and left runoff was obtained.  Ultimately after evaluating images elected to intervene on his moderate left external iliac stenosis.  This inflow disease would explain the monophasic waveforms in his bypass.  We then used a Rosen wire down the left leg bypass and exchanged for a long 6 French  sheath in the right groin over the aortic bifurcation.  Patient was given 100 units/kg IV heparin.  I then got hand-injection with contrast to identify the left iliac lesion and this was stented with a 7 mm x 40 mm drug-coated Eluvia postdilated with a 6 mm angioplasty balloon.  Excellent femoral pulse at completion.  I did measure pullback pressure between the existing left common iliac stent and the new left external iliac stent where there was some disease adjacent to the hypo.  The difference was only 10 mmHg and I elected not to stent this segment.  Wires and  catheters were removed and a put a short 6 French sheath in the right common femoral artery.  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  Plan: Patient is optimized after left leg intervention today.  Can proceed with toe amputation.  Plavix statin.   Marty Heck, MD [facility] [place] Office: [phone]   VAS [place] LOWER EXTREMITY ARTERIAL DUPLEX  Result Date: 10/23/2022 LOWER EXTREMITY ARTERIAL DUPLEX STUDY Patient Name:  SLATEN KILDOW  Date of Exam:   10/23/2022 Medical Rec #: [id number]      Accession #:    [id number] Date of Birth: [birth year]     Patient Gender: M Patient Age:   76 years Exam Location:  [facility] Procedure:      VAS [place] LOWER EXTREMITY ARTERIAL DUPLEX Referring Phys: Aldona Bar RHYNE --------------------------------------------------------------------------------  High Risk Factors: Hypertension, hyperlipidemia, Diabetes, past history of                    smoking.  Vascular Interventions: 05/04/2021: Left common iliac artery angioplasty with                         stent placement. Left common femoral endarterectomy with                         profundaplasty and angioplasty. Left below-knee                         popliteal artery and TPT endarterectomy. Left  common femoral to TPT bypass graft. Left great toe                         amputation. Current ABI:            R:0.52 L:0.65 Comparison Study: Prior study done 08/11/21 Performing Technologist: Sharion Dove RVS  Examination Guidelines: A complete evaluation includes B-mode imaging, spectral Doppler, color Doppler, and power Doppler as needed of all accessible portions of each vessel. Bilateral testing is considered an integral part of a complete examination. Limited examinations for reoccurring indications may be performed as noted.   Left Graft #1: Common femoral to TPT bypass graft  +--------------------+--------+---------------+----------+--------------+                     PSV cm/sStenosis       Waveform  Comments       +--------------------+--------+---------------+----------+--------------+ Inflow              44                     monophasic               +--------------------+--------+---------------+----------+--------------+ Proximal Anastomosis123                    monophasic               +--------------------+--------+---------------+----------+--------------+ Proximal Graft      85                     monophasic               +--------------------+--------+---------------+----------+--------------+ Mid Graft           76                     monophasic               +--------------------+--------+---------------+----------+--------------+ Distal Graft        97                     monophasic               +--------------------+--------+---------------+----------+--------------+ Distal Anastomosis  193     50-70% stenosismonophasicturbulent flow +--------------------+--------+---------------+----------+--------------+ Outflow             131                    monophasic               +--------------------+--------+---------------+----------+--------------+   Summary: Left: Since prior study done 08/11/21, waveforms in the bypass graft have changed from biphasic to monophasic and demonstrates a 50-70% stenosis at the distal anastomosis.  See table(s) above for measurements and observations. Electronically signed by Harold Barban MD on 10/23/2022 at 4:51:08 PM.    Final    VAS [place] ABI WITH/WO TBI  Result Date: 10/23/2022  [place] STUDY Patient Name:  DVONTE STERKEL  Date of Exam:   10/23/2022 Medical Rec #: [id number]      Accession #:    [id number] Date of Birth: 20-Apr-1946     Patient Gender: M Patient Age:   82 years Exam Location:  [facility] Procedure:      VAS [place] ABI WITH/WO TBI Referring Phys: Aldona Bar  RHYNE --------------------------------------------------------------------------------  Indications: Ulceration, gangrene, and peripheral artery disease. High Risk  Hypertension, hyperlipidemia, Diabetes, past history of Factors:          smoking.  Vascular Interventions: 05/04/2021: Left common iliac artery angioplasty                         with stent placement. Left common femoral                         endarterectomy with profundaplasty and angioplasty. Left                         below-knee                         popliteal artery and TPT endarterectomy. Left common                         femoral to TPT bypass graft. Left great toe amputation. Limitations: Today's exam was limited due to arrythmia. Comparison Study: Prior ABI done 08/11/21 Performing Technologist: Sharion Dove RVS  Examination Guidelines: A complete evaluation includes at minimum, Doppler waveform signals and systolic blood pressure reading at the level of bilateral brachial, anterior tibial, and posterior tibial arteries, when vessel segments are accessible. Bilateral testing is considered an integral part of a complete examination. Photoelectric Plethysmograph (PPG) waveforms and toe systolic pressure readings are included as required and additional duplex testing as needed. Limited examinations for reoccurring indications may be performed as noted.  ABI Findings: +---------+------------------+-----+----------+--------+ Right    Rt Pressure (mmHg)IndexWaveform  Comment  +---------+------------------+-----+----------+--------+ Brachial 115                    triphasic          +---------+------------------+-----+----------+--------+ PTA      46                0.38 monophasic         +---------+------------------+-----+----------+--------+ DP       64                0.52 monophasic         +---------+------------------+-----+----------+--------+ Great Toe27                0.22                     +---------+------------------+-----+----------+--------+ +--------+------------------+-----+----------+-------+ Left    Lt Pressure (mmHg)IndexWaveform  Comment +--------+------------------+-----+----------+-------+ [id number]                    triphasic         +--------+------------------+-----+----------+-------+ PTA     79                0.65 monophasic        +--------+------------------+-----+----------+-------+ DP      66                0.54 monophasic        +--------+------------------+-----+----------+-------+ +-------+-----------+-----------+------------+------------+ ABI/TBIToday's ABIToday's TBIPrevious ABIPrevious TBI +-------+-----------+-----------+------------+------------+ Right  0.52       0.22       0.54        0.52         +-------+-----------+-----------+------------+------------+ Left   0.65       amputation 0.94        amputation   +-------+-----------+-----------+------------+------------+ Right TBI appears decreased and ABIs appear essentially unchanged compared to  prior study on 08/11/21. Left ABIs appear decreased compared to prior study on 08/11/21.  Summary: Right: Resting right ankle-brachial index indicates moderate right lower extremity arterial disease. The right toe-brachial index is abnormal. Left: Resting left ankle-brachial index indicates moderate left lower extremity arterial disease. *See table(s) above for measurements and observations.  Electronically signed by Harold Barban MD on 10/23/2022 at 4:50:07 PM.    Final    MR FOOT LEFT WO CONTRAST  Result Date: 10/22/2022 CLINICAL DATA:  Osteomyelitis, foot EXAM: MRI OF THE LEFT FOOT WITHOUT CONTRAST TECHNIQUE: Multiplanar, multisequence MR imaging of the left forefoot was performed. No intravenous contrast was administered. COMPARISON:  X-ray 10/21/2022 FINDINGS: Bones/Joint/Cartilage Destructive changes of the middle and distal phalanx of the second toe are compatible with acute  osteomyelitis. Extensive bone marrow edema with intermediate to low T1 signal throughout the proximal phalanx of the second toe is also compatible with osteomyelitis. Mild subchondral marrow edema within the second metatarsal head may be degenerative or reflect early changes of osteomyelitis. Prior great toe and third toe amputation at the MTP joint level. Bone marrow edema within the first and third metatarsal heads without definite confluent low T1 signal change, nonspecific but may represent early acute osteomyelitis. There is also bone marrow edema within the distal phalanx of the fourth toe which may reflect contusion, reactive osteitis or early osteomyelitis. No fracture or dislocation. Advanced degenerative changes of the second MTP joint. Ligaments No acute collateral ligament injury. Muscles and Tendons Amputation changes of the musculotendinous structures. Diffuse changes of denervation and/or myositis. Mild flexor tenosynovitis of the second toe. Soft tissues Prominent soft tissue swelling with irregularity at the second toe distally. There is also prominent soft tissue swelling distal to the first metatarsal head. No organized or drainable fluid collections. IMPRESSION: 1. Acute osteomyelitis throughout the second toe extending to the second MTP joint level. Mild subchondral marrow edema within the second metatarsal head may be degenerative or reflect early changes of osteomyelitis. 2. Bone marrow edema within the first and third metatarsal heads are nonspecific but may represent early acute osteomyelitis. 3. Bone marrow edema within the distal phalanx of the fourth toe which may reflect contusion, reactive osteitis, or early osteomyelitis. 4. Mild flexor tenosynovitis of the second toe. Electronically Signed   By: Davina Poke D.O.   On: 10/22/2022 20:35    Labs: BNP (last 3 results) No results for input(s): "BNP" in the last 8760 hours. Basic Metabolic Panel: Recent Labs  Lab 10/24/22 0338  10/25/22 0344 10/26/22 0152 10/27/22 0233 10/28/22 0328  NA 133* 133* 132* 135 135  K 3.8 3.9 3.8 3.8 3.8  CL 97* 98 100 101 101  CO2 25 24 26 24 23  $ GLUCOSE 136* 210* 211* 125* 117*  BUN 26* 24* 22 19 15  $ CREATININE 1.65* 1.53* 1.54* 1.40* 1.35*  CALCIUM 8.7* 8.7* 8.6* 8.5* 8.9   Liver Function Tests: No results for input(s): "AST", "ALT", "ALKPHOS", "BILITOT", "PROT", "ALBUMIN" in the last 168 hours. No results for input(s): "LIPASE", "AMYLASE" in the last 168 hours. No results for input(s): "AMMONIA" in the last 168 hours. CBC: Recent Labs  Lab 10/24/22 0338 10/25/22 0344 10/26/22 0152 10/27/22 0233 10/28/22 0328  WBC 9.6 7.8 7.3 8.4 7.6  HGB 10.7* 11.4* 11.0* 10.8* 11.5*  HCT 31.4* 32.9* 32.0* 30.6* 33.0*  MCV 88.2 88.2 87.9 88.4 87.5  PLT 307 348 344 339 352    Recent Labs  Lab 10/27/22 1220 10/27/22 1633 10/27/22 2050 10/28/22 0745 10/28/22  [place]* 135* 171*   Anemia work up No results for input(s): "VITAMINB12", "FOLATE", "FERRITIN", "TIBC", "IRON", "RETICCTPCT" in the last 72 hours. Urinalysis    Component Value Date/Time   COLORURINE YELLOW 04/24/2021 1029   APPEARANCEUR HAZY (A) 04/24/2021 1029   LABSPEC 1.018 04/24/2021 1029   PHURINE 5.0 04/24/2021 1029   GLUCOSEU >=500 (A) 04/24/2021 1029   HGBUR MODERATE (A) 04/24/2021 1029   BILIRUBINUR NEGATIVE 04/24/2021 1029   BILIRUBINUR neg 03/04/2020 1437   KETONESUR NEGATIVE 04/24/2021 1029   PROTEINUR 30 (A) 04/24/2021 1029   UROBILINOGEN negative (A) 03/04/2020 1437   NITRITE NEGATIVE 04/24/2021 1029   LEUKOCYTESUR NEGATIVE 04/24/2021 1029   Sepsis Labs Recent Labs  Lab 10/25/22 0344 10/26/22 0152 10/27/22 0233 10/28/22 0328  WBC 7.8 7.3 8.4 7.6   Microbiology Recent Results (from the past 240 hour(s))  Aerobic/Anaerobic Culture w Gram Stain (surgical/deep wound)     Status: None (Preliminary result)   Collection Time: 10/26/22  1:13 PM   Specimen: Wound  Result Value  Ref Range Status   Specimen Description WOUND  Final   Special Requests NONE  Final   Gram Stain NO WBC SEEN NO ORGANISMS SEEN   Final   Culture   Final    CULTURE REINCUBATED FOR BETTER GROWTH Performed at [facility], [address], [place], [place] [address]    Report Status PENDING  Incomplete     Time coordinating discharge: 25 minutes  SIGNED: Antonieta Pert, MD  [facility] 10/28/2022, 11:35 AM  If 7PM-7AM, please contact night-coverage [email or web address]

## 2022-10-27 NOTE — Evaluation (Signed)
Physical Therapy Evaluation Patient Details Name: Todd Zhang MRN: [id number] DOB: 02-10-1946 Today's Date: 10/27/2022  History of Present Illness  77 y.o. 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 Past medical history of atrial fibrillation, hypertension, dyslipidemia, diabetes mellitus, s/p left CFA endarterectomy, L below-knee popliteal with TP endarterectomy, left CFA-TP trunk bypass, and left great toe amputation in 2022.  Clinical Impression   Pt presents with impaired balance, impaired gait, and decreased activity tolerance. Pt to benefit from acute PT to address deficits. Pt ambulated hallway distance with use of RW and increased time, pt cued for heel WB LLE. Pt states he will have assist from sister as needed at d/c. PT to progress mobility as tolerated, and will continue to follow acutely.         Recommendations for follow up therapy are one component of a multi-disciplinary discharge planning process, led by the attending physician.  Recommendations may be updated based on patient status, additional functional criteria and insurance authorization.  Follow Up Recommendations Home health PT      Assistance Recommended at Discharge Intermittent Supervision/Assistance  Patient can return home with the following  A little help with walking and/or transfers;A little help with bathing/dressing/bathroom    Equipment Recommendations None recommended by PT  Recommendations for Other Services       Functional Status Assessment Patient has had a recent decline in their functional status and demonstrates the ability to make significant improvements in function in a reasonable and predictable amount of time.     Precautions / Restrictions Precautions Precautions: Fall (pt refuses bed/chair alarm) Required Braces or Orthoses: Other Brace Other Brace: darco shoe LLE Restrictions Weight Bearing Restrictions:  Yes Other Position/Activity Restrictions: WB through heel in darco shoe LLE      Mobility  Bed Mobility Overal bed mobility: Modified Independent                  Transfers Overall transfer level: Needs assistance Equipment used: Rolling walker (2 wheels) Transfers: Sit to/from Stand Sit to Stand: Supervision           General transfer comment: for safety, cues for safe hand placement when rising and sitting    Ambulation/Gait Ambulation/Gait assistance: Supervision Gait Distance (Feet): 260 Feet Assistive device: Rolling walker (2 wheels) Gait Pattern/deviations: Step-through pattern, Decreased stride length, Trunk flexed Gait velocity: decr     General Gait Details: cues for heel WB LLE, proper use of RW  Stairs            Wheelchair Mobility    Modified Rankin (Stroke Patients Only)       Balance Overall balance assessment: Needs assistance, History of Falls (last fall 1 year ago - tripped over a throw rug)   Sitting balance-Leahy Scale: Good       Standing balance-Leahy Scale: Fair Standing balance comment: feels "off balance" due to difference in shoe height                             Pertinent Vitals/Pain Pain Assessment Pain Assessment: No/denies pain    Home Living Family/patient expects to be discharged to:: Private residence Living Arrangements: Alone Available Help at Discharge: Family;Available PRN/intermittently Type of Home: Apartment Home Access: Level entry Entrance Stairs-Rails: Right Entrance Stairs-Number of Steps: pt declined to answer   Home Layout: One level Home Equipment: Conservation officer, nature (2 wheels);Cane -  single point;Grab bars - toilet;Grab bars - tub/shower      Prior Function Prior Level of Function : Independent/Modified Independent;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)             Mobility Comments: uses a cane as needed "one I found" ADLs Comments: does his own  shopping; says sister takes thim and reminds him of appointments     Hand Dominance   Dominant Hand: Right    Extremity/Trunk Assessment   Upper Extremity Assessment Upper Extremity Assessment: Defer to OT evaluation    Lower Extremity Assessment Lower Extremity Assessment: Overall WFL for tasks assessed    Cervical / Trunk Assessment Cervical / Trunk Assessment: Normal  Communication   Communication: No difficulties  Cognition Arousal/Alertness: Awake/alert Behavior During Therapy: Agitated, WFL for tasks assessed/performed Overall Cognitive Status: No family/caregiver present to determine baseline cognitive functioning                                 General Comments: irritable, verging on agitated at times but very unpredictable        General Comments      Exercises     Assessment/Plan    PT Assessment Patient needs continued PT services  PT Problem List Decreased mobility;Decreased activity tolerance;Decreased balance;Decreased knowledge of use of DME;Pain;Decreased safety awareness       PT Treatment Interventions DME instruction;Therapeutic activities;Gait training;Therapeutic exercise;Patient/family education;Balance training;Functional mobility training    PT Goals (Current goals can be found in the Care Plan section)  Acute Rehab PT Goals Patient Stated Goal: home PT Goal Formulation: With patient Time For Goal Achievement: 11/10/22 Potential to Achieve Goals: Good    Frequency Min 3X/week     Co-evaluation               AM-PAC PT "6 Clicks" Mobility  Outcome Measure Help needed turning from your back to your side while in a flat bed without using bedrails?: A Little Help needed moving from lying on your back to sitting on the side of a flat bed without using bedrails?: A Little Help needed moving to and from a bed to a chair (including a wheelchair)?: A Little Help needed standing up from a chair using your arms (e.g.,  wheelchair or bedside chair)?: A Little Help needed to walk in hospital room?: A Little Help needed climbing 3-5 steps with a railing? : A Little 6 Click Score: 18    End of Session Equipment Utilized During Treatment: Other (comment) (darco shoe) Activity Tolerance: Patient tolerated treatment well Patient left: in bed;with call bell/phone within reach;Other (comment) (pt refusing bed alarms, PT instructed pt to call for assist when ready to get up) Nurse Communication: Mobility status PT Visit Diagnosis: Other abnormalities of gait and mobility (R26.89)    Time: [id number] PT Time Calculation (min) (ACUTE ONLY): 12 min   Charges:   PT Evaluation $PT Eval Low Complexity: 1 Low         Pamula Luther S, PT DPT Acute Rehabilitation Services Pager [phone]  Office [phone]   Manitou E Ruffin Pyo 10/27/2022, 5:30 PM

## 2022-10-27 NOTE — Progress Notes (Signed)
  Progress Note    10/27/2022 8:02 AM 1 Day Post-Op  Subjective:  doing well, denies pain    Vitals:   10/27/22 0400 10/27/22 0431  BP:  130/62  Pulse: 89 89  Resp:  16  Temp:  97.9 F (36.6 C)  SpO2: 96% 98%    Physical Exam: Lungs:  nonlabored Incisions:  R groin cath site soft without hematoma Extremities:  L foot wrapped. Palpable PT pulse   CBC    Component Value Date/Time   WBC 8.4 10/27/2022 0233   RBC 3.46 (L) 10/27/2022 0233   HGB 10.8 (L) 10/27/2022 0233   HGB 13.6 08/03/2022 1030   HCT 30.6 (L) 10/27/2022 0233   HCT 40.1 08/03/2022 1030   PLT 339 10/27/2022 0233   PLT 317 08/03/2022 1030   MCV 88.4 10/27/2022 0233   MCV 90 08/03/2022 1030   MCH 31.2 10/27/2022 0233   MCHC 35.3 10/27/2022 0233   RDW 13.1 10/27/2022 0233   RDW 12.5 08/03/2022 1030   LYMPHSABS 1.8 08/03/2022 1030   MONOABS 0.5 05/05/2021 1231   EOSABS 0.1 08/03/2022 1030   BASOSABS 0.1 08/03/2022 1030    BMET    Component Value Date/Time   NA 135 10/27/2022 0233   NA 136 08/03/2022 1030   K 3.8 10/27/2022 0233   CL 101 10/27/2022 0233   CO2 24 10/27/2022 0233   GLUCOSE 125 (H) 10/27/2022 0233   BUN 19 10/27/2022 0233   BUN 37 (H) 08/03/2022 1030   CREATININE 1.40 (H) 10/27/2022 0233   CALCIUM 8.5 (L) 10/27/2022 0233   GFRNONAA 52 (L) 10/27/2022 0233   GFRAA 44 (L) 10/17/2020 1434    INR    Component Value Date/Time   INR 1.4 (H) 04/24/2021 1041     Intake/Output Summary (Last 24 hours) at 10/27/2022 0802 Last data filed at 10/27/2022 0700 Gross per 24 hour  Intake 1515.98 ml  Output 505 ml  Net 1010.98 ml      Assessment/Plan:  77 y.o. 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 Pain is well controlled and foot is wrapped. Wound care per podiatry -Easily palpable L PT -Patient is clear for discharge from vascular standpoint. Will arrange 1 month follow up with studies -Continue plavix and  statin  Vicente Serene, PA-C [facility] [phone] 10/27/2022 8:02 AM

## 2022-10-27 NOTE — Plan of Care (Signed)
  Problem: Education: Goal: Knowledge of General Education information will improve Description: Including pain rating scale, medication(s)/side effects and non-pharmacologic comfort measures Outcome: Progressing   Problem: Health Behavior/Discharge Planning: Goal: Ability to manage health-related needs will improve Outcome: Progressing   Problem: Clinical Measurements: Goal: Cardiovascular complication will be avoided Outcome: Progressing   Problem: Activity: Goal: Risk for activity intolerance will decrease Outcome: Progressing   Problem: Pain Managment: Goal: General experience of comfort will improve Outcome: Progressing   Problem: Safety: Goal: Ability to remain free from injury will improve Outcome: Progressing   Problem: Skin Integrity: Goal: Risk for impaired skin integrity will decrease Outcome: Progressing   Problem: Health Behavior/Discharge Planning: Goal: Ability to safely manage health-related needs after discharge will improve Outcome: Progressing

## 2022-10-27 NOTE — Progress Notes (Signed)
Subjective: 1 Day Post-Op Procedure(s) (LRB): AMPUTATION LEFT SECOND TOE (Left) DOS: 10/26/2022  Resting comfortably in bed.  No new complaints at this time.  Dressings intact.  Objective: Vital signs in last 24 hours: Temp:  [97.4 F (36.3 C)-98.5 F (36.9 C)] 98.5 F (36.9 C) (02/14 1935) Pulse Rate:  [34-92] 84 (02/14 1935) Resp:  [16-18] 18 (02/14 1935) BP: (122-133)/(62-74) 122/66 (02/14 1935) SpO2:  [93 %-100 %] 96 % (02/14 1935)   Recent Labs    10/25/22 0344 10/26/22 0152 10/27/22 0233  HGB 11.4* 11.0* 10.8*   Recent Labs    10/26/22 0152 10/27/22 0233  WBC 7.3 8.4  RBC 3.64* 3.46*  HCT 32.0* 30.6*  PLT 344 339   Recent Labs    10/26/22 0152 10/27/22 0233  NA 132* 135  K 3.8 3.8  CL 100 101  CO2 26 24  BUN 22 19  CREATININE 1.54* 1.40*  GLUCOSE 211* 125*  CALCIUM 8.6* 8.5*   Amputation site well coapted.  No active bleeding noted.   Assessment/Plan: 1 Day Post-Op Procedure(s) (LRB): AMPUTATION LEFT SECOND TOE (Left) DOS: 10/26/2022 s/p: L external iliac artery stenting.  10/25/2022 -Dressings changed.  Leave clean dry and intact until follow-up in the office -WBAT heel touch only in a postop shoe with the assistance of a walker -Okay to transition to oral antibiotics at discharge from a podiatry standpoint.  Recommend 7 days -Follow-up in office 1 week postdischarge -Podiatry will sign off  Edrick Kins 10/27/2022, 7:43 PM  Edrick Kins, DPM [facility]  Dr. Edrick Kins, DPM    [address], [place] [address]                Office [phone]  Fax [phone]

## 2022-10-27 NOTE — Progress Notes (Signed)
Orthopedic Tech Progress Note Patient Details:  NAHUEL ENCE 1946/03/20 [id number]  Ortho Devices Type of Ortho Device: Darco shoe Ortho Device/Splint Location: LLE Ortho Device/Splint Interventions: Ordered   Post Interventions Patient Tolerated: Well Instructions Provided: Care of device  Janit Pagan 10/27/2022, 8:15 AM

## 2022-10-28 ENCOUNTER — Other Ambulatory Visit (HOSPITAL_COMMUNITY): Payer: Self-pay

## 2022-10-28 DIAGNOSIS — M869 Osteomyelitis, unspecified: Secondary | ICD-10-CM | POA: Diagnosis not present

## 2022-10-28 LAB — CBC
HCT: 33 % — ABNORMAL LOW (ref 39.0–52.0)
Hemoglobin: 11.5 g/dL — ABNORMAL LOW (ref 13.0–17.0)
MCH: 30.5 pg (ref 26.0–34.0)
MCHC: 34.8 g/dL (ref 30.0–36.0)
MCV: 87.5 fL (ref 80.0–100.0)
Platelets: 352 10*3/uL (ref 150–400)
RBC: 3.77 MIL/uL — ABNORMAL LOW (ref 4.22–5.81)
RDW: 13 % (ref 11.5–15.5)
WBC: 7.6 10*3/uL (ref 4.0–10.5)
nRBC: 0 % (ref 0.0–0.2)

## 2022-10-28 LAB — BASIC METABOLIC PANEL
Anion gap: 11 (ref 5–15)
BUN: 15 mg/dL (ref 8–23)
CO2: 23 mmol/L (ref 22–32)
Calcium: 8.9 mg/dL (ref 8.9–10.3)
Chloride: 101 mmol/L (ref 98–111)
Creatinine, Ser: 1.35 mg/dL — ABNORMAL HIGH (ref 0.61–1.24)
GFR, Estimated: 54 mL/min — ABNORMAL LOW (ref >60)
Glucose, Bld: 117 mg/dL — ABNORMAL HIGH (ref 70–99)
Potassium: 3.8 mmol/L (ref 3.5–5.1)
Sodium: 135 mmol/L (ref 135–145)

## 2022-10-28 LAB — GLUCOSE, CAPILLARY
Glucose-Capillary: 135 mg/dL — ABNORMAL HIGH (ref 70–99)
Glucose-Capillary: 171 mg/dL — ABNORMAL HIGH (ref 70–99)

## 2022-10-28 MED ORDER — CEFADROXIL 500 MG PO CAPS
500.0000 mg | ORAL_CAPSULE | Freq: Two times a day (BID) | ORAL | 0 refills | Status: AC
  Filled 2022-10-28: qty 26, 13d supply, fill #0

## 2022-10-28 MED ORDER — DOXYCYCLINE HYCLATE 100 MG PO TABS
100.0000 mg | ORAL_TABLET | Freq: Two times a day (BID) | ORAL | 0 refills | Status: AC
  Filled 2022-10-28: qty 26, 13d supply, fill #0

## 2022-10-28 NOTE — Progress Notes (Addendum)
Pain on left foot 8/10, aching. Refused PRN Oxycodone. Patient said, "Oxycodone didn't do anything to my pain. I take Percocet 5 mg at home for my pain". Offered Tylenol but refused. Notified V. Marlowe Sax, MD . Percocet not reflected on home meds.

## 2022-10-28 NOTE — Progress Notes (Addendum)
Update- CSW received call from [facility] [address] informed CSW patient now wants to return back to his home address [address] [place] [address]. CSW confirmed with patient.CSW provided taxi voucher to [employer]   CSW received consult for assist with transportation for patient. Patient needs transportation to his sisters house Katharine Look. CSW spoke with Katharine Look and confirmed her address. Katharine Look informed CSW she will be home by 4:00pm.CSW informed patients sister that we will make sure patient arrives around that time or after. All questions answered. No further questions reported at this time. [facility] Ha[facility] informed once patient medically ready for dc to call for transport around 3:30pm or after, so that patient can arrive by the time his sister is home. CSW provided Financial controller with taxi voucher. Patient will transport by [employer]. Patient signed rider waiver. Signed rider waiver placed in patients hard chart.

## 2022-10-28 NOTE — TOC Transition Note (Signed)
Transition of Care [facility]) - CM/SW Discharge Note   Patient Details  Name: Todd Zhang MRN: [id number] Date of Birth: 1945/10/12  [facility] CM/SW Contact:  Bethena Roys, RN Phone Number: 10/28/2022, 10:37 AM   Clinical Narrative:  Case Manager spoke with patient regarding home health services. Patient has declined services PT/OT states he is in the process of moving and will not be abel to accommodate the services. Case Manager did educate the patient regarding he will need to contact his PCP vs podiatry for [facility] orders in the future. Patient declined the DME Shower chair due to out of pocket fee. No further needs identified at this time.     Final next level of care: Home/Self Care Barriers to Discharge: No Barriers Identified  Discharge Plan and Services Additional resources added to the After Visit Summary for   In-house Referral: NA Discharge Planning Services: CM Consult Post Acute Care Choice: Home Health            DME Agency: NA       HH Arranged: Patient Refused [facility]   Social Determinants of Health (SDOH) Interventions SDOH Screenings   Food Insecurity: No Food Insecurity (10/23/2022)  Housing: Low Risk  (10/23/2022)  Recent Concern: Housing - Medium Risk (09/21/2022)  Transportation Needs: No Transportation Needs (10/23/2022)  Utilities: Not At Risk (10/23/2022)  Alcohol Screen: Low Risk  (09/21/2022)  Depression (PHQ2-9): Low Risk  (09/21/2022)  Financial Resource Strain: Low Risk  (09/21/2022)  Physical Activity: Inactive (09/21/2022)  Social Connections: Moderately Integrated (09/21/2022)  Stress: Stress Concern Present (09/21/2022)  Tobacco Use: Medium Risk (10/27/2022)     Readmission Risk Interventions     No data to display

## 2022-10-28 NOTE — Progress Notes (Signed)
Patient refusing to wear ortho boot when ambulating to bathroom. Educated on importance of boot, patient continues to refuse to wear and not accepting assistance with ambulation.

## 2022-10-28 NOTE — Progress Notes (Signed)
Occupational Therapy Treatment Patient Details Name: Todd Zhang MRN: [id number] DOB: November 30, 1945 Today's Date: 10/28/2022   History of present illness 77 y.o. 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 Past medical history of atrial fibrillation, hypertension, dyslipidemia, diabetes mellitus, s/p left CFA endarterectomy, L below-knee popliteal with TP endarterectomy, left CFA-TP trunk bypass, and left great toe amputation in 2022.   OT comments  Ambulated and completed ADL tasks @ modified independent level using RW. Recommend follow up with [facility] OT/PT for home safety. No further acute OT needs. OT signing off.  Recommend pt continue to ambulate with staff and mobility specialist using a RW.    Recommendations for follow up therapy are one component of a multi-disciplinary discharge planning process, led by the attending physician.  Recommendations may be updated based on patient status, additional functional criteria and insurance authorization.    Follow Up Recommendations  Home health OT     Assistance Recommended at Discharge Intermittent Supervision/Assistance  Patient can return home with the following  Assist for transportation;Assistance with cooking/housework;Two people to help with bathing/dressing/bathroom   Equipment Recommendations  Tub/shower seat    Recommendations for Other Services      Precautions / Restrictions Precautions Precautions: Fall (pt refuses bed/chair alarm) Required Braces or Orthoses: Other Brace Other Brace: darco shoe LLE Restrictions Weight Bearing Restrictions: Yes Other Position/Activity Restrictions: WB through heel in darco shoe LLE       Mobility Bed Mobility Overal bed mobility: Modified Independent                  Transfers Overall transfer level: Modified independent Equipment used: Rolling walker (2 wheels)                     Balance  Overall balance assessment: Needs assistance, History of Falls (last fall 1 year ago - tripped over a throw rug)   Sitting balance-Leahy Scale: Good       Standing balance-Leahy Scale: Fair Standing balance comment: feels "off balance" due to difference in shoe height                           ADL either performed or assessed with clinical judgement   ADL                                       Functional mobility during ADLs: Modified independent General ADL Comments: educated on strategies to reduce risk of falls and compensatory strategies for ADL. Pt verbalized understanding    Extremity/Trunk Assessment Upper Extremity Assessment Upper Extremity Assessment: Overall WFL for tasks assessed            Vision       Perception     Praxis      Cognition Arousal/Alertness: Awake/alert Behavior During Therapy: Agitated Overall Cognitive Status: No family/caregiver present to determine baseline cognitive functioning                                 General Comments: most likely baesline        Exercises      Shoulder Instructions       General Comments      Pertinent Vitals/ Pain  Pain Assessment Pain Assessment: Faces Faces Pain Scale: Hurts a little bit Pain Location: R LE Pain Descriptors / Indicators: Discomfort Pain Intervention(s): Limited activity within patient's tolerance (has not agreed to take pain medicine)  Home Living                                          Prior Functioning/Environment              Frequency  Min 2X/week        Progress Toward Goals  OT Goals(current goals can now be found in the care plan section)  Progress towards OT goals: Goals met/education completed, patient discharged from OT (recommend continuing with HHOT)  Acute Rehab OT Goals Patient Stated Goal: to go home OT Goal Formulation: All assessment and education complete, DC therapy (pt not  agreeable at the moment ot goal setting) Time For Goal Achievement: 11/10/22 Potential to Achieve Goals: Good ADL Goals Pt Will Perform Lower Body Bathing: with modified independence;sit to/from stand Pt Will Perform Lower Body Dressing: with modified independence;sit to/from stand Pt Will Transfer to Toilet: with modified independence;ambulating Additional ADL Goal #1: Pt will independently verbalize 3 strategies to reduce risk of falls  Plan All goals met and education completed, patient discharged from OT services    Co-evaluation                 AM-PAC OT "6 Clicks" Daily Activity     Outcome Measure   Help from another person eating meals?: None Help from another person taking care of personal grooming?: None Help from another person toileting, which includes using toliet, bedpan, or urinal?: None Help from another person bathing (including washing, rinsing, drying)?: None Help from another person to put on and taking off regular upper body clothing?: None Help from another person to put on and taking off regular lower body clothing?: None 6 Click Score: 24    End of Session Equipment Utilized During Treatment: Rolling walker (2 wheels);Gait belt  OT Visit Diagnosis: Unsteadiness on feet (R26.81);Other abnormalities of gait and mobility (R26.89);Muscle weakness (generalized) (M62.81);Pain;History of falling (Z91.81) Pain - Right/Left: Right Pain - part of body: Ankle and joints of foot   Activity Tolerance Patient tolerated treatment well;Other (comment) (became agitated at end of session)   Patient Left with call bell/phone within reach   Nurse Communication Mobility status        Time: [id number] OT Time Calculation (min): 14 min  Charges: OT General Charges $OT Visit: 1 Visit OT Treatments $Self Care/Home Management : 8-22 mins  Maurie Boettcher, OT/L   Acute OT Clinical Specialist [place] Pager [phone] Office [phone]    [facility] 10/28/2022, 10:10 AM

## 2022-10-29 ENCOUNTER — Telehealth: Payer: Self-pay | Admitting: Physician Assistant

## 2022-10-29 NOTE — Telephone Encounter (Signed)
-----   Message from [facility], [place] sent at 10/26/2022 10:52 AM EST ----- S/p L EIA angioplasty and stenting, 2/12  Please arrange follow up with PA in 1 month with ABIs, LLE BPG duplex, and left aortoiliac duplex study on [place] office day

## 2022-10-31 LAB — AEROBIC/ANAEROBIC CULTURE W GRAM STAIN (SURGICAL/DEEP WOUND): Gram Stain: NONE SEEN

## 2022-11-04 ENCOUNTER — Encounter: Payer: Self-pay | Admitting: Nurse Practitioner

## 2022-11-04 ENCOUNTER — Ambulatory Visit: Payer: Medicare HMO | Admitting: Nurse Practitioner

## 2022-11-05 ENCOUNTER — Telehealth: Payer: Medicare HMO

## 2022-11-05 ENCOUNTER — Telehealth: Payer: Self-pay | Admitting: *Deleted

## 2022-11-05 NOTE — Telephone Encounter (Signed)
   CCM RN Visit Note   11/05/22 Name: Todd Zhang MRN: [id number]      DOB: [birth year]  Subjective: Todd Zhang is a 77 y.o. year old male who is a primary care patient of [place] FNP. The patient was referred to the Chronic Care Management team for assistance with care management needs subsequent to provider initiation of CCM services and plan of care.      An unsuccessful telephone outreach was attempted today to contact the patient about Chronic Care Management needs.    Plan:Telephone follow up appointment with care management team member scheduled for:  upon care guide rescheduling.  Jacqlyn Larsen RNC, BSN RN Case Manager [place] [phone]

## 2022-11-05 NOTE — Telephone Encounter (Signed)
   CCM RN Visit Note   11/05/22 Name: Todd Zhang MRN: [id number]      DOB: [birth year]  Subjective: Todd Zhang is a 77 y.o. year old male who is a primary care patient of [place] FNP. The patient was referred to the Chronic Care Management team for assistance with care management needs subsequent to provider initiation of CCM services and plan of care.      RN care manager spoke with patient's sister Todd Zhang who reports she is not with patient at present and she will give a message to patient to call RN care manager back.  Plan:Telephone follow up appointment with care management team member scheduled for:  upon care guide rescheduling.  Jacqlyn Larsen RNC, BSN RN Case Manager [place] [phone]

## 2022-11-09 ENCOUNTER — Encounter: Payer: Medicare HMO | Admitting: Podiatry

## 2022-11-11 ENCOUNTER — Ambulatory Visit (INDEPENDENT_AMBULATORY_CARE_PROVIDER_SITE_OTHER): Payer: Medicare HMO | Admitting: Podiatry

## 2022-11-11 ENCOUNTER — Encounter: Payer: Self-pay | Admitting: Podiatry

## 2022-11-11 ENCOUNTER — Ambulatory Visit (INDEPENDENT_AMBULATORY_CARE_PROVIDER_SITE_OTHER): Payer: Medicare HMO

## 2022-11-11 DIAGNOSIS — T81509A Unspecified complication of foreign body accidentally left in body following unspecified procedure, initial encounter: Secondary | ICD-10-CM

## 2022-11-11 DIAGNOSIS — Z89422 Acquired absence of other left toe(s): Secondary | ICD-10-CM | POA: Diagnosis not present

## 2022-11-11 DIAGNOSIS — Z9889 Other specified postprocedural states: Secondary | ICD-10-CM | POA: Diagnosis not present

## 2022-11-11 DIAGNOSIS — M85672 Other cyst of bone, left ankle and foot: Secondary | ICD-10-CM | POA: Diagnosis not present

## 2022-11-11 NOTE — Progress Notes (Signed)
Subjective:  Patient ID: Todd Zhang, male    DOB: 20-May-1946,  MRN: [id number]  No chief complaint on file.   DOS: 10/26/22 Procedure: Left second digit amputation  77 y.o. male returns for POV#1. Relates doing well with minimal pain.  Has kept dressing clean. Just finished the doxycycline.   Review of Systems: Negative except as noted in the HPI. Denies N/V/F/Ch.  Past Medical History:  Diagnosis Date   Atrial fibrillation ([place])    Cervical spinal stenosis    DDD (degenerative disc disease), cervical    DDD (degenerative disc disease), lumbar    Diabetes mellitus without complication (HCC)    Dyspnea    Hyperlipidemia    Hypertension    Peripheral artery disease (HCC)     Current Outpatient Medications:    alendronate (FOSAMAX) 70 MG tablet, TAKE 1 TABLET BY MOUTH ONCE WEEKLY ON SUNDAY ON ON AN EMPTY STOMACH with full glass of water (Patient taking differently: Take 70 mg by mouth once a week.), Disp: 12 tablet, Rfl: 0   atorvastatin (LIPITOR) 40 MG tablet, Take 1 tablet (40 mg total) by mouth daily., Disp: 90 tablet, Rfl: 1   clopidogrel (PLAVIX) 75 MG tablet, Take 1 tablet (75 mg total) by mouth daily., Disp: 90 tablet, Rfl: 1   ezetimibe (ZETIA) 10 MG tablet, Take 1 tablet (10 mg total) by mouth daily., Disp: 90 tablet, Rfl: 3   fenofibrate (TRICOR) 145 MG tablet, Take 1 tablet (145 mg total) by mouth daily., Disp: 90 tablet, Rfl: 1   furosemide (LASIX) 40 MG tablet, Take 1 tablet (40 mg total) by mouth 2 (two) times daily., Disp: 60 tablet, Rfl: 3   gabapentin (NEURONTIN) 300 MG capsule, Take 1 capsule (300 mg total) by mouth 4 (four) times daily. TAKE 1 CAPSULE BY MOUTH THREE TIMES DAILY FOR PAIN, Disp: 120 capsule, Rfl: 5   glucose blood (ACCU-CHEK AVIVA PLUS) test strip, CHECK BLOOD SUGAR FOUR TIMES DAILY Dx E11.40, Disp: 400 strip, Rfl: 3   insulin regular (HUMULIN R) 100 units/mL injection, INJECT 10 UNITS INTO THE SKIN THREE TIMES DAILY BEFORE MEALS (Patient taking  differently: Inject 10 Units into the skin 3 (three) times daily before meals. Per sliding scale), Disp: 30 mL, Rfl: 1   Insulin Syringe-Needle U-100 (GLOBAL INJECT EASE INSULIN SYR) 31G X 5/16" 0.3 ML MISC, USE WITH INSULIN EVERY DAY Dx E11.40, Disp: 100 each, Rfl: 3   metFORMIN (GLUCOPHAGE) 500 MG tablet, Take 1 tablet (500 mg total) by mouth 2 (two) times daily., Disp: 180 tablet, Rfl: 0   metoprolol tartrate (LOPRESSOR) 25 MG tablet, Take 1 tablet (25 mg total) by mouth 2 (two) times daily., Disp: 180 tablet, Rfl: 3   nitroGLYCERIN (NITROSTAT) 0.4 MG SL tablet, DISSOLVE 1 TABLET UNDER THE TONGUE EVERY 5 MINUTES AS NEEDED FOR CHEST PAIN. DO NOT EXCEED A TOTAL OF 3 DOSES IN 15 MINUTES. (Patient taking differently: Place 0.4 mg under the tongue every 5 (five) minutes as needed for chest pain.), Disp: 25 tablet, Rfl: 2   pantoprazole (PROTONIX) 40 MG tablet, Take 1 tablet (40 mg total) by mouth daily., Disp: 90 tablet, Rfl: 1   tamsulosin (FLOMAX) 0.4 MG CAPS capsule, Take 1 capsule (0.4 mg total) by mouth daily., Disp: 90 capsule, Rfl: 1  Social History   Tobacco Use  Smoking Status Former   Packs/day: 1.00   Types: Cigarettes   Start date: 06/19/1966   Quit date: 06/20/2011   Years since quitting: 11.4   Passive  exposure: Past  Smokeless Tobacco Never    Allergies  Allergen Reactions   Asa [Aspirin] Hives   Penicillins Hives    Hives + vomiting as an adult  - tolerated Ceftriaxone 04/2021   Tetanus Toxoid     unknown   Objective:  There were no vitals filed for this visit. There is no height or weight on file to calculate BMI. Constitutional Well developed. Well nourished.  Vascular Foot warm and well perfused. Capillary refill normal to all digits.   Neurologic Normal speech. Oriented to person, place, and time. Epicritic sensation to light touch grossly present bilaterally.  Dermatologic Skin healing well without signs of infection. Skin edges well coapted without signs of  infection.  Orthopedic: Tenderness to palpation noted about the surgical site.   Radiographs: Interval resection of left second digit.  Assessment:   1. Post-operative state    Plan:  Patient was evaluated and treated and all questions answered.  S/p foot surgery left -Progressing as expected post-operatively. -WB Status: WBAT in surgical shoe -Sutures: intact. -Medications: n/a -Foot redressed. Return in 2 weeks for suture removal.   No follow-ups on file.

## 2022-11-12 ENCOUNTER — Encounter: Payer: Medicare HMO | Admitting: Podiatry

## 2022-11-15 ENCOUNTER — Other Ambulatory Visit (HOSPITAL_COMMUNITY): Payer: Self-pay | Admitting: Nephrology

## 2022-11-15 ENCOUNTER — Telehealth: Payer: Self-pay

## 2022-11-15 DIAGNOSIS — I779 Disorder of arteries and arterioles, unspecified: Secondary | ICD-10-CM | POA: Diagnosis not present

## 2022-11-15 DIAGNOSIS — D638 Anemia in other chronic diseases classified elsewhere: Secondary | ICD-10-CM | POA: Diagnosis not present

## 2022-11-15 DIAGNOSIS — E1122 Type 2 diabetes mellitus with diabetic chronic kidney disease: Secondary | ICD-10-CM

## 2022-11-15 DIAGNOSIS — I10 Essential (primary) hypertension: Secondary | ICD-10-CM

## 2022-11-15 DIAGNOSIS — N189 Chronic kidney disease, unspecified: Secondary | ICD-10-CM | POA: Diagnosis not present

## 2022-11-15 DIAGNOSIS — I480 Paroxysmal atrial fibrillation: Secondary | ICD-10-CM | POA: Diagnosis not present

## 2022-11-15 DIAGNOSIS — I129 Hypertensive chronic kidney disease with stage 1 through stage 4 chronic kidney disease, or unspecified chronic kidney disease: Secondary | ICD-10-CM | POA: Diagnosis not present

## 2022-11-15 DIAGNOSIS — I350 Nonrheumatic aortic (valve) stenosis: Secondary | ICD-10-CM | POA: Diagnosis not present

## 2022-11-15 DIAGNOSIS — F1410 Cocaine abuse, uncomplicated: Secondary | ICD-10-CM | POA: Diagnosis not present

## 2022-11-15 NOTE — Progress Notes (Signed)
  Chronic Care Management Note  11/15/2022 Name: [name] MRN: [id number] DOB: [birth year]  Todd Zhang is a 77 y.o. year old male who is a primary care patient of [name], [place] and is actively engaged with the Chronic Care Management team. I reached out to [name] by phone today to assist with re-scheduling an initial visit with the RN Case Manager  Follow up plan: Unsuccessful telephone outreach attempt made. A HIPAA compliant phone message was left for the patient providing contact information and requesting a return call.  The care management team will reach out to the patient again over the next 7 days.  If patient returns call to provider office, please advise to call [facility]  at [place], [place], [place] [address] Direct Dial: [phone] [email or web address]

## 2022-11-16 ENCOUNTER — Other Ambulatory Visit: Payer: Self-pay

## 2022-11-16 ENCOUNTER — Ambulatory Visit: Payer: Medicare HMO | Admitting: Podiatry

## 2022-11-16 DIAGNOSIS — I739 Peripheral vascular disease, unspecified: Secondary | ICD-10-CM

## 2022-11-17 ENCOUNTER — Other Ambulatory Visit: Payer: Self-pay | Admitting: Nurse Practitioner

## 2022-11-23 ENCOUNTER — Encounter: Payer: Self-pay | Admitting: Podiatry

## 2022-11-23 ENCOUNTER — Ambulatory Visit (INDEPENDENT_AMBULATORY_CARE_PROVIDER_SITE_OTHER): Payer: Medicare HMO | Admitting: Podiatry

## 2022-11-23 DIAGNOSIS — Z9889 Other specified postprocedural states: Secondary | ICD-10-CM

## 2022-11-23 DIAGNOSIS — Z794 Long term (current) use of insulin: Secondary | ICD-10-CM

## 2022-11-23 DIAGNOSIS — E1140 Type 2 diabetes mellitus with diabetic neuropathy, unspecified: Secondary | ICD-10-CM

## 2022-11-23 NOTE — Progress Notes (Signed)
Subjective:  Patient ID: Todd Zhang, male    DOB: Nov 06, 1945,  MRN: [id number]  Chief Complaint  Patient presents with   Routine Post Op    POV #2 Left second digit amputation    DOS: 10/26/22 Procedure: Left second digit amputation  77 y.o. male returns for POV#2. Relates doing well with minimal pain.  Has kept dressing clean.   Review of Systems: Negative except as noted in the HPI. Denies N/V/F/Ch.  Past Medical History:  Diagnosis Date   Atrial fibrillation ([place])    Cervical spinal stenosis    DDD (degenerative disc disease), cervical    DDD (degenerative disc disease), lumbar    Diabetes mellitus without complication (HCC)    Dyspnea    Hyperlipidemia    Hypertension    Peripheral artery disease (HCC)     Current Outpatient Medications:    alendronate (FOSAMAX) 70 MG tablet, TAKE 1 TABLET BY MOUTH ONCE WEEKLY ON SUNDAY ON ON AN EMPTY STOMACH with full glass of water (Patient taking differently: Take 70 mg by mouth once a week.), Disp: 12 tablet, Rfl: 0   atorvastatin (LIPITOR) 40 MG tablet, Take 1 tablet (40 mg total) by mouth daily., Disp: 90 tablet, Rfl: 1   clopidogrel (PLAVIX) 75 MG tablet, Take 1 tablet (75 mg total) by mouth daily., Disp: 90 tablet, Rfl: 1   ezetimibe (ZETIA) 10 MG tablet, Take 1 tablet (10 mg total) by mouth daily., Disp: 90 tablet, Rfl: 3   fenofibrate (TRICOR) 145 MG tablet, Take 1 tablet (145 mg total) by mouth daily., Disp: 90 tablet, Rfl: 1   furosemide (LASIX) 40 MG tablet, Take 1 tablet (40 mg total) by mouth 2 (two) times daily., Disp: 60 tablet, Rfl: 3   gabapentin (NEURONTIN) 300 MG capsule, Take 1 capsule (300 mg total) by mouth 4 (four) times daily. TAKE 1 CAPSULE BY MOUTH THREE TIMES DAILY FOR PAIN, Disp: 120 capsule, Rfl: 5   glucose blood (TRUE METRIX BLOOD GLUCOSE TEST) test strip, CHECK BLOOD SUGAR FOUR TIMES DAILY Dx E11.40, Disp: 400 each, Rfl: 3   insulin regular (HUMULIN R) 100 units/mL injection, INJECT 10 UNITS INTO THE  SKIN THREE TIMES DAILY BEFORE MEALS (Patient taking differently: Inject 10 Units into the skin 3 (three) times daily before meals. Per sliding scale), Disp: 30 mL, Rfl: 1   Insulin Syringe-Needle U-100 (GLOBAL INJECT EASE INSULIN SYR) 31G X 5/16" 0.3 ML MISC, USE WITH INSULIN EVERY DAY Dx E11.40, Disp: 100 each, Rfl: 3   Lancets (UNILET MICRO-THIN 33G) MISC, CHECK BLOOD SUGAR FOUR TIMES DAILY Dx E11.40, Disp: 400 each, Rfl: 3   metFORMIN (GLUCOPHAGE) 500 MG tablet, Take 1 tablet (500 mg total) by mouth 2 (two) times daily., Disp: 180 tablet, Rfl: 0   metoprolol tartrate (LOPRESSOR) 25 MG tablet, Take 1 tablet (25 mg total) by mouth 2 (two) times daily., Disp: 180 tablet, Rfl: 3   nitroGLYCERIN (NITROSTAT) 0.4 MG SL tablet, DISSOLVE 1 TABLET UNDER THE TONGUE EVERY 5 MINUTES AS NEEDED FOR CHEST PAIN. DO NOT EXCEED A TOTAL OF 3 DOSES IN 15 MINUTES. (Patient taking differently: Place 0.4 mg under the tongue every 5 (five) minutes as needed for chest pain.), Disp: 25 tablet, Rfl: 2   pantoprazole (PROTONIX) 40 MG tablet, Take 1 tablet (40 mg total) by mouth daily., Disp: 90 tablet, Rfl: 1   tamsulosin (FLOMAX) 0.4 MG CAPS capsule, Take 1 capsule (0.4 mg total) by mouth daily., Disp: 90 capsule, Rfl: 1  Social History  Tobacco Use  Smoking Status Former   Packs/day: 1.00   Types: Cigarettes   Start date: 06/19/1966   Quit date: 06/20/2011   Years since quitting: 11.4   Passive exposure: Past  Smokeless Tobacco Never    Allergies  Allergen Reactions   Asa [Aspirin] Hives   Penicillins Hives    Hives + vomiting as an adult  - tolerated Ceftriaxone 04/2021   Tetanus Toxoid     unknown   Objective:  There were no vitals filed for this visit. There is no height or weight on file to calculate BMI. Constitutional Well developed. Well nourished.  Vascular Foot warm and well perfused. Capillary refill normal to all digits.   Neurologic Normal speech. Oriented to person, place, and  time. Epicritic sensation to light touch grossly present bilaterally.  Dermatologic Skin healing well without signs of infection. Skin edges well coapted without signs of infection.  Orthopedic: Tenderness to palpation noted about the surgical site.   Radiographs: Interval resection of left second digit.  Assessment:   1. Post-operative state   2. Type 2 diabetes mellitus with diabetic neuropathy, with long-term current use of insulin ([place])     Plan:  Patient was evaluated and treated and all questions answered.  S/p foot surgery left -Progressing as expected post-operatively. -WB Status: WBAT in surgical shoe and may begin transition to regular shoe.  -Sutures: removed today without incident.  -Medications: n/a -Foot redressed. Return in 3 weeks for check.   Return in about 3 weeks (around 12/14/2022) for post op.

## 2022-11-25 ENCOUNTER — Ambulatory Visit: Payer: Medicare HMO | Admitting: Podiatry

## 2022-11-25 ENCOUNTER — Ambulatory Visit (INDEPENDENT_AMBULATORY_CARE_PROVIDER_SITE_OTHER): Payer: Medicare HMO | Admitting: *Deleted

## 2022-11-25 DIAGNOSIS — I10 Essential (primary) hypertension: Secondary | ICD-10-CM

## 2022-11-25 DIAGNOSIS — E1140 Type 2 diabetes mellitus with diabetic neuropathy, unspecified: Secondary | ICD-10-CM

## 2022-11-25 NOTE — Chronic Care Management (AMB) (Signed)
   11/25/2022  Todd Zhang 1946-09-08 [id number]   Care plan updated and completed as patient did not reschedule follow up outreach stating he has new primary care provider. In basket message sent to new primary care provider [name] NP at [facility] (will be seeing patient on 11/29/22) with request for chronic care management if feels appropriate.  Jacqlyn Larsen RNC, BSN RN Case Manager [place] [phone]

## 2022-11-25 NOTE — Progress Notes (Signed)
  Chronic Care Management Note  11/25/2022 Name: Todd Zhang MRN: [id number] DOB: 1946/03/12  Todd Zhang is a 77 y.o. year old male who is a primary care patient of Todd Zhang, Todd Zhang and is actively engaged with the Chronic Care Management team. I reached out to Todd Zhang by phone today to assist with re-scheduling a follow up visit with the Todd Zhang  Follow up plan: Patient has changed PCP  Todd Zhang, [name] [facility]  [name], [name] [address] Direct Dial: [phone] [email or web address]

## 2022-11-29 ENCOUNTER — Ambulatory Visit (INDEPENDENT_AMBULATORY_CARE_PROVIDER_SITE_OTHER): Payer: Medicare HMO | Admitting: Family Medicine

## 2022-11-29 ENCOUNTER — Encounter: Payer: Self-pay | Admitting: Family Medicine

## 2022-11-29 VITALS — BP 107/69 | HR 88 | Ht 72.0 in | Wt 213.0 lb

## 2022-11-29 DIAGNOSIS — Z794 Long term (current) use of insulin: Secondary | ICD-10-CM

## 2022-11-29 DIAGNOSIS — E7849 Other hyperlipidemia: Secondary | ICD-10-CM

## 2022-11-29 DIAGNOSIS — E038 Other specified hypothyroidism: Secondary | ICD-10-CM | POA: Diagnosis not present

## 2022-11-29 DIAGNOSIS — Z1159 Encounter for screening for other viral diseases: Secondary | ICD-10-CM | POA: Diagnosis not present

## 2022-11-29 DIAGNOSIS — E1140 Type 2 diabetes mellitus with diabetic neuropathy, unspecified: Secondary | ICD-10-CM

## 2022-11-29 DIAGNOSIS — I10 Essential (primary) hypertension: Secondary | ICD-10-CM | POA: Diagnosis not present

## 2022-11-29 NOTE — Assessment & Plan Note (Signed)
He takes atorvastatin 40 mg daily, ezetimibe 10 mg daily, and fenofibrate 145 mg daily He denies muscle aches and pain Will assess lipid panel today Lab Results  Component Value Date   CHOL 218 (H) 08/03/2022   HDL 44 08/03/2022   LDLCALC 137 (H) 08/03/2022   TRIG 204 (H) 08/03/2022   CHOLHDL 5.0 08/03/2022

## 2022-11-29 NOTE — Assessment & Plan Note (Addendum)
Controlled Denies headaches, dizziness, blurred vision, chest pain, palpitation, shortness of breath He takes metoprolol 25 mg twice daily, and furosemide 40 mg twice daily Encouraged to continue treatment regimen Encouraged low-sodium diet with increased physical activity BP Readings from Last 3 Encounters:  11/29/22 107/69  10/28/22 (!) 145/85  10/04/22 102/60

## 2022-11-29 NOTE — Progress Notes (Signed)
New Patient Office Visit  Subjective:  Patient ID: Todd Zhang, male    DOB: Jul 14, 1946  Age: 77 y.o. MRN: [id number]  CC:  Chief Complaint  Patient presents with   Establish Care    HPI Todd Zhang is a 77 y.o. male with past medical history of peripheral artery disease, Aortic valve stenosis, COPD, BPH, Type 2 diabetes and CKD presents for establishing care. For the details of today's visit, please refer to the assessment and plan.     Past Medical History:  Diagnosis Date   Atrial fibrillation ([place])    Cervical spinal stenosis    DDD (degenerative disc disease), cervical    DDD (degenerative disc disease), lumbar    Diabetes mellitus without complication ([place])    Dyspnea    Hyperlipidemia    Hypertension    Peripheral artery disease ([place])     Past Surgical History:  Procedure Laterality Date   ABDOMINAL AORTOGRAM W/LOWER EXTREMITY N/A 04/30/2021   Procedure: ABDOMINAL AORTOGRAM W/LOWER EXTREMITY;  Surgeon: Cherre Robins, MD;  Location: [facility];  Service: Cardiovascular;  Laterality: N/A;   ABDOMINAL AORTOGRAM W/LOWER EXTREMITY N/A 10/25/2022   Procedure: ABDOMINAL AORTOGRAM W/LOWER EXTREMITY;  Surgeon: Marty Heck, MD;  Location: [facility];  Service: Cardiovascular;  Laterality: N/A;   AMPUTATION TOE     AMPUTATION TOE Left 05/04/2021   Procedure: LEFT GREAT TOE AMPUTATION;  Surgeon: Marty Heck, MD;  Location: [facility];  Service: Vascular;  Laterality: Left;   AMPUTATION TOE Left 10/26/2022   Procedure: AMPUTATION LEFT SECOND TOE;  Surgeon: Lorenda Peck, DPM;  Location: [facility];  Service: Podiatry;  Laterality: Left;   BYPASS GRAFT FEMORAL-PERONEAL Left 05/04/2021   Procedure: LEFT COMMON FEMORAL-TIBIOPERONEAL TRUNK BYPASS;  Surgeon: Marty Heck, MD;  Location: [facility];  Service: Vascular;  Laterality: Left;   ENDARTERECTOMY FEMORAL Left 05/04/2021   Procedure: LEFT COMMON FEMORAL ENDARTERECTOMY WITH BOVINE PATCH;  Surgeon:  Marty Heck, MD;  Location: [facility];  Service: Vascular;  Laterality: Left;   ENDARTERECTOMY TIBIOPERONEAL Left 05/04/2021   Procedure: LEFT BELOW KNEE POPITEAL WITH  TIBIOPERONEAL ENDARTERECTOMY;  Surgeon: Marty Heck, MD;  Location: [facility];  Service: Vascular;  Laterality: Left;   ENDOVEIN HARVEST OF GREATER SAPHENOUS VEIN  05/04/2021   Procedure: HARVEST OF LEFT GREATER SAPHENOUS VEIN;  Surgeon: Marty Heck, MD;  Location: [facility];  Service: Vascular;;   INSERTION OF ILIAC STENT Left 05/04/2021   Procedure: LEFT COMMON ILLIAC ATERY ANGIOPLASTY WITH STENT;  Surgeon: Marty Heck, MD;  Location: [facility];  Service: Vascular;  Laterality: Left;   INTRAOPERATIVE ARTERIOGRAM Left 05/04/2021   Procedure: LEFT ARTERY ARTERIOGRAM;  Surgeon: Marty Heck, MD;  Location: [facility];  Service: Vascular;  Laterality: Left;   IRRIGATION AND DEBRIDEMENT FOOT Left 05/04/2021   Procedure: IRRIGATION AND DEBRIDEMENT FOOT;  Surgeon: Marty Heck, MD;  Location: [facility];  Service: Vascular;  Laterality: Left;   KNEE SURGERY Left    NOSE SURGERY     PERIPHERAL VASCULAR INTERVENTION  10/25/2022   Procedure: PERIPHERAL VASCULAR INTERVENTION;  Surgeon: Marty Heck, MD;  Location: [facility];  Service: Cardiovascular;;   TEE WITHOUT CARDIOVERSION N/A 05/01/2021   Procedure: TRANSESOPHAGEAL ECHOCARDIOGRAM (TEE);  Surgeon: Skeet Latch, MD;  [facility];  Service: Cardiovascular;  Laterality: N/A;    Family History  Problem Relation Age of Onset   Stroke Mother    Heart disease Mother  CABG   Cancer Father     Social History   Socioeconomic History   Marital status: Divorced    Spouse name: Not on file   Number of children: 2   Years of education: 11   Highest education level: 11th grade  Occupational History   Occupation: Disabled  Tobacco Use   Smoking status: Former    Packs/day: 1    Types: Cigarettes    Start date: 06/19/1966     Quit date: 06/20/2011    Years since quitting: 11.4    Passive exposure: Past   Smokeless tobacco: Never  Vaping Use   Vaping Use: Never used  Substance and Sexual Activity   Alcohol use: No    Comment: quit drinking in 1982   Drug use: Not Currently    Comment: marijuana and cocaine in the past, quit in 2012   Sexual activity: Not Currently  Other Topics Concern   Not on file  Social History Narrative   Lives alone - ground level apartment   Social Determinants of Health   Financial Resource Strain: [place]  (09/21/2022)   Overall Financial Resource Strain (CARDIA)    Difficulty of Paying Living Expenses: Not very hard  Food Insecurity: No Food Insecurity (10/23/2022)   Hunger Vital Sign    Worried About Running Out of Food in the Last Year: Never true    [place] in the Last Year: Never true  Transportation Needs: No Transportation Needs (10/23/2022)   PRAPARE - Hydrologist (Medical): No    Lack of Transportation (Non-Medical): No  Physical Activity: Inactive (09/21/2022)   Exercise Vital Sign    Days of Exercise per Week: 0 days    Minutes of Exercise per Session: 0 min  Stress: Stress Concern Present (09/21/2022)   [place]    Feeling of Stress : To some extent  Social Connections: Moderately Integrated (09/21/2022)   Social Connection and Isolation Panel [NHANES]    Frequency of Communication with Friends and Family: More than three times a week    Frequency of Social Gatherings with Friends and Family: More than three times a week    Attends Religious Services: More than 4 times per year    Active Member of [employer] or Organizations: Yes    Attends Music therapist: More than 4 times per year    Marital Status: Divorced  Intimate Partner Violence: Not At Risk (10/23/2022)   Humiliation, Afraid, Rape, and Kick questionnaire    Fear of Current or Ex-Partner: No     Emotionally Abused: No    Physically Abused: No    Sexually Abused: No    ROS Review of Systems  Constitutional:  Negative for fatigue and fever.  Eyes:  Negative for visual disturbance.  Respiratory:  Negative for chest tightness and shortness of breath.   Cardiovascular:  Negative for chest pain and palpitations.  Neurological:  Negative for dizziness and headaches.    Objective:   Today's Vitals: BP 107/69   Pulse 88   Ht 6' (1.829 m)   Wt 213 lb (96.6 kg)   SpO2 94%   BMI 28.89 kg/m   Physical Exam HENT:     Head: Normocephalic.     Right Ear: External ear normal.     Left Ear: External ear normal.     Nose: No congestion or rhinorrhea.     Mouth/Throat:  Mouth: Mucous membranes are moist.  Cardiovascular:     Rate and Rhythm: Regular rhythm.     Heart sounds: No murmur heard. Pulmonary:     Effort: No respiratory distress.     Breath sounds: Normal breath sounds.  Skin:    Comments: left amputated first, second and third toes 2 months ago   Neurological:     Mental Status: He is alert.      Assessment & Plan:   Essential hypertension Assessment & Plan: Controlled Denies headaches, dizziness, blurred vision, chest pain, palpitation, shortness of breath He takes metoprolol 25 mg twice daily, and furosemide 40 mg twice daily Encouraged to continue treatment regimen Encouraged low-sodium diet with increased physical activity BP Readings from Last 3 Encounters:  11/29/22 107/69  10/28/22 (!) 145/85  10/04/22 102/60     Orders: -     Microalbumin / creatinine urine ratio -     AMB Referral to Chronic Care Management Services  Type 2 diabetes mellitus with diabetic neuropathy, with long-term current use of insulin (HCC) Assessment & Plan: Denies polyuria, polyphagia, and polydipsia He takes Humulin insulin 10 units 3 times daily before meals and metformin 500 mg twice daily Will assess hemoglobin A1c today Referral placed to  endocrinology    Orders: -     CBC with Differential/Platelet -     CMP14+EGFR -     Hemoglobin A1c -     Microalbumin / creatinine urine ratio -     AMB Referral to Chronic Care Management Services -     Ambulatory referral to Endocrinology  Other hyperlipidemia Assessment & Plan: He takes atorvastatin 40 mg daily, ezetimibe 10 mg daily, and fenofibrate 145 mg daily He denies muscle aches and pain Will assess lipid panel today Lab Results  Component Value Date   CHOL 218 (H) 08/03/2022   HDL 44 08/03/2022   LDLCALC 137 (H) 08/03/2022   TRIG 204 (H) 08/03/2022   CHOLHDL 5.0 08/03/2022     Orders: -     Lipid panel  Need for hepatitis C screening test -     Hepatitis C antibody  Other specified hypothyroidism -     TSH + free T4     Follow-up: Return in about 3 months (around 03/01/2023).   [name], FNP

## 2022-11-29 NOTE — Patient Instructions (Signed)
I appreciate the opportunity to provide care to you today!    Follow up:  3 months  Labs: please stop by the lab during the week to get your blood drawn (CBC, CMP, TSH, Lipid profile, HgA1c, Vit D)  Screening:  Hep C                         Very Nice to [employer] you, Mr. Urman!   Please continue to a heart-healthy diet and increase your physical activities. Try to exercise for 66mins at least five days a week.      It was a pleasure to see you and I look forward to continuing to work together on your health and well-being. Please do not hesitate to call the office if you need care or have questions about your care.   Have a wonderful day and week. With Gratitude, [name] MSN, FNP-BC

## 2022-11-29 NOTE — Assessment & Plan Note (Addendum)
Denies polyuria, polyphagia, and polydipsia He takes Humulin insulin 10 units 3 times daily before meals and metformin 500 mg twice daily Will assess hemoglobin A1c today Referral placed to endocrinology

## 2022-11-30 DIAGNOSIS — Z794 Long term (current) use of insulin: Secondary | ICD-10-CM | POA: Diagnosis not present

## 2022-11-30 DIAGNOSIS — E038 Other specified hypothyroidism: Secondary | ICD-10-CM | POA: Diagnosis not present

## 2022-11-30 DIAGNOSIS — E1140 Type 2 diabetes mellitus with diabetic neuropathy, unspecified: Secondary | ICD-10-CM | POA: Diagnosis not present

## 2022-11-30 DIAGNOSIS — I10 Essential (primary) hypertension: Secondary | ICD-10-CM | POA: Diagnosis not present

## 2022-11-30 DIAGNOSIS — E7849 Other hyperlipidemia: Secondary | ICD-10-CM | POA: Diagnosis not present

## 2022-11-30 DIAGNOSIS — Z1159 Encounter for screening for other viral diseases: Secondary | ICD-10-CM | POA: Diagnosis not present

## 2022-12-01 ENCOUNTER — Telehealth: Payer: Self-pay

## 2022-12-01 NOTE — Progress Notes (Signed)
  Chronic Care Management   Note  12/01/2022 Name: Todd Zhang MRN: [id number] DOB: Jan 26, 1946  Todd Zhang is a 77 y.o. year old male who is a primary care patient of [name], [place]. I reached out to [name] by phone today in response to a referral sent by [name] PCP.  [name] was given information about Chronic Care Management services today including:  CCM service includes personalized support from designated clinical staff supervised by the physician, including individualized plan of care and coordination with other care providers 24/7 contact phone numbers for assistance for urgent and routine care needs. Service will only be billed when office clinical staff spend 20 minutes or more in a month to coordinate care. Only one practitioner may furnish and bill the service in a calendar month. The patient may stop CCM services at amy time (effective at the end of the month) by phone call to the office staff. The patient will be responsible for cost sharing (co-pay) or up to 20% of the service fee (after annual deductible is met)  Todd Zhang  agreedto scheduling an appointment with the CCM RN Case Manager   Follow up plan: Patient agreed to scheduled appointment with RN Case Manager on 12/24/2022(date/time).   Noreene Larsson, [place], [place] [address] Direct Dial: [phone] [email or web address]

## 2022-12-02 LAB — CBC WITH DIFFERENTIAL/PLATELET
Basophils Absolute: 0.1 10*3/uL (ref 0.0–0.2)
Basos: 1 %
EOS (ABSOLUTE): 0.2 10*3/uL (ref 0.0–0.4)
Eos: 2 %
Hematocrit: 41.9 % (ref 37.5–51.0)
Hemoglobin: 14.1 g/dL (ref 13.0–17.7)
Immature Grans (Abs): 0 10*3/uL (ref 0.0–0.1)
Immature Granulocytes: 1 %
Lymphocytes Absolute: 2.9 10*3/uL (ref 0.7–3.1)
Lymphs: 32 %
MCH: 30.7 pg (ref 26.6–33.0)
MCHC: 33.7 g/dL (ref 31.5–35.7)
MCV: 91 fL (ref 79–97)
Monocytes Absolute: 0.7 10*3/uL (ref 0.1–0.9)
Monocytes: 7 %
Neutrophils Absolute: 5.1 10*3/uL (ref 1.4–7.0)
Neutrophils: 57 %
Platelets: 265 10*3/uL (ref 150–450)
RBC: 4.59 x10E6/uL (ref 4.14–5.80)
RDW: 14.1 % (ref 11.6–15.4)
WBC: 8.9 10*3/uL (ref 3.4–10.8)

## 2022-12-02 LAB — CMP14+EGFR
ALT: 21 IU/L (ref 0–44)
AST: 21 IU/L (ref 0–40)
Albumin/Globulin Ratio: 1.6 (ref 1.2–2.2)
Albumin: 4.6 g/dL (ref 3.8–4.8)
Alkaline Phosphatase: 62 IU/L (ref 44–121)
BUN/Creatinine Ratio: 22 (ref 10–24)
BUN: 45 mg/dL — ABNORMAL HIGH (ref 8–27)
Bilirubin Total: 0.4 mg/dL (ref 0.0–1.2)
CO2: 25 mmol/L (ref 20–29)
Calcium: 9.9 mg/dL (ref 8.6–10.2)
Chloride: 94 mmol/L — ABNORMAL LOW (ref 96–106)
Creatinine, Ser: 2.05 mg/dL — ABNORMAL HIGH (ref 0.76–1.27)
Globulin, Total: 2.9 g/dL (ref 1.5–4.5)
Glucose: 206 mg/dL — ABNORMAL HIGH (ref 70–99)
Potassium: 4.5 mmol/L (ref 3.5–5.2)
Sodium: 138 mmol/L (ref 134–144)
Total Protein: 7.5 g/dL (ref 6.0–8.5)
eGFR: 33 mL/min/{1.73_m2} — ABNORMAL LOW (ref >59)

## 2022-12-02 LAB — LIPID PANEL
Chol/HDL Ratio: 4.6 ratio (ref 0.0–5.0)
Cholesterol, Total: 229 mg/dL — ABNORMAL HIGH (ref 100–199)
HDL: 50 mg/dL (ref >39)
LDL Chol Calc (NIH): 132 mg/dL — ABNORMAL HIGH (ref 0–99)
Triglycerides: 267 mg/dL — ABNORMAL HIGH (ref 0–149)
VLDL Cholesterol Cal: 47 mg/dL — ABNORMAL HIGH (ref 5–40)

## 2022-12-02 LAB — MICROALBUMIN / CREATININE URINE RATIO
Creatinine, Urine: 153.7 mg/dL
Microalb/Creat Ratio: 23 mg/g creat (ref 0–29)
Microalbumin, Urine: 34.6 ug/mL

## 2022-12-02 LAB — HEPATITIS C ANTIBODY: Hep C Virus Ab: NONREACTIVE

## 2022-12-02 LAB — TSH+FREE T4
Free T4: 1.15 ng/dL (ref 0.82–1.77)
TSH: 2.91 u[IU]/mL (ref 0.450–4.500)

## 2022-12-02 LAB — HEMOGLOBIN A1C
Est. average glucose Bld gHb Est-mCnc: 200 mg/dL
Hgb A1c MFr Bld: 8.6 % — ABNORMAL HIGH (ref 4.8–5.6)

## 2022-12-03 ENCOUNTER — Other Ambulatory Visit: Payer: Self-pay | Admitting: Family Medicine

## 2022-12-03 DIAGNOSIS — E785 Hyperlipidemia, unspecified: Secondary | ICD-10-CM

## 2022-12-03 MED ORDER — ATORVASTATIN CALCIUM 80 MG PO TABS: 80.0000 mg | ORAL_TABLET | Freq: Every day | ORAL | 3 refills | Status: DC

## 2022-12-03 NOTE — Progress Notes (Signed)
Please encourage the patient to continue to follow up with his nephrologist and to increase his fluid consumption to at least 64 ounces daily. I have increased his atorvastatin to 80 mg daily and recommend that he continue taking acetamide 10 mg daily. His LDL cholesterol is not at goal; I want his LDL to be less than 70 to decrease his cardiovascular risk. His hemoglobin A1c has improved from 8.8 to 8.6. Please continue on a current treatment regimen. A referral was placed to endocrinology; please encourage the patient to follow up.

## 2022-12-07 ENCOUNTER — Ambulatory Visit (INDEPENDENT_AMBULATORY_CARE_PROVIDER_SITE_OTHER): Payer: Medicare HMO | Admitting: Physician Assistant

## 2022-12-07 ENCOUNTER — Ambulatory Visit (INDEPENDENT_AMBULATORY_CARE_PROVIDER_SITE_OTHER)
Admission: RE | Admit: 2022-12-07 | Discharge: 2022-12-07 | Disposition: A | Discharge status: Home / Self Care | Payer: Medicare HMO | Source: Ambulatory Visit | Attending: Vascular Surgery | Admitting: Vascular Surgery

## 2022-12-07 ENCOUNTER — Ambulatory Visit (HOSPITAL_COMMUNITY)
Admission: RE | Admit: 2022-12-07 | Discharge: 2022-12-07 | Disposition: A | Discharge status: Home / Self Care | Payer: Medicare HMO | Source: Ambulatory Visit | Attending: Vascular Surgery | Admitting: Vascular Surgery

## 2022-12-07 VITALS — BP 138/86 | HR 66 | Temp 97.9°F | Resp 20 | Ht 72.0 in | Wt 217.5 lb

## 2022-12-07 DIAGNOSIS — I739 Peripheral vascular disease, unspecified: Secondary | ICD-10-CM

## 2022-12-07 LAB — VAS US ABI WITH/WO TBI
Left ABI: 0.99
Right ABI: 0.68

## 2022-12-07 NOTE — Progress Notes (Signed)
Office Note   History of Present Illness   Todd Zhang is a 77 y.o. ([birth year]) male who presents for PAD follow-up.  He recently underwent left external iliac artery angioplasty with stent placement on 10/25/2022 by Dr. Carlis Abbott.  This was done to improve blood flow so the patient could heal a left second toe amputation on 10/26/2022.  He has a history of left common iliac artery stent, left common femoral endarterectomy, left below-knee popliteal artery and TP trunk endarterectomy, left common femoral to TP trunk bypass with GSV, and left great toe ray amputation on 05/04/2021 by Dr. Carlis Abbott.  He returns today for follow-up.  He has been doing well since his procedure and his left second toe amputation has nearly healed.  Podiatry has removed the sutures from his incision site.  He is still ambulating with his postop shoe.  He denies any claudication or rest pain.  He does still have pain in both of his heels daily, which she describes as a stabbing and burning pain.  This pain is present at times when he is sitting or standing, and it is worse at night.  He also has occasional numbness of his heels.  He has been previously diagnosed with diabetic neuropathy, and he used to take gabapentin for this however he stopped it due to "fear of developing Alzheimer's from it".  He states that his heel pain is not relieved by dangling his feet off of the bed.  He typically waits for the pain to go away on its own.   Current Outpatient Medications  Medication Sig Dispense Refill   alendronate (FOSAMAX) 70 MG tablet TAKE 1 TABLET BY MOUTH ONCE WEEKLY ON SUNDAY ON ON AN EMPTY STOMACH with full glass of water 12 tablet 0   atorvastatin (LIPITOR) 80 MG tablet Take 1 tablet (80 mg total) by mouth daily. 90 tablet 3   Blood Glucose Monitoring Suppl (GNP TRUE METRIX GLUCOSE METER) w/Device KIT      clopidogrel (PLAVIX) 75 MG tablet Take 1 tablet (75 mg total) by mouth daily. 90 tablet 1   ezetimibe (ZETIA) 10 MG  tablet Take 1 tablet (10 mg total) by mouth daily. 90 tablet 3   fenofibrate (TRICOR) 145 MG tablet Take 1 tablet (145 mg total) by mouth daily. 90 tablet 1   furosemide (LASIX) 40 MG tablet Take 1 tablet (40 mg total) by mouth 2 (two) times daily. 60 tablet 3   gabapentin (NEURONTIN) 300 MG capsule Take 1 capsule (300 mg total) by mouth 4 (four) times daily. TAKE 1 CAPSULE BY MOUTH THREE TIMES DAILY FOR PAIN 120 capsule 5   glucose blood (TRUE METRIX BLOOD GLUCOSE TEST) test strip CHECK BLOOD SUGAR FOUR TIMES DAILY Dx E11.40 400 each 3   insulin regular (HUMULIN R) 100 units/mL injection INJECT 10 UNITS INTO THE SKIN THREE TIMES DAILY BEFORE MEALS 30 mL 1   Insulin Syringe-Needle U-100 (GLOBAL INJECT EASE INSULIN SYR) 31G X 5/16" 0.3 ML MISC USE WITH INSULIN EVERY DAY Dx E11.40 100 each 3   Lancets (UNILET MICRO-THIN 33G) MISC CHECK BLOOD SUGAR FOUR TIMES DAILY Dx E11.40 400 each 3   metFORMIN (GLUCOPHAGE) 500 MG tablet Take 1 tablet (500 mg total) by mouth 2 (two) times daily. 180 tablet 0   metoprolol tartrate (LOPRESSOR) 25 MG tablet Take 1 tablet (25 mg total) by mouth 2 (two) times daily. 180 tablet 3   nitroGLYCERIN (NITROSTAT) 0.4 MG SL tablet DISSOLVE 1 TABLET UNDER THE TONGUE  EVERY 5 MINUTES AS NEEDED FOR CHEST PAIN. DO NOT EXCEED A TOTAL OF 3 DOSES IN 15 MINUTES. (Patient taking differently: Place 0.4 mg under the tongue every 5 (five) minutes as needed for chest pain.) 25 tablet 2   pantoprazole (PROTONIX) 40 MG tablet Take 1 tablet (40 mg total) by mouth daily. 90 tablet 1   tamsulosin (FLOMAX) 0.4 MG CAPS capsule Take 1 capsule (0.4 mg total) by mouth daily. 90 capsule 1   No current facility-administered medications for this visit.    REVIEW OF SYSTEMS (negative unless checked):   Cardiac:  []  Chest pain or chest pressure? []  Shortness of breath upon activity? []  Shortness of breath when lying flat? []  Irregular heart rhythm?  Vascular:  []  Pain in calf, thigh, or hip brought  on by walking? []  Pain in feet at night that wakes you up from your sleep? []  Blood clot in your veins? []  Leg swelling?  Pulmonary:  []  Oxygen at home? []  Productive cough? []  Wheezing?  Neurologic:  []  Sudden weakness in arms or legs? []  Sudden numbness in arms or legs? []  Sudden onset of difficult speaking or slurred speech? []  Temporary loss of vision in one eye? []  Problems with dizziness?  Gastrointestinal:  []  Blood in stool? []  Vomited blood?  Genitourinary:  []  Burning when urinating? []  Blood in urine?  Psychiatric:  []  Major depression  Hematologic:  []  Bleeding problems? []  Problems with blood clotting?  Dermatologic:  []  Rashes or ulcers?  Constitutional:  []  Fever or chills?  Ear/Nose/Throat:  []  Change in hearing? []  Nose bleeds? []  Sore throat?  Musculoskeletal:  []  Back pain? []  Joint pain? []  Muscle pain?   Physical Examination   Vitals:   12/07/22 1016  BP: 138/86  Pulse: 66  Resp: 20  Temp: 97.9 F (36.6 C)  SpO2: 100%  Weight: 217 lb 8 oz (98.7 kg)  Height: 6' (1.829 m)   Body mass index is 29.5 kg/m.  General:  WDWN in NAD; vital signs documented above Gait: Not observed HENT: WNL, normocephalic Pulmonary: normal non-labored breathing  Cardiac: regular Abdomen: soft, NT, no masses Skin: without rashes Vascular Exam/Pulses: Biphasic left PT/Peroneal doppler signals Extremities: without ischemic changes. Appropriately healing left 2nd toe amputation site Musculoskeletal: no muscle wasting or atrophy  Neurologic: A&O X 3;  No focal weakness or paresthesias are detected Psychiatric:  The pt has Normal affect.  Non-Invasive Vascular Imaging ABI (12/07/2022) R:  ABI: 0.68 (0.52),  PT: bi DP: mono TBI:  0.45 L:  ABI: 0.99 (0.65),  PT: bi DP: none TBI: none  Aortoiliac Duplex (12/07/2022) Patent left external iliac artery stent with 1-49% proximal stent stenosis. Borderline 50% stenosis of the left external iliac  artery proximal to the stent  Bypass Duplex (12/07/2022) Patent left common femoral artery to TP trunk bypass without stenosis.   Medical Decision Making   Todd Zhang is a 77 y.o. male who presents for PAD follow up   Based on the patient's vascular studies, his ABIs have greatly improved on the left since external iliac artery stenting.  His left ABI was 0.65 prior to his procedure, and now it is 0.99.  He has biphasic flow in his left PT and peroneal arteries He denies any rest pain or new nonhealing wounds.  He also denies any claudication.  His left second toe amputation is healing appropriately. He is still dealing with bilateral, stabbing pains in his heels likely due to his diabetic neuropathy.  He  states that he stopped taking his gabapentin and tries to deal with the pain on his own.  He also endorses some numbness of his heels Aortoiliac duplex demonstrates a patent left external iliac artery stent with 1 to 49% stenosis.  Duplex of the left common femoral artery to TP trunk bypass demonstrates a patent bypass without stenosis He can follow-up with our office in 6 months with repeat ABIs, left aortoiliac duplex, and left lower extremity bypass graft duplex study   Vicente Serene PA-C [facility]: [phone]  Call MD: Trula Slade

## 2022-12-12 DIAGNOSIS — E1140 Type 2 diabetes mellitus with diabetic neuropathy, unspecified: Secondary | ICD-10-CM

## 2022-12-12 DIAGNOSIS — Z794 Long term (current) use of insulin: Secondary | ICD-10-CM

## 2022-12-12 DIAGNOSIS — I10 Essential (primary) hypertension: Secondary | ICD-10-CM

## 2022-12-14 ENCOUNTER — Encounter: Payer: Self-pay | Admitting: Podiatry

## 2022-12-14 ENCOUNTER — Ambulatory Visit (INDEPENDENT_AMBULATORY_CARE_PROVIDER_SITE_OTHER): Payer: Medicare HMO | Admitting: Podiatry

## 2022-12-14 ENCOUNTER — Other Ambulatory Visit: Payer: Self-pay

## 2022-12-14 DIAGNOSIS — Z9889 Other specified postprocedural states: Secondary | ICD-10-CM

## 2022-12-14 DIAGNOSIS — Z899 Acquired absence of limb, unspecified: Secondary | ICD-10-CM

## 2022-12-14 DIAGNOSIS — E1140 Type 2 diabetes mellitus with diabetic neuropathy, unspecified: Secondary | ICD-10-CM

## 2022-12-14 DIAGNOSIS — M2041 Other hammer toe(s) (acquired), right foot: Secondary | ICD-10-CM

## 2022-12-14 DIAGNOSIS — I739 Peripheral vascular disease, unspecified: Secondary | ICD-10-CM

## 2022-12-14 NOTE — Progress Notes (Signed)
Subjective:  Patient ID: Todd Zhang, male    DOB: August 09, 1946,  MRN: [id number]  No chief complaint on file.   DOS: 10/26/22 Procedure: Left second digit amputation  77 y.o. male returns for POV#3. Relates doing well with minimal pain.    Review of Systems: Negative except as noted in the HPI. Denies N/V/F/Ch.  Past Medical History:  Diagnosis Date   Atrial fibrillation ([place])    Cervical spinal stenosis    DDD (degenerative disc disease), cervical    DDD (degenerative disc disease), lumbar    Diabetes mellitus without complication (HCC)    Dyspnea    Hyperlipidemia    Hypertension    Peripheral artery disease (HCC)     Current Outpatient Medications:    alendronate (FOSAMAX) 70 MG tablet, TAKE 1 TABLET BY MOUTH ONCE WEEKLY ON SUNDAY ON ON AN EMPTY STOMACH with full glass of water, Disp: 12 tablet, Rfl: 0   atorvastatin (LIPITOR) 80 MG tablet, Take 1 tablet (80 mg total) by mouth daily., Disp: 90 tablet, Rfl: 3   Blood Glucose Monitoring Suppl (GNP TRUE METRIX GLUCOSE METER) w/Device KIT, , Disp: , Rfl:    clopidogrel (PLAVIX) 75 MG tablet, Take 1 tablet (75 mg total) by mouth daily., Disp: 90 tablet, Rfl: 1   ezetimibe (ZETIA) 10 MG tablet, Take 1 tablet (10 mg total) by mouth daily., Disp: 90 tablet, Rfl: 3   fenofibrate (TRICOR) 145 MG tablet, Take 1 tablet (145 mg total) by mouth daily., Disp: 90 tablet, Rfl: 1   furosemide (LASIX) 40 MG tablet, Take 1 tablet (40 mg total) by mouth 2 (two) times daily., Disp: 60 tablet, Rfl: 3   gabapentin (NEURONTIN) 300 MG capsule, Take 1 capsule (300 mg total) by mouth 4 (four) times daily. TAKE 1 CAPSULE BY MOUTH THREE TIMES DAILY FOR PAIN, Disp: 120 capsule, Rfl: 5   glucose blood (TRUE METRIX BLOOD GLUCOSE TEST) test strip, CHECK BLOOD SUGAR FOUR TIMES DAILY Dx E11.40, Disp: 400 each, Rfl: 3   insulin regular (HUMULIN R) 100 units/mL injection, INJECT 10 UNITS INTO THE SKIN THREE TIMES DAILY BEFORE MEALS, Disp: 30 mL, Rfl: 1   Insulin  Syringe-Needle U-100 (GLOBAL INJECT EASE INSULIN SYR) 31G X 5/16" 0.3 ML MISC, USE WITH INSULIN EVERY DAY Dx E11.40, Disp: 100 each, Rfl: 3   Lancets (UNILET MICRO-THIN 33G) MISC, CHECK BLOOD SUGAR FOUR TIMES DAILY Dx E11.40, Disp: 400 each, Rfl: 3   metFORMIN (GLUCOPHAGE) 500 MG tablet, Take 1 tablet (500 mg total) by mouth 2 (two) times daily., Disp: 180 tablet, Rfl: 0   metoprolol tartrate (LOPRESSOR) 25 MG tablet, Take 1 tablet (25 mg total) by mouth 2 (two) times daily., Disp: 180 tablet, Rfl: 3   nitroGLYCERIN (NITROSTAT) 0.4 MG SL tablet, DISSOLVE 1 TABLET UNDER THE TONGUE EVERY 5 MINUTES AS NEEDED FOR CHEST PAIN. DO NOT EXCEED A TOTAL OF 3 DOSES IN 15 MINUTES. (Patient taking differently: Place 0.4 mg under the tongue every 5 (five) minutes as needed for chest pain.), Disp: 25 tablet, Rfl: 2   pantoprazole (PROTONIX) 40 MG tablet, Take 1 tablet (40 mg total) by mouth daily., Disp: 90 tablet, Rfl: 1   tamsulosin (FLOMAX) 0.4 MG CAPS capsule, Take 1 capsule (0.4 mg total) by mouth daily., Disp: 90 capsule, Rfl: 1  Social History   Tobacco Use  Smoking Status Former   Packs/day: 1   Types: Cigarettes   Start date: 06/19/1966   Quit date: 06/20/2011   Years since quitting: 27.4  Passive exposure: Past  Smokeless Tobacco Never    Allergies  Allergen Reactions   Asa [Aspirin] Hives   Penicillins Hives    Hives + vomiting as an adult  - tolerated Ceftriaxone 04/2021   Tetanus Toxoid     unknown   Objective:  There were no vitals filed for this visit. There is no height or weight on file to calculate BMI. Constitutional Well developed. Well nourished.  Vascular Foot warm and well perfused. Capillary refill normal to all digits.   Neurologic Normal speech. Oriented to person, place, and time. Epicritic sensation to light touch grossly present bilaterally.  Dermatologic Skin healing well without signs of infection. Skin edges well coapted without signs of infection.  Orthopedic:  Tenderness to palpation noted about the surgical site.   Radiographs: Interval resection of left second digit.  Assessment:   1. Post-operative state   2. Type 2 diabetes mellitus with diabetic neuropathy, with long-term current use of insulin     Plan:  Patient was evaluated and treated and all questions answered.  S/p foot surgery left -Progressing as expected post-operatively. -WB Status: WBAT in regular shoe.  -Medications: n/a -DM shoe fitting Return in 6 weeks for check.   No follow-ups on file.

## 2022-12-24 ENCOUNTER — Telehealth: Payer: Self-pay | Admitting: *Deleted

## 2022-12-24 ENCOUNTER — Ambulatory Visit (INDEPENDENT_AMBULATORY_CARE_PROVIDER_SITE_OTHER): Payer: Medicare HMO | Admitting: *Deleted

## 2022-12-24 DIAGNOSIS — I10 Essential (primary) hypertension: Secondary | ICD-10-CM

## 2022-12-24 DIAGNOSIS — E1140 Type 2 diabetes mellitus with diabetic neuropathy, unspecified: Secondary | ICD-10-CM

## 2022-12-24 NOTE — Plan of Care (Signed)
Chronic Care Management Provider Comprehensive Care Plan    12/24/2022 Name: Todd Zhang MRN: [id number] DOB: 07-23-1946  Referral to Chronic Care Management (CCM) services was placed by Provider:  [name] FNP on Date: 11/29/22.  Chronic Condition 1: DIABETES Provider Assessment and Plan Type 2 diabetes mellitus with diabetic neuropathy, with long-term current use of insulin (HCC) Assessment & Plan: Denies polyuria, polyphagia, and polydipsia He takes Humulin insulin 10 units 3 times daily before meals and metformin 500 mg twice daily Will assess hemoglobin A1c today Referral placed to endocrinology  Orders: -     CBC with Differential/Platelet -     CMP14+EGFR -     Hemoglobin A1c -     Microalbumin / creatinine urine ratio -     AMB Referral to Chronic Care Management Services -     Ambulatory referral to Endocrinology   Expected Outcome/Goals Addressed This Visit (Provider CCM goals/Provider Assessment and plan  CCM (DIABETES) EXPECTED OUTCOME: MONITOR, SELF-MANAGE AND REDUCE SYMPTOMS OF DIABETES  Symptom Management Condition 1: Take medications as prescribed   Attend all scheduled provider appointments Call pharmacy for medication refills 3-7 days in advance of running out of medications Attend church or other social activities Perform all self care activities independently  Perform IADL's (shopping, preparing meals, housekeeping, managing finances) independently Call provider office for new concerns or questions  check blood sugar at prescribed times: per doctor's order  check feet daily for cuts, sores or redness enter blood sugar readings and medication or insulin into daily log take the blood sugar log to all doctor visits take the blood sugar meter to all doctor visits set goal weight trim toenails straight across fill half of plate with vegetables limit fast food meals to no more than 1 per week prepare main meal at home 3 to 5 days each week read food  labels for fat, fiber, carbohydrates and portion size wash and dry feet carefully every day Look over education mailed- hypoglycemia fall prevention strategies: change position slowly, use assistive device such as walker or cane (per provider recommendations) when walking, keep walkways clear, have good lighting in room. It is important to contact your provider if you have any falls, maintain muscle strength/tone by exercise per provider recommendations.  Chronic Condition 2: HYPERTENSION Provider Assessment and Plan  Essential hypertension Assessment & Plan: Controlled Denies headaches, dizziness, blurred vision, chest pain, palpitation, shortness of breath He takes metoprolol 25 mg twice daily, and furosemide 40 mg twice daily Encouraged to continue treatment regimen Encouraged low-sodium diet with increased physical activity    BP Readings from Last 3 Encounters:  11/29/22 107/69  10/28/22 (!) 145/85  10/04/22 102/60 Orders: -     Microalbumin / creatinine urine ratio -     AMB Referral to Chronic Care Management Services    Expected Outcome/Goals Addressed This Visit (Provider CCM goals/Provider Assessment and plan  CCM (HYPERTENSION) EXPECTED OUTCOME: MONITOR, SELF-MANAGE AND REDUCE SYMPTOMS OF HYPERTENSION  Symptom Management Condition 2: Take medications as prescribed   Attend all scheduled provider appointments Call pharmacy for medication refills 3-7 days in advance of running out of medications Attend church or other social activities Perform all self care activities independently  Perform IADL's (shopping, preparing meals, housekeeping, managing finances) independently Call provider office for new concerns or questions  check blood pressure 3 times per week choose a place to take my blood pressure (home, clinic or office, retail store) write blood pressure results in a log or diary learn  about high blood pressure keep a blood pressure log take blood pressure log to  all doctor appointments call doctor for signs and symptoms of high blood pressure take medications for blood pressure exactly as prescribed begin an exercise program report new symptoms to your doctor eat more whole grains, fruits and vegetables, lean meats and healthy fats Look over education mailed- low sodium diet  Problem List Patient Active Problem List   Diagnosis Date Noted   Infection of toe 10/04/2022   Aortic valve stenosis 10/04/2022   BMI 29.0-29.9,adult 08/03/2022   HLD (hyperlipidemia) 07/28/2022   Osteomyelitis 06/19/2021   History of sepsis 06/02/2021   Anemia 06/02/2021   Acquired absence of other left toe(s) 05/08/2021   Afib 04/24/2021   CKD (chronic kidney disease) stage 3, GFR 30-59 ml/min 04/24/2021   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 09/13/2020   Peripheral artery disease    COPD exacerbation 05/14/2019   DDD (degenerative disc disease), cervical 11/06/2018   Generalized anxiety disorder 11/29/2017   Benign prostatic hyperplasia with incomplete bladder emptying 11/28/2017   Gastroesophageal reflux disease without esophagitis 09/07/2017   Essential hypertension 09/01/2017   Onychomycosis 09/01/2017   History of cardioversion 08/28/2015   Type 2 diabetes mellitus with diabetic neuropathy, with long-term current use of insulin 06/19/2014    Medication Management  Current Outpatient Medications:    alendronate (FOSAMAX) 70 MG tablet, TAKE 1 TABLET BY MOUTH ONCE WEEKLY ON SUNDAY ON ON AN EMPTY STOMACH with full glass of water, Disp: 12 tablet, Rfl: 0   atorvastatin (LIPITOR) 80 MG tablet, Take 1 tablet (80 mg total) by mouth daily., Disp: 90 tablet, Rfl: 3   Blood Glucose Monitoring Suppl (GNP TRUE METRIX GLUCOSE METER) w/Device KIT, , Disp: , Rfl:    clopidogrel (PLAVIX) 75 MG tablet, Take 1 tablet (75 mg total) by mouth daily., Disp: 90 tablet, Rfl: 1   ezetimibe (ZETIA) 10 MG tablet, Take 1 tablet (10 mg total) by mouth daily., Disp: 90  tablet, Rfl: 3   fenofibrate (TRICOR) 145 MG tablet, Take 1 tablet (145 mg total) by mouth daily., Disp: 90 tablet, Rfl: 1   furosemide (LASIX) 40 MG tablet, Take 1 tablet (40 mg total) by mouth 2 (two) times daily., Disp: 60 tablet, Rfl: 3   gabapentin (NEURONTIN) 300 MG capsule, Take 1 capsule (300 mg total) by mouth 4 (four) times daily. TAKE 1 CAPSULE BY MOUTH THREE TIMES DAILY FOR PAIN, Disp: 120 capsule, Rfl: 5   glucose blood (TRUE METRIX BLOOD GLUCOSE TEST) test strip, CHECK BLOOD SUGAR FOUR TIMES DAILY Dx E11.40, Disp: 400 each, Rfl: 3   insulin regular (HUMULIN R) 100 units/mL injection, INJECT 10 UNITS INTO THE SKIN THREE TIMES DAILY BEFORE MEALS, Disp: 30 mL, Rfl: 1   Insulin Syringe-Needle U-100 (GLOBAL INJECT EASE INSULIN SYR) 31G X 5/16" 0.3 ML MISC, USE WITH INSULIN EVERY DAY Dx E11.40, Disp: 100 each, Rfl: 3   Lancets (UNILET MICRO-THIN 33G) MISC, CHECK BLOOD SUGAR FOUR TIMES DAILY Dx E11.40, Disp: 400 each, Rfl: 3   metFORMIN (GLUCOPHAGE) 500 MG tablet, Take 1 tablet (500 mg total) by mouth 2 (two) times daily., Disp: 180 tablet, Rfl: 0   metoprolol tartrate (LOPRESSOR) 25 MG tablet, Take 1 tablet (25 mg total) by mouth 2 (two) times daily., Disp: 180 tablet, Rfl: 3   nitroGLYCERIN (NITROSTAT) 0.4 MG SL tablet, DISSOLVE 1 TABLET UNDER THE TONGUE EVERY 5 MINUTES AS NEEDED FOR CHEST PAIN. DO NOT EXCEED A TOTAL OF 3 DOSES  IN 15 MINUTES. (Patient taking differently: Place 0.4 mg under the tongue every 5 (five) minutes as needed for chest pain.), Disp: 25 tablet, Rfl: 2   pantoprazole (PROTONIX) 40 MG tablet, Take 1 tablet (40 mg total) by mouth daily., Disp: 90 tablet, Rfl: 1   tamsulosin (FLOMAX) 0.4 MG CAPS capsule, Take 1 capsule (0.4 mg total) by mouth daily., Disp: 90 capsule, Rfl: 1  Cognitive Assessment Identity Confirmed: : Name; DOB Cognitive Status: Normal Other:  : N/A   Functional Assessment Hearing Difficulty or Deaf: no Wear Glasses or Blind: yes Vision Management:  can see well w/ glasses Concentrating, Remembering or Making Decisions Difficulty (CP): no Difficulty Communicating: no Difficulty Eating/Swallowing: no Walking or Climbing Stairs Difficulty: no Walking or Climbing Stairs: ambulation difficulty, requires equipment Mobility Management: uses a cane and a walker at times Dressing/Bathing Difficulty: no Doing Errands Independently Difficulty (such as shopping) (CP): no   Caregiver Assessment  Primary Source of Support/Comfort: sibling(s) Name of Support/Comfort Primary Source: sister- [name] People in Home: alone Family Caregiver if Needed: none Primary Roles/Responsibilities: retired   Planned Interventions  Evaluation of current treatment plan related to hypertension self management and patient's adherence to plan as established by provider;   Reviewed prescribed diet low sodium Reviewed medications with patient and discussed importance of compliance;  Counseled on the importance of exercise goals with target of 150 minutes per week Discussed plans with patient for ongoing care management follow up and provided patient with direct contact information for care management team; Advised patient, providing education and rationale, to monitor blood pressure daily and record, calling PCP for findings outside established parameters;  Provided education on prescribed diet low sodium;  Discussed complications of poorly controlled blood pressure such as heart disease, stroke, circulatory complications, vision complications, kidney impairment, sexual dysfunction;  Screening for signs and symptoms of depression related to chronic disease state;  Assessed social determinant of health barriers;  Provided education to patient about basic DM disease process; Reviewed medications with patient and discussed importance of medication adherence;        Reviewed prescribed diet with patient carbohydrate modified; Counseled on importance of regular  laboratory monitoring as prescribed;        Discussed plans with patient for ongoing care management follow up and provided patient with direct contact information for care management team;      Provided patient with written educational materials related to hypo and hyperglycemia and importance of correct treatment;       Reviewed scheduled/upcoming provider appointments including: primary care provider;         Advised patient, providing education and rationale, to check cbg per MD order  and record        call provider for findings outside established parameters;       Review of patient status, including review of consultants reports, relevant laboratory and other test results, and medications completed;       Screening for signs and symptoms of depression related to chronic disease state;        Assessed social determinant of health barriers;        Reviewed safety precautions  Interaction and coordination with outside resources, practitioners, and providers See CCM Referral  Care Plan: Printed and mailed to patient

## 2022-12-24 NOTE — Telephone Encounter (Signed)
   CCM RN Visit Note   12/24/22 Name: KIRYN LERSCH MRN: [id number]      DOB: 1946/01/03  Subjective: KEISHA BOBST is a 77 y.o. year old male who is a primary care patient of [name] FNP. The patient was referred to the Chronic Care Management team for assistance with care management needs subsequent to provider initiation of CCM services and plan of care.      An unsuccessful telephone outreach was attempted today to contact the patient about Chronic Care Management needs.    Plan:Telephone follow up appointment with care management team member scheduled for:  upon care guide rescheduling.  Irving Shows [facility], BSN RN Case Manager [facility] [phone]

## 2022-12-24 NOTE — Patient Instructions (Signed)
Please call the care guide team at [phone] if you need to cancel or reschedule your appointment.   If you are experiencing a Mental Health or Behavioral Health Crisis or need someone to talk to, please call the Suicide and Crisis Lifeline: 988 call the [place] National Suicide Prevention Lifeline: 864-603-5485 or TTY: 239 288 0171 TTY 248-584-0878) to talk to a trained counselor call 1-800-273-TALK (toll free, 24 hour hotline) go to [facility] [address] [place] [phone]) call the [facility]: [phone] call 911   Following is a copy of the CCM Program Consent:  CCM service includes personalized support from designated clinical staff supervised by the physician, including individualized plan of care and coordination with other care providers 24/7 contact phone numbers for assistance for urgent and routine care needs. Service will only be billed when office clinical staff spend 20 minutes or more in a month to coordinate care. Only one practitioner may furnish and bill the service in a calendar month. The patient may stop CCM services at amy time (effective at the end of the month) by phone call to the office staff. The patient will be responsible for cost sharing (co-pay) or up to 20% of the service fee (after annual deductible is met)  Following is a copy of your full provider care plan:   Goals Addressed             This Visit's Progress    CCM (DIABETES) EXPECTED OUTCOME: MONITOR, SELF-MANAGE AND REDUCE SYMPTOMS OF DIABETES       Current Barriers:  Knowledge Deficits related to Diabetes management Chronic Disease Management support and education needs related to Diabetes, diet No Advanced Directives in place- pt declines Patient reports he lives alone, can call on his sister [name] if needed, pt states he still drives, is overall independent with his care, has cane and walker on hand but only uses if  needed, pt states he has had several falls in the past year. Patient reports he checks CBG "several times per day"  fasting ranges in 100's, none above 200, random ranges 120-140 with all under 300.  Planned Interventions: Provided education to patient about basic DM disease process; Reviewed medications with patient and discussed importance of medication adherence;        Reviewed prescribed diet with patient carbohydrate modified; Counseled on importance of regular laboratory monitoring as prescribed;        Discussed plans with patient for ongoing care management follow up and provided patient with direct contact information for care management team;      Provided patient with written educational materials related to hypo and hyperglycemia and importance of correct treatment;       Reviewed scheduled/upcoming provider appointments including: primary care provider;         Advised patient, providing education and rationale, to check cbg per MD order  and record        call provider for findings outside established parameters;       Review of patient status, including review of consultants reports, relevant laboratory and other test results, and medications completed;       Screening for signs and symptoms of depression related to chronic disease state;        Assessed social determinant of health barriers;        Reviewed safety precautions  Symptom Management: Take medications as prescribed   Attend all scheduled provider appointments Call pharmacy for medication refills 3-7 days in advance  of running out of medications Attend church or other social activities Perform all self care activities independently  Perform IADL's (shopping, preparing meals, housekeeping, managing finances) independently Call provider office for new concerns or questions  check blood sugar at prescribed times: per doctor's order  check feet daily for cuts, sores or redness enter blood sugar readings and  medication or insulin into daily log take the blood sugar log to all doctor visits take the blood sugar meter to all doctor visits set goal weight trim toenails straight across fill half of plate with vegetables limit fast food meals to no more than 1 per week prepare main meal at home 3 to 5 days each week read food labels for fat, fiber, carbohydrates and portion size wash and dry feet carefully every day Look over education mailed- hypoglycemia fall prevention strategies: change position slowly, use assistive device such as walker or cane (per provider recommendations) when walking, keep walkways clear, have good lighting in room. It is important to contact your provider if you have any falls, maintain muscle strength/tone by exercise per provider recommendations.  Follow Up Plan: Telephone follow up appointment with care management team member scheduled for:  03/10/23 at 3 pm       CCM (HYPERTENSION) EXPECTED OUTCOME: MONITOR, SELF-MANAGE AND REDUCE SYMPTOMS OF HYPERTENSION       Current Barriers:  Knowledge Deficits related to Hypertension management Chronic Disease Management support and education needs related to Hypertension, diet No Advanced Directives in place- pt declines Patient reports he checks blood pressure several times per week and "readings are alright" Patient reports he has all medications on hand and taking as prescribed, receives prefilled medications from [facility]  Planned Interventions: Evaluation of current treatment plan related to hypertension self management and patient's adherence to plan as established by provider;   Reviewed prescribed diet low sodium Reviewed medications with patient and discussed importance of compliance;  Counseled on the importance of exercise goals with target of 150 minutes per week Discussed plans with patient for ongoing care management follow up and provided patient with direct contact information for care management  team; Advised patient, providing education and rationale, to monitor blood pressure daily and record, calling PCP for findings outside established parameters;  Provided education on prescribed diet low sodium;  Discussed complications of poorly controlled blood pressure such as heart disease, stroke, circulatory complications, vision complications, kidney impairment, sexual dysfunction;  Screening for signs and symptoms of depression related to chronic disease state;  Assessed social determinant of health barriers;   Symptom Management: Take medications as prescribed   Attend all scheduled provider appointments Call pharmacy for medication refills 3-7 days in advance of running out of medications Attend church or other social activities Perform all self care activities independently  Perform IADL's (shopping, preparing meals, housekeeping, managing finances) independently Call provider office for new concerns or questions  check blood pressure 3 times per week choose a place to take my blood pressure (home, clinic or office, retail store) write blood pressure results in a log or diary learn about high blood pressure keep a blood pressure log take blood pressure log to all doctor appointments call doctor for signs and symptoms of high blood pressure take medications for blood pressure exactly as prescribed begin an exercise program report new symptoms to your doctor eat more whole grains, fruits and vegetables, lean meats and healthy fats Look over education mailed- low sodium diet  Follow Up Plan: Telephone follow up appointment with care management  team member scheduled for:  03/10/23 at 3 pm          The patient verbalized understanding of instructions, educational materials, and care plan provided today and agreed to receive a mailed copy of patient instructions, educational materials, and care plan.  Telephone follow up appointment with care management team member scheduled  for:  03/10/23 at 3 pm  Hypoglycemia Hypoglycemia is when the sugar (glucose) level in your blood is too low. Low blood sugar can happen to people who have diabetes and people who do not have diabetes. Low blood sugar can happen quickly, and it can be an emergency. What are the causes? This condition happens most often in people who have diabetes. It may be caused by: Diabetes medicine. Not eating enough, or not eating often enough. Doing more physical activity. Drinking alcohol on an empty stomach. If you do not have diabetes, this condition may be caused by: A tumor in the pancreas. Not eating enough, or not eating for long periods at a time (fasting). A very bad infection or illness. Problems after having weight loss (bariatric) surgery. Kidney failure or liver failure. Certain medicines. What increases the risk? This condition is more likely to develop in people who: Have diabetes and take medicines to lower their blood sugar. Abuse alcohol. Have a very bad illness. What are the signs or symptoms? Mild Hunger. Sweating and feeling clammy. Feeling dizzy or light-headed. Being sleepy or having trouble sleeping. Feeling like you may vomit (nauseous). A fast heartbeat. A headache. Blurry vision. Mood changes, such as: Being grouchy. Feeling worried or nervous (anxious). Tingling or loss of feeling (numbness) around your mouth, lips, or tongue. Moderate Confusion and poor judgment. Behavior changes. Weakness. Uneven heartbeat. Trouble with moving (coordination). Very low Very low blood sugar (severe hypoglycemia) is a medical emergency. It can cause: Fainting. Seizures. Loss of consciousness (coma). Death. How is this treated? Treating low blood sugar Low blood sugar is often treated by eating or drinking something that has sugar in it right away. The food or drink should contain 15 grams of a fast-acting carb (carbohydrate). Options include: 4 oz (120 mL) of fruit  juice. 4 oz (120 mL) of regular soda (not diet soda). A few pieces of hard candy. Check food labels to see how many pieces to eat for 15 grams. 1 Tbsp (15 mL) of sugar or honey. 4 glucose tablets. 1 tube of glucose gel. Treating low blood sugar if you have diabetes If you can think clearly and swallow safely, follow the 15:15 rule: Take 15 grams of a fast-acting carb. Talk with your doctor about how much you should take. Always keep a source of fast-acting carb with you, such as: Glucose tablets (take 4 tablets). A few pieces of hard candy. Check food labels to see how many pieces to eat for 15 grams. 4 oz (120 mL) of fruit juice. 4 oz (120 mL) of regular soda (not diet soda). 1 Tbsp (15 mL) of honey or sugar. 1 tube of glucose gel. Check your blood sugar 15 minutes after you take the carb. If your blood sugar is still at or below 70 mg/dL (3.9 mmol/L), take 15 grams of a carb again. If your blood sugar does not go above 70 mg/dL (3.9 mmol/L) after 3 tries, get help right away. After your blood sugar goes back to normal, eat a meal or a snack within 1 hour.  Treating very low blood sugar If your blood sugar is below 54 mg/dL (3  mmol/L), you have very low blood sugar, or severe hypoglycemia. This is an emergency. Get medical help right away. If you have very low blood sugar and you cannot eat or drink, you will need to be given a hormone called glucagon. A family member or friend should learn how to check your blood sugar and how to give you glucagon. Ask your doctor if you need to have an emergency glucagon kit at home. Very low blood sugar may also need to be treated in a hospital. Follow these instructions at home: General instructions Take over-the-counter and prescription medicines only as told by your doctor. Stay aware of your blood sugar as told by your doctor. If you drink alcohol: Limit how much you have to: 0-1 drink a day for women who are not pregnant. 0-2 drinks a day for  men. Know how much alcohol is in your drink. In the [place] one drink equals one 12 oz bottle of beer (355 mL), one 5 oz glass of wine (148 mL), or one 1 oz glass of hard liquor (44 mL). Be sure to eat food when you drink alcohol. Know that your body absorbs alcohol quickly. This may lead to low blood sugar later. Be sure to keep checking your blood sugar. Keep all follow-up visits. If you have diabetes:  Always have a fast-acting carb (15 grams) with you to treat low blood sugar. Follow your diabetes care plan as told by your doctor. Make sure you: Know the symptoms of low blood sugar. Check your blood sugar as often as told. Always check it before and after exercise. Always check your blood sugar before you drive. Take your medicines as told. Follow your meal plan. Eat on time. Do not skip meals. Share your diabetes care plan with: Your work or school. People you live with. Carry a card or wear jewelry that says you have diabetes. Where to find more information American Diabetes Association: www.diabetes.org Contact a doctor if: You have trouble keeping your blood sugar in your target range. You have low blood sugar often. Get help right away if: You still have symptoms after you eat or drink something that contains 15 grams of fast-acting carb, and you cannot get your blood sugar above 70 mg/dL by following the 15:15 rule. Your blood sugar is below 54 mg/dL (3 mmol/L). You have a seizure. You faint. These symptoms may be an emergency. Get help right away. Call your local emergency services (911 in the [place] Do not wait to see if the symptoms will go away. Do not drive yourself to the hospital. Summary Hypoglycemia happens when the level of sugar (glucose) in your blood is too low. Low blood sugar can happen to people who have diabetes and people who do not have diabetes. Low blood sugar can happen quickly, and it can be an emergency. Make sure you know the symptoms of low blood  sugar and know how to treat it. Always keep a source of sugar (fast-acting carb) with you to treat low blood sugar. This information is not intended to replace advice given to you by your health care provider. Make sure you discuss any questions you have with your health care provider. Document Revised: 07/31/2020 Document Reviewed: 07/31/2020 Elsevier Patient Education  [place]. Low-Sodium Eating Plan Sodium, which is an element that makes up salt, helps you maintain a healthy balance of fluids in your body. Too much sodium can increase your blood pressure and cause fluid and waste to be held in  your body. Your health care provider or dietitian may recommend following this plan if you have high blood pressure (hypertension), kidney disease, liver disease, or heart failure. Eating less sodium can help lower your blood pressure, reduce swelling, and protect your heart, liver, and kidneys. What are tips for following this plan? Reading food labels The Nutrition Facts label lists the amount of sodium in one serving of the food. If you eat more than one serving, you must multiply the listed amount of sodium by the number of servings. Choose foods with less than 140 mg of sodium per serving. Avoid foods with 300 mg of sodium or more per serving. Shopping  Look for lower-sodium products, often labeled as "low-sodium" or "no salt added." Always check the sodium content, even if foods are labeled as "unsalted" or "no salt added." Buy fresh foods. Avoid canned foods and pre-made or frozen meals. Avoid canned, cured, or processed meats. Buy breads that have less than 80 mg of sodium per slice. Cooking  Eat more home-cooked food and less restaurant, buffet, and fast food. Avoid adding salt when cooking. Use salt-free seasonings or herbs instead of table salt or sea salt. Check with your health care provider or pharmacist before using salt substitutes. Cook with plant-based oils, such as  canola, sunflower, or olive oil. Meal planning When eating at a restaurant, ask that your food be prepared with less salt or no salt, if possible. Avoid dishes labeled as brined, pickled, cured, smoked, or made with soy sauce, miso, or teriyaki sauce. Avoid foods that contain MSG (monosodium glutamate). MSG is sometimes added to [place] food, bouillon, and some canned foods. Make meals that can be grilled, baked, poached, roasted, or steamed. These are generally made with less sodium. General information Most people on this plan should limit their sodium intake to 1,500-2,000 mg (milligrams) of sodium each day. What foods should I eat? Fruits Fresh, frozen, or canned fruit. Fruit juice. Vegetables Fresh or frozen vegetables. "No salt added" canned vegetables. "No salt added" tomato sauce and paste. Low-sodium or reduced-sodium tomato and vegetable juice. Grains Low-sodium cereals, including oats, puffed wheat and rice, and shredded wheat. Low-sodium crackers. Unsalted rice. Unsalted pasta. Low-sodium bread. Whole-grain breads and whole-grain pasta. Meats and other proteins Fresh or frozen (no salt added) meat, poultry, seafood, and fish. Low-sodium canned tuna and salmon. Unsalted nuts. Dried peas, beans, and lentils without added salt. Unsalted canned beans. Eggs. Unsalted nut butters. Dairy Milk. Soy milk. Cheese that is naturally low in sodium, such as ricotta cheese, fresh mozzarella, or Swiss cheese. Low-sodium or reduced-sodium cheese. Cream cheese. Yogurt. Seasonings and condiments Fresh and dried herbs and spices. Salt-free seasonings. Low-sodium mustard and ketchup. Sodium-free salad dressing. Sodium-free light mayonnaise. Fresh or refrigerated horseradish. Lemon juice. Vinegar. Other foods Homemade, reduced-sodium, or low-sodium soups. Unsalted popcorn and pretzels. Low-salt or salt-free chips. The items listed above may not be a complete list of foods and beverages you can eat.  Contact a dietitian for more information. What foods should I avoid? Vegetables Sauerkraut, pickled vegetables, and relishes. Olives. [place] fries. Onion rings. Regular canned vegetables (not low-sodium or reduced-sodium). Regular canned tomato sauce and paste (not low-sodium or reduced-sodium). Regular tomato and vegetable juice (not low-sodium or reduced-sodium). Frozen vegetables in sauces. Grains Instant hot cereals. Bread stuffing, pancake, and biscuit mixes. Croutons. Seasoned rice or pasta mixes. Noodle soup cups. Boxed or frozen macaroni and cheese. Regular salted crackers. Self-rising flour. Meats and other proteins Meat or fish that is salted, canned,  smoked, spiced, or pickled. Precooked or cured meat, such as sausages or meat loaves. Berniece Salines. Ham. Pepperoni. Hot dogs. Corned beef. Chipped beef. Salt pork. Jerky. Pickled herring. Anchovies and sardines. Regular canned tuna. Salted nuts. Dairy Processed cheese and cheese spreads. Hard cheeses. Cheese curds. Blue cheese. Feta cheese. String cheese. Regular cottage cheese. Buttermilk. Canned milk. Fats and oils Salted butter. Regular margarine. Ghee. Bacon fat. Seasonings and condiments Onion salt, garlic salt, seasoned salt, table salt, and sea salt. Canned and packaged gravies. Worcestershire sauce. Tartar sauce. Barbecue sauce. Teriyaki sauce. Soy sauce, including reduced-sodium. Steak sauce. Fish sauce. Oyster sauce. Cocktail sauce. Horseradish that you find on the shelf. Regular ketchup and mustard. Meat flavorings and tenderizers. Bouillon cubes. Hot sauce. Pre-made or packaged marinades. Pre-made or packaged taco seasonings. Relishes. Regular salad dressings. Salsa. Other foods Salted popcorn and pretzels. Corn chips and puffs. Potato and tortilla chips. Canned or dried soups. Pizza. Frozen entrees and pot pies. The items listed above may not be a complete list of foods and beverages you should avoid. Contact a dietitian for more  information. Summary Eating less sodium can help lower your blood pressure, reduce swelling, and protect your heart, liver, and kidneys. Most people on this plan should limit their sodium intake to 1,500-2,000 mg (milligrams) of sodium each day. Canned, boxed, and frozen foods are high in sodium. Restaurant foods, fast foods, and pizza are also very high in sodium. You also get sodium by adding salt to food. Try to cook at home, eat more fresh fruits and vegetables, and eat less fast food and canned, processed, or prepared foods. This information is not intended to replace advice given to you by your health care provider. Make sure you discuss any questions you have with your health care provider. Document Revised: 08/06/2019 Document Reviewed: 08/01/2019 Elsevier Patient Education  [place].

## 2022-12-24 NOTE — Telephone Encounter (Signed)
Patient returning call call back # [phone]

## 2022-12-24 NOTE — Chronic Care Management (AMB) (Signed)
Chronic Care Management   CCM RN Visit Note  12/24/2022 Name: Todd Zhang MRN: [id number] DOB: 20-Mar-1946  Subjective: Todd Zhang is a 77 y.o. year old male who is a primary care patient of Todd Laroche, FNP. The patient was referred to the Chronic Care Management team for assistance with care management needs subsequent to provider initiation of CCM services and plan of care.    Today's Visit:  Engaged with patient by telephone for initial visit.     SDOH Interventions Today    Flowsheet Row Most Recent Value  SDOH Interventions   Food Insecurity Interventions Intervention Not Indicated  Housing Interventions Intervention Not Indicated  Transportation Interventions Intervention Not Indicated  Utilities Interventions Intervention Not Indicated  Financial Strain Interventions Intervention Not Indicated  Physical Activity Interventions Intervention Not Indicated  Stress Interventions Intervention Not Indicated  Social Connections Interventions Intervention Not Indicated         Goals Addressed             This Visit's Progress    CCM (DIABETES) EXPECTED OUTCOME: MONITOR, SELF-MANAGE AND REDUCE SYMPTOMS OF DIABETES       Current Barriers:  Knowledge Deficits related to Diabetes management Chronic Disease Management support and education needs related to Diabetes, diet No Advanced Directives in place- pt declines Patient reports he lives alone, can call on his sister Todd Zhang if needed, pt states he still drives, is overall independent with his care, has cane and walker on hand but only uses if needed, pt states he has had several falls in the past year. Patient reports he checks CBG "several times per day"  fasting ranges in 100's, none above 200, random ranges 120-140 with all under 300.  Planned Interventions: Provided education to patient about basic DM disease process; Reviewed medications with patient and discussed importance of medication adherence;         Reviewed prescribed diet with patient carbohydrate modified; Counseled on importance of regular laboratory monitoring as prescribed;        Discussed plans with patient for ongoing care management follow up and provided patient with direct contact information for care management team;      Provided patient with written educational materials related to hypo and hyperglycemia and importance of correct treatment;       Reviewed scheduled/upcoming provider appointments including: primary care provider;         Advised patient, providing education and rationale, to check cbg per MD order  and record        call provider for findings outside established parameters;       Review of patient status, including review of consultants reports, relevant laboratory and other test results, and medications completed;       Screening for signs and symptoms of depression related to chronic disease state;        Assessed social determinant of health barriers;        Reviewed safety precautions  Symptom Management: Take medications as prescribed   Attend all scheduled provider appointments Call pharmacy for medication refills 3-7 days in advance of running out of medications Attend church or other social activities Perform all self care activities independently  Perform IADL's (shopping, preparing meals, housekeeping, managing finances) independently Call provider office for new concerns or questions  check blood sugar at prescribed times: per doctor's order  check feet daily for cuts, sores or redness enter blood sugar readings and medication or insulin into daily log take the blood sugar log  to all doctor visits take the blood sugar meter to all doctor visits set goal weight trim toenails straight across fill half of plate with vegetables limit fast food meals to no more than 1 per week prepare main meal at home 3 to 5 days each week read food labels for fat, fiber, carbohydrates and portion size wash  and dry feet carefully every day Look over education mailed- hypoglycemia fall prevention strategies: change position slowly, use assistive device such as walker or cane (per provider recommendations) when walking, keep walkways clear, have good lighting in room. It is important to contact your provider if you have any falls, maintain muscle strength/tone by exercise per provider recommendations.  Follow Up Plan: Telephone follow up appointment with care management team member scheduled for:  03/10/23 at 3 pm       CCM (HYPERTENSION) EXPECTED OUTCOME: MONITOR, SELF-MANAGE AND REDUCE SYMPTOMS OF HYPERTENSION       Current Barriers:  Knowledge Deficits related to Hypertension management Chronic Disease Management support and education needs related to Hypertension, diet No Advanced Directives in place- pt declines Patient reports he checks blood pressure several times per week and "readings are alright" Patient reports he has all medications on hand and taking as prescribed, receives prefilled medications from [facility]  Planned Interventions: Evaluation of current treatment plan related to hypertension self management and patient's adherence to plan as established by provider;   Reviewed prescribed diet low sodium Reviewed medications with patient and discussed importance of compliance;  Counseled on the importance of exercise goals with target of 150 minutes per week Discussed plans with patient for ongoing care management follow up and provided patient with direct contact information for care management team; Advised patient, providing education and rationale, to monitor blood pressure daily and record, calling PCP for findings outside established parameters;  Provided education on prescribed diet low sodium;  Discussed complications of poorly controlled blood pressure such as heart disease, stroke, circulatory complications, vision complications, kidney impairment, sexual dysfunction;   Screening for signs and symptoms of depression related to chronic disease state;  Assessed social determinant of health barriers;   Symptom Management: Take medications as prescribed   Attend all scheduled provider appointments Call pharmacy for medication refills 3-7 days in advance of running out of medications Attend church or other social activities Perform all self care activities independently  Perform IADL's (shopping, preparing meals, housekeeping, managing finances) independently Call provider office for new concerns or questions  check blood pressure 3 times per week choose a place to take my blood pressure (home, clinic or office, retail store) write blood pressure results in a log or diary learn about high blood pressure keep a blood pressure log take blood pressure log to all doctor appointments call doctor for signs and symptoms of high blood pressure take medications for blood pressure exactly as prescribed begin an exercise program report new symptoms to your doctor eat more whole grains, fruits and vegetables, lean meats and healthy fats Look over education mailed- low sodium diet  Follow Up Plan: Telephone follow up appointment with care management team member scheduled for:  03/10/23 at 3 pm          Plan:Telephone follow up appointment with care management team member scheduled for:  03/10/23 at 3 pm  Irving Shows [facility], BSN RN Case Manager [facility] [phone]

## 2023-01-11 DIAGNOSIS — E1159 Type 2 diabetes mellitus with other circulatory complications: Secondary | ICD-10-CM | POA: Diagnosis not present

## 2023-01-11 DIAGNOSIS — I10 Essential (primary) hypertension: Secondary | ICD-10-CM | POA: Diagnosis not present

## 2023-01-11 DIAGNOSIS — Z794 Long term (current) use of insulin: Secondary | ICD-10-CM

## 2023-01-11 IMAGING — DX DG CHEST 1V PORT
1 series · 1 of 1 positions shown · non-contrast
Comparison: Radiographs 04/21/2021 and 07/03/2020.

CLINICAL DATA: Left flank pain. Possible sepsis. History of
diabetes, hypertension and atrial fibrillation.

EXAM:
PORTABLE CHEST 1 VIEW

[chest ap]
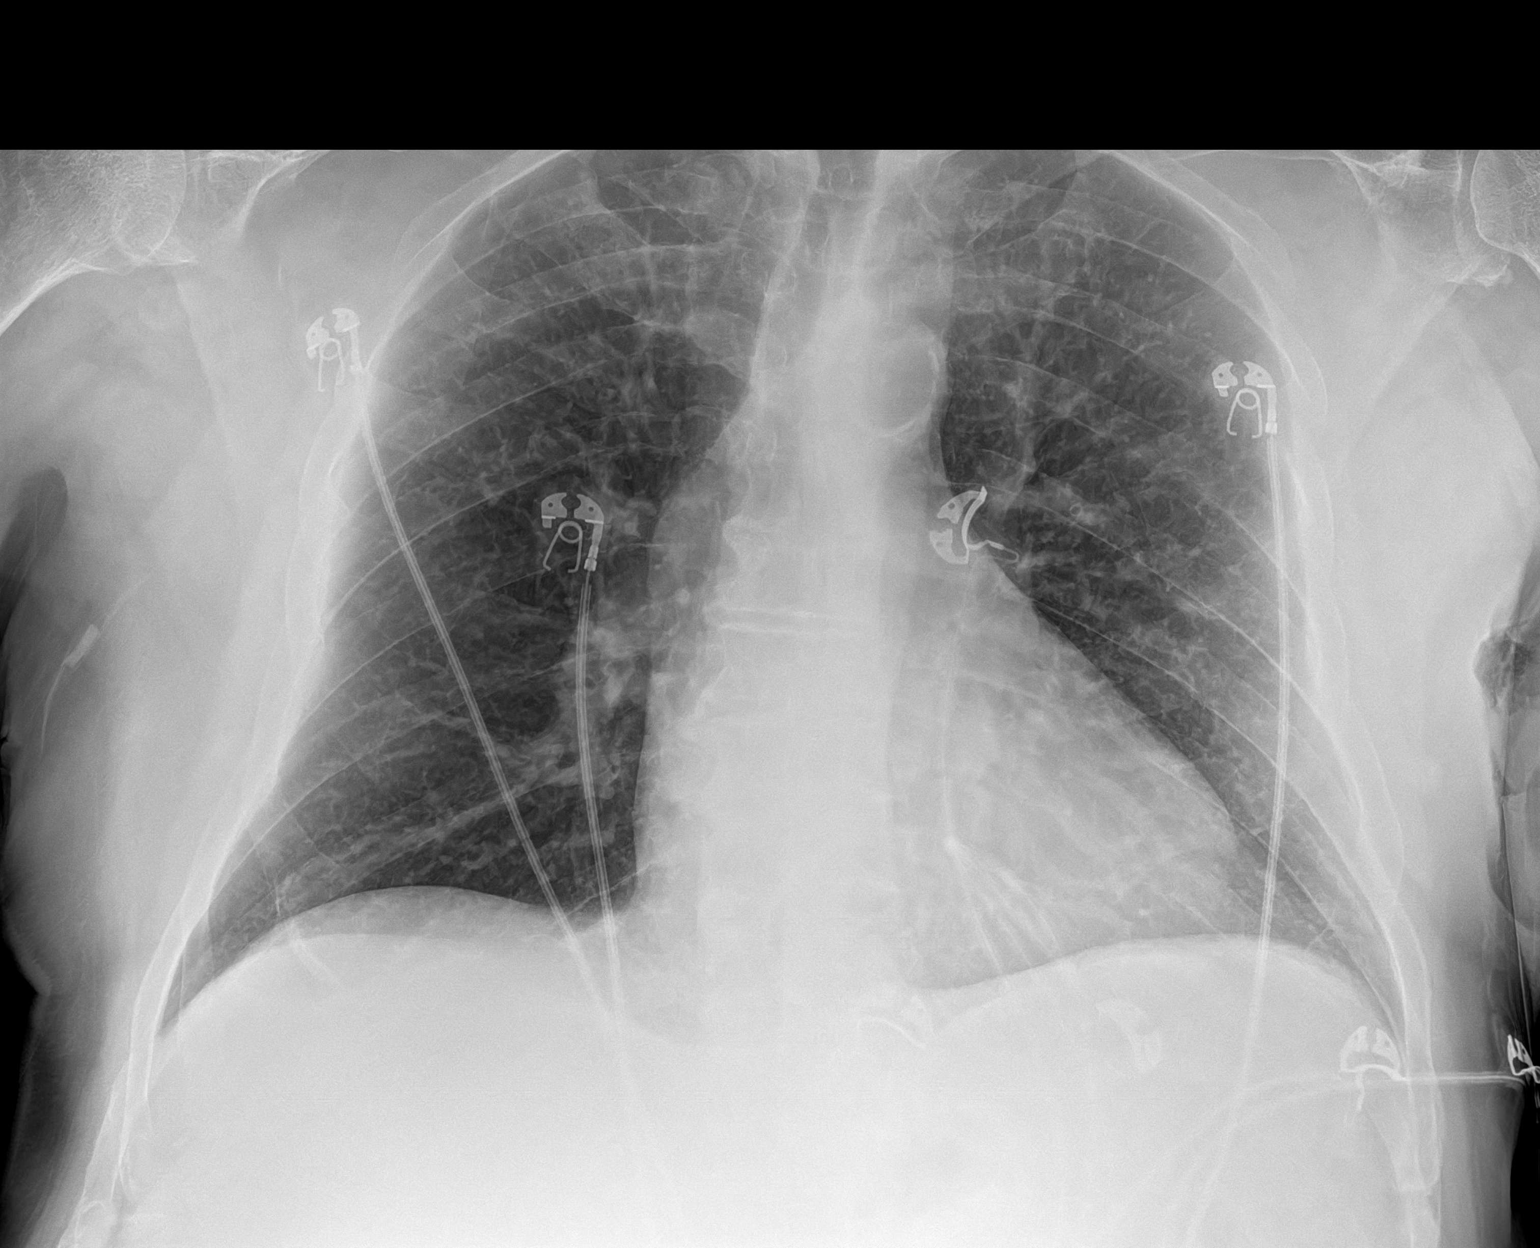

[1 of 1 positions shown; findings below may reference images not displayed]

FINDINGS: 3308 hours. The heart size and mediastinal contours are stable with
aortic atherosclerosis. The lungs appear clear. There is no pleural
effusion or pneumothorax. No acute osseous findings are evident.
There are degenerative changes in the spine associated with a mild
scoliosis. Telemetry leads overlie the chest.
IMPRESSION: Stable chest.  No acute cardiopulmonary process.

## 2023-01-11 IMAGING — MR MR FOOT*L* W/O CM
6 series · 40 of 40 positions shown · non-contrast
Comparison: None.

CLINICAL DATA: Foot swelling.  Left great toe redness and swelling.

EXAM:
MRI OF THE LEFT FOOT WITHOUT CONTRAST
TECHNIQUE: Multiplanar, multisequence MR imaging of the left foot was
performed. No intravenous contrast was administered.

[Series 6: T1 · coronal · left · 3.0mm · 0.47mm/px · 8 of 52 slices shown (1 of 3)]
[im 1/52]
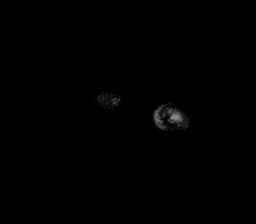
[im 8/52]
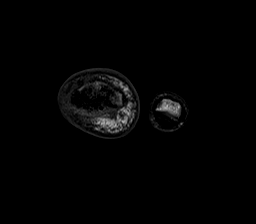
[im 15/52]
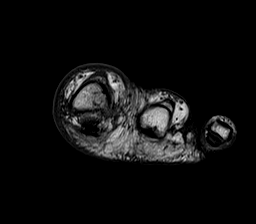
[im 22/52]
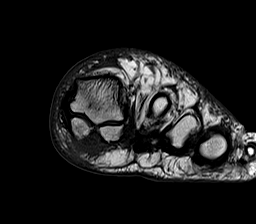
[im 30/52]
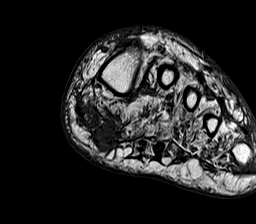
[im 37/52]
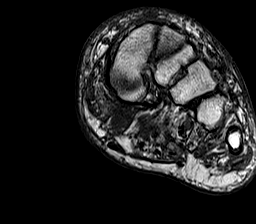
[im 44/52]
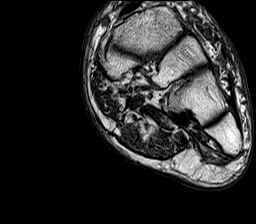
[im 52/52]
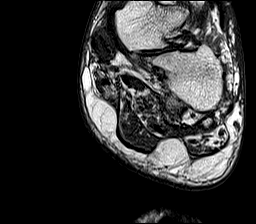

[Series 7: T2 fat-sat · coronal · left · 3.0mm · 0.44mm/px · 9 of 52 slices shown (1 of 2)]
[im 1/52]
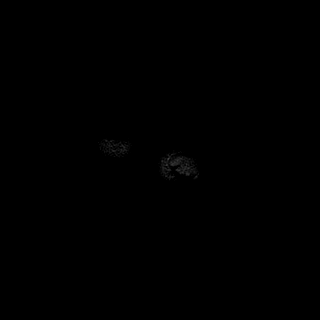
[im 7/52]
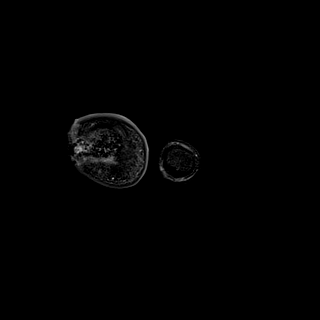
[im 13/52]
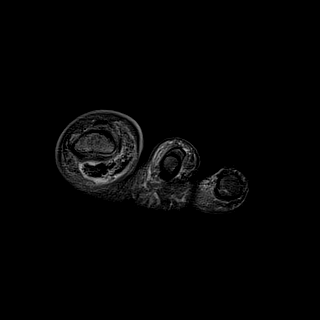
[im 20/52]
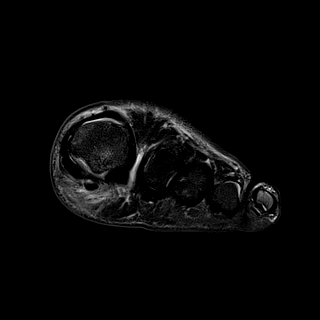
[im 26/52]
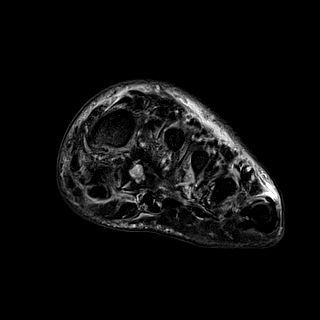
[im 32/52]
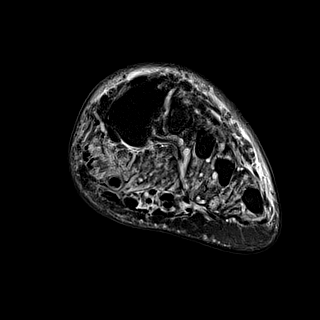
[im 39/52]
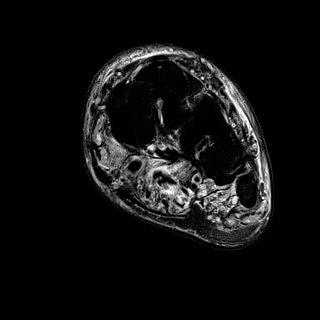
[im 45/52]
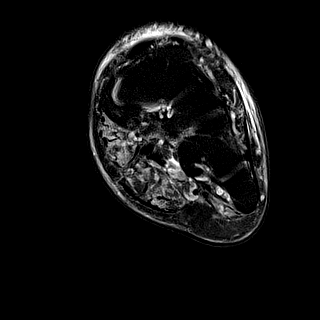
[im 52/52]
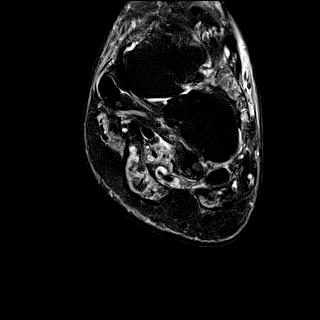

[Series 8: T1 · coronal · left · 3.0mm · 0.55mm/px · 9 of 52 slices shown (2 of 3)]
[im 1/52]
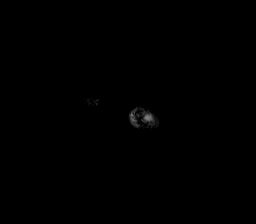
[im 7/52]
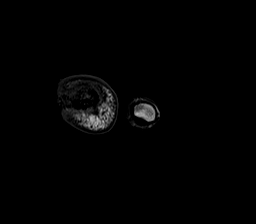
[im 13/52]
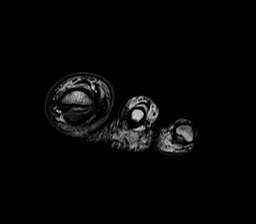
[im 20/52]
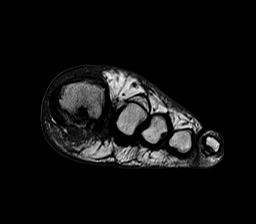
[im 26/52]
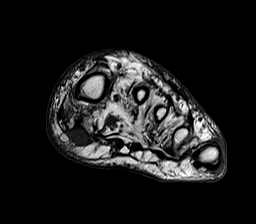
[im 32/52]
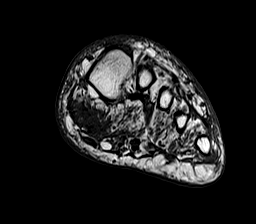
[im 39/52]
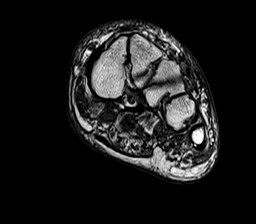
[im 45/52]
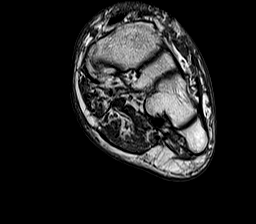
[im 52/52]
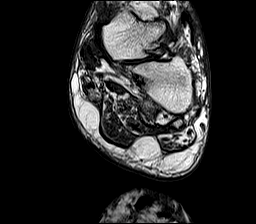

[Series 9: T1 · oblique · left · 3.0mm · 0.78mm/px · 4 of 24 slices shown (3 of 3)]
[im 1/24]
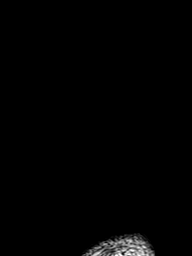
[im 8/24]
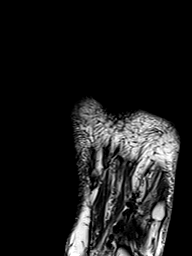
[im 16/24]
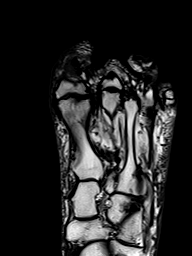
[im 24/24]
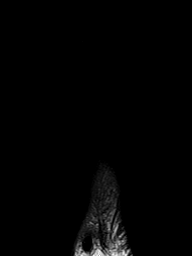

[Series 10: T2 fat-sat · oblique · left · 3.0mm · 0.78mm/px · 4 of 24 slices shown (2 of 2)]
[im 1/24]
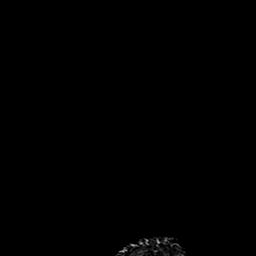
[im 8/24]
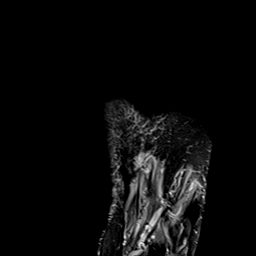
[im 16/24]
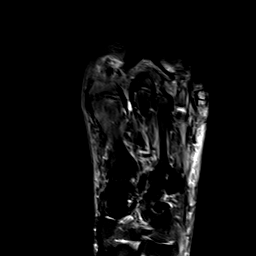
[im 24/24]
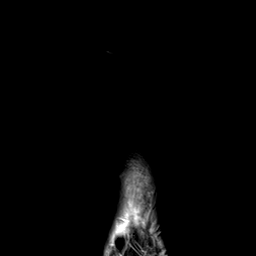

[Series 11: STIR · sagittal · left · 3.0mm · 0.78mm/px · 6 of 34 slices shown]
[im 1/34]
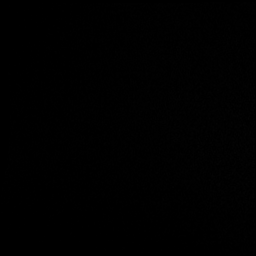
[im 7/34]
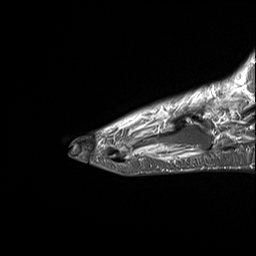
[im 14/34]
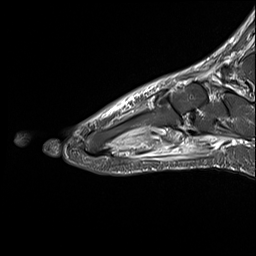
[im 20/34]
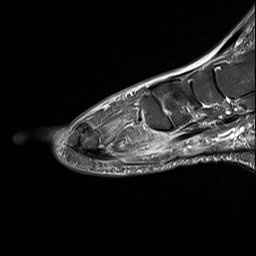
[im 27/34]
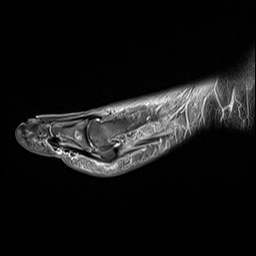
[im 34/34]
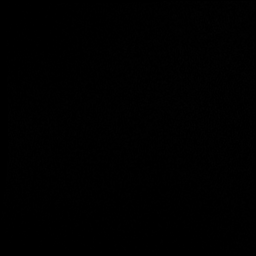

[40 of 40 positions shown; findings below may reference images not displayed]

FINDINGS: Bones/Joint/Cartilage

Soft tissue wound along the medial aspect of the distal great toe.
Bone marrow edema and bone destruction of the base of the first
distal phalanx most concerning for osteomyelitis. Bone marrow edema
in the first proximal phalanx which may be reactive versus secondary
to osteomyelitis.

Severe osteoarthritis of the first MTP joint with subchondral
reactive marrow edema.

Normal alignment. No joint effusion.

Ligaments

Collateral ligaments are intact.  Lisfranc ligament is intact.

Muscles and Tendons

Flexor, peroneal and extensor compartment tendons are intact.
Generalized muscle atrophy.

Soft tissue
No fluid collection or hematoma. 6 mm T2 hyperintense soft tissue
nodule along the plantar aspect of the foot at the level of the
metatarsal neck between the first and second metatarsals which may
reflect a small neuroma or vascular malformation.
IMPRESSION: 1. Soft tissue wound along the medial aspect of the distal great
toe. Bone marrow edema and bone destruction of the base of the first
distal phalanx most concerning for osteomyelitis. Bone marrow edema
in the first proximal phalanx which may be reactive versus secondary
to osteomyelitis.
2. Severe osteoarthritis of the first MTP joint with subchondral
reactive marrow edema.

## 2023-01-20 ENCOUNTER — Other Ambulatory Visit: Payer: Self-pay | Admitting: Family Medicine

## 2023-01-21 ENCOUNTER — Telehealth: Payer: Self-pay | Admitting: Family Medicine

## 2023-01-21 NOTE — Telephone Encounter (Signed)
Contacted Todd Zhang to schedule their annual wellness visit. Appointment made for 02/02/2023.   Thank you,  [name],  AMB Clinical Support [facility] AWV Program Direct Dial ??[phone]

## 2023-01-24 ENCOUNTER — Encounter: Payer: Self-pay | Admitting: Podiatry

## 2023-01-24 ENCOUNTER — Ambulatory Visit (INDEPENDENT_AMBULATORY_CARE_PROVIDER_SITE_OTHER): Payer: Medicare HMO | Admitting: Podiatry

## 2023-01-24 DIAGNOSIS — Z794 Long term (current) use of insulin: Secondary | ICD-10-CM

## 2023-01-24 DIAGNOSIS — Z9889 Other specified postprocedural states: Secondary | ICD-10-CM

## 2023-01-24 DIAGNOSIS — E1140 Type 2 diabetes mellitus with diabetic neuropathy, unspecified: Secondary | ICD-10-CM

## 2023-01-24 DIAGNOSIS — Z899 Acquired absence of limb, unspecified: Secondary | ICD-10-CM

## 2023-01-24 NOTE — Progress Notes (Signed)
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 Pain today located in the left leg and hip due to surgical shoe     DOS: 10/26/22 Procedure: Left second digit amputation  77 y.o. male returns for POV#4. Relates doing well with minimal pain.    Review of Systems: Negative except as noted in the HPI. Denies N/V/F/Ch.  Past Medical History:  Diagnosis Date   Atrial fibrillation (HCC)    Cervical spinal stenosis    DDD (degenerative disc disease), cervical    DDD (degenerative disc disease), lumbar    Diabetes mellitus without complication (HCC)    Dyspnea    Hyperlipidemia    Hypertension    Peripheral artery disease (HCC)     Current Outpatient Medications:    alendronate (FOSAMAX) 70 MG tablet, TAKE 1 TABLET BY MOUTH ONCE WEEKLY ON SUNDAY ON ON AN EMPTY STOMACH with full glass of water, Disp: 12 tablet, Rfl: 0   atorvastatin (LIPITOR) 80 MG tablet, Take 1 tablet (80 mg total) by mouth daily., Disp: 90 tablet, Rfl: 3   Blood Glucose Monitoring Suppl (GNP TRUE METRIX GLUCOSE METER) w/Device KIT, , Disp: , Rfl:    clopidogrel (PLAVIX) 75 MG tablet, Take 1 tablet (75 mg total) by mouth daily., Disp: 90 tablet, Rfl: 1   ezetimibe (ZETIA) 10 MG tablet, Take 1 tablet (10 mg total) by mouth daily., Disp: 90 tablet, Rfl: 3   fenofibrate (TRICOR) 145 MG tablet, Take 1 tablet (145 mg total) by mouth daily., Disp: 90 tablet, Rfl: 1   furosemide (LASIX) 40 MG tablet, Take 1 tablet (40 mg total) by mouth 2 (two) times daily., Disp: 60 tablet, Rfl: 3   gabapentin (NEURONTIN) 300 MG capsule, Take 1 capsule (300 mg total) by mouth 4 (four) times daily. TAKE 1 CAPSULE BY MOUTH THREE TIMES DAILY FOR PAIN, Disp: 120 capsule, Rfl: 5   glucose blood (TRUE METRIX BLOOD GLUCOSE TEST) test strip,  CHECK BLOOD SUGAR FOUR TIMES DAILY Dx E11.40, Disp: 400 each, Rfl: 3   insulin regular (HUMULIN R) 100 units/mL injection, INJECT 10 UNITS INTO THE SKIN THREE TIMES DAILY BEFORE MEALS, Disp: 30 mL, Rfl: 1   Insulin Syringe-Needle U-100 (EASY TOUCH INSULIN SYRINGE) 31G X 5/16" 0.3 ML MISC, USE WITH INSULIN EVERY DAY Dx E11.40, Disp: 100 each, Rfl: 3   Lancets (UNILET MICRO-THIN 33G) MISC, CHECK BLOOD SUGAR FOUR TIMES DAILY Dx E11.40, Disp: 400 each, Rfl: 3   metFORMIN (GLUCOPHAGE) 500 MG tablet, Take 1 tablet (500 mg total) by mouth 2 (two) times daily., Disp: 180 tablet, Rfl: 0   metoprolol tartrate (LOPRESSOR) 25 MG tablet, Take 1 tablet (25 mg total) by mouth 2 (two) times daily., Disp: 180 tablet, Rfl: 3   nitroGLYCERIN (NITROSTAT) 0.4 MG SL tablet, DISSOLVE 1 TABLET UNDER THE TONGUE EVERY 5 MINUTES AS NEEDED FOR CHEST PAIN. DO NOT EXCEED A TOTAL OF 3 DOSES IN 15 MINUTES. (Patient taking differently: Place 0.4 mg under the tongue every 5 (five) minutes as needed for chest pain.), Disp: 25 tablet, Rfl: 2   pantoprazole (PROTONIX) 40 MG tablet, Take 1 tablet (40 mg total) by mouth daily., Disp: 90 tablet, Rfl: 1   tamsulosin (FLOMAX) 0.4 MG CAPS capsule, Take 1 capsule (0.4 mg total) by  mouth daily., Disp: 90 capsule, Rfl: 1  Social History   Tobacco Use  Smoking Status Former   Packs/day: 1   Types: Cigarettes   Start date: 06/19/1966   Quit date: 06/20/2011   Years since quitting: 11.6   Passive exposure: Past  Smokeless Tobacco Never    Allergies  Allergen Reactions   Asa [Aspirin] Hives   Penicillins Hives    Hives + vomiting as an adult  - tolerated Ceftriaxone 04/2021   Tetanus Toxoid     unknown   Objective:  There were no vitals filed for this visit. There is no height or weight on file to calculate BMI. Constitutional Well developed. Well nourished.  Vascular Foot warm and well perfused. Capillary refill normal to all digits.   Neurologic Normal speech. Oriented to  person, place, and time. Epicritic sensation to light touch grossly present bilaterally.  Dermatologic Skin healing well without signs of infection. Skin edges well coapted without signs of infection.  Orthopedic: Tenderness to palpation noted about the surgical site.   Radiographs: Interval resection of left second digit.  Assessment:   1. Post-operative state   2. Type 2 diabetes mellitus with diabetic neuropathy, with long-term current use of insulin (HCC)   3. History of amputation      Plan:  Patient was evaluated and treated and all questions answered.  S/p foot surgery left -Progressing as expected post-operatively. -WB Status: WBAT in regular shoe.  -Medications: n/a -DM shoe fitting Patient discharged from surgical standpoint. Return in one year for DM foot check.   No follow-ups on file.

## 2023-02-09 ENCOUNTER — Telehealth: Payer: Self-pay

## 2023-02-09 ENCOUNTER — Ambulatory Visit (INDEPENDENT_AMBULATORY_CARE_PROVIDER_SITE_OTHER): Payer: Medicare HMO

## 2023-02-09 VITALS — BP 138/86 | Ht 72.0 in | Wt 219.0 lb

## 2023-02-09 DIAGNOSIS — Z0000 Encounter for general adult medical examination without abnormal findings: Secondary | ICD-10-CM | POA: Diagnosis not present

## 2023-02-09 NOTE — Progress Notes (Signed)
I connected with  [name] on 02/09/23 by a audio enabled telemedicine application and verified that I am speaking with the correct person using two identifiers.  Patient Location: Home  Provider Location: Home Office  I discussed the limitations of evaluation and management by telemedicine. The patient expressed understanding and agreed to proceed.  Subjective:   Todd Zhang is a 77 y.o. male who presents for Medicare Annual/Subsequent preventive examination.  Review of Systems     Cardiac Risk Factors include: advanced age (>50men, >42 women);diabetes mellitus;dyslipidemia;hypertension;sedentary lifestyle;male gender     Objective:    Today's Vitals   02/09/23 0935 02/09/23 0937  BP: 138/86   Weight: 219 lb (99.3 kg)   Height: 6' (1.829 m)   PainSc:  7    Body mass index is 29.7 kg/m.     02/09/2023    9:48 AM 12/24/2022    1:39 PM 10/23/2022   12:31 PM 09/21/2022    3:17 PM 08/10/2022   11:34 AM 11/30/2021    9:21 AM 04/24/2021   10:44 AM  Advanced Directives  Does Patient Have a Medical Advance Directive? No No No No No No No  Would patient like information on creating a medical advance directive? No - Patient declined No - Patient declined No - Patient declined No - Patient declined No - Patient declined No - Patient declined No - Patient declined    Current Medications (verified) Outpatient Encounter Medications as of 02/09/2023  Medication Sig   alendronate (FOSAMAX) 70 MG tablet TAKE 1 TABLET BY MOUTH ONCE WEEKLY ON SUNDAY ON ON AN EMPTY STOMACH with full glass of water   atorvastatin (LIPITOR) 80 MG tablet Take 1 tablet (80 mg total) by mouth daily.   Blood Glucose Monitoring Suppl (GNP TRUE METRIX GLUCOSE METER) w/Device KIT    clopidogrel (PLAVIX) 75 MG tablet Take 1 tablet (75 mg total) by mouth daily.   ezetimibe (ZETIA) 10 MG tablet Take 1 tablet (10 mg total) by mouth daily.   fenofibrate (TRICOR) 145 MG tablet Take 1 tablet (145 mg total) by mouth  daily.   furosemide (LASIX) 40 MG tablet Take 1 tablet (40 mg total) by mouth 2 (two) times daily.   gabapentin (NEURONTIN) 300 MG capsule Take 1 capsule (300 mg total) by mouth 4 (four) times daily. TAKE 1 CAPSULE BY MOUTH THREE TIMES DAILY FOR PAIN   glucose blood (TRUE METRIX BLOOD GLUCOSE TEST) test strip CHECK BLOOD SUGAR FOUR TIMES DAILY Dx E11.40   insulin regular (HUMULIN R) 100 units/mL injection INJECT 10 UNITS INTO THE SKIN THREE TIMES DAILY BEFORE MEALS   Insulin Syringe-Needle U-100 (EASY TOUCH INSULIN SYRINGE) 31G X 5/16" 0.3 ML MISC USE WITH INSULIN EVERY DAY Dx E11.40   Lancets (UNILET MICRO-THIN 33G) MISC CHECK BLOOD SUGAR FOUR TIMES DAILY Dx E11.40   metFORMIN (GLUCOPHAGE) 500 MG tablet Take 1 tablet (500 mg total) by mouth 2 (two) times daily.   metoprolol tartrate (LOPRESSOR) 25 MG tablet Take 1 tablet (25 mg total) by mouth 2 (two) times daily.   nitroGLYCERIN (NITROSTAT) 0.4 MG SL tablet DISSOLVE 1 TABLET UNDER THE TONGUE EVERY 5 MINUTES AS NEEDED FOR CHEST PAIN. DO NOT EXCEED A TOTAL OF 3 DOSES IN 15 MINUTES. (Patient taking differently: Place 0.4 mg under the tongue every 5 (five) minutes as needed for chest pain.)   pantoprazole (PROTONIX) 40 MG tablet Take 1 tablet (40 mg total) by mouth daily.   tamsulosin (FLOMAX) 0.4 MG CAPS capsule  Take 1 capsule (0.4 mg total) by mouth daily.   No facility-administered encounter medications on file as of 02/09/2023.    Allergies (verified) Asa [aspirin], Penicillins, and Tetanus toxoid   History: Past Medical History:  Diagnosis Date   Atrial fibrillation (HCC)    Cervical spinal stenosis    DDD (degenerative disc disease), cervical    DDD (degenerative disc disease), lumbar    Diabetes mellitus without complication (HCC)    Dyspnea    Hyperlipidemia    Hypertension    Peripheral artery disease (HCC)    Past Surgical History:  Procedure Laterality Date   ABDOMINAL AORTOGRAM W/LOWER EXTREMITY N/A 04/30/2021   Procedure:  ABDOMINAL AORTOGRAM W/LOWER EXTREMITY;  Surgeon: Leonie Douglas, MD;  Location: [facility];  Service: Cardiovascular;  Laterality: N/A;   ABDOMINAL AORTOGRAM W/LOWER EXTREMITY N/A 10/25/2022   Procedure: ABDOMINAL AORTOGRAM W/LOWER EXTREMITY;  Surgeon: [name], MD;  Location: [facility];  Service: Cardiovascular;  Laterality: N/A;   AMPUTATION TOE     AMPUTATION TOE Left 05/04/2021   Procedure: LEFT GREAT TOE AMPUTATION;  Surgeon: [name], MD;  Location: [facility] OR;  Service: Vascular;  Laterality: Left;   AMPUTATION TOE Left 10/26/2022   Procedure: AMPUTATION LEFT SECOND TOE;  Surgeon: Louann Sjogren, DPM;  Location: [facility] OR;  Service: Podiatry;  Laterality: Left;   BYPASS GRAFT FEMORAL-PERONEAL Left 05/04/2021   Procedure: LEFT COMMON FEMORAL-TIBIOPERONEAL TRUNK BYPASS;  Surgeon: [name], MD;  Location: [facility] OR;  Service: Vascular;  Laterality: Left;   ENDARTERECTOMY FEMORAL Left 05/04/2021   Procedure: LEFT COMMON FEMORAL ENDARTERECTOMY WITH BOVINE PATCH;  Surgeon: [name], MD;  Location: [facility] OR;  Service: Vascular;  Laterality: Left;   ENDARTERECTOMY TIBIOPERONEAL Left 05/04/2021   Procedure: LEFT BELOW KNEE POPITEAL WITH  TIBIOPERONEAL ENDARTERECTOMY;  Surgeon: [name], MD;  Location: [facility] OR;  Service: Vascular;  Laterality: Left;   ENDOVEIN HARVEST OF GREATER SAPHENOUS VEIN  05/04/2021   Procedure: HARVEST OF LEFT GREATER SAPHENOUS VEIN;  Surgeon: [name], MD;  Location: [facility] OR;  Service: Vascular;;   INSERTION OF ILIAC STENT Left 05/04/2021   Procedure: LEFT COMMON ILLIAC ATERY ANGIOPLASTY WITH STENT;  Surgeon: [name], MD;  Location: [facility] OR;  Service: Vascular;  Laterality: Left;   INTRAOPERATIVE ARTERIOGRAM Left 05/04/2021   Procedure: LEFT ARTERY ARTERIOGRAM;  Surgeon: [name], MD;  Location: [facility] OR;  Service: Vascular;  Laterality: Left;   IRRIGATION AND DEBRIDEMENT FOOT Left 05/04/2021    Procedure: IRRIGATION AND DEBRIDEMENT FOOT;  Surgeon: [name], MD;  Location: [facility] OR;  Service: Vascular;  Laterality: Left;   KNEE SURGERY Left    NOSE SURGERY     PERIPHERAL VASCULAR INTERVENTION  10/25/2022   Procedure: PERIPHERAL VASCULAR INTERVENTION;  Surgeon: [name], MD;  Location: [facility];  Service: Cardiovascular;;   TEE WITHOUT CARDIOVERSION N/A 05/01/2021   Procedure: TRANSESOPHAGEAL ECHOCARDIOGRAM (TEE);  Surgeon: Chilton Si, MD;  Location: [facility] ENDOSCOPY;  Service: Cardiovascular;  Laterality: N/A;   Family History  Problem Relation Age of Onset   Stroke Mother    Heart disease Mother        CABG   Cancer Father    Social History   Socioeconomic History   Marital status: Divorced    Spouse name: Not on file   Number of children: 2   Years of education: 74   Highest education level: 11th grade  Occupational History  Occupation: Disabled  Tobacco Use   Smoking status: Former    Packs/day: 1    Types: Cigarettes    Start date: 06/19/1966    Quit date: 06/20/2011    Years since quitting: 11.6    Passive exposure: Past   Smokeless tobacco: Never  Vaping Use   Vaping Use: Never used  Substance and Sexual Activity   Alcohol use: No    Comment: quit drinking in 1982   Drug use: Not Currently    Comment: marijuana and cocaine in the past, quit in 2012   Sexual activity: Not Currently  Other Topics Concern   Not on file  Social History Narrative   Lives alone - ground level apartment   Social Determinants of Health   Financial Resource Strain: Low Risk  (02/09/2023)   Overall Financial Resource Strain (CARDIA)    Difficulty of Paying Living Expenses: Not hard at all  Food Insecurity: No Food Insecurity (02/09/2023)   Hunger Vital Sign    Worried About Running Out of Food in the Last Year: Never true    Ran Out of Food in the Last Year: Never true  Transportation Needs: No Transportation Needs (02/09/2023)   PRAPARE -  Administrator, Civil Service (Medical): No    Lack of Transportation (Non-Medical): No  Physical Activity: Inactive (02/09/2023)   Exercise Vital Sign    Days of Exercise per Week: 0 days    Minutes of Exercise per Session: 0 min  Stress: No Stress Concern Present (02/09/2023)   [employer] of Occupational Health - Occupational Stress Questionnaire    Feeling of Stress : Not at all  Social Connections: Socially Integrated (02/09/2023)   Social Connection and Isolation Panel [NHANES]    Frequency of Communication with Friends and Family: More than three times a week    Frequency of Social Gatherings with Friends and Family: More than three times a week    Attends Religious Services: More than 4 times per year    Active Member of [employer] or Organizations: Yes    Attends Engineer, structural: More than 4 times per year    Marital Status: Married  Recent Concern: Social Connections - Moderately Isolated (12/24/2022)   Social Connection and Isolation Panel [NHANES]    Frequency of Communication with Friends and Family: More than three times a week    Frequency of Social Gatherings with Friends and Family: More than three times a week    Attends Religious Services: More than 4 times per year    Active Member of [employer] or Organizations: No    Attends Engineer, structural: Never    Marital Status: Divorced    Tobacco Counseling Counseling given: Not Answered   Clinical Intake:  Pre-visit preparation completed: Yes  Pain : 0-10 Pain Score: 7  Pain Type: Chronic pain Pain Location: Knee (hips and legs) Pain Orientation:  (bilateral) Pain Descriptors / Indicators: Constant Pain Onset: More than a month ago Pain Frequency: Constant     BMI - recorded: 29.7 Nutritional Status: BMI 25 -29 Overweight Nutritional Risks: None Diabetes: Yes CBG done?: No Did pt. bring in CBG monitor from home?: No  How often do you need to have someone help you when  you read instructions, pamphlets, or other written materials from your doctor or pharmacy?: 1 - Never  Diabetic?Yes  Nutrition Risk Assessment:  Has the patient had any N/V/D within the last 2 months?  No  Does the patient have  any non-healing wounds?  No  Has the patient had any unintentional weight loss or weight gain?  No   Diabetes:  Is the patient diabetic?  Yes  If diabetic, was a CBG obtained today?  No  Did the patient bring in their glucometer from home?  No  How often do you monitor your CBG's? 3-4 times daily.   Financial Strains and Diabetes Management:  Are you having any financial strains with the device, your supplies or your medication? No .  Does the patient want to be seen by Chronic Care Management for management of their diabetes?  No  Would the patient like to be referred to a Nutritionist or for Diabetic Management?  No   Diabetic Exams:  Diabetic Eye Exam: Completed 07/27/2022 Diabetic Foot Exam: Overdue, Pt has been advised about the importance in completing this exam. Pt is scheduled for diabetic foot exam on 03/02/2023.   Interpreter Needed?: No  Information entered by :: Abby Adlai Nieblas, CMA   Activities of Daily Living    02/09/2023    9:46 AM 10/23/2022   12:31 PM  In your present state of health, do you have any difficulty performing the following activities:  Hearing? 1 0  Comment requesting a referral to an ENT   Vision? 1 0  Comment has an appt with eye doctor   Difficulty concentrating or making decisions? 0 0  Walking or climbing stairs? 1 0  Comment chronic pain   Dressing or bathing? 0 0  Doing errands, shopping? 0 0  Preparing Food and eating ? N   Using the Toilet? N   In the past six months, have you accidently leaked urine? N   Do you have problems with loss of bowel control? N   Managing your Medications? N   Managing your Finances? N   Housekeeping or managing your Housekeeping? N     Patient Care Team: [name],  FNP as PCP - General (Family Medicine) Marjo Bicker, MD as PCP - Cardiology (Cardiology) Valeria Batman, MD (Inactive) as Consulting Physician (Orthopedic Surgery) Adam Phenix, DPM as Consulting Physician (Podiatry) [name], MD as Consulting Physician (Vascular Surgery) Marcine Matar, MD as Consulting Physician (Urology) Audrie Gallus, RN as [facility] Care Management  Indicate any recent Medical Services you may have received from other than [facility] providers in the past year (date may be approximate).     Assessment:   This is a routine wellness examination for [place].  Hearing/Vision screen Hearing Screening - Comments:: Patient states he has a little difficulty hearing. Patient would like a referral to an ENT Vision Screening - Comments:: Wears rx glasses - up to date with routine eye exams with  Dr. Mayford Knife   Dietary issues and exercise activities discussed: Current Exercise Habits: The patient does not participate in regular exercise at present, Exercise limited by: cardiac condition(s);orthopedic condition(s)   Goals Addressed   None    Depression Screen    02/09/2023    9:44 AM 12/24/2022    1:34 PM 11/29/2022    1:44 PM 09/21/2022    3:13 PM 08/30/2022    2:21 PM 08/03/2022   10:29 AM 07/23/2022    2:30 PM  PHQ 2/9 Scores  PHQ - 2 Score 0 0 5 0 0 0 0  PHQ- 9 Score   14  0 0 3    Fall Risk    02/09/2023    9:41 AM 12/24/2022    1:37 PM  11/29/2022    1:45 PM 11/29/2022    1:44 PM 09/21/2022    3:15 PM  Fall Risk   Falls in the past year? 1 1 1  0 1  Number falls in past yr: 1 1 1  0 1  Injury with Fall? 1 0 1 0 1  Risk for fall due to : History of fall(s);Impaired balance/gait;Orthopedic patient History of fall(s)   History of fall(s);Impaired balance/gait;Impaired mobility  Follow up Education provided;Falls prevention discussed Falls evaluation completed;Falls prevention discussed   Falls evaluation completed;Education  provided;Falls prevention discussed    FALL RISK PREVENTION PERTAINING TO THE HOME:  Any stairs in or around the home? No  If so, are there any without handrails? No  Home free of loose throw rugs in walkways, pet beds, electrical cords, etc? Yes  Adequate lighting in your home to reduce risk of falls? Yes   ASSISTIVE DEVICES UTILIZED TO PREVENT FALLS:  Life alert? Yes  Use of a cane, walker or w/c? No  Grab bars in the bathroom? Yes  Shower chair or bench in shower? No  Elevated toilet seat or a handicapped toilet? No   TIMED UP AND GO:  Was the test performed? No .  Cognitive Function:    10/26/2017    2:56 PM  MMSE - Mini Mental State Exam  Orientation to time 5  Orientation to Place 4  Registration 3  Attention/ Calculation 2  Recall 0  Language- name 2 objects 2  Language- repeat 1  Language- follow 3 step command 3  Language- read & follow direction 1  Write a sentence 1  Copy design 1  Total score 23        02/09/2023    9:47 AM 11/30/2021    9:28 AM 11/27/2020    9:29 AM 11/27/2019   10:27 AM 11/22/2018   10:45 AM  6CIT Screen  What Year? 0 points 0 points 0 points  0 points  What month? 0 points 0 points 0 points  0 points  What time? 0 points 0 points 0 points 0 points 0 points  Count back from 20 0 points 0 points 0 points 0 points 0 points  Months in reverse 0 points 2 points 0 points 2 points 0 points  Repeat phrase 0 points 4 points 6 points 10 points 4 points  Total Score 0 points 6 points 6 points  4 points    Immunizations Immunization History  Administered Date(s) Administered   Fluad Quad(high Dose 65+) 06/18/2020   Influenza Split 10/01/2016   Influenza, High Dose Seasonal PF 05/31/2017, 04/26/2019   Influenza,inj,Quad PF,6+ Mos 06/19/2014   Influenza,inj,quad, With Preservative 05/23/2018   Moderna Sars-Covid-2 Vaccination 10/25/2019, 11/22/2019, 07/23/2020   Pneumococcal Conjugate-13 05/12/2015   Pneumococcal Polysaccharide-23  10/26/2017   Tdap 05/12/2015   Zoster, Live 05/20/2015    TDAP status: Up to date  Flu Vaccine status: Up to date  Pneumococcal vaccine status: Up to date  Covid-19 vaccine status: Information provided on how to obtain vaccines.   Qualifies for Shingles Vaccine? Yes   Zostavax completed No   Shingrix Completed?: No.    Education has been provided regarding the importance of this vaccine. Patient has been advised to call insurance company to determine out of pocket expense if they have not yet received this vaccine. Advised may also receive vaccine at local pharmacy or Health Dept. Verbalized acceptance and understanding.  Screening Tests Health Maintenance  Topic Date Due   Zoster Vaccines- Shingrix (1  of 2) Never done   COVID-19 Vaccine (4 - 2023-24 season) 05/14/2022   Colonoscopy  08/03/2022   INFLUENZA VACCINE  04/14/2023   HEMOGLOBIN A1C  06/02/2023   OPHTHALMOLOGY EXAM  07/28/2023   FOOT EXAM  08/04/2023   Diabetic kidney evaluation - eGFR measurement  11/30/2023   Diabetic kidney evaluation - Urine ACR  11/30/2023   Medicare Annual Wellness (AWV)  02/09/2024   DTaP/Tdap/Td (2 - Td or Tdap) 05/11/2025   Pneumonia Vaccine 79+ Years old  Completed   Hepatitis C Screening  Completed   HPV VACCINES  Aged Out    Health Maintenance  Health Maintenance Due  Topic Date Due   Zoster Vaccines- Shingrix (1 of 2) Never done   COVID-19 Vaccine (4 - 2023-24 season) 05/14/2022   Colonoscopy  08/03/2022    Colorectal cancer screening: No longer required.   Lung Cancer Screening: (Low Dose CT Chest recommended if Age 39-80 years, 30 pack-year currently smoking OR have quit w/in 15years.) does not qualify.   Additional Screening:  Hepatitis C Screening: does qualify; Completed 11/30/2022  Vision Screening: Recommended annual ophthalmology exams for early detection of glaucoma and other disorders of the eye. Is the patient up to date with their annual eye exam?  Yes  Who is  the provider or what is the name of the office in which the patient attends annual eye exams? [facility] If pt is not established with a provider, would they like to be referred to a provider to establish care? No .   Dental Screening: Recommended annual dental exams for proper oral hygiene  Community Resource Referral / Chronic Care Management: CRR required this visit?  No   CCM required this visit?  No      Plan:     I have personally reviewed and noted the following in the patient's chart:   Medical and social history Use of alcohol, tobacco or illicit drugs  Current medications and supplements including opioid prescriptions. Patient is not currently taking opioid prescriptions. Functional ability and status Nutritional status Physical activity Advanced directives List of other physicians Hospitalizations, surgeries, and ER visits in previous 12 months Vitals Screenings to include cognitive, depression, and falls Referrals and appointments  In addition, I have reviewed and discussed with patient certain preventive protocols, quality metrics, and best practice recommendations. A written personalized care plan for preventive services as well as general preventive health recommendations were provided to patient.   Due to this being a telephonic visit, the after visit summary with patients personalized plan was offered to patient via mail or my-chart. Per request, patient was mailed a copy of their AVS.   Jordan Hawks Shadrack Brummitt, CMA   02/09/2023   Nurse Notes: PATIENT REQUESTING A REFERRAL TO AN ENT TO BE EVALUATED FOR HEARING AIDS

## 2023-02-09 NOTE — Telephone Encounter (Signed)
During patients AWVS he complained of hearing difficulties. Would like a referral to an ENT to evaluate for hearing aids. He would prefer local.   Abby Sheron Tallman, CMA  [facility] AWV Team Direct Dial: [phone]

## 2023-02-09 NOTE — Patient Instructions (Signed)
[name] , Thank you for taking time to come for your Medicare Wellness Visit. I appreciate your ongoing commitment to your health goals. Please review the following plan we discussed and let me know if I can assist you in the future.   These are the goals we discussed:  Goals      AWV     11/27/2020 AWV Goal: Fall Prevention  Over the next year, patient will decrease their risk for falls by: Using assistive devices, such as a cane or walker, as needed Identifying fall risks within their home and correcting them by: Removing throw rugs Adding handrails to stairs or ramps Removing clutter and keeping a clear pathway throughout the home Increasing light, especially at night Adding shower handles/bars Raising toilet seat Identifying potential personal risk factors for falls: Medication side effects Incontinence/urgency Vestibular dysfunction Hearing loss Musculoskeletal disorders Neurological disorders Orthostatic hypotension       CCM (DIABETES) EXPECTED OUTCOME: MONITOR, SELF-MANAGE AND REDUCE SYMPTOMS OF DIABETES     Current Barriers:  Knowledge Deficits related to Diabetes management Chronic Disease Management support and education needs related to Diabetes, diet No Advanced Directives in place- pt declines Patient reports he lives alone, can call on his sister [name] if needed, pt states he still drives, is overall independent with his care, has cane and walker on hand but only uses if needed, pt states he has had several falls in the past year. Patient reports he checks CBG "several times per day"  fasting ranges in 100's, none above 200, random ranges 120-140 with all under 300.  Planned Interventions: Provided education to patient about basic DM disease process; Reviewed medications with patient and discussed importance of medication adherence;        Reviewed prescribed diet with patient carbohydrate modified; Counseled on importance of regular laboratory  monitoring as prescribed;        Discussed plans with patient for ongoing care management follow up and provided patient with direct contact information for care management team;      Provided patient with written educational materials related to hypo and hyperglycemia and importance of correct treatment;       Reviewed scheduled/upcoming provider appointments including: primary care provider;         Advised patient, providing education and rationale, to check cbg per MD order  and record        call provider for findings outside established parameters;       Review of patient status, including review of consultants reports, relevant laboratory and other test results, and medications completed;       Screening for signs and symptoms of depression related to chronic disease state;        Assessed social determinant of health barriers;        Reviewed safety precautions  Symptom Management: Take medications as prescribed   Attend all scheduled provider appointments Call pharmacy for medication refills 3-7 days in advance of running out of medications Attend church or other social activities Perform all self care activities independently  Perform IADL's (shopping, preparing meals, housekeeping, managing finances) independently Call provider office for new concerns or questions  check blood sugar at prescribed times: per doctor's order  check feet daily for cuts, sores or redness enter blood sugar readings and medication or insulin into daily log take the blood sugar log to all doctor visits take the blood sugar meter to all doctor visits set goal weight trim toenails straight across fill half of plate  with vegetables limit fast food meals to no more than 1 per week prepare main meal at home 3 to 5 days each week read food labels for fat, fiber, carbohydrates and portion size wash and dry feet carefully every day Look over education mailed- hypoglycemia fall prevention strategies: change  position slowly, use assistive device such as walker or cane (per provider recommendations) when walking, keep walkways clear, have good lighting in room. It is important to contact your provider if you have any falls, maintain muscle strength/tone by exercise per provider recommendations.  Follow Up Plan: Telephone follow up appointment with care management team member scheduled for:  03/10/23 at 3 pm       CCM (HYPERTENSION) EXPECTED OUTCOME: MONITOR, SELF-MANAGE AND REDUCE SYMPTOMS OF HYPERTENSION     Current Barriers:  Knowledge Deficits related to Hypertension management Chronic Disease Management support and education needs related to Hypertension, diet No Advanced Directives in place- pt declines Patient reports he checks blood pressure several times per week and "readings are alright" Patient reports he has all medications on hand and taking as prescribed, receives prefilled medications from [facility]  Planned Interventions: Evaluation of current treatment plan related to hypertension self management and patient's adherence to plan as established by provider;   Reviewed prescribed diet low sodium Reviewed medications with patient and discussed importance of compliance;  Counseled on the importance of exercise goals with target of 150 minutes per week Discussed plans with patient for ongoing care management follow up and provided patient with direct contact information for care management team; Advised patient, providing education and rationale, to monitor blood pressure daily and record, calling PCP for findings outside established parameters;  Provided education on prescribed diet low sodium;  Discussed complications of poorly controlled blood pressure such as heart disease, stroke, circulatory complications, vision complications, kidney impairment, sexual dysfunction;  Screening for signs and symptoms of depression related to chronic disease state;  Assessed social determinant of  health barriers;   Symptom Management: Take medications as prescribed   Attend all scheduled provider appointments Call pharmacy for medication refills 3-7 days in advance of running out of medications Attend church or other social activities Perform all self care activities independently  Perform IADL's (shopping, preparing meals, housekeeping, managing finances) independently Call provider office for new concerns or questions  check blood pressure 3 times per week choose a place to take my blood pressure (home, clinic or office, retail store) write blood pressure results in a log or diary learn about high blood pressure keep a blood pressure log take blood pressure log to all doctor appointments call doctor for signs and symptoms of high blood pressure take medications for blood pressure exactly as prescribed begin an exercise program report new symptoms to your doctor eat more whole grains, fruits and vegetables, lean meats and healthy fats Look over education mailed- low sodium diet  Follow Up Plan: Telephone follow up appointment with care management team member scheduled for:  03/10/23 at 3 pm       DIET - REDUCE SUGAR INTAKE     Continue to watch carbohydrate intake by avoiding baked goods and candy as much as possible.      Manage My Emotions     Protect My Health and manage housing needs of client; client to talk with [facility] nursing staff about breathing challenges of client. Manage mobility needs.     Timeframe:  Short-Term Goal Priority:  Medium Progress:  On Track Start Date:  11/19/21                 Expected End Date:       06/06/22                 Follow Up Date  05/05/22 at 10:00 AM   Protect My Health (Patient)  Manage housing needs of client ; client to talk with [facility] nursing staff about breathing challenges of client. Manage mobility needs   Why is this important?   Screening tests can find diseases early when they are easier to treat.  Your doctor or  nurse will talk with you about which tests are important for you.  Getting shots for common diseases like the flu and shingles will help prevent them.     Patient Coping Skills: Attends scheduled medical appointments No transport needs Completes ADLs daily  Patient deficits:   Mobility issues Housing needs  Patient Goals:   Patient will call LCSW as needed in next 30 days for CCM support Patient will attend all scheduled client medical appointments in next 30 days Patient will communicate regularly with his brother, Riley Lam, in next 30 days to discuss ongoing needs of client -  Follow Up Plan: LCSW to call client on 05/05/22 at 10:00 AM          This is a list of the screening recommended for you and due dates:  Health Maintenance  Topic Date Due   Zoster (Shingles) Vaccine (1 of 2) Never done   COVID-19 Vaccine (4 - 2023-24 season) 05/14/2022   Colon Cancer Screening  08/03/2022   Flu Shot  04/14/2023   Hemoglobin A1C  06/02/2023   Eye exam for diabetics  07/28/2023   Complete foot exam   08/04/2023   Yearly kidney function blood test for diabetes  11/30/2023   Yearly kidney health urinalysis for diabetes  11/30/2023   Medicare Annual Wellness Visit  02/09/2024   DTaP/Tdap/Td vaccine (2 - Td or Tdap) 05/11/2025   Pneumonia Vaccine  Completed   Hepatitis C Screening  Completed   HPV Vaccine  Aged Out    Advanced directives: Advance directive discussed with you today. Even though you declined this today, please call our office should you change your mind, and we can give you the proper paperwork for you to fill out. Advance care planning is a way to make decisions about medical care that fits your values in case you are ever unable to make these decisions for yourself.   Conditions/risks identified: A message has been sent to your provider requesting a referral to an  ear, nose, and throat doctor because you complain of hearing difficulties  Next appointment: Follow up  in one year for your annual wellness visit. 02/22/2024 at 10:30 via telephone  Preventive Care 65 Years and Older, Male  Preventive care refers to lifestyle choices and visits with your health care provider that can promote health and wellness. What does preventive care include? A yearly physical exam. This is also called an annual well check. Dental exams once or twice a year. Routine eye exams. Ask your health care provider how often you should have your eyes checked. Personal lifestyle choices, including: Daily care of your teeth and gums. Regular physical activity. Eating a healthy diet. Avoiding tobacco and drug use. Limiting alcohol use. Practicing safe sex. Taking low doses of aspirin every day. Taking vitamin and mineral supplements as recommended by your health care provider. What happens during an annual well check? The  services and screenings done by your health care provider during your annual well check will depend on your age, overall health, lifestyle risk factors, and family history of disease. Counseling  Your health care provider may ask you questions about your: Alcohol use. Tobacco use. Drug use. Emotional well-being. Home and relationship well-being. Sexual activity. Eating habits. History of falls. Memory and ability to understand (cognition). Work and work Astronomer. Screening  You may have the following tests or measurements: Height, weight, and BMI. Blood pressure. Lipid and cholesterol levels. These may be checked every 5 years, or more frequently if you are over 13 years old. Skin check. Lung cancer screening. You may have this screening every year starting at age 47 if you have a 30-pack-year history of smoking and currently smoke or have quit within the past 15 years. Fecal occult blood test (FOBT) of the stool. You may have this test every year starting at age 65. Flexible sigmoidoscopy or colonoscopy. You may have a sigmoidoscopy every 5 years  or a colonoscopy every 10 years starting at age 83. Prostate cancer screening. Recommendations will vary depending on your family history and other risks. Hepatitis C blood test. Hepatitis B blood test. Sexually transmitted disease (STD) testing. Diabetes screening. This is done by checking your blood sugar (glucose) after you have not eaten for a while (fasting). You may have this done every 1-3 years. Abdominal aortic aneurysm (AAA) screening. You may need this if you are a current or former smoker. Osteoporosis. You may be screened starting at age 69 if you are at high risk. Talk with your health care provider about your test results, treatment options, and if necessary, the need for more tests. Vaccines  Your health care provider may recommend certain vaccines, such as: Influenza vaccine. This is recommended every year. Tetanus, diphtheria, and acellular pertussis (Tdap, Td) vaccine. You may need a Td booster every 10 years. Zoster vaccine. You may need this after age 46. Pneumococcal 13-valent conjugate (PCV13) vaccine. One dose is recommended after age 12. Pneumococcal polysaccharide (PPSV23) vaccine. One dose is recommended after age 18. Talk to your health care provider about which screenings and vaccines you need and how often you need them. This information is not intended to replace advice given to you by your health care provider. Make sure you discuss any questions you have with your health care provider. Document Released: 09/26/2015 Document Revised: 05/19/2016 Document Reviewed: 07/01/2015 Elsevier Interactive Patient Education  2017 [employer].  Fall Prevention in the Home Falls can cause injuries. They can happen to people of all ages. There are many things you can do to make your home safe and to help prevent falls. What can I do on the outside of my home? Regularly fix the edges of walkways and driveways and fix any cracks. Remove anything that might make you trip as  you walk through a door, such as a raised step or threshold. Trim any bushes or trees on the path to your home. Use bright outdoor lighting. Clear any walking paths of anything that might make someone trip, such as rocks or tools. Regularly check to see if handrails are loose or broken. Make sure that both sides of any steps have handrails. Any raised decks and porches should have guardrails on the edges. Have any leaves, snow, or ice cleared regularly. Use sand or salt on walking paths during winter. Clean up any spills in your garage right away. This includes oil or grease spills. What can I do  in the bathroom? Use night lights. Install grab bars by the toilet and in the tub and shower. Do not use towel bars as grab bars. Use non-skid mats or decals in the tub or shower. If you need to sit down in the shower, use a plastic, non-slip stool. Keep the floor dry. Clean up any water that spills on the floor as soon as it happens. Remove soap buildup in the tub or shower regularly. Attach bath mats securely with double-sided non-slip rug tape. Do not have throw rugs and other things on the floor that can make you trip. What can I do in the bedroom? Use night lights. Make sure that you have a light by your bed that is easy to reach. Do not use any sheets or blankets that are too big for your bed. They should not hang down onto the floor. Have a firm chair that has side arms. You can use this for support while you get dressed. Do not have throw rugs and other things on the floor that can make you trip. What can I do in the kitchen? Clean up any spills right away. Avoid walking on wet floors. Keep items that you use a lot in easy-to-reach places. If you need to reach something above you, use a strong step stool that has a grab bar. Keep electrical cords out of the way. Do not use floor polish or wax that makes floors slippery. If you must use wax, use non-skid floor wax. Do not have throw  rugs and other things on the floor that can make you trip. What can I do with my stairs? Do not leave any items on the stairs. Make sure that there are handrails on both sides of the stairs and use them. Fix handrails that are broken or loose. Make sure that handrails are as long as the stairways. Check any carpeting to make sure that it is firmly attached to the stairs. Fix any carpet that is loose or worn. Avoid having throw rugs at the top or bottom of the stairs. If you do have throw rugs, attach them to the floor with carpet tape. Make sure that you have a light switch at the top of the stairs and the bottom of the stairs. If you do not have them, ask someone to add them for you. What else can I do to help prevent falls? Wear shoes that: Do not have high heels. Have rubber bottoms. Are comfortable and fit you well. Are closed at the toe. Do not wear sandals. If you use a stepladder: Make sure that it is fully opened. Do not climb a closed stepladder. Make sure that both sides of the stepladder are locked into place. Ask someone to hold it for you, if possible. Clearly mark and make sure that you can see: Any grab bars or handrails. First and last steps. Where the edge of each step is. Use tools that help you move around (mobility aids) if they are needed. These include: Canes. Walkers. Scooters. Crutches. Turn on the lights when you go into a dark area. Replace any light bulbs as soon as they burn out. Set up your furniture so you have a clear path. Avoid moving your furniture around. If any of your floors are uneven, fix them. If there are any pets around you, be aware of where they are. Review your medicines with your doctor. Some medicines can make you feel dizzy. This can increase your chance of falling. Ask your doctor  what other things that you can do to help prevent falls. This information is not intended to replace advice given to you by your health care provider. Make  sure you discuss any questions you have with your health care provider. Document Released: 06/26/2009 Document Revised: 02/05/2016 Document Reviewed: 10/04/2014 Elsevier Interactive Patient Education  2017 [employer].

## 2023-02-10 ENCOUNTER — Other Ambulatory Visit: Payer: Self-pay | Admitting: Family Medicine

## 2023-02-10 ENCOUNTER — Other Ambulatory Visit: Payer: Self-pay

## 2023-02-10 DIAGNOSIS — H9190 Unspecified hearing loss, unspecified ear: Secondary | ICD-10-CM

## 2023-02-13 ENCOUNTER — Other Ambulatory Visit: Payer: Self-pay | Admitting: Nurse Practitioner

## 2023-02-13 DIAGNOSIS — N401 Enlarged prostate with lower urinary tract symptoms: Secondary | ICD-10-CM

## 2023-02-13 DIAGNOSIS — M5030 Other cervical disc degeneration, unspecified cervical region: Secondary | ICD-10-CM

## 2023-02-13 DIAGNOSIS — E1140 Type 2 diabetes mellitus with diabetic neuropathy, unspecified: Secondary | ICD-10-CM

## 2023-02-13 DIAGNOSIS — G8929 Other chronic pain: Secondary | ICD-10-CM

## 2023-02-14 ENCOUNTER — Other Ambulatory Visit: Payer: Self-pay

## 2023-02-14 DIAGNOSIS — H9190 Unspecified hearing loss, unspecified ear: Secondary | ICD-10-CM

## 2023-02-15 ENCOUNTER — Other Ambulatory Visit: Payer: Self-pay | Admitting: Family Medicine

## 2023-02-15 DIAGNOSIS — E1140 Type 2 diabetes mellitus with diabetic neuropathy, unspecified: Secondary | ICD-10-CM

## 2023-02-15 DIAGNOSIS — M5030 Other cervical disc degeneration, unspecified cervical region: Secondary | ICD-10-CM

## 2023-02-15 DIAGNOSIS — N401 Enlarged prostate with lower urinary tract symptoms: Secondary | ICD-10-CM

## 2023-02-15 DIAGNOSIS — M545 Low back pain, unspecified: Secondary | ICD-10-CM

## 2023-02-17 ENCOUNTER — Ambulatory Visit: Payer: Medicare HMO | Admitting: Podiatry

## 2023-02-17 DIAGNOSIS — Z899 Acquired absence of limb, unspecified: Secondary | ICD-10-CM

## 2023-02-17 DIAGNOSIS — M2041 Other hammer toe(s) (acquired), right foot: Secondary | ICD-10-CM

## 2023-02-17 DIAGNOSIS — E1140 Type 2 diabetes mellitus with diabetic neuropathy, unspecified: Secondary | ICD-10-CM

## 2023-02-17 NOTE — Progress Notes (Signed)
Patient presents today to be measured for diabetic shoes and insoles.  Patient was measured for 1 pair of diabetic shoes and 3 pairs of foam casted diabetic insoles.   Wt 216 Shoe size 10 xw Shoe type 1. lt953m                    2. lt63m Treating physician gloria zarwolo   Re-appointment for regularly scheduled diabetic foot care visits or if they should experience any trouble with the shoes or insoles.

## 2023-03-02 ENCOUNTER — Encounter: Payer: Self-pay | Admitting: Family Medicine

## 2023-03-02 ENCOUNTER — Ambulatory Visit (INDEPENDENT_AMBULATORY_CARE_PROVIDER_SITE_OTHER): Payer: Medicare HMO | Admitting: Family Medicine

## 2023-03-02 VITALS — BP 129/73 | HR 80 | Ht 72.0 in | Wt 220.0 lb

## 2023-03-02 DIAGNOSIS — Z794 Long term (current) use of insulin: Secondary | ICD-10-CM | POA: Diagnosis not present

## 2023-03-02 DIAGNOSIS — E038 Other specified hypothyroidism: Secondary | ICD-10-CM

## 2023-03-02 DIAGNOSIS — R091 Pleurisy: Secondary | ICD-10-CM

## 2023-03-02 DIAGNOSIS — E1140 Type 2 diabetes mellitus with diabetic neuropathy, unspecified: Secondary | ICD-10-CM

## 2023-03-02 DIAGNOSIS — E559 Vitamin D deficiency, unspecified: Secondary | ICD-10-CM

## 2023-03-02 DIAGNOSIS — E1169 Type 2 diabetes mellitus with other specified complication: Secondary | ICD-10-CM | POA: Diagnosis not present

## 2023-03-02 DIAGNOSIS — E785 Hyperlipidemia, unspecified: Secondary | ICD-10-CM

## 2023-03-02 NOTE — Assessment & Plan Note (Signed)
Complains of left-sided chest wall pain  Onset of symptoms 3 days ago Reports coughing once in a while Complains of fever 3 days ago Pain is described as sharp and stabbing Pain is reported with deep breathing reports pain reports pain is constant No recent injury or trauma to the left chest wall No recent symptoms of upper respiratory infection Will get x-ray to rule out cardiovascular abnormalities and pneumonia Encouraged to continue taking gabapentin and Protonix as prescribed Symptom likely of costochondritis Encouraged supportive care with heat application to the affected site, rest, and over-the-counter Tylenol as needed for pain control

## 2023-03-02 NOTE — Patient Instructions (Signed)
I appreciate the opportunity to provide care to you today!    Follow up:  3 months  Labs: please stop by the lab during the week to get your blood drawn (CBC, CMP, TSH, Lipid profile, HgA1c, Vit D)  Please continue following up with the podiatrist. Your diabetic foot exam showed little to no sensation in your feet   Please refer to the attached clinical reference on how to care for your feet  Please continue to a heart-healthy diet and increase your physical activities. Try to exercise for at least five days a week.      It was a pleasure to see you and I look forward to continuing to work together on your health and well-being. Please do not hesitate to call the office if you need care or have questions about your care.   Have a wonderful day and week. With Gratitude, [name] MSN, FNP-BC

## 2023-03-02 NOTE — Assessment & Plan Note (Signed)
Diabetic foot examination performed with minimal sensation in the feet The patient is following up with podiatry Reviewed diabetic footcare and encouraged to reference the clinical reference attached to his AVS if any questions Pending hemoglobin A1c today No reports of polyuria, polydipsia, and polyphagia Encouraged avoidance of high sugar foods and beverages Lab Results  Component Value Date   HGBA1C 8.6 (H) 11/30/2022

## 2023-03-02 NOTE — Progress Notes (Signed)
Established Patient Office Visit  Subjective:  Patient ID: Todd Zhang, male    DOB: [birth year]  Age: 77 y.o. MRN: [id number]  CC:  Chief Complaint  Patient presents with   Chronic Care Management    3 month f/u , pt reports side pain on his left side since 02/27/23    HPI Todd Zhang is a 77 y.o. male with past medical history of type 2 diabetes, A-fib, peripheral arterial disease, and hyperlipidemia presents for f/u of  chronic medical conditions. For the details of today's visit, please refer to the assessment and plan.     Past Medical History:  Diagnosis Date   Atrial fibrillation (HCC)    Cervical spinal stenosis    DDD (degenerative disc disease), cervical    DDD (degenerative disc disease), lumbar    Diabetes mellitus without complication (HCC)    Dyspnea    Hyperlipidemia    Hypertension    Peripheral artery disease (HCC)     Past Surgical History:  Procedure Laterality Date   ABDOMINAL AORTOGRAM W/LOWER EXTREMITY N/A 04/30/2021   Procedure: ABDOMINAL AORTOGRAM W/LOWER EXTREMITY;  Surgeon: Leonie Douglas, MD;  Location: [facility];  Service: Cardiovascular;  Laterality: N/A;   ABDOMINAL AORTOGRAM W/LOWER EXTREMITY N/A 10/25/2022   Procedure: ABDOMINAL AORTOGRAM W/LOWER EXTREMITY;  Surgeon: [name], MD;  Location: [facility];  Service: Cardiovascular;  Laterality: N/A;   AMPUTATION TOE     AMPUTATION TOE Left 05/04/2021   Procedure: LEFT GREAT TOE AMPUTATION;  Surgeon: [name], MD;  Location: [facility] OR;  Service: Vascular;  Laterality: Left;   AMPUTATION TOE Left 10/26/2022   Procedure: AMPUTATION LEFT SECOND TOE;  Surgeon: Louann Sjogren, DPM;  Location: [facility] OR;  Service: Podiatry;  Laterality: Left;   BYPASS GRAFT FEMORAL-PERONEAL Left 05/04/2021   Procedure: LEFT COMMON FEMORAL-TIBIOPERONEAL TRUNK BYPASS;  Surgeon: [name], MD;  Location: [facility] OR;  Service: Vascular;  Laterality: Left;   ENDARTERECTOMY FEMORAL  Left 05/04/2021   Procedure: LEFT COMMON FEMORAL ENDARTERECTOMY WITH BOVINE PATCH;  Surgeon: [name], MD;  Location: [facility] OR;  Service: Vascular;  Laterality: Left;   ENDARTERECTOMY TIBIOPERONEAL Left 05/04/2021   Procedure: LEFT BELOW KNEE POPITEAL WITH  TIBIOPERONEAL ENDARTERECTOMY;  Surgeon: [name], MD;  Location: [facility] OR;  Service: Vascular;  Laterality: Left;   ENDOVEIN HARVEST OF GREATER SAPHENOUS VEIN  05/04/2021   Procedure: HARVEST OF LEFT GREATER SAPHENOUS VEIN;  Surgeon: [name], MD;  Location: [facility] OR;  Service: Vascular;;   INSERTION OF ILIAC STENT Left 05/04/2021   Procedure: LEFT COMMON ILLIAC ATERY ANGIOPLASTY WITH STENT;  Surgeon: [name], MD;  Location: [facility] OR;  Service: Vascular;  Laterality: Left;   INTRAOPERATIVE ARTERIOGRAM Left 05/04/2021   Procedure: LEFT ARTERY ARTERIOGRAM;  Surgeon: [name], MD;  Location: [facility] OR;  Service: Vascular;  Laterality: Left;   IRRIGATION AND DEBRIDEMENT FOOT Left 05/04/2021   Procedure: IRRIGATION AND DEBRIDEMENT FOOT;  Surgeon: [name], MD;  Location: [facility] OR;  Service: Vascular;  Laterality: Left;   KNEE SURGERY Left    NOSE SURGERY     PERIPHERAL VASCULAR INTERVENTION  10/25/2022   Procedure: PERIPHERAL VASCULAR INTERVENTION;  Surgeon: [name], MD;  Location: [facility];  Service: Cardiovascular;;   TEE WITHOUT CARDIOVERSION N/A 05/01/2021   Procedure: TRANSESOPHAGEAL ECHOCARDIOGRAM (TEE);  Surgeon: Chilton Si, MD;  Location: [facility] ENDOSCOPY;  Service: Cardiovascular;  Laterality: N/A;  Family History  Problem Relation Age of Onset   Stroke Mother    Heart disease Mother        CABG   Cancer Father     Social History   Socioeconomic History   Marital status: Divorced    Spouse name: Not on file   Number of children: 2   Years of education: 25   Highest education level: 11th grade  Occupational History   Occupation: Disabled  Tobacco Use    Smoking status: Former    Packs/day: 1    Types: Cigarettes    Start date: 06/19/1966    Quit date: 06/20/2011    Years since quitting: 11.7    Passive exposure: Past   Smokeless tobacco: Never  Vaping Use   Vaping Use: Never used  Substance and Sexual Activity   Alcohol use: No    Comment: quit drinking in 1982   Drug use: Not Currently    Comment: marijuana and cocaine in the past, quit in 2012   Sexual activity: Not Currently  Other Topics Concern   Not on file  Social History Narrative   Lives alone - ground level apartment   Social Determinants of Health   Financial Resource Strain: Low Risk  (02/09/2023)   Overall Financial Resource Strain (CARDIA)    Difficulty of Paying Living Expenses: Not hard at all  Food Insecurity: No Food Insecurity (02/09/2023)   Hunger Vital Sign    Worried About Running Out of Food in the Last Year: Never true    Ran Out of Food in the Last Year: Never true  Transportation Needs: No Transportation Needs (02/09/2023)   PRAPARE - Administrator, Civil Service (Medical): No    Lack of Transportation (Non-Medical): No  Physical Activity: Inactive (02/09/2023)   Exercise Vital Sign    Days of Exercise per Week: 0 days    Minutes of Exercise per Session: 0 min  Stress: No Stress Concern Present (02/09/2023)   [employer] of Occupational Health - Occupational Stress Questionnaire    Feeling of Stress : Not at all  Social Connections: Socially Integrated (02/09/2023)   Social Connection and Isolation Panel [NHANES]    Frequency of Communication with Friends and Family: More than three times a week    Frequency of Social Gatherings with Friends and Family: More than three times a week    Attends Religious Services: More than 4 times per year    Active Member of [employer] or Organizations: Yes    Attends Engineer, structural: More than 4 times per year    Marital Status: Married  Recent Concern: Social Connections - Moderately  Isolated (12/24/2022)   Social Connection and Isolation Panel [NHANES]    Frequency of Communication with Friends and Family: More than three times a week    Frequency of Social Gatherings with Friends and Family: More than three times a week    Attends Religious Services: More than 4 times per year    Active Member of [employer] or Organizations: No    Attends Banker Meetings: Never    Marital Status: Divorced  Catering manager Violence: Not At Risk (02/09/2023)   Humiliation, Afraid, Rape, and Kick questionnaire    Fear of Current or Ex-Partner: No    Emotionally Abused: No    Physically Abused: No    Sexually Abused: No    Outpatient Medications Prior to Visit  Medication Sig Dispense Refill   atorvastatin (LIPITOR) 80 MG  tablet Take 1 tablet (80 mg total) by mouth daily. 90 tablet 3   Blood Glucose Monitoring Suppl (GNP TRUE METRIX GLUCOSE METER) w/Device KIT      clopidogrel (PLAVIX) 75 MG tablet Take 1 tablet (75 mg total) by mouth daily. 90 tablet 1   ezetimibe (ZETIA) 10 MG tablet Take 1 tablet (10 mg total) by mouth daily. 90 tablet 3   fenofibrate (TRICOR) 145 MG tablet Take 1 tablet (145 mg total) by mouth daily. 90 tablet 1   furosemide (LASIX) 40 MG tablet Take 1 tablet (40 mg total) by mouth 2 (two) times daily. 60 tablet 3   gabapentin (NEURONTIN) 300 MG capsule TAKE ONE CAPSULE BY MOUTH FOUR TIMES DAILY 120 capsule 5   glucose blood (TRUE METRIX BLOOD GLUCOSE TEST) test strip CHECK BLOOD SUGAR FOUR TIMES DAILY Dx E11.40 400 each 3   insulin regular (HUMULIN R) 100 units/mL injection INJECT 10 UNITS INTO THE SKIN THREE TIMES DAILY BEFORE MEALS 30 mL 1   Insulin Syringe-Needle U-100 (EASY TOUCH INSULIN SYRINGE) 31G X 5/16" 0.3 ML MISC USE WITH INSULIN EVERY DAY Dx E11.40 100 each 3   Lancets (UNILET MICRO-THIN 33G) MISC CHECK BLOOD SUGAR FOUR TIMES DAILY Dx E11.40 400 each 3   metFORMIN (GLUCOPHAGE) 500 MG tablet Take 1 tablet (500 mg total) by mouth 2 (two) times  daily. 180 tablet 0   metoprolol tartrate (LOPRESSOR) 25 MG tablet Take 1 tablet (25 mg total) by mouth 2 (two) times daily. 180 tablet 3   pantoprazole (PROTONIX) 40 MG tablet Take 1 tablet (40 mg total) by mouth daily. 90 tablet 1   tamsulosin (FLOMAX) 0.4 MG CAPS capsule TAKE ONE CAPSULE BY MOUTH DAILY 90 capsule 5   alendronate (FOSAMAX) 70 MG tablet TAKE 1 TABLET BY MOUTH ONCE WEEKLY ON SUNDAY ON ON AN EMPTY STOMACH with full glass of water 12 tablet 0   nitroGLYCERIN (NITROSTAT) 0.4 MG SL tablet DISSOLVE 1 TABLET UNDER THE TONGUE EVERY 5 MINUTES AS NEEDED FOR CHEST PAIN. DO NOT EXCEED A TOTAL OF 3 DOSES IN 15 MINUTES. (Patient taking differently: Place 0.4 mg under the tongue every 5 (five) minutes as needed for chest pain.) 25 tablet 2   No facility-administered medications prior to visit.    Allergies  Allergen Reactions   Asa [Aspirin] Hives   Penicillins Hives    Hives + vomiting as an adult  - tolerated Ceftriaxone 04/2021   Tetanus Toxoid     unknown    ROS Review of Systems  Constitutional:  Negative for chills, fatigue and fever.  Eyes:  Negative for visual disturbance.  Respiratory:  Negative for chest tightness and shortness of breath.   Cardiovascular:  Negative for chest pain and palpitations.  Neurological:  Negative for dizziness and headaches.      Objective:    Physical Exam HENT:     Head: Normocephalic.     Right Ear: External ear normal.     Left Ear: External ear normal.     Nose: No congestion or rhinorrhea.     Mouth/Throat:     Mouth: Mucous membranes are moist.  Cardiovascular:     Rate and Rhythm: Regular rhythm.     Heart sounds: No murmur heard. Pulmonary:     Effort: No respiratory distress.     Breath sounds: Normal breath sounds.  Musculoskeletal:       Feet:  Feet:     Comments: The marked areas are amputated Neurological:  Mental Status: He is alert.     BP 129/73   Pulse 80   Ht 6' (1.829 m)   Wt 220 lb (99.8 kg)    SpO2 93%   BMI 29.84 kg/m  Wt Readings from Last 3 Encounters:  03/02/23 220 lb (99.8 kg)  02/09/23 219 lb (99.3 kg)  12/07/22 217 lb 8 oz (98.7 kg)    Lab Results  Component Value Date   TSH 2.910 11/30/2022   Lab Results  Component Value Date   WBC 8.9 11/30/2022   HGB 14.1 11/30/2022   HCT 41.9 11/30/2022   MCV 91 11/30/2022   PLT 265 11/30/2022   Lab Results  Component Value Date   NA 138 11/30/2022   K 4.5 11/30/2022   CO2 25 11/30/2022   GLUCOSE 206 (H) 11/30/2022   BUN 45 (H) 11/30/2022   CREATININE 2.05 (H) 11/30/2022   BILITOT 0.4 11/30/2022   ALKPHOS 62 11/30/2022   AST 21 11/30/2022   ALT 21 11/30/2022   PROT 7.5 11/30/2022   ALBUMIN 4.6 11/30/2022   CALCIUM 9.9 11/30/2022   ANIONGAP 11 10/28/2022   EGFR 33 (L) 11/30/2022   Lab Results  Component Value Date   CHOL 229 (H) 11/30/2022   Lab Results  Component Value Date   HDL 50 11/30/2022   Lab Results  Component Value Date   LDLCALC 132 (H) 11/30/2022   Lab Results  Component Value Date   TRIG 267 (H) 11/30/2022   Lab Results  Component Value Date   CHOLHDL 4.6 11/30/2022   Lab Results  Component Value Date   HGBA1C 8.6 (H) 11/30/2022      Assessment & Plan:  Type 2 diabetes mellitus with diabetic neuropathy, with long-term current use of insulin (HCC) Assessment & Plan: Diabetic foot examination performed with minimal sensation in the feet The patient is following up with podiatry Reviewed diabetic footcare and encouraged to reference the clinical reference attached to his AVS if any questions Pending hemoglobin A1c today No reports of polyuria, polydipsia, and polyphagia Encouraged avoidance of high sugar foods and beverages Lab Results  Component Value Date   HGBA1C 8.6 (H) 11/30/2022     Orders: -     HM Diabetes Foot Exam -     Hemoglobin A1c  Pleurisy Assessment & Plan: Complains of left-sided chest wall pain  Onset of symptoms 3 days ago Reports coughing once in  a while Complains of fever 3 days ago Pain is described as sharp and stabbing Pain is reported with deep breathing reports pain reports pain is constant No recent injury or trauma to the left chest wall No recent symptoms of upper respiratory infection Will get x-ray to rule out cardiovascular abnormalities and pneumonia Encouraged to continue taking gabapentin and Protonix as prescribed Symptom likely of costochondritis Encouraged supportive care with heat application to the affected site, rest, and over-the-counter Tylenol as needed for pain control  Orders: -     DG Chest 2 View  Vitamin D deficiency -     VITAMIN D 25 Hydroxy (Vit-D Deficiency, Fractures)  Other specified hypothyroidism -     TSH + free T4  Hyperlipidemia associated with type 2 diabetes mellitus (HCC) -     Lipid panel -     CMP14+EGFR -     CBC with Differential/Platelet  Note: This chart has been completed using Engineer, civil (consulting) software, and while attempts have been made to ensure accuracy, certain words and phrases may not be  transcribed as intended.    Follow-up: Return in about 3 months (around 06/02/2023).   [name], FNP

## 2023-03-10 ENCOUNTER — Ambulatory Visit (INDEPENDENT_AMBULATORY_CARE_PROVIDER_SITE_OTHER): Payer: Medicare HMO | Admitting: *Deleted

## 2023-03-10 DIAGNOSIS — E1140 Type 2 diabetes mellitus with diabetic neuropathy, unspecified: Secondary | ICD-10-CM

## 2023-03-10 DIAGNOSIS — I10 Essential (primary) hypertension: Secondary | ICD-10-CM

## 2023-03-10 NOTE — Patient Instructions (Signed)
Please call the care guide team at [phone] if you need to cancel or reschedule your appointment.   If you are experiencing a Mental Health or Behavioral Health Crisis or need someone to talk to, please call the Suicide and Crisis Lifeline: 988 call the [place] National Suicide Prevention Lifeline: (667) 319-0931 or TTY: (301) 861-5004 TTY 9363028229) to talk to a trained counselor call 1-800-273-TALK (toll free, 24 hour hotline) go to [facility] [address], [place] [phone]) call the [facility]: [phone] call 911   Following is a copy of the CCM Program Consent:  CCM service includes personalized support from designated clinical staff supervised by the physician, including individualized plan of care and coordination with other care providers 24/7 contact phone numbers for assistance for urgent and routine care needs. Service will only be billed when office clinical staff spend 20 minutes or more in a month to coordinate care. Only one practitioner may furnish and bill the service in a calendar month. The patient may stop CCM services at amy time (effective at the end of the month) by phone call to the office staff. The patient will be responsible for cost sharing (co-pay) or up to 20% of the service fee (after annual deductible is met)  Following is a copy of your full provider care plan:   Goals Addressed             This Visit's Progress    CCM (DIABETES) EXPECTED OUTCOME: MONITOR, SELF-MANAGE AND REDUCE SYMPTOMS OF DIABETES       Current Barriers:  Knowledge Deficits related to Diabetes management Chronic Disease Management support and education needs related to Diabetes, diet No Advanced Directives in place- pt declines Patient reports he lives alone, can call on his sister [name] if needed, pt states he still drives, is overall independent with his care, has cane and walker on hand but only uses if  needed, pt states he has had several falls in the past year. Patient reports he checks CBG "several times per day", has not checked fasting recently,  random ranges 140-160 with all under 200.  Planned Interventions: Reviewed medications with patient and discussed importance of medication adherence;        Reviewed prescribed diet with patient carbohydrate modified; Counseled on importance of regular laboratory monitoring as prescribed;        Discussed plans with patient for ongoing care management follow up and provided patient with direct contact information for care management team;      Reviewed scheduled/upcoming provider appointments including: primary care provider;         Advised patient, providing education and rationale, to check cbg per MD order  and record        call provider for findings outside established parameters;       Review of patient status, including review of consultants reports, relevant laboratory and other test results, and medications completed;       Reinforced safety precautions Encouraged pt to be careful when going outside in the extreme heat, go out early morning and late afternoon  Symptom Management: Take medications as prescribed   Attend all scheduled provider appointments Call pharmacy for medication refills 3-7 days in advance of running out of medications Attend church or other social activities Perform all self care activities independently  Perform IADL's (shopping, preparing meals, housekeeping, managing finances) independently Call provider office for new concerns or questions  check blood sugar at prescribed times: per doctor's order  check feet daily for cuts, sores or redness enter blood sugar readings and medication or insulin into daily log take the blood sugar log to all doctor visits take the blood sugar meter to all doctor visits set goal weight trim toenails straight across fill half of plate with vegetables limit fast food meals  to no more than 1 per week prepare main meal at home 3 to 5 days each week read food labels for fat, fiber, carbohydrates and portion size wash and dry feet carefully every day If you go outdoors in the heat- go early morning or late afternoon fall prevention strategies: change position slowly, use assistive device such as walker or cane (per provider recommendations) when walking, keep walkways clear, have good lighting in room. It is important to contact your provider if you have any falls, maintain muscle strength/tone by exercise per provider recommendations.  Follow Up Plan: Telephone follow up appointment with care management team member scheduled for:  05/31/23 at 215 pm       CCM (HYPERTENSION) EXPECTED OUTCOME: MONITOR, SELF-MANAGE AND REDUCE SYMPTOMS OF HYPERTENSION       Current Barriers:  Knowledge Deficits related to Hypertension management Chronic Disease Management support and education needs related to Hypertension, diet No Advanced Directives in place- pt declines Patient reports he checks blood pressure several times per week and "readings are alright", also gets checked at [facility] Patient reports he has all medications on hand and taking as prescribed, receives prefilled medications from [facility]  Planned Interventions: Evaluation of current treatment plan related to hypertension self management and patient's adherence to plan as established by provider;   Reviewed medications with patient and discussed importance of compliance;  Counseled on the importance of exercise goals with target of 150 minutes per week Advised patient, providing education and rationale, to monitor blood pressure daily and record, calling PCP for findings outside established parameters;  Discussed complications of poorly controlled blood pressure such as heart disease, stroke, circulatory complications, vision complications, kidney impairment, sexual dysfunction;  Reinforced low sodium diet and  importance of reading food labels for sodium content  Symptom Management: Take medications as prescribed   Attend all scheduled provider appointments Call pharmacy for medication refills 3-7 days in advance of running out of medications Attend church or other social activities Perform all self care activities independently  Perform IADL's (shopping, preparing meals, housekeeping, managing finances) independently Call provider office for new concerns or questions  check blood pressure 3 times per week choose a place to take my blood pressure (home, clinic or office, retail store) write blood pressure results in a log or diary learn about high blood pressure keep a blood pressure log take blood pressure log to all doctor appointments call doctor for signs and symptoms of high blood pressure take medications for blood pressure exactly as prescribed begin an exercise program report new symptoms to your doctor eat more whole grains, fruits and vegetables, lean meats and healthy fats Follow low sodium diet- read food labels Limit/ avoid fast food  Follow Up Plan: Telephone follow up appointment with care management team member scheduled for:   05/31/23 at 215 pm          The patient verbalized understanding of instructions, educational materials, and care plan provided today and DECLINED offer to receive copy of patient instructions, educational materials, and care plan.  Telephone follow up appointment with care management team member scheduled for:  05/31/23 at 215 pm

## 2023-03-10 NOTE — Chronic Care Management (AMB) (Signed)
Chronic Care Management   CCM RN Visit Note  03/10/2023 Name: Todd Zhang MRN: [id number] DOB: Jan 15, 1946  Subjective: Todd Zhang is a 77 y.o. year old male who is a primary care patient of Todd Laroche, FNP. The patient was referred to the Chronic Care Management team for assistance with care management needs subsequent to provider initiation of CCM services and plan of care.    Today's Visit:  Engaged with patient by telephone for follow up visit.        Goals Addressed             This Visit's Progress    CCM (DIABETES) EXPECTED OUTCOME: MONITOR, SELF-MANAGE AND REDUCE SYMPTOMS OF DIABETES       Current Barriers:  Knowledge Deficits related to Diabetes management Chronic Disease Management support and education needs related to Diabetes, diet No Advanced Directives in place- pt declines Patient reports he lives alone, can call on his sister Todd Zhang if needed, pt states he still drives, is overall independent with his care, has cane and walker on hand but only uses if needed, pt states he has had several falls in the past year. Patient reports he checks CBG "several times per day", has not checked fasting recently,  random ranges 140-160 with all under 200.  Planned Interventions: Reviewed medications with patient and discussed importance of medication adherence;        Reviewed prescribed diet with patient carbohydrate modified; Counseled on importance of regular laboratory monitoring as prescribed;        Discussed plans with patient for ongoing care management follow up and provided patient with direct contact information for care management team;      Reviewed scheduled/upcoming provider appointments including: primary care provider;         Advised patient, providing education and rationale, to check cbg per MD order  and record        call provider for findings outside established parameters;       Review of patient status, including review of consultants  reports, relevant laboratory and other test results, and medications completed;       Reinforced safety precautions Encouraged pt to be careful when going outside in the extreme heat, go out early morning and late afternoon  Symptom Management: Take medications as prescribed   Attend all scheduled provider appointments Call pharmacy for medication refills 3-7 days in advance of running out of medications Attend church or other social activities Perform all self care activities independently  Perform IADL's (shopping, preparing meals, housekeeping, managing finances) independently Call provider office for new concerns or questions  check blood sugar at prescribed times: per doctor's order  check feet daily for cuts, sores or redness enter blood sugar readings and medication or insulin into daily log take the blood sugar log to all doctor visits take the blood sugar meter to all doctor visits set goal weight trim toenails straight across fill half of plate with vegetables limit fast food meals to no more than 1 per week prepare main meal at home 3 to 5 days each week read food labels for fat, fiber, carbohydrates and portion size wash and dry feet carefully every day If you go outdoors in the heat- go early morning or late afternoon fall prevention strategies: change position slowly, use assistive device such as walker or cane (per provider recommendations) when walking, keep walkways clear, have good lighting in room. It is important to contact your provider if you have any falls,  maintain muscle strength/tone by exercise per provider recommendations.  Follow Up Plan: Telephone follow up appointment with care management team member scheduled for:  05/31/23 at 215 pm       CCM (HYPERTENSION) EXPECTED OUTCOME: MONITOR, SELF-MANAGE AND REDUCE SYMPTOMS OF HYPERTENSION       Current Barriers:  Knowledge Deficits related to Hypertension management Chronic Disease Management support and  education needs related to Hypertension, diet No Advanced Directives in place- pt declines Patient reports he checks blood pressure several times per week and "readings are alright", also gets checked at [facility] Patient reports he has all medications on hand and taking as prescribed, receives prefilled medications from [facility]  Planned Interventions: Evaluation of current treatment plan related to hypertension self management and patient's adherence to plan as established by provider;   Reviewed medications with patient and discussed importance of compliance;  Counseled on the importance of exercise goals with target of 150 minutes per week Advised patient, providing education and rationale, to monitor blood pressure daily and record, calling PCP for findings outside established parameters;  Discussed complications of poorly controlled blood pressure such as heart disease, stroke, circulatory complications, vision complications, kidney impairment, sexual dysfunction;  Reinforced low sodium diet and importance of reading food labels for sodium content  Symptom Management: Take medications as prescribed   Attend all scheduled provider appointments Call pharmacy for medication refills 3-7 days in advance of running out of medications Attend church or other social activities Perform all self care activities independently  Perform IADL's (shopping, preparing meals, housekeeping, managing finances) independently Call provider office for new concerns or questions  check blood pressure 3 times per week choose a place to take my blood pressure (home, clinic or office, retail store) write blood pressure results in a log or diary learn about high blood pressure keep a blood pressure log take blood pressure log to all doctor appointments call doctor for signs and symptoms of high blood pressure take medications for blood pressure exactly as prescribed begin an exercise program report new  symptoms to your doctor eat more whole grains, fruits and vegetables, lean meats and healthy fats Follow low sodium diet- read food labels Limit/ avoid fast food  Follow Up Plan: Telephone follow up appointment with care management team member scheduled for:   05/31/23 at 215 pm          Plan:Telephone follow up appointment with care management team member scheduled for:  05/31/23 at 215 pm  Irving Shows [facility], BSN RN Case Manager [facility] [phone]

## 2023-03-13 DIAGNOSIS — E1159 Type 2 diabetes mellitus with other circulatory complications: Secondary | ICD-10-CM | POA: Diagnosis not present

## 2023-03-13 DIAGNOSIS — I10 Essential (primary) hypertension: Secondary | ICD-10-CM

## 2023-03-13 DIAGNOSIS — Z794 Long term (current) use of insulin: Secondary | ICD-10-CM | POA: Diagnosis not present

## 2023-03-14 ENCOUNTER — Other Ambulatory Visit: Payer: Self-pay | Admitting: Nurse Practitioner

## 2023-03-14 DIAGNOSIS — I10 Essential (primary) hypertension: Secondary | ICD-10-CM

## 2023-03-16 ENCOUNTER — Telehealth: Payer: Self-pay | Admitting: Family Medicine

## 2023-03-16 DIAGNOSIS — E559 Vitamin D deficiency, unspecified: Secondary | ICD-10-CM | POA: Diagnosis not present

## 2023-03-16 DIAGNOSIS — E1169 Type 2 diabetes mellitus with other specified complication: Secondary | ICD-10-CM | POA: Diagnosis not present

## 2023-03-16 DIAGNOSIS — E038 Other specified hypothyroidism: Secondary | ICD-10-CM | POA: Diagnosis not present

## 2023-03-16 DIAGNOSIS — E1140 Type 2 diabetes mellitus with diabetic neuropathy, unspecified: Secondary | ICD-10-CM | POA: Diagnosis not present

## 2023-03-16 DIAGNOSIS — Z794 Long term (current) use of insulin: Secondary | ICD-10-CM | POA: Diagnosis not present

## 2023-03-16 DIAGNOSIS — E785 Hyperlipidemia, unspecified: Secondary | ICD-10-CM | POA: Diagnosis not present

## 2023-03-16 NOTE — Telephone Encounter (Signed)
Human form to be signed for pcp  Copied Noted Sleeved  Call patient when ready

## 2023-03-17 ENCOUNTER — Other Ambulatory Visit: Payer: Self-pay | Admitting: Family Medicine

## 2023-03-17 DIAGNOSIS — E1140 Type 2 diabetes mellitus with diabetic neuropathy, unspecified: Secondary | ICD-10-CM

## 2023-03-17 LAB — CMP14+EGFR
ALT: 19 IU/L (ref 0–44)
AST: 21 IU/L (ref 0–40)
Albumin: 4.5 g/dL (ref 3.8–4.8)
Alkaline Phosphatase: 56 IU/L (ref 44–121)
BUN/Creatinine Ratio: 22 (ref 10–24)
BUN: 43 mg/dL — ABNORMAL HIGH (ref 8–27)
Bilirubin Total: 0.3 mg/dL (ref 0.0–1.2)
CO2: 29 mmol/L (ref 20–29)
Calcium: 10.5 mg/dL — ABNORMAL HIGH (ref 8.6–10.2)
Chloride: 92 mmol/L — ABNORMAL LOW (ref 96–106)
Creatinine, Ser: 1.97 mg/dL — ABNORMAL HIGH (ref 0.76–1.27)
Globulin, Total: 2.5 g/dL (ref 1.5–4.5)
Glucose: 299 mg/dL — ABNORMAL HIGH (ref 70–99)
Potassium: 4.8 mmol/L (ref 3.5–5.2)
Sodium: 135 mmol/L (ref 134–144)
Total Protein: 7 g/dL (ref 6.0–8.5)
eGFR: 34 mL/min/1.73 — ABNORMAL LOW

## 2023-03-17 LAB — HEMOGLOBIN A1C
Est. average glucose Bld gHb Est-mCnc: 255 mg/dL
Hgb A1c MFr Bld: 10.5 % — ABNORMAL HIGH (ref 4.8–5.6)

## 2023-03-17 LAB — CBC WITH DIFFERENTIAL/PLATELET
Basophils Absolute: 0 x10E3/uL (ref 0.0–0.2)
Basos: 0 %
EOS (ABSOLUTE): 0.2 x10E3/uL (ref 0.0–0.4)
Eos: 2 %
Hematocrit: 41.8 % (ref 37.5–51.0)
Hemoglobin: 14.1 g/dL (ref 13.0–17.7)
Immature Grans (Abs): 0 x10E3/uL (ref 0.0–0.1)
Immature Granulocytes: 0 %
Lymphocytes Absolute: 3 x10E3/uL (ref 0.7–3.1)
Lymphs: 37 %
MCH: 30.9 pg (ref 26.6–33.0)
MCHC: 33.7 g/dL (ref 31.5–35.7)
MCV: 92 fL (ref 79–97)
Monocytes Absolute: 0.6 x10E3/uL (ref 0.1–0.9)
Monocytes: 7 %
Neutrophils Absolute: 4.3 x10E3/uL (ref 1.4–7.0)
Neutrophils: 54 %
Platelets: 239 x10E3/uL (ref 150–450)
RBC: 4.57 x10E6/uL (ref 4.14–5.80)
RDW: 13 % (ref 11.6–15.4)
WBC: 8.1 x10E3/uL (ref 3.4–10.8)

## 2023-03-17 LAB — TSH+FREE T4
Free T4: 1.14 ng/dL (ref 0.82–1.77)
TSH: 3.54 u[IU]/mL (ref 0.450–4.500)

## 2023-03-17 LAB — LIPID PANEL
Chol/HDL Ratio: 4.8 ratio (ref 0.0–5.0)
Cholesterol, Total: 201 mg/dL — ABNORMAL HIGH (ref 100–199)
HDL: 42 mg/dL
LDL Chol Calc (NIH): 94 mg/dL (ref 0–99)
Triglycerides: 389 mg/dL — ABNORMAL HIGH (ref 0–149)
VLDL Cholesterol Cal: 65 mg/dL — ABNORMAL HIGH (ref 5–40)

## 2023-03-17 LAB — VITAMIN D 25 HYDROXY (VIT D DEFICIENCY, FRACTURES): Vit D, 25-Hydroxy: 36.7 ng/mL (ref 30.0–100.0)

## 2023-03-17 MED ORDER — DAPAGLIFLOZIN PROPANEDIOL 10 MG PO TABS
10.0000 mg | ORAL_TABLET | Freq: Every day | ORAL | 3 refills | Status: AC
Start: 2023-03-17 — End: ?

## 2023-03-17 MED ORDER — GLIMEPIRIDE 2 MG PO TABS
2.0000 mg | ORAL_TABLET | Freq: Every day | ORAL | 3 refills | Status: AC
Start: 2023-03-17 — End: ?

## 2023-03-17 MED ORDER — OZEMPIC (0.25 OR 0.5 MG/DOSE) 2 MG/3ML ~~LOC~~ SOPN
0.2500 mg | PEN_INJECTOR | SUBCUTANEOUS | 0 refills | Status: DC
Start: 2023-03-17 — End: 2023-05-23

## 2023-03-17 NOTE — Progress Notes (Signed)
Please inform the patient his hemoglobin A1c has increased to 10.5.  I started him on Ozempic 0.25 weekly, glimepiride 2 mg daily, and Farxiga 10 mg daily.  The prescription is sent to his pharmacy.  His LDL cholesterol is not at goal but has improved from 132 to 94.  I recommend that he continues taking atorvastatin 80 mg daily, ezetimibe 10 mg daily, and fenofibrate 145 mg daily.  I recommend avoiding simple carbohydrates, including cakes, sweet desserts, ice cream, soda (diet or regular), sweet tea, candies, chips, cookies, store-bought juices, alcohol in excess of 1-2 drinks a day, lemonade, artificial sweeteners, donuts, coffee creamers, and sugar-free products.  I recommend avoiding greasy, fatty foods with increased physical activity.   Please encourage the patient to stay hydrated by increasing his fluid intake to at least 64 ounces daily.

## 2023-03-23 ENCOUNTER — Other Ambulatory Visit: Payer: Self-pay | Admitting: Family Medicine

## 2023-03-23 DIAGNOSIS — I10 Essential (primary) hypertension: Secondary | ICD-10-CM

## 2023-03-24 ENCOUNTER — Other Ambulatory Visit: Payer: Self-pay | Admitting: Family Medicine

## 2023-03-24 DIAGNOSIS — K219 Gastro-esophageal reflux disease without esophagitis: Secondary | ICD-10-CM

## 2023-03-24 DIAGNOSIS — Z794 Long term (current) use of insulin: Secondary | ICD-10-CM

## 2023-03-24 DIAGNOSIS — I10 Essential (primary) hypertension: Secondary | ICD-10-CM

## 2023-03-24 DIAGNOSIS — I739 Peripheral vascular disease, unspecified: Secondary | ICD-10-CM

## 2023-03-24 DIAGNOSIS — E7800 Pure hypercholesterolemia, unspecified: Secondary | ICD-10-CM

## 2023-03-24 MED ORDER — FENOFIBRATE 145 MG PO TABS
145.0000 mg | ORAL_TABLET | Freq: Every day | ORAL | 1 refills | Status: DC
Start: 2023-03-24 — End: 2023-07-15

## 2023-03-24 MED ORDER — FUROSEMIDE 40 MG PO TABS
40.0000 mg | ORAL_TABLET | Freq: Two times a day (BID) | ORAL | 3 refills | Status: DC
Start: 2023-03-24 — End: 2023-07-12

## 2023-03-24 MED ORDER — PANTOPRAZOLE SODIUM 40 MG PO TBEC: 40.0000 mg | DELAYED_RELEASE_TABLET | Freq: Every day | ORAL | 1 refills | Status: DC

## 2023-04-11 ENCOUNTER — Ambulatory Visit: Payer: Medicare HMO | Admitting: Internal Medicine

## 2023-04-19 ENCOUNTER — Ambulatory Visit: Payer: Medicare HMO | Admitting: Nurse Practitioner

## 2023-04-27 ENCOUNTER — Telehealth: Payer: Self-pay | Admitting: Family Medicine

## 2023-04-27 NOTE — Telephone Encounter (Signed)
Patient called in regard to diabetic shoe orders. Patient needs orders signed and faxed back in so that he can receive shoes.   Patient needs a cll back

## 2023-04-28 NOTE — Telephone Encounter (Signed)
Pt states a form was going to faxed to be signed by an MD(Dr. Lodema Hong)  for approval.

## 2023-04-29 NOTE — Telephone Encounter (Signed)
Form has not been received.

## 2023-04-29 NOTE — Telephone Encounter (Signed)
Patient called asking has this been done? Patient asking for a call back # [phone] if sent or not.

## 2023-05-03 NOTE — Telephone Encounter (Signed)
Re-faxed paperwork that Dr Lodema Hong signed to correct fax number

## 2023-05-23 ENCOUNTER — Other Ambulatory Visit: Payer: Self-pay | Admitting: Family Medicine

## 2023-05-23 DIAGNOSIS — E1140 Type 2 diabetes mellitus with diabetic neuropathy, unspecified: Secondary | ICD-10-CM

## 2023-05-23 NOTE — Progress Notes (Addendum)
Cardiology Office Note:    Date: 05/24/2023  ID:  Todd Zhang, DOB 21-Sep-1945, MRN [id number]  PCP:  [name], FNP   [facility] Providers Cardiologist:  Marjo Bicker, MD     Referring MD: [name], FNP   CC: Here for follow-up  History of Present Illness:    Todd Zhang is a 77 y.o. male with a PMH of the following: PAF, PAD (multiple toe amputations), HLD, HTN, T2DM, frequent falls, peripheral neuropathy, and CKD stage 3b (follows Dr. Wolfgang Phoenix), who presents today for scheduled follow-up.   Previously was a patient of Dr. Abelardo Diesel of [facility] cardiology.  Past CV history includes paroxysmal A-fib, s/p cardioversion in 2016.  History of PAD manifested by CLI and had ulceration and is status post left CFA endarterectomy, L below-knee popliteal with TP endarterectomy, left CFA-TP trunk bypass, and left great toe amputation in 2022.  Follows vascular surgery.  Was referred to cardiology services for evaluation of A-fib at the request of his PCP.  Established care with Dr. Jenene Slicker on July 28, 2022.  At office visit that day he noted palpitations for nearly 1 year, had associated symptom of worsening dyspnea on exertion.  EKG revealed A-fib with RVR, heart rate 111.  Noted dull chest discomfort, lasting an hour or so, random in occurrence and resolving spontaneously, typically occurring about once per month.  Denied any family history of CV disease, and also denied any other cardiac complaints or concerns. BP soft in office that day, Dr. Jenene Slicker recommended to go to ER for evaluation; however patient refused at that time.  Was started on metoprolol tartrate 25 mg twice daily and Xarelto 15 mg daily was continued.  Lasix was increased from 20 mg daily to 40 mg twice daily for dyspnea on exertion.  Was scheduled for TEE with cardioversion in 2 weeks.  Also started on Zetia 10 mg daily for his history of hyperlipidemia.  Benazepril was switched  to lisinopril 2.5 mg once daily.   While presenting for elective TEE with guided cardioversion, he stated he stopped taking all of his medicines around a week to 2 weeks prior and stated he felt better.  It was found he was in A-fib heart rate ranging from 90-100s.  His insurance had changed at that time - it would not be in complete effect until the next day, therefore his TEE guided with cardioversion canceled and scheduled for outpatient follow-up visit the following week to start low-dose metoprolol and Xarelto.  He noted at that time that he cannot read or write and preferred not to come to [facility] for any procedures unless absolutely necessary.  Dr. Jenene Slicker stated he will need a case management referral to reach out to insurance company to send prescriptions in pillboxes.  Echocardiogram scheduled for a few weeks after.  08/17/2022 - I initially saw this patient for follow-up evaluation for A-fib. Pt was overall doing well and stated he felt better after stopping all of his medications. He was not taking any medications at the time. Illiterate and only able to identify pills via color, texture, and size.  He was not monitoring his BP, didn't know how to use a BP cuff.  Denied any chest pain, shortness of breath, syncope, presyncope, dizziness, orthopnea, PND, swelling, significant weight changes, or claudication. Did admit to what sounded like occasional palpitations. Noted hx of frequent falls. Dr. Jenene Slicker was consulted regarding this patient via Secure Chat.  Was restarted on Lopressor 25 mg  twice daily, Xarelto 15 mg daily, and Plavix 75 mg daily.  Referred patient to Case Management.  He was also restarted on Lipitor, Zetia, and Tricor.  I also consulted clinical social worker, Todd Zhang, regarding this patient.   08/27/2022 - Seen for follow-up by Dr. Jenene Slicker. Due to history of recurrent falls, Xarelto was stopped.  Patient continued to note dyspnea on exertion, stable.  Denied chest  pain or syncope.  Patient also noted forgetfulness.  He stated he had no family support, no one to help him with his medical problems. He hit his head on the ground previously with no indication of ICH.  Even though he met criteria for watchman implantation, was felt he would not likely not benefit due to frequent forgetfulness.   09/03/2022 - He presented to [facility] after sustaining injuries to knee and left rib over a month ago. Did have rib fracture with previous fall. Radiologic films revealed no new rib abnormalities and no acute abnormality of knees.   09/10/22 - Seen for follow-up. Did admit to occasional dizziness. Denied chest pain, shortness of breath, palpitations, syncope, presyncope, orthopnea, PND, swelling, significant weight changes, acute bleeding, or claudication. Did admit to occasional feelings of dizziness. Poor appetite.  Stated he was looking to move, said he would be homeless and probably would have to live in his truck when he leaves his house. BP soft in office that day. Lopressor dosage was reduced. Lisinopril was stopped. A referral was made to establish care with PCP. LCSW was contacted due to patient's reported housing instability.   09/24/2022 - Saw for 2 week follow-up. Patient reported to be living alone, was still undergoing housing instability, high risk for falls, cannot read or write, and did not have a primary care provider. CC was toe on left foot "might need to be amputated," left leg has had some swelling. Hx of several toe amputations bilaterally.  Denied any fever, chills, nausea or vomiting.  Had not established care with a PCP.  Denied any chest pain, shortness of breath, syncope, presyncope, orthopnea, PND, swelling, significant weight changes, or claudication. Denied any recent falls or dizziness. Still admitted to poor appetite. Was under stress d/t housing instability and upcoming appointments. Denied any SI or HI.  I highly recommended several times to  patient to make an urgent referral to podiatry and be seen right away; however patient declined. He was agreeable to be seen for first available appt with new PCP. Referred him to PCP. UC/ED precautions discussed.  10/04/2022 - Saw Dr. Jenene Slicker for follow-up. Was overall doing well. Admitted to daily left lower extremity pain, along foot at nighttime. Podiatry and Nephrology referrals were made.   Seen at [facility] 10/21/2022 - for SSTI/gangrene of foot. Transferred to [facility] for care. 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  Followed by vascular surgery and podiatry.  Underwent left external iliac artery angioplasty with stent placement.  Subsequently underwent left second toe amputation. PT/OT suggested upon d/c. Pt refused HH therapy.   05/24/23 - Here for follow-up. Patient is a difficult historian d/t forgetfulness and distraction. Admits to generalized aches and pain, does admit to shortness of breath at times, says he gets very tired and worn out when going outside. Describes chest pain but shows difficulty trying to explain this during interview. Describes CP as intermittent, can last for days, not able to tell if noticed with exertion, stated can  notice at rest. Says cough medicine helps his symptoms when taken at night to help him sleep. Says symptoms seem to be getting worse over time. Notices pain with bending down, points along left ribcage. Denies any palpitations, syncope, presyncope, dizziness, orthopnea, PND, or significant weight changes, acute bleeding, or claudication. Admits to abdominal swelling, says he believes his abdomen is "full of fluid." Denies any recent falls. Says "It's hard for me to get back to normal, I don't want to kick the bucket."  Please see the history of present illness.    All other systems reviewed and are negative.  EKGs/Labs/Other Studies Reviewed:    The following  studies were reviewed today:  EKG:  EKG is not ordered today.    Vascular Ultrasound duplex 11/2022: Summary:  Left: Patent left common femoral artery to tibioperoneal trunk bypass  graft.  Vascular Ultrasound 11/2022: See report under "CV Proc"  ABI's 11/2022: Summary:  Right: Resting right ankle-brachial index indicates moderate right lower extremity arterial disease. The right toe-brachial index is abnormal.   Left: Resting left ankle-brachial index is within normal range.  Vascular Arterial Duplex 10/2022: Summary:  Left: Since prior study done 08/11/21, waveforms in the bypass graft have  changed from biphasic to monophasic and demonstrates a 50-70% stenosis at  the distal anastomosis.  Echocardiogram on 08/25/2022:  1. Left ventricular ejection fraction, by estimation, is 55 to 60%. The  left ventricle has normal function. The left ventricle has no regional  wall motion abnormalities. There is mild left ventricular hypertrophy.  Left ventricular diastolic function  could not be evaluated due to atrial fibrillation.   2. Right ventricular systolic function was not well visualized. The right  ventricular size is normal.   3. Left atrial size was mildly dilated.   4. The mitral valve is grossly normal. No evidence of mitral valve  regurgitation. No evidence of mitral stenosis.   5. The aortic valve is abnormal. There is moderate calcification of the  aortic valve. Aortic valve regurgitation is not visualized. Mild aortic  valve stenosis.   6. The inferior vena cava is normal in size with greater than 50%  respiratory variability, suggesting right atrial pressure of 3 mmHg.   Comparison(s): Changes from prior study are noted. Stable mild AS.  TEE on 05/01/2021:  1. Left ventricular ejection fraction, by estimation, is 60 to 65%. The  left ventricle has normal function. The left ventricle has no regional  wall motion abnormalities.   2. Right ventricular systolic function  is normal. The right ventricular  size is normal.   3. No left atrial/left atrial appendage thrombus was detected.   4. The mitral valve is normal in structure. Mild mitral valve  regurgitation. No evidence of mitral stenosis.   5. 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 Functionally  bicuspid. The aortic valve is calcified. There is moderate calcification  of the aortic valve. There is moderate thickening of the aortic valve.  Aortic valve regurgitation is not  visualized. Mild to moderate aortic valve sclerosis/calcification is  present, without any evidence of aortic stenosis. Aortic valve mean  gradient measures 8.0 mmHg. Aortic valve Vmax measures 1.96 m/s.   6. There is Moderate (Grade III) layered plaque involving the descending  aorta.   7. The inferior vena cava is normal in size with greater than 50%  respiratory variability, suggesting right atrial pressure of 3 mmHg.   Conclusion(s)/Recommendation(s): No evidence of vegetation/infective  endocarditis on this transesophageal  echocardiogram.  2D  Echo complete on 04/26/2021: 1. Left ventricular ejection fraction, by estimation, is 60 to 65%. The  left ventricle has normal function. The left ventricle has no regional  wall motion abnormalities. Left ventricular diastolic function could not  be evaluated.   2. Right ventricular systolic function is normal. The right ventricular  size is normal.   3. The mitral valve is normal in structure. No evidence of mitral valve  regurgitation. No evidence of mitral stenosis.   4. The aortic valve has an indeterminant number of cusps. Aortic valve  regurgitation is not visualized. Mild aortic valve stenosis.   5. The inferior vena cava is dilated in size with >50% respiratory  variability, suggesting right atrial pressure of 8 mmHg.  Risk Assessment/Calculations:    CHA2DS2-VASc Score = 5  This indicates a 7.2% annual risk of stroke. The patient's score is based  upon: CHF History: 0 HTN History: 1 Diabetes History: 1 Stroke History: 0 Vascular Disease History: 1 Age Score: 2 Gender Score: 0   Physical Exam:    VS:  BP 120/64   Pulse 90   Ht 6' (1.829 m)   Wt 227 lb 3.2 oz (103.1 kg)   SpO2 96%   BMI 30.81 kg/m     Wt Readings from Last 3 Encounters:  05/24/23 227 lb 3.2 oz (103.1 kg)  03/02/23 220 lb (99.8 kg)  02/09/23 219 lb (99.3 kg)     GEN: 77 y.o. male in no acute distress HEENT: Normal NECK: No JVD; No carotid bruits CARDIAC: S1/S2, irregular rhythm and regular rate, no murmurs, rubs, gallops; 2+ peripheral pulses throughout, strong and equal bilaterally RESPIRATORY:  Clear and diminished to auscultation without rales, wheezing or rhonchi  MUSCULOSKELETAL: abdominal swelling, no leg edema.  SKIN: Warm and dry NEUROLOGIC:  Alert and oriented x 3 PSYCHIATRIC:  Normal affect, distracted, forgetful  ASSESSMENT & PLAN:    In order of problems listed above:  Chest pain of uncertain etiology Etiology unclear and patient is difficult historian. Does have multiple risk factors for CAD including T2DM, PAD, HTN, and HLD. Has not undergone ischemic evaluation. Recently elevated sCr trends, would want to avoid CCTA d/t A-fib and CKD. Discussed ischemic evaluation (NST), including risks and benefits and he is agreeable, however pt appears hesitant. Patient requests that I call his family member (listed on [place]) to explain further. Will plan to call patient's family member to discuss plan. However, patient will first require volume optimization as he does show some signs/symptoms of volume overload, prior to ischemic evaluation - see #2 below. Continue atorvastatin, Plavix, Zetia, fenofibrate, and Lopressor. Heart healthy diet encouraged. Care and ED precautions discussed.  Addendum: Dr. Jenene Slicker stated, "would not pursue invasive ischemia evaluation due to frequent forgetfulness. NST is fine but not sure about his Afib rates, can  re-evaluate his symptoms after he is stablized. Agree with Echo and medical management."  2. Shortness of breath, CHF? Admits to occasional shortness of breath, etiology multifactorial. Weight is up 9 lbs from last office visit. Admits to abdominal fullness and swelling. No JVD on exam - see PE above. Will update limited Echo and will arrange the following bloodwork that patient is agreeable to: BMET, proBNP, and Magnesium. Continue Lasix. Care and ED precautions discussed.  3. Persistent A-fib Denies palpitations or tachycardia. Heart rate well controlled. Continue Lopressor.  Xarelto previously discontinued due to history of frequent falls.  Not a Watchman device candidate due to frequent forgetfulness. Continue Lopressor, as pt denies recent dizziness. Heart  healthy diet encouraged.  Previously placed [facility] care management referral d/t health literacy.    4.  Aortic valve stenosis TTE 08/2022 revealed mild aortic valve stenosis. Updating limited Echo as mentioned above d/t shortness of breath. Heart healthy diet recommended.   3. HTN BP stable. Continue current medication regimen. Previously placed a referral for [facility] Care Management. Heart healthy diet encouraged.   4. PAD Denies any claudication symptoms. Continue Plavix (cannot tolerate ASA).  Underwent left external iliac artery angioplasty with stent placement and underwent left second toe amputation during most recent hospitalization. Continue to follow-up with [facility] and PCP. Continue current medication regimen. Heart healthy diet encouraged.   5. HLD, hypertriglyceridemia Most recent labs revealed total cholesterol 201, HDL 42, LDL 94, and triglycerides 161.  Currently being managed by PCP. Continue current medication regimen. Heart healthy diet and regular cardiovascular exercise as tolerated encouraged.  6. T2DM Recent hemoglobin A1c (03/2023) was 10.5%.  Continue to follow-up with PCP, who is managing this. Heart healthy, diabetic diet  encouraged.  PCP to manage.  7. Hx of frequent falls  Denies any recent falls. No longer on Xarelto d/t hx of frequent falls. I previously offered to refer him to physical therapy to reduce his falls; however, he declined. I previously discussed SNF to patient and he has declined going to SNF for therapy or for long term stay.   8. Illiteracy Cannot read or write. Previously referred to [facility] Care Management.  LCSW has been previously consulted, following.   11. CKD stage 3b sCr baseline is 1.80-2.22. Recent sCr was 1.97 with eGFR at 34. Previously was referred to Nephrology, pt says he does not want to return see Nephrologist. Obtaining BMET as mentioned above. Continue to follow-up with PCP. Avoid nephrotoxic agents.   10. Disposition: Follow-up with APP in 4-6 weeks or sooner if anyone changes. a  Medication Adjustments/Labs and Tests Ordered: Current medicines are reviewed at length with the patient today.  Concerns regarding medicines are outlined above.  Orders Placed This Encounter  Procedures   Brain natriuretic peptide   Basic Metabolic Panel (BMET)   Magnesium   ECHOCARDIOGRAM LIMITED   No orders of the defined types were placed in this encounter.   Patient Instructions  Medication Instructions:  Your physician recommends that you continue on your current medications as directed. Please refer to the Current Medication list given to you today.  Labwork: BNP, BMET, MAG this week at [facility]   Testing/Procedures: Your physician has requested that you have an echocardiogram. Echocardiography is a painless test that uses sound waves to create images of your heart. It provides your doctor with information about the size and shape of your heart and how well your heart's chambers and valves are working. This procedure takes approximately one hour. There are no restrictions for this procedure. Please do NOT wear cologne, perfume, aftershave, or lotions (deodorant is  allowed). Please arrive 15 minutes prior to your appointment time.  Follow-Up: Your physician recommends that you schedule a follow-up appointment in: 4-6 weeks with Philis Nettle   Any Other Special Instructions Will Be Listed Below (If Applicable).    If you need a refill on your cardiac medications before your next appointment, please call your pharmacy.   Signed, Sharlene Dory, NP

## 2023-05-24 ENCOUNTER — Encounter: Payer: Self-pay | Admitting: Nurse Practitioner

## 2023-05-24 ENCOUNTER — Ambulatory Visit: Payer: Medicare HMO | Attending: Nurse Practitioner | Admitting: Nurse Practitioner

## 2023-05-24 VITALS — BP 120/64 | HR 90 | Ht 72.0 in | Wt 227.2 lb

## 2023-05-24 DIAGNOSIS — R079 Chest pain, unspecified: Secondary | ICD-10-CM | POA: Diagnosis not present

## 2023-05-24 DIAGNOSIS — I10 Essential (primary) hypertension: Secondary | ICD-10-CM | POA: Diagnosis not present

## 2023-05-24 DIAGNOSIS — E785 Hyperlipidemia, unspecified: Secondary | ICD-10-CM

## 2023-05-24 DIAGNOSIS — I350 Nonrheumatic aortic (valve) stenosis: Secondary | ICD-10-CM | POA: Diagnosis not present

## 2023-05-24 DIAGNOSIS — R0602 Shortness of breath: Secondary | ICD-10-CM | POA: Diagnosis not present

## 2023-05-24 DIAGNOSIS — N1832 Chronic kidney disease, stage 3b: Secondary | ICD-10-CM

## 2023-05-24 DIAGNOSIS — E781 Pure hyperglyceridemia: Secondary | ICD-10-CM

## 2023-05-24 DIAGNOSIS — I4819 Other persistent atrial fibrillation: Secondary | ICD-10-CM | POA: Diagnosis not present

## 2023-05-24 DIAGNOSIS — Z9181 History of falling: Secondary | ICD-10-CM

## 2023-05-24 DIAGNOSIS — E1140 Type 2 diabetes mellitus with diabetic neuropathy, unspecified: Secondary | ICD-10-CM

## 2023-05-24 DIAGNOSIS — I739 Peripheral vascular disease, unspecified: Secondary | ICD-10-CM

## 2023-05-24 DIAGNOSIS — Z794 Long term (current) use of insulin: Secondary | ICD-10-CM

## 2023-05-24 DIAGNOSIS — Z550 Illiteracy and low-level literacy: Secondary | ICD-10-CM

## 2023-05-24 NOTE — Patient Instructions (Addendum)
Medication Instructions:  Your physician recommends that you continue on your current medications as directed. Please refer to the Current Medication list given to you today.  Labwork: BNP, BMET, MAG this week at [facility]   Testing/Procedures: Your physician has requested that you have an echocardiogram. Echocardiography is a painless test that uses sound waves to create images of your heart. It provides your doctor with information about the size and shape of your heart and how well your heart's chambers and valves are working. This procedure takes approximately one hour. There are no restrictions for this procedure. Please do NOT wear cologne, perfume, aftershave, or lotions (deodorant is allowed). Please arrive 15 minutes prior to your appointment time.  Follow-Up: Your physician recommends that you schedule a follow-up appointment in: 4-6 weeks with Philis Nettle   Any Other Special Instructions Will Be Listed Below (If Applicable).    If you need a refill on your cardiac medications before your next appointment, please call your pharmacy.

## 2023-05-25 ENCOUNTER — Other Ambulatory Visit (HOSPITAL_COMMUNITY)
Admission: RE | Admit: 2023-05-25 | Discharge: 2023-05-25 | Disposition: A | Discharge status: Home / Self Care | Payer: Medicare HMO | Source: Ambulatory Visit | Attending: Nurse Practitioner | Admitting: Nurse Practitioner

## 2023-05-25 ENCOUNTER — Other Ambulatory Visit: Payer: Self-pay | Admitting: Nurse Practitioner

## 2023-05-25 DIAGNOSIS — N1832 Chronic kidney disease, stage 3b: Secondary | ICD-10-CM

## 2023-05-25 LAB — BASIC METABOLIC PANEL
Anion gap: 10 (ref 5–15)
BUN: 44 mg/dL — ABNORMAL HIGH (ref 8–23)
CO2: 28 mmol/L (ref 22–32)
Calcium: 9.1 mg/dL (ref 8.9–10.3)
Chloride: 96 mmol/L — ABNORMAL LOW (ref 98–111)
Creatinine, Ser: 2.21 mg/dL — ABNORMAL HIGH (ref 0.61–1.24)
GFR, Estimated: 30 mL/min — ABNORMAL LOW (ref >60)
Glucose, Bld: 246 mg/dL — ABNORMAL HIGH (ref 70–99)
Potassium: 4.1 mmol/L (ref 3.5–5.1)
Sodium: 134 mmol/L — ABNORMAL LOW (ref 135–145)

## 2023-05-25 LAB — BRAIN NATRIURETIC PEPTIDE: B Natriuretic Peptide: 153 pg/mL — ABNORMAL HIGH (ref 0.0–100.0)

## 2023-05-25 LAB — MAGNESIUM: Magnesium: 1.6 mg/dL — ABNORMAL LOW (ref 1.7–2.4)

## 2023-05-31 ENCOUNTER — Other Ambulatory Visit: Payer: Medicare HMO | Admitting: *Deleted

## 2023-05-31 ENCOUNTER — Ambulatory Visit: Payer: Medicare HMO | Attending: Nurse Practitioner

## 2023-05-31 ENCOUNTER — Encounter: Payer: Self-pay | Admitting: *Deleted

## 2023-05-31 DIAGNOSIS — R0602 Shortness of breath: Secondary | ICD-10-CM

## 2023-05-31 NOTE — Patient Outreach (Signed)
Care Management   Visit Note  05/31/2023 Name: Todd Zhang MRN: [id number] DOB: 1946-03-25  Subjective: Todd Zhang is a 77 y.o. year old male who is a primary care patient of Todd Laroche, FNP. The Care Management team was consulted for assistance.       Goals Addressed             This Visit's Progress    CCM (DIABETES) EXPECTED OUTCOME: MONITOR, SELF-MANAGE AND REDUCE SYMPTOMS OF DIABETES       Current Barriers:  Knowledge Deficits related to Diabetes management Chronic Disease Management support and education needs related to Diabetes, diet No Advanced Directives in place- pt declines Patient reports he lives alone, can call on his sister Todd Zhang if needed, pt states he still drives, is overall independent with his care, has cane and walker on hand but only uses if needed, pt states he has had several falls in the past year. Patient reports he checks CBG "several times per day" with fasting readings 98-125 recently, states " it's improved since I quit eating so much fruit", pt hopes next [facility] is improved with last AIC 10.5 on 03/16/23 and pt feels solely due to diet.  Planned Interventions: Reviewed medications with patient and discussed importance of medication adherence;        Reviewed prescribed diet with patient carbohydrate modified; Counseled on importance of regular laboratory monitoring as prescribed;        Discussed plans with patient for ongoing care management follow up and provided patient with direct contact information for care management team;      Reviewed scheduled/upcoming provider appointments including: primary care provider;         Advised patient, providing education and rationale, to check cbg per MD order  and record        call provider for findings outside established parameters;       Review of patient status, including review of consultants reports, relevant laboratory and other test results, and medications completed;       Reviewed  safety precautions  Symptom Management: Take medications as prescribed   Attend all scheduled provider appointments Call pharmacy for medication refills 3-7 days in advance of running out of medications Attend church or other social activities Perform all self care activities independently  Perform IADL's (shopping, preparing meals, housekeeping, managing finances) independently Call provider office for new concerns or questions  check blood sugar at prescribed times: per doctor's order  check feet daily for cuts, sores or redness enter blood sugar readings and medication or insulin into daily log take the blood sugar log to all doctor visits take the blood sugar meter to all doctor visits set goal weight trim toenails straight across fill half of plate with vegetables limit fast food meals to no more than 1 per week prepare main meal at home 3 to 5 days each week read food labels for fat, fiber, carbohydrates and portion size wash and dry feet carefully every day Be mindful of carbohydrate intake with each meal- too much bread, potatoes, rice, pasta or any starchy foods at each meal will elevate blood sugar fall prevention strategies: change position slowly, use assistive device such as walker or cane (per provider recommendations) when walking, keep walkways clear, have good lighting in room. It is important to contact your provider if you have any falls, maintain muscle strength/tone by exercise per provider recommendations.  Follow Up Plan: Telephone follow up appointment with care management team member scheduled for:  08/17/23 at 215 pm       CCM (HYPERTENSION) EXPECTED OUTCOME: MONITOR, SELF-MANAGE AND REDUCE SYMPTOMS OF HYPERTENSION       Current Barriers:  Knowledge Deficits related to Hypertension management Chronic Disease Management support and education needs related to Hypertension, diet No Advanced Directives in place- pt declines Patient reports he checks blood  pressure several times per week and "readings are alright", also gets checked at [facility] Patient reports he has all medications on hand and taking as prescribed, receives prefilled medications from [facility]  Planned Interventions: Evaluation of current treatment plan related to hypertension self management and patient's adherence to plan as established by provider;   Reviewed medications with patient and discussed importance of compliance;  Counseled on the importance of exercise goals with target of 150 minutes per week Advised patient, providing education and rationale, to monitor blood pressure daily and record, calling PCP for findings outside established parameters;  Discussed complications of poorly controlled blood pressure such as heart disease, stroke, circulatory complications, vision complications, kidney impairment, sexual dysfunction;  Reviewed low sodium diet and importance of reading food labels for sodium content  Symptom Management: Take medications as prescribed   Attend all scheduled provider appointments Call pharmacy for medication refills 3-7 days in advance of running out of medications Attend church or other social activities Perform all self care activities independently  Perform IADL's (shopping, preparing meals, housekeeping, managing finances) independently Call provider office for new concerns or questions  check blood pressure 3 times per week choose a place to take my blood pressure (home, clinic or office, retail store) write blood pressure results in a log or diary learn about high blood pressure keep a blood pressure log take blood pressure log to all doctor appointments call doctor for signs and symptoms of high blood pressure take medications for blood pressure exactly as prescribed begin an exercise program report new symptoms to your doctor eat more whole grains, fruits and vegetables, lean meats and healthy fats Follow low sodium diet- read food  labels Limit/ avoid fast food  Follow Up Plan: Telephone follow up appointment with care management team member scheduled for:   08/17/23 at 215 pm           Plan: Telephone follow up appointment with care management team member scheduled for: 08/17/23 at 215 pm  Irving Shows [facility], BSN [place]/ Ambulatory Care Management [phone]

## 2023-05-31 NOTE — Patient Instructions (Signed)
Visit Information  Thank you for taking time to visit with me today. Please don't hesitate to contact me if I can be of assistance to you before our next scheduled telephone appointment.  Following are the goals we discussed today:   Goals Addressed             This Visit's Progress    CCM (DIABETES) EXPECTED OUTCOME: MONITOR, SELF-MANAGE AND REDUCE SYMPTOMS OF DIABETES       Current Barriers:  Knowledge Deficits related to Diabetes management Chronic Disease Management support and education needs related to Diabetes, diet No Advanced Directives in place- pt declines Patient reports he lives alone, can call on his sister [name] if needed, pt states he still drives, is overall independent with his care, has cane and walker on hand but only uses if needed, pt states he has had several falls in the past year. Patient reports he checks CBG "several times per day" with fasting readings 98-125 recently, states " it's improved since I quit eating so much fruit", pt hopes next [facility] is improved with last AIC 10.5 on 03/16/23 and pt feels solely due to diet.  Planned Interventions: Reviewed medications with patient and discussed importance of medication adherence;        Reviewed prescribed diet with patient carbohydrate modified; Counseled on importance of regular laboratory monitoring as prescribed;        Discussed plans with patient for ongoing care management follow up and provided patient with direct contact information for care management team;      Reviewed scheduled/upcoming provider appointments including: primary care provider;         Advised patient, providing education and rationale, to check cbg per MD order  and record        call provider for findings outside established parameters;       Review of patient status, including review of consultants reports, relevant laboratory and other test results, and medications completed;       Reviewed safety precautions  Symptom  Management: Take medications as prescribed   Attend all scheduled provider appointments Call pharmacy for medication refills 3-7 days in advance of running out of medications Attend church or other social activities Perform all self care activities independently  Perform IADL's (shopping, preparing meals, housekeeping, managing finances) independently Call provider office for new concerns or questions  check blood sugar at prescribed times: per doctor's order  check feet daily for cuts, sores or redness enter blood sugar readings and medication or insulin into daily log take the blood sugar log to all doctor visits take the blood sugar meter to all doctor visits set goal weight trim toenails straight across fill half of plate with vegetables limit fast food meals to no more than 1 per week prepare main meal at home 3 to 5 days each week read food labels for fat, fiber, carbohydrates and portion size wash and dry feet carefully every day Be mindful of carbohydrate intake with each meal- too much bread, potatoes, rice, pasta or any starchy foods at each meal will elevate blood sugar fall prevention strategies: change position slowly, use assistive device such as walker or cane (per provider recommendations) when walking, keep walkways clear, have good lighting in room. It is important to contact your provider if you have any falls, maintain muscle strength/tone by exercise per provider recommendations.  Follow Up Plan: Telephone follow up appointment with care management team member scheduled for:  08/17/23 at 215 pm  CCM (HYPERTENSION) EXPECTED OUTCOME: MONITOR, SELF-MANAGE AND REDUCE SYMPTOMS OF HYPERTENSION       Current Barriers:  Knowledge Deficits related to Hypertension management Chronic Disease Management support and education needs related to Hypertension, diet No Advanced Directives in place- pt declines Patient reports he checks blood pressure several times per week and  "readings are alright", also gets checked at [facility] Patient reports he has all medications on hand and taking as prescribed, receives prefilled medications from [facility]  Planned Interventions: Evaluation of current treatment plan related to hypertension self management and patient's adherence to plan as established by provider;   Reviewed medications with patient and discussed importance of compliance;  Counseled on the importance of exercise goals with target of 150 minutes per week Advised patient, providing education and rationale, to monitor blood pressure daily and record, calling PCP for findings outside established parameters;  Discussed complications of poorly controlled blood pressure such as heart disease, stroke, circulatory complications, vision complications, kidney impairment, sexual dysfunction;  Reviewed low sodium diet and importance of reading food labels for sodium content  Symptom Management: Take medications as prescribed   Attend all scheduled provider appointments Call pharmacy for medication refills 3-7 days in advance of running out of medications Attend church or other social activities Perform all self care activities independently  Perform IADL's (shopping, preparing meals, housekeeping, managing finances) independently Call provider office for new concerns or questions  check blood pressure 3 times per week choose a place to take my blood pressure (home, clinic or office, retail store) write blood pressure results in a log or diary learn about high blood pressure keep a blood pressure log take blood pressure log to all doctor appointments call doctor for signs and symptoms of high blood pressure take medications for blood pressure exactly as prescribed begin an exercise program report new symptoms to your doctor eat more whole grains, fruits and vegetables, lean meats and healthy fats Follow low sodium diet- read food labels Limit/ avoid fast  food  Follow Up Plan: Telephone follow up appointment with care management team member scheduled for:   08/17/23 at 215 pm           Our next appointment is by telephone on 08/17/23 at 215 pm  Please call the care guide team at [phone] if you need to cancel or reschedule your appointment.   If you are experiencing a Mental Health or Behavioral Health Crisis or need someone to talk to, please call the Suicide and Crisis Lifeline: 988 call the [place] National Suicide Prevention Lifeline: 662-422-8876 or TTY: (347)497-7845 TTY (279)093-5034) to talk to a trained counselor call 1-800-273-TALK (toll free, 24 hour hotline) go to [facility] [address] [place] [phone]) call the [facility] Line: [phone] call 911   The patient verbalized understanding of instructions, educational materials, and care plan provided today and DECLINED offer to receive copy of patient instructions, educational materials, and care plan.   Telephone follow up appointment with care management team member scheduled for:  08/17/23 at 215 pm  Irving Shows [facility], BSN [place]/ Ambulatory Care Management [phone]

## 2023-06-02 ENCOUNTER — Ambulatory Visit (INDEPENDENT_AMBULATORY_CARE_PROVIDER_SITE_OTHER): Payer: Medicare HMO | Admitting: Family Medicine

## 2023-06-02 ENCOUNTER — Encounter: Payer: Self-pay | Admitting: Family Medicine

## 2023-06-02 VITALS — BP 110/67 | HR 87 | Resp 16 | Ht 72.0 in | Wt 226.0 lb

## 2023-06-02 DIAGNOSIS — I10 Essential (primary) hypertension: Secondary | ICD-10-CM

## 2023-06-02 DIAGNOSIS — R7301 Impaired fasting glucose: Secondary | ICD-10-CM

## 2023-06-02 DIAGNOSIS — E559 Vitamin D deficiency, unspecified: Secondary | ICD-10-CM

## 2023-06-02 DIAGNOSIS — E038 Other specified hypothyroidism: Secondary | ICD-10-CM | POA: Diagnosis not present

## 2023-06-02 DIAGNOSIS — E1140 Type 2 diabetes mellitus with diabetic neuropathy, unspecified: Secondary | ICD-10-CM

## 2023-06-02 DIAGNOSIS — Z794 Long term (current) use of insulin: Secondary | ICD-10-CM | POA: Diagnosis not present

## 2023-06-02 DIAGNOSIS — E1169 Type 2 diabetes mellitus with other specified complication: Secondary | ICD-10-CM

## 2023-06-02 DIAGNOSIS — E7849 Other hyperlipidemia: Secondary | ICD-10-CM

## 2023-06-02 NOTE — Patient Instructions (Addendum)
I appreciate the opportunity to provide care to you today!    Follow up:  3 months  Fasting Labs: please stop by the lab next month to get your blood drawn (CBC, CMP, TSH, Lipid profile, HgA1c, Vit D)  Management Plan for Type 2 Diabetes  Please continue taking the following medications for your Type 2 diabetes: glimepiride 2 mg daily, Farxiga 10 mg daily, metformin 500 mg twice daily, and Humalog 10 units three times daily before meals and Ozempic 0.25 mg weekly.  I recommend requesting a refill from the pharmacy to increase your Ozempic next month. We will also consider discontinuing glimepiride 2 mg daily and increasing your Ozempic dosage to 5 mg weekly at that time.  Additionally, I encourage you to decrease your intake of high-sugar foods and beverages while increasing your physical activity to achieve adequate glycemic control.   Here are some foods to avoid or reduce in your diet to help manage cholesterol levels:  Fried Foods:Deep-fried items such as french fries, fried chicken, and fried snacks are high in unhealthy fats and can raise LDL (bad) cholesterol levels. Processed Meats:Foods like bacon, sausage, hot dogs, and deli meats are often high in saturated fat and cholesterol. Full-Fat Dairy Products:Whole milk, full-fat yogurt, butter, cream, and cheese are rich in saturated fats, which can increase cholesterol levels. Baked Goods and Sweets:Pastries, cakes, cookies, and donuts often contain trans fats and added sugars, which can raise LDL cholesterol and lower HDL (good) cholesterol. Red Meat:Beef, lamb, and pork are high in saturated fat. Lean cuts or plant-based protein alternatives are better options. Lard and Shortening:Used in some baked goods, lard and shortening are high in trans fats and should be avoided. Fast Food:Many fast food items are cooked with unhealthy oils and contain high amounts of saturated and trans fats. Processed Snacks:Chips, crackers, and certain  microwave popcorns can contain trans fats and high levels of unhealthy oils. Shellfish:While nutritious in other ways, some shellfish like shrimp, lobster, and crab are high in cholesterol. They should be consumed in moderation. Coconut and Palm Oils:these oils are high in saturated fat and can raise cholesterol levels when used in cooking or baking.     Please continue to a heart-healthy diet and increase your physical activities. Try to exercise for at least five days a week.    It was a pleasure to see you and I look forward to continuing to work together on your health and well-being. Please do not hesitate to call the office if you need care or have questions about your care.  In case of emergency, please visit the Emergency Department for urgent care, or contact our clinic at [phone] to schedule an appointment. We're here to help you!   Have a wonderful day and week. With Gratitude, [name] MSN, FNP-BC

## 2023-06-02 NOTE — Progress Notes (Signed)
Established Patient Office Visit  Subjective:  Patient ID: Todd Zhang, male    DOB: 05-12-1946  Age: 77 y.o. MRN: [id number]  CC:  Chief Complaint  Patient presents with   Diabetes    Follow up 3 months    HPI Todd Zhang is a 77 y.o. male with past medical history of type 2 diabetes, hyperlipidemia presents for f/u of  chronic medical conditions  Hyperlipidemia:The patient is currently taking atorvastatin 80 mg daily, ezetimibe 10 mg daily, and fenofibrate 145 mg daily. He reports full compliance with his treatment regimen.  Type 2 Diabetes:The patient is under the care of endocrinology and is following a treatment plan that includes Humulin insulin administered three times daily, metformin 500 mg twice daily, glimepiride 2 mg daily, and Farxiga 10 mg daily. He denies experiencing symptoms of polyuria, polyphagia and reports compliance with his treatment regimen.  Hypertension: He takes metoprolol 25 mg twice daily and furosemide 40 mg twice daily and reports treatment compliance.  Past Medical History:  Diagnosis Date   Atrial fibrillation (HCC)    Cervical spinal stenosis    DDD (degenerative disc disease), cervical    DDD (degenerative disc disease), lumbar    Diabetes mellitus without complication (HCC)    Dyspnea    Hyperlipidemia    Hypertension    Peripheral artery disease (HCC)     Past Surgical History:  Procedure Laterality Date   ABDOMINAL AORTOGRAM W/LOWER EXTREMITY N/A 04/30/2021   Procedure: ABDOMINAL AORTOGRAM W/LOWER EXTREMITY;  Surgeon: Leonie Douglas, MD;  Location: [facility];  Service: Cardiovascular;  Laterality: N/A;   ABDOMINAL AORTOGRAM W/LOWER EXTREMITY N/A 10/25/2022   Procedure: ABDOMINAL AORTOGRAM W/LOWER EXTREMITY;  Surgeon: [name], MD;  Location: [facility];  Service: Cardiovascular;  Laterality: N/A;   AMPUTATION TOE     AMPUTATION TOE Left 05/04/2021   Procedure: LEFT GREAT TOE AMPUTATION;  Surgeon: [name], MD;  Location: [facility] OR;  Service: Vascular;  Laterality: Left;   AMPUTATION TOE Left 10/26/2022   Procedure: AMPUTATION LEFT SECOND TOE;  Surgeon: Louann Sjogren, DPM;  Location: [facility] OR;  Service: Podiatry;  Laterality: Left;   BYPASS GRAFT FEMORAL-PERONEAL Left 05/04/2021   Procedure: LEFT COMMON FEMORAL-TIBIOPERONEAL TRUNK BYPASS;  Surgeon: [name], MD;  Location: [facility] OR;  Service: Vascular;  Laterality: Left;   ENDARTERECTOMY FEMORAL Left 05/04/2021   Procedure: LEFT COMMON FEMORAL ENDARTERECTOMY WITH BOVINE PATCH;  Surgeon: [name], MD;  Location: [facility] OR;  Service: Vascular;  Laterality: Left;   ENDARTERECTOMY TIBIOPERONEAL Left 05/04/2021   Procedure: LEFT BELOW KNEE POPITEAL WITH  TIBIOPERONEAL ENDARTERECTOMY;  Surgeon: [name], MD;  Location: [facility] OR;  Service: Vascular;  Laterality: Left;   ENDOVEIN HARVEST OF GREATER SAPHENOUS VEIN  05/04/2021   Procedure: HARVEST OF LEFT GREATER SAPHENOUS VEIN;  Surgeon: [name], MD;  Location: [facility] OR;  Service: Vascular;;   INSERTION OF ILIAC STENT Left 05/04/2021   Procedure: LEFT COMMON ILLIAC ATERY ANGIOPLASTY WITH STENT;  Surgeon: [name], MD;  Location: [facility] OR;  Service: Vascular;  Laterality: Left;   INTRAOPERATIVE ARTERIOGRAM Left 05/04/2021   Procedure: LEFT ARTERY ARTERIOGRAM;  Surgeon: [name], MD;  Location: [facility] OR;  Service: Vascular;  Laterality: Left;   IRRIGATION AND DEBRIDEMENT FOOT Left 05/04/2021   Procedure: IRRIGATION AND DEBRIDEMENT FOOT;  Surgeon: [name], MD;  Location: [facility] OR;  Service: Vascular;  Laterality: Left;   KNEE SURGERY Left  NOSE SURGERY     PERIPHERAL VASCULAR INTERVENTION  10/25/2022   Procedure: PERIPHERAL VASCULAR INTERVENTION;  Surgeon: [name], MD;  Location: [facility];  Service: Cardiovascular;;   TEE WITHOUT CARDIOVERSION N/A 05/01/2021   Procedure: TRANSESOPHAGEAL ECHOCARDIOGRAM (TEE);  Surgeon:  Chilton Si, MD;  Location: [facility] ENDOSCOPY;  Service: Cardiovascular;  Laterality: N/A;    Family History  Problem Relation Age of Onset   Stroke Mother    Heart disease Mother        CABG   Cancer Father     Social History   Socioeconomic History   Marital status: Divorced    Spouse name: Not on file   Number of children: 2   Years of education: 11   Highest education level: 11th grade  Occupational History   Occupation: Disabled  Tobacco Use   Smoking status: Former    Current packs/day: 0.00    Average packs/day: 1 pack/day for 45.0 years (45.0 ttl pk-yrs)    Types: Cigarettes    Start date: 06/19/1966    Quit date: 06/20/2011    Years since quitting: 11.9    Passive exposure: Past   Smokeless tobacco: Never  Vaping Use   Vaping status: Never Used  Substance and Sexual Activity   Alcohol use: No    Comment: quit drinking in 1982   Drug use: Not Currently    Comment: marijuana and cocaine in the past, quit in 2012   Sexual activity: Not Currently  Other Topics Concern   Not on file  Social History Narrative   Lives alone - ground level apartment   Social Determinants of Health   Financial Resource Strain: Low Risk  (02/09/2023)   Overall Financial Resource Strain (CARDIA)    Difficulty of Paying Living Expenses: Not hard at all  Food Insecurity: No Food Insecurity (02/09/2023)   Hunger Vital Sign    Worried About Running Out of Food in the Last Year: Never true    Ran Out of Food in the Last Year: Never true  Transportation Needs: No Transportation Needs (02/09/2023)   PRAPARE - Administrator, Civil Service (Medical): No    Lack of Transportation (Non-Medical): No  Physical Activity: Inactive (02/09/2023)   Exercise Vital Sign    Days of Exercise per Week: 0 days    Minutes of Exercise per Session: 0 min  Stress: No Stress Concern Present (02/09/2023)   [employer] of Occupational Health - Occupational Stress Questionnaire    Feeling of  Stress : Not at all  Social Connections: Socially Integrated (02/09/2023)   Social Connection and Isolation Panel [NHANES]    Frequency of Communication with Friends and Family: More than three times a week    Frequency of Social Gatherings with Friends and Family: More than three times a week    Attends Religious Services: More than 4 times per year    Active Member of [employer] or Organizations: Yes    Attends Engineer, structural: More than 4 times per year    Marital Status: Married  Recent Concern: Social Connections - Moderately Isolated (12/24/2022)   Social Connection and Isolation Panel [NHANES]    Frequency of Communication with Friends and Family: More than three times a week    Frequency of Social Gatherings with Friends and Family: More than three times a week    Attends Religious Services: More than 4 times per year    Active Member of Clubs or Organizations: No  Attends Banker Meetings: Never    Marital Status: Divorced  Catering manager Violence: Not At Risk (02/09/2023)   Humiliation, Afraid, Rape, and Kick questionnaire    Fear of Current or Ex-Partner: No    Emotionally Abused: No    Physically Abused: No    Sexually Abused: No    Outpatient Medications Prior to Visit  Medication Sig Dispense Refill   atorvastatin (LIPITOR) 80 MG tablet Take 1 tablet (80 mg total) by mouth daily. 90 tablet 3   Blood Glucose Monitoring Suppl (GNP TRUE METRIX GLUCOSE METER) w/Device KIT      clopidogrel (PLAVIX) 75 MG tablet Take 1 tablet (75 mg total) by mouth daily. 90 tablet 1   dapagliflozin propanediol (FARXIGA) 10 MG TABS tablet Take 1 tablet (10 mg total) by mouth daily before breakfast. 30 tablet 3   ezetimibe (ZETIA) 10 MG tablet Take 1 tablet (10 mg total) by mouth daily. 90 tablet 3   fenofibrate (TRICOR) 145 MG tablet Take 1 tablet (145 mg total) by mouth daily. 90 tablet 1   furosemide (LASIX) 40 MG tablet Take 1 tablet (40 mg total) by mouth 2 (two)  times daily. 60 tablet 3   gabapentin (NEURONTIN) 300 MG capsule TAKE ONE CAPSULE BY MOUTH FOUR TIMES DAILY 120 capsule 5   glimepiride (AMARYL) 2 MG tablet Take 1 tablet (2 mg total) by mouth daily before breakfast. 30 tablet 3   glucose blood (TRUE METRIX BLOOD GLUCOSE TEST) test strip CHECK BLOOD SUGAR FOUR TIMES DAILY Dx E11.40 400 each 3   insulin regular (HUMULIN R) 100 units/mL injection INJECT 10 UNITS INTO THE SKIN THREE TIMES DAILY BEFORE MEALS 30 mL 1   Insulin Syringe-Needle U-100 (EASY TOUCH INSULIN SYRINGE) 31G X 5/16" 0.3 ML MISC USE WITH INSULIN EVERY DAY Dx E11.40 100 each 3   Lancets (UNILET MICRO-THIN 33G) MISC CHECK BLOOD SUGAR FOUR TIMES DAILY Dx E11.40 400 each 3   metFORMIN (GLUCOPHAGE) 500 MG tablet TAKE 1 TABLET BY MOUTH TWICE DAILY 180 tablet 3   metoprolol tartrate (LOPRESSOR) 25 MG tablet Take 1 tablet (25 mg total) by mouth 2 (two) times daily. 180 tablet 3   OZEMPIC, 0.25 OR 0.5 MG/DOSE, 2 MG/3ML SOPN INJECT 0.25 MG SUBCUTANEOUSLY ONCE A WEEK 3 mL 0   pantoprazole (PROTONIX) 40 MG tablet Take 1 tablet (40 mg total) by mouth daily. 90 tablet 1   tamsulosin (FLOMAX) 0.4 MG CAPS capsule TAKE ONE CAPSULE BY MOUTH DAILY 90 capsule 5   No facility-administered medications prior to visit.    Allergies  Allergen Reactions   Asa [Aspirin] Hives   Penicillins Hives    Hives + vomiting as an adult  - tolerated Ceftriaxone 04/2021   Tetanus Toxoid     unknown    ROS Review of Systems  Constitutional:  Negative for fatigue and fever.  Respiratory:  Negative for cough and shortness of breath.   Cardiovascular:  Negative for leg swelling.  Endocrine: Positive for polydipsia.  Neurological:  Negative for dizziness and light-headedness.      Objective:    Physical Exam HENT:     Head: Normocephalic.     Right Ear: External ear normal.     Left Ear: External ear normal.     Nose: No congestion or rhinorrhea.     Mouth/Throat:     Mouth: Mucous membranes are  moist.  Cardiovascular:     Rate and Rhythm: Regular rhythm.     Heart sounds: No  murmur heard. Pulmonary:     Effort: No respiratory distress.     Breath sounds: Normal breath sounds.  Neurological:     Mental Status: He is alert.     BP 110/67   Pulse 87   Resp 16   Ht 6' (1.829 m)   Wt 226 lb (102.5 kg)   SpO2 97%   BMI 30.65 kg/m  Wt Readings from Last 3 Encounters:  06/02/23 226 lb (102.5 kg)  05/24/23 227 lb 3.2 oz (103.1 kg)  03/02/23 220 lb (99.8 kg)    Lab Results  Component Value Date   TSH 3.540 03/16/2023   Lab Results  Component Value Date   WBC 8.1 03/16/2023   HGB 14.1 03/16/2023   HCT 41.8 03/16/2023   MCV 92 03/16/2023   PLT 239 03/16/2023   Lab Results  Component Value Date   NA 134 (L) 05/25/2023   K 4.1 05/25/2023   CO2 28 05/25/2023   GLUCOSE 246 (H) 05/25/2023   BUN 44 (H) 05/25/2023   CREATININE 2.21 (H) 05/25/2023   BILITOT 0.3 03/16/2023   ALKPHOS 56 03/16/2023   AST 21 03/16/2023   ALT 19 03/16/2023   PROT 7.0 03/16/2023   ALBUMIN 4.5 03/16/2023   CALCIUM 9.1 05/25/2023   ANIONGAP 10 05/25/2023   EGFR 34 (L) 03/16/2023   Lab Results  Component Value Date   CHOL 201 (H) 03/16/2023   Lab Results  Component Value Date   HDL 42 03/16/2023   Lab Results  Component Value Date   LDLCALC 94 03/16/2023   Lab Results  Component Value Date   TRIG 389 (H) 03/16/2023   Lab Results  Component Value Date   CHOLHDL 4.8 03/16/2023   Lab Results  Component Value Date   HGBA1C 10.5 (H) 03/16/2023      Assessment & Plan:  Essential hypertension Assessment & Plan: Controlled Denies headaches, dizziness, blurred vision, chest pain, palpitation, shortness of breath Encouraged to continue taking metoprolol 25 mg twice daily, and furosemide 40 mg twice daily Encouraged low-sodium diet with increased physical activity BP Readings from Last 3 Encounters:  06/02/23 110/67  05/24/23 120/64  03/02/23 129/73      Other  hyperlipidemia Assessment & Plan: Encouraged to continue taking atorvastatin 80 mg daily, ezetimibe 10 mg daily, and fenofibrate 145 mg daily Encouraged decreasing his intake of greasy, fatty, starchy foods with increase with activity Will assess lipid panel today Lab Results  Component Value Date   CHOL 201 (H) 03/16/2023   HDL 42 03/16/2023   LDLCALC 94 03/16/2023   TRIG 389 (H) 03/16/2023   CHOLHDL 4.8 03/16/2023     Orders: -     Lipid panel -     CMP14+EGFR -     CBC with Differential/Platelet  Type 2 diabetes mellitus with diabetic neuropathy, with long-term current use of insulin (HCC) Assessment & Plan: Encouraged to continue to follow endocrinology and to continue his current treatment regiment Recommend decreasing his intake of high sugar foods and beverages and increasing his physical activity Lab Results  Component Value Date   HGBA1C 10.5 (H) 03/16/2023      IFG (impaired fasting glucose) -     Hemoglobin A1c  Vitamin D deficiency -     VITAMIN D 25 Hydroxy (Vit-D Deficiency, Fractures)  Other specified hypothyroidism -     TSH + free T4  Note: This chart has been completed using [employer], and while attempts have been made to  ensure accuracy, certain words and phrases may not be transcribed as intended.    Follow-up: Return in about 3 months (around 09/01/2023).   [name], FNP

## 2023-06-03 NOTE — Assessment & Plan Note (Signed)
Encouraged to continue to follow endocrinology and to continue his current treatment regiment Recommend decreasing his intake of high sugar foods and beverages and increasing his physical activity Lab Results  Component Value Date   HGBA1C 10.5 (H) 03/16/2023

## 2023-06-03 NOTE — Assessment & Plan Note (Signed)
Controlled Denies headaches, dizziness, blurred vision, chest pain, palpitation, shortness of breath Encouraged to continue taking metoprolol 25 mg twice daily, and furosemide 40 mg twice daily Encouraged low-sodium diet with increased physical activity BP Readings from Last 3 Encounters:  06/02/23 110/67  05/24/23 120/64  03/02/23 129/73

## 2023-06-03 NOTE — Assessment & Plan Note (Signed)
Encouraged to continue taking atorvastatin 80 mg daily, ezetimibe 10 mg daily, and fenofibrate 145 mg daily Encouraged decreasing his intake of greasy, fatty, starchy foods with increase with activity Will assess lipid panel today Lab Results  Component Value Date   CHOL 201 (H) 03/16/2023   HDL 42 03/16/2023   LDLCALC 94 03/16/2023   TRIG 389 (H) 03/16/2023   CHOLHDL 4.8 03/16/2023

## 2023-06-09 ENCOUNTER — Ambulatory Visit (INDEPENDENT_AMBULATORY_CARE_PROVIDER_SITE_OTHER)
Admission: RE | Admit: 2023-06-09 | Discharge: 2023-06-09 | Disposition: A | Discharge status: Home / Self Care | Payer: Medicare HMO | Source: Ambulatory Visit | Attending: Vascular Surgery | Admitting: Vascular Surgery

## 2023-06-09 ENCOUNTER — Ambulatory Visit: Payer: Medicare HMO

## 2023-06-09 ENCOUNTER — Ambulatory Visit: Payer: Medicare HMO | Admitting: Physician Assistant

## 2023-06-09 ENCOUNTER — Ambulatory Visit (HOSPITAL_COMMUNITY)
Admission: RE | Admit: 2023-06-09 | Discharge: 2023-06-09 | Disposition: A | Discharge status: Home / Self Care | Payer: Medicare HMO | Source: Ambulatory Visit | Attending: Vascular Surgery | Admitting: Vascular Surgery

## 2023-06-09 VITALS — BP 117/78 | HR 88 | Temp 97.6°F | Resp 22 | Ht 72.0 in | Wt 227.0 lb

## 2023-06-09 DIAGNOSIS — I739 Peripheral vascular disease, unspecified: Secondary | ICD-10-CM

## 2023-06-09 LAB — VAS US ABI WITH/WO TBI
Left ABI: 1.15
Right ABI: 0.61

## 2023-06-09 NOTE — Progress Notes (Signed)

## 2023-06-09 NOTE — Progress Notes (Signed)
HISTORY AND PHYSICAL     CC:  follow up. Requesting Provider:  [name], FNP  HPI: This is a 77 y.o. male who is here today for follow up for PAD.  Pt has hx of  left external iliac artery angioplasty with stent placement on 10/25/2022 by Dr. Chestine Spore.  This was done to improve blood flow so the patient could heal a left second toe amputation on 10/26/2022 by podiatry.  He has a history of left common iliac artery stent, left common femoral endarterectomy, left below-knee popliteal artery and TP trunk endarterectomy, left common femoral to TP trunk bypass with GSV, and left great toe ray amputation on 05/04/2021 by Dr. Chestine Spore.   Pt was last seen 12/07/2022 and at that time, he was doing well and his amputation site was nearly healed.   The pt returns today for follow up.  He states that he hurts all over.  He states his does not really get cramping in his legs with walking.  He does not have any new wounds.  He is compliant with this asa/statin.  He states he needs to get his toenails trimmed and needs to see the foot doctor about his diabetic inserts.   The pt is on a statin for cholesterol management.    The pt is not on an aspirin.    Other AC:  Plavix The pt is on diuretic, BB for hypertension.  The pt is  on medication for diabetes. Tobacco hx:  former    Past Medical History:  Diagnosis Date   Atrial fibrillation (HCC)    Cervical spinal stenosis    DDD (degenerative disc disease), cervical    DDD (degenerative disc disease), lumbar    Diabetes mellitus without complication (HCC)    Dyspnea    Hyperlipidemia    Hypertension    Peripheral artery disease (HCC)     Past Surgical History:  Procedure Laterality Date   ABDOMINAL AORTOGRAM W/LOWER EXTREMITY N/A 04/30/2021   Procedure: ABDOMINAL AORTOGRAM W/LOWER EXTREMITY;  Surgeon: Leonie Douglas, MD;  Location: [facility];  Service: Cardiovascular;  Laterality: N/A;   ABDOMINAL AORTOGRAM W/LOWER EXTREMITY N/A  10/25/2022   Procedure: ABDOMINAL AORTOGRAM W/LOWER EXTREMITY;  Surgeon: [name], MD;  Location: [facility];  Service: Cardiovascular;  Laterality: N/A;   AMPUTATION TOE     AMPUTATION TOE Left 05/04/2021   Procedure: LEFT GREAT TOE AMPUTATION;  Surgeon: [name], MD;  Location: [facility] OR;  Service: Vascular;  Laterality: Left;   AMPUTATION TOE Left 10/26/2022   Procedure: AMPUTATION LEFT SECOND TOE;  Surgeon: Louann Sjogren, DPM;  Location: [facility] OR;  Service: Podiatry;  Laterality: Left;   BYPASS GRAFT FEMORAL-PERONEAL Left 05/04/2021   Procedure: LEFT COMMON FEMORAL-TIBIOPERONEAL TRUNK BYPASS;  Surgeon: [name], MD;  Location: [facility] OR;  Service: Vascular;  Laterality: Left;   ENDARTERECTOMY FEMORAL Left 05/04/2021   Procedure: LEFT COMMON FEMORAL ENDARTERECTOMY WITH BOVINE PATCH;  Surgeon: [name], MD;  Location: [facility] OR;  Service: Vascular;  Laterality: Left;   ENDARTERECTOMY TIBIOPERONEAL Left 05/04/2021   Procedure: LEFT BELOW KNEE POPITEAL WITH  TIBIOPERONEAL ENDARTERECTOMY;  Surgeon: [name], MD;  Location: [facility] OR;  Service: Vascular;  Laterality: Left;   ENDOVEIN HARVEST OF GREATER SAPHENOUS VEIN  05/04/2021   Procedure: HARVEST OF LEFT GREATER SAPHENOUS VEIN;  Surgeon: [name], MD;  Location: [facility] OR;  Service: Vascular;;   INSERTION OF ILIAC STENT Left 05/04/2021   Procedure: LEFT  COMMON ILLIAC ATERY ANGIOPLASTY WITH STENT;  Surgeon: [name], MD;  Location: [facility] OR;  Service: Vascular;  Laterality: Left;   INTRAOPERATIVE ARTERIOGRAM Left 05/04/2021   Procedure: LEFT ARTERY ARTERIOGRAM;  Surgeon: [name], MD;  Location: [facility] OR;  Service: Vascular;  Laterality: Left;   IRRIGATION AND DEBRIDEMENT FOOT Left 05/04/2021   Procedure: IRRIGATION AND DEBRIDEMENT FOOT;  Surgeon: [name], MD;  Location: [facility] OR;  Service: Vascular;  Laterality: Left;   KNEE SURGERY Left    NOSE SURGERY      PERIPHERAL VASCULAR INTERVENTION  10/25/2022   Procedure: PERIPHERAL VASCULAR INTERVENTION;  Surgeon: [name], MD;  Location: [facility];  Service: Cardiovascular;;   TEE WITHOUT CARDIOVERSION N/A 05/01/2021   Procedure: TRANSESOPHAGEAL ECHOCARDIOGRAM (TEE);  Surgeon: Chilton Si, MD;  Location: [facility] ENDOSCOPY;  Service: Cardiovascular;  Laterality: N/A;    Allergies  Allergen Reactions   Asa [Aspirin] Hives   Penicillins Hives    Hives + vomiting as an adult  - tolerated Ceftriaxone 04/2021   Tetanus Toxoid     unknown    Current Outpatient Medications  Medication Sig Dispense Refill   atorvastatin (LIPITOR) 80 MG tablet Take 1 tablet (80 mg total) by mouth daily. 90 tablet 3   Blood Glucose Monitoring Suppl (GNP TRUE METRIX GLUCOSE METER) w/Device KIT      clopidogrel (PLAVIX) 75 MG tablet Take 1 tablet (75 mg total) by mouth daily. 90 tablet 1   dapagliflozin propanediol (FARXIGA) 10 MG TABS tablet Take 1 tablet (10 mg total) by mouth daily before breakfast. 30 tablet 3   ezetimibe (ZETIA) 10 MG tablet Take 1 tablet (10 mg total) by mouth daily. 90 tablet 3   fenofibrate (TRICOR) 145 MG tablet Take 1 tablet (145 mg total) by mouth daily. 90 tablet 1   furosemide (LASIX) 40 MG tablet Take 1 tablet (40 mg total) by mouth 2 (two) times daily. 60 tablet 3   gabapentin (NEURONTIN) 300 MG capsule TAKE ONE CAPSULE BY MOUTH FOUR TIMES DAILY 120 capsule 5   glimepiride (AMARYL) 2 MG tablet Take 1 tablet (2 mg total) by mouth daily before breakfast. 30 tablet 3   glucose blood (TRUE METRIX BLOOD GLUCOSE TEST) test strip CHECK BLOOD SUGAR FOUR TIMES DAILY Dx E11.40 400 each 3   insulin regular (HUMULIN R) 100 units/mL injection INJECT 10 UNITS INTO THE SKIN THREE TIMES DAILY BEFORE MEALS 30 mL 1   Insulin Syringe-Needle U-100 (EASY TOUCH INSULIN SYRINGE) 31G X 5/16" 0.3 ML MISC USE WITH INSULIN EVERY DAY Dx E11.40 100 each 3   Lancets (UNILET MICRO-THIN 33G) MISC CHECK  BLOOD SUGAR FOUR TIMES DAILY Dx E11.40 400 each 3   metFORMIN (GLUCOPHAGE) 500 MG tablet TAKE 1 TABLET BY MOUTH TWICE DAILY 180 tablet 3   metoprolol tartrate (LOPRESSOR) 25 MG tablet Take 1 tablet (25 mg total) by mouth 2 (two) times daily. 180 tablet 3   OZEMPIC, 0.25 OR 0.5 MG/DOSE, 2 MG/3ML SOPN INJECT 0.25 MG SUBCUTANEOUSLY ONCE A WEEK 3 mL 0   pantoprazole (PROTONIX) 40 MG tablet Take 1 tablet (40 mg total) by mouth daily. 90 tablet 1   tamsulosin (FLOMAX) 0.4 MG CAPS capsule TAKE ONE CAPSULE BY MOUTH DAILY 90 capsule 5   No current facility-administered medications for this visit.    Family History  Problem Relation Age of Onset   Stroke Mother    Heart disease Mother        CABG  Cancer Father     Social History   Socioeconomic History   Marital status: Divorced    Spouse name: Not on file   Number of children: 2   Years of education: 11   Highest education level: 11th grade  Occupational History   Occupation: Disabled  Tobacco Use   Smoking status: Former    Current packs/day: 0.00    Average packs/day: 1 pack/day for 45.0 years (45.0 ttl pk-yrs)    Types: Cigarettes    Start date: 06/19/1966    Quit date: 06/20/2011    Years since quitting: 11.9    Passive exposure: Past   Smokeless tobacco: Never  Vaping Use   Vaping status: Never Used  Substance and Sexual Activity   Alcohol use: No    Comment: quit drinking in 1982   Drug use: Not Currently    Comment: marijuana and cocaine in the past, quit in 2012   Sexual activity: Not Currently  Other Topics Concern   Not on file  Social History Narrative   Lives alone - ground level apartment   Social Determinants of Health   Financial Resource Strain: Low Risk  (02/09/2023)   Overall Financial Resource Strain (CARDIA)    Difficulty of Paying Living Expenses: Not hard at all  Food Insecurity: No Food Insecurity (02/09/2023)   Hunger Vital Sign    Worried About Running Out of Food in the Last Year: Never true     Ran Out of Food in the Last Year: Never true  Transportation Needs: No Transportation Needs (02/09/2023)   PRAPARE - Administrator, Civil Service (Medical): No    Lack of Transportation (Non-Medical): No  Physical Activity: Inactive (02/09/2023)   Exercise Vital Sign    Days of Exercise per Week: 0 days    Minutes of Exercise per Session: 0 min  Stress: No Stress Concern Present (02/09/2023)   [employer] of Occupational Health - Occupational Stress Questionnaire    Feeling of Stress : Not at all  Social Connections: Socially Integrated (02/09/2023)   Social Connection and Isolation Panel [NHANES]    Frequency of Communication with Friends and Family: More than three times a week    Frequency of Social Gatherings with Friends and Family: More than three times a week    Attends Religious Services: More than 4 times per year    Active Member of [employer] or Organizations: Yes    Attends Engineer, structural: More than 4 times per year    Marital Status: Married  Recent Concern: Social Connections - Moderately Isolated (12/24/2022)   Social Connection and Isolation Panel [NHANES]    Frequency of Communication with Friends and Family: More than three times a week    Frequency of Social Gatherings with Friends and Family: More than three times a week    Attends Religious Services: More than 4 times per year    Active Member of [employer] or Organizations: No    Attends Banker Meetings: Never    Marital Status: Divorced  Catering manager Violence: Not At Risk (02/09/2023)   Humiliation, Afraid, Rape, and Kick questionnaire    Fear of Current or Ex-Partner: No    Emotionally Abused: No    Physically Abused: No    Sexually Abused: No     REVIEW OF SYSTEMS:   [X]  denotes positive finding, [ ]  denotes negative finding Cardiac  Comments:  Chest pain or chest pressure:    Shortness of breath upon  exertion:    Short of breath when lying flat:    Irregular  heart rhythm: x       Vascular    Pain in calf, thigh, or hip brought on by ambulation:    Pain in feet at night that wakes you up from your sleep:     Blood clot in your veins:    Leg swelling:  x       Pulmonary    Oxygen at home:    Productive cough:     Wheezing:         Neurologic    Sudden weakness in arms or legs:     Sudden numbness in arms or legs:     Sudden onset of difficulty speaking or slurred speech:    Temporary loss of vision in one eye:     Problems with dizziness:         Gastrointestinal    Blood in stool:     Vomited blood:         Genitourinary    Burning when urinating:     Blood in urine:        Psychiatric    Major depression:         Hematologic    Bleeding problems:    Problems with blood clotting too easily:        Skin    Rashes or ulcers:        Constitutional    Fever or chills:      PHYSICAL EXAMINATION:  Today's Vitals   06/09/23 0917 06/09/23 0918  BP: 117/78   Pulse: 88   Resp: (!) 22   Temp: 97.6 F (36.4 C)   TempSrc: Temporal   SpO2: 95%   Weight: 227 lb (103 kg)   Height: 6' (1.829 m)   PainSc: 7  7   PainLoc: Generalized    Body mass index is 30.79 kg/m.   General:  WDWN in NAD; vital signs documented above Gait: Not observed HENT: WNL, normocephalic Pulmonary: normal non-labored breathing , without wheezing Cardiac: irregular HR, without carotid bruits Abdomen: soft, NT; aortic pulse is not palpable Skin: without rashes Vascular Exam/Pulses:  Right Left  Radial 2+ (normal) 2+ (normal)  Femoral 2+ (normal) 2+ (normal)  DP monophasic multiphasic  PT monophasic multiphasic  Peroneal monophasic multiphasic   Extremities: without ischemic changes, without Gangrene , without cellulitis; without open wounds; well healed  Musculoskeletal: no muscle wasting or atrophy  Neurologic: A&O X 3 Psychiatric:  The pt has Normal affect.   Non-Invasive Vascular Imaging:   ABI's/TBI's on 06/09/2023: Right:   0.61/0.51 - Great toe pressure: 74 Left:  1.15 - Great toe pressure: amputated  Arterial duplex on 06/09/2023: Left Stent(s):  +---------------+--------+---------------+--------+--------+  Left EIA stent PSV cm/sStenosis       WaveformComments  +---------------+--------+---------------+--------+--------+  Prox to Stent  131                    biphasic          +---------------+--------+---------------+--------+--------+  Proximal Stent 231     50-99% stenosisbiphasic          +---------------+--------+---------------+--------+--------+  Mid Stent      164                    biphasic          +---------------+--------+---------------+--------+--------+  Distal Stent   145  biphasic          +---------------+--------+---------------+--------+--------+  Distal to Stent133                    biphasic          +---------------+--------+---------------+--------+--------+   Previous ABI's/TBI's on 12/07/2022: Right:  0.68/0.45 - Great toe pressure: 72 Left:  0.99/amp - Great toe pressure:  amp     ASSESSMENT/PLAN:: 77 y.o. male here for follow up for PAD with hx of  hx of  left external iliac artery angioplasty with stent placement on 10/25/2022 by Dr. Chestine Spore.  This was done to improve blood flow so the patient could heal a left second toe amputation on 10/26/2022 by podiatry.  He has a history of left common iliac artery stent, left common femoral endarterectomy, left below-knee popliteal artery and TP trunk endarterectomy, left common femoral to TP trunk bypass with GSV, and left great toe ray amputation on 05/04/2021 by Dr. Chestine Spore.    -pt with multiphasic doppler flow left foot and duplex reveals no stenosis.   -he will continue to protect his feet and f/u with podiatry for his diabetic inserts and toenail trims.   -continue statin/plavix -pt will f/u in 6 months with arterial duplex studies as he does have a stenosis in the proximal stent.  He has easily palpable femoral pulses.  He is asymptomatic and will follow this closely with his next follow up.  He knows to call sooner if any issues before his next visit.  -we will call today to see if podiatry can see him today for his diabetic inserts.   Doreatha Massed, [facility] [phone]  Clinic MD:   Randie Heinz

## 2023-06-13 ENCOUNTER — Other Ambulatory Visit: Payer: Self-pay | Admitting: Family Medicine

## 2023-06-13 DIAGNOSIS — E1140 Type 2 diabetes mellitus with diabetic neuropathy, unspecified: Secondary | ICD-10-CM

## 2023-06-14 ENCOUNTER — Other Ambulatory Visit (HOSPITAL_COMMUNITY)
Admission: RE | Admit: 2023-06-14 | Discharge: 2023-06-14 | Disposition: A | Discharge status: Home / Self Care | Payer: Medicare HMO | Source: Ambulatory Visit | Attending: Family Medicine | Admitting: Family Medicine

## 2023-06-14 DIAGNOSIS — E1169 Type 2 diabetes mellitus with other specified complication: Secondary | ICD-10-CM | POA: Insufficient documentation

## 2023-06-14 DIAGNOSIS — E785 Hyperlipidemia, unspecified: Secondary | ICD-10-CM | POA: Insufficient documentation

## 2023-06-14 DIAGNOSIS — E559 Vitamin D deficiency, unspecified: Secondary | ICD-10-CM | POA: Diagnosis not present

## 2023-06-14 DIAGNOSIS — E038 Other specified hypothyroidism: Secondary | ICD-10-CM | POA: Insufficient documentation

## 2023-06-14 DIAGNOSIS — R7301 Impaired fasting glucose: Secondary | ICD-10-CM | POA: Insufficient documentation

## 2023-06-14 LAB — CBC WITH DIFFERENTIAL/PLATELET
Abs Immature Granulocytes: 0.01 10*3/uL (ref 0.00–0.07)
Basophils Absolute: 0 10*3/uL (ref 0.0–0.1)
Basophils Relative: 1 %
Eosinophils Absolute: 0.1 10*3/uL (ref 0.0–0.5)
Eosinophils Relative: 1 %
HCT: 43.7 % (ref 39.0–52.0)
Hemoglobin: 15.2 g/dL (ref 13.0–17.0)
Immature Granulocytes: 0 %
Lymphocytes Relative: 33 %
Lymphs Abs: 2.2 10*3/uL (ref 0.7–4.0)
MCH: 31.6 pg (ref 26.0–34.0)
MCHC: 34.8 g/dL (ref 30.0–36.0)
MCV: 90.9 fL (ref 80.0–100.0)
Monocytes Absolute: 0.5 10*3/uL (ref 0.1–1.0)
Monocytes Relative: 7 %
Neutro Abs: 3.9 10*3/uL (ref 1.7–7.7)
Neutrophils Relative %: 58 %
Platelets: 215 10*3/uL (ref 150–400)
RBC: 4.81 MIL/uL (ref 4.22–5.81)
RDW: 13.8 % (ref 11.5–15.5)
WBC: 6.6 10*3/uL (ref 4.0–10.5)
nRBC: 0 % (ref 0.0–0.2)

## 2023-06-14 LAB — COMPREHENSIVE METABOLIC PANEL
ALT: 21 U/L (ref 0–44)
AST: 22 U/L (ref 15–41)
Albumin: 4.1 g/dL (ref 3.5–5.0)
Alkaline Phosphatase: 45 U/L (ref 38–126)
Anion gap: 12 (ref 5–15)
BUN: 47 mg/dL — ABNORMAL HIGH (ref 8–23)
CO2: 28 mmol/L (ref 22–32)
Calcium: 9.4 mg/dL (ref 8.9–10.3)
Chloride: 94 mmol/L — ABNORMAL LOW (ref 98–111)
Creatinine, Ser: 2.47 mg/dL — ABNORMAL HIGH (ref 0.61–1.24)
GFR, Estimated: 26 mL/min — ABNORMAL LOW (ref >60)
Glucose, Bld: 163 mg/dL — ABNORMAL HIGH (ref 70–99)
Potassium: 4.2 mmol/L (ref 3.5–5.1)
Sodium: 134 mmol/L — ABNORMAL LOW (ref 135–145)
Total Bilirubin: 0.5 mg/dL (ref 0.3–1.2)
Total Protein: 7.2 g/dL (ref 6.5–8.1)

## 2023-06-14 LAB — LIPID PANEL
Cholesterol: 169 mg/dL (ref 0–200)
HDL: 38 mg/dL — ABNORMAL LOW (ref >40)
LDL Cholesterol: 84 mg/dL (ref 0–99)
Total CHOL/HDL Ratio: 4.4 {ratio}
Triglycerides: 233 mg/dL — ABNORMAL HIGH (ref <150)
VLDL: 47 mg/dL — ABNORMAL HIGH (ref 0–40)

## 2023-06-14 LAB — VITAMIN D 25 HYDROXY (VIT D DEFICIENCY, FRACTURES): Vit D, 25-Hydroxy: 34.52 ng/mL (ref 30–100)

## 2023-06-14 LAB — HEMOGLOBIN A1C
Hgb A1c MFr Bld: 7.7 % — ABNORMAL HIGH (ref 4.8–5.6)
Mean Plasma Glucose: 174.29 mg/dL

## 2023-06-14 LAB — T4, FREE: Free T4: 0.93 ng/dL (ref 0.61–1.12)

## 2023-06-14 LAB — TSH: TSH: 2.731 u[IU]/mL (ref 0.350–4.500)

## 2023-06-19 ENCOUNTER — Other Ambulatory Visit: Payer: Self-pay | Admitting: Family Medicine

## 2023-06-19 DIAGNOSIS — E7849 Other hyperlipidemia: Secondary | ICD-10-CM

## 2023-06-19 MED ORDER — REPATHA SURECLICK 140 MG/ML ~~LOC~~ SOAJ
140.0000 mg | SUBCUTANEOUS | 2 refills | Status: DC
Start: 2023-06-19 — End: 2023-07-18

## 2023-06-19 NOTE — Progress Notes (Signed)
Please congratulate the patient on reducing his hemoglobin A1c from 10.6 to 7.7. I recommend staying on his current treatment regimen and reducing his intake of high-sugar foods and beverages. His LDL cholesterol is not at the goal of less than 55, so I have added Repatha, a subcutaneous injection every 2 weeks, to his treatment regimen to help lower his cholesterol levels. Please encourage the patient to increase his fluid intake and continue to follow up with Dr. Marlou Porch, his nephrologist.

## 2023-06-21 ENCOUNTER — Other Ambulatory Visit: Payer: Self-pay

## 2023-06-21 DIAGNOSIS — I739 Peripheral vascular disease, unspecified: Secondary | ICD-10-CM

## 2023-06-24 DIAGNOSIS — H524 Presbyopia: Secondary | ICD-10-CM | POA: Diagnosis not present

## 2023-06-24 DIAGNOSIS — H35033 Hypertensive retinopathy, bilateral: Secondary | ICD-10-CM | POA: Diagnosis not present

## 2023-06-24 LAB — HM DIABETES EYE EXAM

## 2023-06-30 DIAGNOSIS — Z0100 Encounter for examination of eyes and vision without abnormal findings: Secondary | ICD-10-CM | POA: Diagnosis not present

## 2023-07-09 ENCOUNTER — Other Ambulatory Visit: Payer: Self-pay | Admitting: Family Medicine

## 2023-07-09 DIAGNOSIS — E1140 Type 2 diabetes mellitus with diabetic neuropathy, unspecified: Secondary | ICD-10-CM

## 2023-07-12 ENCOUNTER — Other Ambulatory Visit: Payer: Self-pay | Admitting: Family Medicine

## 2023-07-12 DIAGNOSIS — E1140 Type 2 diabetes mellitus with diabetic neuropathy, unspecified: Secondary | ICD-10-CM

## 2023-07-12 DIAGNOSIS — I10 Essential (primary) hypertension: Secondary | ICD-10-CM

## 2023-07-12 MED ORDER — OZEMPIC (0.25 OR 0.5 MG/DOSE) 2 MG/3ML ~~LOC~~ SOPN
0.5000 mg | PEN_INJECTOR | SUBCUTANEOUS | 0 refills | Status: DC
Start: 2023-07-12 — End: 2023-07-18

## 2023-07-14 DIAGNOSIS — E1122 Type 2 diabetes mellitus with diabetic chronic kidney disease: Secondary | ICD-10-CM | POA: Diagnosis not present

## 2023-07-14 DIAGNOSIS — N184 Chronic kidney disease, stage 4 (severe): Secondary | ICD-10-CM | POA: Diagnosis not present

## 2023-07-14 DIAGNOSIS — I129 Hypertensive chronic kidney disease with stage 1 through stage 4 chronic kidney disease, or unspecified chronic kidney disease: Secondary | ICD-10-CM | POA: Diagnosis not present

## 2023-07-14 DIAGNOSIS — I739 Peripheral vascular disease, unspecified: Secondary | ICD-10-CM | POA: Diagnosis not present

## 2023-07-15 ENCOUNTER — Encounter: Payer: Self-pay | Admitting: Nurse Practitioner

## 2023-07-15 ENCOUNTER — Ambulatory Visit: Payer: Medicare HMO | Attending: Nurse Practitioner | Admitting: Nurse Practitioner

## 2023-07-15 VITALS — BP 128/64 | HR 102 | Ht 72.0 in | Wt 226.0 lb

## 2023-07-15 DIAGNOSIS — Z794 Long term (current) use of insulin: Secondary | ICD-10-CM

## 2023-07-15 DIAGNOSIS — E785 Hyperlipidemia, unspecified: Secondary | ICD-10-CM

## 2023-07-15 DIAGNOSIS — I739 Peripheral vascular disease, unspecified: Secondary | ICD-10-CM | POA: Diagnosis not present

## 2023-07-15 DIAGNOSIS — I10 Essential (primary) hypertension: Secondary | ICD-10-CM

## 2023-07-15 DIAGNOSIS — E669 Obesity, unspecified: Secondary | ICD-10-CM

## 2023-07-15 DIAGNOSIS — E1140 Type 2 diabetes mellitus with diabetic neuropathy, unspecified: Secondary | ICD-10-CM | POA: Diagnosis not present

## 2023-07-15 DIAGNOSIS — I350 Nonrheumatic aortic (valve) stenosis: Secondary | ICD-10-CM | POA: Diagnosis not present

## 2023-07-15 DIAGNOSIS — E781 Pure hyperglyceridemia: Secondary | ICD-10-CM | POA: Diagnosis not present

## 2023-07-15 DIAGNOSIS — N1832 Chronic kidney disease, stage 3b: Secondary | ICD-10-CM

## 2023-07-15 DIAGNOSIS — E7800 Pure hypercholesterolemia, unspecified: Secondary | ICD-10-CM

## 2023-07-15 DIAGNOSIS — N179 Acute kidney failure, unspecified: Secondary | ICD-10-CM

## 2023-07-15 DIAGNOSIS — I4819 Other persistent atrial fibrillation: Secondary | ICD-10-CM

## 2023-07-15 MED ORDER — CLOPIDOGREL BISULFATE 75 MG PO TABS
75.0000 mg | ORAL_TABLET | Freq: Every day | ORAL | 1 refills | Status: DC
Start: 2023-07-15 — End: 2024-01-09

## 2023-07-15 MED ORDER — FENOFIBRATE 145 MG PO TABS: 145.0000 mg | ORAL_TABLET | Freq: Every day | ORAL | 1 refills | Status: DC

## 2023-07-15 NOTE — Patient Instructions (Signed)
Medication Instructions:  Your physician recommends that you continue on your current medications as directed. Please refer to the Current Medication list given to you today.   Labwork: None  Testing/Procedures: None  Follow-Up: Your physician recommends that you schedule a follow-up appointment in: 3-4 months  Any Other Special Instructions Will Be Listed Below (If Applicable). Thank you for choosing [facility]!      If you need a refill on your cardiac medications before your next appointment, please call your pharmacy.

## 2023-07-15 NOTE — Progress Notes (Unsigned)
Cardiology Office Note:    Date: 05/24/2023  ID:  Todd Zhang, DOB 10/07/1945, MRN [id number]  PCP:  Todd Laroche, FNP   [facility] Providers Cardiologist:  Todd Bicker, MD     Referring MD: Todd Laroche, FNP   CC: Here for follow-up  History of Present Illness:    Todd Zhang is a 77 y.o. male with a PMH of the following: PAF, PAD (multiple toe amputations), HLD, HTN, T2DM, frequent falls, peripheral neuropathy, and CKD stage 3b (follows Todd Zhang), who presents today for scheduled follow-up.   Previously was a patient of Todd Zhang of [facility] health cardiology.  Past CV history includes paroxysmal A-fib, s/p cardioversion in 2016.  History of PAD manifested by CLI and had ulceration and is status post left CFA endarterectomy, L below-knee popliteal with TP endarterectomy, left CFA-TP trunk bypass, and left great toe amputation in 2022.  Follows vascular surgery.  Was referred to cardiology services for evaluation of A-fib at the request of his PCP.  Established care with Todd Zhang on July 28, 2022.  At office visit that day he noted palpitations for nearly 1 year, had associated symptom of worsening dyspnea on exertion.  EKG revealed A-fib with RVR, heart rate 111.  Noted dull chest discomfort, lasting an hour or so, random in occurrence and resolving spontaneously, typically occurring about once per month.  Denied any family history of CV disease, and also denied any other cardiac complaints or concerns. BP soft in office that day, Todd Zhang recommended to go to ER for evaluation; however patient refused at that time.  Was started on metoprolol tartrate 25 mg twice daily and Xarelto 15 mg daily was continued.  Lasix was increased from 20 mg daily to 40 mg twice daily for dyspnea on exertion.  Was scheduled for TEE with cardioversion in 2 weeks.  Also started on Zetia 10 mg daily for his history of hyperlipidemia.  Benazepril was switched  to lisinopril 2.5 mg once daily.   While presenting for elective TEE with guided cardioversion, he stated he stopped taking all of his medicines around a week to 2 weeks prior and stated he felt better.  It was found he was in A-fib heart rate ranging from 90-100s.  His insurance had changed at that time - it would not be in complete effect until the next day, therefore his TEE guided with cardioversion canceled and scheduled for outpatient follow-up visit the following week to start low-dose metoprolol and Xarelto.  He noted at that time that he cannot read or write and preferred not to come to [facility] for any procedures unless absolutely necessary.  Todd Zhang stated he will need a case management referral to reach out to insurance company to send prescriptions in pillboxes.  Echocardiogram scheduled for a few weeks after.  08/17/2022 - I initially saw this patient for follow-up evaluation for A-fib. Pt was overall doing well and stated he felt better after stopping all of his medications. He was not taking any medications at the time. Illiterate and only able to identify pills via color, texture, and size.  He was not monitoring his BP, didn't know how to use a BP cuff.  Denied any chest pain, shortness of breath, syncope, presyncope, dizziness, orthopnea, PND, swelling, significant weight changes, or claudication. Did admit to what sounded like occasional palpitations. Noted hx of frequent falls. Todd Zhang was consulted regarding this patient via Secure Chat.  Was restarted on Lopressor 25 mg  twice daily, Xarelto 15 mg daily, and Plavix 75 mg daily.  Referred patient to Case Management.  He was also restarted on Lipitor, Zetia, and Tricor.  I also consulted clinical social worker, Todd Zhang, regarding this patient.   08/27/2022 - Seen for follow-up by Todd Zhang. Due to history of recurrent falls, Xarelto was stopped.  Patient continued to note dyspnea on exertion, stable.  Denied chest  pain or syncope.  Patient also noted forgetfulness.  He stated he had no family support, no one to help him with his medical problems. He hit his head on the ground previously with no indication of ICH.  Even though he met criteria for watchman implantation, was felt he would not likely not benefit due to frequent forgetfulness.   09/03/2022 - He presented to [facility] after sustaining injuries to knee and left rib over a month ago. Did have rib fracture with previous fall. Radiologic films revealed no new rib abnormalities and no acute abnormality of knees.   09/10/22 - Seen for follow-up. Did admit to occasional dizziness. Denied chest pain, shortness of breath, palpitations, syncope, presyncope, orthopnea, PND, swelling, significant weight changes, acute bleeding, or claudication. Did admit to occasional feelings of dizziness. Poor appetite.  Stated he was looking to move, said he would be homeless and probably would have to live in his truck when he leaves his house. BP soft in office that day. Lopressor dosage was reduced. Lisinopril was stopped. A referral was made to establish care with PCP. LCSW was contacted due to patient's reported housing instability.   09/24/2022 - Saw for 2 week follow-up. Patient reported to be living alone, was still undergoing housing instability, high risk for falls, cannot read or write, and did not have a primary care provider. CC was toe on left foot "might need to be amputated," left leg has had some swelling. Hx of several toe amputations bilaterally.  Denied any fever, chills, nausea or vomiting.  Had not established care with a PCP.  Denied any chest pain, shortness of breath, syncope, presyncope, orthopnea, PND, swelling, significant weight changes, or claudication. Denied any recent falls or dizziness. Still admitted to poor appetite. Was under stress d/t housing instability and upcoming appointments. Denied any SI or HI.  I highly recommended several times to  patient to make an urgent referral to podiatry and be seen right away; however patient declined. He was agreeable to be seen for first available appt with new PCP. Referred him to PCP. UC/ED precautions discussed.  10/04/2022 - Saw Todd Zhang for follow-up. Was overall doing well. Admitted to daily left lower extremity pain, along foot at nighttime. Podiatry and Nephrology referrals were made.   Seen at [facility] 10/21/2022 - for SSTI/gangrene of foot. Transferred to [facility] for care. 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  Followed by vascular surgery and podiatry.  Underwent left external iliac artery angioplasty with stent placement.  Subsequently underwent left second toe amputation. PT/OT suggested upon d/c. Pt refused HH therapy.   05/24/23 - Here for follow-up. Patient is a difficult historian d/t forgetfulness and distraction. Admits to generalized aches and pain, does admit to shortness of breath at times, says he gets very tired and worn out when going outside. Describes chest pain but shows difficulty trying to explain this during interview. Describes CP as intermittent, can last for days, not able to tell if noticed with exertion, stated can  notice at rest. Says cough medicine helps his symptoms when taken at night to help him sleep. Says symptoms seem to be getting worse over time. Notices pain with bending down, points along left ribcage. Denies any palpitations, syncope, presyncope, dizziness, orthopnea, PND, or significant weight changes, acute bleeding, or claudication. Admits to abdominal swelling, says he believes his abdomen is "full of fluid." Denies any recent falls. Says "It's hard for me to get back to normal, I don't want to kick the bucket."   Fatigue, nasal congestion, phlegm, Feels as though he is going downhill. "I don't want to kick the bucket." Feels as though he is full of fluid.  Cutting back diet, changing diet, trying to lose weight.  Nephrology workup.....  Drank mini beer... maybe helped kidneys.... didn't have to get up during nght.    Please see the history of present illness.    All other systems reviewed and are negative.  EKGs/Labs/Other Studies Reviewed:    The following studies were reviewed today:  EKG:  EKG is not ordered today.    Vascular Ultrasound duplex 11/2022: Summary:  Left: Patent left common femoral artery to tibioperoneal trunk bypass  graft.  Vascular Ultrasound 11/2022: See report under "CV Proc"  ABI's 11/2022: Summary:  Right: Resting right ankle-brachial index indicates moderate right lower extremity arterial disease. The right toe-brachial index is abnormal.   Left: Resting left ankle-brachial index is within normal range.  Vascular Arterial Duplex 10/2022: Summary:  Left: Since prior study done 08/11/21, waveforms in the bypass graft have  changed from biphasic to monophasic and demonstrates a 50-70% stenosis at  the distal anastomosis.  Echocardiogram on 08/25/2022:  1. Left ventricular ejection fraction, by estimation, is 55 to 60%. The  left ventricle has normal function. The left ventricle has no regional  wall motion abnormalities. There is mild left ventricular hypertrophy.  Left ventricular diastolic function  could not be evaluated due to atrial fibrillation.   2. Right ventricular systolic function was not well visualized. The right  ventricular size is normal.   3. Left atrial size was mildly dilated.   4. The mitral valve is grossly normal. No evidence of mitral valve  regurgitation. No evidence of mitral stenosis.   5. The aortic valve is abnormal. There is moderate calcification of the  aortic valve. Aortic valve regurgitation is not visualized. Mild aortic  valve stenosis.   6. The inferior vena cava is normal in size with greater than 50%  respiratory variability, suggesting right atrial pressure of 3  mmHg.   Comparison(s): Changes from prior study are noted. Stable mild AS.  TEE on 05/01/2021:  1. Left ventricular ejection fraction, by estimation, is 60 to 65%. The  left ventricle has normal function. The left ventricle has no regional  wall motion abnormalities.   2. Right ventricular systolic function is normal. The right ventricular  size is normal.   3. No left atrial/left atrial appendage thrombus was detected.   4. The mitral valve is normal in structure. Mild mitral valve  regurgitation. No evidence of mitral stenosis.   5. 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 Functionally  bicuspid. The aortic valve is calcified. There is moderate calcification  of the aortic valve. There is moderate thickening of the aortic valve.  Aortic valve regurgitation is not  visualized. Mild to moderate aortic valve sclerosis/calcification is  present, without any evidence of aortic stenosis. Aortic valve mean  gradient measures 8.0 mmHg. Aortic valve Vmax measures 1.96 m/s.  6. There is Moderate (Grade III) layered plaque involving the descending  aorta.   7. The inferior vena cava is normal in size with greater than 50%  respiratory variability, suggesting right atrial pressure of 3 mmHg.   Conclusion(s)/Recommendation(s): No evidence of vegetation/infective  endocarditis on this transesophageal  echocardiogram.  2D Echo complete on 04/26/2021: 1. Left ventricular ejection fraction, by estimation, is 60 to 65%. The  left ventricle has normal function. The left ventricle has no regional  wall motion abnormalities. Left ventricular diastolic function could not  be evaluated.   2. Right ventricular systolic function is normal. The right ventricular  size is normal.   3. The mitral valve is normal in structure. No evidence of mitral valve  regurgitation. No evidence of mitral stenosis.   4. The aortic valve has an indeterminant number of cusps. Aortic valve  regurgitation is not  visualized. Mild aortic valve stenosis.   5. The inferior vena cava is dilated in size with >50% respiratory  variability, suggesting right atrial pressure of 8 mmHg.  Risk Assessment/Calculations:    CHA2DS2-VASc Score = 5  This indicates a 7.2% annual risk of stroke. The patient's score is based upon: CHF History: 0 HTN History: 1 Diabetes History: 1 Stroke History: 0 Vascular Disease History: 1 Age Score: 2 Gender Score: 0   Physical Exam:    VS:  There were no vitals taken for this visit.    Wt Readings from Last 3 Encounters:  06/09/23 227 lb (103 kg)  06/02/23 226 lb (102.5 kg)  05/24/23 227 lb 3.2 oz (103.1 kg)     GEN: 77 y.o. male in no acute distress HEENT: Normal NECK: No JVD; No carotid bruits CARDIAC: S1/S2, irregular rhythm and regular rate, no murmurs, rubs, gallops; 2+ peripheral pulses throughout, strong and equal bilaterally RESPIRATORY:  Clear and diminished to auscultation without rales, wheezing or rhonchi  MUSCULOSKELETAL: abdominal swelling, no leg edema.  SKIN: Warm and dry NEUROLOGIC:  Alert and oriented x 3 PSYCHIATRIC:  Normal affect, distracted, forgetful  ASSESSMENT & PLAN:    In order of problems listed above:  Chest pain of uncertain etiology Etiology unclear and patient is difficult historian. Does have multiple risk factors for CAD including T2DM, PAD, HTN, and HLD. Has not undergone ischemic evaluation. Recently elevated sCr trends, would want to avoid CCTA d/t A-fib and CKD. Discussed ischemic evaluation (NST), including risks and benefits and he is agreeable, however pt appears hesitant. Patient requests that I call his family member (listed on [place]) to explain further. Will plan to call patient's family member to discuss plan. However, patient will first require volume optimization as he does show some signs/symptoms of volume overload, prior to ischemic evaluation - see #2 below. Continue atorvastatin, Plavix, Zetia, fenofibrate, and  Lopressor. Heart healthy diet encouraged. Care and ED precautions discussed.  Addendum: Todd Zhang stated, "would not pursue invasive ischemia evaluation due to frequent forgetfulness. NST is fine but not sure about his Afib rates, can re-evaluate his symptoms after he is stablized. Agree with Echo and medical management."  2. Shortness of breath, CHF? Admits to occasional shortness of breath, etiology multifactorial. Weight is up 9 lbs from last office visit. Admits to abdominal fullness and swelling. No JVD on exam - see PE above. Will update limited Echo and will arrange the following bloodwork that patient is agreeable to: BMET, proBNP, and Magnesium. Continue Lasix. Care and ED precautions discussed.  3. Persistent A-fib Denies palpitations or tachycardia. Heart rate well  controlled. Continue Lopressor.  Xarelto previously discontinued due to history of frequent falls.  Not a Watchman device candidate due to frequent forgetfulness. Continue Lopressor, as pt denies recent dizziness. Heart healthy diet encouraged.  Previously placed [facility] care management referral d/t health literacy.    4.  Aortic valve stenosis TTE 08/2022 revealed mild aortic valve stenosis. Updating limited Echo as mentioned above d/t shortness of breath. Heart healthy diet recommended.   3. HTN BP stable. Continue current medication regimen. Previously placed a referral for [facility] Care Management. Heart healthy diet encouraged.   4. PAD Denies any claudication symptoms. Continue Plavix (cannot tolerate ASA).  Underwent left external iliac artery angioplasty with stent placement and underwent left second toe amputation during most recent hospitalization. Continue to follow-up with [facility] and PCP. Continue current medication regimen. Heart healthy diet encouraged.   5. HLD, hypertriglyceridemia Most recent labs revealed total cholesterol 201, HDL 42, LDL 94, and triglycerides 161.  Currently being managed by PCP. Continue  current medication regimen. Heart healthy diet and regular cardiovascular exercise as tolerated encouraged.  6. T2DM Recent hemoglobin A1c (03/2023) was 10.5%.  Continue to follow-up with PCP, who is managing this. Heart healthy, diabetic diet encouraged.  PCP to manage.  7. Hx of frequent falls  Denies any recent falls. No longer on Xarelto d/t hx of frequent falls. I previously offered to refer him to physical therapy to reduce his falls; however, he declined. I previously discussed SNF to patient and he has declined going to SNF for therapy or for long term stay.   8. Illiteracy Cannot read or write. Previously referred to [facility] Care Management.  LCSW has been previously consulted, following.   11. CKD stage 3b sCr baseline is 1.80-2.22. Recent sCr was 1.97 with eGFR at 34. Previously was referred to Nephrology, pt says he does not want to return see Nephrologist. Obtaining BMET as mentioned above. Continue to follow-up with PCP. Avoid nephrotoxic agents.   10. Disposition: Follow-up with APP in 4-6 weeks or sooner if anyone changes. a  Medication Adjustments/Labs and Tests Ordered: Current medicines are reviewed at length with the patient today.  Concerns regarding medicines are outlined above.  No orders of the defined types were placed in this encounter.  No orders of the defined types were placed in this encounter.   There are no Patient Instructions on file for this visit.   Signed, Sharlene Dory, NP

## 2023-07-18 ENCOUNTER — Telehealth: Payer: Self-pay | Admitting: Family Medicine

## 2023-07-18 ENCOUNTER — Other Ambulatory Visit: Payer: Self-pay | Admitting: Family Medicine

## 2023-07-18 ENCOUNTER — Other Ambulatory Visit: Payer: Self-pay

## 2023-07-18 DIAGNOSIS — E7849 Other hyperlipidemia: Secondary | ICD-10-CM

## 2023-07-18 DIAGNOSIS — E1140 Type 2 diabetes mellitus with diabetic neuropathy, unspecified: Secondary | ICD-10-CM

## 2023-07-18 MED ORDER — GLIMEPIRIDE 4 MG PO TABS
4.0000 mg | ORAL_TABLET | Freq: Every day | ORAL | 3 refills | Status: DC
Start: 2023-07-18 — End: 2024-01-30

## 2023-07-18 MED ORDER — REPATHA SURECLICK 140 MG/ML ~~LOC~~ SOAJ: 140.0000 mg | SUBCUTANEOUS | 2 refills | Status: AC

## 2023-07-18 MED ORDER — OZEMPIC (0.25 OR 0.5 MG/DOSE) 2 MG/3ML ~~LOC~~ SOPN: 0.5000 mg | PEN_INJECTOR | SUBCUTANEOUS | 0 refills | Status: AC

## 2023-07-18 NOTE — Telephone Encounter (Signed)
Patient came into the office to speak to nurse he has a piece of paper saying needs to speak to provider, I explain to him she was seeing patients that I would take the paper to her. he said no give it back i will tear it up. about setting up surgery. Teams message sent 11.04.2024 at 1:09 pm

## 2023-07-18 NOTE — Telephone Encounter (Signed)
Spoke with pt , states kidney doctor told him to d/c metformin, and also had questions about his repatha injections , needed a refill for ozempic to be sent to [facility] along with repatha, refills were sent. Forwarding to provider for review.

## 2023-07-18 NOTE — Telephone Encounter (Signed)
Kindly encourage the patient to start taking glimepiride 4 mg daily. The prescription has been sent to his pharmacy.

## 2023-07-19 NOTE — Telephone Encounter (Signed)
Spoke with pt

## 2023-08-14 ENCOUNTER — Other Ambulatory Visit: Payer: Self-pay | Admitting: Family Medicine

## 2023-08-14 ENCOUNTER — Other Ambulatory Visit: Payer: Self-pay | Admitting: Nurse Practitioner

## 2023-08-14 DIAGNOSIS — E7849 Other hyperlipidemia: Secondary | ICD-10-CM

## 2023-08-14 DIAGNOSIS — M5030 Other cervical disc degeneration, unspecified cervical region: Secondary | ICD-10-CM

## 2023-08-14 DIAGNOSIS — G8929 Other chronic pain: Secondary | ICD-10-CM

## 2023-08-17 ENCOUNTER — Ambulatory Visit: Payer: Self-pay | Admitting: *Deleted

## 2023-08-17 ENCOUNTER — Other Ambulatory Visit: Payer: Medicare HMO | Admitting: *Deleted

## 2023-08-17 NOTE — Patient Outreach (Unsigned)
Care Coordination   Initial Visit Note   08/18/2023 Name: Todd Zhang MRN: [id number] DOB: [birth year]  Todd Zhang is a 77 y.o. year old male who sees [name], FNP for primary care. I spoke with sister, Dois Davenport for [facility] LYDY by phone today.  What matters to the patients health and wellness today?  Upset stomach/watery stool, Atrial fibrillation, hypertension, COPD, Diabetes/amputation of left great toe   Stomach pain and diarrhea after eating homemade soup that he had in jar for a month.  Patient drives himself to his local [facility] medical providers  Atrial Fibrillation/hypertension Denies chest pain, headache Has a BP cuff at home   Permission give for RN CM to outreach to his sister, Adria Devon does not see pt much only assists with transportation to [employer] appointments Net pcp visit is on 08/31/23    COPD does not use inhaler a lot Diabetes cbg 152  this morning  Chronic Kidney disease (CKD) urinating better denies concerns   Goals Addressed             This Visit's Progress    CCM (DIABETES) EXPECTED OUTCOME: MONITOR, SELF-MANAGE AND REDUCE SYMPTOMS OF DIABETES   Not on track    Current Barriers:  Knowledge Deficits related to Diabetes management Chronic Disease Management support and education needs related to Diabetes, diet No Advanced Directives in place- pt declines Patient reports he lives alone, can call on his sister [name] if needed, pt states he still drives, is overall independent with his care, has cane and walker on hand but only uses if needed, pt states he has had several falls in the past year. Patient reports he checks CBG "several times per day"  last A1c was 7.7 on 06/14/23 improved from AIC 10.5 on 03/16/23 and pt feels solely due to diet.  Planned Interventions: Reviewed medications with patient and discussed importance of medication adherence;        Reviewed prescribed diet with patient carbohydrate  modified; Counseled on importance of regular laboratory monitoring as prescribed;        Discussed plans with patient for ongoing care management follow up and provided patient with direct contact information for care management team;      Reviewed scheduled/upcoming provider appointments including: primary care provider;         Advised patient, providing education and rationale, to check cbg per MD order  and record        call provider for findings outside established parameters;       Review of patient status, including review of consultants reports, relevant laboratory and other test results, and medications completed;       Reviewed safety precautions  Symptom Management: Take medications as prescribed   Attend all scheduled provider appointments Call pharmacy for medication refills 3-7 days in advance of running out of medications Attend church or other social activities Perform all self care activities independently  Perform IADL's (shopping, preparing meals, housekeeping, managing finances) independently Call provider office for new concerns or questions  check blood sugar at prescribed times: per doctor's order  check feet daily for cuts, sores or redness enter blood sugar readings and medication or insulin into daily log take the blood sugar log to all doctor visits take the blood sugar meter to all doctor visits set goal weight trim toenails straight across fill half of plate with vegetables limit fast food meals to no more than 1 per week prepare main meal at home 3  to 5 days each week read food labels for fat, fiber, carbohydrates and portion size wash and dry feet carefully every day Be mindful of carbohydrate intake with each meal- consider the portion size fall prevention strategies: change position slowly, use assistive device such as walker or cane (per provider recommendations) when walking, keep walkways clear, have good lighting in room. It is important to contact  your provider if you have any falls, maintain muscle strength/tone by exercise per provider recommendations. Interventions Today    Flowsheet Row Most Recent Value  Chronic Disease   Chronic disease during today's visit Diabetes, Chronic Obstructive Pulmonary Disease (COPD), Hypertension (HTN), Atrial Fibrillation (AFib), Chronic Kidney Disease/End Stage Renal Disease (ESRD)  General Interventions   General Interventions Discussed/Reviewed General Interventions Discussed, Labs, Durable Medical Equipment (DME), Doctor Visits  Labs Hgb A1c every 3 months  Doctor Visits Discussed/Reviewed Doctor Visits Discussed, PCP, Specialist  Durable Medical Equipment (DME) BP Cuff, Glucomoter  [cbg this morning 152]  PCP/Specialist Visits Compliance with follow-up visit  Exercise Interventions   Exercise Discussed/Reviewed Exercise Discussed, Physical Activity  Physical Activity Discussed/Reviewed Physical Activity Reviewed  Education Interventions   Education Provided Provided Education  Provided Verbal Education On Nutrition, Foot Care, Blood Sugar Monitoring, When to see the doctor  Mental Health Interventions   Mental Health Discussed/Reviewed Mental Health Discussed, Coping Strategies  Nutrition Interventions   Nutrition Discussed/Reviewed Nutrition Discussed, Portion sizes, Decreasing sugar intake, Fluid intake, Decreasing salt  Pharmacy Interventions   Pharmacy Dicussed/Reviewed Pharmacy Topics Discussed, Medications and their functions, Affording Medications  Safety Interventions   Safety Discussed/Reviewed Safety Discussed, Fall Risk        Follow Up Plan: Telephone follow up appointment with care management team member scheduled for:  09/19/23 at 215 pm          SDOH assessments and interventions completed:  Yes  SDOH Interventions Today    Flowsheet Row Most Recent Value  SDOH Interventions   Food Insecurity Interventions Intervention Not Indicated  Transportation Interventions  Intervention Not Indicated  Utilities Interventions Intervention Not Indicated  Financial Strain Interventions Intervention Not Indicated        Care Coordination Interventions:  Yes, provided   Follow up plan: Follow up call scheduled for 09/19/23    Encounter Outcome:  Patient Visit Completed   Cala Bradford L. Noelle Penner, RN, BSN, [facility]  VBCI Care Management Coordinator  [phone]  Fax: [phone]

## 2023-08-17 NOTE — Patient Outreach (Signed)
  Care Coordination   08/17/2023 Name: Todd Zhang MRN: [id number] DOB: February 27, 1946   Care Coordination Outreach Attempts:  An unsuccessful telephone outreach was attempted today to offer the patient information about available care coordination services.  Follow Up Plan:  Additional outreach attempts will be made to offer the patient care coordination information and services.   Encounter Outcome:  No Answer   Care Coordination Interventions:  Yes, provided Left message for patient at [phone] The saw an EPIC note to call sister Left a message for [name] [phone]     Maryrose Colvin L. Noelle Penner, RN, BSN, [facility]  VBCI Care Management Coordinator  [phone]  Fax: [phone]

## 2023-08-18 NOTE — Patient Instructions (Signed)
Visit Information  Thank you for taking time to visit with me today. Please don't hesitate to contact me if I can be of assistance to you.   Following are the goals we discussed today:   Goals Addressed             This Visit's Progress    CCM (DIABETES) EXPECTED OUTCOME: MONITOR, SELF-MANAGE AND REDUCE SYMPTOMS OF DIABETES   Not on track    Current Barriers:  Knowledge Deficits related to Diabetes management Chronic Disease Management support and education needs related to Diabetes, diet No Advanced Directives in place- pt declines Patient reports he lives alone, can call on his sister [name] if needed, pt states he still drives, is overall independent with his care, has cane and walker on hand but only uses if needed, pt states he has had several falls in the past year. Patient reports he checks CBG "several times per day"  last A1c was 7.7 on 06/14/23 improved from AIC 10.5 on 03/16/23 and pt feels solely due to diet.  Planned Interventions: Reviewed medications with patient and discussed importance of medication adherence;        Reviewed prescribed diet with patient carbohydrate modified; Counseled on importance of regular laboratory monitoring as prescribed;        Discussed plans with patient for ongoing care management follow up and provided patient with direct contact information for care management team;      Reviewed scheduled/upcoming provider appointments including: primary care provider;         Advised patient, providing education and rationale, to check cbg per MD order  and record        call provider for findings outside established parameters;       Review of patient status, including review of consultants reports, relevant laboratory and other test results, and medications completed;       Reviewed safety precautions  Symptom Management: Take medications as prescribed   Attend all scheduled provider appointments Call pharmacy for medication refills 3-7 days  in advance of running out of medications Attend church or other social activities Perform all self care activities independently  Perform IADL's (shopping, preparing meals, housekeeping, managing finances) independently Call provider office for new concerns or questions  check blood sugar at prescribed times: per doctor's order  check feet daily for cuts, sores or redness enter blood sugar readings and medication or insulin into daily log take the blood sugar log to all doctor visits take the blood sugar meter to all doctor visits set goal weight trim toenails straight across fill half of plate with vegetables limit fast food meals to no more than 1 per week prepare main meal at home 3 to 5 days each week read food labels for fat, fiber, carbohydrates and portion size wash and dry feet carefully every day Be mindful of carbohydrate intake with each meal- consider the portion size fall prevention strategies: change position slowly, use assistive device such as walker or cane (per provider recommendations) when walking, keep walkways clear, have good lighting in room. It is important to contact your provider if you have any falls, maintain muscle strength/tone by exercise per provider recommendations. Interventions Today    Flowsheet Row Most Recent Value  Chronic Disease   Chronic disease during today's visit Diabetes, Chronic Obstructive Pulmonary Disease (COPD), Hypertension (HTN), Atrial Fibrillation (AFib), Chronic Kidney Disease/End Stage Renal Disease (ESRD)  General Interventions   General Interventions Discussed/Reviewed General Interventions Discussed, Labs, Durable Medical Equipment (DME), Doctor  Visits  Labs Hgb A1c every 3 months  Doctor Visits Discussed/Reviewed Doctor Visits Discussed, PCP, Specialist  Durable Medical Equipment (DME) BP Cuff, Glucomoter  [cbg this morning 152]  PCP/Specialist Visits Compliance with follow-up visit  Exercise Interventions   Exercise  Discussed/Reviewed Exercise Discussed, Physical Activity  Physical Activity Discussed/Reviewed Physical Activity Reviewed  Education Interventions   Education Provided Provided Education  Provided Verbal Education On Nutrition, Foot Care, Blood Sugar Monitoring, When to see the doctor  Mental Health Interventions   Mental Health Discussed/Reviewed Mental Health Discussed, Coping Strategies  Nutrition Interventions   Nutrition Discussed/Reviewed Nutrition Discussed, Portion sizes, Decreasing sugar intake, Fluid intake, Decreasing salt  Pharmacy Interventions   Pharmacy Dicussed/Reviewed Pharmacy Topics Discussed, Medications and their functions, Affording Medications  Safety Interventions   Safety Discussed/Reviewed Safety Discussed, Fall Risk        Follow Up Plan: Telephone follow up appointment with care management team member scheduled for:  09/19/23 at 215 pm          Our next appointment is by telephone on 09/18/22 at 215  Please call the care guide team at [phone] if you need to cancel or reschedule your appointment.   If you are experiencing a Mental Health or Behavioral Health Crisis or need someone to talk to, please call the Suicide and Crisis Lifeline: 988 call the [place] National Suicide Prevention Lifeline: 910-292-0438 or TTY: 631-201-3346 TTY 760-206-3366) to talk to a trained counselor call 1-800-273-TALK (toll free, 24 hour hotline) call the [facility]: [phone] call 911   No computer access, no preference for copy of AVS    The patient has been provided with contact information for the care management team and has been advised to call with any health related questions or concerns.   Elnora Quizon L. Noelle Penner, RN, BSN, [facility]  VBCI Care Management Coordinator  [phone]  Fax: [phone]

## 2023-08-19 ENCOUNTER — Other Ambulatory Visit: Payer: Self-pay | Admitting: Family Medicine

## 2023-08-19 DIAGNOSIS — E7849 Other hyperlipidemia: Secondary | ICD-10-CM

## 2023-08-31 ENCOUNTER — Ambulatory Visit: Payer: Medicare HMO | Admitting: Family Medicine

## 2023-09-02 ENCOUNTER — Other Ambulatory Visit (HOSPITAL_COMMUNITY): Payer: Self-pay | Admitting: Nephrology

## 2023-09-02 DIAGNOSIS — I10 Essential (primary) hypertension: Secondary | ICD-10-CM

## 2023-09-02 DIAGNOSIS — N184 Chronic kidney disease, stage 4 (severe): Secondary | ICD-10-CM

## 2023-09-02 DIAGNOSIS — E781 Pure hyperglyceridemia: Secondary | ICD-10-CM

## 2023-09-02 DIAGNOSIS — E785 Hyperlipidemia, unspecified: Secondary | ICD-10-CM

## 2023-09-02 DIAGNOSIS — Z794 Long term (current) use of insulin: Secondary | ICD-10-CM

## 2023-09-02 DIAGNOSIS — I739 Peripheral vascular disease, unspecified: Secondary | ICD-10-CM

## 2023-09-02 DIAGNOSIS — E1140 Type 2 diabetes mellitus with diabetic neuropathy, unspecified: Secondary | ICD-10-CM

## 2023-09-10 ENCOUNTER — Other Ambulatory Visit: Payer: Self-pay | Admitting: Nurse Practitioner

## 2023-09-19 ENCOUNTER — Ambulatory Visit: Payer: Self-pay | Admitting: *Deleted

## 2023-09-19 NOTE — Patient Outreach (Signed)
 Care Coordination   Follow Up Visit Note   09/19/2023 Name: Todd Zhang MRN: [id number] DOB: May 29, 1946  Todd Zhang is a 78 y.o. year old male who sees Zarwolo, Gloria, FNP for primary care. I spoke with  Todd Zhang by phone today.  What matters to the patients health and wellness today?  When the weather changes, he reports increase allergy symptoms Has electricity in home that he reports is dry air that does not help with his allergies He agrees to humidifier use Cough/post nasal drainage continues He has allergy medicine but doe not feel it is beneficial He reports his humidifier work some times   Diabetes 100's no 200-300  Had one more diarrhea episode since last RN CM outreach. He states today diarrhea is resolved  Voiced understanding of probiotic use He likes sauerkraut   His support system is his remaining 3 sisters - 2 older and 1 younger    Goals Addressed             This Visit's Progress    CCM (DIABETES) EXPECTED OUTCOME: MONITOR, SELF-MANAGE AND REDUCE SYMPTOMS OF DIABETES       Current Barriers:  Knowledge Deficits related to Diabetes management Chronic Disease Management support and education needs related to Diabetes, diet No Advanced Directives in place- pt declines Patient reports he lives alone, can call on his sister Todd Zhang if needed, pt states he still drives, is overall independent with his care, has cane and walker on hand but only uses if needed, pt states he has had several falls in the past year. Patient reports he checks CBG several times per day  last A1c was 7.7 on 06/14/23 improved from AIC 10.5 on 03/16/23 and pt feels solely due to diet.  Planned Interventions: Reviewed medications with patient and discussed importance of medication adherence;        Reviewed prescribed diet with patient carbohydrate modified; Counseled on importance of regular laboratory monitoring as prescribed;        Discussed plans with patient for  ongoing care management follow up and provided patient with direct contact information for care management team;      Reviewed scheduled/upcoming provider appointments including: primary care provider;         Advised patient, providing education and rationale, to check cbg per MD order  and record        call provider for findings outside established parameters;       Review of patient status, including review of consultants reports, relevant laboratory and other test results, and medications completed;       Reviewed safety precautions  Symptom Management: Take medications as prescribed   Attend all scheduled provider appointments Call pharmacy for medication refills 3-7 days in advance of running out of medications Attend church or other social activities Perform all self care activities independently  Perform IADL's (shopping, preparing meals, housekeeping, managing finances) independently Call provider office for new concerns or questions  check blood sugar at prescribed times: per doctor's order  check feet daily for cuts, sores or redness enter blood sugar readings and medication or insulin  into daily log take the blood sugar log to all doctor visits take the blood sugar meter to all doctor visits set goal weight trim toenails straight across fill half of plate with vegetables limit fast food meals to no more than 1 per week prepare main meal at home 3 to 5 days each week read food labels for fat, fiber, carbohydrates and  portion size wash and dry feet carefully every day Be mindful of carbohydrate intake with each meal- consider the portion size fall prevention strategies: change position slowly, use assistive device such as walker or cane (per provider recommendations) when walking, keep walkways clear, have good lighting in room. It is important to contact your provider if you have any falls, maintain muscle strength/tone by exercise per provider  recommendations   Interventions Today    Flowsheet Row Most Recent Value  Chronic Disease   Chronic disease during today's visit Diabetes, Chronic Obstructive Pulmonary Disease (COPD), Hypertension (HTN), Atrial Fibrillation (AFib), Other  [Diarrhea, runny nose]  General Interventions   General Interventions Discussed/Reviewed General Interventions Reviewed, Sick Day Rules, Doctor Visits  Doctor Visits Discussed/Reviewed Doctor Visits Reviewed, PCP, Specialist  PCP/Specialist Visits Compliance with follow-up visit  Exercise Interventions   Exercise Discussed/Reviewed Physical Activity  [tries to go out of the home if possible - not much in the winter]  Education Interventions   Education Provided Provided Education, Provided Printed Education  Provided Verbal Education On Blood Sugar Monitoring, Medication, Sick Day Rules  [assessed cbg values in 100's, discussed home allergy care- cleaning nare, dust removal, humidity in home]  Mental Health Interventions   Mental Health Discussed/Reviewed Mental Health Reviewed, Coping Strategies  Nutrition Interventions   Nutrition Discussed/Reviewed Nutrition Reviewed, Adding fruits and vegetables  [discussed probiotics to help with bowel management]  Pharmacy Interventions   Pharmacy Dicussed/Reviewed Pharmacy Topics Reviewed, Medications and their functions, Affording Medications  [discussed changes in treatment plan for allergies]              SDOH assessments and interventions completed:  No     Care Coordination Interventions:  Yes, provided   Follow up plan: Follow up call scheduled for 10/04/23    Encounter Outcome:  Patient Visit Completed    Suzen L. Ramonita, RN, BSN, [facility]  VBCI Care Management Coordinator  [phone]  Fax: [phone]

## 2023-09-19 NOTE — Patient Outreach (Signed)
  Care Coordination   09/19/2023 Name: Todd Zhang MRN: [id number] DOB: 06-16-1946   Care Coordination Outreach Attempts:  An unsuccessful outreach was attempted for an appointment today.  Follow Up Plan:  Additional outreach attempts will be made to offer the patient complex care management information and services.   Encounter Outcome:  Patient Request to Call Back At a local store   Care Coordination Interventions:  Yes, provided    Joshlynn Alfonzo L. Ramonita, RN, BSN, [facility]  VBCI Care Management Coordinator  [phone]  Fax: [phone]

## 2023-09-19 NOTE — Patient Instructions (Signed)
 Visit Information  Thank you for taking time to visit with me today. Please don't hesitate to contact me if I can be of assistance to you.   Following are the goals we discussed today:   Goals Addressed             This Visit's Progress    CCM (DIABETES) EXPECTED OUTCOME: MONITOR, SELF-MANAGE AND REDUCE SYMPTOMS OF DIABETES       Current Barriers:  Knowledge Deficits related to Diabetes management Chronic Disease Management support and education needs related to Diabetes, diet No Advanced Directives in place- pt declines Patient reports he lives alone, can call on his sister Nena Lesches if needed, pt states he still drives, is overall independent with his care, has cane and walker on hand but only uses if needed, pt states he has had several falls in the past year. Patient reports he checks CBG several times per day  last A1c was 7.7 on 06/14/23 improved from AIC 10.5 on 03/16/23 and pt feels solely due to diet.  Planned Interventions: Reviewed medications with patient and discussed importance of medication adherence;        Reviewed prescribed diet with patient carbohydrate modified; Counseled on importance of regular laboratory monitoring as prescribed;        Discussed plans with patient for ongoing care management follow up and provided patient with direct contact information for care management team;      Reviewed scheduled/upcoming provider appointments including: primary care provider;         Advised patient, providing education and rationale, to check cbg per MD order  and record        call provider for findings outside established parameters;       Review of patient status, including review of consultants reports, relevant laboratory and other test results, and medications completed;       Reviewed safety precautions  Symptom Management: Take medications as prescribed   Attend all scheduled provider appointments Call pharmacy for medication refills 3-7 days in advance of  running out of medications Attend church or other social activities Perform all self care activities independently  Perform IADL's (shopping, preparing meals, housekeeping, managing finances) independently Call provider office for new concerns or questions  check blood sugar at prescribed times: per doctor's order  check feet daily for cuts, sores or redness enter blood sugar readings and medication or insulin  into daily log take the blood sugar log to all doctor visits take the blood sugar meter to all doctor visits set goal weight trim toenails straight across fill half of plate with vegetables limit fast food meals to no more than 1 per week prepare main meal at home 3 to 5 days each week read food labels for fat, fiber, carbohydrates and portion size wash and dry feet carefully every day Be mindful of carbohydrate intake with each meal- consider the portion size fall prevention strategies: change position slowly, use assistive device such as walker or cane (per provider recommendations) when walking, keep walkways clear, have good lighting in room. It is important to contact your provider if you have any falls, maintain muscle strength/tone by exercise per provider recommendations   Interventions Today    Flowsheet Row Most Recent Value  Chronic Disease   Chronic disease during today's visit Diabetes, Chronic Obstructive Pulmonary Disease (COPD), Hypertension (HTN), Atrial Fibrillation (AFib), Other  [Diarrhea, runny nose]  General Interventions   General Interventions Discussed/Reviewed General Interventions Reviewed, Sick Day Rules, Doctor Visits  Doctor Visits  Discussed/Reviewed Doctor Visits Reviewed, PCP, Specialist  PCP/Specialist Visits Compliance with follow-up visit  Exercise Interventions   Exercise Discussed/Reviewed Physical Activity  [tries to go out of the home if possible - not much in the winter]  Education Interventions   Education Provided Provided Education,  Provided Printed Education  Provided Verbal Education On Blood Sugar Monitoring, Medication, Sick Day Rules  [assessed cbg values in 100's, discussed home allergy care- cleaning nare, dust removal, humidity in home]  Mental Health Interventions   Mental Health Discussed/Reviewed Mental Health Reviewed, Coping Strategies  Nutrition Interventions   Nutrition Discussed/Reviewed Nutrition Reviewed, Adding fruits and vegetables  [discussed probiotics to help with bowel management]  Pharmacy Interventions   Pharmacy Dicussed/Reviewed Pharmacy Topics Reviewed, Medications and their functions, Affording Medications  [discussed changes in treatment plan for allergies]              Our next appointment is by telephone on 10/04/23 at 1:45 pm  Please call the care guide team at [phone] if you need to cancel or reschedule your appointment.   If you are experiencing a Mental Health or Behavioral Health Crisis or need someone to talk to, please call the Suicide and Crisis Lifeline: 988 call the [place]  National Suicide Prevention Lifeline: (207)589-1124 or TTY: (669) 289-7017 TTY 5414766731) to talk to a trained counselor call 1-800-273-TALK (toll free, 24 hour hotline) call the [facility]: [phone] call 911   Patient verbalizes understanding of instructions and care plan provided today and agrees to view in MyChart. Active MyChart status and patient understanding of how to access instructions and care plan via MyChart confirmed with patient.     The patient has been provided with contact information for the care management team and has been advised to call with any health related questions or concerns.   Todd Strick L. Ramonita, RN, BSN, [facility]  VBCI Care Management Coordinator  [phone]  Fax: [phone]

## 2023-09-26 ENCOUNTER — Ambulatory Visit (HOSPITAL_COMMUNITY): Payer: 59 | Attending: Nephrology

## 2023-09-26 ENCOUNTER — Encounter (HOSPITAL_COMMUNITY): Payer: Self-pay

## 2023-10-03 ENCOUNTER — Ambulatory Visit: Payer: 59 | Admitting: Family Medicine

## 2023-10-04 ENCOUNTER — Ambulatory Visit: Payer: Self-pay | Admitting: *Deleted

## 2023-10-04 NOTE — Patient Outreach (Addendum)
Care Coordination   Follow Up Visit Note   10/04/2023 Name: Todd Zhang MRN: [id number] DOB: 03-16-1946  Todd Zhang is a 78 y.o. year old male who sees Todd Laroche, FNP for primary care. I spoke with  Todd Zhang by phone today.  What matters to the patients health and wellness today?  "Cold, under the weather this week"  [name] today he reports what matters to his health and Wellness is that he continues with cold symptoms in which he had during the last RN CM outreach. He is coughing and sneezing throughout this outreach. He reports today that he has brown to white productive phlegm. He previously reported green sputum during the last outreach.  He informs RN CM that he had been "eating sauerkraut" which he reports has helped him with his cold symptoms but has not completely cleared it up. He also confirms that he is getting in on fluids to include water and water with crystal light supplements. He still has postnasal drainage, "a lump in the back of my throat" Also, he has diarrhea and had to conclude this outreach in order to go to the restroom to prevent bowel incontinence  RN CM again discussed the importance of following up with doctor.  He has missed the last two appointments one on January 13 th and one on January the 20 th 2025 with his provider He states he was unable to get to the providers office because of the weather on the 13 th and an un clarified reason on the 68 th. RN CM also discussed the possible need of treatment for his discolored sputum  RN CM also provided further education related to prevention of a COPD Exacerbation before the conclusion of the call.     Goals Addressed             This Visit's Progress    CCM (DIABETES) EXPECTED OUTCOME: MONITOR, SELF-MANAGE AND REDUCE SYMPTOMS OF COPD & DIABETES -RN CM interventions   Not on track    Current Barriers:  Knowledge Deficits related to Diabetes & COPD management Chronic Disease Management  support and education needs related to Diabetes, diet, COPD No Advanced Directives in place- pt declines Patient reports he lives alone, can call on his sister Todd Zhang if needed, pt states he still drives, is overall independent with his care, has cane and walker on hand but only uses if needed, pt states he has had several falls in the past year. Patient reports he checks CBG "several times per day"  last A1c was 7.7 on 06/14/23 improved from AIC 10.5 on 03/16/23 and pt feels solely due to diet.  Planned Interventions: Reviewed medications with patient and discussed importance of medication adherence;        Reviewed prescribed diet with patient carbohydrate modified; Counseled on importance of regular laboratory monitoring as prescribed;        Discussed plans with patient for ongoing care management follow up and provided patient with direct contact information for care management team;      Reviewed scheduled/upcoming provider appointments including: primary care provider;         Advised patient, providing education and rationale, to check cbg per MD order  and record        call provider for findings outside established parameters;       Review of patient status, including review of consultants reports, relevant laboratory and other test results, and medications completed;  Reviewed safety precautions  Symptom Management: Take medications as prescribed   Attend all scheduled provider appointments Call pharmacy for medication refills 3-7 days in advance of running out of medications Attend church or other social activities Perform all self care activities independently  Perform IADL's (shopping, preparing meals, housekeeping, managing finances) independently Call provider office for new concerns or questions  check blood sugar at prescribed times: per doctor's order  check feet daily for cuts, sores or redness enter blood sugar readings and medication or insulin into daily  log take the blood sugar log to all doctor visits take the blood sugar meter to all doctor visits set goal weight trim toenails straight across fill half of plate with vegetables limit fast food meals to no more than 1 per week prepare main meal at home 3 to 5 days each week read food labels for fat, fiber, carbohydrates and portion size wash and dry feet carefully every day Be mindful of carbohydrate intake with each meal- consider the portion size fall prevention strategies: change position slowly, use assistive device such as walker or cane (per provider recommendations) when walking, keep walkways clear, have good lighting in room. It is important to contact your provider if you have any falls, maintain muscle strength/tone by exercise per provider recommendations   Interventions Today    Flowsheet Row Most Recent Value  Chronic Disease   Chronic disease during today's visit Chronic Obstructive Pulmonary Disease (COPD), Other  [Continued "cold" and diarrhea symptoms, missed pcp visits x 2]  General Interventions   General Interventions Discussed/Reviewed General Interventions Reviewed, Doctor Visits, Sick Day Rules, Communication with  Doctor Visits Discussed/Reviewed Doctor Visits Reviewed, PCP  PCP/Specialist Visits Compliance with follow-up visit  Communication with PCP/Specialists  [sent in basket message to pcp & clinical staff via clinical pool for advise if patient could receive a telephonic/virtual pcp outreach related to his continued cold & diarrhea symptoms, missed office visits]  Education Interventions   Education Provided Provided Education  [virtual/telehone pcp visit during inclement weather +]  Provided Verbal Education On Sick Day Rules  Mental Health Interventions   Mental Health Discussed/Reviewed Mental Health Reviewed, Coping Strategies  Nutrition Interventions   Nutrition Discussed/Reviewed Nutrition Reviewed, Fluid intake  Pharmacy Interventions   Pharmacy  Dicussed/Reviewed Pharmacy Topics Reviewed, Medications and their functions              SDOH assessments and interventions completed:  No     Care Coordination Interventions:  Yes, provided   Follow up plan: Follow up call scheduled for 10/11/23    Encounter Outcome:  Patient Visit Completed   Cala Bradford L. Noelle Penner, RN, BSN, CCM [place]  [facility], [facility] Health RN Care Manager Direct Dial: [phone]  Fax: [phone] Mailing Address: [address]  [place] [address] Website: [email or web address]

## 2023-10-04 NOTE — Patient Instructions (Addendum)
Visit Information  Thank you for taking time to visit with me today. Please don't hesitate to contact me if I can be of assistance to you.   Following are the goals we discussed today:   Goals Addressed             This Visit's Progress    CCM (DIABETES) EXPECTED OUTCOME: MONITOR, SELF-MANAGE AND REDUCE SYMPTOMS OF COPD & DIABETES -RN CM interventions   Not on track    Current Barriers:  Knowledge Deficits related to Diabetes & COPD management Chronic Disease Management support and education needs related to Diabetes, diet, COPD No Advanced Directives in place- pt declines Patient reports he lives alone, can call on his sister Todd Zhang if needed, pt states he still drives, is overall independent with his care, has cane and walker on hand but only uses if needed, pt states he has had several falls in the past year. Patient reports he checks CBG "several times per day"  last A1c was 7.7 on 06/14/23 improved from AIC 10.5 on 03/16/23 and pt feels solely due to diet.  Planned Interventions: Reviewed medications with patient and discussed importance of medication adherence;        Reviewed prescribed diet with patient carbohydrate modified; Counseled on importance of regular laboratory monitoring as prescribed;        Discussed plans with patient for ongoing care management follow up and provided patient with direct contact information for care management team;      Reviewed scheduled/upcoming provider appointments including: primary care provider;         Advised patient, providing education and rationale, to check cbg per MD order  and record        call provider for findings outside established parameters;       Review of patient status, including review of consultants reports, relevant laboratory and other test results, and medications completed;       Reviewed safety precautions  Symptom Management: Take medications as prescribed   Attend all scheduled provider appointments Call  pharmacy for medication refills 3-7 days in advance of running out of medications Attend church or other social activities Perform all self care activities independently  Perform IADL's (shopping, preparing meals, housekeeping, managing finances) independently Call provider office for new concerns or questions  check blood sugar at prescribed times: per doctor's order  check feet daily for cuts, sores or redness enter blood sugar readings and medication or insulin into daily log take the blood sugar log to all doctor visits take the blood sugar meter to all doctor visits set goal weight trim toenails straight across fill half of plate with vegetables limit fast food meals to no more than 1 per week prepare main meal at home 3 to 5 days each week read food labels for fat, fiber, carbohydrates and portion size wash and dry feet carefully every day Be mindful of carbohydrate intake with each meal- consider the portion size fall prevention strategies: change position slowly, use assistive device such as walker or cane (per provider recommendations) when walking, keep walkways clear, have good lighting in room. It is important to contact your provider if you have any falls, maintain muscle strength/tone by exercise per provider recommendations   Interventions Today    Flowsheet Row Most Recent Value  Chronic Disease   Chronic disease during today's visit Chronic Obstructive Pulmonary Disease (COPD), Other  [Continued "cold" and diarrhea symptoms, missed pcp visits x 2]  General Interventions   General Interventions Discussed/Reviewed  General Interventions Reviewed, Doctor Visits, Sick Day Rules, Communication with  Doctor Visits Discussed/Reviewed Doctor Visits Reviewed, PCP  PCP/Specialist Visits Compliance with follow-up visit  Communication with PCP/Specialists  [sent in basket message to pcp & clinical staff via clinical pool for advise if patient could receive a telephonic/virtual pcp  outreach related to his continued cold & diarrhea symptoms, missed office visits]  Education Interventions   Education Provided Provided Education  [virtual/telehone pcp visit during inclement weather +]  Provided Verbal Education On Sick Day Rules  Mental Health Interventions   Mental Health Discussed/Reviewed Mental Health Reviewed, Coping Strategies  Nutrition Interventions   Nutrition Discussed/Reviewed Nutrition Reviewed, Fluid intake  Pharmacy Interventions   Pharmacy Dicussed/Reviewed Pharmacy Topics Reviewed, Medications and their functions              Our next appointment is by telephone on 10/11/23 at 1:45 pm  Please call the care guide team at [phone] if you need to cancel or reschedule your appointment.   If you are experiencing a Mental Health or Behavioral Health Crisis or need someone to talk to, please call the Suicide and Crisis Lifeline: 988 call the [place] National Suicide Prevention Lifeline: 810-796-8195 or TTY: (916) 806-2420 TTY 801 589 3395) to talk to a trained counselor call 1-800-273-TALK (toll free, 24 hour hotline) call the [facility]: [phone] call 911   No computer access, no preference for copy of AVS    The patient has been provided with contact information for the care management team and has been advised to call with any health related questions or concerns.   Todd Zhang L. Todd Penner, RN, BSN, CCM [place]  [facility], [facility] Health RN Care Manager Direct Dial: [phone]  Fax: [phone] Mailing Address: [address]  [place] [address] Website: [email or web address]

## 2023-10-10 ENCOUNTER — Other Ambulatory Visit: Payer: Self-pay | Admitting: Family Medicine

## 2023-10-10 DIAGNOSIS — Z794 Long term (current) use of insulin: Secondary | ICD-10-CM

## 2023-10-11 ENCOUNTER — Ambulatory Visit: Payer: Self-pay | Admitting: *Deleted

## 2023-10-11 NOTE — Patient Outreach (Signed)
Care Coordination   Follow Up Visit Note   10/11/2023 Name: Todd Zhang MRN: [id number] DOB: 1945/11/26  Todd Zhang is a 78 y.o. year old male who sees Todd Laroche, FNP for primary care. I spoke with  Todd Zhang by phone today.  What matters to the patients health and wellness today?  "Getting a little better, not enough to brag about"    Escheduling missed appointments March nephrology follow up is 11/16/23, Niece cleaned his home now he is not able to recall where items are Todd Zhang at the pcp office assisted him to get scheduled on 11/11/23 at 1040 with Todd Zhang in the absence of Todd Zhang,  He wants to feel better before being seen at pcp office needs reminding as he has difficulty reading and writing, very familiar with land marks   Goals Addressed             This Visit's Progress    CCM (DIABETES) EXPECTED OUTCOME: MONITOR, SELF-MANAGE AND REDUCE SYMPTOMS OF COPD & DIABETES -RN CM interventions   Not on track    Current Barriers:  Knowledge Deficits related to Diabetes & COPD management Chronic Disease Management support and education needs related to Diabetes, diet, COPD No Advanced Directives in place- pt declines Patient reports he lives alone, can call on his sister Todd Zhang if needed, pt states he still drives, is overall independent with his care, has cane and walker on hand but only uses if needed, pt states he has had several falls in the past year. Patient reports he checks CBG "several times per day"  last A1c was 7.7 on 06/14/23 improved from AIC 10.5 on 03/16/23 and pt feels solely due to diet.  Planned Interventions: Reviewed medications with patient and discussed importance of medication adherence;        Reviewed prescribed diet with patient carbohydrate modified; Counseled on importance of regular laboratory monitoring as prescribed;        Discussed plans with patient for ongoing care management follow up and provided patient with direct  contact information for care management team;      Reviewed scheduled/upcoming provider appointments including: primary care provider;         Advised patient, providing education and rationale, to check cbg per MD order  and record        call provider for findings outside established parameters;       Review of patient status, including review of consultants reports, relevant laboratory and other test results, and medications completed;       Reviewed safety precautions  Symptom Management: Take medications as prescribed   Attend all scheduled provider appointments Call pharmacy for medication refills 3-7 days in advance of running out of medications Attend church or other social activities Perform all self care activities independently  Perform IADL's (shopping, preparing meals, housekeeping, managing finances) independently Call provider office for new concerns or questions  check blood sugar at prescribed times: per doctor's order  check feet daily for cuts, sores or redness enter blood sugar readings and medication or insulin into daily log take the blood sugar log to all doctor visits take the blood sugar meter to all doctor visits set goal weight trim toenails straight across fill half of plate with vegetables limit fast food meals to no more than 1 per week prepare main meal at home 3 to 5 days each week read food labels for fat, fiber, carbohydrates and portion size wash and dry feet carefully every  day Be mindful of carbohydrate intake with each meal- consider the portion size fall prevention strategies: change position slowly, use assistive device such as walker or cane (per provider recommendations) when walking, keep walkways clear, have good lighting in room. It is important to contact your provider if you have any falls, maintain muscle strength/tone by exercise per provider recommendations   Interventions Today    Flowsheet Row Most Recent Value  Chronic Disease    Chronic disease during today's visit Other  [continued cold symptoms, rescheduled missed appointments]  General Interventions   General Interventions Discussed/Reviewed General Interventions Reviewed, Communication with, Doctor Visits  Doctor Visits Discussed/Reviewed Doctor Visits Reviewed, PCP, Specialist  [reviewed specialist and rescheduled pcp visit]  PCP/Specialist Visits Contact provider for referral to  Contacted provider for referral to PCP, Specialist  [rescheduled MD visits reviewed others, confirmed patient with difficulty reading and writing]  Communication with PCP/Specialists, RN  Exercise Interventions   Exercise Discussed/Reviewed Exercise Reviewed, Physical Activity  Physical Activity Discussed/Reviewed Physical Activity Reviewed  Education Interventions   Education Provided Provided Education  [importance of follow up visits,locations of MD offices, ability to see another MD in pcp office in pcp absence]  Provided Verbal Education On [name], Other              SDOH assessments and interventions completed:  No     Care Coordination Interventions:  Yes, provided   Follow up plan: Follow up call scheduled for 11/09/23 1015    Encounter Outcome:  Patient Visit Completed   Cala Bradford L. Noelle Penner, RN, BSN, CCM [place]  [facility], [facility] Health RN Care Manager Direct Dial: [phone]  Fax: [phone] Mailing Address: [address]  [place] [address] Website: [email or web address]

## 2023-10-11 NOTE — Patient Instructions (Addendum)
Visit Information  Thank you for taking time to visit with me today. Please don't hesitate to contact me if I can be of assistance to you.   Following are the goals we discussed today:   Goals Addressed             This Visit's Progress    CCM (DIABETES) EXPECTED OUTCOME: MONITOR, SELF-MANAGE AND REDUCE SYMPTOMS OF COPD & DIABETES -RN CM interventions   Not on track    Current Barriers:  Knowledge Deficits related to Diabetes & COPD management Chronic Disease Management support and education needs related to Diabetes, diet, COPD No Advanced Directives in place- pt declines Patient reports he lives alone, can call on his sister [name] if needed, pt states he still drives, is overall independent with his care, has cane and walker on hand but only uses if needed, pt states he has had several falls in the past year. Patient reports he checks CBG "several times per day"  last A1c was 7.7 on 06/14/23 improved from AIC 10.5 on 03/16/23 and pt feels solely due to diet.  Planned Interventions: Reviewed medications with patient and discussed importance of medication adherence;        Reviewed prescribed diet with patient carbohydrate modified; Counseled on importance of regular laboratory monitoring as prescribed;        Discussed plans with patient for ongoing care management follow up and provided patient with direct contact information for care management team;      Reviewed scheduled/upcoming provider appointments including: primary care provider;         Advised patient, providing education and rationale, to check cbg per MD order  and record        call provider for findings outside established parameters;       Review of patient status, including review of consultants reports, relevant laboratory and other test results, and medications completed;       Reviewed safety precautions  Symptom Management: Take medications as prescribed   Attend all scheduled provider appointments Call  pharmacy for medication refills 3-7 days in advance of running out of medications Attend church or other social activities Perform all self care activities independently  Perform IADL's (shopping, preparing meals, housekeeping, managing finances) independently Call provider office for new concerns or questions  check blood sugar at prescribed times: per doctor's order  check feet daily for cuts, sores or redness enter blood sugar readings and medication or insulin into daily log take the blood sugar log to all doctor visits take the blood sugar meter to all doctor visits set goal weight trim toenails straight across fill half of plate with vegetables limit fast food meals to no more than 1 per week prepare main meal at home 3 to 5 days each week read food labels for fat, fiber, carbohydrates and portion size wash and dry feet carefully every day Be mindful of carbohydrate intake with each meal- consider the portion size fall prevention strategies: change position slowly, use assistive device such as walker or cane (per provider recommendations) when walking, keep walkways clear, have good lighting in room. It is important to contact your provider if you have any falls, maintain muscle strength/tone by exercise per provider recommendations   Interventions Today    Flowsheet Row Most Recent Value  Chronic Disease   Chronic disease during today's visit Other  [continued cold symptoms, rescheduled missed appointments]  General Interventions   General Interventions Discussed/Reviewed General Interventions Reviewed, Communication with, Doctor Visits  Doctor  Visits Discussed/Reviewed Doctor Visits Reviewed, PCP, Specialist  [reviewed specialist and rescheduled pcp visit]  PCP/Specialist Visits Contact provider for referral to  [facility] provider for referral to PCP, Specialist  [rescheduled MD visits reviewed others, confirmed patient with difficulty reading and writing]  Communication with  PCP/Specialists, RN  Exercise Interventions   Exercise Discussed/Reviewed Exercise Reviewed, Physical Activity  Physical Activity Discussed/Reviewed Physical Activity Reviewed  Education Interventions   Education Provided Provided Education  [importance of follow up visits,locations of MD offices, ability to see another MD in pcp office in pcp absence]  Provided Verbal Education On [name], Other              Our next appointment is by telephone on 11/09/23 at 1015  Please call the care guide team at [phone] if you need to cancel or reschedule your appointment.   If you are experiencing a Mental Health or Behavioral Health Crisis or need someone to talk to, please call the Suicide and Crisis Lifeline: 988 call the [place] National Suicide Prevention Lifeline: 435-606-6169 or TTY: 316 739 2559 TTY (680)093-9544) to talk to a trained counselor call 1-800-273-TALK (toll free, 24 hour hotline) call the [facility]: [phone] call 911   No computer access, no preference for copy of AVS      The patient has been provided with contact information for the care management team and has been advised to call with any health related questions or concerns.   Jerriann Schrom L. Noelle Penner, RN, BSN, CCM [place]  [facility], [facility] RN Care Manager Direct Dial: [phone]  Fax: [phone] Mailing Address: [address]  [place] [address] Website: [email or web address]

## 2023-10-13 ENCOUNTER — Ambulatory Visit: Payer: Medicare HMO | Admitting: Internal Medicine

## 2023-11-08 ENCOUNTER — Other Ambulatory Visit: Payer: Self-pay | Admitting: Family Medicine

## 2023-11-08 DIAGNOSIS — K219 Gastro-esophageal reflux disease without esophagitis: Secondary | ICD-10-CM

## 2023-11-08 DIAGNOSIS — I10 Essential (primary) hypertension: Secondary | ICD-10-CM

## 2023-11-09 ENCOUNTER — Ambulatory Visit: Payer: Self-pay | Admitting: *Deleted

## 2023-11-09 NOTE — Patient Outreach (Signed)
 Care Coordination   Follow Up Visit Note   12/17/2023 updated note for 11/09/23  Name: Todd Zhang MRN: [id number] DOB: June 01, 1946  Todd Zhang is a 78 y.o. year old male who sees [name], FNP for primary care. I spoke with  [name] by phone today.  What matters to the patients health and wellness today?  November 11 2023 pending RPC -pcp visit  with continued respiratory symptoms Mr Fiallos informed RN CM when she called to remind him of his 11/11/23 pcp visit,  Stated he was not going to make it because "I'm sick"   With assessment he confirms he continues to have the same ongoing sinus and congestion symptoms. He confirms a history of allergies. Chronic obstructive pulmonary disease (COPD)  is listed on his problem list.  He states he continues to take his sauerkraut & sauerkraut juice.  He reports having to go in and out of his apartment in changing weather.   When RN CM encouraged him not to miss his appointment so that he could get his symptoms evaluated, he stated he could not make it even with RN CM assistance for community resources. He states these symptoms are normal him. His chart does not indicate he is seen by a pulmonologist.   Goals Addressed             This Visit's Progress    CCM (DIABETES) EXPECTED OUTCOME: MONITOR, SELF-MANAGE AND REDUCE SYMPTOMS OF COPD & DIABETES -RN CM interventions       Current Barriers:  Knowledge Deficits related to Diabetes & COPD management Chronic Disease Management support and education needs related to Diabetes, diet, COPD No Advanced Directives in place- pt declines Patient reports he lives alone, can call on his sister [name] if needed, pt states he still drives, is overall independent with his care, has cane and walker on hand but only uses if needed, pt states he has had several falls in the past year. Patient reports he checks CBG "several times per day"  last A1c was 7.7 on 06/14/23 improved from AIC 10.5 on 03/16/23  and pt feels solely due to diet.  Planned Interventions: Reviewed medications with patient and discussed importance of medication adherence;        Reviewed prescribed diet with patient carbohydrate modified; Counseled on importance of regular laboratory monitoring as prescribed;        Discussed plans with patient for ongoing care management follow up and provided patient with direct contact information for care management team;      Reviewed scheduled/upcoming provider appointments including: primary care provider;         Advised patient, providing education and rationale, to check cbg per MD order  and record        call provider for findings outside established parameters;       Review of patient status, including review of consultants reports, relevant laboratory and other test results, and medications completed;       Reviewed safety precautions  Symptom Management: Take medications as prescribed   Attend all scheduled provider appointments Call pharmacy for medication refills 3-7 days in advance of running out of medications Attend church or other social activities Perform all self care activities independently  Perform IADL's (shopping, preparing meals, housekeeping, managing finances) independently Call provider office for new concerns or questions  check blood sugar at prescribed times: per doctor's order  check feet daily for cuts, sores or redness enter blood sugar readings and medication  or insulin into daily log take the blood sugar log to all doctor visits take the blood sugar meter to all doctor visits set goal weight trim toenails straight across fill half of plate with vegetables limit fast food meals to no more than 1 per week prepare main meal at home 3 to 5 days each week read food labels for fat, fiber, carbohydrates and portion size wash and dry feet carefully every day Be mindful of carbohydrate intake with each meal- consider the portion size fall  prevention strategies: change position slowly, use assistive device such as walker or cane (per provider recommendations) when walking, keep walkways clear, have good lighting in room. It is important to contact your provider if you have any falls, maintain muscle strength/tone by exercise per provider recommendations   Interventions Today    Flowsheet Row Most Recent Value  Chronic Disease   Chronic disease during today's visit Other  [Reminded of 11/11/23 scheduled pcp office visit, Encouraged medical services for continued allergy symptoms]  General Interventions   General Interventions Discussed/Reviewed General Interventions Reviewed, Sick Day Rules, Doctor Visits  Doctor Visits Discussed/Reviewed Doctor Visits Reviewed, PCP, Specialist  [discussed the importance of seeking medical services for ongoing unresolved allergy symptoms.]  PCP/Specialist Visits Contact provider for referral to  [non complliance with attending pcp office visits.]  Contacted provider for referral to PCP, Specialist  Communication with PCP/Specialists  Exercise Interventions   Exercise Discussed/Reviewed Exercise Reviewed, Physical Activity  Education Interventions   Education Provided Provided Education  Provided Verbal Education On Nutrition, Medication, When to see the doctor, Community Resources  Mental Health Interventions   Mental Health Discussed/Reviewed Mental Health Reviewed, Coping Strategies  Nutrition Interventions   Nutrition Discussed/Reviewed Nutrition Reviewed, Adding fruits and vegetables, Fluid intake, Decreasing sugar intake, Decreasing salt, Increasing proteins  Pharmacy Interventions   Pharmacy Dicussed/Reviewed Pharmacy Topics Reviewed, Affording Medications  Safety Interventions   Safety Discussed/Reviewed Safety Reviewed, Fall Risk, Home Safety  Home Safety Assistive Devices  Advanced Directive Interventions   Advanced Directives Discussed/Reviewed Advanced Directives Discussed   [declined]              SDOH assessments and interventions completed:  No     Care Coordination Interventions:  Yes, provided   Follow up plan: No further intervention required.   Encounter Outcome:  Patient Visit Completed   Cala Bradford L. Noelle Penner, RN, BSN, CCM   [facility], [facility] Health RN Care Manager Direct Dial: [phone]  Fax: [phone]

## 2023-11-10 NOTE — Progress Notes (Unsigned)
   Established Patient Office Visit   Subjective  Patient ID: URA HAUSEN, male    DOB: [birth year]  Age: 78 y.o. MRN: [id number]  No chief complaint on file.   He  has a past medical history of Atrial fibrillation (HCC), Cervical spinal stenosis, DDD (degenerative disc disease), cervical, DDD (degenerative disc disease), lumbar, Diabetes mellitus without complication (HCC), Dyspnea, Hyperlipidemia, Hypertension, and Peripheral artery disease (HCC).  HPI  ROS    Objective:     There were no vitals taken for this visit. {Vitals History (Optional):23777}  Physical Exam   No results found for any visits on 11/11/23.  The ASCVD Risk score (Arnett DK, et al., 2019) failed to calculate for the following reasons:   Risk score cannot be calculated because patient has a medical history suggesting prior/existing ASCVD    Assessment & Plan:  There are no diagnoses linked to this encounter.  No follow-ups on file.   Cruzita Lederer Newman Nip, FNP

## 2023-11-10 NOTE — Patient Instructions (Signed)

## 2023-11-11 ENCOUNTER — Ambulatory Visit: Payer: Self-pay | Admitting: Family Medicine

## 2023-11-18 ENCOUNTER — Other Ambulatory Visit: Payer: Self-pay | Admitting: Family Medicine

## 2023-11-29 ENCOUNTER — Ambulatory Visit: Payer: Self-pay | Admitting: Internal Medicine

## 2023-12-08 ENCOUNTER — Other Ambulatory Visit: Payer: Self-pay | Admitting: Family Medicine

## 2023-12-08 DIAGNOSIS — E785 Hyperlipidemia, unspecified: Secondary | ICD-10-CM

## 2023-12-13 ENCOUNTER — Ambulatory Visit (HOSPITAL_COMMUNITY): Payer: Medicare HMO

## 2023-12-13 ENCOUNTER — Ambulatory Visit: Payer: Medicare HMO

## 2023-12-17 NOTE — Patient Instructions (Signed)
 Visit Information  Thank you for taking time to visit with me today. Please don't hesitate to contact me if I can be of assistance to you.   Following are the goals we discussed today:   Goals Addressed             This Visit's Progress    CCM (DIABETES) EXPECTED OUTCOME: MONITOR, SELF-MANAGE AND REDUCE SYMPTOMS OF COPD & DIABETES -RN CM interventions       Current Barriers:  Knowledge Deficits related to Diabetes & COPD management Chronic Disease Management support and education needs related to Diabetes, diet, COPD No Advanced Directives in place- pt declines Patient reports he lives alone, can call on his sister [name] if needed, pt states he still drives, is overall independent with his care, has cane and walker on hand but only uses if needed, pt states he has had several falls in the past year. Patient reports he checks CBG "several times per day"  last A1c was 7.7 on 06/14/23 improved from AIC 10.5 on 03/16/23 and pt feels solely due to diet.  Planned Interventions: Reviewed medications with patient and discussed importance of medication adherence;        Reviewed prescribed diet with patient carbohydrate modified; Counseled on importance of regular laboratory monitoring as prescribed;        Discussed plans with patient for ongoing care management follow up and provided patient with direct contact information for care management team;      Reviewed scheduled/upcoming provider appointments including: primary care provider;         Advised patient, providing education and rationale, to check cbg per MD order  and record        call provider for findings outside established parameters;       Review of patient status, including review of consultants reports, relevant laboratory and other test results, and medications completed;       Reviewed safety precautions  Symptom Management: Take medications as prescribed   Attend all scheduled provider appointments Call pharmacy for  medication refills 3-7 days in advance of running out of medications Attend church or other social activities Perform all self care activities independently  Perform IADL's (shopping, preparing meals, housekeeping, managing finances) independently Call provider office for new concerns or questions  check blood sugar at prescribed times: per doctor's order  check feet daily for cuts, sores or redness enter blood sugar readings and medication or insulin into daily log take the blood sugar log to all doctor visits take the blood sugar meter to all doctor visits set goal weight trim toenails straight across fill half of plate with vegetables limit fast food meals to no more than 1 per week prepare main meal at home 3 to 5 days each week read food labels for fat, fiber, carbohydrates and portion size wash and dry feet carefully every day Be mindful of carbohydrate intake with each meal- consider the portion size fall prevention strategies: change position slowly, use assistive device such as walker or cane (per provider recommendations) when walking, keep walkways clear, have good lighting in room. It is important to contact your provider if you have any falls, maintain muscle strength/tone by exercise per provider recommendations   Interventions Today    Flowsheet Row Most Recent Value  Chronic Disease   Chronic disease during today's visit Other  [Reminded of 11/11/23 scheduled pcp office visit, Encouraged medical services for continued allergy symptoms]  General Interventions   General Interventions Discussed/Reviewed General Interventions Reviewed,  Sick Day Rules, Doctor Visits  Doctor Visits Discussed/Reviewed Doctor Visits Reviewed, PCP, Specialist  [discussed the importance of seeking medical services for ongoing unresolved allergy symptoms.]  PCP/Specialist Visits Contact provider for referral to  [non complliance with attending pcp office visits.]  Contacted provider for referral to  PCP, Specialist  Communication with PCP/Specialists  Exercise Interventions   Exercise Discussed/Reviewed Exercise Reviewed, Physical Activity  Education Interventions   Education Provided Provided Education  Provided Verbal Education On Nutrition, Medication, When to see the doctor, Community Resources  Mental Health Interventions   Mental Health Discussed/Reviewed Mental Health Reviewed, Coping Strategies  Nutrition Interventions   Nutrition Discussed/Reviewed Nutrition Reviewed, Adding fruits and vegetables, Fluid intake, Decreasing sugar intake, Decreasing salt, Increasing proteins  Pharmacy Interventions   Pharmacy Dicussed/Reviewed Pharmacy Topics Reviewed, Affording Medications  Safety Interventions   Safety Discussed/Reviewed Safety Reviewed, Fall Risk, Home Safety  Home Safety Assistive Devices  Advanced Directive Interventions   Advanced Directives Discussed/Reviewed Advanced Directives Discussed  [declined]              Our next appointment is no further scheduled appointments.  On as needed at -  Please call the care guide team at [phone] if you need to cancel or reschedule your appointment.   If you are experiencing a Mental Health or Behavioral Health Crisis or need someone to talk to, please call the Suicide and Crisis Lifeline: 988 call the [place] National Suicide Prevention Lifeline: 786-871-5292 or TTY: 812-566-8317 TTY 408-525-2136) to talk to a trained counselor call 1-800-273-TALK (toll free, 24 hour hotline) call the [facility]: [phone] call 911   The patient verbalized understanding of instructions, educational materials, and care plan provided today and DECLINED offer to receive copy of patient instructions, educational materials, and care plan.   The patient has been provided with contact information for the care management team and has been advised to call with any health related questions or concerns.    Treshon Stannard L.  Noelle Penner, RN, BSN, CCM [place]  [facility], [facility] RN Care Manager Direct Dial: [phone]  Fax: [phone]

## 2023-12-29 ENCOUNTER — Ambulatory Visit: Payer: Self-pay | Admitting: Family Medicine

## 2024-01-04 ENCOUNTER — Telehealth: Payer: Self-pay | Admitting: Family Medicine

## 2024-01-04 NOTE — Telephone Encounter (Signed)
 Copied from CRM [id number]. Topic: Appointments - Scheduling Inquiry for Clinic >> Jan 04, 2024  3:59 PM Antwanette L wrote: Reason for CRM: I accidentally scheduled the pt appt with Alison Irvine on 4/28. The appt was cancelled a r/s to 7/3 with Gloria Zarwolo. I called the pt back and lvm.

## 2024-01-09 ENCOUNTER — Other Ambulatory Visit: Payer: Self-pay | Admitting: Nurse Practitioner

## 2024-01-09 ENCOUNTER — Other Ambulatory Visit: Payer: Self-pay | Admitting: Family Medicine

## 2024-01-09 ENCOUNTER — Ambulatory Visit: Payer: Self-pay

## 2024-01-09 DIAGNOSIS — I10 Essential (primary) hypertension: Secondary | ICD-10-CM

## 2024-01-09 DIAGNOSIS — I739 Peripheral vascular disease, unspecified: Secondary | ICD-10-CM

## 2024-01-13 ENCOUNTER — Ambulatory Visit (INDEPENDENT_AMBULATORY_CARE_PROVIDER_SITE_OTHER): Admitting: Family Medicine

## 2024-01-13 ENCOUNTER — Encounter: Payer: Self-pay | Admitting: Family Medicine

## 2024-01-13 VITALS — BP 134/73 | HR 75 | Ht 72.0 in | Wt 228.1 lb

## 2024-01-13 DIAGNOSIS — Z794 Long term (current) use of insulin: Secondary | ICD-10-CM | POA: Diagnosis not present

## 2024-01-13 DIAGNOSIS — E1159 Type 2 diabetes mellitus with other circulatory complications: Secondary | ICD-10-CM

## 2024-01-13 DIAGNOSIS — E782 Mixed hyperlipidemia: Secondary | ICD-10-CM

## 2024-01-13 DIAGNOSIS — E1169 Type 2 diabetes mellitus with other specified complication: Secondary | ICD-10-CM | POA: Diagnosis not present

## 2024-01-13 DIAGNOSIS — I10 Essential (primary) hypertension: Secondary | ICD-10-CM

## 2024-01-13 DIAGNOSIS — E1140 Type 2 diabetes mellitus with diabetic neuropathy, unspecified: Secondary | ICD-10-CM | POA: Diagnosis not present

## 2024-01-13 NOTE — Patient Instructions (Signed)
I appreciate the opportunity to provide care to you today!    Follow up:  4 months  Labs: please stop by the lab during the week to get your blood drawn (CBC, CMP, TSH, Lipid profile, HgA1c, Vit D)   Here are some foods to avoid or reduce in your diet to help manage cholesterol levels:  Fried Foods:Deep-fried items such as french fries, fried chicken, and fried snacks are high in unhealthy fats and can raise LDL (bad) cholesterol levels. Processed Meats:Foods like bacon, sausage, hot dogs, and deli meats are often high in saturated fat and cholesterol. Full-Fat Dairy Products:Whole milk, full-fat yogurt, butter, cream, and cheese are rich in saturated fats, which can increase cholesterol levels. Baked Goods and Sweets:Pastries, cakes, cookies, and donuts often contain trans fats and added sugars, which can raise LDL cholesterol and lower HDL (good) cholesterol. Red Meat:Beef, lamb, and pork are high in saturated fat. Lean cuts or plant-based protein alternatives are better options. Lard and Shortening:Used in some baked goods, lard and shortening are high in trans fats and should be avoided. Fast Food:Many fast food items are cooked with unhealthy oils and contain high amounts of saturated and trans fats. Processed Snacks:Chips, crackers, and certain microwave popcorns can contain trans fats and high levels of unhealthy oils. Shellfish:While nutritious in other ways, some shellfish like shrimp, lobster, and crab are high in cholesterol. They should be consumed in moderation. Coconut and Palm Oils:these oils are high in saturated fat and can raise cholesterol levels when used in cooking or baking.      Please continue to a heart-healthy diet and increase your physical activities. Try to exercise for at least five days a week.    It was a pleasure to see you and I look forward to continuing to work together on your health and well-being. Please do not hesitate to call the office if you  need care or have questions about your care.  In case of emergency, please visit the Emergency Department for urgent care, or contact our clinic at [phone] to schedule an appointment. We're here to help you!   Have a wonderful day and week. With Gratitude, [name] MSN, FNP-BC

## 2024-01-13 NOTE — Progress Notes (Signed)
 Established Patient Office Visit  Subjective:  Patient ID: Todd Zhang, male    DOB: 1945/10/20  Age: 78 y.o. MRN: [id number]  CC: No chief complaint on file.   HPI Todd Zhang is a 78 y.o. male with past medical history of essential hypertension, type II diabetes and hyperlipidemia presents for f/u of  chronic medical conditions. For the details of today's visit, please refer to the assessment and plan.     Past Medical History:  Diagnosis Date   Atrial fibrillation (HCC)    Cervical spinal stenosis    DDD (degenerative disc disease), cervical    DDD (degenerative disc disease), lumbar    Diabetes mellitus without complication (HCC)    Dyspnea    Hyperlipidemia    Hypertension    Peripheral artery disease (HCC)     Past Surgical History:  Procedure Laterality Date   ABDOMINAL AORTOGRAM W/LOWER EXTREMITY N/A 04/30/2021   Procedure: ABDOMINAL AORTOGRAM W/LOWER EXTREMITY;  Surgeon: Carlene Che, MD;  Location: [facility];  Service: Cardiovascular;  Laterality: N/A;   ABDOMINAL AORTOGRAM W/LOWER EXTREMITY N/A 10/25/2022   Procedure: ABDOMINAL AORTOGRAM W/LOWER EXTREMITY;  Surgeon: Young Hensen, MD;  Location: [facility];  Service: Cardiovascular;  Laterality: N/A;   AMPUTATION TOE     AMPUTATION TOE Left 05/04/2021   Procedure: LEFT GREAT TOE AMPUTATION;  Surgeon: Young Hensen, MD;  Location: [facility] OR;  Service: Vascular;  Laterality: Left;   AMPUTATION TOE Left 10/26/2022   Procedure: AMPUTATION LEFT SECOND TOE;  Surgeon: Jennefer Moats, DPM;  Location: [facility] OR;  Service: Podiatry;  Laterality: Left;   BYPASS GRAFT FEMORAL-PERONEAL Left 05/04/2021   Procedure: LEFT COMMON FEMORAL-TIBIOPERONEAL TRUNK BYPASS;  Surgeon: Young Hensen, MD;  Location: [facility] OR;  Service: Vascular;  Laterality: Left;   ENDARTERECTOMY FEMORAL Left 05/04/2021   Procedure: LEFT COMMON FEMORAL ENDARTERECTOMY WITH BOVINE PATCH;  Surgeon: Young Hensen, MD;   Location: [facility] OR;  Service: Vascular;  Laterality: Left;   ENDARTERECTOMY TIBIOPERONEAL Left 05/04/2021   Procedure: LEFT BELOW KNEE POPITEAL WITH  TIBIOPERONEAL ENDARTERECTOMY;  Surgeon: Young Hensen, MD;  Location: [facility] OR;  Service: Vascular;  Laterality: Left;   ENDOVEIN HARVEST OF GREATER SAPHENOUS VEIN  05/04/2021   Procedure: HARVEST OF LEFT GREATER SAPHENOUS VEIN;  Surgeon: Young Hensen, MD;  Location: [facility] OR;  Service: Vascular;;   INSERTION OF ILIAC STENT Left 05/04/2021   Procedure: LEFT COMMON ILLIAC ATERY ANGIOPLASTY WITH STENT;  Surgeon: Young Hensen, MD;  Location: [facility] OR;  Service: Vascular;  Laterality: Left;   INTRAOPERATIVE ARTERIOGRAM Left 05/04/2021   Procedure: LEFT ARTERY ARTERIOGRAM;  Surgeon: Young Hensen, MD;  Location: [facility] OR;  Service: Vascular;  Laterality: Left;   IRRIGATION AND DEBRIDEMENT FOOT Left 05/04/2021   Procedure: IRRIGATION AND DEBRIDEMENT FOOT;  Surgeon: Young Hensen, MD;  Location: [facility] OR;  Service: Vascular;  Laterality: Left;   KNEE SURGERY Left    NOSE SURGERY     PERIPHERAL VASCULAR INTERVENTION  10/25/2022   Procedure: PERIPHERAL VASCULAR INTERVENTION;  Surgeon: Young Hensen, MD;  Location: [facility];  Service: Cardiovascular;;   TEE WITHOUT CARDIOVERSION N/A 05/01/2021   Procedure: TRANSESOPHAGEAL ECHOCARDIOGRAM (TEE);  Surgeon: Maudine Sos, MD;  Location: [facility] ENDOSCOPY;  Service: Cardiovascular;  Laterality: N/A;    Family History  Problem Relation Age of Onset   Stroke Mother    Heart disease Mother        CABG  Cancer Father     Social History   Socioeconomic History   Marital status: Divorced    Spouse name: Not on file   Number of children: 2   Years of education: 11   Highest education level: 11th grade  Occupational History   Occupation: Disabled  Tobacco Use   Smoking status: Former    Current packs/day: 0.00    Average packs/day: 1 pack/day for 45.0 years (45.0 ttl  pk-yrs)    Types: Cigarettes    Start date: 06/19/1966    Quit date: 06/20/2011    Years since quitting: 12.5    Passive exposure: Past   Smokeless tobacco: Never  Vaping Use   Vaping status: Never Used  Substance and Sexual Activity   Alcohol use: No    Comment: quit drinking in 1982   Drug use: Not Currently    Comment: marijuana and cocaine in the past, quit in 2012   Sexual activity: Not Currently  Other Topics Concern   Not on file  Social History Narrative   Lives alone - ground level apartment   Social Drivers of Health   Financial Resource Strain: Low Risk  (08/18/2023)   Overall Financial Resource Strain (CARDIA)    Difficulty of Paying Living Expenses: Not hard at all  Food Insecurity: No Food Insecurity (08/18/2023)   Hunger Vital Sign    Worried About Running Out of Food in the Last Year: Never true    Ran Out of Food in the Last Year: Never true  Transportation Needs: No Transportation Needs (08/18/2023)   PRAPARE - Administrator, Civil Service (Medical): No    Lack of Transportation (Non-Medical): No  Physical Activity: Inactive (02/09/2023)   Exercise Vital Sign    Days of Exercise per Week: 0 days    Minutes of Exercise per Session: 0 min  Stress: No Stress Concern Present (02/09/2023)   [employer] of Occupational Health - Occupational Stress Questionnaire    Feeling of Stress : Not at all  Social Connections: Socially Integrated (02/09/2023)   Social Connection and Isolation Panel [NHANES]    Frequency of Communication with Friends and Family: More than three times a week    Frequency of Social Gatherings with Friends and Family: More than three times a week    Attends Religious Services: More than 4 times per year    Active Member of [employer] or Organizations: Yes    Attends Engineer, structural: More than 4 times per year    Marital Status: Married  Recent Concern: Social Connections - Moderately Isolated (12/24/2022)   Social  Connection and Isolation Panel [NHANES]    Frequency of Communication with Friends and Family: More than three times a week    Frequency of Social Gatherings with Friends and Family: More than three times a week    Attends Religious Services: More than 4 times per year    Active Member of [employer] or Organizations: No    Attends Banker Meetings: Never    Marital Status: Divorced  Catering manager Violence: Not At Risk (02/09/2023)   Humiliation, Afraid, Rape, and Kick questionnaire    Fear of Current or Ex-Partner: No    Emotionally Abused: No    Physically Abused: No    Sexually Abused: No    Outpatient Medications Prior to Visit  Medication Sig Dispense Refill   atorvastatin  (LIPITOR) 80 MG tablet TAKE 1 TABLET BY MOUTH DAILY 90 tablet 3   Blood Glucose  Monitoring Suppl (GNP TRUE METRIX GLUCOSE METER) w/Device KIT      clopidogrel  (PLAVIX ) 75 MG tablet TAKE 1 TABLET BY MOUTH DAILY 90 tablet 1   Evolocumab  (REPATHA  SURECLICK) 140 MG/ML SOAJ Inject 140 mg into the skin every 14 (fourteen) days. 2 mL 2   ezetimibe  (ZETIA ) 10 MG tablet TAKE 1 TABLET BY MOUTH EVERY DAY 90 tablet 3   FARXIGA  10 MG TABS tablet TAKE 1 TABLET BY MOUTH DAILY BEFORE BREAKFAST 30 tablet 3   fenofibrate  (TRICOR ) 145 MG tablet Take 1 tablet (145 mg total) by mouth daily. 90 tablet 1   furosemide  (LASIX ) 40 MG tablet TAKE 1 TABLET BY MOUTH TWICE DAILY 60 tablet 1   gabapentin  (NEURONTIN ) 300 MG capsule TAKE ONE CAPSULE BY MOUTH FOUR TIMES DAILY 120 capsule 5   glimepiride  (AMARYL ) 2 MG tablet TAKE 1 TABLET BY MOUTH DAILY BEFORE BREAKFAST 30 tablet 3   glimepiride  (AMARYL ) 4 MG tablet Take 1 tablet (4 mg total) by mouth daily before breakfast. 30 tablet 3   glucose blood (TRUE METRIX BLOOD GLUCOSE TEST) test strip CHECK BLOOD SUGAR FOUR TIMES DAILY 400 each 3   insulin  regular (HUMULIN R ) 100 units/mL injection INJECT 10 UNITS INTO THE SKIN THREE TIMES DAILY BEFORE MEALS 30 mL 1   Insulin  Syringe-Needle  U-100 (EASY TOUCH INSULIN  SYRINGE) 31G X 5/16" 0.3 ML MISC USE WITH INSULIN  EVERY DAY Dx E11.40 100 each 3   Lancets (UNILET MICRO-THIN 33G) MISC CHECK BLOOD SUGAR FOUR TIMES DAILY Dx E11.40 400 each 3   metoprolol  tartrate (LOPRESSOR ) 25 MG tablet TAKE 1 TABLET BY MOUTH TWICE DAILY 180 tablet 1   pantoprazole  (PROTONIX ) 40 MG tablet TAKE 1 TABLET BY MOUTH DAILY 90 tablet 1   Semaglutide ,0.25 or 0.5MG /DOS, (OZEMPIC , 0.25 OR 0.5 MG/DOSE,) 2 MG/3ML SOPN Inject 0.5 mg into the skin once a week. 3 mL 0   tamsulosin  (FLOMAX ) 0.4 MG CAPS capsule TAKE ONE CAPSULE BY MOUTH DAILY 90 capsule 5   No facility-administered medications prior to visit.    Allergies  Allergen Reactions   Bertha Broad ] Hives   Penicillins Hives    Hives + vomiting as an adult  - tolerated Ceftriaxone  04/2021   Penicillin G    Tetanus Toxoid     unknown    ROS Review of Systems  Constitutional:  Negative for fatigue and fever.  Eyes:  Negative for visual disturbance.  Respiratory:  Negative for chest tightness and shortness of breath.   Cardiovascular:  Negative for chest pain and palpitations.  Neurological:  Negative for dizziness and headaches.      Objective:    Physical Exam HENT:     Head: Normocephalic.     Right Ear: External ear normal.     Left Ear: External ear normal.     Nose: No congestion or rhinorrhea.     Mouth/Throat:     Mouth: Mucous membranes are moist.  Cardiovascular:     Rate and Rhythm: Regular rhythm.     Heart sounds: No murmur heard. Pulmonary:     Effort: No respiratory distress.     Breath sounds: Normal breath sounds.  Neurological:     Mental Status: He is alert.     BP 134/73   Pulse 75   Ht 6' (1.829 m)   Wt 228 lb 1.3 oz (103.5 kg)   SpO2 98%   BMI 30.93 kg/m  Wt Readings from Last 3 Encounters:  01/13/24 228 lb 1.3 oz (103.5 kg)  07/15/23 226 lb (102.5 kg)  06/09/23 227 lb (103 kg)    Lab Results  Component Value Date   TSH 2.731 06/14/2023   Lab  Results  Component Value Date   WBC 6.6 06/14/2023   HGB 15.2 06/14/2023   HCT 43.7 06/14/2023   MCV 90.9 06/14/2023   PLT 215 06/14/2023   Lab Results  Component Value Date   NA 134 (L) 06/14/2023   K 4.2 06/14/2023   CO2 28 06/14/2023   GLUCOSE 163 (H) 06/14/2023   BUN 47 (H) 06/14/2023   CREATININE 2.47 (H) 06/14/2023   BILITOT 0.5 06/14/2023   ALKPHOS 45 06/14/2023   AST 22 06/14/2023   ALT 21 06/14/2023   PROT 7.2 06/14/2023   ALBUMIN  4.1 06/14/2023   CALCIUM  9.4 06/14/2023   ANIONGAP 12 06/14/2023   EGFR 34 (L) 03/16/2023   Lab Results  Component Value Date   CHOL 169 06/14/2023   Lab Results  Component Value Date   HDL 38 (L) 06/14/2023   Lab Results  Component Value Date   LDLCALC 84 06/14/2023   Lab Results  Component Value Date   TRIG 233 (H) 06/14/2023   Lab Results  Component Value Date   CHOLHDL 4.4 06/14/2023   Lab Results  Component Value Date   HGBA1C 7.7 (H) 06/14/2023      Assessment & Plan:  Essential hypertension Assessment & Plan: Controlled Denies headaches, dizziness, blurred vision, chest pain, palpitation, shortness of breath Encouraged to continue taking metoprolol  25 mg twice daily, and furosemide  40 mg twice daily Encouraged low-sodium diet with increased physical activity BP Readings from Last 3 Encounters:  01/13/24 134/73  07/15/23 128/64  06/09/23 117/78      Type 2 diabetes mellitus with diabetic neuropathy, with long-term current use of insulin  (HCC) Assessment & Plan: Encouraged to continue to follow endocrinology and to continue his current treatment regiment Recommend decreasing his intake of high sugar foods and beverages and increasing his physical activity Lab Results  Component Value Date   HGBA1C 7.7 (H) 06/14/2023      Mixed hyperlipidemia Assessment & Plan: Encouraged to continue taking atorvastatin  80 mg daily, ezetimibe  10 mg daily, and fenofibrate  145 mg daily Encouraged decreasing his intake  of greasy, fatty, starchy foods with increase with activity Will assess lipid panel today Lab Results  Component Value Date   CHOL 169 06/14/2023   HDL 38 (L) 06/14/2023   LDLCALC 84 06/14/2023   TRIG 233 (H) 06/14/2023   CHOLHDL 4.4 06/14/2023      Note: This chart has been completed using [employer] software, and while attempts have been made to ensure accuracy, certain words and phrases may not be transcribed as intended.    Follow-up: Return in about 4 months (around 05/15/2024).   Eria Lozoya, FNP

## 2024-01-13 NOTE — Assessment & Plan Note (Signed)
 Encouraged to continue taking atorvastatin  80 mg daily, ezetimibe  10 mg daily, and fenofibrate  145 mg daily Encouraged decreasing his intake of greasy, fatty, starchy foods with increase with activity Will assess lipid panel today Lab Results  Component Value Date   CHOL 169 06/14/2023   HDL 38 (L) 06/14/2023   LDLCALC 84 06/14/2023   TRIG 233 (H) 06/14/2023   CHOLHDL 4.4 06/14/2023

## 2024-01-13 NOTE — Assessment & Plan Note (Signed)
 Encouraged to continue to follow endocrinology and to continue his current treatment regiment Recommend decreasing his intake of high sugar foods and beverages and increasing his physical activity Lab Results  Component Value Date   HGBA1C 7.7 (H) 06/14/2023

## 2024-01-13 NOTE — Assessment & Plan Note (Signed)
 Controlled Denies headaches, dizziness, blurred vision, chest pain, palpitation, shortness of breath Encouraged to continue taking metoprolol  25 mg twice daily, and furosemide  40 mg twice daily Encouraged low-sodium diet with increased physical activity BP Readings from Last 3 Encounters:  01/13/24 134/73  07/15/23 128/64  06/09/23 117/78

## 2024-01-21 DIAGNOSIS — E785 Hyperlipidemia, unspecified: Secondary | ICD-10-CM | POA: Diagnosis not present

## 2024-01-21 DIAGNOSIS — L97529 Non-pressure chronic ulcer of other part of left foot with unspecified severity: Secondary | ICD-10-CM | POA: Diagnosis not present

## 2024-01-21 DIAGNOSIS — E11621 Type 2 diabetes mellitus with foot ulcer: Secondary | ICD-10-CM | POA: Diagnosis not present

## 2024-01-21 DIAGNOSIS — L03116 Cellulitis of left lower limb: Secondary | ICD-10-CM | POA: Diagnosis not present

## 2024-01-21 DIAGNOSIS — K219 Gastro-esophageal reflux disease without esophagitis: Secondary | ICD-10-CM | POA: Diagnosis not present

## 2024-01-21 DIAGNOSIS — Z89421 Acquired absence of other right toe(s): Secondary | ICD-10-CM | POA: Diagnosis not present

## 2024-01-21 DIAGNOSIS — L03818 Cellulitis of other sites: Secondary | ICD-10-CM | POA: Diagnosis not present

## 2024-01-21 DIAGNOSIS — I10 Essential (primary) hypertension: Secondary | ICD-10-CM | POA: Diagnosis not present

## 2024-01-21 DIAGNOSIS — M79675 Pain in left toe(s): Secondary | ICD-10-CM | POA: Diagnosis not present

## 2024-01-21 DIAGNOSIS — M7989 Other specified soft tissue disorders: Secondary | ICD-10-CM | POA: Diagnosis not present

## 2024-01-21 DIAGNOSIS — J45909 Unspecified asthma, uncomplicated: Secondary | ICD-10-CM | POA: Diagnosis not present

## 2024-01-21 DIAGNOSIS — Z89422 Acquired absence of other left toe(s): Secondary | ICD-10-CM | POA: Diagnosis not present

## 2024-01-23 ENCOUNTER — Ambulatory Visit (INDEPENDENT_AMBULATORY_CARE_PROVIDER_SITE_OTHER)

## 2024-01-23 VITALS — BP 120/68 | HR 113 | Ht 72.0 in | Wt 225.0 lb

## 2024-01-23 DIAGNOSIS — L03116 Cellulitis of left lower limb: Secondary | ICD-10-CM | POA: Diagnosis not present

## 2024-01-23 NOTE — Progress Notes (Signed)
   Acute Office Visit  Subjective:     Patient ID: Todd Zhang, male    DOB: Dec 14, 1945, 78 y.o.   MRN: [id number]  Chief Complaint  Patient presents with   Hospitalization Follow-up    Hospital follow up, the y diagnosied him with "diabetic foot and cellulitis" pt wants to make sure he can take the medications     HPI Patient is in today for ER F/U at [facility]:  Patient 78 year old gentleman history of diabetes chronic pain hypertension presents with left toe pain has had previous diabetic foot infections and cellulitis by exam awake alert he has slight erythema to the body of the foot there he is not tachycardic cardiopulmonary general neuroexam negative the left fourth toe appears to have some cellulitis would give him trial of outpatient treatment with clindamycin  Rocephin  x-rays limited value of laboratory diagnostics I told him that if he fails to improve in the next 24 hours we would likely require additional diagnostic testing and possible admission   ROS      Objective:    BP 120/68   Pulse (!) 113   Ht 6' (1.829 m)   Wt 225 lb (102.1 kg)   SpO2 90%   BMI 30.52 kg/m    Physical Exam Vitals and nursing note reviewed.  Constitutional:      Appearance: Normal appearance.  Eyes:     Extraocular Movements: Extraocular movements intact.     Pupils: Pupils are equal, round, and reactive to light.  Cardiovascular:     Rate and Rhythm: Normal rate and regular rhythm.  Pulmonary:     Effort: Pulmonary effort is normal.     Breath sounds: Normal breath sounds.  Musculoskeletal:     Right lower leg: No edema.     Left lower leg: Tenderness present. 2+ Edema present.  Skin:    General: Skin is warm.  Neurological:     Mental Status: He is alert and oriented to person, place, and time.  Psychiatric:        Mood and Affect: Mood normal.        Thought Content: Thought content normal.     No results found for any visits on 01/23/24.      Assessment &  Plan:   Problem List Items Addressed This Visit   None Visit Diagnoses       Cellulitis of left lower extremity    -  Primary   advised pt to take oral antibiotics as prescribed by the ER.  advised for him to hold the ibuprofen given his cardiac history.  recommend f/u if no improvement.       No orders of the defined types were placed in this encounter.   No follow-ups on file.  Alison Irvine, FNP

## 2024-01-30 ENCOUNTER — Encounter: Payer: Self-pay | Admitting: Internal Medicine

## 2024-01-30 ENCOUNTER — Ambulatory Visit: Attending: Internal Medicine | Admitting: Internal Medicine

## 2024-01-30 VITALS — BP 128/78 | HR 76 | Ht 72.0 in | Wt 229.0 lb

## 2024-01-30 DIAGNOSIS — I4819 Other persistent atrial fibrillation: Secondary | ICD-10-CM

## 2024-01-30 DIAGNOSIS — R079 Chest pain, unspecified: Secondary | ICD-10-CM

## 2024-01-30 NOTE — Progress Notes (Signed)
 Cardiology Office Note  Date: 01/30/2024   ID: [name], [name] May 20, 1946, MRN [id number]  PCP:  Zarwolo, Gloria, FNP  Cardiologist:  Lasalle Pointer, MD Electrophysiologist:  None   Reason for Office Visit: Follow-up for A-fib   History of Present Illness: Todd Zhang is a 78 y.o. male known to have persistent A-fib s/p DCCV in 2016, PAD manifested by CLI with ulceration s/p L CFA endarterectomy, L CFA-TP trunk bypass, L CIA stent, L below-knee popliteal with TP endarterectomy and L great toe amputation in 2022, HTN, HLD, DM 2 presented to cardiology clinic for follow-up visit of A-fib.  Patient was initially scheduled for TEE guided DCCV in 07/2022 but had to be canceled as patient stopped taking all his medications.  He was not scheduled for any more DCCV later on. Due to recurrent falls, systemic AC was deferred and due to frequent forgetfulness, LA implantation was also deferred.  He is here today for follow-up visit. He recently had ER visit/hospitalization for left toe infection for which currently he is on antibiotics.  He continues to complains of exertional chest pains lasting for few minutes and associated with DOE.  He has a history of CKD no dizziness, palpitations or syncope. He continues to have falls 2-3 times per week.  He is on Plavix  75 mg once daily per vascular surgery recommendations.  Past Medical History:  Diagnosis Date   Atrial fibrillation (HCC)    Cervical spinal stenosis    DDD (degenerative disc disease), cervical    DDD (degenerative disc disease), lumbar    Diabetes mellitus without complication (HCC)    Dyspnea    Hyperlipidemia    Hypertension    Peripheral artery disease (HCC)     Past Surgical History:  Procedure Laterality Date   ABDOMINAL AORTOGRAM W/LOWER EXTREMITY N/A 04/30/2021   Procedure: ABDOMINAL AORTOGRAM W/LOWER EXTREMITY;  Surgeon: Carlene Che, MD;  [facility]: [facility];  Service: Cardiovascular;  Laterality:  N/A;   ABDOMINAL AORTOGRAM W/LOWER EXTREMITY N/A 10/25/2022   Procedure: ABDOMINAL AORTOGRAM W/LOWER EXTREMITY;  Surgeon: Young Hensen, MD;  Location: [facility];  Service: Cardiovascular;  Laterality: N/A;   AMPUTATION TOE     AMPUTATION TOE Left 05/04/2021   Procedure: LEFT GREAT TOE AMPUTATION;  Surgeon: Young Hensen, MD;  Location: [facility] OR;  Service: Vascular;  Laterality: Left;   AMPUTATION TOE Left 10/26/2022   Procedure: AMPUTATION LEFT SECOND TOE;  Surgeon: Jennefer Moats, DPM;  Location: [facility] OR;  Service: Podiatry;  Laterality: Left;   BYPASS GRAFT FEMORAL-PERONEAL Left 05/04/2021   Procedure: LEFT COMMON FEMORAL-TIBIOPERONEAL TRUNK BYPASS;  Surgeon: Young Hensen, MD;  Location: [facility] OR;  Service: Vascular;  Laterality: Left;   ENDARTERECTOMY FEMORAL Left 05/04/2021   Procedure: LEFT COMMON FEMORAL ENDARTERECTOMY WITH BOVINE PATCH;  Surgeon: Young Hensen, MD;  Location: [facility] OR;  Service: Vascular;  Laterality: Left;   ENDARTERECTOMY TIBIOPERONEAL Left 05/04/2021   Procedure: LEFT BELOW KNEE POPITEAL WITH  TIBIOPERONEAL ENDARTERECTOMY;  Surgeon: Young Hensen, MD;  Location: [facility] OR;  Service: Vascular;  Laterality: Left;   ENDOVEIN HARVEST OF GREATER SAPHENOUS VEIN  05/04/2021   Procedure: HARVEST OF LEFT GREATER SAPHENOUS VEIN;  Surgeon: Young Hensen, MD;  Location: [facility] OR;  Service: Vascular;;   INSERTION OF ILIAC STENT Left 05/04/2021   Procedure: LEFT COMMON ILLIAC ATERY ANGIOPLASTY WITH STENT;  Surgeon: Young Hensen, MD;  Location: [facility] OR;  Service: Vascular;  Laterality: Left;  INTRAOPERATIVE ARTERIOGRAM Left 05/04/2021   Procedure: LEFT ARTERY ARTERIOGRAM;  Surgeon: Young Hensen, MD;  Location: [facility] OR;  Service: Vascular;  Laterality: Left;   IRRIGATION AND DEBRIDEMENT FOOT Left 05/04/2021   Procedure: IRRIGATION AND DEBRIDEMENT FOOT;  Surgeon: Young Hensen, MD;  Location: [facility] OR;  Service: Vascular;  Laterality: Left;    KNEE SURGERY Left    NOSE SURGERY     PERIPHERAL VASCULAR INTERVENTION  10/25/2022   Procedure: PERIPHERAL VASCULAR INTERVENTION;  Surgeon: Young Hensen, MD;  Location: [facility];  Service: Cardiovascular;;   TEE WITHOUT CARDIOVERSION N/A 05/01/2021   Procedure: TRANSESOPHAGEAL ECHOCARDIOGRAM (TEE);  Surgeon: Maudine Sos, MD;  Location: [facility] ENDOSCOPY;  Service: Cardiovascular;  Laterality: N/A;    Current Outpatient Medications  Medication Sig Dispense Refill   atorvastatin  (LIPITOR) 80 MG tablet TAKE 1 TABLET BY MOUTH DAILY 90 tablet 3   Blood Glucose Monitoring Suppl (GNP TRUE METRIX GLUCOSE METER) w/Device KIT      cefdinir (OMNICEF) 300 MG capsule Take 600 mg by mouth daily.     clindamycin  (CLEOCIN ) 150 MG capsule Take 150 mg by mouth every 6 (six) hours.     clopidogrel  (PLAVIX ) 75 MG tablet TAKE 1 TABLET BY MOUTH DAILY 90 tablet 1   ezetimibe  (ZETIA ) 10 MG tablet TAKE 1 TABLET BY MOUTH EVERY DAY 90 tablet 3   FARXIGA  10 MG TABS tablet TAKE 1 TABLET BY MOUTH DAILY BEFORE BREAKFAST 30 tablet 3   fenofibrate  (TRICOR ) 145 MG tablet Take 1 tablet (145 mg total) by mouth daily. 90 tablet 1   furosemide  (LASIX ) 40 MG tablet TAKE 1 TABLET BY MOUTH TWICE DAILY 60 tablet 1   gabapentin  (NEURONTIN ) 300 MG capsule TAKE ONE CAPSULE BY MOUTH FOUR TIMES DAILY 120 capsule 5   glimepiride  (AMARYL ) 2 MG tablet TAKE 1 TABLET BY MOUTH DAILY BEFORE BREAKFAST 30 tablet 3   glucose blood (TRUE METRIX BLOOD GLUCOSE TEST) test strip CHECK BLOOD SUGAR FOUR TIMES DAILY 400 each 3   insulin  regular (HUMULIN R ) 100 units/mL injection INJECT 10 UNITS INTO THE SKIN THREE TIMES DAILY BEFORE MEALS 30 mL 1   Insulin  Syringe-Needle U-100 (EASY TOUCH INSULIN  SYRINGE) 31G X 5/16" 0.3 ML MISC USE WITH INSULIN  EVERY DAY Dx E11.40 100 each 3   Lancets (UNILET MICRO-THIN 33G) MISC CHECK BLOOD SUGAR FOUR TIMES DAILY Dx E11.40 400 each 3   metoprolol  tartrate (LOPRESSOR ) 25 MG tablet TAKE 1 TABLET BY MOUTH  TWICE DAILY 180 tablet 1   pantoprazole  (PROTONIX ) 40 MG tablet TAKE 1 TABLET BY MOUTH DAILY 90 tablet 1   tamsulosin  (FLOMAX ) 0.4 MG CAPS capsule TAKE ONE CAPSULE BY MOUTH DAILY 90 capsule 5   Evolocumab  (REPATHA  SURECLICK) 140 MG/ML SOAJ Inject 140 mg into the skin every 14 (fourteen) days. (Patient not taking: Reported on 01/30/2024) 2 mL 2   Semaglutide ,0.25 or 0.5MG /DOS, (OZEMPIC , 0.25 OR 0.5 MG/DOSE,) 2 MG/3ML SOPN Inject 0.5 mg into the skin once a week. (Patient not taking: Reported on 01/30/2024) 3 mL 0   No current facility-administered medications for this visit.   Allergies:  Asa [aspirin ], Penicillins, Penicillin g, and Tetanus toxoid   Social History: The patient  reports that he quit smoking about 12 years ago. His smoking use included cigarettes. He started smoking about 57 years ago. He has a 45 pack-year smoking history. He has been exposed to tobacco smoke. He has never used smokeless tobacco. He reports that he does not currently use drugs. He  reports that he does not drink alcohol.   Family History: The patient's family history includes Cancer in his father; Heart disease in his mother; Stroke in his mother.   ROS:  Please see the history of present illness. Otherwise, complete review of systems is positive for none.  All other systems are reviewed and negative.   Physical Exam: VS:  BP 128/78   Pulse 76   Ht 6' (1.829 m)   Wt 229 lb (103.9 kg)   SpO2 97%   BMI 31.06 kg/m , BMI Body mass index is 31.06 kg/m.  Wt Readings from Last 3 Encounters:  01/30/24 229 lb (103.9 kg)  01/23/24 225 lb (102.1 kg)  01/13/24 228 lb 1.3 oz (103.5 kg)    General: Patient appears comfortable at rest. HEENT: Conjunctiva and lids normal, oropharynx clear with moist mucosa. Neck: Supple, no elevated JVP or carotid bruits, no thyromegaly. Lungs: Clear to auscultation, nonlabored breathing at rest. Cardiac: Regular rate and rhythm, no S3 or significant systolic murmur, no pericardial  rub. Abdomen: Soft, nontender, no hepatomegaly, bowel sounds present, no guarding or rebound. Extremities: 1+ pitting edema in left lower extremity, distal pulses 2+. Skin: Left second toe erythematous and with distal ulcer Musculoskeletal: No kyphosis. Neuropsychiatric: Alert and oriented x3, affect grossly appropriate.  ECG:  An ECG dated 10/04/2022 was personally reviewed today and demonstrated:  Atrial fibrillation, rate controlled  Recent Labwork: 05/25/2023: B Natriuretic Peptide 153.0; Magnesium  1.6 06/14/2023: ALT 21; AST 22; BUN 47; Creatinine, Ser 2.47; Hemoglobin 15.2; Platelets 215; Potassium 4.2; Sodium 134; TSH 2.731     Component Value Date/Time   CHOL 169 06/14/2023 0907   CHOL 201 (H) 03/16/2023 0803   TRIG 233 (H) 06/14/2023 0907   HDL 38 (L) 06/14/2023 0907   HDL 42 03/16/2023 0803   CHOLHDL 4.4 06/14/2023 0907   VLDL 47 (H) 06/14/2023 0907   LDLCALC 84 06/14/2023 0907   LDLCALC 94 03/16/2023 0803    Assessment and Plan:  # Exertional chest pains, rule out significant CAD - Obtain cardiac PET stress due to history of CKD.  Patient cannot read or write.  He suggested to call his sisters to arrange the date and time for the PET scan.  His sisters are 89 years old and 84 years old.  # Cellulitis of left second toe - Currently on antibiotics  # Persistent A-fib s/p DCCV in 2016 -Asymptomatic, EKG today showed rate controlled atrial fibrillation, HR 77 bpm and frequent PVCs.  Previously scheduled for DCCV in 2023 but had to be canceled as he forgot to take all his medications.  Recently has been more compliant.  Continue metoprolol  tartrate 25 mg twice daily.  Not on systemic AC due to frequent falls.  Falls occur 2-3 times per week.  Not a candidate for LAA implantation due to frequent forgetfulness.  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  Follows with vascular surgery.  # HLD - Continue atorvastatin  80 mg nightly, Zetia  10 mg once daily and fenofibrate  145 mg once daily.  Lipid panel from October 2024 reviewed, LDL 84 and TG 233, both elevated.  Not taking Repatha .  Will continue current medications and repeat lipid panel.  Can be done by PCP.  # Mild aortic valve stenosis -Obtain echocardiogram in 3  years, next echo will be in 2026.  I have spent a total of 30 minutes with patient reviewing chart , EKGs, labs and examining patient as well as establishing an assessment and plan that was discussed with the patient.  > 50% of time was spent in direct patient care.   Medication Adjustments/Labs and Tests Ordered: Current medicines are reviewed at length with the patient today.  Concerns regarding medicines are outlined above.   Tests Ordered: Orders Placed This Encounter  Procedures   EKG 12-Lead    Medication Changes: No orders of the defined types were placed in this encounter.   Disposition:  Follow up 8 months  Signed Madisan Bice Priya Lamount Bankson, MD, 01/30/2024 11:05 AM    [facility] at [facility] [address] [place], [place], [place] [address]

## 2024-01-30 NOTE — Patient Instructions (Addendum)
 Medication Instructions:  Your physician recommends that you continue on your current medications as directed. Please refer to the Current Medication list given to you today.   Labwork:   Testing/Procedures:    Please report to Radiology at the [facility] Main Entrance 30 minutes early for your test.  [address] [place], [place] [address]  How to Prepare for Your Cardiac PET/CT Stress Test:  Nothing to eat or drink, except water, 3 hours prior to arrival time.  NO caffeine/decaffeinated products, or chocolate 12 hours prior to arrival. (Please note decaffeinated beverages (teas/coffees) still contain caffeine).  If you have caffeine within 12 hours prior, the test will need to be rescheduled.  Medication instructions: Do not take erectile dysfunction medications for 72 hours prior to test (sildenafil, tadalafil) Do not take nitrates (isosorbide mononitrate, Ranexa) the day before or day of test Do not take tamsulosin  the day before or morning of test Hold theophylline containing medications for 12 hours. Hold Dipyridamole 48 hours prior to the test.  Diabetic Preparation: If able to eat breakfast prior to 3 hour fasting, you may take all medications, including your insulin . Do not worry if you miss your breakfast dose of insulin  - start at your next meal. If you do not eat prior to 3 hour fast-Hold all diabetes (oral and insulin ) medications. Patients who wear a continuous glucose monitor MUST remove the device prior to scanning.  You may take your remaining medications with water.  NO perfume, cologne or lotion on chest or abdomen area. FEMALES - Please avoid wearing dresses to this appointment.  Total time is 1 to 2 hours; you may want to bring reading material for the waiting time.  IF YOU THINK YOU MAY BE PREGNANT, OR ARE NURSING PLEASE INFORM THE TECHNOLOGIST.  In preparation for your appointment, medication and supplies will be purchased.  Appointment  availability is limited, so if you need to cancel or reschedule, please call the Radiology Department Scheduler at [phone] 24 hours in advance to avoid a cancellation fee of $100.00  What to Expect When you Arrive:  Once you arrive and check in for your appointment, you will be taken to a preparation room within the Radiology Department.  A technologist or Nurse will obtain your medical history, verify that you are correctly prepped for the exam, and explain the procedure.  Afterwards, an IV will be started in your arm and electrodes will be placed on your skin for EKG monitoring during the stress portion of the exam. Then you will be escorted to the PET/CT scanner.  There, staff will get you positioned on the scanner and obtain a blood pressure and EKG.  During the exam, you will continue to be connected to the EKG and blood pressure machines.  A small, safe amount of a radioactive tracer will be injected in your IV to obtain a series of pictures of your heart along with an injection of a stress agent.    After your Exam:  It is recommended that you eat a meal and drink a caffeinated beverage to counter act any effects of the stress agent.  Drink plenty of fluids for the remainder of the day and urinate frequently for the first couple of hours after the exam.  Your doctor will inform you of your test results within 7-10 business days.  For more information and frequently asked questions, please visit our website: [email or web address]  For questions about your test or how to prepare for your test,  please call: Cardiac Imaging Nurse Navigators Office: [phone]   Follow-Up: Your physician recommends that you schedule a follow-up appointment in: 8 months  Any Other Special Instructions Will Be Listed Below (If Applicable). Thank you for choosing [facility]!     If you need a refill on your cardiac medications before your next appointment, please call your  pharmacy.

## 2024-01-31 DIAGNOSIS — R079 Chest pain, unspecified: Secondary | ICD-10-CM | POA: Insufficient documentation

## 2024-02-06 ENCOUNTER — Other Ambulatory Visit: Payer: Self-pay | Admitting: Family Medicine

## 2024-02-06 DIAGNOSIS — Z794 Long term (current) use of insulin: Secondary | ICD-10-CM

## 2024-02-06 DIAGNOSIS — G8929 Other chronic pain: Secondary | ICD-10-CM

## 2024-02-06 DIAGNOSIS — M5030 Other cervical disc degeneration, unspecified cervical region: Secondary | ICD-10-CM

## 2024-02-16 ENCOUNTER — Other Ambulatory Visit: Payer: Self-pay | Admitting: Family Medicine

## 2024-02-16 DIAGNOSIS — N401 Enlarged prostate with lower urinary tract symptoms: Secondary | ICD-10-CM

## 2024-02-16 DIAGNOSIS — E1140 Type 2 diabetes mellitus with diabetic neuropathy, unspecified: Secondary | ICD-10-CM

## 2024-02-22 ENCOUNTER — Ambulatory Visit: Payer: Medicare HMO

## 2024-03-15 ENCOUNTER — Ambulatory Visit: Payer: Self-pay | Admitting: Family Medicine

## 2024-03-17 ENCOUNTER — Other Ambulatory Visit: Payer: Self-pay | Admitting: Family Medicine

## 2024-03-17 ENCOUNTER — Other Ambulatory Visit: Payer: Self-pay | Admitting: Internal Medicine

## 2024-03-17 DIAGNOSIS — I10 Essential (primary) hypertension: Secondary | ICD-10-CM

## 2024-03-20 ENCOUNTER — Other Ambulatory Visit: Payer: Self-pay | Admitting: Internal Medicine

## 2024-03-20 ENCOUNTER — Other Ambulatory Visit: Payer: Self-pay | Admitting: Family Medicine

## 2024-03-21 ENCOUNTER — Other Ambulatory Visit: Payer: Self-pay | Admitting: Family Medicine

## 2024-04-17 ENCOUNTER — Encounter (HOSPITAL_COMMUNITY)

## 2024-04-17 ENCOUNTER — Ambulatory Visit

## 2024-04-17 ENCOUNTER — Other Ambulatory Visit (HOSPITAL_COMMUNITY)

## 2024-05-02 ENCOUNTER — Inpatient Hospital Stay (HOSPITAL_COMMUNITY): Admission: RE | Admit: 2024-05-02 | Source: Ambulatory Visit

## 2024-05-09 ENCOUNTER — Other Ambulatory Visit: Payer: Self-pay | Admitting: Family Medicine

## 2024-05-09 ENCOUNTER — Other Ambulatory Visit: Payer: Self-pay | Admitting: Nurse Practitioner

## 2024-05-09 DIAGNOSIS — E785 Hyperlipidemia, unspecified: Secondary | ICD-10-CM

## 2024-05-09 DIAGNOSIS — K219 Gastro-esophageal reflux disease without esophagitis: Secondary | ICD-10-CM

## 2024-05-09 DIAGNOSIS — I10 Essential (primary) hypertension: Secondary | ICD-10-CM

## 2024-05-15 ENCOUNTER — Other Ambulatory Visit (HOSPITAL_COMMUNITY)

## 2024-05-21 ENCOUNTER — Ambulatory Visit: Admitting: Family Medicine

## 2024-05-30 ENCOUNTER — Encounter: Payer: Self-pay | Admitting: Family Medicine

## 2024-06-08 ENCOUNTER — Other Ambulatory Visit: Payer: Self-pay | Admitting: Family Medicine

## 2024-06-08 DIAGNOSIS — E1140 Type 2 diabetes mellitus with diabetic neuropathy, unspecified: Secondary | ICD-10-CM

## 2024-06-15 ENCOUNTER — Other Ambulatory Visit: Payer: Self-pay | Admitting: Nurse Practitioner

## 2024-06-15 DIAGNOSIS — E785 Hyperlipidemia, unspecified: Secondary | ICD-10-CM

## 2024-06-18 ENCOUNTER — Telehealth (HOSPITAL_COMMUNITY): Payer: Self-pay | Admitting: Emergency Medicine

## 2024-06-18 ENCOUNTER — Telehealth: Payer: Self-pay

## 2024-06-18 NOTE — Telephone Encounter (Signed)
 Left vm for clarification, no referral placed by [facility] since 2024

## 2024-06-18 NOTE — Telephone Encounter (Signed)
Attempted to call patient regarding upcoming cardiac PET appointment. Left message on voicemail with name and callback number Aidan Moten RN Navigator Cardiac Imaging [facility] [phone] Office [phone] Cell  

## 2024-06-18 NOTE — Telephone Encounter (Signed)
 Copied from CRM #[id number]. Topic: General - Other >> Jun 18, 2024 12:18 PM Deaijah H wrote: Reason for CRM: Patient wife called in wanting referral sent to different location that's close by instead of [place]. Please call [phone] ; cell phone just incase [phone]

## 2024-06-19 ENCOUNTER — Ambulatory Visit (HOSPITAL_COMMUNITY): Admission: RE | Admit: 2024-06-19 | Source: Ambulatory Visit

## 2024-06-28 DIAGNOSIS — H35031 Hypertensive retinopathy, right eye: Secondary | ICD-10-CM | POA: Diagnosis not present

## 2024-07-03 ENCOUNTER — Ambulatory Visit

## 2024-07-05 ENCOUNTER — Other Ambulatory Visit: Payer: Self-pay | Admitting: Family Medicine

## 2024-07-05 ENCOUNTER — Other Ambulatory Visit: Payer: Self-pay | Admitting: Internal Medicine

## 2024-07-05 DIAGNOSIS — I739 Peripheral vascular disease, unspecified: Secondary | ICD-10-CM

## 2024-07-05 DIAGNOSIS — I10 Essential (primary) hypertension: Secondary | ICD-10-CM

## 2024-07-11 NOTE — Progress Notes (Signed)
 Todd Zhang                                          MRN: [id number]   07/11/2024   The [facility]BCI Quality Team Specialist reviewed this patient medical record for the purposes of chart review for care gap closure. The following were reviewed: chart review for care gap closure-kidney health evaluation for diabetes:eGFR  and uACR.    VBCI Quality Team

## 2024-07-12 ENCOUNTER — Other Ambulatory Visit: Payer: Self-pay | Admitting: Nurse Practitioner

## 2024-07-12 DIAGNOSIS — E785 Hyperlipidemia, unspecified: Secondary | ICD-10-CM

## 2024-07-18 ENCOUNTER — Telehealth: Payer: Self-pay | Admitting: Internal Medicine

## 2024-07-18 DIAGNOSIS — E785 Hyperlipidemia, unspecified: Secondary | ICD-10-CM

## 2024-07-18 MED ORDER — FENOFIBRATE 145 MG PO TABS: 145.0000 mg | ORAL_TABLET | Freq: Every day | ORAL | 1 refills | Status: AC

## 2024-07-18 NOTE — Telephone Encounter (Signed)
 *  STAT* If patient is at the pharmacy, call can be transferred to refill team.   1. Which medications need to be refilled? (please list name of each medication and dose if known)   fenofibrate  (TRICOR ) 145 MG tablet     2. Would you like to learn more about the convenience, safety, & potential cost savings by using the [facility]? No      3. Are you open to using the [facility] (Type [facility]. ). No    4. Which pharmacy/location (including street and city if local pharmacy) is medication to be sent to? [facility] - [place], [place] - [address]    5. Do they need a 30 day or 90 day supply? 90 days   Patient is out of meds need refill today

## 2024-07-18 NOTE — Telephone Encounter (Signed)
 Pt's medication was sent to pt's pharmacy as requested. Confirmation received.

## 2024-08-01 ENCOUNTER — Other Ambulatory Visit: Payer: Self-pay | Admitting: Family Medicine

## 2024-08-01 DIAGNOSIS — M5030 Other cervical disc degeneration, unspecified cervical region: Secondary | ICD-10-CM

## 2024-08-01 DIAGNOSIS — G8929 Other chronic pain: Secondary | ICD-10-CM

## 2024-08-23 ENCOUNTER — Encounter (HOSPITAL_COMMUNITY): Payer: Self-pay

## 2024-08-23 NOTE — ED Notes (Signed)
Pt ambulated to the restroom with no difficulties.

## 2024-08-23 NOTE — ED Triage Notes (Signed)
 States that pinky toe on left foot has a wound and is draining x a couple weeks. Toe amputation x 3 on left foot.

## 2024-08-23 NOTE — Plan of Care (Signed)
 Patient with history of vascular insufficiency presented to [facility] with a left toe injury. Patient has had worsening purluent drainage. 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

## 2024-08-23 NOTE — ED Notes (Signed)
 Pt to be NPO after midnight per Andrew Kopel, PA.  Pt given turkey sandwich, apple sauce, ice water and coke.  No further needs expressed at this time.

## 2024-08-24 ENCOUNTER — Inpatient Hospital Stay (HOSPITAL_COMMUNITY)
Admission: EM | Admit: 2024-08-24 | Discharge: 2024-08-26 | DRG: 617 | Disposition: A | Source: Other Acute Inpatient Hospital | Attending: Internal Medicine | Admitting: Internal Medicine

## 2024-08-24 DIAGNOSIS — K219 Gastro-esophageal reflux disease without esophagitis: Secondary | ICD-10-CM | POA: Diagnosis present

## 2024-08-24 DIAGNOSIS — I130 Hypertensive heart and chronic kidney disease with heart failure and stage 1 through stage 4 chronic kidney disease, or unspecified chronic kidney disease: Secondary | ICD-10-CM | POA: Diagnosis present

## 2024-08-24 DIAGNOSIS — Z743 Need for continuous supervision: Secondary | ICD-10-CM | POA: Diagnosis not present

## 2024-08-24 DIAGNOSIS — M86172 Other acute osteomyelitis, left ankle and foot: Secondary | ICD-10-CM | POA: Diagnosis present

## 2024-08-24 DIAGNOSIS — Z89412 Acquired absence of left great toe: Secondary | ICD-10-CM | POA: Diagnosis not present

## 2024-08-24 DIAGNOSIS — Z79899 Other long term (current) drug therapy: Secondary | ICD-10-CM | POA: Diagnosis not present

## 2024-08-24 DIAGNOSIS — Z8249 Family history of ischemic heart disease and other diseases of the circulatory system: Secondary | ICD-10-CM | POA: Diagnosis not present

## 2024-08-24 DIAGNOSIS — Z7984 Long term (current) use of oral hypoglycemic drugs: Secondary | ICD-10-CM | POA: Diagnosis not present

## 2024-08-24 DIAGNOSIS — M4802 Spinal stenosis, cervical region: Secondary | ICD-10-CM | POA: Diagnosis present

## 2024-08-24 DIAGNOSIS — E785 Hyperlipidemia, unspecified: Secondary | ICD-10-CM | POA: Diagnosis present

## 2024-08-24 DIAGNOSIS — E1122 Type 2 diabetes mellitus with diabetic chronic kidney disease: Secondary | ICD-10-CM | POA: Diagnosis present

## 2024-08-24 DIAGNOSIS — N1831 Chronic kidney disease, stage 3a: Secondary | ICD-10-CM | POA: Diagnosis present

## 2024-08-24 DIAGNOSIS — Z794 Long term (current) use of insulin: Secondary | ICD-10-CM | POA: Diagnosis not present

## 2024-08-24 DIAGNOSIS — I2510 Atherosclerotic heart disease of native coronary artery without angina pectoris: Secondary | ICD-10-CM | POA: Diagnosis present

## 2024-08-24 DIAGNOSIS — M5030 Other cervical disc degeneration, unspecified cervical region: Secondary | ICD-10-CM | POA: Diagnosis present

## 2024-08-24 DIAGNOSIS — I4891 Unspecified atrial fibrillation: Secondary | ICD-10-CM | POA: Diagnosis present

## 2024-08-24 DIAGNOSIS — N400 Enlarged prostate without lower urinary tract symptoms: Secondary | ICD-10-CM | POA: Diagnosis present

## 2024-08-24 DIAGNOSIS — M79672 Pain in left foot: Secondary | ICD-10-CM | POA: Diagnosis not present

## 2024-08-24 DIAGNOSIS — Z7985 Long-term (current) use of injectable non-insulin antidiabetic drugs: Secondary | ICD-10-CM | POA: Diagnosis not present

## 2024-08-24 DIAGNOSIS — Z87891 Personal history of nicotine dependence: Secondary | ICD-10-CM | POA: Diagnosis not present

## 2024-08-24 DIAGNOSIS — I10 Essential (primary) hypertension: Secondary | ICD-10-CM | POA: Diagnosis not present

## 2024-08-24 DIAGNOSIS — E1152 Type 2 diabetes mellitus with diabetic peripheral angiopathy with gangrene: Secondary | ICD-10-CM | POA: Diagnosis present

## 2024-08-24 DIAGNOSIS — E1169 Type 2 diabetes mellitus with other specified complication: Secondary | ICD-10-CM | POA: Diagnosis present

## 2024-08-24 DIAGNOSIS — Z89422 Acquired absence of other left toe(s): Secondary | ICD-10-CM | POA: Diagnosis not present

## 2024-08-24 DIAGNOSIS — G8929 Other chronic pain: Secondary | ICD-10-CM | POA: Diagnosis present

## 2024-08-24 DIAGNOSIS — M48061 Spinal stenosis, lumbar region without neurogenic claudication: Secondary | ICD-10-CM | POA: Diagnosis present

## 2024-08-24 DIAGNOSIS — M869 Osteomyelitis, unspecified: Secondary | ICD-10-CM | POA: Diagnosis present

## 2024-08-24 DIAGNOSIS — Z7902 Long term (current) use of antithrombotics/antiplatelets: Secondary | ICD-10-CM | POA: Diagnosis not present

## 2024-08-24 MED ORDER — ACETAMINOPHEN 650 MG RE SUPP: 650.0000 mg | Freq: Four times a day (QID) | RECTAL | Status: DC | PRN

## 2024-08-24 MED ORDER — INSULIN GLARGINE 100 UNIT/ML ~~LOC~~ SOLN
20.0000 [IU] | Freq: Every day | SUBCUTANEOUS | Status: DC
  Administered 2024-08-25 – 2024-08-26 (×2): 20 [IU] via SUBCUTANEOUS
  Filled 2024-08-24 (×2): qty 0.2

## 2024-08-24 MED ORDER — ONDANSETRON HCL 4 MG PO TABS: 4.0000 mg | ORAL_TABLET | Freq: Four times a day (QID) | ORAL | Status: DC | PRN

## 2024-08-24 MED ORDER — CLOPIDOGREL BISULFATE 75 MG PO TABS
75.0000 mg | ORAL_TABLET | Freq: Every day | ORAL | Status: DC
  Administered 2024-08-25: 75 mg via ORAL
  Filled 2024-08-24: qty 1

## 2024-08-24 MED ORDER — ENOXAPARIN SODIUM 40 MG/0.4ML IJ SOSY
40.0000 mg | PREFILLED_SYRINGE | INTRAMUSCULAR | Status: DC
  Administered 2024-08-24 – 2024-08-25 (×2): 40 mg via SUBCUTANEOUS
  Filled 2024-08-24 (×2): qty 0.4

## 2024-08-24 MED ORDER — INSULIN GLARGINE-YFGN 100 UNIT/ML ~~LOC~~ SOLN: 20.0000 [IU] | Freq: Every day | SUBCUTANEOUS | Status: DC

## 2024-08-24 MED ORDER — SODIUM CHLORIDE 0.9 % IV SOLN
2.0000 g | Freq: Two times a day (BID) | INTRAVENOUS | Status: DC
  Administered 2024-08-25 – 2024-08-26 (×3): 2 g via INTRAVENOUS
  Filled 2024-08-24 (×3): qty 12.5

## 2024-08-24 MED ORDER — INSULIN ASPART 100 UNIT/ML IJ SOLN: 0.0000 [IU] | Freq: Three times a day (TID) | INTRAMUSCULAR | Status: DC

## 2024-08-24 MED ORDER — ATORVASTATIN CALCIUM 80 MG PO TABS
80.0000 mg | ORAL_TABLET | Freq: Every day | ORAL | Status: DC
  Administered 2024-08-24 – 2024-08-26 (×3): 80 mg via ORAL
  Filled 2024-08-24 (×3): qty 1

## 2024-08-24 MED ORDER — FUROSEMIDE 40 MG PO TABS
40.0000 mg | ORAL_TABLET | Freq: Two times a day (BID) | ORAL | Status: DC
  Administered 2024-08-24 – 2024-08-25 (×3): 40 mg via ORAL
  Filled 2024-08-24 (×4): qty 1

## 2024-08-24 MED ORDER — METOPROLOL TARTRATE 25 MG PO TABS
25.0000 mg | ORAL_TABLET | Freq: Two times a day (BID) | ORAL | Status: DC
  Administered 2024-08-24 – 2024-08-26 (×4): 25 mg via ORAL
  Filled 2024-08-24 (×4): qty 1

## 2024-08-24 MED ORDER — TAMSULOSIN HCL 0.4 MG PO CAPS
0.4000 mg | ORAL_CAPSULE | Freq: Every day | ORAL | Status: DC
  Administered 2024-08-25 – 2024-08-26 (×2): 0.4 mg via ORAL
  Filled 2024-08-24 (×2): qty 1

## 2024-08-24 MED ORDER — ONDANSETRON HCL 4 MG/2ML IJ SOLN: 4.0000 mg | Freq: Four times a day (QID) | INTRAMUSCULAR | Status: DC | PRN

## 2024-08-24 MED ORDER — ACETAMINOPHEN 325 MG PO TABS: 650.0000 mg | ORAL_TABLET | Freq: Four times a day (QID) | ORAL | Status: DC | PRN

## 2024-08-24 MED ORDER — EZETIMIBE 10 MG PO TABS
10.0000 mg | ORAL_TABLET | Freq: Every day | ORAL | Status: DC
  Administered 2024-08-24 – 2024-08-26 (×3): 10 mg via ORAL
  Filled 2024-08-24 (×3): qty 1

## 2024-08-24 MED ORDER — PANTOPRAZOLE SODIUM 40 MG PO TBEC
40.0000 mg | DELAYED_RELEASE_TABLET | Freq: Every day | ORAL | Status: DC
  Administered 2024-08-25 – 2024-08-26 (×2): 40 mg via ORAL
  Filled 2024-08-24 (×2): qty 1

## 2024-08-24 MED ORDER — GABAPENTIN 300 MG PO CAPS
300.0000 mg | ORAL_CAPSULE | Freq: Four times a day (QID) | ORAL | Status: DC
  Administered 2024-08-24 – 2024-08-26 (×6): 300 mg via ORAL
  Filled 2024-08-24 (×6): qty 1

## 2024-08-24 MED ORDER — FENOFIBRATE 160 MG PO TABS
160.0000 mg | ORAL_TABLET | Freq: Every day | ORAL | Status: DC
  Administered 2024-08-24 – 2024-08-26 (×3): 160 mg via ORAL
  Filled 2024-08-24 (×3): qty 1

## 2024-08-24 MED ORDER — INSULIN ASPART 100 UNIT/ML IJ SOLN
0.0000 [IU] | Freq: Three times a day (TID) | INTRAMUSCULAR | Status: DC
  Administered 2024-08-25 – 2024-08-26 (×2): 3 [IU] via SUBCUTANEOUS
  Filled 2024-08-24 (×2): qty 3

## 2024-08-24 NOTE — H&P (Signed)
 History and Physical    Todd Zhang FMW:[id number] DOB: [birth year] DOA: 08/24/2024  PCP: Edman Meade PEDLAR, FNP   Chief Complaint:  toe wound  HPI: Todd Zhang is a 78 y.o. male with medical history significant of A-fib, hypertension, hyperlipidemia who presented to [facility] with a left toe wound.  Patient's been having progressively worsening purulent drainage prompting presentation to outside emergency department.  At outside hospital labs were obtained which showed WBC 10.2, hemoglobin 14.7, platelets 237, creatinine 2.2.  X-ray of left foot showed mild erosive changes of the lateral fifth digit concerning for osteomyelitis.  Ultrasound lower extremities was negative for DVT patient was started on vancomycin  and cefepime  and transferred for further management.  On evaluation patient was endorsing persistent pain.  He denied any systemic complaints of infection including fever or chills.  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   Review of Systems: Review of Systems  Constitutional: Negative.   HENT: Negative.    Eyes: Negative.   Respiratory: Negative.    Cardiovascular: Negative.   Gastrointestinal: Negative.   Genitourinary: Negative.   Musculoskeletal: Negative.   Skin: Negative.   Neurological: Negative.   Endo/Heme/Allergies: Negative.   Psychiatric/Behavioral: Negative.       As per HPI otherwise 10 point review of systems negative.   Allergies[1]  Past Medical History:  Diagnosis Date   Atrial fibrillation (HCC)    Cervical spinal stenosis    DDD (degenerative disc disease), cervical    DDD (degenerative disc disease), lumbar    Diabetes mellitus without complication (HCC)    Dyspnea    Hyperlipidemia    Hypertension    Peripheral artery disease     Past Surgical History:  Procedure Laterality Date   ABDOMINAL AORTOGRAM W/LOWER EXTREMITY N/A 04/30/2021    Procedure: ABDOMINAL AORTOGRAM W/LOWER EXTREMITY;  Surgeon: Magda Debby SAILOR, MD;  Location: [facility] IN[facility];  Service: Cardiovascular;  Laterality: N/A;   ABDOMINAL AORTOGRAM W/LOWER EXTREMITY N/A 10/25/2022   Procedure: ABDOMINAL AORTOGRAM W/LOWER EXTREMITY;  Surgeon: Gretta Lonni PARAS, MD;  Location: [facility];  Service: Cardiovascular;  Laterality: N/A;   AMPUTATION TOE     AMPUTATION TOE Left 05/04/2021   Procedure: LEFT GREAT TOE AMPUTATION;  Surgeon: Gretta Lonni PARAS, MD;  Location: [facility] OR;  Service: Vascular;  Laterality: Left;   AMPUTATION TOE Left 10/26/2022   Procedure: AMPUTATION LEFT SECOND TOE;  Surgeon: Joya Stabs, DPM;  Location: [facility] OR;  Service: Podiatry;  Laterality: Left;   BYPASS GRAFT FEMORAL-PERONEAL Left 05/04/2021   Procedure: LEFT COMMON FEMORAL-TIBIOPERONEAL TRUNK BYPASS;  Surgeon: Gretta Lonni PARAS, MD;  Location: [facility] OR;  Service: Vascular;  Laterality: Left;   ENDARTERECTOMY FEMORAL Left 05/04/2021   Procedure: LEFT COMMON FEMORAL ENDARTERECTOMY WITH BOVINE PATCH;  Surgeon: Gretta Lonni PARAS, MD;  Location: [facility] OR;  Service: Vascular;  Laterality: Left;   ENDARTERECTOMY TIBIOPERONEAL Left 05/04/2021   Procedure: LEFT BELOW KNEE POPITEAL WITH  TIBIOPERONEAL ENDARTERECTOMY;  Surgeon: Gretta Lonni PARAS, MD;  Location: [facility] OR;  Service: Vascular;  Laterality: Left;   ENDOVEIN HARVEST OF GREATER SAPHENOUS VEIN  05/04/2021   Procedure: HARVEST OF LEFT GREATER SAPHENOUS VEIN;  Surgeon: Gretta Lonni PARAS, MD;  Location: [facility] OR;  Service: Vascular;;   INSERTION OF ILIAC STENT Left 05/04/2021   Procedure: LEFT COMMON ILLIAC ATERY ANGIOPLASTY WITH STENT;  Surgeon: Gretta Lonni PARAS, MD;  Location: [facility] OR;  Service: Vascular;  Laterality: Left;   INTRAOPERATIVE ARTERIOGRAM Left 05/04/2021   Procedure: LEFT ARTERY ARTERIOGRAM;  Surgeon: Gretta Lonni PARAS, MD;  Location: [facility] OR;  Service: Vascular;  Laterality: Left;   IRRIGATION AND DEBRIDEMENT FOOT Left  05/04/2021   Procedure: IRRIGATION AND DEBRIDEMENT FOOT;  Surgeon: Gretta Lonni PARAS, MD;  Location: [facility] OR;  Service: Vascular;  Laterality: Left;   KNEE SURGERY Left    NOSE SURGERY     PERIPHERAL VASCULAR INTERVENTION  10/25/2022   Procedure: PERIPHERAL VASCULAR INTERVENTION;  Surgeon: Gretta Lonni PARAS, MD;  Location: [facility];  Service: Cardiovascular;;   TEE WITHOUT CARDIOVERSION N/A 05/01/2021   Procedure: TRANSESOPHAGEAL ECHOCARDIOGRAM (TEE);  Surgeon: Raford Riggs, MD;  Location: [facility] ENDOSCOPY;  Service: Cardiovascular;  Laterality: N/A;     reports that he quit smoking about 13 years ago. His smoking use included cigarettes. He started smoking about 58 years ago. He has a 45 pack-year smoking history. He has been exposed to tobacco smoke. He has never used smokeless tobacco. He reports that he does not currently use drugs. He reports that he does not drink alcohol.  Family History  Problem Relation Age of Onset   Stroke Mother    Heart disease Mother        CABG   Cancer Father     Prior to Admission medications  Medication Sig Start Date End Date Taking? Authorizing Provider  Accu-Chek Softclix Lancets lancets USE TO TEST BLOOD SUGAR FOUR TIMES DAILY 03/20/24   Bacchus, Gloria Z, FNP  atorvastatin  (LIPITOR ) 80 MG tablet TAKE 1 TABLET BY MOUTH DAILY 12/08/23   Bacchus, Gloria Z, FNP  Blood Glucose Monitoring Suppl (GNP TRUE METRIX GLUCOSE METER) w/Device KIT  11/02/22   [provider]  cefdinir (OMNICEF) 300 MG capsule Take 600 mg by mouth daily.    [provider]  clindamycin  (CLEOCIN ) 150 MG capsule Take 150 mg by mouth every 6 (six) hours. 01/22/24   [provider]  clopidogrel  (PLAVIX ) 75 MG tablet TAKE 1 TABLET BY MOUTH DAILY 07/09/24   Mallipeddi, Vishnu P, MD  Evolocumab  (REPATHA  SURECLICK) 140 MG/ML SOAJ Inject 140 mg into the skin every 14 (fourteen) days. Patient not taking: Reported on 01/30/2024 07/18/23   Edman Meade PEDLAR,  FNP  ezetimibe  (ZETIA ) 10 MG tablet TAKE 1 TABLET BY MOUTH EVERY DAY 08/24/23   Bacchus, Meade PEDLAR, FNP  FARXIGA  10 MG TABS tablet TAKE 1 TABLET BY MOUTH DAILY BEFORE BREAKFAST 06/08/24   Bacchus, Meade PEDLAR, FNP  fenofibrate  (TRICOR ) 145 MG tablet Take 1 tablet (145 mg total) by mouth daily. 07/18/24   Mallipeddi, Vishnu P, MD  furosemide  (LASIX ) 40 MG tablet TAKE 1 TABLET BY MOUTH TWICE DAILY 07/05/24   Bacchus, Gloria Z, FNP  gabapentin  (NEURONTIN ) 300 MG capsule TAKE ONE CAPSULE BY MOUTH FOUR TIMES DAILY 08/01/24   Bacchus, Gloria Z, FNP  glimepiride  (AMARYL ) 2 MG tablet TAKE 1 TABLET BY MOUTH DAILY BEFORE BREAKFAST 06/08/24   Bacchus, Gloria Z, FNP  glucose blood (TRUE METRIX BLOOD GLUCOSE TEST) test strip CHECK BLOOD SUGAR FOUR TIMES DAILY 11/21/23   Bacchus, Gloria Z, FNP  insulin  regular (HUMULIN R ) 100 units/mL injection INJECT 10 UNITS into THE SKIN THREE TIMES DAILY BEFORE meals. Discard AFTER 31 DAYS. 02/16/24   Bacchus, Gloria Z, FNP  Insulin  Syringe-Needle U-100 (SURE COMFORT INSULIN  SYRINGE) 31G X 5/16 0.3 ML MISC USE WITH INSULIN  EVERY DAY 03/21/24   Bacchus, Meade PEDLAR, FNP  metoprolol  tartrate (LOPRESSOR ) 25 MG tablet TAKE 1 TABLET BY MOUTH TWICE DAILY 03/22/24   Mallipeddi, Vishnu P, MD  pantoprazole  (PROTONIX ) 40 MG tablet TAKE 1 TABLET BY MOUTH DAILY 05/09/24   Bacchus, Gloria Z, FNP  Semaglutide ,0.25 or 0.5MG /DOS, (OZEMPIC , 0.25 OR 0.5 MG/DOSE,) 2 MG/3ML SOPN Inject 0.5 mg into the skin once a week. Patient not taking: Reported on 01/30/2024 07/18/23   Edman Meade PEDLAR, FNP  tamsulosin  (FLOMAX ) 0.4 MG CAPS capsule TAKE ONE CAPSULE BY MOUTH DAILY 02/16/24   Edman Meade PEDLAR, FNP    Physical Exam: Vitals:   08/24/24 1922  BP: (!) 151/82  Pulse: 88  Resp: 18  Temp: 97.9 F (36.6 C)  TempSrc: Oral  SpO2: 97%   Physical Exam Constitutional:      Appearance: He is normal weight.  HENT:     Head: Normocephalic.     Nose: Nose normal.     Mouth/Throat:     Mouth: Mucous membranes are  moist.     Pharynx: Oropharynx is clear.  Eyes:     Extraocular Movements: Extraocular movements intact.     Conjunctiva/sclera: Conjunctivae normal.     Pupils: Pupils are equal, round, and reactive to light.  Cardiovascular:     Rate and Rhythm: Normal rate and regular rhythm.     Pulses: Normal pulses.     Heart sounds: Normal heart sounds.  Pulmonary:     Effort: Pulmonary effort is normal.     Breath sounds: Normal breath sounds.  Abdominal:     General: Abdomen is flat. Bowel sounds are normal.  Musculoskeletal:        General: Normal range of motion.     Cervical back: Normal range of motion.  Skin:    General: Skin is warm.     Capillary Refill: Capillary refill takes less than 2 seconds.  Neurological:     General: No focal deficit present.     Mental Status: He is alert.  Psychiatric:        Mood and Affect: Mood normal.        Labs on Admission: I have personally reviewed the patients's labs and imaging studies.  Assessment/Plan Principal Problem:   Osteomyelitis (HCC)   # Acute osteo of left foot - Patient presumed purulent drainage - Had x-ray at outside hospital which showed possible bony erosion  Plan: Obtain MR foot Continue vancomycin  and cefepime  ABIs Consult podiatry in morning Wound consult  # Hyperlipidemia-continue Lipitor   # CAD-continue aspirin  Plavix   # Hyperlipidemia-continue Zetia , fibrate  # History of CHF, not in exacerbation-continue home Lasix   # Chronic pain-continue gabapentin   # Type 2 diabetes-continue Lantus , aspart  # Hypertension-continue metoprolol   # GERD-continue Protonix   # BPH-continue Flomax    Admission status: Inpatient Med-Surg  Certification: The appropriate patient status for this patient is INPATIENT. Inpatient status is judged to be reasonable and necessary in order to provide the required intensity of service to ensure the patient's safety. The patient's presenting symptoms, physical exam  findings, and initial radiographic and laboratory data in the context of their chronic comorbidities is felt to place them at high risk for further clinical deterioration. Furthermore, it is not anticipated that the patient will be medically stable for discharge from the hospital within 2 midnights of admission.   * I certify that at the point of admission it is my clinical judgment that the patient will require inpatient hospital care spanning beyond 2 midnights from the point of admission due to high  intensity of service, high risk for further deterioration and high frequency of surveillance required.DEWAINE Lamar Dess MD [facility] If 7PM-7AM, please contact night-coverage [email or web address]  08/24/2024, 9:29 PM         [1]  Allergies Allergen Reactions   Asa [Aspirin ] Hives   Penicillins Hives    Hives + vomiting as an adult  - tolerated Ceftriaxone  04/2021   Penicillin G    Tetanus Toxoid     unknown

## 2024-08-25 ENCOUNTER — Inpatient Hospital Stay (HOSPITAL_COMMUNITY)

## 2024-08-25 DIAGNOSIS — M869 Osteomyelitis, unspecified: Principal | ICD-10-CM | POA: Diagnosis present

## 2024-08-25 DIAGNOSIS — M86172 Other acute osteomyelitis, left ankle and foot: Secondary | ICD-10-CM

## 2024-08-25 LAB — COMPREHENSIVE METABOLIC PANEL WITH GFR
ALT: 30 U/L (ref 0–44)
AST: 30 U/L (ref 15–41)
Albumin: 2.8 g/dL — ABNORMAL LOW (ref 3.5–5.0)
Alkaline Phosphatase: 56 U/L (ref 38–126)
Anion gap: 7 (ref 5–15)
BUN: 19 mg/dL (ref 8–23)
CO2: 24 mmol/L (ref 22–32)
Calcium: 8.7 mg/dL — ABNORMAL LOW (ref 8.9–10.3)
Chloride: 106 mmol/L (ref 98–111)
Creatinine, Ser: 1.56 mg/dL — ABNORMAL HIGH (ref 0.61–1.24)
GFR, Estimated: 45 mL/min — ABNORMAL LOW (ref >60)
Glucose, Bld: 116 mg/dL — ABNORMAL HIGH (ref 70–99)
Potassium: 3.9 mmol/L (ref 3.5–5.1)
Sodium: 137 mmol/L (ref 135–145)
Total Bilirubin: 0.8 mg/dL (ref 0.0–1.2)
Total Protein: 6.1 g/dL — ABNORMAL LOW (ref 6.5–8.1)

## 2024-08-25 LAB — PROTIME-INR
INR: 1.1 (ref 0.8–1.2)
Prothrombin Time: 15.2 s (ref 11.4–15.2)

## 2024-08-25 LAB — HEMOGLOBIN A1C
Hgb A1c MFr Bld: 8.6 % — ABNORMAL HIGH (ref 4.8–5.6)
Mean Plasma Glucose: 200.12 mg/dL

## 2024-08-25 LAB — C-REACTIVE PROTEIN: CRP: 5.1 mg/dL — ABNORMAL HIGH (ref <1.0)

## 2024-08-25 LAB — CBC
HCT: 39.9 % (ref 39.0–52.0)
Hemoglobin: 13.9 g/dL (ref 13.0–17.0)
MCH: 31.2 pg (ref 26.0–34.0)
MCHC: 34.8 g/dL (ref 30.0–36.0)
MCV: 89.5 fL (ref 80.0–100.0)
Platelets: 242 K/uL (ref 150–400)
RBC: 4.46 MIL/uL (ref 4.22–5.81)
RDW: 13.9 % (ref 11.5–15.5)
WBC: 8.6 K/uL (ref 4.0–10.5)
nRBC: 0 % (ref 0.0–0.2)

## 2024-08-25 LAB — GLUCOSE, CAPILLARY
Glucose-Capillary: 104 mg/dL — ABNORMAL HIGH (ref 70–99)
Glucose-Capillary: 156 mg/dL — ABNORMAL HIGH (ref 70–99)
Glucose-Capillary: 93 mg/dL (ref 70–99)
Glucose-Capillary: 168 mg/dL — ABNORMAL HIGH (ref 70–99)

## 2024-08-25 LAB — SURGICAL PCR SCREEN
MRSA, PCR: NEGATIVE
Staphylococcus aureus: POSITIVE — AB

## 2024-08-25 MED ORDER — MUPIROCIN 2 % EX OINT
1.0000 | TOPICAL_OINTMENT | Freq: Two times a day (BID) | CUTANEOUS | Status: DC
  Administered 2024-08-25 – 2024-08-26 (×3): 1 via NASAL
  Filled 2024-08-25: qty 22

## 2024-08-25 NOTE — Progress Notes (Signed)
 PROGRESS NOTE  Todd Zhang  DOB: 02-17-1946  PCP: Edman Meade PEDLAR, FNP FMW:[id number]  DOA: 08/24/2024  LOS: 1 day  Hospital Day: 2  Subjective: Patient was seen and examined this morning. Elderly Caucasian male. Upset.  Thinks there is a lot of delay in his care.  He said' if you need to cut my toes, just cut them off ' Calmed down after a conversation. Pending MRI Afebrile, blood pressure 150s, breathing room air Labs this morning with CBC unremarkable,   Brief narrative: Todd Zhang is a 78 y.o. 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 Labs showed WBC 10.2, hemoglobin 14.7, platelets 237, creatinine 2.2.  X-ray of left foot showed mild erosive changes of the lateral fifth digit concerning for osteomyelitis.  Ultrasound lower extremities was negative for DVT. Patient was started on vancomycin  and cefepime  Transferred to [facility] for further management.    Assessment and plan: Acute osteomyelitis of left foot Present with left foot wound with reported purulent drainage X-ray obtained at outside hospital showed possible bony erosion. Pending MRI foot. Not sure if cultures were collected at outside hospital. Was started on IV cefepime  and IV vancomycin . Podiatry consult requested.  Type 2 diabetes mellitus A1c 7.7 on 06/14/2023 PTA meds-Lantus , aspart SSI, Farxiga  10 mg daily, glimepiride  2 mg daily Currently on Lantus  20 units daily, SSI/Accu-Cheks Recent Labs  Lab 08/25/24 0636  GLUCAP 104*   CHF Hypertension PTA meds- metoprolol  tartrate 25 mg twice daily, Lasix  40 mg twice daily, Farxiga  10 mg daily Currently continued on metoprolol  and Lasix   CAD PAD PTA meds- aspirin , Plavix  75 mg daily I would  keep them on hold for now. Lipid management as below  HLD PTA meds- Lipitor  80 mg daily, Zetia  10 mg daily, fenofibrate  145 mg daily, Repatha  Continue all  Chronic pain Degenerative disc disease Cervical/lumbar stenosis PTA meds- gabapentin  300 mg 4 times daily Continue  BPH Flomax  0.4 mg daily  GERD Protonix    Home med list has not been obtained/updated by [facility] at this time.  Please double check admission reconciliation.   Nutrition Status:         Mobility: Able to ambulate independently.  Was walking back from the bathroom when I saw him  PT Orders:   PT Follow up Rec:     Goals of care   Code Status: Full Code     DVT prophylaxis:  enoxaparin  (LOVENOX ) injection 40 mg Start: 08/24/24 2215 SCDs Start: 08/24/24 2118   Antimicrobials: IV cefepime  was started on admission Fluid: None Consultants: Podiatry Family Communication: None at bedside  Status: Inpatient Level of care:  Med-Surg   Patient is from: Home Needs to continue in-hospital care: Pending MRI foot and podiatry well Anticipated d/c to: Pending clinical course      Diet:  Diet Order             Diet NPO time specified  Diet effective midnight           Diet heart healthy/carb modified Fluid consistency: Thin  Diet effective now                   Scheduled Meds:  atorvastatin   80 mg Oral Daily   enoxaparin  (LOVENOX ) injection  40  mg Subcutaneous Q24H   ezetimibe   10 mg Oral Daily   fenofibrate   160 mg Oral Daily   furosemide   40 mg Oral BID   gabapentin   300 mg Oral QID   insulin  aspart  0-15 Units Subcutaneous TID WC   insulin  glargine  20 Units Subcutaneous Daily   metoprolol  tartrate  25 mg Oral BID   mupirocin  ointment  1 Application Nasal BID   pantoprazole   40 mg Oral Daily   tamsulosin   0.4 mg Oral Daily    PRN meds: acetaminophen  **OR** acetaminophen , ondansetron  **OR** ondansetron  (ZOFRAN ) IV   Infusions:   ceFEPime  (MAXIPIME ) IV Stopped (08/25/24  0534)    Antimicrobials: Anti-infectives (From admission, onward)    Start     Dose/Rate Route Frequency Ordered Stop   08/25/24 0500  ceFEPIme  (MAXIPIME ) 2 g in sodium chloride  0.9 % 100 mL IVPB        2 g 200 mL/hr over 30 Minutes Intravenous Every 12 hours 08/24/24 2206         Objective: Vitals:   08/25/24 0601 08/25/24 0802  BP: (!) 152/95 124/68  Pulse: 82 84  Resp: 19 18  Temp: 98 F (36.7 C) 97.6 F (36.4 C)  SpO2: 90% 96%    Intake/Output Summary (Last 24 hours) at 08/25/2024 1053 Last data filed at 08/25/2024 0900 Gross per 24 hour  Intake 100 ml  Output 1 ml  Net 99 ml   Filed Weights   08/24/24 2200  Weight: 103.4 kg   Weight change:  Body mass index is 30.91 kg/m.   Physical Exam: General exam: Pleasant, elderly Caucasian male Skin: No rashes, lesions or ulcers. HEENT: Atraumatic, normocephalic, no obvious bleeding Lungs: Clear to auscultation bilaterally,  CVS: S1, S2, no murmur,   GI/Abd: Soft, nontender, nondistended, bowel sound present,   CNS: Alert, awake, oriented x 3 Psychiatry: Upset Extremities: Left leg seems to swollen.   Data Review: I have personally reviewed the laboratory data and studies available.  F/u labs ordered Unresulted Labs (From admission, onward)     Start     Ordered   08/26/24 0500  Basic metabolic panel with GFR  Tomorrow morning,   R        08/25/24 1053   08/26/24 0500  CBC with Differential/Platelet  Tomorrow morning,   R        08/25/24 1053            Signed, Chapman Rota, MD [facility] 08/25/2024

## 2024-08-25 NOTE — Progress Notes (Signed)
 Upon shift change this clinical research associate was notified that this patient has a significant amount of money at bedside. When asked the patient about this he states he has about 2600 dollars with him hidden away, and refused to say where, this clinical research associate educated him regarding the safety of allowing us  to lock it down in security, patient refused and stated if he tells us  where the money is it wouldn't be hidden anymore and that the good lord is watching it charge nurse made aware.

## 2024-08-25 NOTE — Consult Note (Addendum)
 PODIATRY CONSULTATION  NAME Todd Zhang MRN [id number] DOB 1946/07/14 DOA 08/24/2024   Reason for consult: Osteomyelitis fifth toe left foot  History of present illness: 78 y.o. male PMHx T2DM, A-fib, PAD w/ history of multiple toe amputations, admitted for worsening infection with erosive changes to the fifth digit of the left foot concerning for osteomyelitis.  Patient admitted.  Podiatry consulted  Past Medical History:  Diagnosis Date   Atrial fibrillation (HCC)    Cervical spinal stenosis    DDD (degenerative disc disease), cervical    DDD (degenerative disc disease), lumbar    Diabetes mellitus without complication (HCC)    Dyspnea    Hyperlipidemia    Hypertension    Peripheral artery disease        Latest Ref Rng & Units 08/25/2024    7:19 AM 06/14/2023    9:07 AM 03/16/2023    8:03 AM  CBC  WBC 4.0 - 10.5 K/uL 8.6  6.6  8.1   Hemoglobin 13.0 - 17.0 g/dL 86.0  84.7  85.8   Hematocrit 39.0 - 52.0 % 39.9  43.7  41.8   Platelets 150 - 400 K/uL 242  215  239        Latest Ref Rng & Units 08/25/2024    7:19 AM 06/14/2023    9:07 AM 05/25/2023   11:27 AM  BMP  Glucose 70 - 99 mg/dL 883  836  753   BUN 8 - 23 mg/dL 19  47  44   Creatinine 0.61 - 1.24 mg/dL 8.43  7.52  7.78   Sodium 135 - 145 mmol/L 137  134  134   Potassium 3.5 - 5.1 mmol/L 3.9  4.2  4.1   Chloride 98 - 111 mmol/L 106  94  96   CO2 22 - 32 mmol/L 24  28  28    Calcium  8.9 - 10.3 mg/dL 8.7  9.4  9.1     Physical Exam: General: The patient is alert and oriented x3 in no acute distress.   VAS US  ABI WITH/WO TBI (Order [id number]) Vascular Ultrasound Date: 08/24/2024 Department: [facility] Released By/Authorizing: Dena Charleston, MD (auto-released)  Not completed.  Pending  Neurological: Light touch and protective threshold absent   [facility] Outside Information XR Foot 3 Or More Views Left 08/23/2024 Anatomical Region Laterality Modality  Foot  bilateral Computed Radiography  Impression Mild erosive changes along the lateral fifth digit, which may represent osteomyelitis.  MR FOOT LEFT WO CONTRAST (Order [id number]) Imaging Date: 08/24/2024 Department: [facility] Released By/Authorizing: Dena Charleston, MD (auto-released)  Not completed.  Pending  Musculoskeletal: History of prior amputation to the digits 1-3 of the left foot now with osteomyelitis to the fifth digit.  Patient ambulatory   ASSESSMENT/PLAN OF CARE Osteomyelitis fifth digit left foot H/o prior toe amputations 1-3 LT foot  -Patient seen at bedside.  He is very frustrated because he reports that he has been in the hospital now for about 4 days -Recommended transmetatarsal amputation however the patient refused.  He would rather only have the 4th and 5th toes amputated and not proceed with any revisional forefoot surgery that would shorten the forefoot.  Main concern was due to balance postoperatively.  He is amenable to amputation of the 4th and 5th toes however -Surgery tentatively scheduled for tomorrow, 08/26/2024, pending or availability -Preoperative orders placed.  Surgery will consist of left 4th and 5th toe amputation -  NPO after midnight  -MRI canceled.  The infection appears to be localized to the fifth digit.  I do not anticipate any necessity to the MRI and this will only delay his surgery and he is anxious to leave the hospital.  I anticipate that from a podiatry standpoint he would be okay to be discharged postoperatively tomorrow -Will follow    Thank you for the consult.  Please contact me directly via secure chat with any questions or concerns.     Thresa EMERSON Sar, DPM [facility]  Dr. Thresa EMERSON Sar, DPM    [address] [place], [place] [address]                Office [phone]  Fax [phone]

## 2024-08-25 NOTE — Consult Note (Signed)
 WOC Nurse Consult Note: Reason for Consult: l foot wound  Wound type: full thickness L 5th digit likely r/t PAD; has a history of great toe amputation in the past  Pressure Injury POA: NA not pressure  Measurement: see nursing flowsheet  Wound bed: dark red tissue  Drainage (amount, consistency, odor) purulent per MD note  Periwound: old healed amputation R great toe noted  Dressing procedure/placement/frequency: Cleanse L 5th digit with Betadine, apply Xeroform gauze ([id number] [phone]) to toe daily and secure with dry gauze/kerlix roll gauze.   Patient with osteomyelitis in this digit.  Osteomyelitis is out of the scope of WOC nurse. Podiatry has been consulted with will manage this wound ongoing.  Reconsult if further needs arise.   Thank you,    Powell Bar MSN, RN-BC, [employer]

## 2024-08-26 ENCOUNTER — Encounter (HOSPITAL_COMMUNITY): Payer: Self-pay | Admitting: Internal Medicine

## 2024-08-26 ENCOUNTER — Inpatient Hospital Stay (HOSPITAL_COMMUNITY)

## 2024-08-26 ENCOUNTER — Other Ambulatory Visit: Payer: Self-pay

## 2024-08-26 ENCOUNTER — Other Ambulatory Visit (HOSPITAL_COMMUNITY): Payer: Self-pay

## 2024-08-26 ENCOUNTER — Encounter (HOSPITAL_COMMUNITY)
Admission: EM | Disposition: A | Discharge status: Home / Self Care | Payer: Self-pay | Source: Other Acute Inpatient Hospital | Attending: Internal Medicine

## 2024-08-26 DIAGNOSIS — M869 Osteomyelitis, unspecified: Secondary | ICD-10-CM | POA: Diagnosis not present

## 2024-08-26 DIAGNOSIS — M86172 Other acute osteomyelitis, left ankle and foot: Secondary | ICD-10-CM

## 2024-08-26 LAB — CBC WITH DIFFERENTIAL/PLATELET
Abs Immature Granulocytes: 0.02 K/uL (ref 0.00–0.07)
Basophils Absolute: 0 K/uL (ref 0.0–0.1)
Basophils Relative: 0 %
Eosinophils Absolute: 0.2 K/uL (ref 0.0–0.5)
Eosinophils Relative: 3 %
HCT: 39.9 % (ref 39.0–52.0)
Hemoglobin: 13.8 g/dL (ref 13.0–17.0)
Immature Granulocytes: 0 %
Lymphocytes Relative: 32 %
Lymphs Abs: 2.2 K/uL (ref 0.7–4.0)
MCH: 30.5 pg (ref 26.0–34.0)
MCHC: 34.6 g/dL (ref 30.0–36.0)
MCV: 88.3 fL (ref 80.0–100.0)
Monocytes Absolute: 0.6 K/uL (ref 0.1–1.0)
Monocytes Relative: 9 %
Neutro Abs: 3.9 K/uL (ref 1.7–7.7)
Neutrophils Relative %: 56 %
Platelets: 253 K/uL (ref 150–400)
RBC: 4.52 MIL/uL (ref 4.22–5.81)
RDW: 14 % (ref 11.5–15.5)
WBC: 7 K/uL (ref 4.0–10.5)
nRBC: 0 % (ref 0.0–0.2)

## 2024-08-26 LAB — BASIC METABOLIC PANEL WITH GFR
Anion gap: 7 (ref 5–15)
BUN: 22 mg/dL (ref 8–23)
CO2: 29 mmol/L (ref 22–32)
Calcium: 8.7 mg/dL — ABNORMAL LOW (ref 8.9–10.3)
Chloride: 101 mmol/L (ref 98–111)
Creatinine, Ser: 1.65 mg/dL — ABNORMAL HIGH (ref 0.61–1.24)
GFR, Estimated: 42 mL/min — ABNORMAL LOW (ref >60)
Glucose, Bld: 151 mg/dL — ABNORMAL HIGH (ref 70–99)
Potassium: 3.7 mmol/L (ref 3.5–5.1)
Sodium: 137 mmol/L (ref 135–145)

## 2024-08-26 LAB — GLUCOSE, CAPILLARY
Glucose-Capillary: 134 mg/dL — ABNORMAL HIGH (ref 70–99)
Glucose-Capillary: 121 mg/dL — ABNORMAL HIGH (ref 70–99)
Glucose-Capillary: 193 mg/dL — ABNORMAL HIGH (ref 70–99)

## 2024-08-26 SURGERY — AMPUTATION, TOE
Anesthesia: Monitor Anesthesia Care | Site: Toe | Laterality: Left

## 2024-08-26 MED ORDER — DEXAMETHASONE SOD PHOSPHATE PF 10 MG/ML IJ SOLN
INTRAMUSCULAR | Status: DC | PRN
  Administered 2024-08-26: 5 mg via INTRAVENOUS

## 2024-08-26 MED ORDER — ORAL CARE MOUTH RINSE: 15.0000 mL | Freq: Once | OROMUCOSAL | Status: AC

## 2024-08-26 MED ORDER — LIDOCAINE HCL 2 % IJ SOLN
INTRAMUSCULAR | Status: DC | PRN
  Administered 2024-08-26: 5 mL

## 2024-08-26 MED ORDER — PROPOFOL 10 MG/ML IV BOLUS
INTRAVENOUS | Status: AC
  Filled 2024-08-26: qty 20

## 2024-08-26 MED ORDER — PHENYLEPHRINE 80 MCG/ML (10ML) SYRINGE FOR IV PUSH (FOR BLOOD PRESSURE SUPPORT)
PREFILLED_SYRINGE | INTRAVENOUS | Status: AC
  Filled 2024-08-26: qty 10

## 2024-08-26 MED ORDER — FENTANYL CITRATE (PF) 100 MCG/2ML IJ SOLN
INTRAMUSCULAR | Status: DC | PRN
  Administered 2024-08-26: 50 ug via INTRAVENOUS

## 2024-08-26 MED ORDER — BUPIVACAINE HCL (PF) 0.5 % IJ SOLN
INTRAMUSCULAR | Status: AC
  Filled 2024-08-26: qty 30

## 2024-08-26 MED ORDER — EPHEDRINE 5 MG/ML INJ
INTRAVENOUS | Status: AC
  Filled 2024-08-26: qty 5

## 2024-08-26 MED ORDER — BUPIVACAINE HCL (PF) 0.5 % IJ SOLN
INTRAMUSCULAR | Status: DC | PRN
  Administered 2024-08-26: 5 mL

## 2024-08-26 MED ORDER — TRAZODONE 25 MG HALF TABLET: 25.0000 mg | ORAL_TABLET | Freq: Every evening | ORAL | Status: DC | PRN

## 2024-08-26 MED ORDER — OXYCODONE-ACETAMINOPHEN 5-325 MG PO TABS
1.0000 | ORAL_TABLET | Freq: Four times a day (QID) | ORAL | 0 refills | Status: AC | PRN
  Filled 2024-08-26: qty 20, 5d supply, fill #0

## 2024-08-26 MED ORDER — CHLORHEXIDINE GLUCONATE 0.12 % MT SOLN
15.0000 mL | Freq: Once | OROMUCOSAL | Status: AC
  Administered 2024-08-26: 15 mL via OROMUCOSAL

## 2024-08-26 MED ORDER — CEFADROXIL 500 MG PO CAPS
500.0000 mg | ORAL_CAPSULE | Freq: Two times a day (BID) | ORAL | Status: DC
  Administered 2024-08-26: 500 mg via ORAL
  Filled 2024-08-26: qty 1

## 2024-08-26 MED ORDER — ONDANSETRON HCL 4 MG/2ML IJ SOLN
INTRAMUSCULAR | Status: AC
  Filled 2024-08-26: qty 2

## 2024-08-26 MED ORDER — FENTANYL CITRATE (PF) 100 MCG/2ML IJ SOLN: 25.0000 ug | INTRAMUSCULAR | Status: DC | PRN

## 2024-08-26 MED ORDER — PROPOFOL 500 MG/50ML IV EMUL
INTRAVENOUS | Status: DC | PRN
  Administered 2024-08-26: 125 ug/kg/min via INTRAVENOUS

## 2024-08-26 MED ORDER — GLYCOPYRROLATE 0.2 MG/ML IJ SOLN
INTRAMUSCULAR | Status: DC | PRN
  Administered 2024-08-26: .2 mg via INTRAVENOUS

## 2024-08-26 MED ORDER — ONDANSETRON HCL 4 MG/2ML IJ SOLN
INTRAMUSCULAR | Status: DC | PRN
  Administered 2024-08-26: 4 mg via INTRAVENOUS

## 2024-08-26 MED ORDER — INSULIN ASPART 100 UNIT/ML IJ SOLN: 0.0000 [IU] | INTRAMUSCULAR | Status: DC | PRN

## 2024-08-26 MED ORDER — CEFADROXIL 500 MG PO CAPS: 1000.0000 mg | ORAL_CAPSULE | Freq: Two times a day (BID) | ORAL | Status: DC

## 2024-08-26 MED ORDER — CEFADROXIL 500 MG PO CAPS
500.0000 mg | ORAL_CAPSULE | Freq: Two times a day (BID) | ORAL | 0 refills | Status: AC
  Filled 2024-08-26: qty 14, 7d supply, fill #0

## 2024-08-26 MED ORDER — LIDOCAINE HCL 2 % IJ SOLN
INTRAMUSCULAR | Status: AC
  Filled 2024-08-26: qty 20

## 2024-08-26 MED ORDER — ACETAMINOPHEN 10 MG/ML IV SOLN
INTRAVENOUS | Status: AC
  Filled 2024-08-26: qty 100

## 2024-08-26 MED ORDER — CHLORHEXIDINE GLUCONATE 0.12 % MT SOLN
OROMUCOSAL | Status: AC
  Filled 2024-08-26: qty 15

## 2024-08-26 MED ORDER — 0.9 % SODIUM CHLORIDE (POUR BTL) OPTIME
TOPICAL | Status: DC | PRN
  Administered 2024-08-26: 1000 mL

## 2024-08-26 MED ORDER — FENTANYL CITRATE (PF) 100 MCG/2ML IJ SOLN
INTRAMUSCULAR | Status: AC
  Filled 2024-08-26: qty 2

## 2024-08-26 MED ORDER — ACETAMINOPHEN 10 MG/ML IV SOLN
INTRAVENOUS | Status: DC | PRN
  Administered 2024-08-26: 1000 mg via INTRAVENOUS

## 2024-08-26 MED ORDER — PROPOFOL 10 MG/ML IV BOLUS
INTRAVENOUS | Status: DC | PRN
  Administered 2024-08-26: 30 mg via INTRAVENOUS

## 2024-08-26 MED ORDER — LACTATED RINGERS IV SOLN: INTRAVENOUS | Status: DC

## 2024-08-26 MED ORDER — LIDOCAINE 2% (20 MG/ML) 5 ML SYRINGE
INTRAMUSCULAR | Status: AC
  Filled 2024-08-26: qty 5

## 2024-08-26 SURGICAL SUPPLY — 37 items
BAG COUNTER SPONGE SURGICOUNT (BAG) ×1 IMPLANT
BLADE AVERAGE 25X9 (BLADE) IMPLANT
BLADE SURG 15 STRL LF DISP TIS (BLADE) IMPLANT
BNDG COMPR ESMARK 4X3 LF (GAUZE/BANDAGES/DRESSINGS) IMPLANT
BNDG ELASTIC 4INX 5YD STR LF (GAUZE/BANDAGES/DRESSINGS) IMPLANT
BNDG ELASTIC 4X5.8 VLCR STR LF (GAUZE/BANDAGES/DRESSINGS) IMPLANT
BNDG GAUZE DERMACEA FLUFF 4 (GAUZE/BANDAGES/DRESSINGS) ×1 IMPLANT
CHLORAPREP W/TINT 26 (MISCELLANEOUS) IMPLANT
COVER SURGICAL LIGHT HANDLE (MISCELLANEOUS) ×1 IMPLANT
CUFF TOURN SGL QUICK 18X4 (TOURNIQUET CUFF) ×1 IMPLANT
CUFF TRNQT CYL 24X4X16.5-23 (TOURNIQUET CUFF) IMPLANT
ELECTRODE REM PT RTRN 9FT ADLT (ELECTROSURGICAL) IMPLANT
GAUZE PAD ABD 8X10 STRL (GAUZE/BANDAGES/DRESSINGS) ×1 IMPLANT
GAUZE SPONGE 4X4 12PLY STRL (GAUZE/BANDAGES/DRESSINGS) ×1 IMPLANT
GAUZE XEROFORM 1X8 LF (GAUZE/BANDAGES/DRESSINGS) ×1 IMPLANT
GLOVE BIO SURGEON STRL SZ8 (GLOVE) ×1 IMPLANT
GLOVE BIOGEL PI IND STRL 8 (GLOVE) ×1 IMPLANT
GOWN STRL REUS W/ TWL LRG LVL3 (GOWN DISPOSABLE) ×2 IMPLANT
KIT BASIN OR (CUSTOM PROCEDURE TRAY) ×1 IMPLANT
KIT TURNOVER KIT B (KITS) ×1 IMPLANT
NDL PRECISIONGLIDE 27X1.5 (NEEDLE) ×1 IMPLANT
NEEDLE PRECISIONGLIDE 27X1.5 (NEEDLE) ×1 IMPLANT
PACK ORTHO EXTREMITY (CUSTOM PROCEDURE TRAY) ×1 IMPLANT
PAD ARMBOARD POSITIONER FOAM (MISCELLANEOUS) ×2 IMPLANT
PAD CAST 4YDX4 CTTN HI CHSV (CAST SUPPLIES) ×1 IMPLANT
PENCIL BUTTON HOLSTER BLD 10FT (ELECTRODE) IMPLANT
SOL PREP POV-IOD 4OZ 10% (MISCELLANEOUS) ×1 IMPLANT
SOLN 0.9% NACL POUR BTL 1000ML (IV SOLUTION) ×1 IMPLANT
STAPLER SKIN PROX 35W (STAPLE) IMPLANT
SUT PROLENE 4 0 PS 2 18 (SUTURE) IMPLANT
SUT VIC AB 3-0 PS2 18XBRD (SUTURE) IMPLANT
SUT VIC AB 4-0 PS2 18 (SUTURE) IMPLANT
SYR CONTROL 10ML LL (SYRINGE) ×1 IMPLANT
TOWEL GREEN STERILE (TOWEL DISPOSABLE) ×1 IMPLANT
TOWEL GREEN STERILE FF (TOWEL DISPOSABLE) ×1 IMPLANT
TUBE CONNECTING 12X1/4 (SUCTIONS) ×1 IMPLANT
YANKAUER SUCT BULB TIP NO VENT (SUCTIONS) IMPLANT

## 2024-08-26 NOTE — Transfer of Care (Signed)
 Immediate Anesthesia Transfer of Care Note  Patient: Todd Zhang  Procedure(s) Performed: AMPUTATION, TOE (Left: Toe)  Patient Location: PACU  Anesthesia Type:MAC  Level of Consciousness: drowsy  Airway & Oxygen Therapy: Patient Spontanous Breathing  Post-op Assessment: Report given to RN and Post -op Vital signs reviewed and stable  Post vital signs: Reviewed and stable  Last Vitals:  Vitals Value Taken Time  BP    Temp    Pulse    Resp    SpO2      Last Pain:  Vitals:   08/26/24 0647  TempSrc: Oral  PainSc: 6       Patients Stated Pain Goal: 0 (08/26/24 0035)  Complications: No notable events documented.

## 2024-08-26 NOTE — Progress Notes (Signed)
 Patient had a lot of cash ([phone]), gold ring and bracelet, gold coins which was counted and placed in a packet and sealed infront of patient and taken downstairs to security for safe keeping before patient was transported to OR

## 2024-08-26 NOTE — Progress Notes (Signed)
 Patient belongings include money and jewelry picked up from security office and handing over to patient. Money count verified and witnessed by Ernie Peak

## 2024-08-26 NOTE — Anesthesia Postprocedure Evaluation (Signed)
 Anesthesia Post Note  Patient: Todd Zhang  Procedure(s) Performed: AMPUTATION, TOE (Left: Toe)     Patient location during evaluation: PACU Anesthesia Type: MAC Level of consciousness: awake and alert Pain management: pain level controlled Vital Signs Assessment: post-procedure vital signs reviewed and stable Respiratory status: spontaneous breathing, nonlabored ventilation, respiratory function stable and patient connected to nasal cannula oxygen Cardiovascular status: stable and blood pressure returned to baseline Postop Assessment: no apparent nausea or vomiting Anesthetic complications: no   No notable events documented.  Last Vitals:  Vitals:   08/26/24 0915 08/26/24 1034  BP: 123/79 (!) 142/99  Pulse:  86  Resp:  17  Temp: 36.4 C 36.6 C  SpO2:  99%    Last Pain:  Vitals:   08/26/24 1034  TempSrc: Oral  PainSc:                  Chandler Stofer,W. EDMOND

## 2024-08-26 NOTE — Evaluation (Signed)
 Physical Therapy Evaluation Patient Details Name: Todd Zhang MRN: [id number] DOB: 1945-11-16 Today's Date: 08/26/2024  History of Present Illness  Todd Zhang is a 78 y.o. male who underwent L 4th and 5th toe amputations 12/14 due to osteomyelitis. PMH: DM2, HTN, HLD, PAD s/p prior left 1st, 2nd & 3rd toe amputation 2024, A-fib, cervical and lumbar degenerative disc disease, cervical spine stenosis  Clinical Impression  Pt admitted with above. PTA pt indep without AD, drives, and lives alone. Pt particular in ways and resistant to PT education and MD recommendations. Pt refusing to wear post op shoe as that was what caused this wound and get my toes cut off. Dr. Janit made aware. Pt educated on minimal ambulation due to MD orders for minimal WBing on L LE however pt observed ambulating in room without AD with increased L LE WBing despite education. Pt fitted for axillary crutches and pt returned demonstrated stable ambulation despite impaired sequencing however pt set in ways. Pt with no further acute PT needs at this time. All education completed however unfortunately I feel patient will be non-compliant. Pt with no follow up PT needs. PT SIGNING OFF. Please re-consult if needed in future.      If plan is discharge home, recommend the following: Assist for transportation   Can travel by private vehicle        Equipment Recommendations Crutches (set to 5'8 and hand grip on second hole from the top)  Recommendations for Other Services       Functional Status Assessment Patient has had a recent decline in their functional status and demonstrates the ability to make significant improvements in function in a reasonable and predictable amount of time.     Precautions / Restrictions Precautions Precautions: Fall Required Braces or Orthoses: Other Brace Other Brace: MD ordered post op shoe however pt refusing, MD made aware Restrictions Weight Bearing Restrictions Per Provider Order:  Yes LLE Weight Bearing Per Provider Order: Weight bearing as tolerated (minimal, for transfers/short distance amb only)      Mobility  Bed Mobility Overal bed mobility: Independent                  Transfers Overall transfer level: Modified independent Equipment used: None               General transfer comment: pt observed getting up on own without crutches, pt also able to stand up with crutches    Ambulation/Gait Ambulation/Gait assistance: Contact guard assist Gait Distance (Feet): 30 Feet Assistive device: Crutches Gait Pattern/deviations: Step-to pattern, Decreased stride length, Decreased stance time - left Gait velocity: dec     General Gait Details: pt quick to move, resistant to PT cues stating I was born on crutches. 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 I know what I'm doing Pt educated on minimal WBing and walking to allow for L foot to heal  Stairs            Wheelchair Mobility     Tilt Bed    Modified Rankin (Stroke Patients Only)       Balance Overall balance assessment: Mild deficits observed, not formally tested                                           Pertinent Vitals/Pain Pain Assessment Pain Assessment: Faces Faces Pain Scale: Hurts even more Pain Location: surgical site on L  foot Pain Descriptors / Indicators: Sore Pain Intervention(s): Premedicated before session    Home Living Family/patient expects to be discharged to:: Private residence Living Arrangements: Alone Available Help at Discharge: Family;Available PRN/intermittently Type of Home: Apartment Home Access: Level entry       Home Layout: One level Home Equipment: Agricultural Consultant (2 wheels)      Prior Function Prior Level of Function : Independent/Modified Independent             Mobility Comments: no AD ADLs Comments: indep     Extremity/Trunk Assessment   Upper Extremity Assessment Upper Extremity Assessment: Overall WFL for tasks assessed     Lower Extremity Assessment Lower Extremity Assessment: Overall WFL for tasks assessed    Cervical / Trunk Assessment Cervical / Trunk Assessment: Normal  Communication   Communication Communication: No apparent difficulties    Cognition Arousal: Alert Behavior During Therapy: Restless (eager to go home, resistent to recommendations)   PT - Cognitive impairments: No family/caregiver present to determine baseline                       PT - Cognition Comments: suspect pt at baseline and that impulsivity and decreased insight to deficits, situation, and safety are baseline for this patient. Following commands: Intact       Cueing Cueing Techniques: Verbal cues     General Comments General comments (skin integrity, edema, etc.): VSS    Exercises General Exercises - Lower Extremity Ankle Circles/Pumps: AROM, Both, 10 reps, Supine Long Arc Quad: AROM, Both, 10 reps, Seated   Assessment/Plan    PT Assessment Patient does not need any further PT services (as pt particular in his ways and resistant to PT education and MD recommendations)  PT Problem List         PT Treatment Interventions      PT Goals (Current goals can be found in the Care Plan section)  Acute Rehab PT Goals Patient Stated Goal: home ASAP PT Goal Formulation: All assessment and education complete, DC therapy    Frequency       Co-evaluation               AM-PAC PT 6 Clicks Mobility  Outcome Measure Help needed turning from your back to your side while in a flat bed without using bedrails?: None Help needed moving from lying on your back to sitting on the side of a flat bed without using bedrails?: None Help needed moving to and from a bed to a chair (including a wheelchair)?: None Help needed standing up from a chair using your arms (e.g., wheelchair or bedside chair)?: None Help needed to walk in hospital room?: A Little Help needed climbing 3-5 steps with a railing? : A  Little 6 Click Score: 22    End of Session Equipment Utilized During Treatment:  (pt refused gait belt) Activity Tolerance: Patient tolerated treatment well Patient left: in bed;with call bell/phone within reach Nurse Communication: Mobility status (refusal of post op shoe) PT Visit Diagnosis: Difficulty in walking, not elsewhere classified (R26.2)    Time: 8875-8858 PT Time Calculation (min) (ACUTE ONLY): 17 min   Charges:   PT Evaluation $PT Eval Low Complexity: 1 Low   PT General Charges $$ ACUTE PT VISIT: 1 Visit         Norene Ames, PT, DPT Acute Rehabilitation Services Secure chat preferred Office #: [phone]   Norene CHRISTELLA Ames 08/26/2024, 11:59 AM

## 2024-08-26 NOTE — Progress Notes (Signed)
 Physician Discharge Summary  Todd Zhang FMW:[id number] DOB: [birth year] DOA: 08/24/2024  PCP: Edman Meade PEDLAR, FNP  Admit date: 08/24/2024 Discharge date: 08/26/2024  Admitted from: Home Discharge disposition: Home  Recommendations at discharge:  Complete course of antibiotics with oral cefadroxil  5 mg twice daily Per podiatry, dressing clean and dry until f/u in office 1 week.   Subjective: Patient was seen and examined this morning. Lying down in bed.  Underwent surgery today. Feels better and wants to go home. Seen by PT.  Patient refused to wear postop shoe as advised.  He ambulated fine with crutches. Blood from this morning with BUN/creatinine 22/1.65  Brief narrative: Todd Zhang is a 78 y.o. male with PMH significant for DM2, HTN, HLD, PAD s/p prior left 1st, 2nd & 3rd toe amputation 2024, A-fib, cervical and lumbar degenerative disc disease, cervical spine stenosis,  12/12: Patient presented to the ED at [facility] with a progressively worsening left toe wound, purulent drainage. Labs showed WBC 10.2, hemoglobin 14.7, platelets 237, creatinine 2.2.  X-ray of left foot showed mild erosive changes of the lateral fifth digit concerning for osteomyelitis.  Ultrasound lower extremities was negative for DVT. Patient was started on vancomycin  and cefepime  Transferred to [facility] for further management.   Hospital course: Acute osteomyelitis of left 4th toe Presented with left foot wound with reported purulent drainage X-ray obtained at outside hospital showed possible bony erosion. Empirically started on IV cefepime  and IV vancomycin  Seen by podiatry Podiatry recommended a transmetatarsal amputation however patient refused.  He only agreed to amputation of 4th and 5th toe. 12/14, underwent left 4th and 5th toe amputation Postoperatively doing well.  Seen by PT. Patient refused to wear postop shoe as advised.  He ambulated fine with crutches.  Per podiatry  margins appeared clean. Okay to be discharged today on 7 days of oral antibiotics Dressing clean and dry until f/u in office 1 week.  CKD 3A Creatinine at baseline  Recent Labs    08/25/24 0719 08/26/24 0345  BUN 19 22  CREATININE 1.56* 1.65*  CO2 24 29   Type 2 diabetes mellitus A1c 7.7 on 06/14/2023 PTA meds-Lantus , aspart SSI, Farxiga  10 mg daily, glimepiride  2 mg daily Continue same regimen as before Recent Labs  Lab 08/25/24 1641 08/25/24 2120 08/26/24 0610 08/26/24 0849 08/26/24 1120  GLUCAP 93 168* 134* 121* 193*   CHF Hypertension PTA meds- metoprolol  tartrate 25 mg twice daily, Lasix  40 mg twice daily, Farxiga  10 mg daily Continue same regimen  CAD PAD PTA meds- Plavix  75 mg daily Was held for surgery.  Per podiatry, okay to resume Plavix  from tomorrow Lipid management as below  HLD PTA meds- Lipitor  80 mg daily, Zetia  10 mg daily, fenofibrate  145 mg daily, Repatha  Continue all  Chronic pain Degenerative disc disease Cervical/lumbar stenosis PTA meds- gabapentin  300 mg 4 times daily Continue  BPH Flomax  0.4 mg daily  GERD Protonix   Goals of care   Code Status: Full Code   Diet:  Diet Order             Diet regular Room service appropriate? Yes; Fluid consistency: Thin  Diet effective now           Diet general                   Nutritional status:  Body mass index is 30.92 kg/m.       Wounds:  - Wound 08/26/24 9178 Surgical Closed Surgical Incision  Foot Left (Active)  Date First Assessed/Time First Assessed: 08/26/24 0821   Primary Wound Type: Surgical  Secondary Wound Type - Surgical: Closed Surgical Incision  Location: Foot  Location Orientation: Left    Assessments 08/26/2024  8:48 AM 08/26/2024  9:55 AM  Site / Wound Assessment Dressing in place / Unable to assess Dressing in place / Unable to assess  Drainage Amount None --  Dressing Type Compression wrap Compression wrap  Dressing Changed New --  Dressing Status  Clean, Dry, Intact Clean, Dry, Intact     No associated orders.    Discharge Medications:   Allergies as of 08/26/2024       Reactions   Asa [aspirin ] Hives   Penicillins Hives   Hives + vomiting as an adult  - tolerated Ceftriaxone  04/2021   Tetanus Toxoid Other (See Comments)   unknown        Medication List     TAKE these medications    atorvastatin  80 MG tablet Commonly known as: LIPITOR  TAKE 1 TABLET BY MOUTH DAILY   cefadroxil  500 MG capsule Commonly known as: DURICEF Take 1 capsule (500 mg total) by mouth 2 (two) times daily.   clopidogrel  75 MG tablet Commonly known as: PLAVIX  TAKE 1 TABLET BY MOUTH DAILY   ezetimibe  10 MG tablet Commonly known as: ZETIA  TAKE 1 TABLET BY MOUTH EVERY DAY   Farxiga  10 MG Tabs tablet Generic drug: dapagliflozin  propanediol TAKE 1 TABLET BY MOUTH DAILY BEFORE BREAKFAST   fenofibrate  145 MG tablet Commonly known as: TRICOR  Take 1 tablet (145 mg total) by mouth daily.   furosemide  40 MG tablet Commonly known as: LASIX  TAKE 1 TABLET BY MOUTH TWICE DAILY   gabapentin  300 MG capsule Commonly known as: NEURONTIN  TAKE ONE CAPSULE BY MOUTH FOUR TIMES DAILY   glimepiride  2 MG tablet Commonly known as: AMARYL  TAKE 1 TABLET BY MOUTH DAILY BEFORE BREAKFAST   insulin  regular 100 units/mL injection Commonly known as: HumuLIN R  INJECT 10 UNITS into THE SKIN THREE TIMES DAILY BEFORE meals. Discard AFTER 31 DAYS.   Insulin  Syringe-Needle U-100 31G X 5/16 0.3 ML Misc Commonly known as: Sure Comfort Insulin  Syringe USE WITH INSULIN  EVERY DAY   metoprolol  tartrate 25 MG tablet Commonly known as: LOPRESSOR  TAKE 1 TABLET BY MOUTH TWICE DAILY   OVER THE COUNTER MEDICATION Take 1 tablet by mouth daily. Beet   oxyCODONE -acetaminophen  5-325 MG tablet Commonly known as: Percocet Take 1 tablet by mouth every 6 (six) hours as needed for up to 5 days for severe pain (pain score 7-10).   Ozempic  (0.25 or 0.5 MG/DOSE) 2 MG/3ML  Sopn Generic drug: Semaglutide (0.25 or 0.5MG /DOS) Inject 0.5 mg into the skin once a week.   pantoprazole  40 MG tablet Commonly known as: PROTONIX  TAKE 1 TABLET BY MOUTH DAILY   Repatha  SureClick 140 MG/ML Soaj Generic drug: Evolocumab  Inject 140 mg into the skin every 14 (fourteen) days.   tamsulosin  0.4 MG Caps capsule Commonly known as: FLOMAX  TAKE ONE CAPSULE BY MOUTH DAILY   True Metrix Blood Glucose Test test strip Generic drug: glucose blood CHECK BLOOD SUGAR FOUR TIMES DAILY               Discharge Care Instructions  (From admission, onward)           Start     Ordered   08/26/24 0000  Discharge wound care:        08/26/24 1158  Follow ups:    Follow-up Information     Bacchus, Meade PEDLAR, FNP Follow up.   Specialty: Family Medicine Contact information: [address] [place] [address] [phone]                 Discharge Instructions:   Discharge Instructions     Call MD for:  difficulty breathing, headache or visual disturbances   Complete by: As directed    Call MD for:  extreme fatigue   Complete by: As directed    Call MD for:  hives   Complete by: As directed    Call MD for:  persistant dizziness or light-headedness   Complete by: As directed    Call MD for:  persistant nausea and vomiting   Complete by: As directed    Call MD for:  severe uncontrolled pain   Complete by: As directed    Call MD for:  temperature >100.4   Complete by: As directed    Diet general   Complete by: As directed    Discharge instructions   Complete by: As directed    Recommendations at discharge:   Complete course of antibiotics with oral cefadroxil  5 mg twice daily  Per podiatry, dressing clean and dry until f/u in office 1 week.  PDMP reviewed this encounter.   Opioid taper instructions: It is important to wean off of your opioid medication as soon as possible. If you do not need pain medication after your  surgery it is ok to stop day one. Opioids include: Codeine, Hydrocodone (Norco, Vicodin), Oxycodone (Percocet, oxycontin ) and hydromorphone  amongst others.  Long term and even short term use of opiods can cause: Increased pain response Dependence Constipation Depression Respiratory depression And more.  Withdrawal symptoms can include Flu like symptoms Nausea, vomiting And more Techniques to manage these symptoms Hydrate well Eat regular healthy meals Stay active Use relaxation techniques(deep breathing, meditating, yoga) Do Not substitute Alcohol to help with tapering If you have been on opioids for less than two weeks and do not have pain than it is ok to stop all together.  Plan to wean off of opioids This plan should start within one week post op of your joint replacement. Maintain the same interval or time between taking each dose and first decrease the dose.  Cut the total daily intake of opioids by one tablet each day Next start to increase the time between doses. The last dose that should be eliminated is the evening dose.        General discharge instructions: Follow with Primary MD Bacchus, Meade PEDLAR, FNP in 7 days  Please request your PCP  to go over your hospital tests, procedures, radiology results at the follow up. Please get your medicines reviewed and adjusted.  Your PCP may decide to repeat certain labs or tests as needed. Do not drive, operate heavy machinery, perform activities at heights, swimming or participation in water activities or provide baby sitting services if your were admitted for syncope or siezures until you have seen by Primary MD or a Neurologist and advised to do so again. [place]  Controlled Substance Reporting System database was reviewed. Do not drive, operate heavy machinery, perform activities at heights, swim, participate in water activities or provide baby-sitting services while on medications for pain, sleep and mood until your  outpatient physician has reevaluated you and advised to do so again.  You are strongly recommended to comply with the dose, frequency and duration of prescribed medications. Activity:  As tolerated with Full fall precautions use walker/cane & assistance as needed Avoid using any recreational substances like cigarette, tobacco, alcohol, or non-prescribed drug. If you experience worsening of your admission symptoms, develop shortness of breath, life threatening emergency, suicidal or homicidal thoughts you must seek medical attention immediately by calling 911 or calling your MD immediately  if symptoms less severe. You must read complete instructions/literature along with all the possible adverse reactions/side effects for all the medicines you take and that have been prescribed to you. Take any new medicine only after you have completely understood and accepted all the possible adverse reactions/side effects.  Wear Seat belts while driving. You were cared for by a hospitalist during your hospital stay. If you have any questions about your discharge medications or the care you received while you were in the hospital after you are discharged, you can call the unit and ask to speak with the hospitalist or the covering physician. Once you are discharged, your primary care physician will handle any further medical issues. Please note that NO REFILLS for any discharge medications will be authorized once you are discharged, as it is imperative that you return to your primary care physician (or establish a relationship with a primary care physician if you do not have one).   Discharge wound care:   Complete by: As directed    Increase activity slowly   Complete by: As directed        Discharge Exam:   Vitals:   08/26/24 0848 08/26/24 0900 08/26/24 0915 08/26/24 1034  BP: (!) 112/59 107/62 123/79 (!) 142/99  Pulse: 83 81  86  Resp: 18 17  17   Temp: 97.8 F (36.6 C)  97.6 F (36.4 C) 97.8 F (36.6 C)   TempSrc:    Oral  SpO2: 100% 94%  99%  Weight:      Height:        Body mass index is 30.92 kg/m.   General exam: Pleasant, elderly Caucasian male Skin: No rashes, lesions or ulcers. HEENT: Atraumatic, normocephalic, no obvious bleeding Lungs: Clear to auscultation bilaterally,  CVS: S1, S2, no murmur,   GI/Abd: Soft, nontender, nondistended, bowel sound present,   CNS: Alert, awake, oriented x 3 Psychiatry: Upset Extremities: Postop status of left foot   The results of significant diagnostics from this hospitalization (including imaging, microbiology, ancillary and laboratory) are listed below for reference.    Procedures and Diagnostic Studies:   No results found.   Labs:   Basic Metabolic Panel: Recent Labs  Lab 08/25/24 0719 08/26/24 0345  NA 137 137  K 3.9 3.7  CL 106 101  CO2 24 29  GLUCOSE 116* 151*  BUN 19 22  CREATININE 1.56* 1.65*  CALCIUM  8.7* 8.7*   GFR Estimated Creatinine Clearance: 45.1 mL/min (A) (by C-G formula based on SCr of 1.65 mg/dL (H)). Liver Function Tests: Recent Labs  Lab 08/25/24 0719  AST 30  ALT 30  ALKPHOS 56  BILITOT 0.8  PROT 6.1*  ALBUMIN  2.8*   No results for input(s): LIPASE, AMYLASE in the last 168 hours. No results for input(s): AMMONIA in the last 168 hours. Coagulation profile Recent Labs  Lab 08/25/24 0719  INR 1.1    CBC: Recent Labs  Lab 08/25/24 0719 08/26/24 0345  WBC 8.6 7.0  NEUTROABS  --  3.9  HGB 13.9 13.8  HCT 39.9 39.9  MCV 89.5 88.3  PLT 242 253   Cardiac Enzymes: No results for input(s): CKTOTAL, CKMB, CKMBINDEX,  TROPONINI in the last 168 hours. BNP: Invalid input(s): POCBNP CBG: Recent Labs  Lab 08/25/24 1641 08/25/24 2120 08/26/24 0610 08/26/24 0849 08/26/24 1120  GLUCAP 93 168* 134* 121* 193*   D-Dimer No results for input(s): DDIMER in the last 72 hours. Hgb A1c Recent Labs    08/25/24 0719  HGBA1C 8.6*   Lipid Profile No results for  input(s): CHOL, HDL, LDLCALC, TRIG, CHOLHDL, LDLDIRECT in the last 72 hours. Thyroid  function studies No results for input(s): TSH, T4TOTAL, T3FREE, THYROIDAB in the last 72 hours.  Invalid input(s): FREET3 Anemia work up No results for input(s): VITAMINB12, FOLATE, FERRITIN, TIBC, IRON, RETICCTPCT in the last 72 hours. Microbiology Recent Results (from the past 240 hours)  Surgical PCR screen     Status: Abnormal   Collection Time: 08/25/24  6:36 AM   Specimen: Nasal Mucosa; Nasal Swab  Result Value Ref Range Status   MRSA, PCR NEGATIVE NEGATIVE Final   Staphylococcus aureus POSITIVE (A) NEGATIVE Final    Comment: (NOTE) The Xpert SA Assay (FDA approved for NASAL specimens in patients 35 years of age and older), is one component of a comprehensive surveillance program. It is not intended to diagnose infection nor to guide or monitor treatment. Performed at [facility], [address] [place], [place] [address]     Time coordinating discharge: 45 minutes  Signed: Javanna Patin  [facility] 08/26/2024, 11:58 AM

## 2024-08-26 NOTE — Interval H&P Note (Signed)
 History and Physical Interval Note:  08/26/2024 7:41 AM  Todd Zhang  has presented today for surgery, with the diagnosis of OSTEOMYELTITIS LEFT FOURTH AND FIFTH  TOE.  The various methods of treatment have been discussed with the patient and family. After consideration of risks, benefits and other options for treatment, the patient has consented to  Procedures with comments: AMPUTATION, TOE (Left) - 4th and 5th TOE AMPUTATION as a surgical intervention.  The patient's history has been reviewed, patient examined, no change in status, stable for surgery.  I have reviewed the patient's chart and labs.  Questions were answered to the patient's satisfaction.     [name]

## 2024-08-26 NOTE — Anesthesia Preprocedure Evaluation (Addendum)
 Anesthesia Evaluation  Patient identified by MRN, date of birth, ID band Patient awake    Reviewed: Allergy & Precautions, H&P , NPO status , Patient's Chart, lab work & pertinent test results, reviewed documented beta blocker date and time   Airway Mallampati: II  TM Distance: >3 FB Neck ROM: Full    Dental no notable dental hx. (+) Edentulous Upper, Edentulous Lower, Dental Advisory Given   Pulmonary COPD, former smoker   Pulmonary exam normal breath sounds clear to auscultation       Cardiovascular hypertension, Pt. on medications and Pt. on home beta blockers + Peripheral Vascular Disease  + dysrhythmias Atrial Fibrillation  Rhythm:Regular Rate:Normal     Neuro/Psych Seizures -,   Anxiety      negative psych ROS   GI/Hepatic Neg liver ROS,GERD  Medicated,,  Endo/Other  diabetes, Type 2, Oral Hypoglycemic Agents, Insulin  Dependent    Renal/GU Renal InsufficiencyRenal disease  negative genitourinary   Musculoskeletal   Abdominal   Peds  Hematology  (+) Blood dyscrasia, anemia   Anesthesia Other Findings   Reproductive/Obstetrics negative OB ROS                              Anesthesia Physical Anesthesia Plan  ASA: 3  Anesthesia Plan: MAC   Post-op Pain Management: Ofirmev  IV (intra-op)*   Induction: Intravenous  PONV Risk Score and Plan: 2 and Ondansetron  and Propofol  infusion  Airway Management Planned: Natural Airway and Simple Face Mask  Additional Equipment:   Intra-op Plan:   Post-operative Plan:   Informed Consent: I have reviewed the patients History and Physical, chart, labs and discussed the procedure including the risks, benefits and alternatives for the proposed anesthesia with the patient or authorized representative who has indicated his/her understanding and acceptance.     Dental advisory given  Plan Discussed with: CRNA  Anesthesia Plan Comments:           Anesthesia Quick Evaluation

## 2024-08-26 NOTE — Progress Notes (Signed)
 Orthopedic Tech Progress Note Patient Details:  Todd Zhang 1945/10/03 [id number]  Crutches and PO shoe provided. Pt also states he already has a walker at home. Pt expresses understanding of wearing the PO when ambulating and is to be minimally WBAT.   Ortho Devices Type of Ortho Device: Postop shoe/boot, Crutches Ortho Device/Splint Location: Adjusted to RLE and placed at bedside as pt did not want it on at this moment. Crutches are also adjusted/at bedside. Ortho Device/Splint Interventions: Ordered, Application, Adjustment   Post Interventions Patient Tolerated: Fair Instructions Provided: Care of device, Adjustment of device, Poper ambulation with device  Tinnie Ronal Brasil 08/26/2024, 12:18 PM

## 2024-08-26 NOTE — Op Note (Signed)
 OPERATIVE REPORT Patient name: Todd Zhang MRN: [id number] DOB: 13-Nov-1945  DOS: 08/26/2024  Preop Dx: Osteomyelitis left foot Postop Dx: same  Procedure:  1.  Amputation fourth digit left foot 2.  Amputation fifth digit left foot  Surgeon: Thresa EMERSON Sar DPM  Anesthesia: 50-50 mixture of 2% lidocaine  plain with 0.5% Marcaine  plain totaling 10 mL infiltrated in the patient's left forefoot  Hemostasis: Calf tourniquet inflated to a pressure of after esmarch exsanguination   EBL: Minimal mL Materials: None Injectables: None Pathology: None  Condition: The patient tolerated the procedure and anesthesia well. No complications noted or reported   Justification for procedure: The patient presents for surgical correction of osteomyelitis to the fifth digit of the left foot.  Prior history of toe amputations to the digits 1-3 of the ipsilateral foot.  Planned procedure for amputation of both the 4th and 5th digit of the foot.  The patient was told benefits as well as possible side effects of the surgery. The patient consented for surgical correction. The patient consent form was reviewed. All patient questions were answered. No guarantees were expressed or implied. The patient and the surgeon both signed the patient consent form placed in the patient's chart.   Procedure in Detail: The patient was brought to the operating room in the supine position at which time an aseptic scrub and drape were performed about the patient's lower extremity after anesthesia was induced as described above. Attention was then directed to the surgical area where procedure commenced.  Procedure #1: Amputation fourth digit left foot A racquet type incision was planned and made about the base of the infected toe.  Incision was carried down to the level of the MTP joint which time a capsulotomy was performed to disarticulate the joint at the level of the MTP.  The toe was distracted distally and  disarticulated and removed in toto.  Any exposed tendons were distracted distally and cut as far proximal as could be visualized. Any necrotic tissue was excisionally debrided using a #15 scalpel down to healthier margins.    Procedure #2: Amputation fifth digit left foot An additional racquet type incision was planned and made about the base of the fifth digit of the left foot essentially creating a single open incision from the amputations of the digits.  Incision was carried down to the level of the MTP joint which time a capsulotomy was performed to disarticulate the joint at the level of the MTP.  The toe was distracted distally and disarticulated and removed in toto.  Any exposed tendons were distracted distally and cut as far proximal as could be visualized. Any necrotic tissue was excisionally debrided using a #15 scalpel down to healthier margins.    At this time irrigation was performed using bulb syringe and normal saline in preparation for primary closure.  4-0 Prolene suture was utilized to reapproximate skin edges for primary closure.  Dry sterile compressive dressings were then applied to all previously mentioned incision sites about the patient's lower extremity. The tourniquet which was used for hemostasis was deflated. All normal neurovascular responses including pink color and warmth returned all the digits of patient's lower extremity.  The patient was then transferred from the operating room to the recovery room having tolerated the procedure and anesthesia well. All vital signs are stable. After a brief stay in the recovery room the patient was readmitted to inpatient room with postoperative orders placed.    IMPRESSION: Anticipate clean margins.  Tissue appeared  healthy and viable.  Primary closure of the incision site achieved.  Minimal WBAT postop shoe w/ assistance of a walker. Rec dc on broad-spectrum ABX x 7 days. F/u in office 1 week post discharge.  Leave dressings clean dry  and intact.  Podiatry will sign off  Thresa EMERSON Sar, DPM [facility]  Dr. Thresa EMERSON Sar, DPM    [address] [place], [place] [address]                Office [phone]  Fax [phone]

## 2024-08-26 NOTE — Brief Op Note (Signed)
 08/26/2024  9:33 AM  PATIENT:  Todd Zhang  78 y.o. male  PRE-OPERATIVE DIAGNOSIS:  OSTEOMYELTITIS LEFT FOURTH AND FIFTH  TOE  POST-OPERATIVE DIAGNOSIS:  OSTEOMYELITITIS LEFT FOURTH AND FIFTH TOE  PROCEDURE:  Procedures with comments: AMPUTATION, TOE (Left) - 4th and 5th TOE AMPUTATION  SURGEON:  Surgeons and Role:    DEWAINE Janit Thresa CHRISTELLA, DPM - Primary  PHYSICIAN ASSISTANT: None  ASSISTANTS: none   ANESTHESIA:   local, MAC, and 10mL 50:50 mixture of 2% lidocaine  plain and 0.5% marcaine  plain  EBL:  5 mL   BLOOD ADMINISTERED:none  DRAINS: none   LOCAL MEDICATIONS USED:  MARCAINE    , LIDOCAINE  , and Amount: 10 ml  SPECIMEN:  No Specimen  DISPOSITION OF SPECIMEN:  N/A  COUNTS:  YES  TOURNIQUET:   Total Tourniquet Time Documented: Calf (Left) - 29 minutes Total: Calf (Left) - 29 minutes   DICTATION: .Nechama Dictation  PLAN OF CARE: Admit to inpatient   PATIENT DISPOSITION:  PACU - hemodynamically stable.   Delay start of Pharmacological VTE agent (>24hrs) due to surgical blood loss or risk of bleeding: not applicable  Thresa EMERSON Janit, DPM [facility]  Dr. Thresa EMERSON Janit, DPM    [address] [place], [place] [address]                Office [phone]  Fax [phone]

## 2024-08-27 ENCOUNTER — Telehealth: Payer: Self-pay

## 2024-08-27 ENCOUNTER — Encounter (HOSPITAL_COMMUNITY): Payer: Self-pay | Admitting: Podiatry

## 2024-08-27 NOTE — Transitions of Care (Post Inpatient/ED Visit) (Signed)
° °  08/27/2024  Name: CORNELIO PARKERSON MRN: [id number] DOB: [birth year]  Today's TOC FU Call Status: Today's TOC FU Call Status:: Unsuccessful Call (1st Attempt) Unsuccessful Call (1st Attempt) Date: 08/27/24  Attempted to reach the patient regarding the most recent Inpatient/ED visit.  Follow Up Plan: Additional outreach attempts will be made to reach the patient to complete the Transitions of Care (Post Inpatient/ED visit) call.   Kaithlyn Teagle J. Volanda Mangine RN, MSN [facility], [facility] RN Care Manager Direct Dial: [phone]  Fax: [phone] Website: [email or web address]

## 2024-08-28 ENCOUNTER — Telehealth: Payer: Self-pay

## 2024-08-28 NOTE — Discharge Summary (Signed)
 Physician Discharge Summary  Todd Zhang FMW:[id number] DOB: 1946-07-14 DOA: 08/24/2024  PCP: Edman Meade PEDLAR, FNP  Admit date: 08/24/2024 Discharge date: 08/26/2024  Admitted from: Home Discharge disposition: Home  Recommendations at discharge:  Complete course of antibiotics with oral cefadroxil  5 mg twice daily Per podiatry, dressing clean and dry until f/u in office 1 week.   Subjective: Patient was seen and examined this morning. Lying down in bed.  Underwent surgery today. Feels better and wants to go home. Seen by PT.  Patient refused to wear postop shoe as advised.  He ambulated fine with crutches. Blood from this morning with BUN/creatinine 22/1.65  Brief narrative: Todd Zhang is a 78 y.o. male with PMH significant for DM2, HTN, HLD, PAD s/p prior left 1st, 2nd & 3rd toe amputation 2024, A-fib, cervical and lumbar degenerative disc disease, cervical spine stenosis,  12/12: Patient presented to the ED at [facility] with a progressively worsening left toe wound, purulent drainage. Labs showed WBC 10.2, hemoglobin 14.7, platelets 237, creatinine 2.2.  X-ray of left foot showed mild erosive changes of the lateral fifth digit concerning for osteomyelitis.  Ultrasound lower extremities was negative for DVT. Patient was started on vancomycin  and cefepime  Transferred to [facility] for further management.   Hospital course: Acute osteomyelitis of left 4th toe Presented with left foot wound with reported purulent drainage X-ray obtained at outside hospital showed possible bony erosion. Empirically started on IV cefepime  and IV vancomycin  Seen by podiatry Podiatry recommended a transmetatarsal amputation however patient refused.  He only agreed to amputation of 4th and 5th toe. 12/14, underwent left 4th and 5th toe amputation Postoperatively doing well.  Seen by PT. Patient refused to wear postop shoe as advised.  He ambulated fine with crutches.  Per podiatry  margins appeared clean. Okay to be discharged today on 7 days of oral antibiotics Dressing clean and dry until f/u in office 1 week.  CKD 3A Creatinine at baseline  Recent Labs    08/25/24 0719 08/26/24 0345  BUN 19 22  CREATININE 1.56* 1.65*  CO2 24 29   Type 2 diabetes mellitus A1c 7.7 on 06/14/2023 PTA meds-Lantus , aspart SSI, Farxiga  10 mg daily, glimepiride  2 mg daily Continue same regimen as before Recent Labs  Lab 08/25/24 1641 08/25/24 2120 08/26/24 0610 08/26/24 0849 08/26/24 1120  GLUCAP 93 168* 134* 121* 193*   CHF Hypertension PTA meds- metoprolol  tartrate 25 mg twice daily, Lasix  40 mg twice daily, Farxiga  10 mg daily Continue same regimen  CAD PAD PTA meds- Plavix  75 mg daily Was held for surgery.  Per podiatry, okay to resume Plavix  from tomorrow Lipid management as below  HLD PTA meds- Lipitor  80 mg daily, Zetia  10 mg daily, fenofibrate  145 mg daily, Repatha  Continue all  Chronic pain Degenerative disc disease Cervical/lumbar stenosis PTA meds- gabapentin  300 mg 4 times daily Continue  BPH Flomax  0.4 mg daily  GERD Protonix   Goals of care   Code Status: Prior   Diet:  Diet Order             Diet general                   Nutritional status:  Body mass index is 30.92 kg/m.       Wounds:  - Wound 08/26/24 9178 Surgical Closed Surgical Incision Foot Left (Active)  Date First Assessed/Time First Assessed: 08/26/24 0821   Primary Wound Type: Surgical  Secondary Wound Type - Surgical: Closed  Surgical Incision  Location: Foot  Location Orientation: Left    Assessments 08/26/2024  8:48 AM 08/26/2024  9:55 AM  Site / Wound Assessment Dressing in place / Unable to assess Dressing in place / Unable to assess  Drainage Amount None --  Dressing Type Compression wrap Compression wrap  Dressing Changed New --  Dressing Status Clean, Dry, Intact Clean, Dry, Intact     No associated orders.    Discharge Medications:    Allergies as of 08/26/2024       Reactions   Dorethia Bent ] Hives   Penicillins Hives   Hives + vomiting as an adult  - tolerated Ceftriaxone  04/2021   Tetanus Toxoid Other (See Comments)   unknown        Medication List     TAKE these medications    atorvastatin  80 MG tablet Commonly known as: LIPITOR  TAKE 1 TABLET BY MOUTH DAILY   cefadroxil  500 MG capsule Commonly known as: DURICEF Take 1 capsule (500 mg total) by mouth 2 (two) times daily.   clopidogrel  75 MG tablet Commonly known as: PLAVIX  TAKE 1 TABLET BY MOUTH DAILY   ezetimibe  10 MG tablet Commonly known as: ZETIA  TAKE 1 TABLET BY MOUTH EVERY DAY   Farxiga  10 MG Tabs tablet Generic drug: dapagliflozin  propanediol TAKE 1 TABLET BY MOUTH DAILY BEFORE BREAKFAST   fenofibrate  145 MG tablet Commonly known as: TRICOR  Take 1 tablet (145 mg total) by mouth daily.   furosemide  40 MG tablet Commonly known as: LASIX  TAKE 1 TABLET BY MOUTH TWICE DAILY   gabapentin  300 MG capsule Commonly known as: NEURONTIN  TAKE ONE CAPSULE BY MOUTH FOUR TIMES DAILY   glimepiride  2 MG tablet Commonly known as: AMARYL  TAKE 1 TABLET BY MOUTH DAILY BEFORE BREAKFAST   insulin  regular 100 units/mL injection Commonly known as: HumuLIN R  INJECT 10 UNITS into THE SKIN THREE TIMES DAILY BEFORE meals. Discard AFTER 31 DAYS.   Insulin  Syringe-Needle U-100 31G X 5/16 0.3 ML Misc Commonly known as: Sure Pharmacologist USE WITH INSULIN  EVERY DAY   metoprolol  tartrate 25 MG tablet Commonly known as: LOPRESSOR  TAKE 1 TABLET BY MOUTH TWICE DAILY   OVER THE COUNTER MEDICATION Take 1 tablet by mouth daily. Beet   oxyCODONE -acetaminophen  5-325 MG tablet Commonly known as: Percocet Take 1 tablet by mouth every 6 (six) hours as needed for up to 5 days for severe pain (pain score 7-10).   Ozempic  (0.25 or 0.5 MG/DOSE) 2 MG/3ML Sopn Generic drug: Semaglutide (0.25 or 0.5MG /DOS) Inject 0.5 mg into the skin once a week.    pantoprazole  40 MG tablet Commonly known as: PROTONIX  TAKE 1 TABLET BY MOUTH DAILY   Repatha  SureClick 140 MG/ML Soaj Generic drug: Evolocumab  Inject 140 mg into the skin every 14 (fourteen) days.   tamsulosin  0.4 MG Caps capsule Commonly known as: FLOMAX  TAKE ONE CAPSULE BY MOUTH DAILY   True Metrix Blood Glucose Test test strip Generic drug: glucose blood CHECK BLOOD SUGAR FOUR TIMES DAILY               Discharge Care Instructions  (From admission, onward)           Start     Ordered   08/26/24 0000  Discharge wound care:        08/26/24 1158             Follow ups:    Follow-up Information     Bacchus, Meade PEDLAR, FNP Follow up.   Specialty: Family  Medicine Contact information: [address] [place] [address] [phone]                 Discharge Instructions:   Discharge Instructions     Call MD for:  difficulty breathing, headache or visual disturbances   Complete by: As directed    Call MD for:  extreme fatigue   Complete by: As directed    Call MD for:  hives   Complete by: As directed    Call MD for:  persistant dizziness or light-headedness   Complete by: As directed    Call MD for:  persistant nausea and vomiting   Complete by: As directed    Call MD for:  severe uncontrolled pain   Complete by: As directed    Call MD for:  temperature >100.4   Complete by: As directed    Diet general   Complete by: As directed    Discharge instructions   Complete by: As directed    Recommendations at discharge:   Complete course of antibiotics with oral cefadroxil  5 mg twice daily  Per podiatry, dressing clean and dry until f/u in office 1 week.  PDMP reviewed this encounter.   Opioid taper instructions: It is important to wean off of your opioid medication as soon as possible. If you do not need pain medication after your surgery it is ok to stop day one. Opioids include: Codeine, Hydrocodone (Norco, Vicodin),  Oxycodone (Percocet, oxycontin ) and hydromorphone  amongst others.  Long term and even short term use of opiods can cause: Increased pain response Dependence Constipation Depression Respiratory depression And more.  Withdrawal symptoms can include Flu like symptoms Nausea, vomiting And more Techniques to manage these symptoms Hydrate well Eat regular healthy meals Stay active Use relaxation techniques(deep breathing, meditating, yoga) Do Not substitute Alcohol to help with tapering If you have been on opioids for less than two weeks and do not have pain than it is ok to stop all together.  Plan to wean off of opioids This plan should start within one week post op of your joint replacement. Maintain the same interval or time between taking each dose and first decrease the dose.  Cut the total daily intake of opioids by one tablet each day Next start to increase the time between doses. The last dose that should be eliminated is the evening dose.        General discharge instructions: Follow with Primary MD Bacchus, Meade PEDLAR, FNP in 7 days  Please request your PCP  to go over your hospital tests, procedures, radiology results at the follow up. Please get your medicines reviewed and adjusted.  Your PCP may decide to repeat certain labs or tests as needed. Do not drive, operate heavy machinery, perform activities at heights, swimming or participation in water activities or provide baby sitting services if your were admitted for syncope or siezures until you have seen by Primary MD or a Neurologist and advised to do so again. [place]  Controlled Substance Reporting System database was reviewed. Do not drive, operate heavy machinery, perform activities at heights, swim, participate in water activities or provide baby-sitting services while on medications for pain, sleep and mood until your outpatient physician has reevaluated you and advised to do so again.  You are strongly  recommended to comply with the dose, frequency and duration of prescribed medications. Activity: As tolerated with Full fall precautions use walker/cane & assistance as needed Avoid using any recreational substances like cigarette, tobacco, alcohol,  or non-prescribed drug. If you experience worsening of your admission symptoms, develop shortness of breath, life threatening emergency, suicidal or homicidal thoughts you must seek medical attention immediately by calling 911 or calling your MD immediately  if symptoms less severe. You must read complete instructions/literature along with all the possible adverse reactions/side effects for all the medicines you take and that have been prescribed to you. Take any new medicine only after you have completely understood and accepted all the possible adverse reactions/side effects.  Wear Seat belts while driving. You were cared for by a hospitalist during your hospital stay. If you have any questions about your discharge medications or the care you received while you were in the hospital after you are discharged, you can call the unit and ask to speak with the hospitalist or the covering physician. Once you are discharged, your primary care physician will handle any further medical issues. Please note that NO REFILLS for any discharge medications will be authorized once you are discharged, as it is imperative that you return to your primary care physician (or establish a relationship with a primary care physician if you do not have one).   Discharge wound care:   Complete by: As directed    Increase activity slowly   Complete by: As directed        Discharge Exam:   Vitals:   08/26/24 0848 08/26/24 0900 08/26/24 0915 08/26/24 1034  BP: (!) 112/59 107/62 123/79 (!) 142/99  Pulse: 83 81  86  Resp: 18 17  17   Temp: 97.8 F (36.6 C)  97.6 F (36.4 C) 97.8 F (36.6 C)  TempSrc:    Oral  SpO2: 100% 94%  99%  Weight:      Height:        Body mass  index is 30.92 kg/m.   General exam: Pleasant, elderly Caucasian male Skin: No rashes, lesions or ulcers. HEENT: Atraumatic, normocephalic, no obvious bleeding Lungs: Clear to auscultation bilaterally,  CVS: S1, S2, no murmur,   GI/Abd: Soft, nontender, nondistended, bowel sound present,   CNS: Alert, awake, oriented x 3 Psychiatry: Upset Extremities: Postop status of left foot   The results of significant diagnostics from this hospitalization (including imaging, microbiology, ancillary and laboratory) are listed below for reference.    Procedures and Diagnostic Studies:   No results found.   Labs:   Basic Metabolic Panel: Recent Labs  Lab 08/25/24 0719 08/26/24 0345  NA 137 137  K 3.9 3.7  CL 106 101  CO2 24 29  GLUCOSE 116* 151*  BUN 19 22  CREATININE 1.56* 1.65*  CALCIUM  8.7* 8.7*   GFR Estimated Creatinine Clearance: 45.1 mL/min (A) (by C-G formula based on SCr of 1.65 mg/dL (H)). Liver Function Tests: Recent Labs  Lab 08/25/24 0719  AST 30  ALT 30  ALKPHOS 56  BILITOT 0.8  PROT 6.1*  ALBUMIN  2.8*   No results for input(s): LIPASE, AMYLASE in the last 168 hours. No results for input(s): AMMONIA in the last 168 hours. Coagulation profile Recent Labs  Lab 08/25/24 0719  INR 1.1    CBC: Recent Labs  Lab 08/25/24 0719 08/26/24 0345  WBC 8.6 7.0  NEUTROABS  --  3.9  HGB 13.9 13.8  HCT 39.9 39.9  MCV 89.5 88.3  PLT 242 253   Cardiac Enzymes: No results for input(s): CKTOTAL, CKMB, CKMBINDEX, TROPONINI in the last 168 hours. BNP: Invalid input(s): POCBNP CBG: Recent Labs  Lab 08/25/24 1641 08/25/24 2120 08/26/24 9389  08/26/24 0849 08/26/24 1120  GLUCAP 93 168* 134* 121* 193*   D-Dimer No results for input(s): DDIMER in the last 72 hours. Hgb A1c No results for input(s): HGBA1C in the last 72 hours.  Lipid Profile No results for input(s): CHOL, HDL, LDLCALC, TRIG, CHOLHDL, LDLDIRECT in the last 72  hours. Thyroid  function studies No results for input(s): TSH, T4TOTAL, T3FREE, THYROIDAB in the last 72 hours.  Invalid input(s): FREET3 Anemia work up No results for input(s): VITAMINB12, FOLATE, FERRITIN, TIBC, IRON, RETICCTPCT in the last 72 hours. Microbiology Recent Results (from the past 240 hours)  Surgical PCR screen     Status: Abnormal   Collection Time: 08/25/24  6:36 AM   Specimen: Nasal Mucosa; Nasal Swab  Result Value Ref Range Status   MRSA, PCR NEGATIVE NEGATIVE Final   Staphylococcus aureus POSITIVE (A) NEGATIVE Final    Comment: (NOTE) The Xpert SA Assay (FDA approved for NASAL specimens in patients 40 years of age and older), is one component of a comprehensive surveillance program. It is not intended to diagnose infection nor to guide or monitor treatment. Performed at [facility], [address], [place], [place] [address]     Time coordinating discharge: 45 minutes  Signed: Anuj Summons  [facility] 08/28/2024, 7:50 AM

## 2024-08-28 NOTE — Transitions of Care (Post Inpatient/ED Visit) (Signed)
° °  08/28/2024  Name: Todd Zhang MRN: [id number] DOB: [birth year]  Today's TOC FU Call Status: Today's TOC FU Call Status:: Successful TOC FU Call Completed TOC FU Call Complete Date: 08/28/24  Patient's Name and Date of Birth confirmed. Name, DOB  Transition Care Management Follow-up Telephone Call How have you been since you were released from the hospital?: Same Any questions or concerns?: No  Items Reviewed: Did you receive and understand the discharge instructions provided?: Yes Medications obtained,verified, and reconciled?: Yes (Medications Reviewed) (states  that he has his medications and is taking as prescribed.)  Medications Reviewed Today: verbally states that he has his medications but refused to review. Medications Reviewed Today   Medications were not reviewed in this encounter     Placed call to patient and explained the reason for call.  Offered to review medications, instructions and follow up.   Patient was very angry and states that he can not read. I offered to read the instructions to him and help and he said no.   I did remind patient that he needed to see his MD in 7 days.  He refused to allow me to assist him. Call ended.   Alan Ee, RN, BSN, CEN [employer]- Transition of Care Team.  [facility] [phone]

## 2024-08-29 ENCOUNTER — Encounter: Payer: Self-pay | Admitting: Nurse Practitioner

## 2024-08-29 ENCOUNTER — Ambulatory Visit (INDEPENDENT_AMBULATORY_CARE_PROVIDER_SITE_OTHER): Admitting: Nurse Practitioner

## 2024-08-29 VITALS — BP 138/80 | HR 99 | Ht 69.0 in | Wt 224.0 lb

## 2024-08-29 DIAGNOSIS — Z89422 Acquired absence of other left toe(s): Secondary | ICD-10-CM

## 2024-08-29 DIAGNOSIS — Z09 Encounter for follow-up examination after completed treatment for conditions other than malignant neoplasm: Secondary | ICD-10-CM | POA: Diagnosis not present

## 2024-08-29 NOTE — Progress Notes (Signed)
 Subjective:    Patient ID: Todd Zhang, male    DOB: Sep 22, 1945, 78 y.o.   MRN: [id number]   Chief Complaint: Hospitalization Follow-up   HPI  Suppose to have been transition of care- but [roper documentation is not in computer.  Patient went to the ED on 08/23/24 and was dx with osteomylitis. He was schecduled to have surgery on 08/24/24. Had left toe amputation. Here is here today for follow up. He is doing well. Taking BID duricef . Has only had to take 5 of his pain meds. Suppose  to follow up Friday with podiatry to have dressing change.    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  Eyes:  Negative for pain.  Respiratory:  Negative for shortness of breath.   Cardiovascular:  Negative for chest pain, palpitations and leg swelling.  Gastrointestinal:  Negative for abdominal pain.  Endocrine:  Negative for polydipsia.  Skin:  Negative for rash.  Neurological:  Negative for dizziness, weakness and headaches.  Hematological:  Does not bruise/bleed easily.  All other systems reviewed and are negative.      Objective:   Physical Exam Constitutional:      Appearance: Normal appearance. He is obese.  Cardiovascular:     Rate and Rhythm: Normal rate and regular rhythm.     Heart sounds: Normal heart sounds.  Pulmonary:     Breath sounds: Normal breath sounds.  Skin:    General: Skin is warm.     Comments: Left foot dressing is clean dry and intact. No excudate noted  Neurological:     General: No focal deficit present.     Mental Status: He is alert and oriented to person, place, and time.  Psychiatric:        Mood and Affect: Mood normal.        Behavior: Behavior normal.    BP 138/80   Pulse 99   Ht 5' 9 (1.753 m)   Wt 224 lb (101.6 kg)   SpO2 98%   BMI 33.08 kg/m         Assessment & Plan:   Todd Zhang in today with chief complaint of Hospitalization Follow-up   1. Status post amputation of lesser toe of left foot (Primary) Leave dressing intact Follow up with podiatry Friday   2. Hospital discharge follow-up Hospital records reviewed    The above assessment and management plan was discussed with the patient. The patient  verbalized understanding of and has agreed to the management plan. Patient is aware to call the clinic if symptoms persist or worsen. Patient is aware when to return to the clinic for a follow-up visit. Patient educated on when it is appropriate to go to the emergency department.   [name] Gladis, FNP

## 2024-08-30 ENCOUNTER — Telehealth: Payer: Self-pay | Admitting: Podiatry

## 2024-08-30 NOTE — Telephone Encounter (Signed)
 Patients provider office assisted patient with calling office to schedule a post op.Per patient past appointment history he has been seen in [place]. I offered an appointment to patient and they stated they did not have transportation to either [place] or [place]. Can you advise on how patient should handle post-ops? Thanks

## 2024-09-03 ENCOUNTER — Other Ambulatory Visit: Payer: Self-pay | Admitting: Family Medicine

## 2024-09-03 DIAGNOSIS — I10 Essential (primary) hypertension: Secondary | ICD-10-CM

## 2024-09-03 DIAGNOSIS — E7849 Other hyperlipidemia: Secondary | ICD-10-CM

## 2024-09-04 ENCOUNTER — Ambulatory Visit: Payer: Self-pay

## 2024-09-04 ENCOUNTER — Other Ambulatory Visit: Payer: Self-pay

## 2024-09-04 ENCOUNTER — Encounter (HOSPITAL_COMMUNITY): Payer: Self-pay

## 2024-09-04 ENCOUNTER — Emergency Department (HOSPITAL_COMMUNITY)
Admission: EM | Admit: 2024-09-04 | Discharge: 2024-09-04 | Disposition: A | Discharge status: Home / Self Care | Attending: Emergency Medicine | Admitting: Emergency Medicine

## 2024-09-04 DIAGNOSIS — Z7902 Long term (current) use of antithrombotics/antiplatelets: Secondary | ICD-10-CM | POA: Diagnosis not present

## 2024-09-04 DIAGNOSIS — R21 Rash and other nonspecific skin eruption: Secondary | ICD-10-CM | POA: Diagnosis present

## 2024-09-04 DIAGNOSIS — L233 Allergic contact dermatitis due to drugs in contact with skin: Secondary | ICD-10-CM | POA: Diagnosis not present

## 2024-09-04 DIAGNOSIS — T3695XA Adverse effect of unspecified systemic antibiotic, initial encounter: Secondary | ICD-10-CM | POA: Insufficient documentation

## 2024-09-04 DIAGNOSIS — L270 Generalized skin eruption due to drugs and medicaments taken internally: Secondary | ICD-10-CM

## 2024-09-04 LAB — CBG MONITORING, ED: Glucose-Capillary: 174 mg/dL — ABNORMAL HIGH (ref 70–99)

## 2024-09-04 MED ORDER — DEXAMETHASONE SOD PHOSPHATE PF 10 MG/ML IJ SOLN
5.0000 mg | Freq: Once | INTRAMUSCULAR | Status: AC
  Administered 2024-09-04: 5 mg via INTRAMUSCULAR

## 2024-09-04 MED ORDER — HYDROXYZINE HCL 10 MG PO TABS: 10.0000 mg | ORAL_TABLET | Freq: Four times a day (QID) | ORAL | 0 refills | Status: DC | PRN

## 2024-09-04 NOTE — ED Provider Notes (Signed)
 " [place] EMERGENCY DEPARTMENT AT [facility] Provider Note   CSN: [id number] Arrival date & time: 09/04/24  1505     Patient presents with: Rash   Todd Zhang is a 78 y.o. male who presents emergency department with a chief complaint of itching rash.  Patient recently admitted for osteomyelitis and he underwent a transmetatarsal proximal amputation of the left foot on 08/26/2024.  He reports that he developed itchy uncomfortable rash while he was admitted in the hospital.  He has finished all of his antibiotics but continues to have an uncomfortable rash.  He has not been have any fevers chills or other symptoms.  His PCP is currently out of the office and he was not able to follow-up with her so came to the emergency department.  He has no other complaints at this time    Rash      Prior to Admission medications  Medication Sig Start Date End Date Taking? Authorizing Provider  hydrOXYzine  (ATARAX ) 10 MG tablet Take 1 tablet (10 mg total) by mouth every 6 (six) hours as needed for itching. 09/04/24  Yes Tristine Langi, PA-C  atorvastatin  (LIPITOR ) 80 MG tablet TAKE 1 TABLET BY MOUTH DAILY 12/08/23   Bacchus, Gloria Z, FNP  cefadroxil  (DURICEF) 500 MG capsule Take 1 capsule (500 mg total) by mouth 2 (two) times daily. 08/26/24   Arlice Reichert, MD  clopidogrel  (PLAVIX ) 75 MG tablet TAKE 1 TABLET BY MOUTH DAILY 07/09/24   Mallipeddi, Vishnu P, MD  Evolocumab  (REPATHA  SURECLICK) 140 MG/ML SOAJ Inject 140 mg into the skin every 14 (fourteen) days. Patient not taking: Reported on 01/30/2024 07/18/23   Bacchus, Gloria Z, FNP  ezetimibe  (ZETIA ) 10 MG tablet TAKE 1 TABLET BY MOUTH EVERY DAY 09/03/24   Bacchus, Gloria Z, FNP  FARXIGA  10 MG TABS tablet TAKE 1 TABLET BY MOUTH DAILY BEFORE BREAKFAST 06/08/24   Bacchus, Gloria Z, FNP  fenofibrate  (TRICOR ) 145 MG tablet Take 1 tablet (145 mg total) by mouth daily. 07/18/24   Mallipeddi, Vishnu P, MD  furosemide  (LASIX ) 40 MG tablet TAKE 1  TABLET BY MOUTH TWICE DAILY 09/03/24   Bacchus, Gloria Z, FNP  gabapentin  (NEURONTIN ) 300 MG capsule TAKE ONE CAPSULE BY MOUTH FOUR TIMES DAILY 08/01/24   Bacchus, Gloria Z, FNP  glimepiride  (AMARYL ) 2 MG tablet TAKE 1 TABLET BY MOUTH DAILY BEFORE BREAKFAST 06/08/24   Bacchus, Gloria Z, FNP  glucose blood (TRUE METRIX BLOOD GLUCOSE TEST) test strip CHECK BLOOD SUGAR FOUR TIMES DAILY 11/21/23   Bacchus, Gloria Z, FNP  insulin  regular (HUMULIN R ) 100 units/mL injection INJECT 10 UNITS into THE SKIN THREE TIMES DAILY BEFORE meals. Discard AFTER 31 DAYS. 02/16/24   Bacchus, Meade PEDLAR, FNP  Insulin  Syringe-Needle U-100 (SURE COMFORT INSULIN  SYRINGE) 31G X 5/16 0.3 ML MISC USE WITH INSULIN  EVERY DAY 03/21/24   Bacchus, Gloria Z, FNP  metoprolol  tartrate (LOPRESSOR ) 25 MG tablet TAKE 1 TABLET BY MOUTH TWICE DAILY 03/22/24   Mallipeddi, Vishnu P, MD  OVER THE COUNTER MEDICATION Take 1 tablet by mouth daily. Beet    [provider]  pantoprazole  (PROTONIX ) 40 MG tablet TAKE 1 TABLET BY MOUTH DAILY 05/09/24   Bacchus, Gloria Z, FNP  Semaglutide ,0.25 or 0.5MG /DOS, (OZEMPIC , 0.25 OR 0.5 MG/DOSE,) 2 MG/3ML SOPN Inject 0.5 mg into the skin once a week. Patient not taking: Reported on 01/30/2024 07/18/23   Bacchus, Gloria Z, FNP  tamsulosin  (FLOMAX ) 0.4 MG CAPS capsule TAKE ONE CAPSULE BY MOUTH DAILY 02/16/24  Bacchus, Meade PEDLAR, FNP    Allergies: Asa [aspirin ], Penicillins, and Tetanus toxoid    Review of Systems  Skin:  Positive for rash.    Updated Vital Signs BP 126/66 (BP Location: Right Arm)   Pulse (!) 109   Temp 98 F (36.7 C) (Oral)   Resp 18   Ht 5' 9 (1.753 m)   Wt 101.6 kg   SpO2 93%   BMI 33.08 kg/m   Physical Exam Vitals and nursing note reviewed.  Constitutional:      General: He is not in acute distress.    Appearance: He is well-developed. He is not diaphoretic.  HENT:     Head: Normocephalic and atraumatic.  Eyes:     General: No scleral icterus.    Conjunctiva/sclera:  Conjunctivae normal.  Cardiovascular:     Rate and Rhythm: Normal rate and regular rhythm.     Heart sounds: Normal heart sounds.  Pulmonary:     Effort: Pulmonary effort is normal. No respiratory distress.     Breath sounds: Normal breath sounds.  Abdominal:     Palpations: Abdomen is soft.     Tenderness: There is no abdominal tenderness.  Musculoskeletal:     Cervical back: Normal range of motion and neck supple.     Comments: Status post transmetatarsal amputation left foot.  The wound is excellent in appearance with good with wound healing, no significant swelling or discharge.  Sutures are in place  Skin:    General: Skin is warm and dry.     Comments: Bilateral lower extremities with small areas of erythema corrugation, no petechial hemorrhages.  Similar patches of erythema with excoriation on the abdomen.  No facial involvement, no wheals, no lymphangitis.  Neurological:     Mental Status: He is alert.  Psychiatric:        Behavior: Behavior normal.     (all labs ordered are listed, but only abnormal results are displayed) Labs Reviewed  CBG MONITORING, ED - Abnormal; Notable for the following components:      Result Value   Glucose-Capillary 174 (*)    All other components within normal limits    EKG: None  Radiology: No results found.   Procedures   Medications Ordered in the ED  dexamethasone  (DECADRON ) injection 5 mg (has no administration in time range)                                    Medical Decision Making Risk Prescription drug management.   Patient here with what appears to be a drug eruption.  His exam appears benign he does not have any evidence of fever or chills or other systemic symptoms, no peeling or desquamation suggestive of SJS.  Since his symptoms are not concerning for dress or TENS.  Patient also does not have secondary symptoms concerning for potential cellulitis from his recent infection.  He was seen in shared visit with Dr.  Elnor.  He agrees with my assessment.  I ordered a CBG on the patient at 174 get him to a low dose of IM Decadron  he is warned that his sugars may elevate this will likely help reduce his symptoms.  Also will be discharged with low-dose Vistaril  for itching and discomfort.  He may follow closely with Dr. Janit for reevaluation of his foot which looks like it is healing excellently. Discussed return precuations    Final diagnoses:  Drug rash    ED Discharge Orders          Ordered    hydrOXYzine  (ATARAX ) 10 MG tablet  Every 6 hours PRN        09/04/24 1740               Arloa Chroman, PA-C 09/04/24 1747    Elnor Jayson LABOR, DO 09/10/24 1548  "

## 2024-09-04 NOTE — ED Triage Notes (Signed)
 Pt arrived via POV c/o worsening redness and rash on both of his legs that began after undergoing antibiotic treatment following a toe amputation. Pt reports he has not changed his dressing since discharge and his rash has been itching. Pt reports trying multiple remedies at home w/o relief.

## 2024-09-04 NOTE — Discharge Instructions (Signed)
 Appears that you are having an allergic reaction to likely the antibiotics that you took while you are in the hospital. You were treated with a single dose of Decadron  here in the emergency department which should reduce some of your allergic response but may cause your sugars to be elevated Please adjust your insulin  needs as needed.  Make sure to monitor your blood sugars. Also discharging you with some medication called hydroxyzine .  This can help with itching but make you sleepy.  Only take when you are very uncomfortable. Otherwise please follow very closely with Dr. Thresa Sar for your recent transmetatarsal amputation.

## 2024-09-04 NOTE — Telephone Encounter (Signed)
 FYI Only or Action Required?: FYI only for provider: tried to schedule office appt at [facility], states he doesn't know where that is, RN gave address, he states he'd rather just go to [facility]. Pt states he will go there sometime today.  Patient was last seen in primary care on 08/29/2024 by Gladis Mustard, FNP.  Called Nurse Triage reporting Rash.  Symptoms began several weeks ago.  Interventions attempted: Other: lotions.  Symptoms are: gradually worsening.  Triage Disposition: See Physician Within 24 Hours  Patient/caregiver understands and will follow disposition?: No, refuses disposition     Copied from CRM #[id number]. Topic: Clinical - Red Word Triage >> Sep 04, 2024  9:42 AM Tiffini S wrote: Red Word that prompted transfer to Nurse Triage: Patient is having severe pain from  Left toe AMPUTATION on 08/24/24- patient have taken all of the antibiotics and itching all over the body/ spreading- do not know what is causing the worsening itch- used alcohol and hand sanitizer and peroxide Reason for Disposition  Hives or itching  Answer Assessment - Initial Assessment Questions Pt had toe amputation on 12.12.25 has not had follow up with podiatry since then. He states he can't get to where he is so isn't sure what he's supposed to do. So he hasn't removed or changed the dressing since then. He states while still in the hospital he began to get a rash which he describes are redness, on his legs. Since he has gotten home he states it is spreading, waist, back, arms. He has tried lotion and it isn't helping. RN attempted to schedule appt at [facility] but pt states he doesn't know where that is and can he just go to [facility]. Rn advised yes he can go to the ER to be evaluated. He asked if they were going to keep him in the hospital RN advised unsure, that will be up to the doctors and he said oh man, I never should have called. Rn apologized but advised he needs to be seen since he finished  atbx on Sunday and rash is worsening and no one has looked at the surgical site.     1. APPEARANCE of RASH: What does the rash look like? (e.g., spots, blisters, raised areas, skin peeling, scaly)     redness 2. SIZE: How big are the spots? (e.g., tip of pen, eraser, coin; inches, centimeters)     generalized 3. LOCATION: Where is the rash located?     generalized 4. COLOR: What color is the rash? (Note: It is difficult to assess rash color in people with darker-colored skin. When this situation occurs, simply ask the caller to describe what they see.)     red 5. ONSET: When did the rash begin?     He states while still in the hospital but unsure of a date 6. FEVER: Do you have a fever? If Yes, ask: What is your temperature, how was it measured, and when did it start?     denies 7. ITCHING: Does the rash itch? If Yes, ask: How bad is the itch? (Scale 1-10; or mild, moderate, severe)     Yes, moderate to severe 8. CAUSE: What do you think is causing the rash?     Unsure but mentioned atbx 9. NEW MEDICINES: What new medicines are you taking? (e.g., name of antibiotic) When did you start taking this medication?.     Pt was on duricef for 7 days, completed 12.21.25 10. OTHER SYMPTOMS: Do you have any  other symptoms? (e.g., sore throat, fever, joint pain)       denies  Protocols used: Rash - Widespread On Drugs-A-AH

## 2024-09-04 NOTE — Telephone Encounter (Signed)
 noted

## 2024-09-12 ENCOUNTER — Inpatient Hospital Stay: Payer: Self-pay | Admitting: Family Medicine

## 2024-09-19 ENCOUNTER — Ambulatory Visit: Payer: Self-pay

## 2024-09-19 VITALS — BP 129/67 | HR 90 | Ht 69.0 in | Wt 224.1 lb

## 2024-09-19 DIAGNOSIS — Z89422 Acquired absence of other left toe(s): Secondary | ICD-10-CM | POA: Diagnosis not present

## 2024-09-19 DIAGNOSIS — Z4802 Encounter for removal of sutures: Secondary | ICD-10-CM | POA: Diagnosis not present

## 2024-09-19 DIAGNOSIS — M869 Osteomyelitis, unspecified: Secondary | ICD-10-CM

## 2024-09-19 DIAGNOSIS — T50905D Adverse effect of unspecified drugs, medicaments and biological substances, subsequent encounter: Secondary | ICD-10-CM | POA: Diagnosis not present

## 2024-09-19 MED ORDER — HYDROXYZINE HCL 10 MG PO TABS: 10.0000 mg | ORAL_TABLET | Freq: Three times a day (TID) | ORAL | 2 refills | Status: AC | PRN

## 2024-09-19 NOTE — Progress Notes (Signed)
 "  Established Patient Office Visit  Subjective   Patient ID: Todd Zhang, male    DOB: 1946-03-09  Age: 79 y.o. MRN: [id number]  Chief Complaint  Patient presents with   Medical Management of Chronic Issues    Pt here for a follow up     HPI Discussed the use of AI scribe software for clinical note transcription with the patient, who gave verbal consent to proceed.  History of Present Illness    Todd Zhang is a 79 year old male who presents for a refill of medication for a persistent rash and follow-up on a toe amputation.  Pruritic rash - Persistent rash localized to legs and groin area - Significant pruritus - Believes rash onset is related to antibiotics received after recent hospitalization - Requests medication refill to manage pruritus  Postoperative status following toe amputation - Underwent toe amputation on August 24, 2024 - Ongoing pain at the amputation site - Experiences phantom limb sensation in the area of the removed toe - Self-managing wound care due to lack of follow-up - Concerned about cost of wound care supplies - Bandage changed at a different location, but no further follow-up arranged - Not currently seeing a podiatrist due to inability to arrange transportation to [facility] - Frustration with lack of communication and follow-up from podiatrist's office  Upper extremity edema - Swelling in arm following IV placement during recent hospital stay - IV was initially obstructed and required adjustment - Swelling has not completely resolved  Mobility impairment and fall risk - Lives alone - Difficulty with mobility - Falls if leaning forward excessively - Managing care independently with challenges due to financial constraints and lack of support     Patient Active Problem List   Diagnosis Date Noted   Visit for suture removal 09/20/2024   Chest pain of uncertain etiology 01/31/2024   Pleurisy 03/02/2023   Infection of toe 10/04/2022    Aortic valve stenosis 10/04/2022   BMI 29.0-29.9,adult 08/03/2022   HLD (hyperlipidemia) 07/28/2022   Osteomyelitis (HCC) 06/19/2021   History of sepsis 06/02/2021   Anemia 06/02/2021   Acquired absence of left great toe 05/08/2021   Afib (HCC) 04/24/2021   CKD (chronic kidney disease) stage 3, GFR 30-59 ml/min (HCC) 04/24/2021   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 09/13/2020   Peripheral artery disease    COPD exacerbation (HCC) 05/14/2019   DDD (degenerative disc disease), cervical 11/06/2018   Generalized anxiety disorder 11/29/2017   Benign prostatic hyperplasia with incomplete bladder emptying 11/28/2017   Gastroesophageal reflux disease without esophagitis 09/07/2017   Essential hypertension 09/01/2017   Onychomycosis 09/01/2017   History of cardioversion 08/28/2015   Type 2 diabetes mellitus with diabetic neuropathy, with long-term current use of insulin  (HCC) 06/19/2014    ROS    Objective:     BP 129/67   Pulse 90   Ht 5' 9 (1.753 m)   Wt 224 lb 1.9 oz (101.7 kg)   SpO2 94%   BMI 33.10 kg/m  BP Readings from Last 3 Encounters:  09/19/24 129/67  09/04/24 130/79  08/29/24 138/80   Wt Readings from Last 3 Encounters:  09/19/24 224 lb 1.9 oz (101.7 kg)  09/04/24 224 lb (101.6 kg)  08/29/24 224 lb (101.6 kg)     Physical Exam Vitals and nursing note reviewed.  Constitutional:      Appearance: Normal appearance.  HENT:     Head: Normocephalic.  Eyes:     Extraocular Movements: Extraocular  movements intact.     Pupils: Pupils are equal, round, and reactive to light.  Cardiovascular:     Rate and Rhythm: Normal rate and regular rhythm.  Pulmonary:     Effort: Pulmonary effort is normal.     Breath sounds: Normal breath sounds.  Musculoskeletal:     Cervical back: Normal range of motion and neck supple.  Feet:     Comments: All toes on left foot amputated.  Sutures from recent  amputation of 4th & 5th metatarsal in place.  No erythema or  drainage present.  All sutures were removed during visit.  Neurological:     Mental Status: He is alert and oriented to person, place, and time.  Psychiatric:        Mood and Affect: Mood normal.        Thought Content: Thought content normal.      No results found for any visits on 09/19/24.    The ASCVD Risk score (Arnett DK, et al., 2019) failed to calculate for the following reasons:   Risk score cannot be calculated because patient has a medical history suggesting prior/existing ASCVD   * - Cholesterol units were assumed    Assessment & Plan:   Problem List Items Addressed This Visit       Musculoskeletal and Integument   Osteomyelitis (HCC) - Primary    Postoperative wound care after left toe amputation and osteomyelitis Sutures have been in place for 3 weeks.  No podiatrist follow-up due to transportation problems. - Removed sutures.  Patient tolerated well. - Referred to Home Health for wound care and supplies. - Provided information on medical transportation services for podiatrist follow-up.      Relevant Orders   Ambulatory referral to Home Health     Other   Visit for suture removal    Postoperative wound care after left toe amputation and osteomyelitis Sutures have been in place for 3 weeks.  No podiatrist follow-up due to transportation problems. - Removed sutures.  Patient tolerated well. - Referred to Home Health for wound care and supplies. - Provided information on medical transportation services for podiatrist follow-up.      Other Visit Diagnoses       Adverse effect of drug, subsequent encounter       Persistent rash likely due to antibiotics, causing itching and discomfort. - Refilled medication for skin eruption and sent prescription to [facility].   Relevant Medications   hydrOXYzine  (ATARAX ) 10 MG tablet     Status post amputation of lesser toe of left foot       Relevant Orders   Ambulatory referral to Home Health       Return in  about 3 months (around 12/18/2024) for chronic follow-up with PCP.    Leita Longs, FNP  "

## 2024-09-20 DIAGNOSIS — Z4802 Encounter for removal of sutures: Secondary | ICD-10-CM | POA: Insufficient documentation

## 2024-09-20 NOTE — Assessment & Plan Note (Signed)
"   Postoperative wound care after left toe amputation and osteomyelitis Sutures have been in place for 3 weeks.  No podiatrist follow-up due to transportation problems. - Removed sutures.  Patient tolerated well. - Referred to Home Health for wound care and supplies. - Provided information on medical transportation services for podiatrist follow-up. "

## 2024-10-01 ENCOUNTER — Telehealth: Payer: Self-pay

## 2024-10-01 NOTE — Telephone Encounter (Signed)
 Verbal orders provided to tina

## 2024-10-01 NOTE — Telephone Encounter (Signed)
 Copied from CRM [id number]. Topic: Clinical - Home Health Verbal Orders >> Oct 01, 2024  1:34 PM Corin V wrote: Caller/Agency: Ellouise, RN- [facility] Callback Number: [phone] Service Requested: Skilled Nursing Frequency: once per week for 4 weeks then once every other week for 4 weeks Any new concerns about the patient? Yes- she admitted patient today, but is uncertain if he will continue to accept services longterm

## 2024-10-04 ENCOUNTER — Other Ambulatory Visit: Payer: Self-pay | Admitting: Family Medicine

## 2024-10-04 DIAGNOSIS — E1140 Type 2 diabetes mellitus with diabetic neuropathy, unspecified: Secondary | ICD-10-CM

## 2024-12-20 ENCOUNTER — Ambulatory Visit: Payer: Self-pay
